# Patient Record
Sex: Female | Born: 1940
Health system: Southern US, Community
[De-identification: ages and names within clinical notes are randomized; demographics above are authoritative.]

## PROBLEM LIST (undated history)

## (undated) DIAGNOSIS — G8929 Other chronic pain: Secondary | ICD-10-CM

## (undated) DIAGNOSIS — N951 Menopausal and female climacteric states: Secondary | ICD-10-CM

## (undated) DIAGNOSIS — Z789 Other specified health status: Secondary | ICD-10-CM

## (undated) DIAGNOSIS — M25511 Pain in right shoulder: Secondary | ICD-10-CM

## (undated) DIAGNOSIS — M1990 Unspecified osteoarthritis, unspecified site: Secondary | ICD-10-CM

## (undated) DIAGNOSIS — M47812 Spondylosis without myelopathy or radiculopathy, cervical region: Secondary | ICD-10-CM

## (undated) DIAGNOSIS — N3281 Overactive bladder: Secondary | ICD-10-CM

## (undated) DIAGNOSIS — E785 Hyperlipidemia, unspecified: Secondary | ICD-10-CM

## (undated) DIAGNOSIS — I10 Essential (primary) hypertension: Secondary | ICD-10-CM

## (undated) DIAGNOSIS — K219 Gastro-esophageal reflux disease without esophagitis: Secondary | ICD-10-CM

## (undated) DIAGNOSIS — I8390 Asymptomatic varicose veins of unspecified lower extremity: Secondary | ICD-10-CM

## (undated) DIAGNOSIS — I69359 Hemiplegia and hemiparesis following cerebral infarction affecting unspecified side: Secondary | ICD-10-CM

## (undated) HISTORY — DX: Hyperlipidemia, unspecified: E78.5

## (undated) HISTORY — DX: Essential (primary) hypertension: I10

## (undated) HISTORY — DX: Hemiplegia and hemiparesis following cerebral infarction affecting unspecified side: I69.359

## (undated) HISTORY — PX: CATARACT EXTRACTION, BILATERAL: SHX1313

## (undated) HISTORY — DX: Menopausal and female climacteric states: N95.1

## (undated) HISTORY — DX: Overactive bladder: N32.81

## (undated) HISTORY — PX: BAND HEMORRHOIDECTOMY: SHX1213

## (undated) HISTORY — PX: CHOLECYSTECTOMY: SHX55

## (undated) HISTORY — PX: PAROTIDECTOMY: SHX2163

## (undated) HISTORY — PX: ABDOMINAL HYSTERECTOMY: SUR658

## (undated) HISTORY — DX: Other specified health status: Z78.9

---

## 1999-03-13 ENCOUNTER — Ambulatory Visit (HOSPITAL_COMMUNITY): Admission: RE | Admit: 1999-03-13 | Discharge: 1999-03-13 | Payer: Self-pay | Admitting: Obstetrics & Gynecology

## 2000-02-06 ENCOUNTER — Other Ambulatory Visit: Admission: RE | Admit: 2000-02-06 | Discharge: 2000-02-06 | Payer: Self-pay | Admitting: Obstetrics and Gynecology

## 2000-04-24 ENCOUNTER — Encounter: Admission: RE | Admit: 2000-04-24 | Discharge: 2000-04-24 | Payer: Self-pay | Admitting: Emergency Medicine

## 2000-04-24 ENCOUNTER — Encounter: Payer: Self-pay | Admitting: Emergency Medicine

## 2000-09-05 ENCOUNTER — Encounter: Payer: Self-pay | Admitting: Emergency Medicine

## 2000-09-05 ENCOUNTER — Emergency Department (HOSPITAL_COMMUNITY): Admission: EM | Admit: 2000-09-05 | Discharge: 2000-09-05 | Payer: Self-pay | Admitting: Emergency Medicine

## 2000-09-08 ENCOUNTER — Encounter: Admission: RE | Admit: 2000-09-08 | Discharge: 2000-09-08 | Payer: Self-pay | Admitting: Emergency Medicine

## 2000-09-08 ENCOUNTER — Encounter: Payer: Self-pay | Admitting: Emergency Medicine

## 2001-05-01 ENCOUNTER — Encounter: Admission: RE | Admit: 2001-05-01 | Discharge: 2001-05-01 | Payer: Self-pay | Admitting: Emergency Medicine

## 2001-05-01 ENCOUNTER — Encounter: Payer: Self-pay | Admitting: Emergency Medicine

## 2001-06-01 ENCOUNTER — Ambulatory Visit (HOSPITAL_BASED_OUTPATIENT_CLINIC_OR_DEPARTMENT_OTHER): Admission: RE | Admit: 2001-06-01 | Discharge: 2001-06-02 | Payer: Self-pay | Admitting: *Deleted

## 2002-03-09 ENCOUNTER — Other Ambulatory Visit: Admission: RE | Admit: 2002-03-09 | Discharge: 2002-03-09 | Payer: Self-pay | Admitting: Obstetrics and Gynecology

## 2002-04-02 ENCOUNTER — Encounter: Admission: RE | Admit: 2002-04-02 | Discharge: 2002-04-02 | Payer: Self-pay | Admitting: Emergency Medicine

## 2002-04-02 ENCOUNTER — Encounter: Payer: Self-pay | Admitting: Emergency Medicine

## 2002-05-04 ENCOUNTER — Encounter: Admission: RE | Admit: 2002-05-04 | Discharge: 2002-05-04 | Payer: Self-pay | Admitting: Emergency Medicine

## 2002-05-04 ENCOUNTER — Encounter: Payer: Self-pay | Admitting: Emergency Medicine

## 2002-11-18 LAB — HM COLONOSCOPY: HM Colonoscopy: NORMAL

## 2003-05-06 ENCOUNTER — Encounter: Payer: Self-pay | Admitting: Emergency Medicine

## 2003-05-06 ENCOUNTER — Encounter: Admission: RE | Admit: 2003-05-06 | Discharge: 2003-05-06 | Payer: Self-pay | Admitting: Emergency Medicine

## 2003-08-12 ENCOUNTER — Ambulatory Visit (HOSPITAL_COMMUNITY): Admission: RE | Admit: 2003-08-12 | Discharge: 2003-08-12 | Payer: Self-pay | Admitting: Gastroenterology

## 2003-11-28 ENCOUNTER — Encounter: Admission: RE | Admit: 2003-11-28 | Discharge: 2003-11-28 | Payer: Self-pay | Admitting: Emergency Medicine

## 2004-05-07 ENCOUNTER — Encounter: Admission: RE | Admit: 2004-05-07 | Discharge: 2004-05-07 | Payer: Self-pay | Admitting: Emergency Medicine

## 2005-05-08 ENCOUNTER — Encounter: Admission: RE | Admit: 2005-05-08 | Discharge: 2005-05-08 | Payer: Self-pay | Admitting: Emergency Medicine

## 2005-10-09 ENCOUNTER — Encounter: Admission: RE | Admit: 2005-10-09 | Discharge: 2005-10-09 | Payer: Self-pay | Admitting: Emergency Medicine

## 2006-05-09 ENCOUNTER — Encounter: Admission: RE | Admit: 2006-05-09 | Discharge: 2006-05-09 | Payer: Self-pay | Admitting: Emergency Medicine

## 2007-05-11 ENCOUNTER — Encounter: Admission: RE | Admit: 2007-05-11 | Discharge: 2007-05-11 | Payer: Self-pay | Admitting: Emergency Medicine

## 2008-05-11 ENCOUNTER — Encounter: Admission: RE | Admit: 2008-05-11 | Discharge: 2008-05-11 | Payer: Self-pay | Admitting: Emergency Medicine

## 2008-05-11 ENCOUNTER — Encounter: Payer: Self-pay | Admitting: Internal Medicine

## 2008-08-22 ENCOUNTER — Ambulatory Visit (HOSPITAL_COMMUNITY): Admission: RE | Admit: 2008-08-22 | Discharge: 2008-08-22 | Payer: Self-pay | Admitting: General Surgery

## 2008-11-28 ENCOUNTER — Ambulatory Visit: Payer: Self-pay | Admitting: Internal Medicine

## 2008-11-28 DIAGNOSIS — E7800 Pure hypercholesterolemia, unspecified: Secondary | ICD-10-CM | POA: Insufficient documentation

## 2008-11-28 DIAGNOSIS — N951 Menopausal and female climacteric states: Secondary | ICD-10-CM | POA: Insufficient documentation

## 2009-05-12 ENCOUNTER — Encounter: Admission: RE | Admit: 2009-05-12 | Discharge: 2009-05-12 | Payer: Self-pay | Admitting: Obstetrics and Gynecology

## 2009-05-15 ENCOUNTER — Encounter: Payer: Self-pay | Admitting: Internal Medicine

## 2009-05-16 ENCOUNTER — Ambulatory Visit: Payer: Self-pay | Admitting: Internal Medicine

## 2009-05-16 LAB — CONVERTED CEMR LAB
ALT: 18 units/L (ref 0–35)
AST: 31 units/L (ref 0–37)
Albumin: 4.5 g/dL (ref 3.5–5.2)
Alkaline Phosphatase: 55 units/L (ref 39–117)
BUN: 9 mg/dL (ref 6–23)
Basophils Absolute: 0 10*3/uL (ref 0.0–0.1)
Basophils Relative: 0.1 % (ref 0.0–3.0)
Bilirubin, Direct: 0.1 mg/dL (ref 0.0–0.3)
CO2: 31 meq/L (ref 19–32)
Calcium: 9.8 mg/dL (ref 8.4–10.5)
Chloride: 105 meq/L (ref 96–112)
Cholesterol: 168 mg/dL (ref 0–200)
Creatinine, Ser: 0.9 mg/dL (ref 0.4–1.2)
Eosinophils Absolute: 0.2 10*3/uL (ref 0.0–0.7)
Eosinophils Relative: 3.7 % (ref 0.0–5.0)
GFR calc non Af Amer: 79.61 mL/min (ref >60)
Glucose, Bld: 89 mg/dL (ref 70–99)
HCT: 41.2 % (ref 36.0–46.0)
HDL: 59.8 mg/dL (ref >39.00)
Hemoglobin: 14.3 g/dL (ref 12.0–15.0)
LDL Cholesterol: 98 mg/dL (ref 0–99)
Lymphocytes Relative: 41.4 % (ref 12.0–46.0)
Lymphs Abs: 1.7 10*3/uL (ref 0.7–4.0)
MCHC: 34.6 g/dL (ref 30.0–36.0)
MCV: 78.6 fL (ref 78.0–100.0)
Monocytes Absolute: 0.5 10*3/uL (ref 0.1–1.0)
Monocytes Relative: 10.8 % (ref 3.0–12.0)
Neutro Abs: 1.8 10*3/uL (ref 1.4–7.7)
Neutrophils Relative %: 44 % (ref 43.0–77.0)
Platelets: 197 10*3/uL (ref 150.0–400.0)
Potassium: 4.9 meq/L (ref 3.5–5.1)
RBC: 5.24 M/uL — ABNORMAL HIGH (ref 3.87–5.11)
RDW: 13.8 % (ref 11.5–14.6)
Sodium: 144 meq/L (ref 135–145)
TSH: 0.81 microintl units/mL (ref 0.35–5.50)
Total Bilirubin: 0.9 mg/dL (ref 0.3–1.2)
Total CHOL/HDL Ratio: 3
Total Protein: 7.3 g/dL (ref 6.0–8.3)
Triglycerides: 50 mg/dL (ref 0.0–149.0)
VLDL: 10 mg/dL (ref 0.0–40.0)
WBC: 4.2 10*3/uL — ABNORMAL LOW (ref 4.5–10.5)

## 2009-05-24 ENCOUNTER — Telehealth: Payer: Self-pay | Admitting: Internal Medicine

## 2009-08-14 ENCOUNTER — Encounter: Payer: Self-pay | Admitting: Internal Medicine

## 2009-09-15 ENCOUNTER — Ambulatory Visit: Payer: Self-pay | Admitting: Internal Medicine

## 2009-09-15 DIAGNOSIS — G4700 Insomnia, unspecified: Secondary | ICD-10-CM | POA: Insufficient documentation

## 2009-10-10 ENCOUNTER — Ambulatory Visit: Payer: Self-pay | Admitting: Internal Medicine

## 2009-10-10 DIAGNOSIS — I10 Essential (primary) hypertension: Secondary | ICD-10-CM | POA: Insufficient documentation

## 2010-01-25 ENCOUNTER — Ambulatory Visit: Payer: Self-pay | Admitting: Internal Medicine

## 2010-05-14 ENCOUNTER — Encounter: Admission: RE | Admit: 2010-05-14 | Discharge: 2010-05-14 | Payer: Self-pay | Admitting: Internal Medicine

## 2010-07-12 ENCOUNTER — Ambulatory Visit: Payer: Self-pay | Admitting: Internal Medicine

## 2010-07-12 LAB — CONVERTED CEMR LAB
ALT: 18 units/L (ref 0–35)
AST: 24 units/L (ref 0–37)
Albumin: 4.4 g/dL (ref 3.5–5.2)
Alkaline Phosphatase: 47 units/L (ref 39–117)
BUN: 16 mg/dL (ref 6–23)
Basophils Absolute: 0 10*3/uL (ref 0.0–0.1)
Basophils Relative: 0.4 % (ref 0.0–3.0)
Bilirubin, Direct: 0.1 mg/dL (ref 0.0–0.3)
CO2: 29 meq/L (ref 19–32)
Calcium: 9.9 mg/dL (ref 8.4–10.5)
Chloride: 104 meq/L (ref 96–112)
Cholesterol: 238 mg/dL — ABNORMAL HIGH (ref 0–200)
Creatinine, Ser: 0.8 mg/dL (ref 0.4–1.2)
Direct LDL: 165.5 mg/dL
Eosinophils Absolute: 0.2 10*3/uL (ref 0.0–0.7)
Eosinophils Relative: 3.3 % (ref 0.0–5.0)
GFR calc non Af Amer: 85.92 mL/min (ref >60)
Glucose, Bld: 91 mg/dL (ref 70–99)
HCT: 40.2 % (ref 36.0–46.0)
HDL: 60.1 mg/dL (ref >39.00)
Hemoglobin: 13.3 g/dL (ref 12.0–15.0)
Lymphocytes Relative: 33.1 % (ref 12.0–46.0)
Lymphs Abs: 2.2 10*3/uL (ref 0.7–4.0)
MCHC: 33 g/dL (ref 30.0–36.0)
MCV: 82.9 fL (ref 78.0–100.0)
Monocytes Absolute: 0.6 10*3/uL (ref 0.1–1.0)
Monocytes Relative: 8.7 % (ref 3.0–12.0)
Neutro Abs: 3.6 10*3/uL (ref 1.4–7.7)
Neutrophils Relative %: 54.5 % (ref 43.0–77.0)
Platelets: 199 10*3/uL (ref 150.0–400.0)
Potassium: 4 meq/L (ref 3.5–5.1)
RBC: 4.85 M/uL (ref 3.87–5.11)
RDW: 14.9 % — ABNORMAL HIGH (ref 11.5–14.6)
Sodium: 139 meq/L (ref 135–145)
TSH: 0.72 microintl units/mL (ref 0.35–5.50)
Total Bilirubin: 0.7 mg/dL (ref 0.3–1.2)
Total CHOL/HDL Ratio: 4
Total Protein: 6.8 g/dL (ref 6.0–8.3)
Triglycerides: 70 mg/dL (ref 0.0–149.0)
VLDL: 14 mg/dL (ref 0.0–40.0)
WBC: 6.6 10*3/uL (ref 4.5–10.5)

## 2010-08-09 ENCOUNTER — Ambulatory Visit (HOSPITAL_BASED_OUTPATIENT_CLINIC_OR_DEPARTMENT_OTHER): Admission: RE | Admit: 2010-08-09 | Discharge: 2010-08-09 | Payer: Self-pay | Admitting: Internal Medicine

## 2010-08-09 ENCOUNTER — Encounter: Payer: Self-pay | Admitting: Internal Medicine

## 2010-08-14 ENCOUNTER — Ambulatory Visit: Payer: Self-pay | Admitting: Pulmonary Disease

## 2010-08-17 LAB — HM PAP SMEAR: HM Pap smear: NORMAL

## 2010-09-26 ENCOUNTER — Telehealth: Payer: Self-pay | Admitting: Internal Medicine

## 2010-10-29 ENCOUNTER — Ambulatory Visit: Payer: Self-pay | Admitting: Internal Medicine

## 2010-10-29 DIAGNOSIS — M542 Cervicalgia: Secondary | ICD-10-CM | POA: Insufficient documentation

## 2010-11-26 ENCOUNTER — Ambulatory Visit
Admission: RE | Admit: 2010-11-26 | Discharge: 2010-11-26 | Payer: Self-pay | Source: Home / Self Care | Attending: Internal Medicine | Admitting: Internal Medicine

## 2010-11-26 DIAGNOSIS — J069 Acute upper respiratory infection, unspecified: Secondary | ICD-10-CM | POA: Insufficient documentation

## 2010-12-18 NOTE — Progress Notes (Signed)
Summary: Blood Pressure Readings taken at home  Blood Pressure Readings taken at home   Imported By: Maryln Gottron 01/29/2010 15:41:03  _____________________________________________________________________  External Attachment:    Type:   Image     Comment:   External Document

## 2010-12-18 NOTE — Progress Notes (Signed)
Summary: sleep study results  Phone Note Call from Patient   Caller: Patient Call For: Teresa Savers  MD Summary of Call: Pt is calling for lab and sleep study results. [phone] Initial call taken by: Lynann Beaver CMA AAMA,  September 26, 2010 10:08 AM  Follow-up for Phone Call        lab ok ; sleep study- very mild OSA only Follow-up by: Teresa Savers  MD,  September 26, 2010 11:50 AM  Additional Follow-up for Phone Call Additional follow up Details #1::        called pt - informe of sleep study results per Dr. Amador Cunas Additional Follow-up by: Duard Brady LPN,  September 26, 2010 12:28 PM

## 2010-12-18 NOTE — Assessment & Plan Note (Signed)
Summary: CPX (PT WILL COME IN FASTING) // RS/PT RSC/CJR   Vital Signs:  Martinez profile:   70 year old female Height:      65.5 inches Weight:      138 pounds BMI:     22.70 Temp:     98.2 degrees F oral BP sitting:   120 / 72  (left arm) Cuff size:   regular  Vitals Entered By: Kathrynn Speed CMA (July 12, 2010 8:50 AM) CC: cxp, fasting, src Is Martinez Diabetic? No   CC:  cxp, fasting, and src.  History of Present Illness: Teresa Martinez seen today for a comprehensive evaluation.  She is followed annually by gynecology.  She has a history of hypertension overactive bladder and mild dyslipidemia.  Her last colonoscopy was in 2005 ; she has had a recent mammogram.  Here for Medicare AWV:  1.   Risk factors based on Past M, S, F history:  cardiovascular risk factors include hypertension, and dyslipidemia. 2.   Physical Activities:  remains active without limitations 3.   Depression/mood: no history of depression or mood disorder 4.   Hearing: no deficits 5.   ADL's: completely active and independent in all aspects of daily living 6.   Fall Risk: low 7.   Home Safety: no problems  identifying 8.   Height, weight, &visual acuity:height and weight stable.  No difficulty with her visual acuity wears glasses 9.   Counseling: gynecologic follow-up encouraged 10.   Labs ordered based on risk factors: laboratory profile, including lipid panel will be reviewed 11.           Referral Coordination- GYN referral 12.           Care Plan- heart healthy diet more regular exercise regimen encouraged 13.            Cognitive Assessment- alert and oriented with normal affect.  No mood disorder, handles all her daily affairs, and no difficulty with executive function or memory.  No functional deficits   Current Medications (verified): 1)  Lisinopril-Hydrochlorothiazide 20-12.5 Mg Tabs (Lisinopril-Hydrochlorothiazide) .... One Daily 2)  Toviaz 4 Mg Xr24h-Tab (Fesoterodine Fumarate) ....  One Daily, As Needed  Allergies (verified): 1)  ! Latex Exam Gloves (Disposable Gloves)  Past History:  Past Medical History: Reviewed history from 01/25/2010 and no changes required. Hyperlipidemia statin intolerance menopausal syndrome Floyde Parkins) hypertension overactive bladder  Past Surgical History: Reviewed history from 05/16/2009 and no changes required. Cholecystectomy  1950 Hemorrhoidectomy -band ligation 2010 status post parotidectomy 30 years ago Colonoscopy 2005 Hysterectomy  1985  Family History: Reviewed history from 11/28/2008 and no changes required. father died age 44.  History congestive heart failure mother died age 65.  History diabetes, hypertension, cerebrovascular disease  Two sisters, one died from nonalcoholic cirrhosis two brothers, one with a hypercholesterolemia, and hypertension; s/p CVA  Social History: Reviewed history from 11/28/2008 and no changes required. Married Never Smoked Regular exercise-yes 4 children, 6 grandchildren one stepchild and two additional stepgrandchildren  Review of Systems  The Martinez denies anorexia, fever, weight loss, weight gain, vision loss, decreased hearing, hoarseness, chest pain, syncope, dyspnea on exertion, peripheral edema, prolonged cough, headaches, hemoptysis, abdominal pain, melena, hematochezia, severe indigestion/heartburn, hematuria, incontinence, genital sores, muscle weakness, suspicious skin lesions, transient blindness, difficulty walking, depression, unusual weight change, abnormal bleeding, enlarged lymph nodes, angioedema, and breast masses.    Physical Exam  General:  Well-developed,well-nourished,in no acute distress; alert,appropriate and cooperative throughout examination Head:  Normocephalic and atraumatic  without obvious abnormalities. No apparent alopecia or balding. Eyes:  No corneal or conjunctival inflammation noted. EOMI. Perrla. Funduscopic exam benign, without  hemorrhages, exudates or papilledema. Vision grossly normal. Ears:  External ear exam shows no significant lesions or deformities.  Otoscopic examination reveals clear canals, tympanic membranes are intact bilaterally without bulging, retraction, inflammation or discharge. Hearing is grossly normal bilaterally. Nose:  External nasal examination shows no deformity or inflammation. Nasal mucosa are pink and moist without lesions or exudates. Mouth:  Oral mucosa and oropharynx without lesions or exudates.  Teeth in good repair. Neck:  No deformities, masses, or tenderness noted. Chest Wall:  No deformities, masses, or tenderness noted. Breasts:  No mass, nodules, thickening, tenderness, bulging, retraction, inflamation, nipple discharge or skin changes noted.   Lungs:  Normal respiratory effort, chest expands symmetrically. Lungs are clear to auscultation, no crackles or wheezes. Heart:  Normal rate and regular rhythm. S1 and S2 normal without gallop, murmur, click, rub or other extra sounds. Abdomen:  Bowel sounds positive,abdomen soft and non-tender without masses, organomegaly or hernias noted. Msk:  No deformity or scoliosis noted of thoracic or lumbar spine.   Pulses:  the left dorsalis pedis pulse diminished Extremities:  No clubbing, cyanosis, edema, or deformity noted with normal full range of motion of all joints.   Neurologic:  No cranial nerve deficits noted. Station and gait are normal. Plantar reflexes are down-going bilaterally. DTRs are symmetrical throughout. Sensory, motor and coordinative functions appear intact. Skin:  Intact without suspicious lesions or rashes Cervical Nodes:  No lymphadenopathy noted Axillary Nodes:  No palpable lymphadenopathy Inguinal Nodes:  No significant adenopathy Psych:  Cognition and judgment appear intact. Alert and cooperative with normal attention span and concentration. No apparent delusions, illusions, hallucinations   Impression &  Recommendations:  Problem # 1:  PREVENTIVE HEALTH CARE (ICD-V70.0)  Orders: Medicare -1st Annual Wellness Visit [phone])  Complete Medication List: 1)  Lisinopril-hydrochlorothiazide 20-12.5 Mg Tabs (Lisinopril-hydrochlorothiazide) .... One daily 2)  Toviaz 4 Mg Xr24h-tab (Fesoterodine fumarate) .... One daily, as needed  Other Orders: Venipuncture (60454) TLB-Lipid Panel (80061-LIPID) TLB-BMP (Basic Metabolic Panel-BMET) (80048-METABOL) TLB-CBC Platelet - w/Differential (85025-CBCD) TLB-Hepatic/Liver Function Pnl (80076-HEPATIC) TLB-TSH (Thyroid Stimulating Hormone) (84443-TSH) Sleep Disorder Referral (Sleep Disorder) Specimen Handling (09811)  Martinez Instructions: 1)  Please schedule a follow-up appointment in 1 year. 2)  Limit your Sodium (Salt) to less than 2 grams a day(slightly less than 1/2 a teaspoon) to prevent fluid retention, swelling, or worsening of symptoms. 3)  It is important that you exercise regularly at least 20 minutes 5 times a week. If you develop chest pain, have severe difficulty breathing, or feel very tired , stop exercising immediately and seek medical attention. 4)  Take calcium +Vitamin D daily. 5)  Check your Blood Pressure regularly. If it is above: you should make an appointment. Prescriptions: TOVIAZ 4 MG XR24H-TAB (FESOTERODINE FUMARATE) one daily, as needed  #50 x 6   Entered and Authorized by:   Gordy Savers  MD   Signed by:   Gordy Savers  MD on 07/12/2010   Method used:   Print then Give to Martinez   RxID:   [id number] LISINOPRIL-HYDROCHLOROTHIAZIDE 20-12.5 MG TABS (LISINOPRIL-HYDROCHLOROTHIAZIDE) one daily  #90 x 6   Entered and Authorized by:   Gordy Savers  MD   Signed by:   Gordy Savers  MD on 07/12/2010   Method used:   Print then Give to Martinez   RxID:   [id number]

## 2010-12-18 NOTE — Assessment & Plan Note (Signed)
Summary: 3 month follow up re: bp meds/cjr   Vital Signs:  Patient profile:   70 year old female Weight:      143 pounds Temp:     98.8 degrees F oral BP sitting:   132 / 82  (right arm) Cuff size:   regular  Vitals Entered By: Duard Brady LPN (January 25, 2010 3:56 PM) CC: med review - bp monitoring , likes benicar/hct , and Gala Murdoch - needs rx for both    CC:  med review - bp monitoring , likes benicar/hct , and and Gala Murdoch - needs rx for both .  History of Present Illness: a 70 year old patient who is in today for follow-up of her hypertension.she is doing quite well without concerns or complaints.  She does have an overactive bladder syndrome and does quite well on p.r.n. medication.  No concerns or complaints today.  She denies any cardiopulmonary complaints  Allergies: 1)  ! Latex Exam Gloves (Disposable Gloves)  Past History:  Past Medical History: Hyperlipidemia statin intolerance menopausal syndrome Floyde Parkins) hypertension overactive bladder  Review of Systems  The patient denies anorexia, fever, weight loss, weight gain, vision loss, decreased hearing, hoarseness, chest pain, syncope, dyspnea on exertion, peripheral edema, prolonged cough, headaches, hemoptysis, abdominal pain, melena, hematochezia, severe indigestion/heartburn, hematuria, incontinence, genital sores, muscle weakness, suspicious skin lesions, transient blindness, difficulty walking, depression, unusual weight change, abnormal bleeding, enlarged lymph nodes, angioedema, and breast masses.    Physical Exam  General:  Well-developed,well-nourished,in no acute distress; alert,appropriate and cooperative throughout examination; 130/70 Head:  Normocephalic and atraumatic without obvious abnormalities. No apparent alopecia or balding. Eyes:  No corneal or conjunctival inflammation noted. EOMI. Perrla. Funduscopic exam benign, without hemorrhages, exudates or papilledema. Vision grossly  normal. Mouth:  Oral mucosa and oropharynx without lesions or exudates.  Teeth in good repair. Neck:  No deformities, masses, or tenderness noted. Lungs:  Normal respiratory effort, chest expands symmetrically. Lungs are clear to auscultation, no crackles or wheezes. Heart:  Normal rate and regular rhythm. S1 and S2 normal without gallop, murmur, click, rub or other extra sounds. Abdomen:  Bowel sounds positive,abdomen soft and non-tender without masses, organomegaly or hernias noted.   Impression & Recommendations:  Problem # 1:  HYPERTENSION NEC (ICD-997.91)  Problem # 2:  INSOMNIA (ICD-780.52)  Problem # 3:  HYPERLIPIDEMIA (ICD-272.4)  Complete Medication List: 1)  Lorazepam 0.5 Mg Tabs (Lorazepam) .... One twice daily as needed 2)  Lisinopril-hydrochlorothiazide 20-12.5 Mg Tabs (Lisinopril-hydrochlorothiazide) .... One daily 3)  Toviaz 4 Mg Xr24h-tab (Fesoterodine fumarate) .... One daily, as needed  Patient Instructions: 1)  Please schedule a follow-up appointment in 6 months for CPX 2)  Limit your Sodium (Salt). 3)  It is important that you exercise regularly at least 20 minutes 5 times a week. If you develop chest pain, have severe difficulty breathing, or feel very tired , stop exercising immediately and seek medical attention. 4)  Check your Blood Pressure regularly. If it is above: 150/90  you should make an appointment. Prescriptions: TOVIAZ 4 MG XR24H-TAB (FESOTERODINE FUMARATE) one daily, as needed  #50 x 4   Entered and Authorized by:   Gordy Savers  MD   Signed by:   Gordy Savers  MD on 01/25/2010   Method used:   Print then Give to Patient   RxID:   [id number] LISINOPRIL-HYDROCHLOROTHIAZIDE 20-12.5 MG TABS (LISINOPRIL-HYDROCHLOROTHIAZIDE) one daily  #90 x 4   Entered and Authorized by:   Gordy Savers  MD   Signed by:   Gordy Savers  MD on 01/25/2010   Method used:   Print then Give to Patient   RxID:   [id number] LORAZEPAM  0.5 MG TABS (LORAZEPAM) one twice daily as needed  #50 x 4   Entered and Authorized by:   Gordy Savers  MD   Signed by:   Gordy Savers  MD on 01/25/2010   Method used:   Print then Give to Patient   RxID:   [id number]
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
## 2010-12-20 NOTE — Assessment & Plan Note (Signed)
Summary: consult re: dry cough for over a wk and neck pain/cjr   Vital Signs:  Patient profile:   70 year old female Weight:      140 pounds Temp:     98.4 degrees F oral BP sitting:   140 / 80  (left arm) Cuff size:   regular  Vitals Entered By: Duard Brady LPN (November 26, 2010 1:13 PM) CC: cough x1wk , chest discomfort r/t cough - no fever Is Patient Diabetic? No   CC:  cough x1wk  and chest discomfort r/t cough - no fever.  History of Present Illness: 70 year old patient who presents with a one week history of dry, nonproductive cough, chest congestion, tightness, and over the past two days hoarseness.  There is been no shortness or breath, fever, chills, or purulent sputum production. Her posterior neck pain has improved  Allergies: 1)  ! Latex Exam Gloves (Disposable Gloves)  Past History:  Past Medical History: Reviewed history from 01/25/2010 and no changes required. Hyperlipidemia statin intolerance menopausal syndrome Teresa Martinez) hypertension overactive bladder  Review of Systems       The patient complains of anorexia, hoarseness, and prolonged cough.  The patient denies fever, weight loss, weight gain, vision loss, decreased hearing, chest pain, syncope, dyspnea on exertion, peripheral edema, headaches, hemoptysis, abdominal pain, melena, hematochezia, severe indigestion/heartburn, hematuria, incontinence, genital sores, muscle weakness, suspicious skin lesions, transient blindness, difficulty walking, depression, unusual weight change, abnormal bleeding, enlarged lymph nodes, angioedema, and breast masses.    Physical Exam  General:  Well-developed,well-nourished,in no acute distress; alert,appropriate and cooperative throughout examination Head:  Normocephalic and atraumatic without obvious abnormalities. No apparent alopecia or balding. Eyes:  No corneal or conjunctival inflammation noted. EOMI. Perrla. Funduscopic exam benign, without  hemorrhages, exudates or papilledema. Vision grossly normal. Ears:  right canal had some cerumen Nose:  External nasal examination shows no deformity or inflammation. Nasal mucosa are pink and moist without lesions or exudates. Mouth:  Oral mucosa and oropharynx without lesions or exudates.  Teeth in good repair. Neck:  No deformities, masses, or tenderness noted. Lungs:  Normal respiratory effort, chest expands symmetrically. Lungs are clear to auscultation, no crackles or wheezes. Heart:  Normal rate and regular rhythm. S1 and S2 normal without gallop, murmur, click, rub or other extra sounds.   Impression & Recommendations:  Problem # 1:  URI (ICD-465.9)  Problem # 2:  NECK PAIN, CHRONIC (ICD-723.1)  Her updated medication list for this problem includes:    Cyclobenzaprine Hcl 5 Mg Tabs (Cyclobenzaprine hcl) ..... One every 8 hours as needed for neck pain  Her updated medication list for this problem includes:    Cyclobenzaprine Hcl 5 Mg Tabs (Cyclobenzaprine hcl) ..... One every 8 hours as needed for neck pain  Complete Medication List: 1)  Lisinopril-hydrochlorothiazide 20-12.5 Mg Tabs (Lisinopril-hydrochlorothiazide) .... One daily 2)  Toviaz 4 Mg Xr24h-tab (Fesoterodine fumarate) .... One daily, as needed 3)  Advil  4)  Prednisone 10 Mg Tabs (Prednisone) .... One twice daily 5)  Cyclobenzaprine Hcl 5 Mg Tabs (Cyclobenzaprine hcl) .... One every 8 hours as needed for neck pain  Patient Instructions: 1)  Get plenty of rest, drink lots of clear liquids, and use Tylenol or Ibuprofen for fever and comfort. Return in 7-10 days if you're not better:sooner if you're feeling worse.   Orders Added: 1)  Est. Patient Level III [29562]

## 2011-01-11 ENCOUNTER — Telehealth: Payer: Self-pay | Admitting: Internal Medicine

## 2011-01-13 ENCOUNTER — Encounter: Payer: Self-pay | Admitting: Internal Medicine

## 2011-01-14 ENCOUNTER — Ambulatory Visit (INDEPENDENT_AMBULATORY_CARE_PROVIDER_SITE_OTHER): Payer: Medicare Other | Admitting: Internal Medicine

## 2011-01-14 ENCOUNTER — Encounter: Payer: Self-pay | Admitting: Internal Medicine

## 2011-01-14 VITALS — BP 110/70 | Temp 98.7°F | Wt 140.0 lb

## 2011-01-14 DIAGNOSIS — IMO0002 Reserved for concepts with insufficient information to code with codable children: Secondary | ICD-10-CM

## 2011-01-14 DIAGNOSIS — J40 Bronchitis, not specified as acute or chronic: Secondary | ICD-10-CM

## 2011-01-14 DIAGNOSIS — J069 Acute upper respiratory infection, unspecified: Secondary | ICD-10-CM

## 2011-01-14 MED ORDER — DOXYCYCLINE HYCLATE 100 MG PO TABS: 100.0000 mg | ORAL_TABLET | Freq: Two times a day (BID) | ORAL | Status: DC

## 2011-01-14 MED ORDER — HYDROCODONE-HOMATROPINE 5-1.5 MG/5ML PO SYRP: 5.0000 mL | ORAL_SOLUTION | Freq: Four times a day (QID) | ORAL | Status: DC | PRN

## 2011-01-14 MED ORDER — DOXYCYCLINE HYCLATE 100 MG PO TABS: 100.0000 mg | ORAL_TABLET | Freq: Two times a day (BID) | ORAL | Status: AC

## 2011-01-14 NOTE — Patient Instructions (Signed)
Get plenty of rest, Drink lots of  clear liquids, and use Tylenol or ibuprofen for fever and discomfort.    Take your antibiotic as prescribed until ALL of it is gone, but stop if you develop a rash, swelling, or any side effects of the medication.  Contact our office as soon as possible if  there are side effects of the medication.  Call or return to clinic prn if these symptoms worsen or fail to improve as anticipated.   

## 2011-01-14 NOTE — Progress Notes (Signed)
  Subjective:    Patient ID: Teresa Martinez, female    DOB: 23-Mar-1941, 70 y.o.   MRN: [id number]  HPI   70 year old patient who presents with a 10 day history of worsening head congestion chest congestion and productive cough. She has felt fatigued and unwell. For the past 3 days she has developed sore throat as well as right ear pain. She has been using a number of over the counter medications without much benefit. Sinus drainage has become quite purulent and is described as green and foul-smelling    Review of Systems  Constitutional: Positive for activity change and fatigue.  HENT: Positive for ear pain, congestion and rhinorrhea. Negative for hearing loss, sore throat, dental problem, sinus pressure and tinnitus.   Eyes: Negative for pain, discharge and visual disturbance.  Respiratory: Positive for cough. Negative for shortness of breath.   Cardiovascular: Negative for chest pain, palpitations and leg swelling.  Gastrointestinal: Negative for nausea, vomiting, abdominal pain, diarrhea, constipation, blood in stool and abdominal distention.  Genitourinary: Negative for dysuria, urgency, frequency, hematuria, flank pain, vaginal bleeding, vaginal discharge, difficulty urinating, vaginal pain and pelvic pain.  Musculoskeletal: Negative for joint swelling, arthralgias and gait problem.  Skin: Negative for rash.  Neurological: Negative for dizziness, syncope, speech difficulty, weakness, numbness and headaches.  Hematological: Negative for adenopathy.  Psychiatric/Behavioral: Negative for behavioral problems, dysphoric mood and agitation. The patient is not nervous/anxious.        Objective:   Physical Exam  Constitutional: She is oriented to person, place, and time. She appears well-developed and well-nourished.  HENT:  Head: Normocephalic.  Right Ear: External ear normal.  Left Ear: External ear normal.  Mouth/Throat: Oropharynx is clear and moist.        The right tympanic  membrane was not visualized secondary to cerumen. The left tympanic membrane was normal  Eyes: Conjunctivae and EOM are normal. Pupils are equal, round, and reactive to light.  Neck: Normal range of motion. Neck supple. No thyromegaly present.  Cardiovascular: Normal rate, regular rhythm, normal heart sounds and intact distal pulses.   Pulmonary/Chest: Effort normal and breath sounds normal.  Abdominal: Soft. Bowel sounds are normal. She exhibits no mass. There is no tenderness.  Musculoskeletal: Normal range of motion.  Lymphadenopathy:    She has no cervical adenopathy.  Neurological: She is alert and oriented to person, place, and time.  Skin: Skin is warm and dry. No rash noted.  Psychiatric: She has a normal mood and affect. Her behavior is normal.          Assessment & Plan:   acute sinusitis  Hypertension   We'll treat with 10 days of doxycycline therapy;  expectorants saline irrigation will be continued

## 2011-01-21 ENCOUNTER — Other Ambulatory Visit: Payer: Self-pay

## 2011-01-21 MED ORDER — HYDROCODONE-HOMATROPINE 5-1.5 MG/5ML PO SYRP: 5.0000 mL | ORAL_SOLUTION | Freq: Four times a day (QID) | ORAL | Status: AC | PRN

## 2011-01-21 NOTE — Telephone Encounter (Signed)
Faxed back to [facility]

## 2011-01-21 NOTE — Telephone Encounter (Signed)
recv'd fax from [facility] [address] For refill on hydromet   Seen in office 01/14/11 - was given at that time. Please advise

## 2011-02-25 ENCOUNTER — Other Ambulatory Visit: Payer: Self-pay | Admitting: Internal Medicine

## 2011-04-02 NOTE — Op Note (Signed)
NAME:  Teresa Martinez, Teresa Martinez      ACCOUNT NO.:  [id number]   MEDICAL RECORD NO.:  [id number]          PATIENT TYPE:  AMB   LOCATION:  SDS                          FACILITY:  [facility]   PHYSICIAN:  Ollen Gross. Vernell Morgans, M.D. DATE OF BIRTH:  13-Jun-1941   DATE OF PROCEDURE:  08/22/2008  DATE OF DISCHARGE:  08/22/2008                               OPERATIVE REPORT   PREOPERATIVE DIAGNOSIS:  Bleeding and internal hemorrhoids.   POSTOPERATIVE DIAGNOSIS:  Bleeding internal hemorrhoids.   PROCEDURE:  Internal hemorrhoid banding x2.   SURGEON:  Ollen Gross. Vernell Morgans, MD   ANESTHESIA:  General via LMA.   PROCEDURE:  After an informed consent was obtained, the patient was  brought to the operating placed in supine position on the operating room  table.  After adequate induction of general anesthesia, the patient was  placed in lithotomy position.  Her perirectal area was then prepped with  Betadine and draped in usual sterile manner.  The perirectal region was  then infiltrated with 0.25% Marcaine with epinephrine and 1 mL of Wydase  and the tissue was massaged gently for several minutes.  The patient had  a very patulous rectum with poor tone.  The bullet retractor was easily  inserted into the rectum.  The rectum was examined circumferentially.  There was a one larger internal hemorrhoid in the left posterior  position and a smaller internal hemorrhoid in the anterior position.  Each of these were banded without difficulty.  Care was taken to make  sure that the band was still deep to the dentate line and no other no  other abnormalities were noted.  Again, the rectum was infiltrated with  more 0.25% Marcaine bupivacaine ointment and a small piece of Gelfoam  inserted in the rectum and bupivacaine ointment was used to coat the  outside of the rectum.  Sterile dressings were then applied.  The  patient tolerated the procedure well.  At the end of case, all needle,  sponge, and instrument counts  correct.  The patient was then awakened  and taken to recovery room in stable condition.      Ollen Gross. Vernell Morgans, M.D.  Electronically Signed     PST/MEDQ  D:  08/22/2008  T:  08/23/2008  Job:  [id number]

## 2011-04-05 NOTE — H&P (Signed)
[place]  Patient:    Teresa Martinez, Teresa Martinez             MRN: [id number] Adm. Date:  [id number] Attending:  Harmon Pier                         History and Physical  PREOPERATIVE DIAGNOSIS:  Mandibular sagittal deficiency.  POSTOPERATIVE DIAGNOSIS:  Mandibular sagittal deficiency.  PROCEDURES PERFORMED:  Bilateral sagittal split osteotomy advancement with rigid fixation, KLS-Martin bone plate screws and use of autografter.  SURGEON:  Scott R. Egbert Garibaldi, D.D.S.  ASSISTANT:  Dora Sims, M.D.  ANESTHESIA:  General anesthesia via nasotracheal intubation.  FLUID REPLACEMENT:  600 cc.  ESTIMATED BLOOD LOSS:  150 cc.  URINE OUTPUT:  The had an in-and-out catheter at the end of the procedure.  MEDICATIONS GIVEN:  Ancef 1 g IV piggyback and Solu-Medrol 125 mg.  BRIEF OPERATIVE INDICATIONS:  The patient is a 70 year old black female referred by Dr. ______ and Dr. Allison Quarry for mandibular advancement.  The patient has a history of numerous missing.  She has had implants placed in teeth #19, #29 and #30 areas prior to orthotics.  These were used as anchorage for orthotics to retract her lower incisors.  The patient is now scheduled for bilateral sagittal split osteotomy advancement.  ALLERGIES:  No known drug allergies.  MEDICATIONS:  Lipitor and Premarin.  HOSPITALIZATIONS:  Hysterectomy in the past.  CLINICAL EXAM:  Severe class 2 skeletal profile.  Orthotic appliances were present.  Severe class 2 bony deficiency.  Implants at #19 #29 and #30 areas.  BRIEF DESCRIPTION OF OPERATIVE TECHNIQUE:  The patient was brought to the day surgery center and placed in the supine position.  The appropriate monitors were attached and then general anesthesia was induced via IV and inhalation techniques and right nasotracheal intubation without complication.  The patient was given 1 g of Ancef prior to the procedure and Solu-Medrol 125 mg. At this  point, the oral cavity was then suctioned and a throat pack was placed.  A total of 6 cc of 2% Xylocaine with 1:100,000 epinephrine was given bilateral to the inferior alveolar, long buccal and superior alveolar areas and a long buccal injection was given to the right and left mandible.  At this point, a bite block was placed on the patients left side and, using a #15 blade, an incision was made in the lateral unattached tissue region of the mandible inferior to the buccal flap pad reflection approximately 2 cm long going out towards the cheek area.  This was done down to bone.  A periosteal elevator was used to dissect the bone down to the periosteum in this region. A coronoid retractor stripper was sued to strip the tendon off the temporalis region.  Using a #15 periosteal blade, the tendon was released from the temporalis region.  Medial dissection was then done to dissect going toward the medial portion of the mandible to expose the sigmoid notch.  Using a curved curet, the sigmoid notch was then identified.  Using a Seldin retractor, the periosteum and lingula were then protected where the nerve entered the canal and, using a reciprocating saw, a cut 2 mm superior to the lingula was then made for the length of the mandible down through cortical bone to medullary bone.  At this point, using a #71 taper fissure bur, several potholes were placed going down the ascending ramus towards the first molar region  at the external oblique ridge.  This was split to make a long portion of the sagittal split due to her planned advancement of about 10 mm on the patients right side.  This was done down to the cortex.  At this point, the self retaining retractor was released from the coronary process and a channel retractor was then placed on the patients right side posterior to the implants.  Using a #71 tapered fissure bur, a vertical cut was then made and a probe was done under copious saline  irrigation through the buccal cortices to connect the horizontal osteotomy to the vertical osteotomy.  The inferior portion of the mandible was cut all the way through using copious amount of saline irrigation.  At this point, the osteotomy cuts were all checked with a #71 tapered fissure but to make sure that they were into appropriate medullary bone.  Using a thin, straight osteotome parallel to the buccal surface of the mandible, osteotoming was then done for the length of the osteotomy site.  The next sized osteotomy was then used in an appropriate fashion.  There was no evidence of splaying in the mandible at this point.  Using a Smith spreader in the ascending portion of the ramus and a Smith spreader in  the inferior portion of the ramus, the mandible was then noted to fracture in the appropriate position.  During this whole procedure, an autograft was used and bone had been harvested along the whole time of this procedure.  There was noted a small segment of bone at the angle of the mandible.  This was retrieved and put in normal saline for later on for use as a bone graft.  The nerve was found to be in the distal segment as expected and to be intact.  At this point, a J-freer was used to free the medial pterygoid off the distal segment and a moist throat gauze was placed on the patients right side.  At this point, the bite block was placed on the patients right side and attention was drawn to the patients left side.  An incision out toward the cheek in a similar fashion approximately 2 cm long was once again done down to bone.  Using a periosteal elevator, the periosteum was then freed going toward the coronoid process.  A coronoid stripper was then used to strip the temporalis tendon off this region.  A self retaining retractor was then placed.  Using a periosteal elevator, dissection superiorly toward the sigmoid notch was then done.  Using a curved dental curet, the sigmoid notch  was identified.  Using s Seldin retractor, the periosteum was pushed inferiorly to 2 mm above the lingula once again.  The reciprocating saw was then used once  again to saw through the medial cortex of the mandible and going through the medial portion of the cortical bone.  Once again, a #71 tapered fissure bur was used to make potholes going down the ascending ramus parallel to the buccal cortex down toward the first molar region.  These were all connected appropriately and then a channel retractor was placed and the self retaining retractor was removed.  Using the channel retractor, a vertical osteotomy was then done through to the buccal cortex down to the medullary bone.  Once again, the autograft was used.  Using a thin, straight osteotomy, this was used to mallet all the way through the osteotomy site for the length of the site.  Using the next size, osteotoming was  then done appropriately.  The mandible was noted to splay at this point.  Using a Smith spreader in the ascending portion of the ramus and a Market researcher, the mandible was then found to osteotomize and split in the appropriate position.  Once again, the nerve was in the distal segment as expected.  There was no compression on the nerve.  A J-freer was then used to free the medial pterygoid from the distal segment.  The mandible was then found to be freely mobile in the distal segment and normal saline irrigation followed.  At this point, a predetermined splint was then tried in, and this fit appropriately.  Terminal ileum was noted that, since there was significant advancement on the patients right side, that the distal segment of the bone was going to occlude on the remaining molar on the patients right side.  It was felt by and Dr. Monia Pouch at this point to remove a little bit of bone on the distal segment using a pear-shaped bur and a copious amount of saline irrigation, and also to shave down tooth #2 area due to the  fact that it was occluding against the bone of the mandible.  This was done under a copious amount of saline irrigation to feel that the mandible was then appropriately free of occlusion from that second molar.  At this point, it was noted on the patients right side that there was some splaying of the proximal distal segment due to the asymmetric nature of the osteotomy.  Under a copious amount of saline irrigation, the osteotomy site was inspected, and it was felt that some osseus reduction of the bulbous portion of the inferior portion of the mandibular nerve on the distal segment could be done.  This was done with a #8 bur and also a pear-shaped bur.  This was done gingerly such that the segment would then lie more passively.  Once again, due to the asymmetric nature of this move, we had to position the segment as fast as possible. The segment was then positioned. The proximal segment was positioned a little bit inferior as opposed to the ideal position, and it was elected to do percutaneous puncture and placement of three screws.  Due to the perpendicular nature of the screws, it was felt that this would hold the mandible more stable.  A #15 blade was then used in the submandibular region for a small puncture and a trocar was placed and holes were drilled x 3 into the bone and the KLS-Martin 2.0 screw system  x 15 x 3 were placed in the proximal and distal overlap sites.  At this point, the oral cavity was then suctioned and the patients left side was inspected. Prior to placement of the screws, the patient was placed into intermaxillary fixation with a 26-gauge wire with a predetermined splint to the occlusal splint as mentioned.  At this point, the patients left side was then addressed and a bone clamp was then used to place the proximal distal segment while the proximal segment was superiorly positioned into the glenoid fossa. At this point, direct visualization was then possible and  three #15 KLS-Martin screws were placed using a rotary instrument.  These were placed without complication.  At this point, the oral cavity was suctioned.  The INF was removed.  The patient was noted to rotate freely into the splint.  It was noted that the patients left side #15 tooth and posterior implant had a small 1 mm discrepancy which  was found to be acceptable since the implant crown was going to be redone anyway.  This was a temporary crown.  The bone graft material that was previously harvested was then used and placed into the patients right side in the osteotomy site to fill any defects between the proximal distal segment and the screws.  The oral cavity was then suctioned and a 3-0 Vicryl subcutaneous interlocking suture was done on the patients right and left side.  A 6-0 nylon was placed in the patients face in a horizontal mattress suture-type fashion.  At this point, it was elected to leave the splint out and the patient was noted to rotate freely into the splint as previously described.  The patient was then transferred to the recovery room with vital signs stable in a sedated state. DD:  06/01/01 TD:  06/01/01 Job: [id number]

## 2011-04-05 NOTE — Op Note (Signed)
NAME:  Teresa Martinez, Teresa Martinez                ACCOUNT NO.:  [id number]   MEDICAL RECORD NO.:  [id number]                   PATIENT TYPE:  AMB   LOCATION:  ENDO                                 FACILITY:  [facility]   PHYSICIAN:  Anselmo Rod, M.D.               DATE OF BIRTH:  [birth year]   DATE OF PROCEDURE:  08/12/2003  DATE OF DISCHARGE:                                 OPERATIVE REPORT   PROCEDURE:  Screening colonoscopy.   ENDOSCOPIST:  Anselmo Rod, M.D.   INSTRUMENT USED:  Olympus video colonoscope.   INDICATIONS FOR PROCEDURE:  A 70 year old [place] American female with a  history of bright red blood per rectum,  rule out colonic polyps, masses,  etc.   PREPROCEDURE PREPARATION:  Informed consent was obtained from the patient  and the patient was  fasted for 8 hours prior to  the procedure and prepped  with a bottle of magnesium citrate and a gallon of GoLYTELY the  night prior  to the procedure.   PREPROCEDURE PHYSICAL:  The patient had stable vital signs. Neck supple.  Chest clear to auscultation, S1, S2 regular. Abdomen soft with normoactive  bowel sounds.   DESCRIPTION OF PROCEDURE:  The patient was placed in the left lateral  decubitus position and sedated with 50 mg of Demerol and 5 mg of Versed  intravenously. Once sedation was adequate, the patient was maintained on low  flow oxygen and continuous cardiac monitoring.   The Olympus video colonoscope was advanced from the rectum to the cecum with  extreme difficulty. The patient's position  had to be changed from the left  lateral  to the supine, right lateral  and prone position with application  of abdominal pressure under 2 locations to reach the cecal base. The  appendiceal orifice and ileocecal valve were visualized and photographed.  No masses, polyps, erosions or ulcerations or diverticula were seen.  Moderate sized internal hemorrhoids were seen on retroflexion, which I  suspect are the cause  of the  patient's rectal bleeding. The patient  tolerated the procedure well without complications.   IMPRESSION:  1. Very tortuous colon, no masses or polyps seen.  2. Small internal hemorrhoids seen on retroflexion.   RECOMMENDATIONS:  1. Continue a high fiber diet with liberal fluid intake.  2.     Outpatient follow up in the next 2 weeks for further recommendations.  3. Repeat colorectal cancer screening in the next 10 years unless the     patient develops any abnormal symptoms in the interim.                                               Anselmo Rod, M.D.    JNM/MEDQ  D:  08/12/2003  T:  08/13/2003  Job:  [id number]   cc:   Onalee Hua  Vivia Budge, M.D.  [address]  [place]  [place] [address]  Fax: [phone]

## 2011-04-19 LAB — HM MAMMOGRAPHY: HM Mammogram: NEGATIVE

## 2011-04-25 ENCOUNTER — Other Ambulatory Visit: Payer: Self-pay | Admitting: Obstetrics and Gynecology

## 2011-04-25 DIAGNOSIS — Z1231 Encounter for screening mammogram for malignant neoplasm of breast: Secondary | ICD-10-CM

## 2011-05-16 ENCOUNTER — Ambulatory Visit
Admission: RE | Admit: 2011-05-16 | Discharge: 2011-05-16 | Disposition: A | Discharge status: Home / Self Care | Payer: Medicare Other | Source: Ambulatory Visit | Attending: Obstetrics and Gynecology | Admitting: Obstetrics and Gynecology

## 2011-05-16 DIAGNOSIS — Z1231 Encounter for screening mammogram for malignant neoplasm of breast: Secondary | ICD-10-CM

## 2011-07-12 NOTE — Telephone Encounter (Signed)
Chart opened in error

## 2011-07-19 ENCOUNTER — Ambulatory Visit (INDEPENDENT_AMBULATORY_CARE_PROVIDER_SITE_OTHER): Payer: Medicare Other | Admitting: Internal Medicine

## 2011-07-19 ENCOUNTER — Other Ambulatory Visit: Payer: Self-pay | Admitting: Internal Medicine

## 2011-07-19 ENCOUNTER — Encounter: Payer: Self-pay | Admitting: Internal Medicine

## 2011-07-19 ENCOUNTER — Ambulatory Visit (INDEPENDENT_AMBULATORY_CARE_PROVIDER_SITE_OTHER)
Admission: RE | Admit: 2011-07-19 | Discharge: 2011-07-19 | Disposition: A | Discharge status: Home / Self Care | Payer: Medicare Other | Source: Ambulatory Visit | Attending: Internal Medicine | Admitting: Internal Medicine

## 2011-07-19 DIAGNOSIS — I10 Essential (primary) hypertension: Secondary | ICD-10-CM

## 2011-07-19 DIAGNOSIS — M542 Cervicalgia: Secondary | ICD-10-CM

## 2011-07-19 DIAGNOSIS — Z0000 Encounter for general adult medical examination without abnormal findings: Secondary | ICD-10-CM

## 2011-07-19 DIAGNOSIS — IMO0002 Reserved for concepts with insufficient information to code with codable children: Secondary | ICD-10-CM

## 2011-07-19 DIAGNOSIS — E785 Hyperlipidemia, unspecified: Secondary | ICD-10-CM

## 2011-07-19 DIAGNOSIS — Z23 Encounter for immunization: Secondary | ICD-10-CM

## 2011-07-19 DIAGNOSIS — G8929 Other chronic pain: Secondary | ICD-10-CM

## 2011-07-19 LAB — CBC WITH DIFFERENTIAL/PLATELET
Basophils Absolute: 0 10*3/uL (ref 0.0–0.1)
Basophils Relative: 0.4 % (ref 0.0–3.0)
Eosinophils Absolute: 0.6 10*3/uL (ref 0.0–0.7)
Eosinophils Relative: 10.9 % — ABNORMAL HIGH (ref 0.0–5.0)
HCT: 42.7 % (ref 36.0–46.0)
Hemoglobin: 13.8 g/dL (ref 12.0–15.0)
Lymphocytes Relative: 39.2 % (ref 12.0–46.0)
Lymphs Abs: 2.3 10*3/uL (ref 0.7–4.0)
MCHC: 32.3 g/dL (ref 30.0–36.0)
MCV: 82 fl (ref 78.0–100.0)
Monocytes Absolute: 0.7 10*3/uL (ref 0.1–1.0)
Monocytes Relative: 12 % (ref 3.0–12.0)
Neutro Abs: 2.2 10*3/uL (ref 1.4–7.7)
Neutrophils Relative %: 37.5 % — ABNORMAL LOW (ref 43.0–77.0)
Platelets: 226 10*3/uL (ref 150.0–400.0)
RBC: 5.21 Mil/uL — ABNORMAL HIGH (ref 3.87–5.11)
RDW: 15.3 % — ABNORMAL HIGH (ref 11.5–14.6)
WBC: 5.9 10*3/uL (ref 4.5–10.5)

## 2011-07-19 LAB — LIPID PANEL
Cholesterol: 291 mg/dL — ABNORMAL HIGH (ref 0–200)
HDL: 61.3 mg/dL (ref >39.00)
Total CHOL/HDL Ratio: 5
Triglycerides: 76 mg/dL (ref 0.0–149.0)
VLDL: 15.2 mg/dL (ref 0.0–40.0)

## 2011-07-19 LAB — HEPATIC FUNCTION PANEL
ALT: 16 U/L (ref 0–35)
AST: 27 U/L (ref 0–37)
Albumin: 4.4 g/dL (ref 3.5–5.2)
Alkaline Phosphatase: 55 U/L (ref 39–117)
Bilirubin, Direct: 0.1 mg/dL (ref 0.0–0.3)
Total Bilirubin: 0.6 mg/dL (ref 0.3–1.2)
Total Protein: 7.3 g/dL (ref 6.0–8.3)

## 2011-07-19 LAB — TSH: TSH: 0.86 u[IU]/mL (ref 0.35–5.50)

## 2011-07-19 LAB — BASIC METABOLIC PANEL
BUN: 16 mg/dL (ref 6–23)
CO2: 29 mEq/L (ref 19–32)
Calcium: 9.6 mg/dL (ref 8.4–10.5)
Chloride: 105 mEq/L (ref 96–112)
Creatinine, Ser: 0.9 mg/dL (ref 0.4–1.2)
GFR: 81.2 mL/min (ref >60.00)
Glucose, Bld: 94 mg/dL (ref 70–99)
Potassium: 5.1 mEq/L (ref 3.5–5.1)
Sodium: 141 mEq/L (ref 135–145)

## 2011-07-19 LAB — LDL CHOLESTEROL, DIRECT: Direct LDL: 229.6 mg/dL

## 2011-07-19 MED ORDER — CYCLOBENZAPRINE HCL 5 MG PO TABS: 5.0000 mg | ORAL_TABLET | Freq: Two times a day (BID) | ORAL | Status: DC | PRN

## 2011-07-19 MED ORDER — LISINOPRIL-HYDROCHLOROTHIAZIDE 20-12.5 MG PO TABS: 1.0000 | ORAL_TABLET | Freq: Every day | ORAL | Status: DC

## 2011-07-19 MED ORDER — SIMVASTATIN 40 MG PO TABS: 40.0000 mg | ORAL_TABLET | Freq: Every evening | ORAL | Status: DC

## 2011-07-19 MED ORDER — FESOTERODINE FUMARATE ER 4 MG PO TB24: 4.0000 mg | ORAL_TABLET | Freq: Every day | ORAL | Status: DC

## 2011-07-19 NOTE — Progress Notes (Signed)
Subjective:    Patient ID: Teresa Martinez, female    DOB: 01/15/1941, 70 y.o.   MRN: [id number]  HPI History of Present Illness:   70 year-old patient seen today for a comprehensive evaluation. She is followed annually by gynecology. She has a history of hypertension overactive bladder and mild dyslipidemia. Her last colonoscopy was in 2005 ; she has had a recent mammogram. She continues to be followed by gynecology. She is status post remote hysterectomy. Her main complaint today is chronic neck pain for several years duration.  Here for Medicare AWV:   1. Risk factors based on Past M, S, F history: cardiovascular risk factors include hypertension, and dyslipidemia.  2. Physical Activities: remains active without limitations; jogs almost daily and even participates in races 3. Depression/mood: no history of depression or mood disorder  4. Hearing: no deficits  5. ADL's: completely active and independent in all aspects of daily living  6. Fall Risk: low  7. Home Safety: no problems identifying  8. Height, weight, &visual acuity:height and weight stable. No difficulty with her visual acuity wears glasses  9. Counseling: gynecologic follow-up encouraged  10. Labs ordered based on risk factors: laboratory profile, including lipid panel will be reviewed  11. Referral Coordination- GYN referral  12. Care Plan- heart healthy diet more regular exercise regimen encouraged  13. Cognitive Assessment- alert and oriented with normal affect. No mood disorder, handles all her daily affairs, and no difficulty with executive function or memory. No functional deficits   Current Medications (verified):  1) Lisinopril-Hydrochlorothiazide 20-12.5 Mg Tabs (Lisinopril-Hydrochlorothiazide) .... One Daily  2) Toviaz 4 Mg Xr24h-Tab (Fesoterodine Fumarate) .... One Daily, As Needed   Allergies (verified):  1) ! Latex Exam Gloves (Disposable Gloves)   Past History:  Past Medical History:  Reviewed  history from 01/25/2010 and no changes required.  Hyperlipidemia  statin intolerance  menopausal syndrome Floyde Parkins)  hypertension  overactive bladder  Past Surgical History:  Reviewed history from 05/16/2009 and no changes required.  Cholecystectomy 1950  Hemorrhoidectomy -band ligation 2010  status post parotidectomy 30 years ago  Colonoscopy 2005  Hysterectomy 1985   Family History:  Reviewed history from 11/28/2008 and no changes required.  father died age 58. History congestive heart failure  mother died age 21. History diabetes, hypertension, cerebrovascular disease  Two sisters, one died from nonalcoholic cirrhosis  two brothers, one with a hypercholesterolemia, and hypertension; s/p CVA    Review of Systems  Constitutional: Negative for fever, appetite change, fatigue and unexpected weight change.  HENT: Negative for hearing loss, ear pain, nosebleeds, congestion, sore throat, mouth sores, trouble swallowing, neck stiffness, dental problem, voice change, sinus pressure and tinnitus.   Eyes: Negative for photophobia, pain, redness and visual disturbance.  Respiratory: Negative for cough, chest tightness and shortness of breath.   Cardiovascular: Negative for chest pain, palpitations and leg swelling.  Gastrointestinal: Negative for nausea, vomiting, abdominal pain, diarrhea, constipation, blood in stool, abdominal distention and rectal pain.  Genitourinary: Negative for dysuria, urgency, frequency, hematuria, flank pain, vaginal bleeding, vaginal discharge, difficulty urinating, genital sores, vaginal pain, menstrual problem and pelvic pain.  Musculoskeletal: Negative for back pain and arthralgias.       Chronic posterior neck pain that interferes with sleep  Skin: Negative for rash.  Neurological: Negative for dizziness, syncope, speech difficulty, weakness, light-headedness, numbness and headaches.  Hematological: Negative for adenopathy. Does not bruise/bleed  easily.  Psychiatric/Behavioral: Negative for suicidal ideas, behavioral problems, self-injury, dysphoric mood and agitation.  The patient is not nervous/anxious.        Objective:   Physical Exam  Constitutional: She is oriented to person, place, and time. She appears well-developed and well-nourished.  HENT:  Head: Normocephalic and atraumatic.  Right Ear: External ear normal.  Left Ear: External ear normal.  Mouth/Throat: Oropharynx is clear and moist.  Eyes: Conjunctivae and EOM are normal.  Neck: Normal range of motion. Neck supple. No JVD present. No thyromegaly present.  Cardiovascular: Normal rate, regular rhythm, normal heart sounds and intact distal pulses.   No murmur heard.      Diminished left dorsalis pedis pulse  Pulmonary/Chest: Effort normal and breath sounds normal. She has no wheezes. She has no rales.  Abdominal: Soft. Bowel sounds are normal. She exhibits no distension and no mass. There is no tenderness. There is no rebound and no guarding.  Musculoskeletal: Normal range of motion. She exhibits no edema and no tenderness.  Neurological: She is alert and oriented to person, place, and time. She has normal reflexes. No cranial nerve deficit. She exhibits normal muscle tone. Coordination normal.  Skin: Skin is warm and dry. No rash noted.  Psychiatric: She has a normal mood and affect. Her behavior is normal.          Assessment & Plan:   Preventive health care Chronic posterior neck pain. Will check C-spine x-rays Laboratory studies  Reviewed Recheck 6 months Exercise low salt diet encouraged

## 2011-07-19 NOTE — Patient Instructions (Signed)
Limit your sodium (Salt) intake  Please check your blood pressure on a regular basis.  If it is consistently greater than 150/90, please make an office appointment.     It is important that you exercise regularly, at least 20 minutes 3 to 4 times per week.  If you develop chest pain or shortness of breath seek  medical attention. 

## 2011-07-19 NOTE — Progress Notes (Signed)
Quick Note:  Spoke with pt - informed of labs and med to start . ______

## 2011-07-19 NOTE — Progress Notes (Signed)
Quick Note:  Spoke with pt - informed of results - she does wish to do referral. ______

## 2011-07-23 ENCOUNTER — Telehealth: Payer: Self-pay | Admitting: Speech Pathology

## 2011-07-23 NOTE — Telephone Encounter (Signed)
Patient is requesting a call from you.  She stated she is not feeling well.

## 2011-07-23 NOTE — Telephone Encounter (Signed)
Attempt to call -VM - LMTCB

## 2011-07-24 ENCOUNTER — Telehealth: Payer: Self-pay | Admitting: Internal Medicine

## 2011-07-24 NOTE — Telephone Encounter (Signed)
Wants Kim to return her call. Pt refused to leave any additional info. Thanks.

## 2011-07-25 NOTE — Telephone Encounter (Signed)
Pt was requesting other lab values- discussed

## 2011-08-02 ENCOUNTER — Other Ambulatory Visit: Payer: Self-pay | Admitting: Neurosurgery

## 2011-08-02 DIAGNOSIS — M542 Cervicalgia: Secondary | ICD-10-CM

## 2011-08-12 ENCOUNTER — Other Ambulatory Visit: Payer: Medicare Other

## 2011-08-13 ENCOUNTER — Ambulatory Visit
Admission: RE | Admit: 2011-08-13 | Discharge: 2011-08-13 | Disposition: A | Discharge status: Home / Self Care | Payer: Medicare Other | Source: Ambulatory Visit | Attending: Neurosurgery | Admitting: Neurosurgery

## 2011-08-13 DIAGNOSIS — M542 Cervicalgia: Secondary | ICD-10-CM

## 2011-08-19 LAB — CBC
HCT: 42.5
Hemoglobin: 13.9
MCHC: 32.6
MCV: 81
Platelets: 208
RBC: 5.24 — ABNORMAL HIGH
RDW: 14.5
WBC: 5.5

## 2012-03-02 ENCOUNTER — Ambulatory Visit (INDEPENDENT_AMBULATORY_CARE_PROVIDER_SITE_OTHER): Payer: Medicare Other | Admitting: Internal Medicine

## 2012-03-02 ENCOUNTER — Encounter: Payer: Self-pay | Admitting: Internal Medicine

## 2012-03-02 VITALS — BP 100/70 | Temp 98.4°F | Wt 137.0 lb

## 2012-03-02 DIAGNOSIS — I10 Essential (primary) hypertension: Secondary | ICD-10-CM

## 2012-03-02 DIAGNOSIS — IMO0002 Reserved for concepts with insufficient information to code with codable children: Secondary | ICD-10-CM

## 2012-03-02 DIAGNOSIS — J069 Acute upper respiratory infection, unspecified: Secondary | ICD-10-CM

## 2012-03-02 MED ORDER — HYDROCODONE-HOMATROPINE 5-1.5 MG/5ML PO SYRP: 5.0000 mL | ORAL_SOLUTION | Freq: Four times a day (QID) | ORAL | Status: DC | PRN

## 2012-03-02 NOTE — Progress Notes (Signed)
  Subjective:    Patient ID: Teresa Martinez, female    DOB: 08/15/1941, 71 y.o.   MRN: [id number]  HPI  71 year old patient who is seen today for followup. She has treated hypertension and dyslipidemia. She also is a history of overactive bladder. The past 7-10 days she has had mildly productive cough she has had malaise and a general sense of unwellness there's been no fever chest pain shortness of breath or wheezing.    Review of Systems  Constitutional: Negative.   HENT: Positive for congestion, rhinorrhea and postnasal drip. Negative for hearing loss, sore throat, dental problem, sinus pressure and tinnitus.   Eyes: Negative for pain, discharge and visual disturbance.  Respiratory: Positive for cough. Negative for shortness of breath.   Cardiovascular: Negative for chest pain, palpitations and leg swelling.  Gastrointestinal: Negative for nausea, vomiting, abdominal pain, diarrhea, constipation, blood in stool and abdominal distention.  Genitourinary: Negative for dysuria, urgency, frequency, hematuria, flank pain, vaginal bleeding, vaginal discharge, difficulty urinating, vaginal pain and pelvic pain.  Musculoskeletal: Negative for joint swelling, arthralgias and gait problem.  Skin: Negative for rash.  Neurological: Negative for dizziness, syncope, speech difficulty, weakness, numbness and headaches.  Hematological: Negative for adenopathy.  Psychiatric/Behavioral: Negative for behavioral problems, dysphoric mood and agitation. The patient is not nervous/anxious.        Objective:   Physical Exam  Constitutional: She is oriented to person, place, and time. She appears well-developed and well-nourished.  HENT:  Head: Normocephalic.  Right Ear: External ear normal.  Left Ear: External ear normal.  Mouth/Throat: Oropharynx is clear and moist.  Eyes: Conjunctivae and EOM are normal. Pupils are equal, round, and reactive to light.  Neck: Normal range of motion. Neck supple.  No thyromegaly present.  Cardiovascular: Normal rate, regular rhythm, normal heart sounds and intact distal pulses.   Pulmonary/Chest: Effort normal and breath sounds normal.  Abdominal: Soft. Bowel sounds are normal. She exhibits no mass. There is no tenderness.  Musculoskeletal: Normal range of motion.  Lymphadenopathy:    She has no cervical adenopathy.  Neurological: She is alert and oriented to person, place, and time.  Skin: Skin is warm and dry. No rash noted.  Psychiatric: She has a normal mood and affect. Her behavior is normal.          Assessment & Plan:   Viral URI. Will treat symptomatically Hypertension stable Dyslipidemia. Will continue simvastatin

## 2012-03-02 NOTE — Patient Instructions (Signed)
Get plenty of rest, Drink lots of  clear liquids, and use Tylenol or ibuprofen for fever and discomfort.    Call or return to clinic prn if these symptoms worsen or fail to improve as anticipated.  

## 2012-03-09 ENCOUNTER — Ambulatory Visit (INDEPENDENT_AMBULATORY_CARE_PROVIDER_SITE_OTHER): Payer: Medicare Other | Admitting: Internal Medicine

## 2012-03-09 ENCOUNTER — Encounter: Payer: Self-pay | Admitting: Internal Medicine

## 2012-03-09 VITALS — BP 118/80 | Temp 99.0°F | Wt 136.0 lb

## 2012-03-09 DIAGNOSIS — J069 Acute upper respiratory infection, unspecified: Secondary | ICD-10-CM

## 2012-03-09 DIAGNOSIS — I10 Essential (primary) hypertension: Secondary | ICD-10-CM

## 2012-03-09 MED ORDER — HYDROCODONE-HOMATROPINE 5-1.5 MG/5ML PO SYRP: 5.0000 mL | ORAL_SOLUTION | Freq: Four times a day (QID) | ORAL | Status: DC | PRN

## 2012-03-09 MED ORDER — AZITHROMYCIN 250 MG PO TABS: ORAL_TABLET | ORAL | Status: AC

## 2012-03-09 NOTE — Patient Instructions (Signed)
Get plenty of rest, Drink lots of  clear liquids, and use Tylenol or ibuprofen for fever and discomfort.    cough medicine as directed

## 2012-03-13 ENCOUNTER — Encounter: Payer: Self-pay | Admitting: Internal Medicine

## 2012-03-13 NOTE — Progress Notes (Signed)
  Subjective:    Patient ID: Teresa Martinez, female    DOB: [birth year], 71 y.o.   MRN: [id number]  HPI  71 year old patient who is seen today complaining of cough sinus congestion general malaise. Denies any fever chest pain shortness of breath or productive cough    Review of Systems  Constitutional: Positive for fatigue. Negative for fever and chills.  HENT: Positive for congestion, rhinorrhea and postnasal drip.   Respiratory: Positive for cough.        Objective:   Physical Exam  Constitutional: She is oriented to person, place, and time. She appears well-developed and well-nourished.  HENT:  Head: Normocephalic.  Right Ear: External ear normal.  Left Ear: External ear normal.  Mouth/Throat: Oropharynx is clear and moist.  Eyes: Conjunctivae and EOM are normal. Pupils are equal, round, and reactive to light.  Neck: Normal range of motion. Neck supple. No thyromegaly present.  Cardiovascular: Normal rate, regular rhythm, normal heart sounds and intact distal pulses.   Pulmonary/Chest: Effort normal and breath sounds normal.  Abdominal: Soft. Bowel sounds are normal. She exhibits no mass. There is no tenderness.  Musculoskeletal: Normal range of motion.  Lymphadenopathy:    She has no cervical adenopathy.  Neurological: She is alert and oriented to person, place, and time.  Skin: Skin is warm and dry. No rash noted.  Psychiatric: She has a normal mood and affect. Her behavior is normal.          Assessment & Plan:    Viral URI with cough. We'll treat symptomatically  Hypertension stable

## 2012-03-16 ENCOUNTER — Other Ambulatory Visit: Payer: Self-pay | Admitting: Internal Medicine

## 2012-03-16 MED ORDER — HYDROCODONE-HOMATROPINE 5-1.5 MG/5ML PO SYRP: 5.0000 mL | ORAL_SOLUTION | Freq: Four times a day (QID) | ORAL | Status: AC | PRN

## 2012-03-16 NOTE — Telephone Encounter (Signed)
ok 

## 2012-03-16 NOTE — Telephone Encounter (Signed)
Called into [facility]

## 2012-03-16 NOTE — Telephone Encounter (Signed)
Patient called back to check on rx.

## 2012-03-16 NOTE — Telephone Encounter (Signed)
Last seen 03/09/12 URI  Last written 03/09/12 0Rf Please advise

## 2012-03-16 NOTE — Telephone Encounter (Signed)
Patient called stating that she need a refill on her hydromet cough syrup. Please assist.

## 2012-04-09 ENCOUNTER — Other Ambulatory Visit: Payer: Self-pay | Admitting: Obstetrics

## 2012-04-09 DIAGNOSIS — Z1231 Encounter for screening mammogram for malignant neoplasm of breast: Secondary | ICD-10-CM

## 2012-04-22 ENCOUNTER — Telehealth: Payer: Self-pay | Admitting: Internal Medicine

## 2012-04-22 MED ORDER — FLUCONAZOLE 150 MG PO TABS: 150.0000 mg | ORAL_TABLET | Freq: Once | ORAL | Status: AC

## 2012-04-22 NOTE — Telephone Encounter (Signed)
Ok  150 mg  #1 

## 2012-04-22 NOTE — Telephone Encounter (Signed)
Pt called and has now developed a yeast inf from taking the abx that Dr Amador Cunas. Pt is req to get a script for Diflucan to [facility] on [address]

## 2012-04-22 NOTE — Telephone Encounter (Signed)
done

## 2012-04-22 NOTE — Telephone Encounter (Signed)
Please advise 

## 2012-05-18 ENCOUNTER — Ambulatory Visit
Admission: RE | Admit: 2012-05-18 | Discharge: 2012-05-18 | Disposition: A | Discharge status: Home / Self Care | Payer: Medicare Other | Source: Ambulatory Visit | Attending: Obstetrics | Admitting: Obstetrics

## 2012-05-18 DIAGNOSIS — Z1231 Encounter for screening mammogram for malignant neoplasm of breast: Secondary | ICD-10-CM

## 2012-07-24 ENCOUNTER — Encounter: Payer: Self-pay | Admitting: Internal Medicine

## 2012-07-24 ENCOUNTER — Ambulatory Visit (INDEPENDENT_AMBULATORY_CARE_PROVIDER_SITE_OTHER): Payer: Medicare Other | Admitting: Internal Medicine

## 2012-07-24 VITALS — BP 120/90 | HR 58 | Temp 97.6°F | Resp 16 | Ht 65.5 in | Wt 138.0 lb

## 2012-07-24 DIAGNOSIS — IMO0002 Reserved for concepts with insufficient information to code with codable children: Secondary | ICD-10-CM

## 2012-07-24 DIAGNOSIS — E785 Hyperlipidemia, unspecified: Secondary | ICD-10-CM

## 2012-07-24 DIAGNOSIS — I10 Essential (primary) hypertension: Secondary | ICD-10-CM

## 2012-07-24 DIAGNOSIS — Z0000 Encounter for general adult medical examination without abnormal findings: Secondary | ICD-10-CM

## 2012-07-24 DIAGNOSIS — Z23 Encounter for immunization: Secondary | ICD-10-CM

## 2012-07-24 LAB — CBC WITH DIFFERENTIAL/PLATELET
Basophils Absolute: 0 10*3/uL (ref 0.0–0.1)
Basophils Relative: 0.5 % (ref 0.0–3.0)
Eosinophils Absolute: 0.1 10*3/uL (ref 0.0–0.7)
Eosinophils Relative: 2.5 % (ref 0.0–5.0)
HCT: 43.3 % (ref 36.0–46.0)
Hemoglobin: 13.9 g/dL (ref 12.0–15.0)
Lymphocytes Relative: 41.7 % (ref 12.0–46.0)
Lymphs Abs: 2.2 10*3/uL (ref 0.7–4.0)
MCHC: 32 g/dL (ref 30.0–36.0)
MCV: 82.1 fl (ref 78.0–100.0)
Monocytes Absolute: 0.6 10*3/uL (ref 0.1–1.0)
Monocytes Relative: 10.9 % (ref 3.0–12.0)
Neutro Abs: 2.3 10*3/uL (ref 1.4–7.7)
Neutrophils Relative %: 44.4 % (ref 43.0–77.0)
Platelets: 224 10*3/uL (ref 150.0–400.0)
RBC: 5.28 Mil/uL — ABNORMAL HIGH (ref 3.87–5.11)
RDW: 15.2 % — ABNORMAL HIGH (ref 11.5–14.6)
WBC: 5.2 10*3/uL (ref 4.5–10.5)

## 2012-07-24 LAB — TSH: TSH: 0.99 u[IU]/mL (ref 0.35–5.50)

## 2012-07-24 MED ORDER — SIMVASTATIN 40 MG PO TABS: 40.0000 mg | ORAL_TABLET | Freq: Every evening | ORAL | Status: DC

## 2012-07-24 MED ORDER — FESOTERODINE FUMARATE ER 4 MG PO TB24: 4.0000 mg | ORAL_TABLET | Freq: Every day | ORAL | Status: DC

## 2012-07-24 MED ORDER — LISINOPRIL-HYDROCHLOROTHIAZIDE 20-12.5 MG PO TABS: 1.0000 | ORAL_TABLET | Freq: Every day | ORAL | Status: DC

## 2012-07-24 NOTE — Patient Instructions (Addendum)
Limit your sodium (Salt) intake  Please check your blood pressure on a regular basis.  If it is consistently greater than 150/90, please make an office appointment.    It is important that you exercise regularly, at least 20 minutes 3 to 4 times per week.  If you develop chest pain or shortness of breath seek  medical attention.  Return in one year for follow-up Cholesterol Control Diet Cholesterol levels in your body are determined significantly by your diet. Cholesterol levels may also be related to heart disease. The following material helps to explain this relationship and discusses what you can do to help keep your heart healthy. Not all cholesterol is bad. Low-density lipoprotein (LDL) cholesterol is the "bad" cholesterol. It may cause fatty deposits to build up inside your arteries. High-density lipoprotein (HDL) cholesterol is "good." It helps to remove the "bad" LDL cholesterol from your blood. Cholesterol is a very important risk factor for heart disease. Other risk factors are high blood pressure, smoking, stress, heredity, and weight. The heart muscle gets its supply of blood through the coronary arteries. If your LDL cholesterol is high and your HDL cholesterol is low, you are at risk for having fatty deposits build up in your coronary arteries. This leaves less room through which blood can flow. Without sufficient blood and oxygen, the heart muscle cannot function properly and you may feel chest pains (angina pectoris). When a coronary artery closes up entirely, a part of the heart muscle may die, causing a heart attack (myocardial infarction). CHECKING CHOLESTEROL When your caregiver sends your blood to a lab to be analyzed for cholesterol, a complete lipid (fat) profile may be done. With this test, the total amount of cholesterol and levels of LDL and HDL are determined. Triglycerides are a type of fat that circulates in the blood and can also be used to determine heart disease risk. The  list below describes what the numbers should be: Test: Total Cholesterol.  Less than 200 mg/dl.  Test: LDL "bad cholesterol."  Less than 100 mg/dl.   Less than 70 mg/dl if you are at very high risk of a heart attack or sudden cardiac death.  Test: HDL "good cholesterol."  Greater than 50 mg/dl for women.   Greater than 40 mg/dl for men.  Test: Triglycerides.  Less than 150 mg/dl.  CONTROLLING CHOLESTEROL WITH DIET Although exercise and lifestyle factors are important, your diet is key. That is because certain foods are known to raise cholesterol and others to lower it. The goal is to balance foods for their effect on cholesterol and more importantly, to replace saturated and trans fat with other types of fat, such as monounsaturated fat, polyunsaturated fat, and omega-3 fatty acids. On average, a person should consume no more than 15 to 17 g of saturated fat daily. Saturated and trans fats are considered "bad" fats, and they will raise LDL cholesterol. Saturated fats are primarily found in animal products such as meats, butter, and cream. However, that does not mean you need to sacrifice all your favorite foods. Today, there are good tasting, low-fat, low-cholesterol substitutes for most of the things you like to eat. Choose low-fat or nonfat alternatives. Choose round or loin cuts of red meat, since these types of cuts are lowest in fat and cholesterol. Chicken (without the skin), fish, veal, and ground [place] breast are excellent choices. Eliminate fatty meats, such as hot dogs and salami. Even shellfish have little or no saturated fat. Have a 3 oz (85  g) portion when you eat lean meat, poultry, or fish. Trans fats are also called "partially hydrogenated oils." They are oils that have been scientifically manipulated so that they are solid at room temperature resulting in a longer shelf life and improved taste and texture of foods in which they are added. Trans fats are found in stick margarine,  some tub margarines, cookies, crackers, and baked goods.   When baking and cooking, oils are an excellent substitute for butter. The monounsaturated oils are especially beneficial since it is believed they lower LDL and raise HDL. The oils you should avoid entirely are saturated tropical oils, such as coconut and palm.   Remember to eat liberally from food groups that are naturally free of saturated and trans fat, including fish, fruit, vegetables, beans, grains (barley, rice, couscous, bulgur wheat), and pasta (without cream sauces).   IDENTIFYING FOODS THAT LOWER CHOLESTEROL   Soluble fiber may lower your cholesterol. This type of fiber is found in fruits such as apples, vegetables such as broccoli, potatoes, and carrots, legumes such as beans, peas, and lentils, and grains such as barley. Foods fortified with plant sterols (phytosterol) may also lower cholesterol. You should eat at least 2 g per day of these foods for a cholesterol lowering effect.   Read package labels to identify low-saturated fats, trans fats free, and low-fat foods at the supermarket. Select cheeses that have only 2 to 3 g saturated fat per ounce. Use a heart-healthy tub margarine that is free of trans fats or partially hydrogenated oil. When buying baked goods (cookies, crackers), avoid partially hydrogenated oils. Breads and muffins should be made from whole grains (whole-wheat or whole oat flour, instead of "flour" or "enriched flour"). Buy non-creamy canned soups with reduced salt and no added fats.   FOOD PREPARATION TECHNIQUES   Never deep-fry. If you must fry, either stir-fry, which uses very little fat, or use non-stick cooking sprays. When possible, broil, bake, or roast meats, and steam vegetables. Instead of dressing vegetables with butter or margarine, use lemon and herbs, applesauce and cinnamon (for squash and sweet potatoes), nonfat yogurt, salsa, and low-fat dressings for salads.   LOW-SATURATED FAT / LOW-FAT FOOD  SUBSTITUTES Meats / Saturated Fat (g)  Avoid: Steak, marbled (3 oz/85 g) / 11 g   Choose: Steak, lean (3 oz/85 g) / 4 g   Avoid: Hamburger (3 oz/85 g) / 7 g   Choose: Hamburger, lean (3 oz/85 g) / 5 g   Avoid: Ham (3 oz/85 g) / 6 g   Choose: Ham, lean cut (3 oz/85 g) / 2.4 g   Avoid: Chicken, with skin, dark meat (3 oz/85 g) / 4 g   Choose: Chicken, skin removed, dark meat (3 oz/85 g) / 2 g   Avoid: Chicken, with skin, light meat (3 oz/85 g) / 2.5 g   Choose: Chicken, skin removed, light meat (3 oz/85 g) / 1 g  Dairy / Saturated Fat (g)  Avoid: Whole milk (1 cup) / 5 g   Choose: Low-fat milk, 2% (1 cup) / 3 g   Choose: Low-fat milk, 1% (1 cup) / 1.5 g   Choose: Skim milk (1 cup) / 0.3 g   Avoid: Hard cheese (1 oz/28 g) / 6 g   Choose: Skim milk cheese (1 oz/28 g) / 2 to 3 g   Avoid: Cottage cheese, 4% fat (1 cup) / 6.5 g   Choose: Low-fat cottage cheese, 1% fat (1 cup) / 1.5 g  Avoid: Ice cream (1 cup) / 9 g   Choose: Sherbet (1 cup) / 2.5 g   Choose: Nonfat frozen yogurt (1 cup) / 0.3 g   Choose: Frozen fruit bar / trace   Avoid: Whipped cream (1 tbs) / 3.5 g   Choose: Nondairy whipped topping (1 tbs) / 1 g  Condiments / Saturated Fat (g)  Avoid: Mayonnaise (1 tbs) / 2 g   Choose: Low-fat mayonnaise (1 tbs) / 1 g   Avoid: Butter (1 tbs) / 7 g   Choose: Extra light margarine (1 tbs) / 1 g   Avoid: Coconut oil (1 tbs) / 11.8 g   Choose: Olive oil (1 tbs) / 1.8 g   Choose: Corn oil (1 tbs) / 1.7 g   Choose: Safflower oil (1 tbs) / 1.2 g   Choose: Sunflower oil (1 tbs) / 1.4 g   Choose: Soybean oil (1 tbs) / 2.4 g   Choose: Canola oil (1 tbs) / 1 g  Document Released: 11/04/2005 Document Revised: 10/24/2011 Document Reviewed: 04/25/2011 [facility] Patient Information 2012 [place], [place].

## 2012-07-24 NOTE — Progress Notes (Signed)
Patient ID: Teresa Martinez, female   DOB: 1941/11/01, 71 y.o.   MRN: [id number]  Subjective:    Patient ID: Teresa Martinez, female    DOB: 11/02/1941, 71 y.o.   MRN: [id number]  HPI History of Present Illness:   71  year-old patient seen today for a comprehensive evaluation. She is followed annually by gynecology. She has a history of hypertension overactive bladder and mild dyslipidemia. Her last colonoscopy was in 2005 ; she has had a recent mammogram. She continues to be followed by gynecology. She is status post remote hysterectomy. Her chronic neck pain for several years duration is improved.   Last visit she was placed on simvastatin due  To dyslipidemia she has tolerated this medication well Here for Medicare AWV:   1. Risk factors based on Past M, S, F history: cardiovascular risk factors include hypertension, and dyslipidemia.  2. Physical Activities: remains active without limitations; jogs almost daily and even participates in races-jogs approximately 3 miles 6 days per week and participates in 5K races 3. Depression/mood: no history of depression or mood disorder  4. Hearing: no deficits  5. ADL's: completely active and independent in all aspects of daily living  6. Fall Risk: low  7. Home Safety: no problems identifying  8. Height, weight, &visual acuity:height and weight stable. No difficulty with her visual acuity wears glasses  9. Counseling: gynecologic follow-up encouraged  10. Labs ordered based on risk factors: laboratory profile, including lipid panel will be reviewed  11. Referral Coordination- GYN referral  12. Care Plan- heart healthy diet more regular exercise regimen encouraged  13. Cognitive Assessment- alert and oriented with normal affect. No mood disorder, handles all her daily affairs, and no difficulty with executive function or memory. No functional deficits   Current Medications (verified):  1) Lisinopril-Hydrochlorothiazide 20-12.5 Mg Tabs  (Lisinopril-Hydrochlorothiazide) .... One Daily  2) Toviaz 4 Mg Xr24h-Tab (Fesoterodine Fumarate) .... One Daily, As Needed   Allergies (verified):  1) ! Latex Exam Gloves (Disposable Gloves)   Past History:  Past Medical History:  Reviewed history from 01/25/2010 and no changes required.  Hyperlipidemia   menopausal syndrome Floyde Parkins)  hypertension  overactive bladder  Past Surgical History:  Reviewed history from 05/16/2009 and no changes required.  Cholecystectomy 1950  Hemorrhoidectomy -band ligation 2010  status post parotidectomy 30 years ago  Colonoscopy 2005  Hysterectomy 1985   Family History:  Reviewed history from 11/28/2008 and no changes required.  father died age 40. History congestive heart failure  mother died age 18. History diabetes, hypertension, cerebrovascular disease  Two sisters, one died from nonalcoholic cirrhosis  two brothers, one with a hypercholesterolemia, and hypertension; s/p CVA    Review of Systems  Constitutional: Negative for fever, appetite change, fatigue and unexpected weight change.  HENT: Negative for hearing loss, ear pain, nosebleeds, congestion, sore throat, mouth sores, trouble swallowing, neck stiffness, dental problem, voice change, sinus pressure and tinnitus.   Eyes: Negative for photophobia, pain, redness and visual disturbance.  Respiratory: Negative for cough, chest tightness and shortness of breath.   Cardiovascular: Negative for chest pain, palpitations and leg swelling.  Gastrointestinal: Negative for nausea, vomiting, abdominal pain, diarrhea, constipation, blood in stool, abdominal distention and rectal pain.  Genitourinary: Negative for dysuria, urgency, frequency, hematuria, flank pain, vaginal bleeding, vaginal discharge, difficulty urinating, genital sores, vaginal pain, menstrual problem and pelvic pain.  Musculoskeletal: Negative for back pain and arthralgias.       Chronic posterior neck pain that  interferes with sleep  Skin: Negative for rash.  Neurological: Negative for dizziness, syncope, speech difficulty, weakness, light-headedness, numbness and headaches.  Hematological: Negative for adenopathy. Does not bruise/bleed easily.  Psychiatric/Behavioral: Negative for suicidal ideas, behavioral problems, self-injury, dysphoric mood and agitation. The patient is not nervous/anxious.        Objective:   Physical Exam  Constitutional: She is oriented to person, place, and time. She appears well-developed and well-nourished.  HENT:  Head: Normocephalic and atraumatic.  Right Ear: External ear normal.  Left Ear: External ear normal.  Mouth/Throat: Oropharynx is clear and moist.  Eyes: Conjunctivae and EOM are normal.  Neck: Normal range of motion. Neck supple. No JVD present. No thyromegaly present.  Cardiovascular: Normal rate, regular rhythm, normal heart sounds and intact distal pulses.   No murmur heard.      Diminished left dorsalis pedis pulse  Pulmonary/Chest: Effort normal and breath sounds normal. She has no wheezes. She has no rales.  Abdominal: Soft. Bowel sounds are normal. She exhibits no distension and no mass. There is no tenderness. There is no rebound and no guarding.  Musculoskeletal: Normal range of motion. She exhibits no edema and no tenderness.  Neurological: She is alert and oriented to person, place, and time. She has normal reflexes. No cranial nerve deficit. She exhibits normal muscle tone. Coordination normal.  Skin: Skin is warm and dry. No rash noted.  Psychiatric: She has a normal mood and affect. Her behavior is normal.          Assessment & Plan:   Preventive health care Dyslipidemia Hypertension well controlled  Laboratory studies to be updated Recheck 6 months Exercise low salt diet encouraged

## 2012-07-27 LAB — COMPREHENSIVE METABOLIC PANEL
ALT: 28 U/L (ref 0–35)
AST: 30 U/L (ref 0–37)
Albumin: 4.5 g/dL (ref 3.5–5.2)
Alkaline Phosphatase: 62 U/L (ref 39–117)
BUN: 14 mg/dL (ref 6–23)
CO2: 24 mEq/L (ref 19–32)
Calcium: 9.7 mg/dL (ref 8.4–10.5)
Chloride: 105 mEq/L (ref 96–112)
Creatinine, Ser: 0.9 mg/dL (ref 0.4–1.2)
GFR: 77.89 mL/min (ref >60.00)
Glucose, Bld: 83 mg/dL (ref 70–99)
Potassium: 4.7 mEq/L (ref 3.5–5.1)
Sodium: 142 mEq/L (ref 135–145)
Total Bilirubin: 0.4 mg/dL (ref 0.3–1.2)
Total Protein: 7.3 g/dL (ref 6.0–8.3)

## 2012-07-27 LAB — LIPID PANEL
Cholesterol: 169 mg/dL (ref 0–200)
HDL: 61.9 mg/dL (ref >39.00)
LDL Cholesterol: 92 mg/dL (ref 0–99)
Total CHOL/HDL Ratio: 3
Triglycerides: 76 mg/dL (ref 0.0–149.0)
VLDL: 15.2 mg/dL (ref 0.0–40.0)

## 2012-07-27 NOTE — Progress Notes (Signed)
Quick Note:  Spoke with pt- informed of lab resutls ______ 

## 2013-03-10 ENCOUNTER — Other Ambulatory Visit: Payer: Self-pay | Admitting: Internal Medicine

## 2013-04-23 ENCOUNTER — Other Ambulatory Visit: Payer: Self-pay

## 2013-04-23 DIAGNOSIS — Z1231 Encounter for screening mammogram for malignant neoplasm of breast: Secondary | ICD-10-CM

## 2013-05-20 ENCOUNTER — Ambulatory Visit
Admission: RE | Admit: 2013-05-20 | Discharge: 2013-05-20 | Disposition: A | Discharge status: Home / Self Care | Payer: Medicare Other | Source: Ambulatory Visit

## 2013-05-20 DIAGNOSIS — Z1231 Encounter for screening mammogram for malignant neoplasm of breast: Secondary | ICD-10-CM

## 2013-07-30 ENCOUNTER — Ambulatory Visit (INDEPENDENT_AMBULATORY_CARE_PROVIDER_SITE_OTHER): Payer: Medicare PPO | Admitting: Internal Medicine

## 2013-07-30 ENCOUNTER — Encounter: Payer: Self-pay | Admitting: Internal Medicine

## 2013-07-30 VITALS — BP 120/70 | HR 68 | Temp 97.9°F | Resp 18 | Ht 65.5 in | Wt 139.0 lb

## 2013-07-30 DIAGNOSIS — Z0000 Encounter for general adult medical examination without abnormal findings: Secondary | ICD-10-CM

## 2013-07-30 DIAGNOSIS — Z23 Encounter for immunization: Secondary | ICD-10-CM

## 2013-07-30 DIAGNOSIS — E785 Hyperlipidemia, unspecified: Secondary | ICD-10-CM

## 2013-07-30 DIAGNOSIS — IMO0002 Reserved for concepts with insufficient information to code with codable children: Secondary | ICD-10-CM

## 2013-07-30 LAB — COMPREHENSIVE METABOLIC PANEL
ALT: 14 U/L (ref 0–35)
AST: 20 U/L (ref 0–37)
Albumin: 4.3 g/dL (ref 3.5–5.2)
Alkaline Phosphatase: 57 U/L (ref 39–117)
BUN: 17 mg/dL (ref 6–23)
CO2: 31 mEq/L (ref 19–32)
Calcium: 9.4 mg/dL (ref 8.4–10.5)
Chloride: 103 mEq/L (ref 96–112)
Creatinine, Ser: 0.9 mg/dL (ref 0.4–1.2)
GFR: 84.04 mL/min (ref >60.00)
Glucose, Bld: 82 mg/dL (ref 70–99)
Potassium: 4 mEq/L (ref 3.5–5.1)
Sodium: 140 mEq/L (ref 135–145)
Total Bilirubin: 0.5 mg/dL (ref 0.3–1.2)
Total Protein: 7.1 g/dL (ref 6.0–8.3)

## 2013-07-30 LAB — CBC WITH DIFFERENTIAL/PLATELET
Basophils Absolute: 0 10*3/uL (ref 0.0–0.1)
Basophils Relative: 0.5 % (ref 0.0–3.0)
Eosinophils Absolute: 0.1 10*3/uL (ref 0.0–0.7)
Eosinophils Relative: 1.7 % (ref 0.0–5.0)
HCT: 43.7 % (ref 36.0–46.0)
Hemoglobin: 14.3 g/dL (ref 12.0–15.0)
Lymphocytes Relative: 34 % (ref 12.0–46.0)
Lymphs Abs: 1.8 10*3/uL (ref 0.7–4.0)
MCHC: 32.8 g/dL (ref 30.0–36.0)
MCV: 81.2 fl (ref 78.0–100.0)
Monocytes Absolute: 0.9 10*3/uL (ref 0.1–1.0)
Monocytes Relative: 16.4 % — ABNORMAL HIGH (ref 3.0–12.0)
Neutro Abs: 2.6 10*3/uL (ref 1.4–7.7)
Neutrophils Relative %: 47.4 % (ref 43.0–77.0)
Platelets: 228 10*3/uL (ref 150.0–400.0)
RBC: 5.38 Mil/uL — ABNORMAL HIGH (ref 3.87–5.11)
RDW: 14.8 % — ABNORMAL HIGH (ref 11.5–14.6)
WBC: 5.4 10*3/uL (ref 4.5–10.5)

## 2013-07-30 LAB — LIPID PANEL
Cholesterol: 276 mg/dL — ABNORMAL HIGH (ref 0–200)
HDL: 55.8 mg/dL (ref >39.00)
Total CHOL/HDL Ratio: 5
Triglycerides: 90 mg/dL (ref 0.0–149.0)
VLDL: 18 mg/dL (ref 0.0–40.0)

## 2013-07-30 LAB — TSH: TSH: 0.75 u[IU]/mL (ref 0.35–5.50)

## 2013-07-30 LAB — LDL CHOLESTEROL, DIRECT: Direct LDL: 205.1 mg/dL

## 2013-07-30 MED ORDER — SIMVASTATIN 40 MG PO TABS: 40.0000 mg | ORAL_TABLET | Freq: Every evening | ORAL | Status: DC

## 2013-07-30 NOTE — Patient Instructions (Signed)
Limit your sodium (Salt) intake    It is important that you exercise regularly, at least 20 minutes 3 to 4 times per week.  If you develop chest pain or shortness of breath seek  medical attention.  Please check your blood pressure on a regular basis.  If it is consistently greater than 150/90, please make an office appointment.  Return in one year for follow-up  Take a calcium supplement, plus 800-1200 units of vitamin D 

## 2013-07-30 NOTE — Progress Notes (Signed)
Patient ID: Teresa Martinez, female   DOB: Apr 17, 1941, 72 y.o.   MRN: [id number] Patient ID: Teresa Martinez, female   DOB: February 06, 1941, 72 y.o.   MRN: [id number]  Subjective:    Patient ID: Teresa Martinez, female    DOB: 17-Aug-1941, 72 y.o.   MRN: [id number]  HPI History of Present Illness:   72 year-old patient seen today for a comprehensive evaluation. She is followed annually by gynecology. She has a history of hypertension overactive bladder and mild dyslipidemia. Her last colonoscopy was in 2005 ; she has had a recent mammogram. She continues to be followed by gynecology. She is status post remote hysterectomy. Her chronic neck pain for several years duration is improved.   Last visit she was placed on simvastatin due  To dyslipidemia;  She initially tolerated this medication well but now she states that she develops a leg pain when she uses chronically. She states that she is using simvastatin only 15 days of the last 30  Here for Medicare AWV:   1. Risk factors based on Past M, S, F history: cardiovascular risk factors include hypertension, and dyslipidemia.  2. Physical Activities: remains active without limitations; jogs almost daily and even participates in races-jogs approximately 3 miles 6 days per week and participates in 5K races 3. Depression/mood: no history of depression or mood disorder  4. Hearing: no deficits  5. ADL's: completely active and independent in all aspects of daily living  6. Fall Risk: low  7. Home Safety: no problems identifying  8. Height, weight, &visual acuity:height and weight stable. No difficulty with her visual acuity wears glasses  9. Counseling: gynecologic follow-up encouraged  10. Labs ordered based on risk factors: laboratory profile, including lipid panel will be reviewed  11. Referral Coordination- GYN referral  12. Care Plan- heart healthy diet more regular exercise regimen encouraged  13. Cognitive Assessment- alert and  oriented with normal affect. No mood disorder, handles all her daily affairs, and no difficulty with executive function or memory. No functional deficits   Current Medications (verified):  1) Lisinopril-Hydrochlorothiazide 20-12.5 Mg Tabs (Lisinopril-Hydrochlorothiazide) .... One Daily  2) Toviaz 4 Mg Xr24h-Tab (Fesoterodine Fumarate) .... One Daily, As Needed   Allergies (verified):  1) ! Latex Exam Gloves (Disposable Gloves)   Past History:  Past Medical History:  Reviewed history from 01/25/2010 and no changes required.  Hyperlipidemia   menopausal syndrome Floyde Parkins)  hypertension  overactive bladder  Past Surgical History:  Reviewed history from 05/16/2009 and no changes required.  Cholecystectomy 1950  Hemorrhoidectomy -band ligation 2010  status post parotidectomy 30 years ago  Colonoscopy 2005  Hysterectomy 1985   Family History:  Reviewed history from 11/28/2008 and no changes required.  father died age 52. History congestive heart failure  mother died age 31. History diabetes, hypertension, cerebrovascular disease  Two sisters, one died from nonalcoholic cirrhosis  two brothers, one with a hypercholesterolemia, and hypertension; s/p CVA    Review of Systems  Constitutional: Negative for fever, appetite change, fatigue and unexpected weight change.  HENT: Negative for hearing loss, ear pain, nosebleeds, congestion, sore throat, mouth sores, trouble swallowing, neck stiffness, dental problem, voice change, sinus pressure and tinnitus.   Eyes: Negative for photophobia, pain, redness and visual disturbance.  Respiratory: Negative for cough, chest tightness and shortness of breath.   Cardiovascular: Negative for chest pain, palpitations and leg swelling.  Gastrointestinal: Negative for nausea, vomiting, abdominal pain, diarrhea, constipation, blood in stool, abdominal distention and rectal  pain.  Genitourinary: Negative for dysuria, urgency, frequency,  hematuria, flank pain, vaginal bleeding, vaginal discharge, difficulty urinating, genital sores, vaginal pain, menstrual problem and pelvic pain.  Musculoskeletal: Negative for back pain and arthralgias.       Chronic posterior neck pain that interferes with sleep  Skin: Negative for rash.  Neurological: Negative for dizziness, syncope, speech difficulty, weakness, light-headedness, numbness and headaches.  Hematological: Negative for adenopathy. Does not bruise/bleed easily.  Psychiatric/Behavioral: Negative for suicidal ideas, behavioral problems, self-injury, dysphoric mood and agitation. The patient is not nervous/anxious.        Objective:   Physical Exam  Constitutional: She is oriented to person, place, and time. She appears well-developed and well-nourished.  HENT:  Head: Normocephalic and atraumatic.  Right Ear: External ear normal.  Left Ear: External ear normal.  Mouth/Throat: Oropharynx is clear and moist.  Eyes: Conjunctivae and EOM are normal.  Neck: Normal range of motion. Neck supple. No JVD present. No thyromegaly present.  Cardiovascular: Normal rate, regular rhythm, normal heart sounds and intact distal pulses.   No murmur heard. Diminished left dorsalis pedis pulse  Pulmonary/Chest: Effort normal and breath sounds normal. She has no wheezes. She has no rales.  Abdominal: Soft. Bowel sounds are normal. She exhibits no distension and no mass. There is no tenderness. There is no rebound and no guarding.  Musculoskeletal: Normal range of motion. She exhibits no edema and no tenderness.  Neurological: She is alert and oriented to person, place, and time. She has normal reflexes. No cranial nerve deficit. She exhibits normal muscle tone. Coordination normal.  Skin: Skin is warm and dry. No rash noted.  Psychiatric: She has a normal mood and affect. Her behavior is normal.          Assessment & Plan:   Preventive health care Dyslipidemia Hypertension well  controlled  Laboratory studies to be updated Recheck 1 year Exercise low salt diet encouraged

## 2013-10-13 ENCOUNTER — Other Ambulatory Visit: Payer: Self-pay | Admitting: Internal Medicine

## 2013-11-18 DIAGNOSIS — I639 Cerebral infarction, unspecified: Secondary | ICD-10-CM

## 2013-11-18 HISTORY — DX: Cerebral infarction, unspecified: I63.9

## 2014-03-10 ENCOUNTER — Other Ambulatory Visit: Payer: Self-pay | Admitting: Internal Medicine

## 2014-04-12 ENCOUNTER — Encounter: Payer: Self-pay | Admitting: Internal Medicine

## 2014-04-12 ENCOUNTER — Ambulatory Visit (INDEPENDENT_AMBULATORY_CARE_PROVIDER_SITE_OTHER): Payer: Medicare PPO | Admitting: Internal Medicine

## 2014-04-12 VITALS — BP 150/74 | HR 98 | Temp 98.7°F | Ht 65.5 in | Wt 142.0 lb

## 2014-04-12 DIAGNOSIS — IMO0002 Reserved for concepts with insufficient information to code with codable children: Secondary | ICD-10-CM

## 2014-04-12 DIAGNOSIS — G4700 Insomnia, unspecified: Secondary | ICD-10-CM

## 2014-04-12 DIAGNOSIS — J069 Acute upper respiratory infection, unspecified: Secondary | ICD-10-CM

## 2014-04-12 MED ORDER — HYDROCODONE-HOMATROPINE 5-1.5 MG/5ML PO SYRP: 5.0000 mL | ORAL_SOLUTION | Freq: Four times a day (QID) | ORAL | Status: DC | PRN

## 2014-04-12 MED ORDER — FLUCONAZOLE 150 MG PO TABS: ORAL_TABLET | ORAL | Status: DC

## 2014-04-12 MED ORDER — AZITHROMYCIN 250 MG PO TABS: ORAL_TABLET | ORAL | Status: DC

## 2014-04-12 NOTE — Progress Notes (Signed)
Pre visit review using our clinic review tool, if applicable. No additional management support is needed unless otherwise documented below in the visit note. 

## 2014-04-12 NOTE — Progress Notes (Signed)
Subjective:    Patient ID: Teresa Martinez, female    DOB: 03/05/1941, 73 y.o.   MRN: [id number]  HPI 73 year old patient who has a history of hypertension and dyslipidemia.  For the past 3 weeks.  She has had increasing chest congestion and cough.  5 days ago.  Her cough worsened and has become much more productive.  She has had no definite fever.  Cough has been incessant and has interfered with sleep.  No wheezing or chest pain. For the past few days.  She is also complaining of a thick, white vaginal discharge.  Past Medical History  Diagnosis Date  . Hyperlipemia   . Statin intolerance   . Menopausal syndrome     Teresa Pen(Sherry VermillionDickstein)  . Hypertension   . Overactive bladder     History   Social History  . Marital Status: Married    Spouse Name: N/A    Number of Children: N/A  . Years of Education: N/A   Occupational History  . Not on file.   Social History Main Topics  . Smoking status: Never Smoker   . Smokeless tobacco: Never Used  . Alcohol Use: No  . Drug Use: No  . Sexual Activity: Not on file   Other Topics Concern  . Not on file   Social History Narrative   Regular exercise, 4 children, 6 grandchildren, one stepchild and two additional stepgrandchildren    Past Surgical History  Procedure Laterality Date  . Cholecystectomy      1950  . Band hemorrhoidectomy      2010  . Parotidectomy      30 years ago  . Abdominal hysterectomy      1985    Family History  Problem Relation Age of Onset  . Heart failure Father   . Diabetes Mother   . Hypertension Mother   . Stroke Mother   . Cirrhosis Sister   . Hyperlipidemia Brother   . Hypertension Brother     Allergies  Allergen Reactions  . Latex     Current Outpatient Prescriptions on File Prior to Visit  Medication Sig Dispense Refill  . ESTRACE VAGINAL 0.1 MG/GM vaginal cream Place 1 g vaginally 2 (two) times a week.       Marland Kitchen. ibuprofen (ADVIL,MOTRIN) 100 MG tablet Take 100 mg by mouth  every 6 (six) hours as needed.        Marland Kitchen. lisinopril-hydrochlorothiazide (PRINZIDE,ZESTORETIC) 20-12.5 MG per tablet TAKE 1 TABLET BY MOUTH DAILY  90 tablet  1  . TOVIAZ 4 MG TB24 tablet TAKE 1 TABLET BY MOUTH DAILY  30 tablet  5  . [DISCONTINUED] fesoterodine (TOVIAZ) 4 MG TB24 Take 1 tablet (4 mg total) by mouth daily.  30 tablet  3   No current facility-administered medications on file prior to visit.    BP 150/74  Pulse 98  Temp(Src) 98.7 F (37.1 C) (Oral)  Ht 5' 5.5" (1.664 m)  Wt 142 lb (64.411 kg)  BMI 23.26 kg/m2  SpO2 99%       Review of Systems  Constitutional: Positive for fatigue.  HENT: Negative for congestion, dental problem, hearing loss, rhinorrhea, sinus pressure, sore throat and tinnitus.   Eyes: Negative for pain, discharge and visual disturbance.  Respiratory: Positive for cough. Negative for shortness of breath.   Cardiovascular: Negative for chest pain, palpitations and leg swelling.  Gastrointestinal: Negative for nausea, vomiting, abdominal pain, diarrhea, constipation, blood in stool and abdominal distention.  Genitourinary: Positive for vaginal  discharge. Negative for dysuria, urgency, frequency, hematuria, flank pain, vaginal bleeding, difficulty urinating, vaginal pain and pelvic pain.  Musculoskeletal: Negative for arthralgias, gait problem and joint swelling.  Skin: Negative for rash.  Neurological: Negative for dizziness, syncope, speech difficulty, weakness, numbness and headaches.  Hematological: Negative for adenopathy.  Psychiatric/Behavioral: Negative for behavioral problems, dysphoric mood and agitation. The patient is not nervous/anxious.        Objective:   Physical Exam  Constitutional: She is oriented to person, place, and time. She appears well-developed and well-nourished.  HENT:  Head: Normocephalic.  Right Ear: External ear normal.  Left Ear: External ear normal.  Mouth/Throat: Oropharynx is clear and moist.  Eyes: Conjunctivae  and EOM are normal. Pupils are equal, round, and reactive to light.  Neck: Normal range of motion. Neck supple. No thyromegaly present.  Cardiovascular: Normal rate, regular rhythm, normal heart sounds and intact distal pulses.   Pulmonary/Chest: Effort normal.  Few scattered rhonchi  Abdominal: Soft. Bowel sounds are normal. She exhibits no mass. There is no tenderness.  Musculoskeletal: Normal range of motion.  Lymphadenopathy:    She has no cervical adenopathy.  Neurological: She is alert and oriented to person, place, and time.  Skin: Skin is warm and dry. No rash noted.  Psychiatric: She has a normal mood and affect. Her behavior is normal.          Assessment & Plan:   URI/bronchitis.  Due to the chronicity and now worsening, symptoms, we'll treat with azithromycin.  We'll continue expectorants Vaginal discharge.  Patient has had Candida deal vaginitis in the, past.  We'll treat with Diflucan Hypertension stable

## 2014-04-12 NOTE — Patient Instructions (Signed)
Take over-the-counter expectorants and cough medications such as  Mucinex DM.  Call if there is no improvement in 5 to 7 days or if  you develop worsening cough, fever, or new symptoms, such as shortness of breath or chest pain.  Limit your sodium (Salt) intake  Call or return to clinic prn if these symptoms worsen or fail to improve as anticipated.

## 2014-04-13 ENCOUNTER — Telehealth: Payer: Self-pay | Admitting: Internal Medicine

## 2014-04-13 NOTE — Telephone Encounter (Signed)
Relevant patient education mailed to patient.  

## 2014-04-14 ENCOUNTER — Other Ambulatory Visit: Payer: Self-pay

## 2014-04-14 DIAGNOSIS — Z1231 Encounter for screening mammogram for malignant neoplasm of breast: Secondary | ICD-10-CM

## 2014-05-15 ENCOUNTER — Other Ambulatory Visit: Payer: Self-pay | Admitting: Internal Medicine

## 2014-06-01 ENCOUNTER — Ambulatory Visit
Admission: RE | Admit: 2014-06-01 | Discharge: 2014-06-01 | Disposition: A | Discharge status: Home / Self Care | Payer: Medicare PPO | Source: Ambulatory Visit

## 2014-06-01 DIAGNOSIS — Z1231 Encounter for screening mammogram for malignant neoplasm of breast: Secondary | ICD-10-CM

## 2014-08-12 ENCOUNTER — Other Ambulatory Visit: Payer: Self-pay | Admitting: Obstetrics

## 2014-08-12 DIAGNOSIS — Z780 Asymptomatic menopausal state: Secondary | ICD-10-CM

## 2014-08-12 DIAGNOSIS — E2839 Other primary ovarian failure: Secondary | ICD-10-CM

## 2014-08-12 DIAGNOSIS — R5381 Other malaise: Secondary | ICD-10-CM

## 2014-08-19 ENCOUNTER — Other Ambulatory Visit: Payer: Self-pay | Admitting: Internal Medicine

## 2014-08-26 ENCOUNTER — Ambulatory Visit (INDEPENDENT_AMBULATORY_CARE_PROVIDER_SITE_OTHER): Payer: Medicare PPO | Admitting: Internal Medicine

## 2014-08-26 ENCOUNTER — Encounter: Payer: Self-pay | Admitting: Internal Medicine

## 2014-08-26 VITALS — BP 134/76 | HR 76 | Temp 98.2°F | Ht 65.25 in | Wt 140.0 lb

## 2014-08-26 DIAGNOSIS — I10 Essential (primary) hypertension: Secondary | ICD-10-CM

## 2014-08-26 DIAGNOSIS — Z0000 Encounter for general adult medical examination without abnormal findings: Secondary | ICD-10-CM

## 2014-08-26 DIAGNOSIS — Z23 Encounter for immunization: Secondary | ICD-10-CM

## 2014-08-26 DIAGNOSIS — E7800 Pure hypercholesterolemia, unspecified: Secondary | ICD-10-CM

## 2014-08-26 LAB — CBC WITH DIFFERENTIAL/PLATELET
Basophils Absolute: 0 10*3/uL (ref 0.0–0.1)
Basophils Relative: 0.6 % (ref 0.0–3.0)
Eosinophils Absolute: 0.2 10*3/uL (ref 0.0–0.7)
Eosinophils Relative: 3.3 % (ref 0.0–5.0)
HCT: 43.7 % (ref 36.0–46.0)
Hemoglobin: 13.8 g/dL (ref 12.0–15.0)
Lymphocytes Relative: 41.5 % (ref 12.0–46.0)
Lymphs Abs: 2.3 10*3/uL (ref 0.7–4.0)
MCHC: 31.7 g/dL (ref 30.0–36.0)
MCV: 82.7 fl (ref 78.0–100.0)
Monocytes Absolute: 0.5 10*3/uL (ref 0.1–1.0)
Monocytes Relative: 9.7 % (ref 3.0–12.0)
Neutro Abs: 2.5 10*3/uL (ref 1.4–7.7)
Neutrophils Relative %: 44.9 % (ref 43.0–77.0)
Platelets: 241 10*3/uL (ref 150.0–400.0)
RBC: 5.28 Mil/uL — ABNORMAL HIGH (ref 3.87–5.11)
RDW: 14.1 % (ref 11.5–15.5)
WBC: 5.5 10*3/uL (ref 4.0–10.5)

## 2014-08-26 LAB — COMPREHENSIVE METABOLIC PANEL
ALT: 14 U/L (ref 0–35)
AST: 27 U/L (ref 0–37)
Albumin: 3.9 g/dL (ref 3.5–5.2)
Alkaline Phosphatase: 54 U/L (ref 39–117)
BUN: 14 mg/dL (ref 6–23)
CO2: 25 mEq/L (ref 19–32)
Calcium: 9.8 mg/dL (ref 8.4–10.5)
Chloride: 102 mEq/L (ref 96–112)
Creatinine, Ser: 0.9 mg/dL (ref 0.4–1.2)
GFR: 79.46 mL/min (ref >60.00)
Glucose, Bld: 79 mg/dL (ref 70–99)
Potassium: 3.6 mEq/L (ref 3.5–5.1)
Sodium: 138 mEq/L (ref 135–145)
Total Bilirubin: 0.5 mg/dL (ref 0.2–1.2)
Total Protein: 7.4 g/dL (ref 6.0–8.3)

## 2014-08-26 LAB — LIPID PANEL
Cholesterol: 263 mg/dL — ABNORMAL HIGH (ref 0–200)
HDL: 51 mg/dL (ref >39.00)
LDL Cholesterol: 192 mg/dL — ABNORMAL HIGH (ref 0–99)
NonHDL: 212
Total CHOL/HDL Ratio: 5
Triglycerides: 100 mg/dL (ref 0.0–149.0)
VLDL: 20 mg/dL (ref 0.0–40.0)

## 2014-08-26 LAB — TSH: TSH: 0.69 u[IU]/mL (ref 0.35–4.50)

## 2014-08-26 MED ORDER — FESOTERODINE FUMARATE ER 4 MG PO TB24: 4.0000 mg | ORAL_TABLET | Freq: Every day | ORAL | Status: DC

## 2014-08-26 MED ORDER — PRAVASTATIN SODIUM 20 MG PO TABS: 20.0000 mg | ORAL_TABLET | Freq: Every day | ORAL | Status: DC

## 2014-08-26 MED ORDER — LISINOPRIL-HYDROCHLOROTHIAZIDE 20-12.5 MG PO TABS: 1.0000 | ORAL_TABLET | Freq: Every day | ORAL | Status: DC

## 2014-08-26 NOTE — Patient Instructions (Signed)
Limit your sodium (Salt) intake  Please check your blood pressure on a regular basis.  If it is consistently greater than 150/90, please make an office appointment.    It is important that you exercise regularly, at least 20 minutes 3 to 4 times per week.  If you develop chest pain or shortness of breath seek  medical attention.  Return in one year for follow-up Health Maintenance Adopting a healthy lifestyle and getting preventive care can go a long way to promote health and wellness. Talk with your health care provider about what schedule of regular examinations is right for you. This is a good chance for you to check in with your provider about disease prevention and staying healthy. In between checkups, there are plenty of things you can do on your own. Experts have done a lot of research about which lifestyle changes and preventive measures are most likely to keep you healthy. Ask your health care provider for more information. WEIGHT AND DIET  Eat a healthy diet  Be sure to include plenty of vegetables, fruits, low-fat dairy products, and lean protein.  Do not eat a lot of foods high in solid fats, added sugars, or salt.  Get regular exercise. This is one of the most important things you can do for your health.  Most adults should exercise for at least 150 minutes each week. The exercise should increase your heart rate and make you sweat (moderate-intensity exercise).  Most adults should also do strengthening exercises at least twice a week. This is in addition to the moderate-intensity exercise.  Maintain a healthy weight  Body mass index (BMI) is a measurement that can be used to identify possible weight problems. It estimates body fat based on height and weight. Your health care provider can help determine your BMI and help you achieve or maintain a healthy weight.  For females 20 years of age and older:   A BMI below 18.5 is considered underweight.  A BMI of 18.5 to 24.9 is  normal.  A BMI of 25 to 29.9 is considered overweight.  A BMI of 30 and above is considered obese.  Watch levels of cholesterol and blood lipids  You should start having your blood tested for lipids and cholesterol at 75 years of age, then have this test every 5 years.  You may need to have your cholesterol levels checked more often if:  Your lipid or cholesterol levels are high.  You are older than 75 years of age.  You are at high risk for heart disease.  CANCER SCREENING   Lung Cancer  Lung cancer screening is recommended for adults 55-80 years old who are at high risk for lung cancer because of a history of smoking.  A yearly low-dose CT scan of the lungs is recommended for people who:  Currently smoke.  Have quit within the past 15 years.  Have at least a 30-pack-year history of smoking. A pack year is smoking an average of one pack of cigarettes a day for 1 year.  Yearly screening should continue until it has been 15 years since you quit.  Yearly screening should stop if you develop a health problem that would prevent you from having lung cancer treatment.  Breast Cancer  Practice breast self-awareness. This means understanding how your breasts normally appear and feel.  It also means doing regular breast self-exams. Let your health care provider know about any changes, no matter how small.  If you are in your 20s   or 30s, you should have a clinical breast exam (CBE) by a health care provider every 1-3 years as part of a regular health exam.  If you are 40 or older, have a CBE every year. Also consider having a breast X-ray (mammogram) every year.  If you have a family history of breast cancer, talk to your health care provider about genetic screening.  If you are at high risk for breast cancer, talk to your health care provider about having an MRI and a mammogram every year.  Breast cancer gene (BRCA) assessment is recommended for women who have family members  with BRCA-related cancers. BRCA-related cancers include:  Breast.  Ovarian.  Tubal.  Peritoneal cancers.  Results of the assessment will determine the need for genetic counseling and BRCA1 and BRCA2 testing. Cervical Cancer Routine pelvic examinations to screen for cervical cancer are no longer recommended for nonpregnant women who are considered low risk for cancer of the pelvic organs (ovaries, uterus, and vagina) and who do not have symptoms. A pelvic examination may be necessary if you have symptoms including those associated with pelvic infections. Ask your health care provider if a screening pelvic exam is right for you.   The Pap test is the screening test for cervical cancer for women who are considered at risk.  If you had a hysterectomy for a problem that was not cancer or a condition that could lead to cancer, then you no longer need Pap tests.  If you are older than 65 years, and you have had normal Pap tests for the past 10 years, you no longer need to have Pap tests.  If you have had past treatment for cervical cancer or a condition that could lead to cancer, you need Pap tests and screening for cancer for at least 20 years after your treatment.  If you no longer get a Pap test, assess your risk factors if they change (such as having a new sexual partner). This can affect whether you should start being screened again.  Some women have medical problems that increase their chance of getting cervical cancer. If this is the case for you, your health care provider may recommend more frequent screening and Pap tests.  The human papillomavirus (HPV) test is another test that may be used for cervical cancer screening. The HPV test looks for the virus that can cause cell changes in the cervix. The cells collected during the Pap test can be tested for HPV.  The HPV test can be used to screen women 30 years of age and older. Getting tested for HPV can extend the interval between normal  Pap tests from three to five years.  An HPV test also should be used to screen women of any age who have unclear Pap test results.  After 75 years of age, women should have HPV testing as often as Pap tests.  Colorectal Cancer  This type of cancer can be detected and often prevented.  Routine colorectal cancer screening usually begins at 75 years of age and continues through 75 years of age.  Your health care provider may recommend screening at an earlier age if you have risk factors for colon cancer.  Your health care provider may also recommend using home test kits to check for hidden blood in the stool.  A small camera at the end of a tube can be used to examine your colon directly (sigmoidoscopy or colonoscopy). This is done to check for the earliest forms of colorectal cancer.    Routine screening usually begins at age 50.  Direct examination of the colon should be repeated every 5-10 years through 75 years of age. However, you may need to be screened more often if early forms of precancerous polyps or small growths are found. Skin Cancer  Check your skin from head to toe regularly.  Tell your health care provider about any new moles or changes in moles, especially if there is a change in a mole's shape or color.  Also tell your health care provider if you have a mole that is larger than the size of a pencil eraser.  Always use sunscreen. Apply sunscreen liberally and repeatedly throughout the day.  Protect yourself by wearing long sleeves, pants, a wide-brimmed hat, and sunglasses whenever you are outside. HEART DISEASE, DIABETES, AND HIGH BLOOD PRESSURE   Have your blood pressure checked at least every 1-2 years. High blood pressure causes heart disease and increases the risk of stroke.  If you are between 55 years and 79 years old, ask your health care provider if you should take aspirin to prevent strokes.  Have regular diabetes screenings. This involves taking a blood  sample to check your fasting blood sugar level.  If you are at a normal weight and have a low risk for diabetes, have this test once every three years after 75 years of age.  If you are overweight and have a high risk for diabetes, consider being tested at a younger age or more often. PREVENTING INFECTION  Hepatitis B  If you have a higher risk for hepatitis B, you should be screened for this virus. You are considered at high risk for hepatitis B if:  You were born in a country where hepatitis B is common. Ask your health care provider which countries are considered high risk.  Your parents were born in a high-risk country, and you have not been immunized against hepatitis B (hepatitis B vaccine).  You have HIV or AIDS.  You use needles to inject street drugs.  You live with someone who has hepatitis B.  You have had sex with someone who has hepatitis B.  You get hemodialysis treatment.  You take certain medicines for conditions, including cancer, organ transplantation, and autoimmune conditions. Hepatitis C  Blood testing is recommended for:  Everyone born from 1945 through 1965.  Anyone with known risk factors for hepatitis C. Sexually transmitted infections (STIs)  You should be screened for sexually transmitted infections (STIs) including gonorrhea and chlamydia if:  You are sexually active and are younger than 75 years of age.  You are older than 75 years of age and your health care provider tells you that you are at risk for this type of infection.  Your sexual activity has changed since you were last screened and you are at an increased risk for chlamydia or gonorrhea. Ask your health care provider if you are at risk.  If you do not have HIV, but are at risk, it may be recommended that you take a prescription medicine daily to prevent HIV infection. This is called pre-exposure prophylaxis (PrEP). You are considered at risk if:  You are sexually active and do not  regularly use condoms or know the HIV status of your partner(s).  You take drugs by injection.  You are sexually active with a partner who has HIV. Talk with your health care provider about whether you are at high risk of being infected with HIV. If you choose to begin PrEP, you should first   be tested for HIV. You should then be tested every 3 months for as long as you are taking PrEP.  PREGNANCY   If you are premenopausal and you may become pregnant, ask your health care provider about preconception counseling.  If you may become pregnant, take 400 to 800 micrograms (mcg) of folic acid every day.  If you want to prevent pregnancy, talk to your health care provider about birth control (contraception). OSTEOPOROSIS AND MENOPAUSE   Osteoporosis is a disease in which the bones lose minerals and strength with aging. This can result in serious bone fractures. Your risk for osteoporosis can be identified using a bone density scan.  If you are 65 years of age or older, or if you are at risk for osteoporosis and fractures, ask your health care provider if you should be screened.  Ask your health care provider whether you should take a calcium or vitamin D supplement to lower your risk for osteoporosis.  Menopause may have certain physical symptoms and risks.  Hormone replacement therapy may reduce some of these symptoms and risks. Talk to your health care provider about whether hormone replacement therapy is right for you.  HOME CARE INSTRUCTIONS   Schedule regular health, dental, and eye exams.  Stay current with your immunizations.   Do not use any tobacco products including cigarettes, chewing tobacco, or electronic cigarettes.  If you are pregnant, do not drink alcohol.  If you are breastfeeding, limit how much and how often you drink alcohol.  Limit alcohol intake to no more than 1 drink per day for nonpregnant women. One drink equals 12 ounces of beer, 5 ounces of wine, or 1  ounces of hard liquor.  Do not use street drugs.  Do not share needles.  Ask your health care provider for help if you need support or information about quitting drugs.  Tell your health care provider if you often feel depressed.  Tell your health care provider if you have ever been abused or do not feel safe at home. Document Released: 05/20/2011 Document Revised: 03/21/2014 Document Reviewed: 10/06/2013 [employer] Patient Information 2015 [employer]. This information is not intended to replace advice given to you by your health care provider. Make sure you discuss any questions you have with your health care provider.  

## 2014-08-26 NOTE — Progress Notes (Signed)
Pre visit review using our clinic review tool, if applicable. No additional management support is needed unless otherwise documented below in the visit note. 

## 2014-08-26 NOTE — Progress Notes (Signed)
Patient ID: Teresa Martinez, female   DOB: 03/04/1941, 73 y.o.   MRN: [id number] Patient ID: Teresa Martinez, female   DOB: 01/01/1941, 73 y.o.   MRN: [id number]  Subjective:    Patient ID: Teresa Martinez, female    DOB: 01/21/1941, 73 y.o.   MRN: [id number]  HPI  History of Present Illness:   73 year-old patient seen today for a comprehensive evaluation.  She is followed annually by gynecology. She has a history of hypertension overactive bladder and mild dyslipidemia. Her last colonoscopy was in 2014.  She was noted to have clonic polyps at that time and five-year interval recommended ; she has had a recent mammogram. She continues to be followed by gynecology. She is status post remote hysterectomy. Her chronic neck pain for several years duration is improved.  She continues to have muscle pain with statin use.  This has occurred with Baycol, atorvastatin, and presently is on simvastatin, that she takes infrequently due to discomfort. Here for Medicare AWV:   1. Risk factors based on Past M, S, F history: cardiovascular risk factors include hypertension, and dyslipidemia.  2. Physical Activities: remains active without limitations; jogs almost daily and even participates in races-jogs approximately 3 miles 6 days per week and participates in 5K races 3. Depression/mood: no history of depression or mood disorder  4. Hearing: no deficits  5. ADL's: completely active and independent in all aspects of daily living  6. Fall Risk: low  7. Home Safety: no problems identifying  8. Height, weight, &visual acuity:height and weight stable. No difficulty with her visual acuity wears glasses  9. Counseling: gynecologic follow-up encouraged  10. Labs ordered based on risk factors: laboratory profile, including lipid panel will be reviewed  11. Referral Coordination- GYN referral  12. Care Plan- heart healthy diet more regular exercise regimen encouraged  13. Cognitive Assessment-  alert and oriented with normal affect. No mood disorder, handles all her daily affairs, and no difficulty with executive function or memory. No functional deficits 14.  Preventive services.  The patient will continue to have preventive medicine examination from gynecology and primary care.  She does have annual mammograms.  She has had a recent colonoscopy within the past year 15.  Provider list- includes GI.  Gynecology radiology and primary care. Patient was provided with a written and personalized care plan   Current Medications (verified):  1) Lisinopril-Hydrochlorothiazide 20-12.5 Mg Tabs (Lisinopril-Hydrochlorothiazide) .... One Daily  2) Toviaz 4 Mg Xr24h-Tab (Fesoterodine Fumarate) .... One Daily, As Needed   Allergies (verified):  1) ! Latex Exam Gloves (Disposable Gloves)   Past History:  Past Medical History:   Hyperlipidemia   menopausal syndrome Floyde Parkins( Sherry Dickstein)  hypertension  overactive bladder  Past Surgical History:   Cholecystectomy 1950  Hemorrhoidectomy -band ligation 2010  status post parotidectomy 30 years ago  Colonoscopy 2004 2014  Hysterectomy 1985   Family History:   father died age 75. History congestive heart failure  mother died age 75. History diabetes, hypertension, cerebrovascular disease  Two sisters, one died from nonalcoholic cirrhosis  two brothers, one with a hypercholesterolemia, and hypertension; s/p CVA    Review of Systems  Constitutional: Negative for fever, appetite change, fatigue and unexpected weight change.  HENT: Negative for congestion, dental problem, ear pain, hearing loss, mouth sores, nosebleeds, sinus pressure, sore throat, tinnitus, trouble swallowing and voice change.   Eyes: Negative for photophobia, pain, redness and visual disturbance.  Respiratory: Negative for cough, chest tightness and shortness  of breath.   Cardiovascular: Negative for chest pain, palpitations and leg swelling.  Gastrointestinal: Negative for  nausea, vomiting, abdominal pain, diarrhea, constipation, blood in stool, abdominal distention and rectal pain.  Genitourinary: Negative for dysuria, urgency, frequency, hematuria, flank pain, vaginal bleeding, vaginal discharge, difficulty urinating, genital sores, vaginal pain, menstrual problem and pelvic pain.  Musculoskeletal: Negative for arthralgias, back pain and neck stiffness.       Chronic posterior neck pain that interferes with sleep  Skin: Negative for rash.  Neurological: Negative for dizziness, syncope, speech difficulty, weakness, light-headedness, numbness and headaches.  Hematological: Negative for adenopathy. Does not bruise/bleed easily.  Psychiatric/Behavioral: Negative for suicidal ideas, behavioral problems, self-injury, dysphoric mood and agitation. The patient is not nervous/anxious.        Objective:   Physical Exam  Constitutional: She is oriented to person, place, and time. She appears well-developed and well-nourished.  HENT:  Head: Normocephalic and atraumatic.  Right Ear: External ear normal.  Left Ear: External ear normal.  Mouth/Throat: Oropharynx is clear and moist.  Eyes: Conjunctivae and EOM are normal.  Neck: Normal range of motion. Neck supple. No JVD present. No thyromegaly present.  Cardiovascular: Normal rate, regular rhythm, normal heart sounds and intact distal pulses.   No murmur heard. Diminished left dorsalis pedis pulse  Pulmonary/Chest: Effort normal and breath sounds normal. She has no wheezes. She has no rales.  Abdominal: Soft. Bowel sounds are normal. She exhibits no distension and no mass. There is no tenderness. There is no rebound and no guarding.  Musculoskeletal: Normal range of motion. She exhibits no edema and no tenderness.  Neurological: She is alert and oriented to person, place, and time. She has normal reflexes. No cranial nerve deficit. She exhibits normal muscle tone. Coordination normal.  Skin: Skin is warm and dry. No  rash noted.  Psychiatric: She has a normal mood and affect. Her behavior is normal.          Assessment & Plan:   Preventive health care Dyslipidemia.  Will give a trial of pravastatin  Hypertension well controlled  Laboratory studies to be updated Recheck 1 year Exercise low salt diet encouraged

## 2014-09-07 ENCOUNTER — Telehealth: Payer: Self-pay | Admitting: Internal Medicine

## 2014-09-07 NOTE — Telephone Encounter (Addendum)
Pt would like blood work results. Also has ?about shingle vaccine

## 2014-09-09 NOTE — Telephone Encounter (Signed)
Please call/notify patient that lab/test/procedure is normal except for elevated cholesterol. Shingles vaccine recommended

## 2014-09-09 NOTE — Telephone Encounter (Signed)
Spoke to pt, told her labs normal except Cholesterol elevated at 263, LDL 192. Pt verbalized understanding. Also recommended shingles vaccine. Pt said she checked with her insurance and will be covered 100% if she goes to pharmacy and will just have to pay co-pay. Told pt that is fine can have at the pharmacy. Pt verbalized understanding.

## 2014-09-09 NOTE — Telephone Encounter (Signed)
Please advise lab results.

## 2014-09-28 ENCOUNTER — Other Ambulatory Visit: Payer: Medicare PPO

## 2014-09-29 ENCOUNTER — Ambulatory Visit
Admission: RE | Admit: 2014-09-29 | Discharge: 2014-09-29 | Disposition: A | Discharge status: Home / Self Care | Payer: Medicare PPO | Source: Ambulatory Visit | Attending: Obstetrics | Admitting: Obstetrics

## 2014-09-29 DIAGNOSIS — E2839 Other primary ovarian failure: Secondary | ICD-10-CM

## 2014-10-14 ENCOUNTER — Encounter (HOSPITAL_COMMUNITY): Payer: Self-pay | Admitting: *Deleted

## 2014-10-14 ENCOUNTER — Emergency Department (HOSPITAL_COMMUNITY): Payer: Medicare PPO

## 2014-10-14 ENCOUNTER — Inpatient Hospital Stay (HOSPITAL_COMMUNITY)
Admission: EM | Admit: 2014-10-14 | Discharge: 2014-10-19 | DRG: 062 | Disposition: A | Discharge status: Rehabilitation Facility | Payer: Medicare PPO | Attending: Neurology | Admitting: Neurology

## 2014-10-14 ENCOUNTER — Inpatient Hospital Stay (HOSPITAL_COMMUNITY): Payer: Medicare PPO

## 2014-10-14 DIAGNOSIS — M439 Deforming dorsopathy, unspecified: Secondary | ICD-10-CM

## 2014-10-14 DIAGNOSIS — Z823 Family history of stroke: Secondary | ICD-10-CM

## 2014-10-14 DIAGNOSIS — M25511 Pain in right shoulder: Secondary | ICD-10-CM | POA: Diagnosis present

## 2014-10-14 DIAGNOSIS — G8191 Hemiplegia, unspecified affecting right dominant side: Secondary | ICD-10-CM | POA: Diagnosis present

## 2014-10-14 DIAGNOSIS — E785 Hyperlipidemia, unspecified: Secondary | ICD-10-CM | POA: Diagnosis present

## 2014-10-14 DIAGNOSIS — I63312 Cerebral infarction due to thrombosis of left middle cerebral artery: Secondary | ICD-10-CM | POA: Diagnosis present

## 2014-10-14 DIAGNOSIS — Z91040 Latex allergy status: Secondary | ICD-10-CM

## 2014-10-14 DIAGNOSIS — M4802 Spinal stenosis, cervical region: Secondary | ICD-10-CM | POA: Diagnosis present

## 2014-10-14 DIAGNOSIS — Z8673 Personal history of transient ischemic attack (TIA), and cerebral infarction without residual deficits: Secondary | ICD-10-CM | POA: Diagnosis present

## 2014-10-14 DIAGNOSIS — Z888 Allergy status to other drugs, medicaments and biological substances status: Secondary | ICD-10-CM | POA: Diagnosis not present

## 2014-10-14 DIAGNOSIS — N3281 Overactive bladder: Secondary | ICD-10-CM | POA: Diagnosis present

## 2014-10-14 DIAGNOSIS — Z79899 Other long term (current) drug therapy: Secondary | ICD-10-CM

## 2014-10-14 DIAGNOSIS — R4781 Slurred speech: Secondary | ICD-10-CM | POA: Diagnosis present

## 2014-10-14 DIAGNOSIS — Z8249 Family history of ischemic heart disease and other diseases of the circulatory system: Secondary | ICD-10-CM | POA: Diagnosis not present

## 2014-10-14 DIAGNOSIS — E876 Hypokalemia: Secondary | ICD-10-CM | POA: Diagnosis present

## 2014-10-14 DIAGNOSIS — E7800 Pure hypercholesterolemia, unspecified: Secondary | ICD-10-CM | POA: Diagnosis present

## 2014-10-14 DIAGNOSIS — G459 Transient cerebral ischemic attack, unspecified: Secondary | ICD-10-CM | POA: Diagnosis present

## 2014-10-14 DIAGNOSIS — K118 Other diseases of salivary glands: Secondary | ICD-10-CM

## 2014-10-14 DIAGNOSIS — I6350 Cerebral infarction due to unspecified occlusion or stenosis of unspecified cerebral artery: Secondary | ICD-10-CM | POA: Insufficient documentation

## 2014-10-14 DIAGNOSIS — I10 Essential (primary) hypertension: Secondary | ICD-10-CM

## 2014-10-14 DIAGNOSIS — F419 Anxiety disorder, unspecified: Secondary | ICD-10-CM

## 2014-10-14 DIAGNOSIS — G454 Transient global amnesia: Secondary | ICD-10-CM

## 2014-10-14 DIAGNOSIS — G8929 Other chronic pain: Secondary | ICD-10-CM | POA: Diagnosis present

## 2014-10-14 DIAGNOSIS — I639 Cerebral infarction, unspecified: Secondary | ICD-10-CM | POA: Insufficient documentation

## 2014-10-14 HISTORY — DX: Other chronic pain: G89.29

## 2014-10-14 HISTORY — DX: Pain in right shoulder: M25.511

## 2014-10-14 HISTORY — DX: Spondylosis without myelopathy or radiculopathy, cervical region: M47.812

## 2014-10-14 LAB — CBC
HCT: 38.1 % (ref 36.0–46.0)
Hemoglobin: 12.4 g/dL (ref 12.0–15.0)
MCH: 26.1 pg (ref 26.0–34.0)
MCHC: 32.5 g/dL (ref 30.0–36.0)
MCV: 80.2 fL (ref 78.0–100.0)
Platelets: 193 10*3/uL (ref 150–400)
RBC: 4.75 MIL/uL (ref 3.87–5.11)
RDW: 14 % (ref 11.5–15.5)
WBC: 7.3 10*3/uL (ref 4.0–10.5)

## 2014-10-14 LAB — I-STAT CHEM 8, ED
BUN: 23 mg/dL (ref 6–23)
Calcium, Ion: 1.13 mmol/L (ref 1.13–1.30)
Chloride: 107 mEq/L (ref 96–112)
Creatinine, Ser: 1.1 mg/dL (ref 0.50–1.10)
Glucose, Bld: 188 mg/dL — ABNORMAL HIGH (ref 70–99)
HCT: 40 % (ref 36.0–46.0)
Hemoglobin: 13.6 g/dL (ref 12.0–15.0)
Potassium: 3.4 mEq/L — ABNORMAL LOW (ref 3.7–5.3)
Sodium: 142 mEq/L (ref 137–147)
TCO2: 22 mmol/L (ref 0–100)

## 2014-10-14 LAB — COMPREHENSIVE METABOLIC PANEL
ALT: 12 U/L (ref 0–35)
AST: 26 U/L (ref 0–37)
Albumin: 3.9 g/dL (ref 3.5–5.2)
Alkaline Phosphatase: 51 U/L (ref 39–117)
Anion gap: 18 — ABNORMAL HIGH (ref 5–15)
BUN: 23 mg/dL (ref 6–23)
CO2: 22 mEq/L (ref 19–32)
Calcium: 9 mg/dL (ref 8.4–10.5)
Chloride: 104 mEq/L (ref 96–112)
Creatinine, Ser: 1.04 mg/dL (ref 0.50–1.10)
GFR calc Af Amer: 59 mL/min — ABNORMAL LOW (ref >90)
GFR calc non Af Amer: 51 mL/min — ABNORMAL LOW (ref >90)
Glucose, Bld: 184 mg/dL — ABNORMAL HIGH (ref 70–99)
Potassium: 3.6 mEq/L — ABNORMAL LOW (ref 3.7–5.3)
Sodium: 144 mEq/L (ref 137–147)
Total Bilirubin: <0.2 mg/dL — ABNORMAL LOW (ref 0.3–1.2)
Total Protein: 6.7 g/dL (ref 6.0–8.3)

## 2014-10-14 LAB — DIFFERENTIAL
Basophils Absolute: 0 10*3/uL (ref 0.0–0.1)
Basophils Relative: 0 % (ref 0–1)
Eosinophils Absolute: 0.2 10*3/uL (ref 0.0–0.7)
Eosinophils Relative: 2 % (ref 0–5)
Lymphocytes Relative: 49 % — ABNORMAL HIGH (ref 12–46)
Lymphs Abs: 3.6 10*3/uL (ref 0.7–4.0)
Monocytes Absolute: 0.5 10*3/uL (ref 0.1–1.0)
Monocytes Relative: 7 % (ref 3–12)
Neutro Abs: 3.1 10*3/uL (ref 1.7–7.7)
Neutrophils Relative %: 42 % — ABNORMAL LOW (ref 43–77)

## 2014-10-14 LAB — I-STAT TROPONIN, ED: Troponin i, poc: 0 ng/mL (ref 0.00–0.08)

## 2014-10-14 LAB — APTT: aPTT: 23 seconds — ABNORMAL LOW (ref 24–37)

## 2014-10-14 LAB — PROTIME-INR
INR: 1.01 (ref 0.00–1.49)
Prothrombin Time: 13.4 seconds (ref 11.6–15.2)

## 2014-10-14 LAB — CBG MONITORING, ED: Glucose-Capillary: 123 mg/dL — ABNORMAL HIGH (ref 70–99)

## 2014-10-14 MED ORDER — ALTEPLASE (STROKE) FULL DOSE INFUSION
0.9000 mg/kg | Freq: Once | INTRAVENOUS | Status: AC
  Administered 2014-10-14: 57 mg via INTRAVENOUS
  Filled 2014-10-14: qty 57

## 2014-10-14 MED ORDER — SODIUM CHLORIDE 0.9 % IV SOLN: 250.0000 mL | Freq: Once | INTRAVENOUS | Status: DC

## 2014-10-14 MED ORDER — ALTEPLASE (STROKE) FULL DOSE INFUSION
90.0000 mg | Freq: Once | INTRAVENOUS | Status: DC
Start: 2014-10-14 — End: 2014-10-14

## 2014-10-14 MED ORDER — SODIUM CHLORIDE 0.9 % IV SOLN
250.0000 mL | Freq: Once | INTRAVENOUS | Status: AC
  Administered 2014-10-15: 250 mL via INTRAVENOUS

## 2014-10-14 MED ORDER — IOHEXOL 350 MG/ML SOLN
80.0000 mL | Freq: Once | INTRAVENOUS | Status: AC | PRN
  Administered 2014-10-14: 80 mL via INTRAVENOUS

## 2014-10-14 NOTE — ED Notes (Signed)
Pt arrives via EMS from home. EMS reports that at 1930 pt called out to her husband "help me" and complained of rt sided facial tingling, rt arm paralysis and garbled speech. EMS was called and upon arrival symptoms had resolved. During transportation symptoms returned and a Code Stroke was called.

## 2014-10-14 NOTE — Consult Note (Addendum)
Referring Physician: EDP    Chief Complaint: Transient slurred speech and right-sided weakness.  HPI: Teresa Martinez is an 73 y.o. female with a history of hypertension and hyperlipidemia who acute onset of slurred speech and inability to move right extremities at 7:30 PM tonight. She has no previous history of stroke nor TIA. She has not been on antiplatelet therapy. The scan of her head showed no acute intracranial abnormality. Deficits improved when EMS arrived. A recurrence of slurred speech in route to the emergency room resolved prior to arrival and did not recur. NIH stroke score was 0 at that point. At 2255 she developed recurrence of slurred speech and right facial weakness. Decision was made at that point to treat with IV TPA, which was administered. Patient's deficits resolved prior to starting TPA treatment. However, because of the recurrent nature of her symptoms TPA was chosen instead of heparin. As well, if patient had become symptomatic again and heparin started, TPA would've been contraindicated.  LSN: [id number] on 10/14/2014 tPA Given: Yes mRankin:  Past Medical History  Diagnosis Date  . Hyperlipemia   . Statin intolerance   . Menopausal syndrome     [name](Sherry WashingtonDickstein)  . Hypertension   . Overactive bladder     Family History  Problem Relation Age of Onset  . Heart failure Father   . Diabetes Mother   . Hypertension Mother   . Stroke Mother   . Cirrhosis Sister   . Hyperlipidemia Brother   . Hypertension Brother      Medications: I have reviewed the patient's current medications.  ROS: History obtained from the patient  General ROS: negative for - chills, fatigue, fever, night sweats, weight gain or weight loss Psychological ROS: negative for - behavioral disorder, hallucinations, memory difficulties, mood swings or suicidal ideation Ophthalmic ROS: negative for - blurry vision, double vision, eye pain or loss of vision ENT ROS: negative for -  epistaxis, nasal discharge, oral lesions, sore throat, tinnitus or vertigo Allergy and Immunology ROS: negative for - hives or itchy/watery eyes Hematological and Lymphatic ROS: negative for - bleeding problems, bruising or swollen lymph nodes Endocrine ROS: negative for - galactorrhea, hair pattern changes, polydipsia/polyuria or temperature intolerance Respiratory ROS: negative for - cough, hemoptysis, shortness of breath or wheezing Cardiovascular ROS: negative for - chest pain, dyspnea on exertion, edema or irregular heartbeat Gastrointestinal ROS: negative for - abdominal pain, diarrhea, hematemesis, nausea/vomiting or stool incontinence Genito-Urinary ROS: negative for - dysuria, hematuria, incontinence or urinary frequency/urgency Musculoskeletal ROS: negative for - joint swelling or muscular weakness Neurological ROS: as noted in HPI Dermatological ROS: negative for rash and skin lesion changes  Physical Examination: Blood pressure 131/68, pulse 75, temperature 97.9 F (36.6 C), temperature source Oral, resp. rate 14, height 5\' 6"  (1.676 m), weight 63.05 kg (139 lb), SpO2 99 %.   HEENT-  Normocephalic, no lesions, without obvious abnormality.  Normal external eye and conjunctiva.  Normal TM's bilaterally.  Normal auditory canals and external ears. Normal external nose, mucus membranes and septum.  Normal pharynx. Neck supple with no masses, nodes, nodules or enlargement. Cardiovascular - regular rate and rhythm, S1, S2 normal, no murmur, click, rub or gallop Lungs - chest clear, no wheezing, rales, normal symmetric air entry Abdomen - soft, non-tender; bowel sounds normal; no masses,  no organomegaly Extremities - no joint deformities, effusion, or inflammation, no edema and no skin discoloration  Neurologic Examination: Mental Status: Alert, oriented, thought content appropriate.  Speech fluent without evidence of  aphasia. Able to follow commands without difficulty. Cranial  Nerves: II-Visual fields were normal. III/IV/VI-Pupils were equal and reacted. Extraocular movements were full and conjugate.    V/VII-no facial numbness and no facial weakness. VIII-normal. X-normal speech and symmetrical palatal movement. Motor: 5/5 bilaterally with normal tone and bulk Sensory: Normal throughout. Deep Tendon Reflexes: 1+ and symmetric. Plantars: Flexor bilaterally Cerebellar: Normal finger-to-nose testing. Carotid auscultation: Normal  Ct Head (brain) Wo Contrast  10/14/2014   CLINICAL DATA:  Code stroke. Right arm paralysis and right facial tingling.  EXAM: CT HEAD WITHOUT CONTRAST  TECHNIQUE: Contiguous axial images were obtained from the base of the skull through the vertex without contrast.  COMPARISON:  None  FINDINGS: No evidence for an acute hemorrhage. Low density within the insular cortex bilaterally could represent old infarcts. There is no evidence for a large new infarct. No evidence for hydrocephalus, midline shift or mass lesion. No acute bone abnormality.  IMPRESSION: Negative for acute hemorrhage. Probable old insults in the insular cortex.   Electronically Signed   By: Richarda OverlieAdam  Henn M.D.   On: 10/14/2014 21:06    Assessment: 73 y.o. female presenting with recurrent left MCA territory ischemic deficits, with a pattern indicative of stroke in evolution and likely intravascular thrombus.  Stroke Risk Factors - family history, hyperlipidemia and hypertension  Plan: 1. Post TPA management and neuro intensive care unit 2. CT angiogram of head and neck 3. MRI of the brain without contrast 4. PT consult, OT consult, Speech consult 5. Echocardiogram 6. Carotid dopplers 7. Prophylactic therapy-Antiplatelet med: Aspirin if CT scan of the head 24 hours post TPA administration shows no intracranial hemorrhage 8. HgbA1c, fasting lipid panel 9. Telemetry monitoring  Patient presented with acute stroke in evolution with significant potential for clinical worsening  acutely. Management involved complex decision-making as well as rapid intervention with thrombolytic therapy with IV TPA, and subsequent admission to the neuro intensive care unit. Initial evaluation also involved family counseling. Total critical care time was 90 minutes.   C.R. Roseanne RenoStewart, MD [facility] [phone]  10/14/2014, 9:16 PM

## 2014-10-14 NOTE — Progress Notes (Signed)
Code Stroke called on 73 y.o female. LSN at 1930 when watching television and eating she developed slurred speech and inability to move right arm and leg. Pt pertinent history includes hyperlipidemia with statin intolerance, and HTN well controlled with medication. Pt is very active and exercises often. Per EMS at 2015 upon their arrival her symptoms completely resolved however during transport at 2032 pt began having garbled speech and right sided droop. [facility]. CT scan completed stat, per neurologist negative for acute abnormalities. NIHSS 0. CBG 123. Pt for admission tonight for full stroke work up.

## 2014-10-14 NOTE — ED Notes (Signed)
Neurologist at bedside. 

## 2014-10-14 NOTE — ED Provider Notes (Signed)
22:50- I was asked by nursing to see the patient because her doctor was busy with another patient.  The patient.*States that her symptoms have returned.  She currently has right-sided numbness of face, arm and leg, and slurred speech; according to the daughter.  The patient is able to speak and confirms these statements.  Brief neurologic exam- alert, mild dysarthria.  No sensation change, right compared to left.  Full smile is noted.  Questionable depressed nasolabial fold, right.  Right hand grip somewhat less than the left.  She is able to elevate both feet off the stretcher.  Nursing has paged code stroke, again.   Teresa MelterElliott L Alexcis Bicking, MD 10/14/14 [phone]

## 2014-10-15 ENCOUNTER — Encounter (HOSPITAL_COMMUNITY): Payer: Self-pay | Admitting: Radiology

## 2014-10-15 ENCOUNTER — Inpatient Hospital Stay (HOSPITAL_COMMUNITY): Payer: Medicare PPO

## 2014-10-15 DIAGNOSIS — I6789 Other cerebrovascular disease: Secondary | ICD-10-CM

## 2014-10-15 DIAGNOSIS — I63032 Cerebral infarction due to thrombosis of left carotid artery: Secondary | ICD-10-CM

## 2014-10-15 DIAGNOSIS — G451 Carotid artery syndrome (hemispheric): Secondary | ICD-10-CM

## 2014-10-15 LAB — LIPID PANEL
Cholesterol: 215 mg/dL — ABNORMAL HIGH (ref 0–200)
HDL: 51 mg/dL (ref >39)
LDL Cholesterol: 132 mg/dL — ABNORMAL HIGH (ref 0–99)
Total CHOL/HDL Ratio: 4.2 RATIO
Triglycerides: 158 mg/dL — ABNORMAL HIGH (ref <150)
VLDL: 32 mg/dL (ref 0–40)

## 2014-10-15 LAB — URINALYSIS, ROUTINE W REFLEX MICROSCOPIC
Bilirubin Urine: NEGATIVE
Glucose, UA: NEGATIVE mg/dL
Hgb urine dipstick: NEGATIVE
Ketones, ur: NEGATIVE mg/dL
Leukocytes, UA: NEGATIVE
Nitrite: NEGATIVE
Protein, ur: NEGATIVE mg/dL
Specific Gravity, Urine: 1.02 (ref 1.005–1.030)
Urobilinogen, UA: 0.2 mg/dL (ref 0.0–1.0)
pH: 6.5 (ref 5.0–8.0)

## 2014-10-15 LAB — MRSA PCR SCREENING: MRSA by PCR: NEGATIVE

## 2014-10-15 LAB — HEMOGLOBIN A1C
Hgb A1c MFr Bld: 6 % — ABNORMAL HIGH (ref <5.7)
Mean Plasma Glucose: 126 mg/dL — ABNORMAL HIGH (ref <117)

## 2014-10-15 MED ORDER — ASPIRIN 325 MG PO TABS
325.0000 mg | ORAL_TABLET | Freq: Every day | ORAL | Status: DC
  Administered 2014-10-15 – 2014-10-19 (×5): 325 mg via ORAL
  Filled 2014-10-15 (×5): qty 1

## 2014-10-15 MED ORDER — LABETALOL HCL 5 MG/ML IV SOLN: 10.0000 mg | INTRAVENOUS | Status: DC | PRN

## 2014-10-15 MED ORDER — PANTOPRAZOLE SODIUM 40 MG IV SOLR
40.0000 mg | Freq: Every day | INTRAVENOUS | Status: DC
  Administered 2014-10-15 (×2): 40 mg via INTRAVENOUS
  Filled 2014-10-15 (×3): qty 40

## 2014-10-15 MED ORDER — IOHEXOL 300 MG/ML  SOLN
75.0000 mL | Freq: Once | INTRAMUSCULAR | Status: AC | PRN
  Administered 2014-10-15: 75 mL via INTRAVENOUS

## 2014-10-15 MED ORDER — ACETAMINOPHEN 650 MG RE SUPP: 650.0000 mg | RECTAL | Status: DC | PRN

## 2014-10-15 MED ORDER — ACETAMINOPHEN 325 MG PO TABS: 650.0000 mg | ORAL_TABLET | ORAL | Status: DC | PRN

## 2014-10-15 MED ORDER — SODIUM CHLORIDE 0.9 % IV SOLN
INTRAVENOUS | Status: DC
  Administered 2014-10-15: 75 mL/h via INTRAVENOUS
  Administered 2014-10-16: 05:00:00 via INTRAVENOUS

## 2014-10-15 MED ORDER — STROKE: EARLY STAGES OF RECOVERY BOOK
Freq: Once | Status: AC
  Administered 2014-10-15: 01:00:00
  Filled 2014-10-15: qty 1

## 2014-10-15 MED ORDER — LORAZEPAM 2 MG/ML IJ SOLN
1.0000 mg | Freq: Once | INTRAMUSCULAR | Status: AC
  Administered 2014-10-15: 1 mg via INTRAVENOUS
  Filled 2014-10-15: qty 1

## 2014-10-15 NOTE — Progress Notes (Signed)
Code Stroke Reactivated at 2253 for pt with new symptoms right facial droop, right arm and leg weakness, aphasia, dysarthria. LSN per nursing 2246. TPA ordered per Dr.Stewart . NIHSS scored 8 at 2300, at 2307 symptoms completely resolved. TPA started at 2314 despite resolution of symptoms per Dr.Stewart. See NIHSS assessments for specifics. Pt taken for CTA  Of head and neck, then admitted to 3 M Neuro ICU.

## 2014-10-15 NOTE — Progress Notes (Signed)
  Echocardiogram 2D Echocardiogram has been performed.  Arvil ChacoFoster, Aunesty Tyson 10/15/2014, 2:05 PM

## 2014-10-15 NOTE — Progress Notes (Signed)
Dr Lucia GaskinsAhern made aware of pt with continuing weakness over last hour with NIH increased from 1 to 9, with slurred speech, worsening RUE/RLE weakness. MRI results reviewed and additional MRI of neck ordered. Will continue to monitor pt.

## 2014-10-15 NOTE — Plan of Care (Signed)
Problem: tPA Day Progression Outcomes-Only if tPA administered Goal: Inclusion criteria for tPA STANDARD: < 3hours from symptoms onset: - Diagnosis of ischemic stroke causing measureable neurological deficit - Neurological signs shold not be minor & isolated - Symptoms of stroke should not be sugestive of SAH - No head trauma or prior stroke in previous 3 months - No gastrointestinal or urinary tract hemorrhage in previous 21 days - No arterial puncture at a noncompressible site in previous 7 days - BP not elevated [systolic <292 mmHg, diastolic < 909 mmHg] - Not taking oral anticoagulant or if being taken INR < or equal 1.7 - Platelet count > or equal 100,000 mm3 - No seizure w/postictal residual neurological impairments - Pt/family understand potential risks/benefits from treatment - Caution in pts with NIHSS > 22 - Seizure at stroke onset eligible for tPA if residual impairments due to stroke and not the seizures. - Neurological signs should not be clearing spontaneously - Exercise caution in treating pt with major deficits - Symptoms onset < 3hrs before beginning treatment - No MI in previous 3 months - No major surgery in previous 14 days - No history previous intracranial hemorrhage - No evidence active bleeding/acute trauma (fracture) on examinations - If receiving heparin in previous 48 hrs, aPTT must be normal range - Abnormal Blood Glucose < 50 or > 400 mg/dL - CT does not show multilobar infarction [hypodensity >1/3 cerebral hemisphere] EXTENDED: 3-4.5 hrous from symptom onset - Age > 80 years - History of prior stroke and diabetes - Any anticoagulant use prior to admission (even if INR < 1.7) - NIHSS > 25"  Outcome: Completed/Met Date Met:  10/15/14 Goal: Pre tPA BP < 185/110 - see orders for mgmt Outcome: Completed/Met Date Met:  10/15/14

## 2014-10-15 NOTE — Progress Notes (Signed)
VASCULAR LAB PRELIMINARY  PRELIMINARY  PRELIMINARY  PRELIMINARY  Carotid duplex completed.    Preliminary report:  Bilateral:  1-39% ICA stenosis.  Vertebral artery flow is antegrade.     Rani Idler, RVS 10/15/2014, 12:41 PM

## 2014-10-15 NOTE — Plan of Care (Signed)
Problem: Consults Goal: Ischemic Stroke Patient Education See Patient Education Module for education specifics.  Outcome: Completed/Met Date Met:  10/15/14 Goal: Skin Care Protocol Initiated - if Braden Score 18 or less If consults are not indicated, leave blank or document N/A  Outcome: Completed/Met Date Met:  10/15/14 Goal: Nutrition Consult-if indicated Outcome: Not Applicable Date Met:  88/41/66 Goal: Diabetes Guidelines if Diabetic/Glucose > 140 If diabetic or lab glucose is > 140 mg/dl - Initiate Diabetes/Hyperglycemia Guidelines & Document Interventions  Outcome: Not Applicable Date Met:  05/18/15

## 2014-10-15 NOTE — Progress Notes (Signed)
STROKE TEAM PROGRESS NOTE   HISTORY Teresa SchaumannFrances H Martinez is an 73 y.o. female with a history of hypertension and hyperlipidemia who acute onset of slurred speech and inability to move right extremities at 7:30 PM on 27th. She has no previous history of stroke nor TIA. She has not been on antiplatelet therapy. The scan of her head showed no acute intracranial abnormality. Deficits improved when EMS arrived. A recurrence of slurred speech in route to the emergency room resolved prior to arrival and did not recur. NIH stroke score was 0 at that point. At 2255 she developed recurrence of slurred speech and right facial weakness. Decision was made at that point to treat with IV TPA, which was administered. Patient's deficits resolved prior to starting TPA treatment. However, because of the recurrent nature of her symptoms TPA was chosen instead of heparin. As well, if patient had become symptomatic again and heparin started, TPA would've been contraindicated.  LSN: [id number] on 10/14/2014 tPA Given: Yes   SUBJECTIVE (INTERVAL HISTORY): Patient's symptoms continue to stutter. She has had several more episodes of right-sided weakness since TPA was administered, the most recent early this morning. Episodes were not as severe per patient and lasted briefly no more than a few minutes. She reported having no current symptoms.    OBJECTIVE Temp:  [97.9 F (36.6 C)-98.4 F (36.9 C)] 97.9 F (36.6 C) (11/28 0758) Pulse Rate:  [61-80] 63 (11/28 0700) Cardiac Rhythm:  [-]  Resp:  [14-24] 18 (11/28 0700) BP: (104-133)/(47-103) 114/63 mmHg (11/28 0700) SpO2:  [95 %-99 %] 97 % (11/28 0700) Weight:  [139 lb (63.05 kg)-139 lb 5.3 oz (63.2 kg)] 139 lb 5.3 oz (63.2 kg) (11/28 0029)   Recent Labs Lab 10/14/14 2110  GLUCAP 123*    Recent Labs Lab 10/14/14 2045 10/14/14 2056  NA 144 142  K 3.6* 3.4*  CL 104 107  CO2 22  --   GLUCOSE 184* 188*  BUN 23 23  CREATININE 1.04 1.10  CALCIUM 9.0  --      Recent Labs Lab 10/14/14 2045  AST 26  ALT 12  ALKPHOS 51  BILITOT <0.2*  PROT 6.7  ALBUMIN 3.9    Recent Labs Lab 10/14/14 2045 10/14/14 2056  WBC 7.3  --   NEUTROABS 3.1  --   HGB 12.4 13.6  HCT 38.1 40.0  MCV 80.2  --   PLT 193  --    No results for input(s): CKTOTAL, CKMB, CKMBINDEX, TROPONINI in the last 168 hours.  Recent Labs  10/14/14 2045  LABPROT 13.4  INR 1.01    Recent Labs  10/15/14 0014  COLORURINE YELLOW  LABSPEC 1.020  PHURINE 6.5  GLUCOSEU NEGATIVE  HGBUR NEGATIVE  BILIRUBINUR NEGATIVE  KETONESUR NEGATIVE  PROTEINUR NEGATIVE  UROBILINOGEN 0.2  NITRITE NEGATIVE  LEUKOCYTESUR NEGATIVE       Component Value Date/Time   CHOL 215* 10/15/2014 0150   TRIG 158* 10/15/2014 0150   HDL 51 10/15/2014 0150   CHOLHDL 4.2 10/15/2014 0150   VLDL 32 10/15/2014 0150   LDLCALC 132* 10/15/2014 0150   No results found for: HGBA1C No results found for: LABOPIA, COCAINSCRNUR, LABBENZ, AMPHETMU, THCU, LABBARB  No results for input(s): ETH in the last 168 hours.  Ct Angio Head W/cm &/or Wo Cm 10/15/2014     CTA NECK: Approximately 50% stenosis of the RIGHT internal carotid artery origin by NASCET criteria.  Luminal irregularity of the bilateral cervical internal carotid arteries may be seen with fibromuscular  dysplasia.  Mild luminal regularity of the vertebral arteries is suggest atherosclerosis and, extrinsic compression due to cervical spine degenerative change.  Lobulated, irregularly enhancing RIGHT parotid gland, recommend correlation with physical examination and, consider MRI of the neck/parotid protocol.    CTA HEAD:  No large vessel occlusion.  Severe calcific atherosclerosis of the carotid siphon. Intracranial luminal irregularity most in keeping with atherosclerosis.     Ct Head (brain) Wo Contrast 10/15/2014   ADDENDUM: The described old insular infarcts probably involve the external capsules rather than the insular cortex. There is also  periventricular low density suggesting chronic small vessel disease.      10/15/2014   Negative for acute hemorrhage. Probable old insults in the insular cortex.    PHYSICAL EXAM  PHYSICAL EXAM Physical exam: Exam: Gen: NAD Eyes: anicteric sclerae, moist conjunctivae                    CV: RRR, no MRG Mental Status: Alert, Not following commands  Neuro: Detailed Neurologic Exam  Speech:    No aphasia, no dysarthria  Cranial Nerves:    The pupils are equal, round, and reactive to light. Visual fields are intact. Face symmetric. Eyes conjugate.   Motor Observation:    no involuntary movements noted. Tone normal.    Strength:   Right deltoid 4+/5 otherwise strength is intact.       Sensation:     Intact to LT    ASSESSMENT/PLAN Teresa Martinez is a 73 y.o. female with history of hypertension and hyperlipidemia presenting with slurred speech and right hemiplegia. She did receive IV t-PA 10/14/2014 at 2315 due to symptoms noted above.  Stroke:  Dominant  infarct -   Resultant - speech difficulties and right hemiplegia  MRI - pending  MRA - pending  Carotid Doppler - pending  2D Echo pending  LDL 132  HgbA1c pending  SCDs for VTE prophylaxis  Diet Heart with thin liquids  No antithrombotics prior to admission, now on no antithrombotics secondary to TPA  Ongoing aggressive stroke risk factor management  Therapy recommendations:  Pending  Disposition:  Pending  Hypertension  Home meds:  Lisinopril/hydrochlorothiazide  Mildly low at times  Hyperlipidemia  Home meds:  No lipid lowering medications prior to admission. Allergy to Pravachol has been documented.  LDL 132, goal < 70  History of statin intolerance    Other Stroke Risk Factors  Advanced age  Family hx stroke (mother)   Other Active Problems  Hypokalemia - check again in a.m.  Initiate antiplatelet therapy 24 hours after TPA his head CT negative for  bleed.  Other Pertinent History    Hospital day # 1  Personally examined patient and images, agree with history, physical, neuro exam as stated above. Agree with assessment and plan.   Naomie DeanAntonia Lashaun Krapf, MD [facility] Delton Seeavid Rinehuls PA-C [facility] Pager [phone] 10/15/2014, 8:36 AM     To contact Stroke Continuity provider, please refer to [email or web address]. After hours, contact General Neurology

## 2014-10-15 NOTE — Plan of Care (Signed)
Problem: tPA Day Progression Outcomes-Only if tPA administered Goal: Pre tPA two IV sites established Outcome: Completed/Met Date Met:  10/15/14 Goal: Pre tPA foley catheter inserted Outcome: Completed/Met Date Met:  10/15/14 Goal: Pre tPA panda/NGT inserted if need anticipated Outcome: Not Applicable Date Met:  17/00/17 Goal: Post tPA VS, neuro checks Outcome: Progressing Goal: Post tPA keep BP </equal 180/105 - see orders for mgmt Outcome: Progressing

## 2014-10-16 ENCOUNTER — Inpatient Hospital Stay (HOSPITAL_COMMUNITY): Payer: Medicare PPO

## 2014-10-16 ENCOUNTER — Encounter (HOSPITAL_COMMUNITY): Payer: Self-pay | Admitting: *Deleted

## 2014-10-16 DIAGNOSIS — I6350 Cerebral infarction due to unspecified occlusion or stenosis of unspecified cerebral artery: Secondary | ICD-10-CM | POA: Insufficient documentation

## 2014-10-16 DIAGNOSIS — I639 Cerebral infarction, unspecified: Secondary | ICD-10-CM | POA: Insufficient documentation

## 2014-10-16 LAB — BASIC METABOLIC PANEL
Anion gap: 12 (ref 5–15)
BUN: 13 mg/dL (ref 6–23)
CO2: 21 mEq/L (ref 19–32)
Calcium: 8.2 mg/dL — ABNORMAL LOW (ref 8.4–10.5)
Chloride: 107 mEq/L (ref 96–112)
Creatinine, Ser: 0.7 mg/dL (ref 0.50–1.10)
GFR calc Af Amer: >90 mL/min (ref >90)
GFR calc non Af Amer: 83 mL/min — ABNORMAL LOW (ref >90)
Glucose, Bld: 121 mg/dL — ABNORMAL HIGH (ref 70–99)
Potassium: 3.7 mEq/L (ref 3.7–5.3)
Sodium: 140 mEq/L (ref 137–147)

## 2014-10-16 MED ORDER — IOHEXOL 300 MG/ML  SOLN
100.0000 mL | Freq: Once | INTRAMUSCULAR | Status: AC | PRN
  Administered 2014-10-16: 100 mL via INTRAVENOUS

## 2014-10-16 MED ORDER — IOHEXOL 300 MG/ML  SOLN
25.0000 mL | INTRAMUSCULAR | Status: AC
  Administered 2014-10-16 (×2): 25 mL via ORAL

## 2014-10-16 MED ORDER — PANTOPRAZOLE SODIUM 40 MG PO TBEC
40.0000 mg | DELAYED_RELEASE_TABLET | Freq: Every day | ORAL | Status: DC
  Administered 2014-10-16 – 2014-10-18 (×3): 40 mg via ORAL
  Filled 2014-10-16 (×3): qty 1

## 2014-10-16 NOTE — Progress Notes (Signed)
Utilization Review Completed.   Alanis Clift, RN, BSN Nurse Case Manager  

## 2014-10-16 NOTE — Progress Notes (Signed)
Patient will not call staff to assist with going to the bathroom. Patient and family have been educated on fall prevention and safety. Patient continues to get out of bed without notifying staff or waiting for assistance.

## 2014-10-16 NOTE — Progress Notes (Signed)
STROKE TEAM PROGRESS NOTE   HISTORY Teresa Martinez is an 73 y.o. female with a history of hypertension and hyperlipidemia who acute onset of slurred speech and inability to move right extremities at 7:30 PM on 27th. She has no previous history of stroke nor TIA. She has not been on antiplatelet therapy. The scan of her head showed no acute intracranial abnormality. Deficits improved when EMS arrived. A recurrence of slurred speech in route to the emergency room resolved prior to arrival and did not recur. NIH stroke score was 0 at that point. At 2255 she developed recurrence of slurred speech and right facial weakness. Decision was made at that point to treat with IV TPA, which was administered. Patient's deficits resolved prior to starting TPA treatment. However, because of the recurrent nature of her symptoms TPA was chosen instead of heparin. As well, if patient had become symptomatic again and heparin started, TPA would've been contraindicated.  LSN: [id number] on 10/14/2014 tPA Given: Yes   SUBJECTIVE (INTERVAL HISTORY):       OBJECTIVE Temp:  [97.6 F (36.4 C)-97.9 F (36.6 C)] 97.6 F (36.4 C) (11/29 0400) Pulse Rate:  [57-70] 65 (11/29 0700) Cardiac Rhythm:  [-] Normal sinus rhythm (11/29 0400) Resp:  [13-25] 18 (11/29 0700) BP: (92-143)/(40-111) 134/111 mmHg (11/29 0700) SpO2:  [95 %-100 %] 96 % (11/29 0700)   Recent Labs Lab 10/14/14 2110  GLUCAP 123*    Recent Labs Lab 10/14/14 2045 10/14/14 2056 10/16/14 0204  NA 144 142 140  K 3.6* 3.4* 3.7  CL 104 107 107  CO2 22  --  21  GLUCOSE 184* 188* 121*  BUN 23 23 13   CREATININE 1.04 1.10 0.70  CALCIUM 9.0  --  8.2*    Recent Labs Lab 10/14/14 2045  AST 26  ALT 12  ALKPHOS 51  BILITOT <0.2*  PROT 6.7  ALBUMIN 3.9    Recent Labs Lab 10/14/14 2045 10/14/14 2056  WBC 7.3  --   NEUTROABS 3.1  --   HGB 12.4 13.6  HCT 38.1 40.0  MCV 80.2  --   PLT 193  --    No results for input(s): CKTOTAL,  CKMB, CKMBINDEX, TROPONINI in the last 168 hours.  Recent Labs  10/14/14 2045  LABPROT 13.4  INR 1.01    Recent Labs  10/15/14 0014  COLORURINE YELLOW  LABSPEC 1.020  PHURINE 6.5  GLUCOSEU NEGATIVE  HGBUR NEGATIVE  BILIRUBINUR NEGATIVE  KETONESUR NEGATIVE  PROTEINUR NEGATIVE  UROBILINOGEN 0.2  NITRITE NEGATIVE  LEUKOCYTESUR NEGATIVE       Component Value Date/Time   CHOL 215* 10/15/2014 0150   TRIG 158* 10/15/2014 0150   HDL 51 10/15/2014 0150   CHOLHDL 4.2 10/15/2014 0150   VLDL 32 10/15/2014 0150   LDLCALC 132* 10/15/2014 0150   Lab Results  Component Value Date   HGBA1C 6.0* 10/15/2014   No results found for: LABOPIA, COCAINSCRNUR, LABBENZ, AMPHETMU, THCU, LABBARB  No results for input(s): ETH in the last 168 hours.   2-D echocardiogram 10/15/2014 Left ventricle: The cavity size was normal. Wall thickness was normal. Systolic function was normal. The estimated ejection fraction was in the range of 55% to 60%. Wall motion was normal; there were no regional wall motion abnormalities. Features are consistent with a pseudonormal left ventricular filling pattern, with concomitant abnormal relaxation and increased filling pressure (grade 2 diastolic dysfunction). - Atrial septum: There was redundancy of the septum, with borderline criteria for aneurysm.   Ct Angio  Head W/cm &/or Wo Cm 10/15/2014     CTA NECK: Approximately 50% stenosis of the RIGHT internal carotid artery origin by NASCET criteria.  Luminal irregularity of the bilateral cervical internal carotid arteries may be seen with fibromuscular dysplasia.  Mild luminal regularity of the vertebral arteries is suggest atherosclerosis and, extrinsic compression due to cervical spine degenerative change.  Lobulated, irregularly enhancing RIGHT parotid gland, recommend correlation with physical examination and, consider MRI of the neck/parotid protocol.    CTA HEAD:  No large vessel occlusion.   Severe calcific atherosclerosis of the carotid siphon. Intracranial luminal irregularity most in keeping with atherosclerosis.     Ct Head (brain) Wo Contrast 10/15/2014   ADDENDUM: The described old insular infarcts probably involve the external capsules rather than the insular cortex. There is also periventricular low density suggesting chronic small vessel disease.     10/15/2014   Negative for acute hemorrhage. Probable old insults in the insular cortex.   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 Recommend CT neck with contrast for further evaluation. Neoplasm and lymphoma in the differential. Moderate to severe intracranial atherosclerotic disease. No large vessel occlusion.  CT soft tissue neck with contrast 10/15/2014 The right parotid gland is diffusely abnormal with a lobular contour over and replacement of normal glandular tissue with lobular soft tissue density. No associated calcification in the gland. Atrophy of the deep lobe of the right parotid gland. No adenopathy. Remains salivary glands are normal. Differential diagnosis includes neoplasm such as lymphoma of the parotid gland. Primary carcinoma or metastatic disease to the gland considered less likely. Chronic inflammation of the gland is a consideration. Biopsy will be necessary for diagnosis. Right thyroid nodules.  CT of the head without contrast 10/15/2014 1. No evidence of intracranial hemorrhage or new acute abnormality following TPA administration. 2. Subtle hypoattenuation in the left hemi pons consistent with known acute infarct. Abnormality better demonstrated on recent MRI. Right thyroid nodules.   PHYSICAL EXAM  PHYSICAL EXAM Physical exam: Exam: Gen: NAD Eyes: anicteric sclerae, moist conjunctivae                     CV: RRR, no MRG Mental Status: Alert, Not following commands  Neuro: No changes today on exam, still with mild right-sided weakness Detailed Neurologic Exam  Speech:    No aphasia, no dysarthria  Cranial Nerves:    The pupils are equal, round, and reactive to light. EOMI. Visual fields are intact. Face symmetric. Eyes conjugate. Tomgue midline.  Motor Observation:    no involuntary movements noted. Tone normal. Reflexes symmetric.     Strength:   Right deltoid 4+/5 otherwise strength is intact.       Sensation:     Intact to LT    Gait: Could not test    ASSESSMENT/PLAN Ms. Teresa Martinez is a 73 y.o. female with history of hypertension and hyperlipidemia presenting with slurred speech and right hemiplegia. She did receive IV t-PA 10/14/2014 at 2315 due to symptoms noted above. MRI - Acute infarct left pons.  Stroke:  Dominant  infarct -   Resultant - speech difficulties and right hemiplegia, resolving  MRI - see above  MRA - see above  Carotid Doppler - Bilateral: 1-39% ICA stenosis. Vertebral artery flow is antegrade.  2D Echo - as above - no  cardiac source of emboli identified. Atrial septum: There was    redundancy of the septum, with borderline criteria for aneurysm.  LDL 132  HgbA1c 6.0  SCDs for VTE prophylaxis  Diet Heart with thin liquids  No antithrombotics prior to admission, now on Aspirin 325 mg daily.  Ongoing aggressive stroke risk factor management  Therapy recommendations:  Pending  Disposition:  Pending  Hypertension  Home meds:  Lisinopril/hydrochlorothiazide  Mildly low at times  Hyperlipidemia  Home meds:  No lipid lowering medications prior to admission. Allergy to Pravachol has been documented.  LDL 132, goal < 70  History of statin intolerance  Fibromuscular dysplasia ?Marland Kitchen There is a family history, daughter with FMD.   Other Stroke Risk Factors  Advanced age  Family hx stroke (mother)   Other Active  Problems  Hypokalemia - resolved.  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  Should follow up       outpatient with ENT.    CTA showed possible vertebral artery extrinsic compression due to cervical spine        degenerative changes, will CT cervical spine   Transfer out of unit today.  Hospital day # 2  Delton See PA-C [facility] Pager [phone] 10/16/2014, 8:46 AM  Personally examined patient and images, agree with history, physical, neuro exam as stated above. Agree with assessment and plan.   Naomie Dean, MD [facility]     To contact Stroke Continuity provider, please refer to [email or web address]. After hours, contact General Neurology

## 2014-10-16 NOTE — Plan of Care (Signed)
Problem: tPA Day Progression Outcomes-Only if tPA administered Goal: Pre tPA NIH Stroke Scale documented Outcome: Completed/Met Date Met:  10/16/14 Goal: Post tPA no anticoagulants/antiplatelets 24 hrs Outcome: Completed/Met Date Met:  10/16/14 Goal: Post tPA keep BP </equal 180/105 - see orders for mgmt Outcome: Completed/Met Date Met:  10/16/14 Goal: Post tPA CT brain w/o contrast 24 hrs post tPA Outcome: Completed/Met Date Met:  10/16/14 Goal: Post tPA bedrest for 24 hours Outcome: Completed/Met Date Met:  10/16/14

## 2014-10-16 NOTE — Plan of Care (Signed)
Problem: tPA Day Progression Outcomes-Only if tPA administered Goal: Post tPA VS, neuro checks Outcome: Completed/Met Date Met:  10/16/14 Goal: Post tPA fall precautions Outcome: Completed/Met Date Met:  10/16/14 Goal: Post tPA monitor for S/S of bleeding Outcome: Completed/Met Date Met:  10/16/14 Goal: Post tPA image without hemorrhage Outcome: Completed/Met Date Met:  10/16/14 Goal: Post tPA no S/S of hemorrhage elsewhere Outcome: Completed/Met Date Met:  10/16/14 Goal: Post tPA neurologically at baseline or improved CRITERIA FOR NEUROLOGICALLY AT BASELINE OR IMPROVED: - NO S/S OF INC. ICP - AWAKE, ALERT, ORIENTED X3 - SPEECH CLEAR, APPROPRIATE - PERRL - EOMS, BLINK INTACT - FACE SYMMETRICAL - TONGUE/TRACH MIDLINE - GRIPS, PUSH/PULL EQUAL - NO PRONATOR DRIFT - DORSIPLANTAR FLEXION EQUAL - MOVES ALL EXTREMITIES - SENSATION INTACT - NO NUCHAL RIGIDITY OR PHOTOPHOBIA  Outcome: Completed/Met Date Met:  10/16/14 Goal: Post tPA neuro decline/bleeding complications guide BLEEDING COMPLICATION GUIDE: - STOP TPA - NOTIFY MD ASAP - IF COMPRESSIBLE, HOLD DIRECT PRESSURE - IF NONCOMPRESSIBLE OR UNCONTROLLED BLEEDS, ANTICIPATE MD MAY ORDER STAT:  --- 5 UNITS CRYOPRECIPITATE,  --- 2-3 UNITS FFP IF NEEDED,  --- PLATELET TRANSFUSION IF NEEDED,  --- PRBCS IF NEEDED  Outcome: Completed/Met Date Met:  10/16/14 Goal: Other tPA Day Goals/Outcomes Outcome: Completed/Met Date Met:  10/16/14  Problem: Acute Treatment Outcomes Goal: Neuro exam at baseline or improved Outcome: Completed/Met Date Met:  10/16/14 Goal: BP within ordered parameters Outcome: Completed/Met Date Met:  10/16/14 Goal: Airway maintained/protected Outcome: Completed/Met Date Met:  10/16/14 Goal: 02 Sats > 94% Outcome: Completed/Met Date Met:  10/16/14 Goal: Hemodynamically stable Outcome: Completed/Met Date Met:  10/16/14 Goal: tPA Patient w/o S&S of bleeding Outcome: Completed/Met Date Met:  10/16/14 Goal:  Prognosis discussed with family/patient as appropriate Outcome: Completed/Met Date Met:  10/16/14 Goal: Other Acute Treatment Outcomes Outcome: Completed/Met Date Met:  10/16/14  Problem: Progression Outcomes Goal: Communication method established Outcome: Completed/Met Date Met:  10/16/14 Goal: If vent dependent, tolerates weaning Outcome: Not Applicable Date Met:  11/88/67 Goal: Tolerating diet/TF at goal rate Outcome: Completed/Met Date Met:  10/16/14 Goal: Pain controlled Outcome: Not Applicable Date Met:  73/73/66 Goal: Bowel & Bladder Continence Outcome: Completed/Met Date Met:  10/16/14

## 2014-10-16 NOTE — Evaluation (Signed)
Physical Therapy Evaluation Patient Details Name: Linus OrnFrances H Mintz-Whitcomb MRN: [id number] DOB: 06/10/1941 Today's Date: 10/16/2014   History of Present Illness  Dennard SchaumannFrances H Mintz-Whitcomb is an 73 y.o. female with a history of hypertension and hyperlipidemia who acute onset of slurred speech and inability to move right extremities at 7:30 PM on 27th. She has no previous history of stroke nor TIA. She has not been on antiplatelet therapy. The scan of her head showed no acute intracranial abnormality. Deficits improved when EMS arrived. A recurrence of slurred speech in route to the emergency room resolved prior to arrival and did not recur. NIH stroke score was 0 at that point. At 2255 she developed recurrence of slurred speech and right facial weakness. Decision was made at that point to treat with IV TPA, which was administered. Patient's deficits resolved prior to starting TPA treatment. However, because of the recurrent nature of her symptoms TPA was chosen instead of heparin.  Work up continues, MRI shows infarct acute L Pons.  Clinical Impression  Pt admitted with/for s/s of stroke, found by MRI to be a left pons infarct.  Pt currently limited functionally due to the problems listed. ( See problems list.)   Pt will benefit from PT to maximize function and safety in order to get ready for next venue listed below.     Follow Up Recommendations CIR    Equipment Recommendations  Other (comment) (TBA next venue)    Recommendations for Other Services Rehab consult     Precautions / Restrictions Precautions Precautions: Fall Restrictions Weight Bearing Restrictions: No      Mobility  Bed Mobility               General bed mobility comments: not tested  Transfers Overall transfer level: Needs assistance Equipment used: 1 person hand held assist Transfers: Sit to/from Stand Sit to Stand: Min assist         General transfer comment: stability  assist  Ambulation/Gait Ambulation/Gait assistance: Min assist (occasional light mod assist with fatigue) Ambulation Distance (Feet): 110 Feet Assistive device: 1 person hand held assist Gait Pattern/deviations: Step-through pattern;Scissoring;Shuffle;Ataxic Gait velocity: slow   General Gait Details: mildly unsteady overall.  mild to moderate lean to Right worsening with fatigue.    Stairs            Wheelchair Mobility    Modified Rankin (Stroke Patients Only) Modified Rankin (Stroke Patients Only) Pre-Morbid Rankin Score: No symptoms Modified Rankin: Moderately severe disability     Balance Overall balance assessment: Needs assistance Sitting-balance support: No upper extremity supported Sitting balance-Leahy Scale: Fair     Standing balance support: Single extremity supported Standing balance-Leahy Scale: Poor Standing balance comment: list to Right                             Pertinent Vitals/Pain Pain Assessment: No/denies pain    Home Living Family/patient expects to be discharged to:: Private residence Living Arrangements: Other (Comment) (who is often not at home due to works out of city.) Available Help at Discharge: Other (Comment) (family PRN) Type of Home: House Home Access: Stairs to enter Entrance Stairs-Rails: Doctor, general practiceight;Left Entrance Stairs-Number of Steps: several Home Layout: One level Home Equipment: None      Prior Function Level of Independence: Independent         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     Hand Dominance        Extremity/Trunk Assessment  Upper Extremity Assessment: Defer to OT evaluation (mild weakness in grip, bicep/triceps and shd flexion)           Lower Extremity Assessment: Generalized weakness;Overall [facility] tasks assessed (hip flexor and quads mildly weak at >4/5)      Cervical / Trunk Assessment: Normal  Communication   Communication: No difficulties  Cognition Arousal/Alertness:  Awake/alert Behavior During Therapy: Flat affect;WFL for tasks assessed/performed Overall Cognitive Status: Within Functional Limits for tasks assessed                      General Comments      Exercises        Assessment/Plan    PT Assessment Patient needs continued PT services  PT Diagnosis Difficulty walking;Other (comment) (hemiparesis dominant side)   PT Problem List Decreased strength;Decreased activity tolerance;Decreased balance;Decreased mobility;Decreased coordination;Decreased knowledge of use of DME  PT Treatment Interventions DME instruction;Gait training;Functional mobility training;Therapeutic activities;Balance training;Patient/family education   PT Goals (Current goals can be found in the Care Plan section) Acute Rehab PT Goals Patient Stated Goal: back to running PT Goal Formulation: With patient Time For Goal Achievement: 10/16/14 Potential to Achieve Goals: Good    Frequency Min 4X/week   Barriers to discharge        Co-evaluation               End of Session   Activity Tolerance: Patient tolerated treatment well Patient left: in chair;with call bell/phone within reach;with family/visitor present Nurse Communication: Mobility status         Time: [id number] PT Time Calculation (min) (ACUTE ONLY): 25 min   Charges:   PT Evaluation $Initial PT Evaluation Tier I: 1 Procedure PT Treatments $Gait Training: 8-22 mins   PT G Codes:          Brandon Wiechman, Eliseo GumKenneth V 10/16/2014, 11:08 AM 10/16/2014  [place] BingKen Forrestine Lecrone, PT [phone]  (pager)

## 2014-10-16 NOTE — Plan of Care (Signed)
Problem: tPA Day Progression Outcomes-Only if tPA administered Goal: Post tPA call MD if neuro decline - plan for STAT CT Outcome: Not Applicable Date Met:  79/90/94

## 2014-10-17 DIAGNOSIS — G450 Vertebro-basilar artery syndrome: Secondary | ICD-10-CM

## 2014-10-17 DIAGNOSIS — I6302 Cerebral infarction due to thrombosis of basilar artery: Secondary | ICD-10-CM

## 2014-10-17 DIAGNOSIS — S46001A Unspecified injury of muscle(s) and tendon(s) of the rotator cuff of right shoulder, initial encounter: Secondary | ICD-10-CM

## 2014-10-17 DIAGNOSIS — I6350 Cerebral infarction due to unspecified occlusion or stenosis of unspecified cerebral artery: Secondary | ICD-10-CM

## 2014-10-17 MED ORDER — TRAMADOL HCL 50 MG PO TABS
100.0000 mg | ORAL_TABLET | Freq: Three times a day (TID) | ORAL | Status: DC | PRN
  Administered 2014-10-17 (×2): 100 mg via ORAL
  Filled 2014-10-17 (×2): qty 2

## 2014-10-17 MED ORDER — BISACODYL 5 MG PO TBEC
5.0000 mg | DELAYED_RELEASE_TABLET | Freq: Every day | ORAL | Status: DC | PRN
  Filled 2014-10-17 (×2): qty 1

## 2014-10-17 MED ORDER — ENOXAPARIN SODIUM 40 MG/0.4ML ~~LOC~~ SOLN
40.0000 mg | Freq: Every day | SUBCUTANEOUS | Status: DC
  Administered 2014-10-17 – 2014-10-19 (×3): 40 mg via SUBCUTANEOUS
  Filled 2014-10-17 (×3): qty 0.4

## 2014-10-17 NOTE — Progress Notes (Signed)
Occupational Therapy Evaluation Patient Details Name: Teresa Martinez MRN: [id number] DOB: 04/06/1941 Today's Date: 10/17/2014    History of Present Illness 73 y.o. female admitted with onset of slurred speech and inability to move R extremities at 7:30PM on 10/14/14. NIH= 0 Symptoms resolved in route with EMS. At 2255 she developed recurrence of slurred speech and right facial weakness. Decision was made at that point to treat with IV TPA, which was administered MRI (+) L Pons PMH: HTN, Hyperlipidemia   Clinical Impression   Patient with R side weakness, balance deficits, and acute pain R shoulder. Pt unable to use RW due to increased pain in R shoulder. Pt required 1 person hand-held assist for transfers and ambulation due to R lateral lean and unsteady gait. Pt will benefit from CIR for continued therapy services at d/c to maximize independence and safety with ADLs and mobility. Will follow acutely to address OT goals.    Follow Up Recommendations  CIR    Equipment Recommendations  Other (comment) (TBD in next venue)    Recommendations for Other Services Rehab consult     Precautions / Restrictions Precautions Precautions: Fall Restrictions Weight Bearing Restrictions: No      Mobility Bed Mobility Overal bed mobility: Needs Assistance Bed Mobility: Supine to Sit     Supine to sit: Min guard     General bed mobility comments: HOB flat; use of bedrails  Transfers Overall transfer level: Needs assistance Equipment used: 1 person hand held assist Transfers: Sit to/from Stand Sit to Stand: Mod assist         General transfer comment: Min-mod (A) for balance    Balance Overall balance assessment: Needs assistance Sitting-balance support: No upper extremity supported;Feet supported Sitting balance-Leahy Scale: Fair     Standing balance support: Single extremity supported;During functional activity Standing balance-Leahy Scale: Poor Standing balance  comment: R lateral lean with R knee buckling                            ADL Overall ADL's : Needs assistance/impaired     Grooming: Wash/dry hands;Minimal assistance;Standing Grooming Details (indicate cue type and reason): Min (A) to maintain balance due to R lateral lean. Verbal cues provided to stand tall and straighten R knee                 Toilet Transfer: Minimal assistance;Ambulation;Regular Toilet;Grab bars Toilet Transfer Details (indicate cue type and reason): Min (A) to stabilize balance Toileting- Clothing Manipulation and Hygiene: Minimal assistance;Sitting/lateral lean Toileting - Clothing Manipulation Details (indicate cue type and reason): Min (A) to stabilize balance     Functional mobility during ADLs: Moderate assistance General ADL Comments: 1 person hand-held assist for ambulation. Pt impulsive and demonstrating significant R lateral lean during standing and dynamic balance tasks. Verbal cues throughout session for safety and to compensate for R lean.     Vision                     Perception     Praxis      Pertinent Vitals/Pain Pain Assessment: 0-10 Pain Score: 4  Pain Location: R shoulder Pain Descriptors / Indicators: Sore Pain Intervention(s): Limited activity within patient's tolerance;Monitored during session;Repositioned     Hand Dominance     Extremity/Trunk Assessment Upper Extremity Assessment Upper Extremity Assessment: RUE deficits/detail RUE Deficits / Details: Mild weakness in grip, decr AROM RUE Coordination: decreased fine motor;decreased gross motor  Lower Extremity Assessment Lower Extremity Assessment: Defer to PT evaluation   Cervical / Trunk Assessment Cervical / Trunk Assessment: Normal   Communication Communication Communication: No difficulties   Cognition Arousal/Alertness: Awake/alert Behavior During Therapy: Flat affect;Impulsive Overall Cognitive Status: Within Functional Limits for  tasks assessed                     General Comments       Exercises       Shoulder Instructions      Home Living Family/patient expects to be discharged to:: Private residence                   Bathroom Shower/Tub: Walk-in shower;Door   Foot LockerBathroom Toilet: Standard     Home Equipment: Shower seat - built in;Hand held shower head          Prior Functioning/Environment               OT Diagnosis: Generalized weakness;Hemiplegia dominant side   OT Problem List: Decreased strength;Decreased range of motion;Decreased activity tolerance;Impaired balance (sitting and/or standing);Decreased coordination;Decreased safety awareness;Decreased knowledge of use of DME or AE;Pain   OT Treatment/Interventions: Self-care/ADL training;Therapeutic exercise;DME and/or AE instruction;Neuromuscular education;Therapeutic activities;Patient/family education;Balance training    OT Goals(Current goals can be found in the care plan section) Acute Rehab OT Goals Patient Stated Goal: to not be dependent on anyone OT Goal Formulation: With patient Time For Goal Achievement: 10/31/14 Potential to Achieve Goals: Good ADL Goals Pt Will Perform Grooming: with min guard assist;standing Pt Will Perform Upper Body Bathing: with supervision;sitting Pt Will Perform Lower Body Bathing: with min guard assist;sit to/from stand Pt Will Transfer to Toilet: with min guard assist;ambulating;regular height toilet Pt Will Perform Toileting - Clothing Manipulation and hygiene: with min guard assist;sit to/from stand  OT Frequency: Min 3X/week   Barriers to D/C:            Co-evaluation              End of Session Equipment Utilized During Treatment: Gait belt Nurse Communication: Mobility status;Precautions  Activity Tolerance: Patient tolerated treatment well Patient left: in chair;with call bell/phone within reach;with chair alarm set   Time: 1130-1148 OT Time Calculation (min):  18 min Charges:    G-Codes:    Nils PyleBermel, Elaiza Shoberg 10/17/2014, 1:44 PM

## 2014-10-17 NOTE — Plan of Care (Signed)
Problem: Progression Outcomes Goal: Progressive activity as tolerated Outcome: Progressing     

## 2014-10-17 NOTE — Plan of Care (Signed)
Problem: Progression Outcomes Goal: Initial discharge plan initiated Outcome: Completed/Met Date Met:  10/17/14

## 2014-10-17 NOTE — Consult Note (Signed)
Physical Medicine and Rehabilitation Consult   Reason for Consult: Right sided weakness and slurred speech Referring Physician: Dr. Pearlean Brownie.    HPI: Teresa Martinez is a 73 y.o. RH-female with history of HTN, hyperlipidemia who was admitted on 10/14/14 with acute onset of slurred speech and right sided weakness. Patient with sputtering symptoms and was treated with tPA due to recurrent nature of symptoms. CTA head/neck with 50% stenosis of R-ICA with luminal irregularity of bilateral ICA question fibromuscular dysplasia, severe calcific atherosclerosis of the carotid siphon and mild luminal irregularity of VA with extrinsic compression due to cervical spine degenerative changes. MRI/MRA brain done revealing acute left pontine infarct with lobular mass right parotid gland.  CT cervical spine with right parotid mass, right thyroid nodules and cervical spondylosis. CT abdomen/pelvis without significant abnormality. 2D echo with EF 55-60& with no wall abnormality but grade 2 diastolic dysfunction and redundancy of atrial septum with borderline criteria for aneurysm. Patient was started on ASA for secondary stroke prevention. Patient with resultant mild right hemiparesis as well as right lean when fatigue with ambulation. CIR recommended by MD and rehab team.    Review of Systems  HENT: Negative for hearing loss.   Eyes: Negative for blurred vision and double vision.  Respiratory: Negative for cough and shortness of breath.   Cardiovascular: Negative for chest pain and palpitations.  Gastrointestinal: Negative for heartburn, nausea and abdominal pain.  Genitourinary: Positive for urgency and frequency.  Musculoskeletal: Positive for joint pain (right shoulder with acute on chronic pain).  Neurological: Positive for speech change and focal weakness. Negative for headaches.      Past Medical History  Diagnosis Date  . Hyperlipemia   . Statin intolerance   . Menopausal syndrome     [name])  . Hypertension   . Overactive bladder     Past Surgical History  Procedure Laterality Date  . Cholecystectomy      1950  . Band hemorrhoidectomy      2010  . Parotidectomy      30 years ago  . Abdominal hysterectomy      1985    Family History  Problem Relation Age of Onset  . Heart failure Father   . Diabetes Mother   . Hypertension Mother   . Stroke Mother   . Cirrhosis Sister   . Hyperlipidemia Brother   . Hypertension Brother     Social History:  Married. 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  She reports that she has never smoked. She has never used smokeless tobacco. She has a glass of wine occasionally. She reports that she does not use illicit drugs.    Allergies  Allergen Reactions  . Pravastatin Other (See Comments)    Leg pains  . Latex Other (See Comments)    Burns her skin   Medications Prior to Admission  Medication Sig Dispense Refill  . b complex vitamins tablet Take 1 tablet by mouth daily.    . Calcium Carb-Cholecalciferol (CALCIUM+D3) 600-800 MG-UNIT TABS Take 1 tablet by mouth daily.    . Cholecalciferol (VITAMIN D PO) Take 1 tablet by mouth daily.    Marland Kitchen estradiol (ESTRACE) 0.1 MG/GM vaginal cream Place 1 Applicatorful vaginally once a week.    . fesoterodine (TOVIAZ) 4 MG TB24 tablet Take 1 tablet (4 mg total) by mouth daily. (Patient taking differently: Take 4 mg by mouth daily as needed (urination  frequency). ) 60 tablet 5  . lisinopril-hydrochlorothiazide (PRINZIDE,ZESTORETIC) 20-12.5 MG per tablet Take 1 tablet by mouth daily. 90 tablet 6  . loratadine (CLARITIN) 10 MG tablet Take 10 mg by mouth daily.    . Multiple Vitamin (MULTIVITAMIN WITH MINERALS) TABS tablet Take 1 tablet by mouth daily.    . naproxen sodium (ALEVE) 220 MG tablet Take 440 mg by mouth daily as needed (pain/headaches).    . Omega-3 Fatty Acids (FISH OIL) 1000 MG CAPS Take 1,000  mg by mouth daily.    . pravastatin (PRAVACHOL) 20 MG tablet Take 1 tablet (20 mg total) by mouth daily. (Patient not taking: Reported on 10/14/2014) 90 tablet 3    Home: Home Living Family/patient expects to be discharged to:: Private residence Living Arrangements: Other (Comment) (who is often not at home due to works out of city.) Available Help at Discharge: Other (Comment) (family PRN) Type of Home: House Home Access: Stairs to enter [employer] of Steps: several Entrance Stairs-Rails: Right, Left Home Layout: One level Home Equipment: None  Functional History: Prior Function Level of Independence: Independent 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 Functional Status:  Mobility: Bed Mobility General bed mobility comments: not tested Transfers Overall transfer level: Needs assistance Equipment used: 1 person hand held assist Transfers: Sit to/from Stand Sit to Stand: Min assist General transfer comment: stability assist Ambulation/Gait Ambulation/Gait assistance: Min assist (occasional light mod assist with fatigue) Ambulation Distance (Feet): 110 Feet Assistive device: 1 person hand held assist Gait Pattern/deviations: Step-through pattern, Scissoring, Shuffle, Ataxic Gait velocity: slow General Gait Details: mildly unsteady overall.  mild to moderate lean to Right worsening with fatigue.      ADL:    Cognition: Cognition Overall Cognitive Status: Within Functional Limits for tasks assessed Orientation Level: Oriented X4 Cognition Arousal/Alertness: Awake/alert Behavior During Therapy: Flat affect, WFL for tasks assessed/performed Overall Cognitive Status: Within Functional Limits for tasks assessed  Blood pressure 119/50, pulse 73, temperature 98 F (36.7 C), temperature source Oral, resp. rate 20, height 5\' 6"  (1.676 m), weight 63.2 kg (139 lb 5.3 oz), SpO2 100 %. Physical Exam  Nursing note and vitals reviewed. Constitutional: She is oriented to person, place,  and time. She appears well-developed and well-nourished.  HENT:  Head: Normocephalic and atraumatic.  Eyes: Conjunctivae are normal. Pupils are equal, round, and reactive to light.  Neck: Normal range of motion. Neck supple.  Cardiovascular: Normal rate and regular rhythm.   Respiratory: Effort normal and breath sounds normal. No respiratory distress. She has no wheezes.  GI: Soft. Bowel sounds are normal. She exhibits no distension. There is no tenderness.  Neurological: She is alert and oriented to person, place, and time.  Right facial weakness with mild hesitancy. Speech clear and able to follow 2 step commands without difficulty. RLE with decrease in fine motor control and RUE weakness proximally with significant shoulder pain with movements. RLE 4/5 prox to distal. RUE 1/5 (pain) to 3/5 wrist/finger, biceps  Skin: Skin is warm and dry. No rash noted. No erythema.  Psychiatric:  Flat, some delay    No results found for this or any previous visit (from the past 24 hour(s)). Ct Head Wo Contrast  10/15/2014   CLINICAL DATA:  73 year old female status post TPA administration of 4 acute cerebral vascular accident. Right-sided weakness and dysarthria  EXAM: CT HEAD WITHOUT CONTRAST  TECHNIQUE: Contiguous axial images were obtained from the base of the skull through the vertex without intravenous contrast.  COMPARISON:  Recent CT scan of  the head 10/14/2014; brain MRI 10/15/2014  FINDINGS: No evidence of intracranial hemorrhage, mass lesion, mass effect or hydrocephalus. Ill-defined hypoattenuation in the left hemi pons consistent with known acute infarct. This is better demonstrated on the recent MRI. Incompletely imaged right parotid lesion also better demonstrated on recent prior imaging. No focal soft tissue or calvarial abnormality. Normal aeration of the mastoid air cells and visualized paranasal sinuses.  IMPRESSION: 1. No evidence of intracranial hemorrhage or new acute abnormality following  t-PA administration. 2. Subtle hypoattenuation in the left hemi pons consistent with known acute infarct. Abnormality better demonstrated on recent MRI.   Electronically Signed   By: Malachy MoanHeath  McCullough M.D.   On: 10/15/2014 22:05   Ct Soft Tissue Neck W Contrast  10/15/2014   CLINICAL DATA:  Right parotid mass on MRI and CTA neck  EXAM: CT NECK WITH CONTRAST  TECHNIQUE: Multidetector CT imaging of the neck was performed using the standard protocol following the bolus administration of intravenous contrast.  CONTRAST:  75mL OMNIPAQUE IOHEXOL 300 MG/ML  SOLN  COMPARISON:  CTA neck 10/14/2014.  MRI head 10/15/2014  FINDINGS: Lobular solid mass infiltrates most of the right parotid gland. The gland has homogeneous density and mild diffuse enhancement. No cystic change or calcification. This involves the superficial lobe but not the deep lobe. The deep lobe appears atrophic on the right.  The left parotid gland is normal.  Submandibular glands are normal.  No adenopathy in the neck.  The tongue and pharynx are normal. Epiglottis and larynx are normal.  Right thyroid nodule measures 12 x 14 mm. Posterior right thyroid nodule measures 9 mm. Small nodules in the left thyroid lobe.  Mild disc degeneration and spondylosis at C5-6. No acute bony change.  IMPRESSION: The right parotid gland is diffusely abnormal with a lobular contour over and replacement of normal glandular tissue with lobular soft tissue density. No associated calcification in the gland. Atrophy of the deep lobe of the right parotid gland. No adenopathy. Remains salivary glands are normal.  Differential diagnosis includes neoplasm such as lymphoma of the parotid gland. Primary carcinoma or metastatic disease to the gland considered less likely. Chronic inflammation of the gland is a consideration. Biopsy will be necessary for diagnosis.  Right thyroid nodules.   Electronically Signed   By: Marlan Palauharles  Clark M.D.   On: 10/15/2014 16:54   Ct Chest W  Contrast  10/16/2014   CLINICAL DATA:  Compression deformity of vertebra.  Brain cancer.  EXAM: CT CHEST, ABDOMEN, AND PELVIS WITH CONTRAST  TECHNIQUE: Multidetector CT imaging of the chest, abdomen and pelvis was performed following the standard protocol during bolus administration of intravenous contrast.  CONTRAST:  100mL OMNIPAQUE IOHEXOL 300 MG/ML  SOLN  COMPARISON:  MRI scan of head and CT scan of neck of October 15, 2014. CT scan of abdomen pelvis of November 28, 2003.  FINDINGS: CT CHEST FINDINGS  No pneumothorax or pleural effusion is noted. No acute pulmonary disease is noted. No pulmonary nodule or mass is noted. No mediastinal mass or adenopathy is noted. There is no evidence of thoracic aortic dissection or aneurysm. No significant osseous abnormality is noted in the chest.  CT ABDOMEN AND PELVIS FINDINGS  Status post cholecystectomy. No focal abnormality is noted in the liver, spleen or pancreas. Adrenal glands and kidneys appear normal. No hydronephrosis or renal obstruction is noted. The appendix appears normal. There is no evidence of bowel obstruction. Atherosclerotic calcifications of abdominal aorta and iliac arteries are noted without  aneurysm formation. No abnormal fluid collection is noted. Urinary bladder appears normal. Status post hysterectomy. No significant adenopathy is noted. Severe degenerative disc disease is noted at L5-S1.  IMPRESSION: No significant abnormality seen in the chest.  Status post cholecystectomy and hysterectomy. No acute abnormality seen in the abdomen or pelvis.   Electronically Signed   By: Roque Lias M.D.   On: 10/16/2014 16:41   Ct Cervical Spine Wo Contrast  10/16/2014   CLINICAL DATA:  Right parotid lesion. Compression deformity of vertebra.  EXAM: CT CERVICAL SPINE WITHOUT CONTRAST  TECHNIQUE: Multidetector CT imaging of the cervical spine was performed without intravenous contrast. Multiplanar CT image reconstructions were also generated.  COMPARISON:   CT neck 10/15/2014  FINDINGS: Detailed images the cervical spine were reconstructed from the CT neck images obtained on 10/15/2014. No additional radiation or scanning was necessary.  Normal cervical alignment. Negative for fracture. No spinal mass identified. No sclerotic or lytic lesion.  C1-2: Degenerative change of the dens and C1 junction with joint space narrowing, spurring and sclerosis of the dens. No erosive change.  C2-3:  Negative  C3-4:  Moderate facet degeneration left greater than right  C4-5:  Mild disc degeneration.  Bilateral mild facet degeneration  C5-6: Disc degeneration and spondylosis with diffuse uncinate spurring. This is causing mild spinal stenosis and moderate foraminal encroachment bilaterally.  C6-7: Mild disc degeneration and spondylosis. Mild facet degeneration bilaterally.  C7-T1:  Negative  Right thyroid nodules. These measure 12 x 16 mm anteriorly, and 8 mm posteriorly on the right.  Lobular mass involving the right parotid gland is described in the CT neck report  IMPRESSION: Right parotid lesion  Right thyroid nodules  Cervical spondylosis most severe at C5-6 with mild spinal stenosis and moderate foraminal encroachment bilaterally. No cervical spine fracture or mass lesion.   Electronically Signed   By: Marlan Palau M.D.   On: 10/16/2014 10:25   Mr Brain Wo Contrast  10/15/2014   CLINICAL DATA:  Stroke. TPA. Slurred speech and right-sided weakness.  EXAM: MRI HEAD WITHOUT CONTRAST  MRA HEAD WITHOUT CONTRAST  TECHNIQUE: Multiplanar, multiecho pulse sequences of the brain and surrounding structures were obtained without intravenous contrast. Angiographic images of the head were obtained using MRA technique without contrast.  COMPARISON:  CT head 10/14/2014  FINDINGS: MRI HEAD FINDINGS  Image quality degraded by mild motion.  Mild atrophy.  Ventricle size normal for Mild atrophy.  Acute infarct in the left pons with restricted diffusion. This involves much of the left pons.  Mild chronic microvascular ischemic changes in the white matter. Brainstem and cerebellum intact.  Negative for hemorrhage  No intracranial mass lesion.  Lobular contour and asymmetric density of the right parotid gland as noted on prior CTA neck. This has an unusual appearance. Neoplasm not excluded  MRA HEAD FINDINGS  Image quality degraded by motion.  Severe stenosis distal right vertebral artery. Mild disease distal left vertebral artery. Basilar artery diffusely diseased with but patent. Severe irregularity and multiple areas of stenosis in the posterior cerebral artery bilaterally.  Atherosclerotic irregularity in the cavernous carotid bilaterally without significant stenosis. Atherosclerotic irregularity throughout the anterior and middle cerebral arteries bilaterally compatible with moderately severe atherosclerotic disease. No large vessel occlusion.  Negative for cerebral aneurysm.  IMPRESSION: Acute infarct left pons  Chronic microvascular ischemic change  Lobular mass in the right parotid gland with an unusual appearance and similar that seen on prior CT. Recommend CT neck with contrast for further evaluation. Neoplasm  and lymphoma in the differential.  Moderate to severe intracranial atherosclerotic disease. No large vessel occlusion.   Electronically Signed   By: Marlan Palau M.D.   On: 10/15/2014 11:55   Ct Abdomen Pelvis W Contrast  10/16/2014   CLINICAL DATA:  Compression deformity of vertebra.  Brain cancer.  EXAM: CT CHEST, ABDOMEN, AND PELVIS WITH CONTRAST  TECHNIQUE: Multidetector CT imaging of the chest, abdomen and pelvis was performed following the standard protocol during bolus administration of intravenous contrast.  CONTRAST:  OMNIPAQUE IOHEXOL 300 MG/ML  SOLN  COMPARISON:  MRI scan of head and CT scan of neck of October 15, 2014. CT scan of abdomen pelvis of November 28, 2003.  FINDINGS: CT CHEST FINDINGS  No pneumothorax or pleural effusion is noted. No acute pulmonary  disease is noted. No pulmonary nodule or mass is noted. No mediastinal mass or adenopathy is noted. There is no evidence of thoracic aortic dissection or aneurysm. No significant osseous abnormality is noted in the chest.  CT ABDOMEN AND PELVIS FINDINGS  Status post cholecystectomy. No focal abnormality is noted in the liver, spleen or pancreas. Adrenal glands and kidneys appear normal. No hydronephrosis or renal obstruction is noted. The appendix appears normal. There is no evidence of bowel obstruction. Atherosclerotic calcifications of abdominal aorta and iliac arteries are noted without aneurysm formation. No abnormal fluid collection is noted. Urinary bladder appears normal. Status post hysterectomy. No significant adenopathy is noted. Severe degenerative disc disease is noted at L5-S1.  IMPRESSION: No significant abnormality seen in the chest.  Status post cholecystectomy and hysterectomy. No acute abnormality seen in the abdomen or pelvis.   Electronically Signed   By: Roque Lias M.D.   On: 10/16/2014 16:41   Mr Maxine Glenn Head/brain Wo Cm  10/15/2014   CLINICAL DATA:  Stroke. TPA. Slurred speech and right-sided weakness.  EXAM: MRI HEAD WITHOUT CONTRAST  MRA HEAD WITHOUT CONTRAST  TECHNIQUE: Multiplanar, multiecho pulse sequences of the brain and surrounding structures were obtained without intravenous contrast. Angiographic images of the head were obtained using MRA technique without contrast.  COMPARISON:  CT head 10/14/2014  FINDINGS: MRI HEAD FINDINGS  Image quality degraded by mild motion.  Mild atrophy.  Ventricle size normal for Mild atrophy.  Acute infarct in the left pons with restricted diffusion. This involves much of the left pons. Mild chronic microvascular ischemic changes in the white matter. Brainstem and cerebellum intact.  Negative for hemorrhage  No intracranial mass lesion.  Lobular contour and asymmetric density of the right parotid gland as noted on prior CTA neck. This has an unusual  appearance. Neoplasm not excluded  MRA HEAD FINDINGS  Image quality degraded by motion.  Severe stenosis distal right vertebral artery. Mild disease distal left vertebral artery. Basilar artery diffusely diseased with but patent. Severe irregularity and multiple areas of stenosis in the posterior cerebral artery bilaterally.  Atherosclerotic irregularity in the cavernous carotid bilaterally without significant stenosis. Atherosclerotic irregularity throughout the anterior and middle cerebral arteries bilaterally compatible with moderately severe atherosclerotic disease. No large vessel occlusion.  Negative for cerebral aneurysm.  IMPRESSION: Acute infarct left pons  Chronic microvascular ischemic change  Lobular mass in the right parotid gland with an unusual appearance and similar that seen on prior CT. Recommend CT neck with contrast for further evaluation. Neoplasm and lymphoma in the differential.  Moderate to severe intracranial atherosclerotic disease. No large vessel occlusion.   Electronically Signed   By: Marlan Palau M.D.   On: 10/15/2014  11:55    Assessment/Plan: Diagnosis: left pontine infarct 1. Does the need for close, 24 hr/day medical supervision in concert with the patient's rehab needs make it unreasonable for this patient to be served in a less intensive setting? Yes 2. Co-Morbidities requiring supervision/potential complications: htn, ?right RTC injury 3. Due to bladder management, bowel management, safety, skin/wound care, disease management, medication administration, pain management and patient education, does the patient require 24 hr/day rehab nursing? Yes 4. Does the patient require coordinated care of a physician, rehab nurse, PT (1-2 hrs/day, 5 days/week), OT (1-2 hrs/day, 5 days/week) and SLP (1-2 hrs/day, 5 days/week) to address physical and functional deficits in the context of the above medical diagnosis(es)? Yes Addressing deficits in the following areas: balance,  endurance, locomotion, strength, transferring, bowel/bladder control, bathing, dressing, feeding, grooming, toileting, cognition, speech and psychosocial support 5. Can the patient actively participate in an intensive therapy program of at least 3 hrs of therapy per day at least 5 days per week? Yes 6. The potential for patient to make measurable gains while on inpatient rehab is excellent 7. Anticipated functional outcomes upon discharge from inpatient rehab are modified independent and supervision  with PT, modified independent and supervision with OT, modified independent and supervision with SLP. 8. Estimated rehab length of stay to reach the above functional goals is: 7-10 days 9. Does the patient have adequate social supports and living environment to accommodate these discharge functional goals? Yes 10. Anticipated D/C setting: Home 11. Anticipated post D/C treatments: HH therapy and Outpatient therapy 12. Overall Rehab/Functional Prognosis: good  RECOMMENDATIONS: This patient's condition is appropriate for continued rehabilitative care in the following setting: CIR Patient has agreed to participate in recommended program. Yes Note that insurance prior authorization may be required for reimbursement for recommended care.  Comment: Rehab Admissions Coordinator to follow up.  Thanks,  Ranelle OysterZachary T. Ani Deoliveira, MD, [place] DomFAAPMR     10/17/2014

## 2014-10-17 NOTE — Plan of Care (Signed)
Problem: Progression Outcomes Goal: Educational plan initiated Outcome: Completed/Met Date Met:  10/17/14

## 2014-10-17 NOTE — Progress Notes (Signed)
STROKE TEAM PROGRESS NOTE   HISTORY Teresa SchaumannFrances H Martinez is an 73 y.o. female with a history of hypertension and hyperlipidemia who acute onset of slurred speech and inability to move right extremities at 7:30 PM on 10/14/2014. She has no previous history of stroke nor TIA. She has not been on antiplatelet therapy. The scan of her head showed no acute intracranial abnormality. Deficits improved when EMS arrived. A recurrence of slurred speech in route to the emergency room resolved prior to arrival and did not recur. NIH stroke score was 0 at that point. At 2255 she developed recurrence of slurred speech and right facial weakness. Decision was made at that point to treat with IV TPA, which was administered. Patient's deficits resolved prior to starting TPA treatment. However, because of the recurrent nature of her symptoms TPA was chosen instead of heparin. As well, if patient had become symptomatic again and heparin started, TPA would've been contraindicated.   SUBJECTIVE (INTERVAL HISTORY):  Patient complains of right shoulder pain which was resident prior to stroke event.   OBJECTIVE Temp:  [97.9 F (36.6 C)-98.2 F (36.8 C)] 98 F (36.7 C) (11/30 0900) Pulse Rate:  [56-73] 73 (11/30 0900) Cardiac Rhythm:  [-]  Resp:  [17-20] 20 (11/30 0900) BP: (104-142)/(50-74) 119/50 mmHg (11/30 0900) SpO2:  [96 %-100 %] 100 % (11/30 0900)   Recent Labs Lab 10/14/14 2110  GLUCAP 123*    Recent Labs Lab 10/14/14 2045 10/14/14 2056 10/16/14 0204  NA 144 142 140  K 3.6* 3.4* 3.7  CL 104 107 107  CO2 22  --  21  GLUCOSE 184* 188* 121*  BUN 23 23 13   CREATININE 1.04 1.10 0.70  CALCIUM 9.0  --  8.2*    Recent Labs Lab 10/14/14 2045  AST 26  ALT 12  ALKPHOS 51  BILITOT <0.2*  PROT 6.7  ALBUMIN 3.9    Recent Labs Lab 10/14/14 2045 10/14/14 2056  WBC 7.3  --   NEUTROABS 3.1  --   HGB 12.4 13.6  HCT 38.1 40.0  MCV 80.2  --   PLT 193  --    No results for input(s):  CKTOTAL, CKMB, CKMBINDEX, TROPONINI in the last 168 hours.  Recent Labs  10/14/14 2045  LABPROT 13.4  INR 1.01    Recent Labs  10/15/14 0014  COLORURINE YELLOW  LABSPEC 1.020  PHURINE 6.5  GLUCOSEU NEGATIVE  HGBUR NEGATIVE  BILIRUBINUR NEGATIVE  KETONESUR NEGATIVE  PROTEINUR NEGATIVE  UROBILINOGEN 0.2  NITRITE NEGATIVE  LEUKOCYTESUR NEGATIVE       Component Value Date/Time   CHOL 215* 10/15/2014 0150   TRIG 158* 10/15/2014 0150   HDL 51 10/15/2014 0150   CHOLHDL 4.2 10/15/2014 0150   VLDL 32 10/15/2014 0150   LDLCALC 132* 10/15/2014 0150   Lab Results  Component Value Date   HGBA1C 6.0* 10/15/2014   No results found for: LABOPIA, COCAINSCRNUR, LABBENZ, AMPHETMU, THCU, LABBARB  No results for input(s): ETH in the last 168 hours.    CT of the head without contrast 10/15/2014 1. No evidence of intracranial hemorrhage or new acute abnormality following TPA administration. 2. Subtle hypoattenuation in the left hemi pons consistent with known acute infarct. Abnormality better demonstrated on recent MRI. Right thyroid nodules.  10/15/2014   ADDENDUM: The described old insular infarcts probably involve the external capsules rather than the insular cortex. There is also periventricular low density suggesting chronic small vessel disease.    10/15/2014   Negative for acute  hemorrhage. Probable old insults in the insular cortex.   CTA NECK 10/15/2014    Approximately 50% stenosis of the RIGHT internal carotid artery origin by NASCET criteria.  Luminal irregularity of the bilateral cervical internal carotid arteries may be seen with fibromuscular dysplasia.  Mild luminal regularity of the vertebral arteries is suggest atherosclerosis and, extrinsic compression due to cervical spine degenerative change.  Lobulated, irregularly enhancing RIGHT parotid gland, recommend correlation with physical examination and, consider MRI of the neck/parotid protocol.    CTA  HEAD 10/15/2014    No large vessel occlusion.  Severe calcific atherosclerosis of the carotid siphon. Intracranial luminal irregularity most in keeping with atherosclerosis.     MRI / MRA Brain without contrast media 10/15/2014 Acute infarct left pons Chronic microvascular ischemic change. Lobular mass in the right parotid gland with an unusual appearance and similar that seen on prior CT. Recommend CT neck with contrast for further evaluation. Neoplasm and lymphoma in the differential. Moderate to severe intracranial atherosclerotic disease. No large vessel occlusion.  2-D echocardiogram 10/15/2014 - Left ventricle: The cavity size was normal. Wall thickness wasnormal. Systolic function was normal. The estimated ejectionfraction was in the range of 55% to 60%. Wall motion was normal;there were no regional wall motion abnormalities. Features areconsistent with a pseudonormal left ventricular filling pattern,with concomitant abnormal relaxation and increased fillingpressure (grade 2 diastolic dysfunction). - Atrial septum: There was redundancy of the septum, withborderline criteria for aneurysm.  CT soft tissue neck with contrast 10/15/2014 The right parotid gland is diffusely abnormal with a lobular contour over and replacement of normal glandular tissue with lobular soft tissue density. No associated calcification in the gland. Atrophy of the deep lobe of the right parotid gland. No adenopathy. Remains salivary glands are normal. Differential diagnosis includes neoplasm such as lymphoma of the parotid gland. Primary carcinoma or metastatic disease to the gland considered less likely. Chronic inflammation of the gland is a consideration. Biopsy will be necessary for diagnosis. Right thyroid nodules.  CT cervical spine 10/16/2014 Right parotid lesion Right thyroid nodules Cervical spondylosis most severe at C5-6 with mild spinal stenosis and moderate foraminal encroachment bilaterally. No  cervical spine  CT abdomen and pelvis 10/16/2014 Status post cholecystectomy and hysterectomy. No acute abnormality seen in the abdomen or pelvis.  CT chest 10/16/2014 No significant abnormality seen in the chest.   PHYSICAL EXAM Gen: NAD Eyes: anicteric sclerae, moist conjunctivae                    CV: RRR, no MRG Mental Status: Alert, Not following commands Neuro:   awake alert oriented 2.  . Follows commands. Speech:    No aphasia, no dysarthria Cranial Nerves:    The pupils are equal, round, and reactive to light. EOMI. Visual fields are intact. Face symmetric. Eyes conjugate. Tomgue midline. Motor Observation:    no involuntary movements noted. Tone normal. Reflexes symmetric.  Strength:   Right deltoid 4+/5 otherwise strength is intact.    Sensation:     Intact to LT   Gait: Could not test   ASSESSMENT/PLAN Teresa Martinez is a 73 y.o. female with history of hypertension and hyperlipidemia presenting with slurred speech and right hemiplegia. She did receive IV t-PA 10/14/2014 at 2315.  Stroke:  Left pontine infarct status post IV TPA  Resultant - speech difficulties and right hemiplegia, resolving  MRI - left pontine infarct  CTA head and neck no large vessel stenosis. Right carotid and enlargement  Carotid  Doppler - Bilateral: 1-39% ICA stenosis. Vertebral artery flow is antegrade.  2D Echo - as above - no cardiac source of emboli identified. Atrial septum: There was redundancy of the septum, withborderline criteria for aneurysm.  HgbA1c 6.0  SCDs for VTE prophylaxis. Discontinued SCDs and started on Lovenox.  Diet Heart with thin liquids  No antithrombotics prior to admission, placed on Aspirin 325 mg daily.  Ongoing aggressive stroke risk factor management  Normal saline lock IV  Therapy recommendations:  Inpatient rehabilitation  Disposition:  Inpatient rehabilitation. Insurance approval underway  Hypertension  Home meds:   Lisinopril/hydrochlorothiazide  stable  Hyperlipidemia  Home meds:  No lipid lowering medications prior to admission. Allergy to Pravachol has been documented.  LDL 132, goal < 70  No statin due to history of statin intolerance  Other Stroke Risk Factors  Advanced age  Family hx stroke (mother)  Other Active Problems  Hypokalemia - resolved.  Fibromuscular dysplasia ?Marland Kitchen. There is a family history, daughter with FMD.   Right parotid gland lesion with thyroid nodules - CT of chest, abdomen, and pelvis - for  malignancy surveillance as mets is unlikely but still part of the differential.  Should follow up outpatient with ENT post rehabilitation stay  Right shoulder pain. Possible rotator cuff injury present prior to stroke. Will treat with Ultram 50 mg 2 tablets every 6 hours when necessary.  Other Pertinent History   CTA showed possible vertebral artery extrinsic compression due to cervical spine degenerative changes, will CT cervical spine  Hospital day # 3  BIBY,SHARON  10/17/2014 7:01 PM   I have personally examined this patient, reviewed notes, independently viewed imaging studies, participated in medical decision making and plan of care. I have made any additions or clarifications directly to the above note. Agree with note above. I had a long discussion with the patient's husband over the phone and the patient regarding her stroke, discussed the results of  Imaging studies and answered questions. Recommend inpatient rehabilitation and outpatient follow-up for her parotid mass  Delia HeadyPramod Sethi, MD Medical Director [facility] Pager: [phone] 10/17/2014 7:54 PM    To contact Stroke Continuity provider, please refer to [email or web address]. After hours, contact General Neurology

## 2014-10-17 NOTE — Evaluation (Signed)
Speech Language Pathology Evaluation Patient Details Name: Linus OrnFrances H Mintz-Whitcomb MRN: [id number] DOB: 08/18/1941 Today's Date: 10/17/2014 Time: [id number] SLP Time Calculation (min) (ACUTE ONLY): 14 min  Problem List:  Patient Active Problem List   Diagnosis Date Noted  . Left pontine stroke   . TIA (transient ischemic attack) 10/14/2014  . Hypokalemia 10/14/2014  . Overactive bladder 10/14/2014  . CVA (cerebral infarction) 10/14/2014  . URI 11/26/2010  . NECK PAIN, CHRONIC 10/29/2010  . Essential hypertension 10/10/2009  . INSOMNIA 09/15/2009  . Pure hypercholesterolemia 11/28/2008  . MENOPAUSAL SYNDROME 11/28/2008   Past Medical History:  Past Medical History  Diagnosis Date  . Hyperlipemia   . Statin intolerance   . Menopausal syndrome     [name]  . Hypertension   . Overactive bladder    Past Surgical History:  Past Surgical History  Procedure Laterality Date  . Cholecystectomy      1950  . Band hemorrhoidectomy      2010  . Parotidectomy      30 years ago  . Abdominal hysterectomy      1985   HPI:  73 y.o. female admitted with onset of slurred speech and inability to move R extremities at 7:30PM on 10/14/14. NIH= 0 Symptoms resolved in route with EMS. At 2255 she developed recurrence of slurred speech and right facial weakness. Decision was made at that point to treat with IV TPA, which was administered MRI (+) L Pons PMH: HTN, Hyperlipidemia   Assessment / Plan / Recommendation Clinical Impression  Pt has a mild dysarthria marked by low vocal intensity, hoarse vocal quality, and imprecise aritculation. Of note, pt reports that the changes in her voice began prior to acute CVA.  Pt was able to increase her overall intelligibility with Min cues from SLP for increased breath support, vocal intensity, and over articulation. Written handout provided. Pt will benefit from continued SLP services targeting dysarthria.    SLP Assessment  Patient needs  continued Speech Lanaguage Pathology Services    Follow Up Recommendations  Inpatient Rehab    Frequency and Duration min 2x/week  2 weeks   Pertinent Vitals/Pain Pain Assessment: No/denies pain   SLP Goals  Patient/Family Stated Goal: none stated Potential to Achieve Goals (ACUTE ONLY): Good  SLP Evaluation Prior Functioning  Cognitive/Linguistic Baseline: Within functional limits   Cognition  Overall Cognitive Status: Within Functional Limits for tasks assessed (at baseline per pt/husband) Orientation Level: Oriented X4    Comprehension  Auditory Comprehension Overall Auditory Comprehension: Appears within functional limits for tasks assessed Visual Recognition/Discrimination Discrimination: Within Function Limits Reading Comprehension Reading Status: Within funtional limits    Expression Expression Primary Mode of Expression: Verbal Verbal Expression Overall Verbal Expression: Appears within functional limits for tasks assessed Written Expression Written Expression: Not tested   Oral / Motor Oral Motor/Sensory Function Overall Oral Motor/Sensory Function: Impaired Labial ROM: Reduced right Labial Symmetry: Abnormal symmetry right Labial Strength: Reduced Lingual ROM: Within Functional Limits Lingual Symmetry: Within Functional Limits Lingual Strength: Within Functional Limits Facial ROM: Reduced right Facial Symmetry: Right droop Facial Strength: Reduced Velum: Within Functional Limits Mandible: Within Functional Limits Motor Speech Overall Motor Speech: Impaired Respiration: Impaired Level of Impairment: Conversation Phonation: Hoarse;Low vocal intensity Resonance: Within functional limits Articulation: Impaired Level of Impairment: Conversation Intelligibility: Intelligibility reduced Conversation: 75-100% accurate Effective Techniques: Increased vocal intensity;Over-articulate   GO      Maxcine HamLaura Paiewonsky, M.A. CCC-SLP [phone]  Maxcine Hamaiewonsky,  Twylah Bennetts 10/17/2014, 3:35 PM

## 2014-10-17 NOTE — Progress Notes (Signed)
Rehab Admissions Coordinator Note:  Patient was screened by Yana Schorr L for appropriateness for an Inpatient Acute Rehab Consult.  At this time, we are recommending Inpatient Rehab consult.  Teresa Martinez L 10/17/2014, 9:40 AM  I can be reached at [phone].

## 2014-10-17 NOTE — Progress Notes (Signed)
Physical Therapy Treatment Patient Details Name: Teresa Martinez MRN: [id number] DOB: 04/15/1941 Today's Date: 10/17/2014    History of Present Illness 73 y.o. female admitted with onset of slurred speech and inability to move R extremities at 7:30PM on 10/14/14. NIH= 0 Symptoms resolved in route with EMS. At 2255 she developed recurrence of slurred speech and right facial weakness. Decision was made at that point to treat with IV TPA, which was administered MRI (+) L Pons PMH: HTN, Hyperlipidemia    PT Comments    Pt highly motivated, however also with decr attention to tasks and requires re-direction. Also with inattention to RUE (loses grip on RW and unaware). Excellent rehab candidate.   Follow Up Recommendations  CIR     Equipment Recommendations  Other (comment) (TBA next venue)    Recommendations for Other Services Rehab consult     Precautions / Restrictions Precautions Precautions: Fall    Mobility  Bed Mobility               General bed mobility comments: not tested  Transfers Overall transfer level: Needs assistance Equipment used: 1 person hand held assist;Rolling walker (2 wheeled) Transfers: Sit to/from Stand Sit to Stand: Min assist;Mod assist         General transfer comment: stability assist;vc for safe use of RW  Ambulation/Gait Ambulation/Gait assistance: Mod assist Ambulation Distance (Feet): 120 Feet Assistive device: 1 person hand held assist;Rolling walker (2 wheeled) Gait Pattern/deviations: Step-through pattern;Decreased stride length;Decreased dorsiflexion - right;Drifts right/left Gait velocity: slow   General Gait Details: mild to moderate lean to Right worsening with fatigue. and with HHA; pt able to grip RW with Rt hand (although would lose her grip with decr attention and required cues); much more steady with RW, however impulsively turns with LOB   Stairs            Wheelchair Mobility    Modified Rankin  (Stroke Patients Only) Modified Rankin (Stroke Patients Only) Pre-Morbid Rankin Score: No symptoms Modified Rankin: Moderately severe disability     Balance     Sitting balance-Leahy Scale: Fair       Standing balance-Leahy Scale: Poor                      Cognition Arousal/Alertness: Awake/alert Behavior During Therapy: Flat affect;WFL for tasks assessed/performed Overall Cognitive Status: Within Functional Limits for tasks assessed                      Exercises Other Exercises Other Exercises: Standing heel raises x 10, toes raises x10 with bil UE support and min assist for balance Other Exercises: minisquats x 10 bil UE support Other Exercises: standing marching x10 each leg (alternating) bil UE support with min assist Other Exercises: sit to stand with min assist for balance and controlled descent x 7    General Comments General comments (skin integrity, edema, etc.): Husband present      Pertinent Vitals/Pain Pain Assessment: 0-10 Pain Score: 4  Pain Location: Rt shoulder Pain Descriptors / Indicators: Aching Pain Intervention(s): Limited activity within patient's tolerance;Repositioned    Home Living                      Prior Function            PT Goals (current goals can now be found in the care plan section) Acute Rehab PT Goals Patient Stated Goal: back to running PT Goal Formulation:  With patient Time For Goal Achievement: 10/16/14 Potential to Achieve Goals: Good    Frequency  Min 4X/week    PT Plan Current plan remains appropriate    Co-evaluation             End of Session Equipment Utilized During Treatment: Gait belt Activity Tolerance: Patient limited by fatigue Patient left: in chair;with call bell/phone within reach;with family/visitor present;with chair alarm set     Time: [id number] PT Time Calculation (min) (ACUTE ONLY): 26 min  Charges:  $Gait Training: 8-22 mins $Therapeutic Activity: 8-22  mins                    G Codes:      Christiaan Strebeck 10/17/2014, 4:03 PM Pager [phone]

## 2014-10-17 NOTE — Progress Notes (Signed)
Rehab admissions - I met with pt and her husband in follow up to rehab consult to explain the possibility of inpatient rehab. Further questions were answered and informational brochures were given. They are interested in pursuing inpatient rehab. Pt's husband had several good questions about insurance and pt's [employer] policy (not [employer]).   I explained that we will now open the case with [facility] Medicare to seek insurance authorization. We will consider possible inpatient rehab admit pending her medical clearance and completed medical work up, insurance authorization and our bed availability.  I will update the pt/family and medical team tomorrow.  Please call me with any questions. Thanks.  Nanetta Batty, PT Rehabilitation Admissions Coordinator [phone]

## 2014-10-18 DIAGNOSIS — I6309 Cerebral infarction due to thrombosis of other precerebral artery: Secondary | ICD-10-CM

## 2014-10-18 DIAGNOSIS — G459 Transient cerebral ischemic attack, unspecified: Secondary | ICD-10-CM

## 2014-10-18 MED ORDER — SODIUM CHLORIDE 0.9 % IV SOLN
INTRAVENOUS | Status: DC
  Administered 2014-10-18 – 2014-10-19 (×2): via INTRAVENOUS

## 2014-10-18 NOTE — Progress Notes (Addendum)
STROKE TEAM PROGRESS NOTE   HISTORY Teresa Martinez is an 73 y.o. female with a history of hypertension and hyperlipidemia who acute onset of slurred speech and inability to move right extremities at 7:30 PM on 10/14/2014. She has no previous history of stroke nor TIA. She has not been on antiplatelet therapy. The scan of her head showed no acute intracranial abnormality. Deficits improved when EMS arrived. A recurrence of slurred speech in route to the emergency room resolved prior to arrival and did not recur. NIH stroke score was 0 at that point. At 2255 she developed recurrence of slurred speech and right facial weakness. Decision was made at that point to treat with IV TPA, which was administered. Patient's deficits resolved prior to starting TPA treatment. However, because of the recurrent nature of her symptoms TPA was chosen instead of heparin. As well, if patient had become symptomatic again and heparin started, TPA would've been contraindicated.   SUBJECTIVE (INTERVAL HISTORY):  Patient complains of worsening right upper extremity strength today. Right lower extremity strength remains unchanged. She has been up out of bed with assistance to the bathroom OBJECTIVE Temp:  [97.6 F (36.4 C)-98.1 F (36.7 C)] 97.6 F (36.4 C) (12/01 1013) Pulse Rate:  [58-65] 65 (12/01 1013) Cardiac Rhythm:  [-]  Resp:  [16-18] 18 (12/01 1013) BP: (123-148)/(67-77) 148/67 mmHg (12/01 1013) SpO2:  [97 %-99 %] 98 % (12/01 1013)   Recent Labs Lab 10/14/14 2110  GLUCAP 123*    Recent Labs Lab 10/14/14 2045 10/14/14 2056 10/16/14 0204  NA 144 142 140  K 3.6* 3.4* 3.7  CL 104 107 107  CO2 22  --  21  GLUCOSE 184* 188* 121*  BUN 23 23 13   CREATININE 1.04 1.10 0.70  CALCIUM 9.0  --  8.2*    Recent Labs Lab 10/14/14 2045  AST 26  ALT 12  ALKPHOS 51  BILITOT <0.2*  PROT 6.7  ALBUMIN 3.9    Recent Labs Lab 10/14/14 2045 10/14/14 2056  WBC 7.3  --   NEUTROABS 3.1  --   HGB  12.4 13.6  HCT 38.1 40.0  MCV 80.2  --   PLT 193  --    No results for input(s): CKTOTAL, CKMB, CKMBINDEX, TROPONINI in the last 168 hours. No results for input(s): LABPROT, INR in the last 72 hours. No results for input(s): COLORURINE, LABSPEC, PHURINE, GLUCOSEU, HGBUR, BILIRUBINUR, KETONESUR, PROTEINUR, UROBILINOGEN, NITRITE, LEUKOCYTESUR in the last 72 hours.  Invalid input(s): APPERANCEUR     Component Value Date/Time   CHOL 215* 10/15/2014 0150   TRIG 158* 10/15/2014 0150   HDL 51 10/15/2014 0150   CHOLHDL 4.2 10/15/2014 0150   VLDL 32 10/15/2014 0150   LDLCALC 132* 10/15/2014 0150   Lab Results  Component Value Date   HGBA1C 6.0* 10/15/2014   No results found for: LABOPIA, COCAINSCRNUR, LABBENZ, AMPHETMU, THCU, LABBARB  No results for input(s): ETH in the last 168 hours.    CT of the head without contrast 10/15/2014 1. No evidence of intracranial hemorrhage or new acute abnormality following TPA administration. 2. Subtle hypoattenuation in the left hemi pons consistent with known acute infarct. Abnormality better demonstrated on recent MRI. Right thyroid nodules.  10/15/2014   ADDENDUM: The described old insular infarcts probably involve the external capsules rather than the insular cortex. There is also periventricular low density suggesting chronic small vessel disease.    10/15/2014   Negative for acute hemorrhage. Probable old insults in the insular cortex.  CTA NECK 10/15/2014    Approximately 50% stenosis of the RIGHT internal carotid artery origin by NASCET criteria.  Luminal irregularity of the bilateral cervical internal carotid arteries may be seen with fibromuscular dysplasia.  Mild luminal regularity of the vertebral arteries is suggest atherosclerosis and, extrinsic compression due to cervical spine degenerative change.  Lobulated, irregularly enhancing RIGHT parotid gland, recommend correlation with physical examination and, consider MRI of the  neck/parotid protocol.    CTA HEAD 10/15/2014    No large vessel occlusion.  Severe calcific atherosclerosis of the carotid siphon. Intracranial luminal irregularity most in keeping with atherosclerosis.     MRI / MRA Brain without contrast media 10/15/2014 Acute infarct left pons Chronic microvascular ischemic change. Lobular mass in the right parotid gland with an unusual appearance and similar that seen on prior CT. Recommend CT neck with contrast for further evaluation. Neoplasm and lymphoma in the differential. Moderate to severe intracranial atherosclerotic disease. No large vessel occlusion.  2-D echocardiogram 10/15/2014 - Left ventricle: The cavity size was normal. Wall thickness wasnormal. Systolic function was normal. The estimated ejectionfraction was in the range of 55% to 60%. Wall motion was normal;there were no regional wall motion abnormalities. Features areconsistent with a pseudonormal left ventricular filling pattern,with concomitant abnormal relaxation and increased fillingpressure (grade 2 diastolic dysfunction). - Atrial septum: There was redundancy of the septum, withborderline criteria for aneurysm.  CT soft tissue neck with contrast 10/15/2014 The right parotid gland is diffusely abnormal with a lobular contour over and replacement of normal glandular tissue with lobular soft tissue density. No associated calcification in the gland. Atrophy of the deep lobe of the right parotid gland. No adenopathy. Remains salivary glands are normal. Differential diagnosis includes neoplasm such as lymphoma of the parotid gland. Primary carcinoma or metastatic disease to the gland considered less likely. Chronic inflammation of the gland is a consideration. Biopsy will be necessary for diagnosis. Right thyroid nodules.  CT cervical spine 10/16/2014 Right parotid lesion Right thyroid nodules Cervical spondylosis most severe at C5-6 with mild spinal stenosis and moderate  foraminal encroachment bilaterally. No cervical spine  CT abdomen and pelvis 10/16/2014 Status post cholecystectomy and hysterectomy. No acute abnormality seen in the abdomen or pelvis.  CT chest 10/16/2014 No significant abnormality seen in the chest.   PHYSICAL EXAM Gen: NAD Eyes: anicteric sclerae, moist conjunctivae                    CV: RRR, no MRG Mental Status: Alert, Not following commands Neuro:   awake alert oriented 2.  . Follows commands. Speech:    No aphasia, no dysarthria Cranial Nerves:    The pupils are equal, round, and reactive to light. EOMI. Visual fields are intact. Face symmetric. Eyes conjugate. Tomgue midline. Motor Observation:    no involuntary movements noted. Tone normal. Reflexes symmetric.  Strength:   Right shoulder 3/5, Rt hip flexion 4/5 otherwise strength is intact.    Sensation:     Intact to LT   Gait: Could not test   ASSESSMENT/PLAN Ms. [name] is a 73 y.o. female with history of hypertension and hyperlipidemia presenting with slurred speech and right hemiplegia. She did receive IV t-PA 10/14/2014 at 2315.  Stroke:  Left pontine infarct status post IV TPA  Resultant - speech difficulties and right hemiplegia, resolving  MRI - left pontine infarct  CTA head and neck no large vessel stenosis. Right carotid and enlargement  Carotid Doppler - Bilateral: 1-39% ICA stenosis.  Vertebral artery flow is antegrade.  2D Echo - as above - no cardiac source of emboli identified. Atrial septum: There was redundancy of the septum, withborderline criteria for aneurysm.  HgbA1c 6.0  SCDs for VTE prophylaxis. Discontinued SCDs and started on Lovenox.  Diet Heart with thin liquids  No antithrombotics prior to admission, placed on Aspirin 325 mg daily.  Ongoing aggressive stroke risk factor management  Normal saline lock IV  Therapy recommendations:  Inpatient rehabilitation  Disposition:  Inpatient rehabilitation.  Insurance approval underway  Hypertension  Home meds:  Lisinopril/hydrochlorothiazide  stable  Hyperlipidemia  Home meds:  No lipid lowering medications prior to admission. Allergy to Pravachol has been documented.  LDL 132, goal < 70  No statin due to history of statin intolerance  Other Stroke Risk Factors  Advanced age  Family hx stroke (mother)  Other Active Problems  Hypokalemia - resolved.  Fibromuscular dysplasia ?Marland Kitchen. There is a family history, daughter with FMD.   Right parotid gland lesion with thyroid nodules - CT of chest, abdomen, and pelvis - for  malignancy surveillance as mets is unlikely but still part of the differential.  Should follow up outpatient with ENT post rehabilitation stay  Right shoulder pain. Possible rotator cuff injury present prior to stroke. Will treat with Ultram 50 mg 2 tablets every 6 hours when necessary.  Other Pertinent History   CTA showed possible vertebral artery extrinsic compression due to cervical spine degenerative changes,  But  CT cervical spine does not confirm this  Hospital day # 4  SETHI,PRAMOD  10/18/2014 1:56 PM   I have personally examined this patient, reviewed notes, independently viewed imaging studies, participated in medical decision making and plan of care. I have made any additions or clarifications directly to the above note. Agree with note above.  Await inpatient rehabilitation  Transfer. Will start iv fluids and minimise out of bed today due to increased weakness. D/w patient , husband and answered questions. Delia HeadyPramod Sethi, MD Medical Director [facility] Pager: [phone] 10/18/2014 1:56 PM    To contact Stroke Continuity provider, please refer to [email or web address]. After hours, contact General Neurology

## 2014-10-18 NOTE — Progress Notes (Signed)
Physical Therapy Treatment Patient Details Name: Linus OrnFrances H Mintz-Whitcomb MRN: [id number] DOB: 02/04/1941 Today's Date: 10/18/2014    History of Present Illness 73 y.o. female admitted with onset of slurred speech and inability to move R extremities at 7:30PM on 10/14/14. NIH= 0 Symptoms resolved in route with EMS. At 2255 she developed recurrence of slurred speech and right facial weakness. Decision was made at that point to treat with IV TPA, which was administered MRI (+) L Pons PMH: HTN, Hyperlipidemia    PT Comments    Pt in recliner on arrival. Reported her Rt arm was weaker today. Noted pt unable to grip with Rt hand, elbow flexion 3-, and Rt dorsiflexion 2+. (she was able to grip RW and ambulate 120 ft 10/17/14 pm). Pt reports her weakness started first thing this morning and she had not told anyone. Spoke with Clydie BraunKaren, RN re: above changes. RN contacting MD and pt assisted back to bed with HOB 0. Anticipate will need re-evaluation by PT (including goal update) on next visit.    Follow Up Recommendations  CIR     Equipment Recommendations  Other (comment) (TBA next venue)    Recommendations for Other Services       Precautions / Restrictions Precautions Precautions: Fall    Mobility  Bed Mobility Overal bed mobility: Needs Assistance Bed Mobility: Sit to Supine       Sit to supine: Min guard   General bed mobility comments: Pt left her RLE hanging off the EOB and required cues to be aware and then lifted her leg up  Transfers Overall transfer level: Needs assistance Equipment used: None Transfers: Sit to/from Stand Sit to Stand: Min assist         General transfer comment: impulsively coming to stand with footrest of recliner still elevated  Ambulation/Gait Ambulation/Gait assistance: Mod assist Ambulation Distance (Feet): 2 Feet Assistive device: None Gait Pattern/deviations: Step-to pattern;Decreased stance time - right;Decreased step length -  right;Decreased dorsiflexion - right;Decreased weight shift to right;Shuffle     General Gait Details: incr Rt lean with decr ability to maintain knee extension when stepping; pt with decr awareness of current limitations (which are worse today)   Stairs            Wheelchair Mobility    Modified Rankin (Stroke Patients Only) Modified Rankin (Stroke Patients Only) Pre-Morbid Rankin Score: No symptoms Modified Rankin: Moderately severe disability     Balance                                    Cognition Arousal/Alertness: Awake/alert Behavior During Therapy: Flat affect;Impulsive Overall Cognitive Status: Impaired/Different from baseline Area of Impairment: Attention;Safety/judgement;Awareness   Current Attention Level: Sustained     Safety/Judgement: Decreased awareness of safety Awareness: Intellectual   General Comments: pt appeared     Exercises      General Comments General comments (skin integrity, edema, etc.): RN called and made aware of pt's decr Rt sided strength. She reported she would call the MD. PT assisted her back to bed with HOB 0.      Pertinent Vitals/Pain Pain Assessment: 0-10 Pain Score: 2  Pain Location: Rt shoulder Pain Intervention(s): Monitored during session;Repositioned    Home Living                      Prior Function  PT Goals (current goals can now be found in the care plan section) Progress towards PT goals: Not progressing toward goals - comment (?extension of CVA with incr weakness)    Frequency  Min 4X/week    PT Plan Current plan remains appropriate    Co-evaluation             End of Session Equipment Utilized During Treatment: Gait belt Activity Tolerance: Treatment limited secondary to medical complications (Comment) Patient left: in bed;with call bell/phone within reach;with bed alarm set     Time: [id number] PT Time Calculation (min) (ACUTE ONLY): 12  min  Charges:  $Therapeutic Activity: 8-22 mins                    G Codes:      Michaelina Blandino 10/18/2014, 11:03 AM Pager [phone]

## 2014-10-18 NOTE — Plan of Care (Signed)
Problem: Discharge/Transitional Outcomes Goal: If vent dependent, trach in place Outcome: Not Applicable Date Met:  54/49/20

## 2014-10-18 NOTE — Progress Notes (Signed)
Rehab admissions - I am following pt's case and have not received a response from [facility] re: insurance authorization. I met with pt and her husband this afternoon to share that we are still waiting to hear back from insurance.  We will consider possible inpatient rehab admit pending pt's medical clearance, insurance authorization and our bed availability.   I will check on pt's status tomorrow and keep the pt/family and medical team aware of any updates.   Please call me with any questions. Thanks.  Nanetta Batty, PT Rehabilitation Admissions Coordinator [phone]

## 2014-10-18 NOTE — Progress Notes (Signed)
Rehab admissions - I met with pt to share that we are now waiting for a response from [facility] for possible authorization for inpatient rehab. Pt stated she is not doing well today and is weaker. I noted PT's comments from today's session: "Reported her Rt arm was weaker today. Noted pt unable to grip with Rt hand, elbow flexion 3-, and Rt dorsiflexion 2+. (she was able to grip RW and ambulate 120 ft 10/17/14 pm)."  Per notes, RN and Dr. Leonie Man are aware.   We will consider possible inpatient rehab admit pending pt's medical stability, insurance authorization and our bed availability.  I will continue to follow pt's case. Please call me with any questions. Thanks.  Nanetta Batty, PT Rehabilitation Admissions Coordinator [phone]

## 2014-10-18 NOTE — Progress Notes (Signed)
Patient significantly weaker in R arm and leg. Reported verbally to Dr. Pearlean BrownieSethi during his rounds.

## 2014-10-18 NOTE — Progress Notes (Signed)
Medicare Important Message given? YES  (If response is "NO", the following Medicare IM given date fields will be blank)  Date Medicare IM given:  10/18/2014 Medicare IM given by: Daleen BoSHAVIS, Jere Bostrom Kerrick Miler RN CCM Case Mgmt phone [phone]

## 2014-10-19 ENCOUNTER — Encounter (HOSPITAL_COMMUNITY): Payer: Self-pay | Admitting: Physical Medicine and Rehabilitation

## 2014-10-19 ENCOUNTER — Encounter (HOSPITAL_COMMUNITY): Payer: Self-pay | Admitting: *Deleted

## 2014-10-19 ENCOUNTER — Inpatient Hospital Stay (HOSPITAL_COMMUNITY): Payer: Medicare PPO

## 2014-10-19 ENCOUNTER — Inpatient Hospital Stay (HOSPITAL_COMMUNITY)
Admission: RE | Admit: 2014-10-19 | Discharge: 2014-11-01 | DRG: 056 | Disposition: A | Discharge status: Home Health | Payer: Medicare PPO | Source: Intra-hospital | Attending: Physical Medicine & Rehabilitation | Admitting: Physical Medicine & Rehabilitation

## 2014-10-19 DIAGNOSIS — G8191 Hemiplegia, unspecified affecting right dominant side: Principal | ICD-10-CM | POA: Diagnosis present

## 2014-10-19 DIAGNOSIS — G8929 Other chronic pain: Secondary | ICD-10-CM | POA: Diagnosis present

## 2014-10-19 DIAGNOSIS — R471 Dysarthria and anarthria: Secondary | ICD-10-CM | POA: Diagnosis present

## 2014-10-19 DIAGNOSIS — I639 Cerebral infarction, unspecified: Secondary | ICD-10-CM | POA: Diagnosis present

## 2014-10-19 DIAGNOSIS — F4322 Adjustment disorder with anxiety: Secondary | ICD-10-CM | POA: Diagnosis present

## 2014-10-19 DIAGNOSIS — I10 Essential (primary) hypertension: Secondary | ICD-10-CM | POA: Diagnosis present

## 2014-10-19 DIAGNOSIS — M779 Enthesopathy, unspecified: Secondary | ICD-10-CM | POA: Diagnosis present

## 2014-10-19 DIAGNOSIS — M25511 Pain in right shoulder: Secondary | ICD-10-CM | POA: Diagnosis present

## 2014-10-19 DIAGNOSIS — E041 Nontoxic single thyroid nodule: Secondary | ICD-10-CM | POA: Diagnosis present

## 2014-10-19 DIAGNOSIS — M4802 Spinal stenosis, cervical region: Secondary | ICD-10-CM | POA: Diagnosis present

## 2014-10-19 DIAGNOSIS — N319 Neuromuscular dysfunction of bladder, unspecified: Secondary | ICD-10-CM | POA: Diagnosis present

## 2014-10-19 DIAGNOSIS — I6350 Cerebral infarction due to unspecified occlusion or stenosis of unspecified cerebral artery: Secondary | ICD-10-CM

## 2014-10-19 DIAGNOSIS — N318 Other neuromuscular dysfunction of bladder: Secondary | ICD-10-CM

## 2014-10-19 DIAGNOSIS — E785 Hyperlipidemia, unspecified: Secondary | ICD-10-CM | POA: Diagnosis present

## 2014-10-19 DIAGNOSIS — F411 Generalized anxiety disorder: Secondary | ICD-10-CM | POA: Diagnosis present

## 2014-10-19 DIAGNOSIS — M7581 Other shoulder lesions, right shoulder: Secondary | ICD-10-CM | POA: Diagnosis present

## 2014-10-19 DIAGNOSIS — R52 Pain, unspecified: Secondary | ICD-10-CM

## 2014-10-19 DIAGNOSIS — I63312 Cerebral infarction due to thrombosis of left middle cerebral artery: Secondary | ICD-10-CM | POA: Insufficient documentation

## 2014-10-19 MED ORDER — ENOXAPARIN SODIUM 40 MG/0.4ML ~~LOC~~ SOLN
40.0000 mg | SUBCUTANEOUS | Status: DC
  Administered 2014-10-20 – 2014-11-01 (×13): 40 mg via SUBCUTANEOUS
  Filled 2014-10-19 (×14): qty 0.4

## 2014-10-19 MED ORDER — ACETAMINOPHEN 325 MG PO TABS: 325.0000 mg | ORAL_TABLET | ORAL | Status: DC | PRN

## 2014-10-19 MED ORDER — FLEET ENEMA 7-19 GM/118ML RE ENEM: 1.0000 | ENEMA | Freq: Once | RECTAL | Status: AC | PRN

## 2014-10-19 MED ORDER — GUAIFENESIN-DM 100-10 MG/5ML PO SYRP: 5.0000 mL | ORAL_SOLUTION | Freq: Four times a day (QID) | ORAL | Status: DC | PRN

## 2014-10-19 MED ORDER — PROCHLORPERAZINE MALEATE 5 MG PO TABS
5.0000 mg | ORAL_TABLET | Freq: Four times a day (QID) | ORAL | Status: DC | PRN
Start: 2014-10-19 — End: 2014-11-01
  Filled 2014-10-19: qty 2

## 2014-10-19 MED ORDER — ASPIRIN EC 325 MG PO TBEC
325.0000 mg | DELAYED_RELEASE_TABLET | Freq: Every day | ORAL | Status: DC
  Administered 2014-10-20 – 2014-11-01 (×13): 325 mg via ORAL
  Filled 2014-10-19 (×14): qty 1

## 2014-10-19 MED ORDER — PROCHLORPERAZINE 25 MG RE SUPP: 12.5000 mg | Freq: Four times a day (QID) | RECTAL | Status: DC | PRN

## 2014-10-19 MED ORDER — PANTOPRAZOLE SODIUM 40 MG PO TBEC
40.0000 mg | DELAYED_RELEASE_TABLET | Freq: Every day | ORAL | Status: DC
  Administered 2014-10-19 – 2014-10-21 (×3): 40 mg via ORAL
  Filled 2014-10-19 (×3): qty 1

## 2014-10-19 MED ORDER — OXYCODONE HCL 5 MG PO TABS
5.0000 mg | ORAL_TABLET | ORAL | Status: DC | PRN
  Administered 2014-10-19 – 2014-10-24 (×10): 5 mg via ORAL
  Filled 2014-10-19 (×12): qty 1

## 2014-10-19 MED ORDER — SENNOSIDES-DOCUSATE SODIUM 8.6-50 MG PO TABS: 1.0000 | ORAL_TABLET | Freq: Every evening | ORAL | Status: DC | PRN

## 2014-10-19 MED ORDER — ALUM & MAG HYDROXIDE-SIMETH 200-200-20 MG/5ML PO SUSP: 30.0000 mL | ORAL | Status: DC | PRN

## 2014-10-19 MED ORDER — TRAZODONE HCL 50 MG PO TABS: 25.0000 mg | ORAL_TABLET | Freq: Every evening | ORAL | Status: DC | PRN

## 2014-10-19 MED ORDER — TRAMADOL HCL 50 MG PO TABS: 100.0000 mg | ORAL_TABLET | Freq: Three times a day (TID) | ORAL | Status: DC | PRN

## 2014-10-19 MED ORDER — PROCHLORPERAZINE EDISYLATE 5 MG/ML IJ SOLN
5.0000 mg | Freq: Four times a day (QID) | INTRAMUSCULAR | Status: DC | PRN
Start: 2014-10-19 — End: 2014-11-01

## 2014-10-19 MED ORDER — BISACODYL 10 MG RE SUPP: 10.0000 mg | Freq: Every day | RECTAL | Status: DC | PRN

## 2014-10-19 NOTE — Progress Notes (Signed)
STROKE TEAM PROGRESS NOTE   HISTORY Teresa SchaumannFrances H Martinez is an 73 y.o. female with a history of hypertension and hyperlipidemia who acute onset of slurred speech and inability to move right extremities at 7:30 PM on 10/14/2014. She has no previous history of stroke nor TIA. She has not been on antiplatelet therapy. The scan of her head showed no acute intracranial abnormality. Deficits improved when EMS arrived. A recurrence of slurred speech in route to the emergency room resolved prior to arrival and did not recur. NIH stroke score was 0 at that point. At 2255 she developed recurrence of slurred speech and right facial weakness. Decision was made at that point to treat with IV TPA, which was administered. Patient's deficits resolved prior to starting TPA treatment. However, because of the recurrent nature of her symptoms TPA was chosen instead of heparin. As well, if patient had become symptomatic again and heparin started, TPA would've been contraindicated.   SUBJECTIVE (INTERVAL HISTORY):  Patient has stable right upper extremity strength today. Right lower extremity strength remains unchanged. She has been up out of bed with assistance to the bathroom. Husband is at bedside and has questions about rehab OBJECTIVE Temp:  [98 F (36.7 C)-98.9 F (37.2 C)] 98.4 F (36.9 C) (12/02 1017) Pulse Rate:  [60-64] 61 (12/02 1017) Cardiac Rhythm:  [-]  Resp:  [18-20] 18 (12/02 1017) BP: (137-147)/(62-75) 137/74 mmHg (12/02 1017) SpO2:  [97 %-100 %] 98 % (12/02 1017)   Recent Labs Lab 10/14/14 2110  GLUCAP 123*    Recent Labs Lab 10/14/14 2045 10/14/14 2056 10/16/14 0204  NA 144 142 140  K 3.6* 3.4* 3.7  CL 104 107 107  CO2 22  --  21  GLUCOSE 184* 188* 121*  BUN 23 23 13   CREATININE 1.04 1.10 0.70  CALCIUM 9.0  --  8.2*    Recent Labs Lab 10/14/14 2045  AST 26  ALT 12  ALKPHOS 51  BILITOT <0.2*  PROT 6.7  ALBUMIN 3.9    Recent Labs Lab 10/14/14 2045 10/14/14 2056   WBC 7.3  --   NEUTROABS 3.1  --   HGB 12.4 13.6  HCT 38.1 40.0  MCV 80.2  --   PLT 193  --    No results for input(s): CKTOTAL, CKMB, CKMBINDEX, TROPONINI in the last 168 hours. No results for input(s): LABPROT, INR in the last 72 hours. No results for input(s): COLORURINE, LABSPEC, PHURINE, GLUCOSEU, HGBUR, BILIRUBINUR, KETONESUR, PROTEINUR, UROBILINOGEN, NITRITE, LEUKOCYTESUR in the last 72 hours.  Invalid input(s): APPERANCEUR     Component Value Date/Time   CHOL 215* 10/15/2014 0150   TRIG 158* 10/15/2014 0150   HDL 51 10/15/2014 0150   CHOLHDL 4.2 10/15/2014 0150   VLDL 32 10/15/2014 0150   LDLCALC 132* 10/15/2014 0150   Lab Results  Component Value Date   HGBA1C 6.0* 10/15/2014   No results found for: LABOPIA, COCAINSCRNUR, LABBENZ, AMPHETMU, THCU, LABBARB  No results for input(s): ETH in the last 168 hours.    CT of the head without contrast 10/15/2014 1. No evidence of intracranial hemorrhage or new acute abnormality following TPA administration. 2. Subtle hypoattenuation in the left hemi pons consistent with known acute infarct. Abnormality better demonstrated on recent MRI. Right thyroid nodules.  10/15/2014   ADDENDUM: The described old insular infarcts probably involve the external capsules rather than the insular cortex. There is also periventricular low density suggesting chronic small vessel disease.    10/15/2014   Negative for acute  hemorrhage. Probable old insults in the insular cortex.   CTA NECK 10/15/2014    Approximately 50% stenosis of the RIGHT internal carotid artery origin by NASCET criteria.  Luminal irregularity of the bilateral cervical internal carotid arteries may be seen with fibromuscular dysplasia.  Mild luminal regularity of the vertebral arteries is suggest atherosclerosis and, extrinsic compression due to cervical spine degenerative change.  Lobulated, irregularly enhancing RIGHT parotid gland, recommend correlation with physical  examination and, consider MRI of the neck/parotid protocol.    CTA HEAD 10/15/2014    No large vessel occlusion.  Severe calcific atherosclerosis of the carotid siphon. Intracranial luminal irregularity most in keeping with atherosclerosis.     MRI / MRA Brain without contrast media 10/15/2014 Acute infarct left pons Chronic microvascular ischemic change. Lobular mass in the right parotid gland with an unusual appearance and similar that seen on prior CT. Recommend CT neck with contrast for further evaluation. Neoplasm and lymphoma in the differential. Moderate to severe intracranial atherosclerotic disease. No large vessel occlusion.  2-D echocardiogram 10/15/2014 - Left ventricle: The cavity size was normal. Wall thickness wasnormal. Systolic function was normal. The estimated ejectionfraction was in the range of 55% to 60%. Wall motion was normal;there were no regional wall motion abnormalities. Features areconsistent with a pseudonormal left ventricular filling pattern,with concomitant abnormal relaxation and increased fillingpressure (grade 2 diastolic dysfunction). - Atrial septum: There was redundancy of the septum, withborderline criteria for aneurysm.  CT soft tissue neck with contrast 10/15/2014 The right parotid gland is diffusely abnormal with a lobular contour over and replacement of normal glandular tissue with lobular soft tissue density. No associated calcification in the gland. Atrophy of the deep lobe of the right parotid gland. No adenopathy. Remains salivary glands are normal. Differential diagnosis includes neoplasm such as lymphoma of the parotid gland. Primary carcinoma or metastatic disease to the gland considered less likely. Chronic inflammation of the gland is a consideration. Biopsy will be necessary for diagnosis. Right thyroid nodules.  CT cervical spine 10/16/2014 Right parotid lesion Right thyroid nodules Cervical spondylosis most severe at C5-6 with  mild spinal stenosis and moderate foraminal encroachment bilaterally. No cervical spine  CT abdomen and pelvis 10/16/2014 Status post cholecystectomy and hysterectomy. No acute abnormality seen in the abdomen or pelvis.  CT chest 10/16/2014 No significant abnormality seen in the chest.   PHYSICAL EXAM Gen: NAD Eyes: anicteric sclerae, moist conjunctivae                    CV: RRR, no MRG Mental Status: Alert, Not following commands Neuro:   awake alert oriented 2.  . Follows commands. Speech:    No aphasia, no dysarthria Cranial Nerves:    The pupils are equal, round, and reactive to light. EOMI. Visual fields are intact. Face symmetric. Eyes conjugate. Tomgue midline. Motor Observation:    no involuntary movements noted. Tone normal. Reflexes symmetric.  Strength:   Right shoulder 3/5, Rt hip flexion 4/5 otherwise strength is intact.    Sensation:     Intact to LT   Gait: Could not test   ASSESSMENT/PLAN Ms. Teresa SchaumannFrances H Martinez is a 73 y.o. female with history of hypertension and hyperlipidemia presenting with slurred speech and right hemiplegia. She did receive IV t-PA 10/14/2014 at 2315.  Stroke:  Left pontine infarct status post IV TPA  Resultant - speech difficulties and right hemiplegia, resolving  MRI - left pontine infarct  CTA head and neck no large vessel stenosis. Right carotid  and enlargement  Carotid Doppler - Bilateral: 1-39% ICA stenosis. Vertebral artery flow is antegrade.  2D Echo - as above - no cardiac source of emboli identified. Atrial septum: There was redundancy of the septum, withborderline criteria for aneurysm.  HgbA1c 6.0  SCDs for VTE prophylaxis. Discontinued SCDs and started on Lovenox.  Diet Heart with thin liquids  No antithrombotics prior to admission, placed on Aspirin 325 mg daily.  Ongoing aggressive stroke risk factor management  Normal saline lock IV  Therapy recommendations:  Inpatient  rehabilitation  Disposition:  Inpatient rehabilitation. Insurance approval underway  Hypertension  Home meds:  Lisinopril/hydrochlorothiazide  stable  Hyperlipidemia  Home meds:  No lipid lowering medications prior to admission. Allergy to Pravachol has been documented.  LDL 132, goal < 70  No statin due to history of statin intolerance  Other Stroke Risk Factors  Advanced age  Family hx stroke (mother)  Other Active Problems  Hypokalemia - resolved.  Fibromuscular dysplasia ?Marland Kitchen There is a family history, daughter with FMD.   Right parotid gland lesion with thyroid nodules - CT of chest, abdomen, and pelvis - for  malignancy surveillance as mets is unlikely but still part of the differential.  Should follow up outpatient with ENT post rehabilitation stay  Right shoulder pain. Possible rotator cuff injury present prior to stroke. Will treat with Ultram 50 mg 2 tablets every 6 hours when necessary.  Other Pertinent History   CTA showed possible vertebral artery extrinsic compression due to cervical spine degenerative changes,  But  CT cervical spine does not confirm this  Hospital day # 5  Saim Almanza  10/19/2014 2:51 PM   I have personally examined this patient, reviewed notes, independently viewed imaging studies, participated in medical decision making and plan of care. I have made any additions or clarifications directly to the above note. Agree with note above.  Await inpatient rehabilitation  Transfer.   D/w patient , husband and answered questions. Delia Heady, MD Medical Director [facility] Pager: [phone] 10/19/2014 2:51 PM    To contact Stroke Continuity provider, please refer to [email or web address]. After hours, contact General Neurology

## 2014-10-19 NOTE — Discharge Summary (Addendum)
Physician Discharge Summary  Patient ID: Teresa Martinez MRN: [id number] DOB/AGE: 75/02/1941 75 y.o.  Admit date: 10/14/2014 Discharge date: 10/19/2014  Admission Diagnoses: Slurred speech and right sided weakness  Discharge Diagnoses: Left pontine infarct likely due to intracranial atherosclerosis treated with IV tPA Principal Problem:   TIA (transient ischemic attack) Active Problems:   Pure hypercholesterolemia   Essential hypertension   Hypokalemia   Overactive bladder   CVA (cerebral infarction)   Left pontine stroke   Stenosis of cervical spine region   Chronic right shoulder pain   Cerebral infarction due to thrombosis of left middle cerebral artery History of resected right parotid mass- Ct scan shows lobulated irregularly enhancing right parotid gland questionable recurrent lesion for which  She will need outpatient follow-up and biopsy of the right parotid lesion  Discharged Condition: fair  Hospital Course: Teresa Martinez is an 73 y.o. female with a history of hypertension and hyperlipidemia who acute onset of slurred speech and inability to move right extremities at 7:30 PM on 10/14/2014. She has no previous history of stroke nor TIA. She has not been on antiplatelet therapy. The scan of her head showed no acute intracranial abnormality. Deficits improved when EMS arrived. A recurrence of slurred speech in route to the emergency room resolved prior to arrival and did not recur. NIH stroke score was 0 at that point. At 2255 she developed recurrence of slurred speech and right facial weakness. Decision was made at that point to treat with IV TPA, which was administered. Patient's deficits resolved prior to starting TPA treatment. However, because of the recurrent nature of her symptoms TPA was chosen instead of heparin. As well, if patient had become symptomatic again and heparin started, TPA would've been contraindicated. She was admitted to the intensive care  unit and blood pressure was tightly controlled. Her speech improved but she had some persistent mild right facial weakness as well as right hemiparesis mainly involving the right upper extremity. Her neurological symptoms do fluctuate slightly after admission. CT angiogram showed 50% stenosis of the right ICA and mild luminal irregularity of bilateral cervical internal carotid arteries raising question of fibromuscular dysplasia. There appeared to be extrinsic compression due to degenerative cervical spine disease. There is also a lobulated irregularly enhancing right parotid gland which raises some concern of malignancy however CT soft tissue neck with contrast showed diffusely abnormal parotid gland with global or contour over and replacement of normal glandular tissue with soft tissue density without any associated calcification. Differential diagnoses included chronic inflammation versus lymphoma and primary carcinoma was felt to be less likely. A biopsy was recommended. CT scan of the chest, abdomen and pelvis did not reveal any malignancy. Patient did have a prior history of parotid surgery and she was advised to follow-up with a surgeon for further outpatient follow-up for the parotid lesion. CT angiogram of the brain showed no large vessel occlusion. MRI scan of the brain showed acute infarct involving the left pons and chronic microvascular ischemic changes. Transthoracic echo showed normal ejection fraction. She also complained of worsening of her chronic right shoulder pain during the hospitalization for which he was treated with analgesics and advise further evaluation as per the rehabilitation M.D. She was seen in consultation by physical therapy, occupational therapy, speech therapy and rehabilitation medicine and found to be a good patient for inpatient rehabilitation where she was transferred after insurance approval on 10/19/14. On the day of discharge her neurological exam showed that she was awake  alert  oriented. She had mild right facial weakness. She had 3/5 right upper extremity distant and 4/5 right lower extremity strength. She was able to ambulate with some assistance.  Consults: rehabilitation medicine  Significant Diagnostic Studies: see above Treatments: see above Discharge Exam: Blood pressure 137/74, pulse 61, temperature 98.4 F (36.9 C), temperature source Oral, resp. rate 18, height 5\' 6"  (1.676 m), weight 139 lb 5.3 oz (63.2 kg), SpO2 98 %. Neuro:  awake alert oriented 2. . Follows commands. Speech:  No aphasia, no dysarthria Cranial Nerves:  The pupils are equal, round, and reactive to light. EOMI. Visual fields are intact. Face asymmetric with mild right lower face weakness.. Eyes conjugate. Tomgue midline. Motor Observation:  no involuntary movements noted. Tone normal. Reflexes symmetric.  Strength:  Right shoulder 3/5, Rt hip flexion 4/5 otherwise strength is intact.  Sensation:  Intact to LT  Gait: Could not testDisposition: CLR  Discharge Instructions    Ambulatory referral to Neurology    Complete by:  As directed   Stroke patient. Dr. Pearlean BrownieSethi prefers follow up in 1 month            Medication List    TAKE these medications        ALEVE 220 MG tablet  Generic drug:  naproxen sodium  Take 440 mg by mouth daily as needed (pain/headaches).     b complex vitamins tablet  Take 1 tablet by mouth daily.     CALCIUM+D3 600-800 MG-UNIT Tabs  Generic drug:  Calcium Carb-Cholecalciferol  Take 1 tablet by mouth daily.     estradiol 0.1 MG/GM vaginal cream  Commonly known as:  ESTRACE  Place 1 Applicatorful vaginally once a week.     fesoterodine 4 MG Tb24 tablet  Commonly known as:  TOVIAZ  Take 1 tablet (4 mg total) by mouth daily.     Fish Oil 1000 MG Caps  Take 1,000 mg by mouth daily.     lisinopril-hydrochlorothiazide 20-12.5 MG per tablet  Commonly known as:  PRINZIDE,ZESTORETIC  Take 1 tablet by mouth daily.      loratadine 10 MG tablet  Commonly known as:  CLARITIN  Take 10 mg by mouth daily.     multivitamin with minerals Tabs tablet  Take 1 tablet by mouth daily.     pravastatin 20 MG tablet  Commonly known as:  PRAVACHOL  Take 1 tablet (20 mg total) by mouth daily.     VITAMIN D PO  Take 1 tablet by mouth daily. Aspirin 325 mg daily           Follow-up Information    Follow up with SETHI,PRAMOD, MD In 1 month.   Specialties:  Neurology, Radiology   Why:  Stroke Clinic, Office will call you with appointment date & time   Contact information:   [address] [place] [address] [phone]       Follow up with Rogelia BogaKWIATKOWSKI,PETER FRANK, MD. Schedule an appointment as soon as possible for a visit in 2 weeks.   Specialty:  Internal Medicine   Why:  for hospital follow up   Contact information:   [address] [facility] [address] [place] [address] [phone]     Outpatient biopsy of right parotid lesion to be arranged by primary MD Chronic right shoulder pain to be evaluated by rehabilitation M.D. Time spent on discharge summary : 32 minutes Signed: SETHI,PRAMOD 10/19/2014, 3:10 PM

## 2014-10-19 NOTE — Progress Notes (Signed)
CARE MANAGEMENT NOTE 10/19/2014  Patient:  Teresa Martinez,Teresa Martinez   Account Number:  [id number]  Date Initiated:  10/18/2014  Documentation initiated by:  [facility]Kam[facility] Buren  Subjective/Objective Assessment:   left pontine infarct     Action/Plan:   lives at home with husband   Anticipated DC Date:  10/19/2014   Anticipated DC Plan:  IP REHAB FACILITY      DC Planning Services  CM consult      Choice offered to / List presented to:             Status of service:  In process, will continue to follow Medicare Important Message given?  YES (If response is "NO", the following Medicare IM given date fields will be blank) Date Medicare IM given:  10/18/2014 Medicare IM given by:  [place] [facility]Tamiyah [place] Date Additional Medicare IM given:   Additional Medicare IM given by:    Discharge Disposition:  IP REHAB FACILITY  Per UR Regulation:  Reviewed for med. necessity/level of care/duration of stay  If discussed at Long Length of Stay Meetings, dates discussed:    Comments:  10/18/2014 1100 Waiting CIR approval. Isidoro DonningAlesia Deriona Altemose RN CCM Case Mgmt phone [phone]

## 2014-10-19 NOTE — H&P (Signed)
Expand All Collapse All      Physical Medicine and Rehabilitation Admission H&P   Chief Complaint  Patient presents with  . Right sided weakness and slurred speech   HPI: Teresa Martinez is a 73 y.o. RH-female with history of HTN, hyperlipidemia who was admitted on 10/14/14 with acute onset of slurred speech and right sided weakness. Patient with sputtering symptoms and was treated with tPA due to recurrent nature of symptoms. CTA head/neck with 50% stenosis of R-ICA with luminal irregularity of bilateral ICA question fibromuscular dysplasia, severe calcific atherosclerosis of the carotid siphon and mild luminal irregularity of VA with extrinsic compression due to cervical spine degenerative changes. MRI/MRA brain done revealing acute left pontine infarct with lobular mass right parotid gland. CT cervical spine with cervical spondylosis C5/C6. CT Neck with right parotid mass and two right thyroid nodules without adenopathy. CT abdomen/pelvis without significant abnormality. 2D echo with EF 55-60& with no wall abnormality but grade 2 diastolic dysfunction and redundancy of atrial septum with borderline criteria for aneurysm. Patient was started on ASA for secondary stroke prevention. She had worsening of symptoms on 12/1 and was treated with bedrest as well as IVF. She continues to have significant pain RUE as well as difficulty with RLE control. Patient with resultant right hemiparesis, balance deficits, dysarthria. CIR was recommended by MD and rehab team and patient admitted today.    Review of Systems  HENT: Negative for hearing loss.  Respiratory: Negative for cough and sputum production.  Cardiovascular: Negative for chest pain and palpitations.  Gastrointestinal: Negative for heartburn, nausea and constipation.  Musculoskeletal: Positive for myalgias and joint pain.  Neurological: Positive for sensory change, speech change and focal weakness. Negative for dizziness,  tingling and headaches.  Psychiatric/Behavioral: The patient is nervous/anxious and has insomnia.      Past Medical History  Diagnosis Date  . Hyperlipemia   . Statin intolerance   . Menopausal syndrome     [name](Sherry La VerniaDickstein)  . Hypertension   . Overactive bladder   . Cervical spine degeneration     C5/C6  . Chronic right shoulder pain     Past Surgical History  Procedure Laterality Date  . Cholecystectomy      1950  . Band hemorrhoidectomy      2010  . Parotidectomy      30 years ago  . Abdominal hysterectomy      1985    Family History  Problem Relation Age of Onset  . Heart failure Father   . Diabetes Mother   . Hypertension Mother   . Stroke Mother   . Cirrhosis Sister   . Hyperlipidemia Brother   . Hypertension Brother     Social History: Married. 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 She reports that she has never smoked. She has never used smokeless tobacco. She has a glass of wine occasionally. She reports that she does not use illicit drugs.    Allergies  Allergen Reactions  . Pravastatin Other (See Comments)    Leg pains  . Latex Other (See Comments)    Burns her skin    Medications Prior to Admission  Medication Sig Dispense Refill  . b complex vitamins tablet Take 1 tablet by mouth daily.    . Calcium Carb-Cholecalciferol (CALCIUM+D3) 600-800 MG-UNIT TABS Take 1 tablet by mouth daily.    . Cholecalciferol (VITAMIN D PO) Take 1 tablet by mouth daily.    .Marland Kitchen  estradiol (ESTRACE) 0.1 MG/GM vaginal cream Place 1 Applicatorful vaginally once a week.    . fesoterodine (TOVIAZ) 4 MG TB24 tablet Take 1 tablet (4 mg total) by mouth daily. (Patient taking differently: Take 4 mg by mouth daily as needed (urination frequency). ) 60 tablet  5  . lisinopril-hydrochlorothiazide (PRINZIDE,ZESTORETIC) 20-12.5 MG per tablet Take 1 tablet by mouth daily. 90 tablet 6  . loratadine (CLARITIN) 10 MG tablet Take 10 mg by mouth daily.    . Multiple Vitamin (MULTIVITAMIN WITH MINERALS) TABS tablet Take 1 tablet by mouth daily.    . naproxen sodium (ALEVE) 220 MG tablet Take 440 mg by mouth daily as needed (pain/headaches).    . Omega-3 Fatty Acids (FISH OIL) 1000 MG CAPS Take 1,000 mg by mouth daily.    . pravastatin (PRAVACHOL) 20 MG tablet Take 1 tablet (20 mg total) by mouth daily. (Patient not taking: Reported on 10/14/2014) 90 tablet 3    Home: Home Living Family/patient expects to be discharged to:: Private residence Living Arrangements: Other (Comment) (who is often not at home due to works out of city.) Available Help at Discharge: Other (Comment) (family PRN) Type of Home: House Home Access: Stairs to enter [employer] of Steps: several Entrance Stairs-Rails: Right, Left Home Layout: One level Home Equipment: Shower seat - built in, Hand held shower head  Functional History: Prior Function Level of Independence: Independent 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  Functional Status:  Mobility: Bed Mobility Overal bed mobility: Needs Assistance Bed Mobility: Sit to Supine Supine to sit: Min guard Sit to supine: Min guard General bed mobility comments: moving quickly and almost struck her head against far bed rail as she lay back Transfers Overall transfer level: Needs assistance Equipment used: None Transfers: Sit to/from Stand Sit to Stand: Min assist, +2 safety/equipment General transfer comment: weight shifts to her far Lt Ambulation/Gait Ambulation/Gait assistance: Mod assist, Max assist, +2 safety/equipment Ambulation Distance (Feet): 100 Feet Assistive device: 1 person hand held assist Gait Pattern/deviations: Step-through pattern, Decreased step length - left, Decreased stance  time - right, Decreased dorsiflexion - right, Decreased weight shift to left, Narrow base of support Gait velocity: slow General Gait Details: required more assist compared to 11/30 with incr loss of balance to her Rt; slow stepping/placing RLE and shifting forward over Rt foot with hip and knee extension; nearly 100% vc for sequencing    ADL: ADL Overall ADL's : Needs assistance/impaired Grooming: Wash/dry hands, Minimal assistance, Standing Grooming Details (indicate cue type and reason): Min (A) to maintain balance due to R lateral lean. Verbal cues provided to stand tall and straighten R knee Toilet Transfer: Minimal assistance, Ambulation, Regular Toilet, Grab bars Toilet Transfer Details (indicate cue type and reason): Min (A) to stabilize balance Toileting- Clothing Manipulation and Hygiene: Minimal assistance, Sitting/lateral lean Toileting - Clothing Manipulation Details (indicate cue type and reason): Min (A) to stabilize balance Functional mobility during ADLs: Moderate assistance General ADL Comments: 1 person hand-held assist for ambulation. Pt impulsive and demonstrating significant R lateral lean during standing and dynamic balance tasks. Verbal cues throughout session for safety and to compensate for R lean.  Cognition: Cognition Overall Cognitive Status: Impaired/Different from baseline Orientation Level: Oriented X4 Cognition Arousal/Alertness: Awake/alert Behavior During Therapy: Flat affect, Impulsive (less impulsive, but continues) Overall Cognitive Status: Impaired/Different from baseline Area of Impairment: Attention, Safety/judgement, Awareness Current Attention Level: Sustained Safety/Judgement: Decreased awareness of safety Awareness: Intellectual General Comments: pt appeared    Blood pressure 137/74, pulse 61, temperature  98.4 F (36.9 C), temperature source Oral, resp. rate 18, height 5\' 6"  (1.676 m), weight 63.2 kg (139 lb 5.3 oz), SpO2 98 %. Physical  Exam Nursing note and vitals reviewed. Constitutional: She is oriented to person, place, and time. She appears well-developed and well-nourished.  HENT: dentition fair, oral mucosa pink and moist Head: Normocephalic and atraumatic.  Eyes: Conjunctivae are normal. Pupils are equal, round, and reactive to light.  Neck: Normal range of motion. Neck supple.  Cardiovascular: Normal rate and regular rhythm. No murmurs  Respiratory: Effort normal and breath sounds normal. No respiratory distress. She has no wheezes.  GI: Soft. Bowel sounds are normal. She exhibits no distension. There is no tenderness.  Neurological: She is alert and oriented to person, place, and time.  Right facial weakness with mild hesitancy. Speech clear and able to follow 2 step commands without difficulty. RLE with decrease in fine motor control and RUE weakness proximally with significant shoulder pain with movements. RLE 4/5 prox to distal. RUE 1/5 (pain) at deltoid,  to 3/5, bicep, tricep, wrist/finger, Skin: Skin is warm and dry. No rash noted. No erythema.  Psychiatric:  Flat, some delay, sometimes difficult to engage    Lab Results Last 48 Hours    No results found for this or any previous visit (from the past 48 hour(s)).    Imaging Results (Last 48 hours)    No results found.       Medical Problem List and Plan: 1. Functional deficits secondary to Left pontine infarct.  2. DVT Prophylaxis/Anticoagulation: Pharmaceutical: Lovenox 3. Pain Management: Oxycodone prn for right shoulder pain. Ice as well as elevation for symptom management.  4. Mood: Reports of anxiety with adjustment reaction. Team to provide ego support. LCSW to follow for evaluation and support. Will consult chaplain services for follow up visit.  5. Neuropsych: This patient is capable of making decisions on her own behalf. 6. Skin/Wound Care: Routine pressure relief measures.  7. Fluids/Electrolytes/Nutrition: Monitor I/O. Offer  nutritonal supplements as needed if intake poor. Husband encouraged to bring food from home.  8. HTN: Monitor every 8 hours. Off prinizide--avoid hypotension to allow adequate perfusion.  9. Right shoulder pain: likely due to RTC pathology: Will check X rays on admit -would like for PT to address as well 10. Right Thyroid nodules/Parotid nodule: Follow up with ENT on outpatient basis 11. Hyperlipidemia: unable to tolerate statins.    Post Admission Physician Evaluation: 1. Functional deficits secondary to left pontine infarct 2. Patient is admitted to receive collaborative, interdisciplinary care between the physiatrist, rehab nursing staff, and therapy team. 3. Patient's level of medical complexity and substantial therapy needs in context of that medical necessity cannot be provided at a lesser intensity of care such as a SNF. 4. Patient has experienced substantial functional loss from his/her baseline which was documented above under the "Functional History" and "Functional Status" headings. Judging by the patient's diagnosis, physical exam, and functional history, the patient has potential for functional progress which will result in measurable gains while on inpatient rehab. These gains will be of substantial and practical use upon discharge in facilitating mobility and self-care at the household level. 5. Physiatrist will provide 24 hour management of medical needs as well as oversight of the therapy plan/treatment and provide guidance as appropriate regarding the interaction of the two. 6. 24 hour rehab nursing will assist with bladder management, bowel management, safety, skin/wound care, disease management, medication administration, pain management and patient education and help integrate therapy  concepts, techniques,education, etc. 7. PT will assess and treat for/with: Lower extremity strength, range of motion, stamina, balance, functional mobility, safety, adaptive techniques and  equipment, NMR, stroke ed, rotator cuff/scapular stabilizer strengthening and pain mgt. Goals are: supervision to mod I. 8. OT will assess and treat for/with: ADL's, functional mobility, safety, upper extremity strength, adaptive techniques and equipment, NMR, cognitive perceptual awareness, community reintegration, family ed. Goals are: supervision to mod I. Therapy may proceed with showering this patient. 9. SLP will assess and treat for/with: cognition, communication. Goals are: mod I to supervision. 10. Case Management and Social Worker will assess and treat for psychological issues and discharge planning. 11. Team conference will be held weekly to assess progress toward goals and to determine barriers to discharge. 12. Patient will receive at least 3 hours of therapy per day at least 5 days per week. 13. ELOS: 8-12 days  14. Prognosis: excellent     Ranelle Oyster, MD, [facility] Physical Medicine & Rehabilitation 10/19/2014

## 2014-10-19 NOTE — Progress Notes (Signed)
Chaplain initiated visit with pt after referral from nurse. Pt expressed that her diagnosis is difficult for her and needed some "hope" from the chaplain. Pt expressed that she has her church coming to visit her. Prayer is very important to pt. Chaplain offered prayer, brought pt prayer shawl, bible, and devotional. Pt appreciated chaplain visit. Chaplain intends to visit tomorrow.   10/19/14 1000  Clinical Encounter Type  Visited With Health care provider;Patient;Family  Visit Type Initial;Spiritual support  Referral From Nurse  Recommendations Follow Up  Spiritual Encounters  Spiritual Needs Sacred text;Prayer;Literature;Emotional  Stress Factors  Patient Stress Factors Major life changes  Macen Joslin, Mayer MaskerCourtney F, Chaplain 10/19/2014 10:21 AM

## 2014-10-19 NOTE — Progress Notes (Signed)
Rehab admissions - I am following pt's case and did receive insurance authorization from [facility] for inpatient rehab. I spoke with neuro NP and received medical clearance. Bed is available and we will admit pt to inpatient rehab later today.  I updated pt who was pleased with the plan. Pt had somewhat flat affect. Husband was not in room. I called and left voicemail update with husband and will complete admission paperwork with pt/husband later.  I called and updated pt's RN, Cathlean CowerAlesia, case manager and Dysheka with social work. Please call me with any questions. Thanks.  Juliann MuleJanine Rennie Rouch, PT Rehabilitation Admissions Coordinator [phone]

## 2014-10-19 NOTE — Progress Notes (Signed)
Physical Therapy Treatment Patient Details Name: Linus OrnFrances H Mintz-Whitcomb MRN: [id number] DOB: 11/25/1940 Today's Date: 10/19/2014    History of Present Illness 73 y.o. female admitted with onset of slurred speech and inability to move R extremities at 7:30PM on 10/14/14. NIH= 0 Symptoms resolved in route with EMS. At 2255 she developed recurrence of slurred speech and right facial weakness. Decision was made at that point to treat with IV TPA, which was administered MRI (+) L Pons PMH: HTN, Hyperlipidemia    PT Comments    Pt very flat affect and very angry re: her situation. Encouraged pt to allow a chaplain to come visit with her and she agreed. RN offered to call in referral. Leg strength improved today, however gait has deteriorated compared to 11/30 and can no longer grip RW. Goals updated based on decline in status.   Follow Up Recommendations  CIR (If [facility] inpt rehab full, recommend outside CIR)     Equipment Recommendations  Other (comment) (TBA next venue)    Recommendations for Other Services       Precautions / Restrictions Precautions Precautions: Fall    Mobility  Bed Mobility Overal bed mobility: Needs Assistance Bed Mobility: Sit to Supine       Sit to supine: Min guard   General bed mobility comments: moving quickly and almost struck her head against far bed rail as she lay back  Transfers Overall transfer level: Needs assistance Equipment used: None Transfers: Sit to/from Stand Sit to Stand: Min assist;+2 safety/equipment         General transfer comment: weight shifts to her far Lt  Ambulation/Gait Ambulation/Gait assistance: Mod assist;Max assist;+2 safety/equipment Ambulation Distance (Feet): 100 Feet Assistive device: 1 person hand held assist Gait Pattern/deviations: Step-through pattern;Decreased step length - left;Decreased stance time - right;Decreased dorsiflexion - right;Decreased weight shift to left;Narrow base of support Gait velocity:  slow   General Gait Details: required more assist compared to 11/30 with incr loss of balance to her Rt; slow stepping/placing RLE and shifting forward over Rt foot with hip and knee extension; nearly 100% vc for sequencing   Stairs            Wheelchair Mobility    Modified Rankin (Stroke Patients Only) Modified Rankin (Stroke Patients Only) Pre-Morbid Rankin Score: No symptoms Modified Rankin: Moderately severe disability     Balance Overall balance assessment: Needs assistance       Postural control: Right lateral lean Standing balance support: Single extremity supported Standing balance-Leahy Scale: Zero Standing balance comment: delayed righting response when losing balance to her Rt at times required max assist to recover                    Cognition Arousal/Alertness: Awake/alert Behavior During Therapy: Flat affect;Impulsive (less impulsive, but continues) Overall Cognitive Status: Impaired/Different from baseline Area of Impairment: Attention;Safety/judgement;Awareness   Current Attention Level: Sustained     Safety/Judgement: Decreased awareness of safety Awareness: Intellectual        Exercises      General Comments        Pertinent Vitals/Pain Pain Assessment: No/denies pain    Home Living                      Prior Function            PT Goals (current goals can now be found in the care plan section) Acute Rehab PT Goals Patient Stated Goal: back to running PT Goal  Formulation: With patient Time For Goal Achievement: 10/26/14 Potential to Achieve Goals: Good Progress towards PT goals: Goals downgraded-see care plan    Frequency  Min 4X/week    PT Plan Current plan remains appropriate    Co-evaluation             End of Session Equipment Utilized During Treatment: Gait belt Activity Tolerance: Treatment limited secondary to medical complications (Comment) Patient left: in bed;with call bell/phone within  reach;with bed alarm set     Time: [id number] PT Time Calculation (min) (ACUTE ONLY): 34 min  Charges:  $Gait Training: 23-37 mins                    G Codes:      Kamir Selover 10/19/2014, 9:59 AM Pager [phone]

## 2014-10-19 NOTE — Progress Notes (Signed)
Report given to Mineral RidgeKimberly from rehab. Patient will be transfer to 4w22. All belongings sent with patient. Husband at bedside.  Sim BoastHavy, RN

## 2014-10-19 NOTE — Progress Notes (Signed)
Patient admitted from 4N.  Patient alert and oriented x4; denies pain; vitals obtained.  Patient oriented to room and unit.  Will continue to monitor.

## 2014-10-19 NOTE — PMR Pre-admission (Signed)
PMR Admission Coordinator Pre-Admission Assessment  Patient: Teresa Martinez is an 73 y.o., female MRN: [id number] DOB: Sep 07, 1941 Height: _0  (167.6 cm) Weight: 63.2 kg (139 lb 5.3 oz)              Insurance Information HMO:     PPO: yes     PCP:      IPA:      80/20:      OTHER:  PRIMARY: [employer] Choice      Policy#: [id number]      Subscriber: self CM Name: Bonnita Hollow, RN      Phone#: [phone], ext. [phone]     Fax#: [phone] Approval given for seven days from [place] (phone: [phone], ext. 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 Date: 11-18-2012     Deduct: none      Out of Pocket Max: $4000 (met $80.00)      Life Max: unlimited CIR: 100%, pre-auth needed     SNF: 100% days 1-20; $50/day for days 21-50 (visit limit based on medical necessity, pre-auth needed) Outpatient: 100%     Co-Pay: $20, visit limit based on med. necessity Home Health: 100%      Co-Pay: none, visit limit based on med. necessity DME: 80%     Co-Pay: 20% Providers: in network  Emergency [place]    Name Relation Home Work [place] Spouse [phone]     Current Medical History  Patient Admitting Diagnosis: left pontine infarct  History of Present Illness: Teresa Martinez is a 73 y.o. RH-female with history of HTN, hyperlipidemia who was admitted on 10/14/14 with acute onset of slurred speech and right sided weakness. Patient with sputtering symptoms and was treated with tPA due to recurrent nature of symptoms. CTA head/neck with 50% stenosis of R-ICA with luminal irregularity of bilateral ICA question fibromuscular dysplasia, severe calcific atherosclerosis of the carotid siphon and mild luminal irregularity of VA with extrinsic compression due to  cervical spine degenerative changes. MRI/MRA brain done revealing acute left pontine infarct with lobular mass right parotid gland.  CT cervical spine with right parotid mass, right thyroid nodules and cervical spondylosis. CT abdomen/pelvis without significant abnormality. 2D echo with EF 55-60& with no wall abnormality but grade 2 diastolic dysfunction and redundancy of atrial septum with borderline criteria for aneurysm. Patient was started on ASA for secondary stroke prevention. Patient with resultant mild right hemiparesis as well as right lean when fatigue with ambulation. CIR recommended by MD and rehab team.  NIH Total: 3  Past Medical History  Past Medical History  Diagnosis Date  . Hyperlipemia   . Statin intolerance   . Menopausal syndrome     Teresa Martinez [place])  . Hypertension   . Overactive bladder   . Cervical spine degeneration     C5/C6  . Chronic right shoulder pain     Family History  family history includes Cirrhosis in her sister; Diabetes in her mother; Heart failure in her father; Hyperlipidemia in her brother; Hypertension in her brother and mother; Stroke in her mother.  Prior Rehab/Hospitalizations: none   Current Medications  Current facility-administered medications: 0.9 %  sodium chloride infusion, , Intravenous, Continuous, Donzetta Starch, NP, Last Rate: 75 mL/hr at 10/19/14 0037;  acetaminophen (TYLENOL) tablet 650  mg, 650 mg, Oral, Q4H PRN **OR** acetaminophen (TYLENOL) suppository 650 mg, 650 mg, Rectal, Q4H PRN, Wallie Char;  aspirin tablet 325 mg, 325 mg, Oral, Daily, Wallie Char, 325 mg at 10/19/14 0848 bisacodyl (DULCOLAX) EC tablet 5 mg, 5 mg, Oral, Daily PRN, Antony Contras, MD;  enoxaparin (LOVENOX) injection 40 mg, 40 mg, Subcutaneous, Daily, Donzetta Starch, NP, 40 mg at 10/19/14 0848;  labetalol (NORMODYNE,TRANDATE) injection 10 mg, 10 mg, Intravenous, Q10 min PRN, Wallie Char;  pantoprazole (PROTONIX) EC tablet 40 mg, 40 mg, Oral, QHS, Antony Contras, MD, 40 mg at 10/18/14 2117 traMADol (ULTRAM) tablet 100 mg, 100 mg, Oral, Q8H PRN, Donzetta Starch, NP, 100 mg at 10/17/14 2243  Patients Current Diet: Diet Heart  Precautions / Restrictions Precautions Precautions: Fall Restrictions Weight Bearing Restrictions: No   Prior Activity Level Community (5-7x/wk): Pt was very active prior to CVA and 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 She enjoys reading mysteries and watching TV.   Home Assistive Devices / Equipment Home Assistive Devices/Equipment: Eyeglasses Home Equipment: Shower seat - built in, Hand held shower head  Prior Functional Level Prior Function Level of Independence: Independent 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  Current Functional Level Cognition  Overall Cognitive Status: Impaired/Different from baseline Current Attention Level: Sustained Orientation Level: Oriented X4 Safety/Judgement: Decreased awareness of safety General Comments: pt appeared     Extremity Assessment (includes Sensation/Coordination)          ADLs  Overall ADL's : Needs assistance/impaired Grooming: Wash/dry hands, Minimal assistance, Standing Grooming Details (indicate cue type and reason): Min (A) to maintain balance due to R lateral lean. Verbal cues provided to stand tall and straighten R knee Toilet Transfer: Minimal assistance, Ambulation, Regular Toilet, Grab bars Toilet Transfer Details (indicate cue type and reason): Min (A) to stabilize balance Toileting- Clothing Manipulation and Hygiene: Minimal assistance, Sitting/lateral lean Toileting - Clothing Manipulation Details (indicate cue type and reason): Min (A) to stabilize balance Functional mobility during ADLs: Moderate assistance General ADL Comments: 1 person hand-held assist for ambulation. Pt impulsive and demonstrating significant R lateral lean during standing and dynamic balance tasks. Verbal cues  throughout session for safety and to compensate for R lean.    Mobility  Overal bed mobility: Needs Assistance Bed Mobility: Sit to Supine Supine to sit: Min guard Sit to supine: Min guard General bed mobility comments: moving quickly and almost struck her head against far bed rail as she lay back    Transfers  Overall transfer level: Needs assistance Equipment used: None Transfers: Sit to/from Stand Sit to Stand: Min assist, +2 safety/equipment General transfer comment: weight shifts to her far Lt    Ambulation / Gait / Stairs / Wheelchair Mobility  Ambulation/Gait Ambulation/Gait assistance: Mod assist, Max assist, +2 safety/equipment Ambulation Distance (Feet): 100 Feet Assistive device: 1 person hand held assist Gait Pattern/deviations: Step-through pattern, Decreased step length - left, Decreased stance time - right, Decreased dorsiflexion - right, Decreased weight shift to left, Narrow base of support Gait velocity: slow General Gait Details: required more assist compared to 11/30 with incr loss of balance to her Rt; slow stepping/placing RLE and shifting forward over Rt foot with hip and knee extension; nearly 100% vc for sequencing    Posture / Balance      Special needs/care consideration BiPAP/CPAP no CPM no  Continuous Drip IV no Dialysis no         Life  Vest no  Oxygen no  Special Bed no  Trach Size no  Wound Vac (area) no       Skin - no current issues                               Bowel mgmt: last BM 10-16-14 Bladder mgmt: using bedpan or BSC with assistance Diabetic mgmt no  PT note documented that pt was having some anger responses to her current medical condition and PT/RN were going to get a chaplain referral. Pt was agreeable to see a chaplain.    Previous Home Environment Living Arrangements: Other (Comment) (Pt's husband works four days/week and is often not at home due to works out of city. Husband states he can take some time off work to be with pt  if needed. He is considering other social supports to help if needed but could not share specifics when I asked him about this.) Available Help at Discharge: Other (Comment) (family PRN) Type of Home: House Home Layout: One level Home Access: Stairs to enter Entrance Stairs-Rails: Right, Left Entrance Stairs-Number of Steps: several Bathroom Shower/Tub: Gaffer, Charity fundraiser: [place]: No  Discharge Living Setting Plans for Discharge Living Setting: Patient's home Type of Home at Discharge: House Discharge Home Layout: One level Discharge Home Access: Stairs to enter Entrance Stairs-Rails: Left Entrance Stairs-Number of Steps: 3 (3 steps at back entrance, 5 steps at front door) Does the patient have any problems obtaining your medications?: No  Social/Family/Support Systems Patient Roles: Spouse, Other (Comment) (works as a Oceanographer) Sport and exercise psychologist Information: husband Jeneen Rinks is primary contact Anticipated Caregiver: husband (though husband works). He is figuring out other possible support options and can take some time off work initially if needed. Anticipated Caregiver's Contact Information: see above Ability/Limitations of Caregiver: Husband does work 4 days/week but has some flexibility with his schedule. He can also take some time off work initially to be with pt if needed. Caregiver Availability: Intermittent (see previous comments about husband's work schedule) Discharge Plan Discussed with Primary Caregiver: Yes Is Caregiver In Agreement with Plan?: Yes Does Caregiver/Family have Issues with Lodging/Transportation while Pt is in Rehab?: No  Goals/Additional Needs Patient/Family Goal for Rehab: Mod Ind and Supervision with PT/OT/SLP Expected length of stay: 7-10 days  Cultural Considerations: none Dietary Needs: heart healthy Equipment Needs: to be determined Pt/Family Agrees to Admission and willing to participate: Yes (met with pt and  her husband on 11-30 and 12-1) Program Orientation Provided & Reviewed with Pt/Caregiver Including Roles  & Responsibilities: Yes   Decrease burden of Care through IP rehab admission: NA   Possible need for SNF placement upon discharge: not anticipated  Patient Condition: This patient's condition remains as documented in the consult dated 10-17-14, in which the Rehabilitation Physician determined and documented that the patient's condition is appropriate for intensive rehabilitative care in an inpatient rehabilitation facility. Will admit to inpatient rehab today.  Preadmission Screen Completed By:  Nanetta Batty, PT, 10/19/2014 12:27 PM ______________________________________________________________________   Discussed status with Dr. Naaman Plummer on 10-19-14 at 1229 and received telephone approval for admission today.  Admission Coordinator:  Nanetta Batty, PT, time 1229/Date 10-19-14

## 2014-10-20 ENCOUNTER — Inpatient Hospital Stay (HOSPITAL_COMMUNITY): Payer: Medicare PPO | Admitting: Rehabilitation

## 2014-10-20 ENCOUNTER — Inpatient Hospital Stay (HOSPITAL_COMMUNITY): Payer: Medicare PPO | Admitting: Speech Pathology

## 2014-10-20 ENCOUNTER — Inpatient Hospital Stay (HOSPITAL_COMMUNITY): Payer: Medicare PPO | Admitting: Occupational Therapy

## 2014-10-20 DIAGNOSIS — G8110 Spastic hemiplegia affecting unspecified side: Secondary | ICD-10-CM

## 2014-10-20 LAB — CBC WITH DIFFERENTIAL/PLATELET
Basophils Absolute: 0 10*3/uL (ref 0.0–0.1)
Basophils Relative: 0 % (ref 0–1)
Eosinophils Absolute: 0.3 10*3/uL (ref 0.0–0.7)
Eosinophils Relative: 5 % (ref 0–5)
HCT: 39.2 % (ref 36.0–46.0)
Hemoglobin: 12.6 g/dL (ref 12.0–15.0)
Lymphocytes Relative: 36 % (ref 12–46)
Lymphs Abs: 2.3 10*3/uL (ref 0.7–4.0)
MCH: 25.8 pg — ABNORMAL LOW (ref 26.0–34.0)
MCHC: 32.1 g/dL (ref 30.0–36.0)
MCV: 80.3 fL (ref 78.0–100.0)
Monocytes Absolute: 0.5 10*3/uL (ref 0.1–1.0)
Monocytes Relative: 8 % (ref 3–12)
Neutro Abs: 3.2 10*3/uL (ref 1.7–7.7)
Neutrophils Relative %: 51 % (ref 43–77)
Platelets: 224 10*3/uL (ref 150–400)
RBC: 4.88 MIL/uL (ref 3.87–5.11)
RDW: 14.4 % (ref 11.5–15.5)
WBC: 6.3 10*3/uL (ref 4.0–10.5)

## 2014-10-20 LAB — COMPREHENSIVE METABOLIC PANEL
ALT: 12 U/L (ref 0–35)
AST: 16 U/L (ref 0–37)
Albumin: 3.3 g/dL — ABNORMAL LOW (ref 3.5–5.2)
Alkaline Phosphatase: 48 U/L (ref 39–117)
Anion gap: 14 (ref 5–15)
BUN: 17 mg/dL (ref 6–23)
CO2: 23 mEq/L (ref 19–32)
Calcium: 9.2 mg/dL (ref 8.4–10.5)
Chloride: 107 mEq/L (ref 96–112)
Creatinine, Ser: 0.82 mg/dL (ref 0.50–1.10)
GFR calc Af Amer: 79 mL/min — ABNORMAL LOW (ref >90)
GFR calc non Af Amer: 68 mL/min — ABNORMAL LOW (ref >90)
Glucose, Bld: 89 mg/dL (ref 70–99)
Potassium: 4 mEq/L (ref 3.7–5.3)
Sodium: 144 mEq/L (ref 137–147)
Total Bilirubin: 0.2 mg/dL — ABNORMAL LOW (ref 0.3–1.2)
Total Protein: 6.3 g/dL (ref 6.0–8.3)

## 2014-10-20 MED ORDER — DICLOFENAC SODIUM 1 % TD GEL
2.0000 g | Freq: Four times a day (QID) | TRANSDERMAL | Status: DC
  Administered 2014-10-20 – 2014-11-01 (×40): 2 g via TOPICAL
  Filled 2014-10-20: qty 100

## 2014-10-20 NOTE — Plan of Care (Signed)
Problem: RH BLADDER ELIMINATION Goal: RH STG MANAGE BLADDER WITH ASSISTANCE STG Manage Bladder With Min Assistance  Outcome: Progressing  Problem: RH PAIN MANAGEMENT Goal: RH STG PAIN MANAGED AT OR BELOW PT'S PAIN GOAL < 3 on a 0-10 pain scale.  Outcome: Progressing  Problem: RH SKIN INTEGRITY Goal: RH STG SKIN FREE OF INFECTION/BREAKDOWN Remain free from infection/breakdown while on rehab with min assist.  Outcome: Progressing  Problem: RH SAFETY Goal: RH STG DECREASED RISK OF FALL WITH ASSISTANCE STG Decreased Risk of Fall With Min Assistance.  Outcome: Progressing     

## 2014-10-20 NOTE — Care Management Note (Signed)
[facility]patient Rehabilitation Center Individual Statement of Services  Patient Name:  Teresa SchaumannFrances H Mintz-Whitcomb  Date:  10/20/2014  Welcome to the [facility].  Our goal is to provide you with an individualized program based on your diagnosis and situation, designed to meet your specific needs.  With this comprehensive rehabilitation program, you will be expected to participate in at least 3 hours of rehabilitation therapies Monday-Friday, with modified therapy programming on the weekends.  Your rehabilitation program will include the following services:  Physical Therapy (PT), Occupational Therapy (OT), Speech Therapy (ST), 24 hour per day rehabilitation nursing, Neuropsychology, Case Management (Social Worker), Rehabilitation Medicine, Nutrition Services and Pharmacy Services  Weekly team conferences will be held on Wednesday to discuss your progress.  Your Social Worker will talk with you frequently to get your input and to update you on team discussions.  Team conferences with you and your family in attendance may also be held.  Expected length of stay: 12-14 days  Overall anticipated outcome: supervision with cues  Depending on your progress and recovery, your program may change. Your Social Worker will coordinate services and will keep you informed of any changes. Your Social Worker's name and contact numbers are listed  below.  The following services may also be recommended but are not provided by the Inpatient Rehabilitation Center:   Driving Evaluations  Home Health Rehabiltiation Services  Outpatient Rehabilitation Services  Vocational Rehabilitation   Arrangements will be made to provide these services after discharge if needed.  Arrangements include referral to agencies that provide these services.  Your insurance has been verified to be:  [employer] [employer] Your primary doctor is:  Eleonore Chiquitoeter Kwiatkowski  Pertinent information will be shared with your doctor and your  insurance company.  Social Worker:  Dossie DerBecky Loranda Mastel, SW [phone] or [phone]) [phone]  Information discussed with and copy given to patient by: Lucy Chrisupree, Chrisean Kloth G, 10/20/2014, 1:55 PM

## 2014-10-20 NOTE — Plan of Care (Signed)
Problem: RH SAFETY Goal: RH OTHER STG SAFETY GOALS W/ASSIST Other STG Safety Goals With Assistance.  Outcome: Not Applicable Date Met:  95/74/73

## 2014-10-20 NOTE — Progress Notes (Signed)
Ranelle OysterZachary T Swartz, MD Physician Signed Physical Medicine and Rehabilitation Consult Note 10/17/2014 11:34 AM  Related encounter: ED to Hosp-Admission (Discharged) from 10/14/2014 [facility]    Expand All Collapse All        Physical Medicine and Rehabilitation Consult   Reason for Consult: Right sided weakness and slurred speech Referring Physician: Dr. Pearlean BrownieSethi.    HPI: Teresa Martinez is a 73 y.o. RH-female with history of HTN, hyperlipidemia who was admitted on 10/14/14 with acute onset of slurred speech and right sided weakness. Patient with sputtering symptoms and was treated with tPA due to recurrent nature of symptoms. CTA head/neck with 50% stenosis of R-ICA with luminal irregularity of bilateral ICA question fibromuscular dysplasia, severe calcific atherosclerosis of the carotid siphon and mild luminal irregularity of VA with extrinsic compression due to cervical spine degenerative changes. MRI/MRA brain done revealing acute left pontine infarct with lobular mass right parotid gland. CT cervical spine with right parotid mass, right thyroid nodules and cervical spondylosis. CT abdomen/pelvis without significant abnormality. 2D echo with EF 55-60& with no wall abnormality but grade 2 diastolic dysfunction and redundancy of atrial septum with borderline criteria for aneurysm. Patient was started on ASA for secondary stroke prevention. Patient with resultant mild right hemiparesis as well as right lean when fatigue with ambulation. CIR recommended by MD and rehab team.    Review of Systems  HENT: Negative for hearing loss.  Eyes: Negative for blurred vision and double vision.  Respiratory: Negative for cough and shortness of breath.  Cardiovascular: Negative for chest pain and palpitations.  Gastrointestinal: Negative for heartburn, nausea and abdominal pain.  Genitourinary: Positive for urgency and frequency.  Musculoskeletal: Positive  for joint pain (right shoulder with acute on chronic pain).  Neurological: Positive for speech change and focal weakness. Negative for headaches.      Past Medical History  Diagnosis Date  . Hyperlipemia   . Statin intolerance   . Menopausal syndrome     [name]  . Hypertension   . Overactive bladder     Past Surgical History  Procedure Laterality Date  . Cholecystectomy      1950  . Band hemorrhoidectomy      2010  . Parotidectomy      30 years ago  . Abdominal hysterectomy      1985    Family History  Problem Relation Age of Onset  . Heart failure Father   . Diabetes Mother   . Hypertension Mother   . Stroke Mother   . Cirrhosis Sister   . Hyperlipidemia Brother   . Hypertension Brother     Social History: Married. 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 She reports that she has never smoked. She has never used smokeless tobacco. She has a glass of wine occasionally. She reports that she does not use illicit drugs.    Allergies  Allergen Reactions  . Pravastatin Other (See Comments)    Leg pains  . Latex Other (See Comments)    Burns her skin   Medications Prior to Admission  Medication Sig Dispense Refill  . b complex vitamins tablet Take 1 tablet by mouth daily.    . Calcium Carb-Cholecalciferol (CALCIUM+D3) 600-800 MG-UNIT TABS Take 1 tablet by mouth daily.    . Cholecalciferol (VITAMIN D PO) Take 1 tablet by mouth daily.    Marland Kitchen. estradiol (ESTRACE) 0.1 MG/GM vaginal cream Place  1 Applicatorful vaginally once a week.    . fesoterodine (TOVIAZ) 4 MG TB24 tablet Take 1 tablet (4 mg total) by mouth daily. (Patient taking differently: Take 4 mg by mouth daily as needed (urination frequency). ) 60 tablet 5  . lisinopril-hydrochlorothiazide  (PRINZIDE,ZESTORETIC) 20-12.5 MG per tablet Take 1 tablet by mouth daily. 90 tablet 6  . loratadine (CLARITIN) 10 MG tablet Take 10 mg by mouth daily.    . Multiple Vitamin (MULTIVITAMIN WITH MINERALS) TABS tablet Take 1 tablet by mouth daily.    . naproxen sodium (ALEVE) 220 MG tablet Take 440 mg by mouth daily as needed (pain/headaches).    . Omega-3 Fatty Acids (FISH OIL) 1000 MG CAPS Take 1,000 mg by mouth daily.    . pravastatin (PRAVACHOL) 20 MG tablet Take 1 tablet (20 mg total) by mouth daily. (Patient not taking: Reported on 10/14/2014) 90 tablet 3    Home: Home Living Family/patient expects to be discharged to:: Private residence Living Arrangements: Other (Comment) (who is often not at home due to works out of city.) Available Help at Discharge: Other (Comment) (family PRN) Type of Home: House Home Access: Stairs to enter [employer] of Steps: several Entrance Stairs-Rails: Right, Left Home Layout: One level Home Equipment: None  Functional History: Prior Function Level of Independence: Independent 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 Functional Status:  Mobility: Bed Mobility General bed mobility comments: not tested Transfers Overall transfer level: Needs assistance Equipment used: 1 person hand held assist Transfers: Sit to/from Stand Sit to Stand: Min assist General transfer comment: stability assist Ambulation/Gait Ambulation/Gait assistance: Min assist (occasional light mod assist with fatigue) Ambulation Distance (Feet): 110 Feet Assistive device: 1 person hand held assist Gait Pattern/deviations: Step-through pattern, Scissoring, Shuffle, Ataxic Gait velocity: slow General Gait Details: mildly unsteady overall. mild to moderate lean to Right worsening with fatigue.     ADL:    Cognition: Cognition Overall Cognitive Status: Within Functional Limits for tasks assessed Orientation Level: Oriented  X4 Cognition Arousal/Alertness: Awake/alert Behavior During Therapy: Flat affect, WFL for tasks assessed/performed Overall Cognitive Status: Within Functional Limits for tasks assessed  Blood pressure 119/50, pulse 73, temperature 98 F (36.7 C), temperature source Oral, resp. rate 20, height 5\' 6"  (1.676 m), weight 63.2 kg (139 lb 5.3 oz), SpO2 100 %. Physical Exam  Nursing note and vitals reviewed. Constitutional: She is oriented to person, place, and time. She appears well-developed and well-nourished.  HENT:  Head: Normocephalic and atraumatic.  Eyes: Conjunctivae are normal. Pupils are equal, round, and reactive to light.  Neck: Normal range of motion. Neck supple.  Cardiovascular: Normal rate and regular rhythm.  Respiratory: Effort normal and breath sounds normal. No respiratory distress. She has no wheezes.  GI: Soft. Bowel sounds are normal. She exhibits no distension. There is no tenderness.  Neurological: She is alert and oriented to person, place, and time.  Right facial weakness with mild hesitancy. Speech clear and able to follow 2 step commands without difficulty. RLE with decrease in fine motor control and RUE weakness proximally with significant shoulder pain with movements. RLE 4/5 prox to distal. RUE 1/5 (pain) to 3/5 wrist/finger, biceps  Skin: Skin is warm and dry. No rash noted. No erythema.  Psychiatric:  Flat, some delay     Lab Results Last 24 Hours    No results found for this or any previous visit (from the past 24 hour(s)).    Imaging Results (Last 48 hours)    Ct Head Wo Contrast  10/15/2014 CLINICAL DATA: 73 year old female status post TPA administration of 4 acute cerebral vascular accident. Right-sided weakness and dysarthria EXAM: CT HEAD WITHOUT CONTRAST TECHNIQUE: Contiguous axial images were obtained from the base of the skull through the vertex without intravenous contrast. COMPARISON: Recent CT scan of the head 10/14/2014; brain MRI  10/15/2014 FINDINGS: No evidence of intracranial hemorrhage, mass lesion, mass effect or hydrocephalus. Ill-defined hypoattenuation in the left hemi pons consistent with known acute infarct. This is better demonstrated on the recent MRI. Incompletely imaged right parotid lesion also better demonstrated on recent prior imaging. No focal soft tissue or calvarial abnormality. Normal aeration of the mastoid air cells and visualized paranasal sinuses. IMPRESSION: 1. No evidence of intracranial hemorrhage or new acute abnormality following t-PA administration. 2. Subtle hypoattenuation in the left hemi pons consistent with known acute infarct. Abnormality better demonstrated on recent MRI. Electronically Signed By: Malachy Moan M.D. On: 10/15/2014 22:05   Ct Soft Tissue Neck W Contrast  10/15/2014 CLINICAL DATA: Right parotid mass on MRI and CTA neck EXAM: CT NECK WITH CONTRAST TECHNIQUE: Multidetector CT imaging of the neck was performed using the standard protocol following the bolus administration of intravenous contrast. CONTRAST: 75mL OMNIPAQUE IOHEXOL 300 MG/ML SOLN COMPARISON: CTA neck 10/14/2014. MRI head 10/15/2014 FINDINGS: Lobular solid mass infiltrates most of the right parotid gland. The gland has homogeneous density and mild diffuse enhancement. No cystic change or calcification. This involves the superficial lobe but not the deep lobe. The deep lobe appears atrophic on the right. The left parotid gland is normal. Submandibular glands are normal. No adenopathy in the neck. The tongue and pharynx are normal. Epiglottis and larynx are normal. Right thyroid nodule measures 12 x 14 mm. Posterior right thyroid nodule measures 9 mm. Small nodules in the left thyroid lobe. Mild disc degeneration and spondylosis at C5-6. No acute bony change. IMPRESSION: The right parotid gland is diffusely abnormal with a lobular contour over and replacement of normal glandular tissue with  lobular soft tissue density. No associated calcification in the gland. Atrophy of the deep lobe of the right parotid gland. No adenopathy. Remains salivary glands are normal. Differential diagnosis includes neoplasm such as lymphoma of the parotid gland. Primary carcinoma or metastatic disease to the gland considered less likely. Chronic inflammation of the gland is a consideration. Biopsy will be necessary for diagnosis. Right thyroid nodules. Electronically Signed By: Marlan Palau M.D. On: 10/15/2014 16:54   Ct Chest W Contrast  10/16/2014 CLINICAL DATA: Compression deformity of vertebra. Brain cancer. EXAM: CT CHEST, ABDOMEN, AND PELVIS WITH CONTRAST TECHNIQUE: Multidetector CT imaging of the chest, abdomen and pelvis was performed following the standard protocol during bolus administration of intravenous contrast. CONTRAST: OMNIPAQUE IOHEXOL 300 MG/ML SOLN COMPARISON: MRI scan of head and CT scan of neck of October 15, 2014. CT scan of abdomen pelvis of November 28, 2003. FINDINGS: CT CHEST FINDINGS No pneumothorax or pleural effusion is noted. No acute pulmonary disease is noted. No pulmonary nodule or mass is noted. No mediastinal mass or adenopathy is noted. There is no evidence of thoracic aortic dissection or aneurysm. No significant osseous abnormality is noted in the chest. CT ABDOMEN AND PELVIS FINDINGS Status post cholecystectomy. No focal abnormality is noted in the liver, spleen or pancreas. Adrenal glands and kidneys appear normal. No hydronephrosis or renal obstruction is noted. The appendix appears normal. There is no evidence of bowel obstruction. Atherosclerotic calcifications of abdominal aorta and iliac arteries are noted without aneurysm formation. No abnormal fluid  collection is noted. Urinary bladder appears normal. Status post hysterectomy. No significant adenopathy is noted. Severe degenerative disc disease is noted at L5-S1. IMPRESSION: No significant  abnormality seen in the chest. Status post cholecystectomy and hysterectomy. No acute abnormality seen in the abdomen or pelvis. Electronically Signed By: Roque Lias M.D. On: 10/16/2014 16:41   Ct Cervical Spine Wo Contrast  10/16/2014 CLINICAL DATA: Right parotid lesion. Compression deformity of vertebra. EXAM: CT CERVICAL SPINE WITHOUT CONTRAST TECHNIQUE: Multidetector CT imaging of the cervical spine was performed without intravenous contrast. Multiplanar CT image reconstructions were also generated. COMPARISON: CT neck 10/15/2014 FINDINGS: Detailed images the cervical spine were reconstructed from the CT neck images obtained on 10/15/2014. No additional radiation or scanning was necessary. Normal cervical alignment. Negative for fracture. No spinal mass identified. No sclerotic or lytic lesion. C1-2: Degenerative change of the dens and C1 junction with joint space narrowing, spurring and sclerosis of the dens. No erosive change. C2-3: Negative C3-4: Moderate facet degeneration left greater than right C4-5: Mild disc degeneration. Bilateral mild facet degeneration C5-6: Disc degeneration and spondylosis with diffuse uncinate spurring. This is causing mild spinal stenosis and moderate foraminal encroachment bilaterally. C6-7: Mild disc degeneration and spondylosis. Mild facet degeneration bilaterally. C7-T1: Negative Right thyroid nodules. These measure 12 x 16 mm anteriorly, and 8 mm posteriorly on the right. Lobular mass involving the right parotid gland is described in the CT neck report IMPRESSION: Right parotid lesion Right thyroid nodules Cervical spondylosis most severe at C5-6 with mild spinal stenosis and moderate foraminal encroachment bilaterally. No cervical spine fracture or mass lesion. Electronically Signed By: Marlan Palau M.D. On: 10/16/2014 10:25   Mr Brain Wo Contrast  10/15/2014 CLINICAL DATA: Stroke. TPA. Slurred speech and right-sided  weakness. EXAM: MRI HEAD WITHOUT CONTRAST MRA HEAD WITHOUT CONTRAST TECHNIQUE: Multiplanar, multiecho pulse sequences of the brain and surrounding structures were obtained without intravenous contrast. Angiographic images of the head were obtained using MRA technique without contrast. COMPARISON: CT head 10/14/2014 FINDINGS: MRI HEAD FINDINGS Image quality degraded by mild motion. Mild atrophy. Ventricle size normal for Mild atrophy. Acute infarct in the left pons with restricted diffusion. This involves much of the left pons. Mild chronic microvascular ischemic changes in the white matter. Brainstem and cerebellum intact. Negative for hemorrhage No intracranial mass lesion. Lobular contour and asymmetric density of the right parotid gland as noted on prior CTA neck. This has an unusual appearance. Neoplasm not excluded MRA HEAD FINDINGS Image quality degraded by motion. Severe stenosis distal right vertebral artery. Mild disease distal left vertebral artery. Basilar artery diffusely diseased with but patent. Severe irregularity and multiple areas of stenosis in the posterior cerebral artery bilaterally. Atherosclerotic irregularity in the cavernous carotid bilaterally without significant stenosis. Atherosclerotic irregularity throughout the anterior and middle cerebral arteries bilaterally compatible with moderately severe atherosclerotic disease. No large vessel occlusion. Negative for cerebral aneurysm. IMPRESSION: Acute infarct left pons Chronic microvascular ischemic change Lobular mass in the right parotid gland with an unusual appearance and similar that seen on prior CT. Recommend CT neck with contrast for further evaluation. Neoplasm and lymphoma in the differential. Moderate to severe intracranial atherosclerotic disease. No large vessel occlusion. Electronically Signed By: Marlan Palau M.D. On: 10/15/2014 11:55   Ct Abdomen Pelvis W Contrast  10/16/2014 CLINICAL DATA:  Compression deformity of vertebra. Brain cancer. EXAM: CT CHEST, ABDOMEN, AND PELVIS WITH CONTRAST TECHNIQUE: Multidetector CT imaging of the chest, abdomen and pelvis was performed following the standard protocol during bolus administration of intravenous  contrast. CONTRAST: OMNIPAQUE IOHEXOL 300 MG/ML SOLN COMPARISON: MRI scan of head and CT scan of neck of October 15, 2014. CT scan of abdomen pelvis of November 28, 2003. FINDINGS: CT CHEST FINDINGS No pneumothorax or pleural effusion is noted. No acute pulmonary disease is noted. No pulmonary nodule or mass is noted. No mediastinal mass or adenopathy is noted. There is no evidence of thoracic aortic dissection or aneurysm. No significant osseous abnormality is noted in the chest. CT ABDOMEN AND PELVIS FINDINGS Status post cholecystectomy. No focal abnormality is noted in the liver, spleen or pancreas. Adrenal glands and kidneys appear normal. No hydronephrosis or renal obstruction is noted. The appendix appears normal. There is no evidence of bowel obstruction. Atherosclerotic calcifications of abdominal aorta and iliac arteries are noted without aneurysm formation. No abnormal fluid collection is noted. Urinary bladder appears normal. Status post hysterectomy. No significant adenopathy is noted. Severe degenerative disc disease is noted at L5-S1. IMPRESSION: No significant abnormality seen in the chest. Status post cholecystectomy and hysterectomy. No acute abnormality seen in the abdomen or pelvis. Electronically Signed By: Roque Lias M.D. On: 10/16/2014 16:41   Mr Maxine Glenn Head/brain Wo Cm  10/15/2014 CLINICAL DATA: Stroke. TPA. Slurred speech and right-sided weakness. EXAM: MRI HEAD WITHOUT CONTRAST MRA HEAD WITHOUT CONTRAST TECHNIQUE: Multiplanar, multiecho pulse sequences of the brain and surrounding structures were obtained without intravenous contrast. Angiographic images of the head were obtained using MRA technique  without contrast. COMPARISON: CT head 10/14/2014 FINDINGS: MRI HEAD FINDINGS Image quality degraded by mild motion. Mild atrophy. Ventricle size normal for Mild atrophy. Acute infarct in the left pons with restricted diffusion. This involves much of the left pons. Mild chronic microvascular ischemic changes in the white matter. Brainstem and cerebellum intact. Negative for hemorrhage No intracranial mass lesion. Lobular contour and asymmetric density of the right parotid gland as noted on prior CTA neck. This has an unusual appearance. Neoplasm not excluded MRA HEAD FINDINGS Image quality degraded by motion. Severe stenosis distal right vertebral artery. Mild disease distal left vertebral artery. Basilar artery diffusely diseased with but patent. Severe irregularity and multiple areas of stenosis in the posterior cerebral artery bilaterally. Atherosclerotic irregularity in the cavernous carotid bilaterally without significant stenosis. Atherosclerotic irregularity throughout the anterior and middle cerebral arteries bilaterally compatible with moderately severe atherosclerotic disease. No large vessel occlusion. Negative for cerebral aneurysm. IMPRESSION: Acute infarct left pons Chronic microvascular ischemic change Lobular mass in the right parotid gland with an unusual appearance and similar that seen on prior CT. Recommend CT neck with contrast for further evaluation. Neoplasm and lymphoma in the differential. Moderate to severe intracranial atherosclerotic disease. No large vessel occlusion. Electronically Signed By: Marlan Palau M.D. On: 10/15/2014 11:55     Assessment/Plan: Diagnosis: left pontine infarct 1. Does the need for close, 24 hr/day medical supervision in concert with the patient's rehab needs make it unreasonable for this patient to be served in a less intensive setting? Yes 2. Co-Morbidities requiring supervision/potential complications: htn, ?right RTC  injury 3. Due to bladder management, bowel management, safety, skin/wound care, disease management, medication administration, pain management and patient education, does the patient require 24 hr/day rehab nursing? Yes 4. Does the patient require coordinated care of a physician, rehab nurse, PT (1-2 hrs/day, 5 days/week), OT (1-2 hrs/day, 5 days/week) and SLP (1-2 hrs/day, 5 days/week) to address physical and functional deficits in the context of the above medical diagnosis(es)? Yes Addressing deficits in the following areas: balance, endurance, locomotion, strength, transferring, bowel/bladder  control, bathing, dressing, feeding, grooming, toileting, cognition, speech and psychosocial support 5. Can the patient actively participate in an intensive therapy program of at least 3 hrs of therapy per day at least 5 days per week? Yes 6. The potential for patient to make measurable gains while on inpatient rehab is excellent 7. Anticipated functional outcomes upon discharge from inpatient rehab are modified independent and supervision with PT, modified independent and supervision with OT, modified independent and supervision with SLP. 8. Estimated rehab length of stay to reach the above functional goals is: 7-10 days 9. Does the patient have adequate social supports and living environment to accommodate these discharge functional goals? Yes 10. Anticipated D/C setting: Home 11. Anticipated post D/C treatments: HH therapy and Outpatient therapy 12. Overall Rehab/Functional Prognosis: good  RECOMMENDATIONS: This patient's condition is appropriate for continued rehabilitative care in the following setting: CIR Patient has agreed to participate in recommended program. Yes Note that insurance prior authorization may be required for reimbursement for recommended care.  Comment: Rehab Admissions Coordinator to follow up.  Thanks,  Ranelle OysterZachary T. Swartz, MD, [email or web address]     10/17/2014       Revision  History     Date/Time User Provider Type Action   10/17/2014 3:46 PM Ranelle OysterZachary T Swartz, MD Physician Sign   10/17/2014 12:55 PM Jacquelynn CreePamela S Love, PA-C Physician Assistant Share   View Details Report       Routing History     Date/Time From To Method   10/17/2014 3:46 PM Ranelle OysterZachary T Swartz, MD Ranelle OysterZachary T Swartz, MD In Basket   10/17/2014 3:46 PM Ranelle OysterZachary T Swartz, MD Gordy SaversPeter F Kwiatkowski, MD In Basket

## 2014-10-20 NOTE — Plan of Care (Signed)
Problem: RH BLADDER ELIMINATION Goal: RH STG MANAGE BLADDER WITH ASSISTANCE STG Manage Bladder With Assistance  Outcome: Progressing  Problem: RH PAIN MANAGEMENT Goal: RH STG PAIN MANAGED AT OR BELOW PT'S PAIN GOAL Outcome: Progressing

## 2014-10-20 NOTE — Progress Notes (Signed)
Chaplain responded to spiritual care consult. Pt tired at this time and chaplain suggested visit at a later time. Chaplain will attempt visit later today or tomorrow.    10/20/14 1100  Clinical Encounter Type  Visited With Patient  Visit Type Follow-up;Spiritual support  Referral From Physician  Recommendations Follow Up  Spiritual Encounters  Spiritual Needs Emotional;Prayer  Stress Factors  Patient Stress Factors Major life changes  Bliss Behnke, Mayer MaskerCourtney F, Chaplain 10/20/2014 11:19 AM

## 2014-10-20 NOTE — Progress Notes (Signed)
Social Work Assessment and Plan Social Work Assessment and Plan  Patient Details  Name: Teresa Martinez MRN: [id number] Date of Birth: 04/17/1941  Today's Date: 10/20/2014  Problem List:  Patient Active Problem List   Diagnosis Date Noted  . Right rotator cuff tendonitis 10/19/2014  . Stenosis of cervical spine region   . Chronic right shoulder pain   . Cerebral infarction due to thrombosis of left middle cerebral artery   . Left pontine stroke   . TIA (transient ischemic attack) 10/14/2014  . Hypokalemia 10/14/2014  . Overactive bladder 10/14/2014  . CVA (cerebral infarction) 10/14/2014  . URI 11/26/2010  . NECK PAIN, CHRONIC 10/29/2010  . Essential hypertension 10/10/2009  . INSOMNIA 09/15/2009  . Pure hypercholesterolemia 11/28/2008  . MENOPAUSAL SYNDROME 11/28/2008   Past Medical History:  Past Medical History  Diagnosis Date  . Hyperlipemia   . Statin intolerance   . Menopausal syndrome     [name]([place])  . Hypertension   . Overactive bladder   . Cervical spine degeneration     C5/C6  . Chronic right shoulder pain    Past Surgical History:  Past Surgical History  Procedure Laterality Date  . Cholecystectomy      1950  . Band hemorrhoidectomy      2010  . Parotidectomy      30 years ago  . Abdominal hysterectomy      1985   Social History:  reports that she has never smoked. She has never used smokeless tobacco. She reports that she does not drink alcohol or use illicit drugs.  Family / Support Systems Marital Status: Married Patient Roles: Spouse, Parent, Other (Comment) (Employee) Spouse/Significant OtherFayrene Fearing: James [phone]  [phone] Children: Marijean NiemannCheryl Cheston-daughter  [phone]  [phone]  [phone] Other Supports: Three more children Anticipated Caregiver: Fayrene FearingJames Ability/Limitations of Caregiver: Fayrene FearingJames works out of town most of the week, but plans to take time off to assist pt at home Caregiver Availability: Other  (Comment) (Husband plans to take time off from work) Family Dynamics: Close knit family who are there for one another and involved.  Both are very independent and have continued to work full time after retirment age. Their children are involved but is out of town and two are out of state.   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  Will make sure husband is involved if any decision need to be made while here.   Abuse/Neglect Physical Abuse: Denies Verbal Abuse: Denies Sexual Abuse: Denies Exploitation of patient/patient's resources: Denies Self-Neglect: Denies  Emotional Status Pt's affect, behavior adn adjustment status: Pt is motivated and wants to regain as much function as she can while here.  She is trying to listen and do what the therapists are asking her.  She has always been independent and able to take care of her needs.  She would benefit from Neuro-psych consult due to the major life chagne from this stroke. Recent Psychosocial Issues: Was doing well prior to admission Pyschiatric History: No history deferred depression due to feeling exhausted from therapies.  Will check back-and refer to Neuro-psych to provide support and coping along with Child psychotherapistsocial worker. Substance Abuse History: No  issues  Patient / Family Perceptions, Expectations & Goals Pt/Family understanding of illness & functional limitations: Pt and husband can explain her stroke and deficts, both pleased with the progress they have seen thus far.  Both talk with MD and feel their questions and concerns  are being addressed. Both hopeful she will do well here. Premorbid pt/family roles/activities: Wife, Mother, Grandmother, susbs teache, retired Archivistdetective, church member, Catering manageretc Anticipated changes in roles/activities/participation: resume Pt/family expectations/goals: Pt states: " I want to take care of myself and get my life back."  Husband states: "  I will do what is needed, but am hopeful she will do well here."  [employer] IncCommunity Resources Community Agencies: None Premorbid Home Care/DME Agencies: None Transportation available at discharge: Husband and daughter Resource referrals recommended: Neuropsychology, Support group (specify)  Discharge Planning Living Arrangements: Spouse/significant other Support Systems: Spouse/significant other, Children, Other relatives, Friends/neighbors, Psychologist, clinicalChurch/faith community Type of Residence: Private residence Insurance Resources: Media plannerrivate Insurance (specify) (Humana Medicare) Surveyor, quantityinancial Resources: Employment, Engineer, siteamily Support, Other (Comment) (pension from the police dept) Financial Screen Referred: No Living Expenses: Lives with family Money Management: Spouse, Patient Does the patient have any problems obtaining your medications?: No Home Management: Self Patient/Family Preliminary Plans: Return home with husband taking time off to provide assistance.  Will discuss how long he is able to do this for.  Local daughter is involved and willing to help but works.  Will await teams evaluations and discuss safe discharge plan. Social Work Anticipated Follow Up Needs: HH/OP, Support Group  Clinical Impression Very quiet and tired patient from her am therapies.  She is a Chief Executive Officerhard worker and willing to do what she needs to do to recover from this stroke.  Her family is supportive and committed to caring for her At discharge.  Will work on safe discharge plan.  Pt would benefit from Neuro-psych eval while here for support.  Lucy Chrisupree, Dillyn Menna G 10/20/2014, 2:29 PM

## 2014-10-20 NOTE — Progress Notes (Signed)
Patient stated to Scarlette SliceHannah Mack, RN that she prefers side rails x4 up when she is in bed for safety. She was educated that in hospital use this is considered a restraint. Patient verbalized that she understood however, she still preferred to have all side rails up when in the bed. Notation was made to the safety plan in the patient's room.

## 2014-10-20 NOTE — Progress Notes (Signed)
[facility] Admission Coordinator Signed Physical Medicine and Rehabilitation PMR Pre-admission 10/19/2014 10:05 AM  Related encounter: ED to Hosp-Admission (Discharged) from 10/14/2014 in [place]   PMR Admission Coordinator Pre-Admission Assessment  Patient: Teresa Martinez is an 73 y.o., female MRN: [id number] DOB: 27-Jan-1941 Height: '5\' 6"'  (167.6 cm) Weight: 63.2 kg (139 lb 5.3 oz)  Insurance Information HMO: PPO: yes PCP: IPA: 80/20: OTHER:  PRIMARY: Hum[employer] Medicare Choice Policy#: [id number] Subscriber: self CM Name: Bonnita Hollow, RN Phone#: [phone], ext. [phone] Fax#: [phone] Approval given for seven days from [facility] (phone: [phone], ext. 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 Date: 11-18-2012 Deduct: none Out of Pocket Max: $4000 (met $80.00) Life Max: unlimited CIR: 100%, pre-auth needed SNF: 100% days 1-20; $50/day for days 21-50 (visit limit based on medical necessity, pre-auth needed) Outpatient: 100% Co-Pay: $20, visit limit based on med. necessity Home Health: 100% Co-Pay: none, visit limit based on med. necessity DME: 80% Co-Pay: 20% Providers: in network  Emergency [place]    Name Relation Home Work [place] Spouse [phone]     Current Medical History  Patient Admitting Diagnosis: left pontine infarct  History of Present Illness: Teresa Martinez is a 73 y.o. RH-female with history of HTN,  hyperlipidemia who was admitted on 10/14/14 with acute onset of slurred speech and right sided weakness. Patient with sputtering symptoms and was treated with tPA due to recurrent nature of symptoms. CTA head/neck with 50% stenosis of R-ICA with luminal irregularity of bilateral ICA question fibromuscular dysplasia, severe calcific atherosclerosis of the carotid siphon and mild luminal irregularity of VA with extrinsic compression due to cervical spine degenerative changes. MRI/MRA brain done revealing acute left pontine infarct with lobular mass right parotid gland. CT cervical spine with right parotid mass, right thyroid nodules and cervical spondylosis. CT abdomen/pelvis without significant abnormality. 2D echo with EF 55-60& with no wall abnormality but grade 2 diastolic dysfunction and redundancy of atrial septum with borderline criteria for aneurysm. Patient was started on ASA for secondary stroke prevention. Patient with resultant mild right hemiparesis as well as right lean when fatigue with ambulation. CIR recommended by MD and rehab team.  NIH Total: 3  Past Medical History  Past Medical History  Diagnosis Date  . Hyperlipemia   . Statin intolerance   . Menopausal syndrome     [name] [place])  . Hypertension   . Overactive bladder   . Cervical spine degeneration     C5/C6  . Chronic right shoulder pain     Family History  family history includes Cirrhosis in her sister; Diabetes in her mother; Heart failure in her father; Hyperlipidemia in her brother; Hypertension in her brother and mother; Stroke in her mother.  Prior Rehab/Hospitalizations: none  Current Medications  Current facility-administered medications: 0.9 % sodium chloride infusion, , Intravenous, Continuous, Donzetta Starch, NP, Last Rate: 75 mL/hr at 10/19/14 0037; acetaminophen (TYLENOL) tablet 650 mg, 650 mg, Oral, Q4H PRN **OR** acetaminophen (TYLENOL) suppository 650 mg,  650 mg, Rectal, Q4H PRN, Wallie Char; aspirin tablet 325 mg, 325 mg, Oral, Daily, Wallie Char, 325 mg at 10/19/14 0848 bisacodyl (DULCOLAX) EC tablet 5 mg, 5 mg, Oral, Daily PRN, Antony Contras, MD; enoxaparin (LOVENOX) injection 40 mg, 40 mg, Subcutaneous,  Daily, Donzetta Starch, NP, 40 mg at 10/19/14 0848; labetalol (NORMODYNE,TRANDATE) injection 10 mg, 10 mg, Intravenous, Q10 min PRN, Wallie Char; pantoprazole (PROTONIX) EC tablet 40 mg, 40 mg, Oral, QHS, Antony Contras, MD, 40 mg at 10/18/14 2117 traMADol (ULTRAM) tablet 100 mg, 100 mg, Oral, Q8H PRN, Donzetta Starch, NP, 100 mg at 10/17/14 2243  Patients Current Diet: Diet Heart  Precautions / Restrictions Precautions Precautions: Fall Restrictions Weight Bearing Restrictions: No   Prior Activity Level Community (5-7x/wk): Pt was very active prior to CVA and 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 She enjoys reading mysteries and watching TV.   Home Assistive Devices / Equipment Home Assistive Devices/Equipment: Eyeglasses Home Equipment: Shower seat - built in, Hand held shower head  Prior Functional Level Prior Function Level of Independence: Independent 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  Current Functional Level Cognition  Overall Cognitive Status: Impaired/Different from baseline Current Attention Level: Sustained Orientation Level: Oriented X4 Safety/Judgement: Decreased awareness of safety General Comments: pt appeared    Extremity Assessment (includes Sensation/Coordination)          ADLs  Overall ADL's : Needs assistance/impaired Grooming: Wash/dry hands, Minimal assistance, Standing Grooming Details (indicate cue type and reason): Min (A) to maintain balance due to R lateral lean. Verbal cues provided to stand tall and straighten R knee Toilet Transfer: Minimal assistance, Ambulation, Regular Toilet, Grab bars Toilet Transfer Details  (indicate cue type and reason): Min (A) to stabilize balance Toileting- Clothing Manipulation and Hygiene: Minimal assistance, Sitting/lateral lean Toileting - Clothing Manipulation Details (indicate cue type and reason): Min (A) to stabilize balance Functional mobility during ADLs: Moderate assistance General ADL Comments: 1 person hand-held assist for ambulation. Pt impulsive and demonstrating significant R lateral lean during standing and dynamic balance tasks. Verbal cues throughout session for safety and to compensate for R lean.    Mobility  Overal bed mobility: Needs Assistance Bed Mobility: Sit to Supine Supine to sit: Min guard Sit to supine: Min guard General bed mobility comments: moving quickly and almost struck her head against far bed rail as she lay back    Transfers  Overall transfer level: Needs assistance Equipment used: None Transfers: Sit to/from Stand Sit to Stand: Min assist, +2 safety/equipment General transfer comment: weight shifts to her far Lt    Ambulation / Gait / Stairs / Wheelchair Mobility  Ambulation/Gait Ambulation/Gait assistance: Mod assist, Max assist, +2 safety/equipment Ambulation Distance (Feet): 100 Feet Assistive device: 1 person hand held assist Gait Pattern/deviations: Step-through pattern, Decreased step length - left, Decreased stance time - right, Decreased dorsiflexion - right, Decreased weight shift to left, Narrow base of support Gait velocity: slow General Gait Details: required more assist compared to 11/30 with incr loss of balance to her Rt; slow stepping/placing RLE and shifting forward over Rt foot with hip and knee extension; nearly 100% vc for sequencing    Posture / Balance      Special needs/care consideration BiPAP/CPAP no CPM no  Continuous Drip IV no Dialysis no  Life Vest no  Oxygen no  Special Bed no  Trach Size no  Wound Vac (area) no  Skin - no current  issues  Bowel mgmt: last BM 10-16-14 Bladder mgmt: using bedpan or BSC with assistance Diabetic mgmt no  PT note documented that pt was having some anger responses to her current medical condition and PT/RN were going to get a chaplain referral. Pt was  agreeable to see a chaplain.    Previous Home Environment Living Arrangements: Other (Comment) (Pt's husband works four days/week and is often not at home due to works out of city. Husband states he can take some time off work to be with pt if needed. He is considering other social supports to help if needed but could not share specifics when I asked him about this.) Available Help at Discharge: Other (Comment) (family PRN) Type of Home: House Home Layout: One level Home Access: Stairs to enter Entrance Stairs-Rails: Right, Left Entrance Stairs-Number of Steps: several Bathroom Shower/Tub: Gaffer, Charity fundraiser: [place]: No  Discharge Living Setting Plans for Discharge Living Setting: Patient's home Type of Home at Discharge: House Discharge Home Layout: One level Discharge Home Access: Stairs to enter Entrance Stairs-Rails: Left Entrance Stairs-Number of Steps: 3 (3 steps at back entrance, 5 steps at front door) Does the patient have any problems obtaining your medications?: No  Social/Family/Support Systems Patient Roles: Spouse, Other (Comment) (works as a Oceanographer) Sport and exercise psychologist Information: husband Jeneen Rinks is primary contact Anticipated Caregiver: husband (though husband works). He is figuring out other possible support options and can take some time off work initially if needed. Anticipated Caregiver's Contact Information: see above Ability/Limitations of Caregiver: Husband does work 4 days/week but has some flexibility with his schedule. He can also take some time off work initially to be with pt if needed. Caregiver Availability: Intermittent (see previous  comments about husband's work schedule) Discharge Plan Discussed with Primary Caregiver: Yes Is Caregiver In Agreement with Plan?: Yes Does Caregiver/Family have Issues with Lodging/Transportation while Pt is in Rehab?: No  Goals/Additional Needs Patient/Family Goal for Rehab: Mod Ind and Supervision with PT/OT/SLP Expected length of stay: 7-10 days  Cultural Considerations: none Dietary Needs: heart healthy Equipment Needs: to be determined Pt/Family Agrees to Admission and willing to participate: Yes (met with pt and her husband on 11-30 and 12-1) Program Orientation Provided & Reviewed with Pt/Caregiver Including Roles & Responsibilities: Yes   Decrease burden of Care through IP rehab admission: NA   Possible need for SNF placement upon discharge: not anticipated  Patient Condition: This patient's condition remains as documented in the consult dated 10-17-14, in which the Rehabilitation Physician determined and documented that the patient's condition is appropriate for intensive rehabilitative care in an inpatient rehabilitation facility. Will admit to inpatient rehab today.  Preadmission Screen Completed By: Nanetta Batty, PT, 10/19/2014 12:27 PM ______________________________________________________________________  Discussed status with Dr. Naaman Plummer on 10-19-14 at 1229 and received telephone approval for admission today.  Admission Coordinator: Nanetta Batty, PT, time 1229/Date 10-19-14          Cosigned by: Meredith Staggers, MD at 10/19/2014 1:40 PM  Revision History     Date/Time User Provider Type Action   10/19/2014 1:40 PM Meredith Staggers, MD Physician Cosign   10/19/2014 12:30 PM [facility] Admission Coordinator Sign

## 2014-10-20 NOTE — Progress Notes (Signed)
Chaplain followed up with pt. Chaplain facilitated storytelling, prayed, and read Bible at pt request. Pt seems more hopeful today and is looking forward to out of town daughter coming to visit. Pt would like follow up. Chaplain will continue to follow.   10/20/14 1600  Clinical Encounter Type  Visited With Patient  Visit Type Follow-up;Spiritual support  Spiritual Encounters  Spiritual Needs Emotional;Prayer;Sacred text  Stress Factors  Patient Stress Factors Major life changes;Family relationships  Genie Wenke, Loa SocksCourtney F, Chaplain 10/20/2014 4:41 PM

## 2014-10-20 NOTE — Evaluation (Signed)
Occupational Therapy Assessment and Plan  Patient Details  Name: Teresa Martinez MRN: [id number] Date of Birth: 01-31-1941  OT Diagnosis: abnormal posture, acute pain and hemiplegia affecting dominant side Rehab Potential:  good for stated goals ELOS: 12-14 days   Today's Date: 10/20/2014 OT Individual Time: 0950-1050 OT Individual Time Calculation (min): 60 min     Problem List:  Patient Active Problem List   Diagnosis Date Noted  . Right rotator cuff tendonitis 10/19/2014  . Stenosis of cervical spine region   . Chronic right shoulder pain   . Cerebral infarction due to thrombosis of left middle cerebral artery   . Left pontine stroke   . TIA (transient ischemic attack) 10/14/2014  . Hypokalemia 10/14/2014  . Overactive bladder 10/14/2014  . CVA (cerebral infarction) 10/14/2014  . URI 11/26/2010  . NECK PAIN, CHRONIC 10/29/2010  . Essential hypertension 10/10/2009  . INSOMNIA 09/15/2009  . Pure hypercholesterolemia 11/28/2008  . MENOPAUSAL SYNDROME 11/28/2008    Past Medical History:  Past Medical History  Diagnosis Date  . Hyperlipemia   . Statin intolerance   . Menopausal syndrome     [name] [place])  . Hypertension   . Overactive bladder   . Cervical spine degeneration     C5/C6  . Chronic right shoulder pain    Past Surgical History:  Past Surgical History  Procedure Laterality Date  . Cholecystectomy      1950  . Band hemorrhoidectomy      2010  . Parotidectomy      30 years ago  . Abdominal hysterectomy      1985    Assessment & Plan Clinical Impression: Patient is a 73 y.o. year old female with history of HTN, hyperlipidemia who was admitted on 10/14/14 with acute onset of slurred speech and right sided weakness. Patient with sputtering symptoms and was treated with tPA due to recurrent nature of symptoms. CTA head/neck with 50% stenosis of R-ICA with luminal irregularity of bilateral ICA question fibromuscular dysplasia, severe calcific  atherosclerosis of the carotid siphon and mild luminal irregularity of VA with extrinsic compression due to cervical spine degenerative changes. MRI/MRA brain done revealing acute left pontine infarct with lobular mass right parotid gland. CT cervical spine with cervical spondylosis C5/C6. CT Neck with right parotid mass and two right thyroid nodules without adenopathy. CT abdomen/pelvis without significant abnormality. 2D echo with EF 55-60& with no wall abnormality but grade 2 diastolic dysfunction and redundancy of atrial septum with borderline criteria for aneurysm. Patient was started on ASA for secondary stroke prevention. She had worsening of symptoms on 12/1 and was treated with bedrest as well as IVF. She continues to have significant pain RUE as well as difficulty with RLE control. Patient with resultant right hemiparesis, balance deficits, dysarthria .  Patient transferred to CIR on 10/19/2014 .    Patient currently requires Mod - Max A with basic self-care skills secondary to muscle weakness, decreased coordination and decreased sitting balance, decreased standing balance, decreased postural control, hemiplegia and decreased balance strategies.  Prior to hospitalization, patient could complete ADLs and IADLs  with independent .  Patient will benefit from skilled intervention to increase independence with basic self-care skills prior to discharge home with care partner.  Anticipate patient will require 24 hour supervision and minimal physical assistance( bathing) and HHOT vs outpatient OT services pending pt progress and transportation.    Skilled Therapeutic Intervention Upon entering the room, pt seated in wheelchair awaiting therapist with no c/o pain. OT  educated pt on OT POC, goals, and purpose with pt being agreeable to plan. Pt transferred wheelchair <> TTB with Mod A. Pt displaying R lateral lean in seated position. Very impulsive during transfer and requiring max verbal cues for safety  awareness to lock breaks, wait for instruction, and be aware of where R UE is located. Dressing performed seated in wheelchair at sink side with Mod A - Max A for dynamic standing balance for clothing management. Pt requesting to return to bed secondary to "not getting enough sleep". Pt transferred to bed from wheelchair with Mod A and again with max verbal cues for safety and impulsivity. Pt supine in bed with R UE positioned for support and bed alarm activated upon exiting the room.   OT Evaluation Precautions/Restrictions  Precautions Precautions: Fall Precaution Comments: R hemiparesis, impulsive, R lateral lean Restrictions Weight Bearing Restrictions: No General Chart Reviewed: Yes Pain Pain Assessment Pain Assessment: No/denies pain Pain Score: 0-No pain Pain Type: Chronic pain Pain Location: Shoulder Pain Orientation: Right Pain Descriptors / Indicators: Aching Pain Intervention(s): Medication (See eMAR) Home Living/Prior Functioning Home Living Available Help at Discharge: Family, Available PRN/intermittently (family not available during the day secondary to working) Type of Home: House Home Access: Stairs to enter Technical brewer of Steps: 3-4 Entrance Stairs-Rails: Left Home Layout: One level Additional Comments: shower has built in seat, husband is putting in grab bars, small lip to enter walk in shower  Lives With: Spouse IADL History Homemaking Responsibilities: Yes Meal Prep Responsibility: Primary Laundry Responsibility: Primary Cleaning Responsibility: Primary Current License: Yes Mode of Transportation: Car Occupation: Retired, Part time employment Type of 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, 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 Leisure and Hobbies: jogging and reading mystery novels Prior Function Level of Independence: Independent with basic ADLs, Independent with homemaking with ambulation, Independent with gait  Able to Take  Stairs?: Yes Driving: Yes Vocation: Part time employment 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 Leisure: Hobbies-yes (Comment) Comments: jogging and reading mystery novels Vision/Perception  Vision- History Baseline Vision/History: Wears glasses Wears Glasses: At all times (wears them outside of house) Patient Visual Report: No change from baseline Vision- Assessment Vision Assessment?: No apparent visual deficits  Cognition Overall Cognitive Status: Impaired/Different from baseline Orientation Level: Oriented X4 Attention: Selective;Sustained Sustained Attention: Appears intact Selective Attention: Impaired Selective Attention Impairment: Functional basic Memory: Impaired Memory Impairment: Decreased recall of new information Awareness: Impaired Awareness Impairment: Emergent impairment Problem Solving: Impaired Problem Solving Impairment: Functional basic Behaviors: Impulsive Comments: Pt able to state deficits, however is very impulsive with functional transfers Sensation Sensation Light Touch: Appears Intact Stereognosis: Not tested Hot/Cold: Appears Intact Proprioception: Appears Intact Coordination Gross Motor Movements are Fluid and Coordinated: No Fine Motor Movements are Fluid and Coordinated: No Coordination and Movement Description: decreased strength and AROM in R UE with limited movemenet in shoulder and trace in elbow.  Motor  Motor Motor: Hemiplegia;Abnormal postural alignment and control Motor - Skilled Clinical Observations: Pt with R hemiplegia, decreased postural and trunk control, R lateral lean in standing Mobility  Bed Mobility Bed Mobility: Supine to Sit;Sit to Supine Supine to Sit: 5: Supervision;HOB flat Supine to Sit Details: Tactile cues for sequencing;Verbal cues for precautions/safety;Verbal cues for sequencing Sit to Supine: 5: Supervision;HOB flat Sit to Supine - Details: Verbal cues for sequencing;Verbal cues for  precautions/safety Sit to Supine - Details (indicate cue type and reason): Performed on therapy mat with HOB flat and without rails to better simulate  home environment.  cues for attending to [place].  Transfers Sit to Stand: 4: Min assist Sit to Stand Details: Visual cues/gestures for sequencing;Verbal cues for sequencing;Verbal cues for technique;Verbal cues for precautions/safety;Manual facilitation for weight shifting;Manual facilitation for weight bearing Stand to Sit: 4: Min assist Stand to Sit Details (indicate cue type and reason): Verbal cues for sequencing;Verbal cues for technique;Verbal cues for precautions/safety;Manual facilitation for weight shifting;Manual facilitation for weight bearing  Trunk/Postural Assessment  Cervical Assessment Cervical Assessment: Within Functional Limits Thoracic Assessment Thoracic Assessment: Within Functional Limits Lumbar Assessment Lumbar Assessment: Within Functional Limits Postural Control Postural Control: Deficits on evaluation Protective Responses: Pt with very delayed protective response in sitting with continous LOB to the R Postural Limitations: R lateral lean when standing for functional task as well as while seated on TTB  Balance Balance Balance Assessed: Yes Dynamic Standing Balance Dynamic Standing - Balance Support: During functional activity Dynamic Standing - Level of Assistance: 3: Mod assist Dynamic Standing - Balance Activities: Other (comment) Dynamic Standing - Comments: standing at sink for clothing management  Extremity/Trunk Assessment RUE Assessment RUE Assessment: Exceptions to [facility] RUE AROM (degrees) RUE Overall AROM Comments: pt able to shrug shoulder for scapular elevation and trace movement for elbow flexion/ext RUE PROM (degrees) RUE Overall PROM Comments: decreased PROM secondary to pain for shoulder LUE Assessment LUE Assessment: Within Functional Limits  FIM:  FIM - Grooming Grooming Steps: Wash, rinse,  dry face;Wash, rinse, dry hands;Oral care, brush teeth, clean dentures Grooming: 5: Set-up assist to open containers FIM - Bathing Bathing Steps Patient Completed: Chest;Right Arm;Abdomen;Front perineal area;Right upper leg;Left upper leg Bathing: 3: Mod-Patient completes 5-7 16f10 parts or 50-74% FIM - Upper Body Dressing/Undressing Upper body dressing/undressing steps patient completed: Thread/unthread right bra strap;Thread/unthread left bra strap;Thread/unthread left sleeve of pullover shirt/dress;Put head through opening of pull over shirt/dress Upper body dressing/undressing: 3: Mod-Patient completed 50-74% of tasks FIM - Lower Body Dressing/Undressing Lower body dressing/undressing steps patient completed: Thread/unthread left underwear leg;Thread/unthread left pants leg;Pull underwear up/down Lower body dressing/undressing: 2: Max-Patient completed 25-49% of tasks FIM - BControl and instrumentation engineerDevices: Arm rests Bed/Chair Transfer: 5: Supine > Sit: Supervision (verbal cues/safety issues);5: Sit > Supine: Supervision (verbal cues/safety issues);3: Bed > Chair or W/C: Mod A (lift or lower assist);3: Chair or W/C > Bed: Mod A (lift or lower assist) FIM - Tub/Shower Transfers Tub/Shower Assistive Devices: Tub transfer bench;Grab bars;Walk in shower Tub/shower Transfers: 3-Into Tub/Shower: Mod A (lift or lower/lift 2 legs);3-Out of Tub/Shower: Mod A (lift or lower/lift 2 legs)   Refer to Care Plan for Long Term Goals  Recommendations for other services: Neuropsych and Other: Recreational therapy  Discharge Criteria: Patient will be discharged from OT if patient refuses treatment 3 consecutive times without medical reason, if treatment goals not met, if there is a change in medical status, if patient makes no progress towards goals or if patient is discharged from hospital.  The above assessment, treatment plan, treatment alternatives and goals were discussed and  mutually agreed upon: by patient  PPhineas Semen12/01/2014, 9:54 AM

## 2014-10-20 NOTE — Plan of Care (Signed)
Problem: RH SKIN INTEGRITY Goal: RH STG SKIN FREE OF INFECTION/BREAKDOWN Outcome: Progressing  Problem: RH SAFETY Goal: RH STG DECREASED RISK OF FALL WITH ASSISTANCE STG Decreased Risk of Fall With Assistance. Outcome: Progressing

## 2014-10-20 NOTE — Progress Notes (Signed)
Patient information reviewed and entered into eRehab system by Jasman Pfeifle, RN, CRRN, PPS Coordinator.  Information including medical coding and functional independence measure will be reviewed and updated through discharge.     Per nursing patient was given "Data Collection Information Summary for Patients in Inpatient Rehabilitation Facilities with attached "Privacy Act Statement-Health Care Records" upon admission.  

## 2014-10-20 NOTE — Plan of Care (Signed)
Problem: RH SAFETY Goal: RH STG ADHERE TO SAFETY PRECAUTIONS W/ASSISTANCE/DEVICE STG Adhere to Safety Precautions With Assistance/Device.  Outcome: Progressing     

## 2014-10-20 NOTE — Progress Notes (Signed)
73 y.o. RH-female with history of HTN, hyperlipidemia who was admitted on 10/14/14 with acute onset of slurred speech and right sided weakness. Patient with sputtering symptoms and was treated with tPA due to recurrent nature of symptoms. CTA head/neck with 50% stenosis of R-ICA with luminal irregularity of bilateral ICA question fibromuscular dysplasia, severe calcific atherosclerosis of the carotid siphon and mild luminal irregularity of VA with extrinsic compression due to cervical spine degenerative changes. MRI/MRA brain done revealing acute left pontine infarct with lobular mass right parotid gland  Subjective/Complaints: RIght shoulder pain, injured it during a fall in July  Objective: Vital Signs: Blood pressure 125/64, pulse 66, temperature 97.9 F (36.6 C), temperature source Oral, resp. rate 17, height '5\' 6"'  (1.676 m), weight 64.4 kg (141 lb 15.6 oz), SpO2 99 %. Dg Shoulder Right  10/19/2014   CLINICAL DATA:  Right shoulder pain following a stroke 5 days ago.  EXAM: RIGHT SHOULDER - 2+ VIEW  COMPARISON:  11/202/2006  FINDINGS: The joint spaces are maintained. No acute bony findings or significant degenerative changes. No abnormal soft tissue calcifications.  IMPRESSION: No acute bony findings.   Electronically Signed   By: Kalman Jewels M.D.   On: 10/19/2014 19:05   Results for orders placed or performed during the hospital encounter of 10/19/14 (from the past 72 hour(s))  CBC WITH DIFFERENTIAL     Status: Abnormal   Collection Time: 10/20/14  4:31 AM  Result Value Ref Range   WBC 6.3 4.0 - 10.5 K/uL   RBC 4.88 3.87 - 5.11 MIL/uL   Hemoglobin 12.6 12.0 - 15.0 g/dL   HCT 39.2 36.0 - 46.0 %   MCV 80.3 78.0 - 100.0 fL   MCH 25.8 (L) 26.0 - 34.0 pg   MCHC 32.1 30.0 - 36.0 g/dL   RDW 14.4 11.5 - 15.5 %   Platelets 224 150 - 400 K/uL   Neutrophils Relative % 51 43 - 77 %   Neutro Abs 3.2 1.7 - 7.7 K/uL   Lymphocytes Relative 36 12 - 46 %   Lymphs Abs 2.3 0.7 - 4.0 K/uL   Monocytes  Relative 8 3 - 12 %   Monocytes Absolute 0.5 0.1 - 1.0 K/uL   Eosinophils Relative 5 0 - 5 %   Eosinophils Absolute 0.3 0.0 - 0.7 K/uL   Basophils Relative 0 0 - 1 %   Basophils Absolute 0.0 0.0 - 0.1 K/uL  Comprehensive metabolic panel     Status: Abnormal   Collection Time: 10/20/14  4:31 AM  Result Value Ref Range   Sodium 144 137 - 147 mEq/L   Potassium 4.0 3.7 - 5.3 mEq/L   Chloride 107 96 - 112 mEq/L   CO2 23 19 - 32 mEq/L   Glucose, Bld 89 70 - 99 mg/dL   BUN 17 6 - 23 mg/dL   Creatinine, Ser 0.82 0.50 - 1.10 mg/dL   Calcium 9.2 8.4 - 10.5 mg/dL   Total Protein 6.3 6.0 - 8.3 g/dL   Albumin 3.3 (L) 3.5 - 5.2 g/dL   AST 16 0 - 37 U/L   ALT 12 0 - 35 U/L   Alkaline Phosphatase 48 39 - 117 U/L   Total Bilirubin 0.2 (L) 0.3 - 1.2 mg/dL   GFR calc non Af Amer 68 (L) >90 mL/min   GFR calc Af Amer 79 (L) >90 mL/min    Comment: (NOTE) The eGFR has been calculated using the CKD EPI equation. This calculation has not been validated  in all clinical situations. eGFR's persistently <90 mL/min signify possible Chronic Kidney Disease.    Anion gap 14 5 - 15     HEENT: fair dentition Cardio: RRR and no murmur Resp: CTA B/L and unlabored GI: BS positive and NT, ND Extremity:  Pulses positive and No Edema Skin:   Intact Neuro: Alert/Oriented, Cranial Nerve Abnormalities Right central 7, Normal Sensory, Abnormal Motor 0/5 Right hand/wrist, 2- delt, bi,tri and Dysarthric Musc/Skel:  Other tenderness to palpation RIght shoulder, pain with ROM, +impingegement R shoulder, no pain in R elbow ,wrist or hand Gen NAD   Assessment/Plan: 1. Functional deficits secondary to Left pontine infarct causing R hemiparesis which require 3+ hours per day of interdisciplinary therapy in a comprehensive inpatient rehab setting. Physiatrist is providing close team supervision and 24 hour management of active medical problems listed below. Physiatrist and rehab team continue to assess barriers to  discharge/monitor patient progress toward functional and medical goals. FIM:                   Comprehension Comprehension Mode: Auditory Comprehension: 5-Follows basic conversation/direction: With no assist  Expression Expression Mode: Verbal Expression: 5-Expresses basic 90% of the time/requires cueing < 10% of the time.  Social Interaction Social Interaction: 5-Interacts appropriately 90% of the time - Needs monitoring or encouragement for participation or interaction.  Problem Solving Problem Solving: 5-Solves basic 90% of the time/requires cueing < 10% of the time  Memory Memory: 5-Recognizes or recalls 90% of the time/requires cueing < 10% of the time  Medical Problem List and Plan: 1. Functional deficits secondary to Left pontine infarct.   2.  DVT Prophylaxis/Anticoagulation: Pharmaceutical: Lovenox 3. Pain Management: Oxycodone prn for right shoulder pain. Ice as well as elevation for symptom management.   4. Mood: Reports of anxiety with adjustment reaction. Team to provide ego support. LCSW to follow for evaluation and support. Will consult chaplain services for follow up visit.   5. Neuropsych: This patient is capable of making decisions on her own behalf. 6. Skin/Wound Care: Routine pressure relief measures.   7. Fluids/Electrolytes/Nutrition: Monitor I/O. Offer nutritonal supplements as needed if intake poor. Husband encouraged to bring food from home.   8. HTN: Monitor every 8 hours. Off prinizide--avoid hypotension to allow adequate perfusion.   9. Right shoulder pain: likely due to RTC pathology: Negative X rays , voltaren gel, KPad -would like for PT to address as well 10. Right Thyroid nodules/Parotid nodule: Follow up with ENT on outpatient basis 11. Hyperlipidemia: unable to tolerate statins.    LOS (Days) 1 A FACE TO FACE EVALUATION WAS PERFORMED  KIRSTEINS,ANDREW E 10/20/2014, 8:38 AM

## 2014-10-20 NOTE — Evaluation (Signed)
Physical Therapy Assessment and Plan  Patient Details  Name: Teresa Martinez MRN: [id number] Date of Birth: August 13, 1941  PT Diagnosis: Abnormal posture, Abnormality of gait, Cognitive deficits, Coordination disorder, Difficulty walking, Hemiparesis dominant and Pain in R shoulder Rehab Potential: Good ELOS: 12-14 days    Today's Date: 10/20/2014 PT Individual Time: 0830-0930 PT Individual Time Calculation (min): 60 min    Problem List:  Patient Active Problem List   Diagnosis Date Noted  . Right rotator cuff tendonitis 10/19/2014  . Stenosis of cervical spine region   . Chronic right shoulder pain   . Cerebral infarction due to thrombosis of left middle cerebral artery   . Left pontine stroke   . TIA (transient ischemic attack) 10/14/2014  . Hypokalemia 10/14/2014  . Overactive bladder 10/14/2014  . CVA (cerebral infarction) 10/14/2014  . URI 11/26/2010  . NECK PAIN, CHRONIC 10/29/2010  . Essential hypertension 10/10/2009  . INSOMNIA 09/15/2009  . Pure hypercholesterolemia 11/28/2008  . MENOPAUSAL SYNDROME 11/28/2008    Past Medical History:  Past Medical History  Diagnosis Date  . Hyperlipemia   . Statin intolerance   . Menopausal syndrome     [name] [place])  . Hypertension   . Overactive bladder   . Cervical spine degeneration     C5/C6  . Chronic right shoulder pain    Past Surgical History:  Past Surgical History  Procedure Laterality Date  . Cholecystectomy      1950  . Band hemorrhoidectomy      2010  . Parotidectomy      30 years ago  . Abdominal hysterectomy      1985    Assessment & Plan Clinical Impression: Patient is a 73 y.o. year old female with history of HTN, hyperlipidemia who was admitted on 10/14/14 with acute onset of slurred speech and right sided weakness. Patient with sputtering symptoms and was treated with tPA due to recurrent nature of symptoms. CTA head/neck with 50% stenosis of R-ICA with luminal irregularity of  bilateral ICA question fibromuscular dysplasia, severe calcific atherosclerosis of the carotid siphon and mild luminal irregularity of VA with extrinsic compression due to cervical spine degenerative changes. MRI/MRA brain done revealing acute left pontine infarct with lobular mass right parotid gland. CT cervical spine with cervical spondylosis C5/C6. CT Neck with right parotid mass and two right thyroid nodules without adenopathy. CT abdomen/pelvis without significant abnormality. 2D echo with EF 55-60& with no wall abnormality but grade 2 diastolic dysfunction and redundancy of atrial septum with borderline criteria for aneurysm. Patient was started on ASA for secondary stroke prevention. She had worsening of symptoms on 12/1 and was treated with bedrest as well as IVF. She continues to have significant pain RUE as well as difficulty with RLE control. Patient with resultant right hemiparesis, balance deficits, dysarthria. CIR was recommended by MD and rehab team and patient admitted today.  Patient transferred to CIR on 10/19/2014 .   Patient currently requires mod with mobility secondary to muscle weakness, impaired timing and sequencing and decreased coordination and decreased attention, decreased awareness, decreased problem solving, decreased safety awareness and decreased memory.  Prior to hospitalization, patient was independent  with mobility and lived with Spouse in a House home.  Home access is 3-4Stairs to enter.  Patient will benefit from skilled PT intervention to maximize safe functional mobility, minimize fall risk and decrease caregiver burden for planned discharge home with 24 hour supervision.  Anticipate patient will benefit from follow up [place] at discharge.  PT -  End of Session Activity Tolerance: Endurance does not limit participation in activity Endurance Deficit: No PT Assessment Rehab Potential (ACUTE/IP ONLY): Good Barriers to Discharge: Decreased caregiver support PT Patient  demonstrates impairments in the following area(s): Balance;Motor;Pain;Safety PT Transfers Functional Problem(s): Bed Mobility;Bed to Chair;Car;Furniture;Floor PT Locomotion Functional Problem(s): Ambulation;Wheelchair Mobility;Stairs PT Plan PT Intensity: Minimum of 1-2 x/day ,45 to 90 minutes PT Frequency: 5 out of 7 days PT Duration Estimated Length of Stay: 12-14 days  PT Treatment/Interventions: Ambulation/gait training;Balance/vestibular training;Cognitive remediation/compensation;Discharge planning;Disease management/prevention;DME/adaptive equipment instruction;Functional electrical stimulation;Functional mobility training;Neuromuscular re-education;Pain management;Patient/family education;Psychosocial support;Splinting/orthotics;Stair training;Therapeutic Activities;Therapeutic Exercise;UE/LE Strength taining/ROM;UE/LE Coordination activities;Wheelchair propulsion/positioning PT Transfers Anticipated Outcome(s): S PT Locomotion Anticipated Outcome(s): S PT Recommendation Recommendations for Other Services: Neuropsych consult Follow Up Recommendations: Home health PT;Outpatient PT Patient destination: Home Equipment Recommended: To be determined  Skilled Therapeutic Intervention PT treatment and assessment completed, see full details below.  Initiated gait training without AD to further challenge balance and WB through RLE.  Pt tends to lose balance to the R and forward, as she is very impulsive and her body tends to go ahead of her legs during gait.  Pt very impulsive with standing and transfers as well.   Discussed ELOS, expected outcomes, recommendations for 24/7 S at DC and equipment needs.  Pt verbalized understanding.  Pt left in w/c in room with half lap tray and with quick release belt donned.    PT Evaluation Precautions/Restrictions Precautions Precautions: Fall Precaution Comments: R hemiparesis, impulsive, R lateral lean Restrictions Weight Bearing Restrictions:  No General   Vital Signs  Pain Pain Assessment Pain Assessment: No/denies pain Pain Score: 0-No pain Home Living/Prior Functioning Home Living Available Help at Discharge: Family;Available PRN/intermittently (family not available during the day secondary to working) Type of Home: House Home Access: Stairs to enter Technical brewer of Steps: 3-4 Entrance Stairs-Rails: Left Home Layout: One level Additional Comments: shower has built in seat, husband is putting in grab bars, small lip to enter walk in shower  Lives With: Spouse Prior Function Level of Independence: Independent with basic ADLs;Independent with homemaking with ambulation;Independent with gait  Able to Take Stairs?: Yes Driving: Yes Vocation: Part time employment 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 Leisure: Hobbies-yes (Comment) Comments: jogging and reading mystery novels Vision/Perception   See OT note Cognition Overall Cognitive Status: Impaired/Different from baseline Orientation Level: Oriented X4 Attention: Selective;Sustained Sustained Attention: Appears intact Selective Attention: Impaired Selective Attention Impairment: Functional basic Memory: Impaired Memory Impairment: Decreased recall of new information Awareness: Impaired Awareness Impairment: Emergent impairment Problem Solving: Impaired Problem Solving Impairment: Functional basic Behaviors: Impulsive Comments: Pt able to state deficits, however is very impulsive with functional transfers Sensation Sensation Light Touch: Appears Intact Stereognosis: Not tested Hot/Cold: Appears Intact Proprioception: Appears Intact Coordination Gross Motor Movements are Fluid and Coordinated: No Fine Motor Movements are Fluid and Coordinated: No Coordination and Movement Description: decreased strength and AROM in R UE with limited movemenet in shoulder and trace in elbow.  Motor  Motor Motor: Hemiplegia;Abnormal postural  alignment and control Motor - Skilled Clinical Observations: Pt with R hemiplegia, decreased postural and trunk control, R lateral lean in standing  Mobility Bed Mobility Bed Mobility: Supine to Sit;Sit to Supine Supine to Sit: 5: Supervision;HOB flat Supine to Sit Details: Tactile cues for sequencing;Verbal cues for precautions/safety;Verbal cues for sequencing Sit to Supine: 5: Supervision;HOB flat Sit to Supine - Details: Verbal cues for sequencing;Verbal cues for precautions/safety Sit to Supine - Details (indicate cue type and reason): Performed  on therapy mat with HOB flat and without rails to better simulate home environment.  cues for attending to [place].  Transfers Transfers: Yes Sit to Stand: 4: Min assist Sit to Stand Details: Visual cues/gestures for sequencing;Verbal cues for sequencing;Verbal cues for technique;Verbal cues for precautions/safety;Manual facilitation for weight shifting;Manual facilitation for weight bearing Stand to Sit: 4: Min assist Stand to Sit Details (indicate cue type and reason): Verbal cues for sequencing;Verbal cues for technique;Verbal cues for precautions/safety;Manual facilitation for weight shifting;Manual facilitation for weight bearing Stand Pivot Transfers: 3: Mod assist;With armrests Stand Pivot Transfer Details: Verbal cues for sequencing;Verbal cues for technique;Verbal cues for precautions/safety;Manual facilitation for weight shifting;Manual facilitation for weight bearing Stand Pivot Transfer Details (indicate cue type and reason): Pt able to stand with min A, however due to impulsivity and decreased strength in RLE, requires mod A during transfer to assist with weight shift and weight bearing through RLE Locomotion  Ambulation Ambulation: Yes Ambulation/Gait Assistance: 3: Mod assist Ambulation Distance (Feet): 30 Feet (then another 10' x 3 for TUG) Assistive device: None Ambulation/Gait Assistance Details: Verbal cues for sequencing;Verbal  cues for technique;Verbal cues for precautions/safety;Manual facilitation for weight shifting;Manual facilitation for placement;Verbal cues for gait pattern;Manual facilitation for weight bearing Ambulation/Gait Assistance Details: Requires assist for increased weight shift to the L to better clear RLE.  Also facilitation for increased weight shift forward and over RLE with cues for increased quad and glute activation.  Gait Gait: Yes Gait Pattern: Impaired Gait Pattern: Step-to pattern;Step-through pattern;Decreased step length - left;Decreased stance time - right;Decreased stride length;Decreased dorsiflexion - right;Decreased weight shift to left;Right steppage;Trunk rotated posteriorly on right;Poor foot clearance - right;Narrow base of support;Trunk flexed Stairs / Additional Locomotion Stairs: Yes Stairs Assistance: 3: Mod assist Stairs Assistance Details: Visual cues/gestures for sequencing;Verbal cues for sequencing;Verbal cues for technique;Verbal cues for precautions/safety;Manual facilitation for weight shifting;Verbal cues for gait pattern;Manual facilitation for weight bearing Stair Management Technique: One rail Left;Step to pattern;Forwards Number of Stairs: 5 Height of Stairs: 4 Architect: Yes Wheelchair Assistance: 5: Investment banker, operational Details: Verbal cues for sequencing;Verbal cues for technique;Verbal cues for precautions/safety Wheelchair Propulsion: Left upper extremity;Left lower extremity Wheelchair Parts Management: Needs assistance;Supervision/cueing Distance: 150  Trunk/Postural Assessment  Cervical Assessment Cervical Assessment: Within Functional Limits Thoracic Assessment Thoracic Assessment: Within Functional Limits Lumbar Assessment Lumbar Assessment: Within Functional Limits Postural Control Postural Control: Deficits on evaluation Protective Responses: Pt with very delayed protective response in sitting with  continous LOB to the R Postural Limitations: R lateral lean when standing for functional task as well as while seated on TTB  Balance Balance Balance Assessed: Yes Standardized Balance Assessment Standardized Balance Assessment: Timed Up and Go Test Timed Up and Go Test TUG: Normal TUG Normal TUG (seconds): 1.22 (avg of 3 reps, requires mod A) Dynamic Standing Balance Dynamic Standing - Balance Support: During functional activity Dynamic Standing - Level of Assistance: 3: Mod assist Dynamic Standing - Balance Activities: Other (comment) Dynamic Standing - Comments: standing at sink for clothing management  Extremity Assessment  RUE Assessment RUE Assessment: Exceptions to [facility] RUE AROM (degrees) RUE Overall AROM Comments: pt able to shrug shoulder for scapular elevation and trace movement for elbow flexion/ext RUE PROM (degrees) RUE Overall PROM Comments: decreased PROM secondary to pain for shoulder LUE Assessment LUE Assessment: Within Functional Limits RLE Assessment RLE Assessment: Exceptions to [facility] RLE Strength RLE Overall Strength: Deficits Right Hip Flexion: 3-/5 Right Hip Extension: 3-/5 Right Knee Flexion: 2/5 Right Knee Extension: 2+/5  Right Ankle Dorsiflexion: 3-/5 Right Ankle Plantar Flexion: 3-/5 LLE Assessment LLE Assessment: Within Functional Limits  FIM:  FIM - Control and instrumentation engineer Devices: Arm rests Bed/Chair Transfer: 5: Supine > Sit: Supervision (verbal cues/safety issues);5: Sit > Supine: Supervision (verbal cues/safety issues);3: Bed > Chair or W/C: Mod A (lift or lower assist);3: Chair or W/C > Bed: Mod A (lift or lower assist) FIM - Locomotion: Wheelchair Distance: 150 Locomotion: Wheelchair: 5: Travels 150 ft or more: maneuvers on rugs and over door sills with supervision, cueing or coaxing FIM - Locomotion: Ambulation Locomotion: Ambulation Assistive Devices: Other (comment) (No AD, assisted at trunk and under R  axilla) Ambulation/Gait Assistance: 3: Mod assist Locomotion: Ambulation: 1: Travels less than 50 ft with moderate assistance (Pt: 50 - 74%) FIM - Locomotion: Stairs Locomotion: Scientist, physiological: Hand rail - 1 Locomotion: Stairs: 2: Up and Down 4 - 11 stairs with moderate assistance (Pt: 50 - 74%)   Refer to Care Plan for Long Term Goals  Recommendations for other services: Neuropsych  Discharge Criteria: Patient will be discharged from PT if patient refuses treatment 3 consecutive times without medical reason, if treatment goals not met, if there is a change in medical status, if patient makes no progress towards goals or if patient is discharged from hospital.  The above assessment, treatment plan, treatment alternatives and goals were discussed and mutually agreed upon: by patient  Denice Bors 10/20/2014, 12:16 PM

## 2014-10-20 NOTE — Evaluation (Signed)
Speech Language Pathology Assessment and Plan  Patient Details  Name: Teresa Martinez MRN: [id number] Date of Birth: December 24, 1940  SLP Diagnosis: Dysarthria  Rehab Potential: Good ELOS: 12-14 days    Today's Date: 10/20/2014 SLP Individual Time: 4098-1191 SLP Individual Time Calculation (min): 60 min   Problem List:  Patient Active Problem List   Diagnosis Date Noted  . Right rotator cuff tendonitis 10/19/2014  . Stenosis of cervical spine region   . Chronic right shoulder pain   . Cerebral infarction due to thrombosis of left middle cerebral artery   . Left pontine stroke   . TIA (transient ischemic attack) 10/14/2014  . Hypokalemia 10/14/2014  . Overactive bladder 10/14/2014  . CVA (cerebral infarction) 10/14/2014  . URI 11/26/2010  . NECK PAIN, CHRONIC 10/29/2010  . Essential hypertension 10/10/2009  . INSOMNIA 09/15/2009  . Pure hypercholesterolemia 11/28/2008  . MENOPAUSAL SYNDROME 11/28/2008   Past Medical History:  Past Medical History  Diagnosis Date  . Hyperlipemia   . Statin intolerance   . Menopausal syndrome     [name] [place])  . Hypertension   . Overactive bladder   . Cervical spine degeneration     C5/C6  . Chronic right shoulder pain    Past Surgical History:  Past Surgical History  Procedure Laterality Date  . Cholecystectomy      1950  . Band hemorrhoidectomy      2010  . Parotidectomy      30 years ago  . Abdominal hysterectomy      1985    Assessment / Plan / Recommendation Clinical Impression Teresa Martinez is a 73 year old right handed female with history of HTN, hyperlipidemia who was admitted on 10/14/14 with acute onset of slurred speech and right sided weakness. Patient was treated with tPA due to recurrent nature of symptoms. MRI/MRA brain done revealing acute left pontine infarct.  Patient was started on ASA for secondary stroke prevention. She had worsening of symptoms on 12/1 and was treated with bedrest as  well as IVF. She continues to have significant pain RUE as well as difficulty with RLE control. Patient with resultant right hemiparesis, balance deficits, dysarthria.  CIR was recommended by MD and rehab team and patient admitted 10/20/14.   Orders received; cognitive-linguistic evaluation complete.  Patient demonstrates mild dysarthria marked by low vocal intensity, hoarse vocal quality, and imprecise consonant productions, resulting in decreased speech intelligibility. Patient also reported difficulty retrieving new information as well as words intermittently and was observed to be occasionally impulsive; while this may be baseline patient would also benefit from education to maximize safety prior to discharge.  as a result, skilled SLP services are warranted to address deficits and maximize overall functional independence prior to discharge home.    Skilled Therapeutic Interventions          Cognitive-linguistic evaluation completed with results and recommendations reviewed with patient.SLP initiated education and cuing; patient was able to increase her overall intelligibility with Min cues from SLP for increased breath support, vocal intensity, and over articulation at the sentence level of verbal expression.         SLP Assessment  Patient will need skilled [place] Pathology Services during CIR admission    Recommendations  Oral Care Recommendations: Oral care BID Patient destination: Home Follow up Recommendations: Other (comment) (TBD) Equipment Recommended: None recommended by SLP    SLP Frequency 5 out of 7 days   SLP Treatment/Interventions Cueing hierarchy;Environmental controls;Functional tasks;Internal/external aids;Patient/family education;Speech/Language facilitation  Pain Pain Assessment Pain Assessment: No/denies pain Prior Functioning Cognitive/Linguistic Baseline: Within functional limits Type of Home: House  Lives With: Spouse Available Help at Discharge:  Family;Available PRN/intermittently;Friend(s) Vocation: Part time employment  Short Term Goals: Week 1: SLP Short Term Goal 1 (Week 1): Patient will produce speech at the sentence level with Min verbal cues to utilize intelligibility strategies SLP Short Term Goal 2 (Week 1): Patient will verbalize safety precautions with Supervision level question cues SLP Short Term Goal 3 (Week 1): Patient will utilize retrieval strategies with new information and during moments of reported anomia with Min verbal cues  See FIM for current functional status Refer to Care Plan for Long Term Goals  Recommendations for other services: None  Discharge Criteria: Patient will be discharged from SLP if patient refuses treatment 3 consecutive times without medical reason, if treatment goals not met, if there is a change in medical status, if patient makes no progress towards goals or if patient is discharged from hospital.  The above assessment, treatment plan, treatment alternatives and goals were discussed and mutually agreed upon: by patient  Gunnar Fusi, M.A., CCC-SLP [phone]  [place] 10/20/2014, 4:34 PM

## 2014-10-21 ENCOUNTER — Inpatient Hospital Stay (HOSPITAL_COMMUNITY): Payer: Medicare PPO | Admitting: Speech Pathology

## 2014-10-21 ENCOUNTER — Inpatient Hospital Stay (HOSPITAL_COMMUNITY): Payer: Medicare PPO | Admitting: Physical Therapy

## 2014-10-21 ENCOUNTER — Inpatient Hospital Stay (HOSPITAL_COMMUNITY): Payer: Medicare PPO | Admitting: Occupational Therapy

## 2014-10-21 NOTE — Progress Notes (Signed)
73 y.o. RH-female with history of HTN, hyperlipidemia who was admitted on 10/14/14 with acute onset of slurred speech and right sided weakness. Patient with sputtering symptoms and was treated with tPA due to recurrent nature of symptoms. CTA head/neck with 50% stenosis of R-ICA with luminal irregularity of bilateral ICA question fibromuscular dysplasia, severe calcific atherosclerosis of the carotid siphon and mild luminal irregularity of VA with extrinsic compression due to cervical spine degenerative changes. MRI/MRA brain done revealing acute left pontine infarct with lobular mass right parotid gland  Subjective/Complaints: RIght shoulder pain again last night. Had difficult sleeping as a result. Nothing really helping much  Objective: Vital Signs: Blood pressure 137/52, pulse 54, temperature 98 F (36.7 C), temperature source Oral, resp. rate 19, height _0  (1.676 m), weight 64.4 kg (141 lb 15.6 oz), SpO2 97 %. Dg Shoulder Right  10/19/2014   CLINICAL DATA:  Right shoulder pain following a stroke 5 days ago.  EXAM: RIGHT SHOULDER - 2+ VIEW  COMPARISON:  11/202/2006  FINDINGS: The joint spaces are maintained. No acute bony findings or significant degenerative changes. No abnormal soft tissue calcifications.  IMPRESSION: No acute bony findings.   Electronically Signed   By: Kalman Jewels M.D.   On: 10/19/2014 19:05   Results for orders placed or performed during the hospital encounter of 10/19/14 (from the past 72 hour(s))  CBC WITH DIFFERENTIAL     Status: Abnormal   Collection Time: 10/20/14  4:31 AM  Result Value Ref Range   WBC 6.3 4.0 - 10.5 K/uL   RBC 4.88 3.87 - 5.11 MIL/uL   Hemoglobin 12.6 12.0 - 15.0 g/dL   HCT 39.2 36.0 - 46.0 %   MCV 80.3 78.0 - 100.0 fL   MCH 25.8 (L) 26.0 - 34.0 pg   MCHC 32.1 30.0 - 36.0 g/dL   RDW 14.4 11.5 - 15.5 %   Platelets 224 150 - 400 K/uL   Neutrophils Relative % 51 43 - 77 %   Neutro Abs 3.2 1.7 - 7.7 K/uL   Lymphocytes Relative 36 12 - 46 %    Lymphs Abs 2.3 0.7 - 4.0 K/uL   Monocytes Relative 8 3 - 12 %   Monocytes Absolute 0.5 0.1 - 1.0 K/uL   Eosinophils Relative 5 0 - 5 %   Eosinophils Absolute 0.3 0.0 - 0.7 K/uL   Basophils Relative 0 0 - 1 %   Basophils Absolute 0.0 0.0 - 0.1 K/uL  Comprehensive metabolic panel     Status: Abnormal   Collection Time: 10/20/14  4:31 AM  Result Value Ref Range   Sodium 144 137 - 147 mEq/L   Potassium 4.0 3.7 - 5.3 mEq/L   Chloride 107 96 - 112 mEq/L   CO2 23 19 - 32 mEq/L   Glucose, Bld 89 70 - 99 mg/dL   BUN 17 6 - 23 mg/dL   Creatinine, Ser 0.82 0.50 - 1.10 mg/dL   Calcium 9.2 8.4 - 10.5 mg/dL   Total Protein 6.3 6.0 - 8.3 g/dL   Albumin 3.3 (L) 3.5 - 5.2 g/dL   AST 16 0 - 37 U/L   ALT 12 0 - 35 U/L   Alkaline Phosphatase 48 39 - 117 U/L   Total Bilirubin 0.2 (L) 0.3 - 1.2 mg/dL   GFR calc non Af Amer 68 (L) >90 mL/min   GFR calc Af Amer 79 (L) >90 mL/min    Comment: (NOTE) The eGFR has been calculated using the CKD EPI equation.  This calculation has not been validated in all clinical situations. eGFR's persistently <90 mL/min signify possible Chronic Kidney Disease.    Anion gap 14 5 - 15     HEENT: fair dentition Cardio: RRR and no murmur Resp: CTA B/L and unlabored GI: BS positive and NT, ND Extremity:  Pulses positive and No Edema Skin:   Intact Neuro: Alert/Oriented, Cranial Nerve Abnormalities Right central 7, Normal Sensory, Abnormal Motor 0/5 Right hand/wrist, 2- delt, bi,tri and Dysarthric Musc/Skel: tenderness  to palpation RIght shoulder, pain with PROM, +impingegement R shoulder, no pain in R elbow ,wrist or hand Gen NAD   Assessment/Plan: 1. Functional deficits secondary to Left pontine infarct causing R hemiparesis which require 3+ hours per day of interdisciplinary therapy in a comprehensive inpatient rehab setting. Physiatrist is providing close team supervision and 24 hour management of active medical problems listed below. Physiatrist and rehab  team continue to assess barriers to discharge/monitor patient progress toward functional and medical goals. FIM: FIM - Bathing Bathing Steps Patient Completed: Chest, Right Arm, Abdomen, Front perineal area, Right upper leg, Left upper leg Bathing: 3: Mod-Patient completes 5-7 70f10 parts or 50-74%  FIM - Upper Body Dressing/Undressing Upper body dressing/undressing steps patient completed: Thread/unthread right bra strap, Thread/unthread left bra strap, Thread/unthread left sleeve of pullover shirt/dress, Put head through opening of pull over shirt/dress Upper body dressing/undressing: 3: Mod-Patient completed 50-74% of tasks FIM - Lower Body Dressing/Undressing Lower body dressing/undressing steps patient completed: Thread/unthread left underwear leg, Thread/unthread left pants leg, Pull underwear up/down Lower body dressing/undressing: 2: Max-Patient completed 25-49% of tasks  FIM - Toileting Toileting steps completed by patient: Adjust clothing after toileting, Performs perineal hygiene, Adjust clothing prior to toileting Toileting Assistive Devices: Grab bar or rail for support Toileting: 4: Steadying assist     FIM - BControl and instrumentation engineerDevices: Arm rests Bed/Chair Transfer: 5: Supine > Sit: Supervision (verbal cues/safety issues), 5: Sit > Supine: Supervision (verbal cues/safety issues), 3: Bed > Chair or W/C: Mod A (lift or lower assist), 3: Chair or W/C > Bed: Mod A (lift or lower assist)  FIM - Locomotion: Wheelchair Distance: 150 Locomotion: Wheelchair: 5: Travels 150 ft or more: maneuvers on rugs and over door sills with supervision, cueing or coaxing FIM - Locomotion: Ambulation Locomotion: Ambulation Assistive Devices: Other (comment) (No AD, assisted at trunk and under R axilla) Ambulation/Gait Assistance: 3: Mod assist Locomotion: Ambulation: 1: Travels less than 50 ft with moderate assistance (Pt: 50 - 74%)  Comprehension Comprehension  Mode: Auditory Comprehension: 6-Follows complex conversation/direction: With extra time/assistive device  Expression Expression Mode: Verbal Expression: 4-Expresses basic 75 - 89% of the time/requires cueing 10 - 24% of the time. Needs helper to occlude trach/needs to repeat words.  Social Interaction Social Interaction: 5-Interacts appropriately 90% of the time - Needs monitoring or encouragement for participation or interaction.  Problem Solving Problem Solving: 5-Solves complex 90% of the time/cues < 10% of the time  Memory Memory: 5-Requires cues to use assistive device  Medical Problem List and Plan: 1. Functional deficits secondary to Left pontine infarct.   2.  DVT Prophylaxis/Anticoagulation: Pharmaceutical: Lovenox 3. Pain Management: Oxycodone prn for right shoulder pain. Ice as well as elevation for symptom management.   4. Mood: Reports of anxiety with adjustment reaction. Team to provide ego support. LCSW to follow for evaluation and support. Will consult chaplain services for follow up visit.   5. Neuropsych: This patient is capable of making decisions on her own  behalf. 6. Skin/Wound Care: Routine pressure relief measures.   7. Fluids/Electrolytes/Nutrition: Monitor I/O. Offer nutritonal supplements as needed if intake poor. Husband encouraged to bring food from home.   8. HTN: Monitor every 8 hours. Off prinizide--avoid hypotension to allow adequate perfusion.   9. Right shoulder pain: likely due to RTC pathology: Negative X rays , voltaren gel, KPad -exercises with PT -consider injection for pain relief 10. Right Thyroid nodules/Parotid nodule: Follow up with ENT on outpatient basis 11. Hyperlipidemia: unable to tolerate statins.    LOS (Days) 2 A FACE TO FACE EVALUATION WAS PERFORMED  SWARTZ,ZACHARY T 10/21/2014, 9:07 AM

## 2014-10-21 NOTE — Progress Notes (Signed)
Speech Language Pathology Daily Session Note  Patient Details  Name: Teresa Martinez MRN: [id number] Date of Birth: 04/04/1941  Today's Date: 10/21/2014 SLP Co-Treatment Time: 1105 [phone](1105-1205 co-tx with PT)-1135 SLP Co-Treatment Time Calculation (min): 30 min  Short Term Goals: Week 1: SLP Short Term Goal 1 (Week 1): Patient will produce speech at the sentence level during structured tasks with Min verbal cues to utilize intelligibility strategies SLP Short Term Goal 2 (Week 1): Patient will verbalize safety precautions with Supervision level question cues SLP Short Term Goal 3 (Week 1): Patient will utilize retrieval strategies with new information and during moments of reported anomia with Min verbal cues  Skilled Therapeutic Interventions: Skilled co-treatment session focused on addressing high-level, divided attention cognitive tasks as well as speech intelligibility goals.  Patient requierd Mod faded to The Sherwin-WilliamsSueprvision verbal cues to self-monitor and correct impulsivity as well as utilize adequate safety awareness with transfers and wheelchair mobility tasks.  Patient demonstrated difficulty dividing attention between dynamic standing tasks facilitated by PT and 3D puzzle contruction as well as during wheelchair mobility and conversational exchange and required Min cues complete tasks accurately.  SLP also reinforced education regarding use of speech intelligiblity compensatory strategies duirng verbal expression and patient utilized it in structured sentence level tasks with Min verbal cues.  Continue with current plan of care.    FIM:  Comprehension Comprehension Mode: Auditory Comprehension: 6-Follows complex conversation/direction: With extra time/assistive device Expression Expression Mode: Verbal Expression: 4-Expresses basic 75 - 89% of the time/requires cueing 10 - 24% of the time. Needs helper to occlude trach/needs to repeat words. Social Interaction Social Interaction:  5-Interacts appropriately 90% of the time - Needs monitoring or encouragement for participation or interaction. Problem Solving Problem Solving: 5-Solves basic 90% of the time/requires cueing < 10% of the time Memory Memory: 5-Recognizes or recalls 90% of the time/requires cueing < 10% of the time FIM - Eating Eating Activity: 5: Set-up assist for open containers;5: Set-up assist for cut food  Pain Pain Assessment Pain Assessment: No/denies pain  Therapy/Group: Individual Therapy  Charlane FerrettiMelissa Cobi Aldape, M.A., CCC-SLP [phone]  Donnisha Besecker 10/21/2014, 12:24 PM

## 2014-10-21 NOTE — Progress Notes (Addendum)
Physical Therapy Session Note  Patient Details  Name: Teresa Martinez MRN: [id number] Date of Birth: 05/28/1941  Today's Date: 10/21/2014 PT Co-Treatment Time: 1135 (60 minutes total co-treat with SLP)-1205 PT Co-Treatment Time Calculation (min): 30 min  Short Term Goals: Week 1:  PT Short Term Goal 1 (Week 1): Pt will perform dynamic standing balance with min A  PT Short Term Goal 2 (Week 1): Pt will perform stand pivot transfers with min A level  PT Short Term Goal 3 (Week 1): Pt will self propel w/c using L hemi technique x 150' at mod I level  PT Short Term Goal 4 (Week 1): Pt will ambulate x 50' w/ LRAD at min A level   Skilled Therapeutic Interventions/Progress Updates:    Neuromuscular Reeducation: PT instructs pt in R UE facilitation technique utilizing neurological overflow principle, having pt clasp hands in front of body, elbows bent by side, and instructing pt to adduct B humeri for 10 seconds: 2 x 5 reps. Pt has significant difficulty maintaining focus on adducting humeri for full 10 seconds, but as focus and attention to task improves, PT notes minimal movement in fingers towards flexion.  PT instructs pt in R triceps facilitation technique, instructing pt to lean on R forearm and push back up to midline with R UE - pt has a very strong core and is able to use L abdominal muscles to compensate, but PT is able to palpate triceps muscle activation during this activity.  PT assists pt in dynamic standing balance req min-mod A during pipe-building activity in stand on tray table, req R knee guarding/blocking due to R side crouched positioning occurring when attention is focused on pipe task, as opposed to standing balance - PT utilizes tactile cues on pt's quad to facilitate knee extension; with fatigue, pt leans more heavily to the R and forward, on tray table.   Therapeutic Activity: PT instructs pt in toilet transfer x 2 reps req CGA with grab bar for safety and assist for  donning/doffing pants/underwear. Pt is able to manage own anterior hygiene after urinating x 2.  PT instructs pt in w/c to/from mat transfer req min A for balance via stand-step transfer.  PT instructs pt in w/c to/from bed transfer x 2 reps req CGA-min A for balance - no bedrail used and squat-pivot transfer technique completed. PT instructs pt in sit to supine transfer req min A for R LE onto bed.   PT notes pt's impulsiveness does put her at a safety risk, but pt is correctable. Pt's R arm req support in sit, supine, and stand in order to reduce her c/o R shoulder pain, although pt denies shoulder pain during PT session, except for 2/10 pain during triceps facilitation activity. PT explains to pt that she can practice the grasp exercise when in bed to facilitate finger flexion on her own. Pt is a fast learner in compensation techniques and care will need to be taken that she does not develop learned nonuse, as inattention to the R side of her own body is noted during this session. Continue per PT POC.   Therapy Documentation Precautions:  Precautions Precautions: Fall Precaution Comments: R hemiparesis, impulsive, R lateral lean Restrictions Weight Bearing Restrictions: No  Pain: Pain Assessment Pain Assessment: No/denies pain  See FIM for current functional status  Therapy/Group: Co-Treatment with Melissa, SLP  [facility] HospitalYSLOP,Addison Whidbee M 10/21/2014, 12:32 PM

## 2014-10-21 NOTE — Progress Notes (Signed)
Physical Therapy Session Note  Patient Details  Name: Teresa Martinez MRN: [id number] Date of Birth: 01/31/1941  Today's Date: 10/21/2014 PT Individual Time: 1510-1610 PT Individual Time Calculation (min): 60 min   Short Term Goals: Week 1:  PT Short Term Goal 1 (Week 1): Pt will perform dynamic standing balance with min A  PT Short Term Goal 2 (Week 1): Pt will perform stand pivot transfers with min A level  PT Short Term Goal 3 (Week 1): Pt will self propel w/c using L hemi technique x 150' at mod I level  PT Short Term Goal 4 (Week 1): Pt will ambulate x 50' w/ LRAD at min A level   Skilled Therapeutic Interventions/Progress Updates:   Pt received semi reclined in bed, husband present for session. Pt transferred supine > sit with HOB slightly elevated and use of rail with supervision and performed modified stand pivot transfer bed > w/c with min A.  Pt propelled w/c using BLE and LUE with verbal cues for sequencing BLE x 150 ft with supervision. Gait training using hemi walker without AFO x 30 ft and trialled AFO x 30 ft with min-mod A and improved gait mechanics/safety with use of AFO and decreased R knee hyperextension in stance. Pt requires verbal cues for safe hemi walker placement, sequencing, upright posture, and cues to "squeeze" RLE to improve muscle activation/knee control with excellent response to cues. Pt negotiated up/down 5 stairs using L rail and mod A, cues for sequencing. For R NMR with focus on hip flexion and quad control, pt performed step ups on 5" step using L rail going up with RLE and down with LLE, therapist providing approximation and tactile cues for muscle activation through RLE, 2 x 5. With sit <> stand transfers from w/c, pt requires total cues for foot placement for increased RLE WB and cues for safe hand placement. Pt transitioned into tall kneeling on mat table with mod A x 1 with focus on hip and trunk extension with glute activation with S and BUE support  on small bench in front. Pt performed squats in tall kneeling x 20. Pt requesting to use bathroom, transferred back to w/c via stand pivot transfer with min A and transported back to room. Pt performed stand pivot transfer using grab bar and min A to/from toilet, pt able to perform hygiene and min A for standing balance while pt managed clothing. Pt washed hands from w/c level with verbal cues to incorporate RUE. Pt left sitting in w/c with quick release belt on and all needs within reach, husband present.   Therapy Documentation Precautions:  Precautions Precautions: Fall Precaution Comments: R hemiparesis, impulsive, R lateral lean Restrictions Weight Bearing Restrictions: No Pain: Pain Assessment Pain Assessment: No/denies pain  See FIM for current functional status  Therapy/Group: Individual Therapy  Kerney ElbeVarner, Kewon Statler A 10/21/2014, 4:10 PM

## 2014-10-21 NOTE — Progress Notes (Signed)
Occupational Therapy Session Note  Patient Details  Name: Linus OrnFrances H Mintz-Whitcomb MRN: [id number] Date of Birth: 11/29/1940  Today's Date: 10/21/2014 OT Individual Time: 0927-1027 OT Individual Time Calculation (min): 60 min    Short Term Goals: Week 1:  OT Short Term Goal 1 (Week 1): Pt will perform LB dressing with Mod A in order to decrease A with self care. OT Short Term Goal 2 (Week 1): Pt will perform bathing with Min A and use of bath mitt on R UE in order to increase independence and incorporate R UE into functional task. OT Short Term Goal 3 (Week 1): Pt will perform shower transfer with Min A stand pivot transfer onto TTB. OT Short Term Goal 4 (Week 1): Pt will perform UB dressing with Min A in order to increase I in self care.   Skilled Therapeutic Interventions/Progress Updates:  Upon entering the room, supine in bed with with no c/o pain this session. OT session with focus on self care, safety awareness, dynamic standing balance, sitting balance, and functional transfers. Pt very impulsive this session such as attempting to stand without assist and not locking wheelchair. Pt requiring min verbal cues to make sure she is attending to R UE during functional tasks in order to not injure self. Pt transferred onto TTB with Mod A secondary to urgency and back to wheelchair with Min A. Pt requiring assistance with clothing management thus requiring Mod A for toileting. Pt dressed self from wheelchair at sink side with assist to hook bra and then donning bra like sports bra in an attempt to make more independent with task. Pt able to perform one handed technique to donn and doff bilateral socks as well as donn L shoe with elastic laces placed. OT discussed session with pt to determine insight into safety awareness in which pt report that she knows she made mistakes but unable to report what she did wrong. Pt requesting to return to bed with bed alarm on and call bell within reach.   Therapy  Documentation Precautions:  Precautions Precautions: Fall Precaution Comments: R hemiparesis, impulsive, R lateral lean Restrictions Weight Bearing Restrictions: No Pain: Pain Assessment Pain Assessment: No/denies pain  See FIM for current functional status  Therapy/Group: Individual Therapy  Lowella Gripittman, Gael Londo L 10/21/2014, 12:02 PM

## 2014-10-21 NOTE — IPOC Note (Addendum)
Overall Plan of Care [facility](IPOC) Patient Details Name: Teresa Martinez MRN: [id number] DOB: 10/24/1941  Admitting Diagnosis: l pontine cva with rt rot cuff issues  Hospital Problems: Principal Problem:   Left pontine stroke Active Problems:   Essential hypertension   Chronic right shoulder pain   Right rotator cuff tendonitis     Functional Problem List: Nursing Bladder, Endurance, Motor, Pain  PT Balance, Motor, Pain, Safety  OT Balance, Motor, Pain, Safety  SLP Linguistic  TR Activity tolerance, functional mobility, balance, cognition, safety, pain, anxiety/stress        Basic ADL's: OT Grooming, Bathing, Dressing, Toileting     Advanced  ADL's: OT Simple Meal Preparation, Laundry     Transfers: PT Bed Mobility, Bed to Chair, Car, State Street CorporationFurniture, Floor  OT Toilet, Research scientist (life sciences)Tub/Shower     Locomotion: PT Ambulation, Psychologist, prison and probation servicesWheelchair Mobility, Stairs     Additional Impairments: OT Fuctional Use of Upper Extremity  SLP Communication expression    TR      Anticipated Outcomes Item Anticipated Outcome  Self Feeding  (n/a)  Swallowing      Basic self-care  supervision - Min A  Toileting  supervision   Bathroom Transfers supervision  Bowel/Bladder  Continent of bowel and bladder  Transfers  S  Locomotion  S  Communication  Supervision   Cognition     Pain  </=3  Safety/Judgment  No falls with injury   Therapy Plan: PT Intensity: Minimum of 1-2 x/day ,45 to 90 minutes PT Frequency: 5 out of 7 days PT Duration Estimated Length of Stay: 12-14 days  OT Intensity: Minimum of 1-2 x/day, 45 to 90 minutes OT Frequency: 5 out of 7 days OT Duration/Estimated Length of Stay: 12-14 days SLP Intensity: Minumum of 1-2 x/day, 30 to 90 minutes SLP Frequency: 5 out of 7 days SLP Duration/Estimated Length of Stay: 12-14 days  TR Duration/ELOS:  2 weeks TR Frequency:  Min 1 time per week >20 minutes        Team Interventions: Nursing Interventions Patient/Family Education,  Bladder Management, Pain Management, Medication Management, Psychosocial Support  PT interventions Ambulation/gait training, Balance/vestibular training, Cognitive remediation/compensation, Discharge planning, Disease management/prevention, DME/adaptive equipment instruction, Functional electrical stimulation, Functional mobility training, Neuromuscular re-education, Pain management, Patient/family education, Psychosocial support, Splinting/orthotics, Stair training, Therapeutic Activities, Therapeutic Exercise, UE/LE Strength taining/ROM, UE/LE Coordination activities, Wheelchair propulsion/positioning  OT Interventions Balance/vestibular training, Discharge planning, Functional electrical stimulation, Pain management, Self Care/advanced ADL retraining, Therapeutic Activities, UE/LE Coordination activities, Functional mobility training, Patient/family education, Therapeutic Exercise, Wheelchair propulsion/positioning, UE/LE Strength taining/ROM, Psychosocial support, Neuromuscular re-education, DME/adaptive equipment instruction, Community reintegration  SLP Interventions Cueing hierarchy, Environmental controls, Functional tasks, Internal/external aids, Patient/family education, Speech/Language facilitation  TR Interventions Recreation/leisure participation, Balance/Vestibular training, functional mobility, therapeutic activities, UE/LE strength/coordination, cognitive retraining/compensation, w/c mobility, community reintegration, pt/family education, adaptive equipment instruction/use, discharge planning, psychosocial support  SW/CM Interventions Discharge Planning, Facilities managersychosocial Support, Patient/Family Education    Team Discharge Planning: Destination: PT-Home ,OT- Home , SLP-Home Projected Follow-up: PT-Home health PT, Outpatient PT, OT-  Other (comment) (HHOT vs outpatient pending progress and pt transportation), SLP-Other (comment) (TBD) Projected Equipment Needs: PT-To be determined, OT- To be  determined, SLP-None recommended by SLP Equipment Details: PT- , OT-  Patient/family involved in discharge planning: PT- Patient,  OT-Patient, SLP-Patient  MD ELOS: 12-14 days Medical Rehab Prognosis:  Excellent Assessment: The patient has been admitted for CIR therapies with the diagnosis of left pontine infarct. . The team will be addressing functional mobility, strength, stamina, balance, safety, adaptive  techniques and equipment, self-care, bowel and bladder mgt, patient and caregiver education, NMR, pain mgt, communication, ego support, leisure awareness, RTC management/exercises. . Goals have been set at supervision for mobility, self-care, communication and cognition.    Ranelle OysterZachary T. Swartz, MD, FAAPMR      See Team Conference Notes for weekly updates to the plan of care

## 2014-10-22 ENCOUNTER — Inpatient Hospital Stay (HOSPITAL_COMMUNITY): Payer: Medicare PPO | Admitting: Physical Therapy

## 2014-10-22 ENCOUNTER — Inpatient Hospital Stay (HOSPITAL_COMMUNITY): Payer: Medicare PPO | Admitting: Occupational Therapy

## 2014-10-22 ENCOUNTER — Inpatient Hospital Stay (HOSPITAL_COMMUNITY): Payer: Medicare PPO | Admitting: Speech Pathology

## 2014-10-22 NOTE — Plan of Care (Signed)
Problem: RH BLADDER ELIMINATION Goal: RH STG MANAGE BLADDER WITH ASSISTANCE STG Manage Bladder With Min Assistance  Outcome: Progressing  Problem: RH PAIN MANAGEMENT Goal: RH STG PAIN MANAGED AT OR BELOW PT'S PAIN GOAL < 3 on a 0-10 pain scale.  Outcome: Progressing  Problem: RH SKIN INTEGRITY Goal: RH STG SKIN FREE OF INFECTION/BREAKDOWN Remain free from infection/breakdown while on rehab with min assist.  Outcome: Progressing  Problem: RH SAFETY Goal: RH STG DECREASED RISK OF FALL WITH ASSISTANCE STG Decreased Risk of Fall With Min Assistance.  Outcome: Progressing     

## 2014-10-22 NOTE — Progress Notes (Signed)
Physical Therapy Session Note  Patient Details  Name: Linus OrnFrances H Mintz-Whitcomb MRN: [id number] Date of Birth: 02/22/1941  Today's Date: 10/22/2014 PT Individual Time: 1100-1200 PT Individual Time Calculation (min): 60 min   Short Term Goals: Week 1:  PT Short Term Goal 1 (Week 1): Pt will perform dynamic standing balance with min A  PT Short Term Goal 2 (Week 1): Pt will perform stand pivot transfers with min A level  PT Short Term Goal 3 (Week 1): Pt will self propel w/c using L hemi technique x 150' at mod I level  PT Short Term Goal 4 (Week 1): Pt will ambulate x 50' w/ LRAD at min A level   Skilled Therapeutic Interventions/Progress Updates:  Pt was seen bedside in the am. Pt transferred supine to edge of bed with side rail and S. Pt transferred edge of bed to w/c with min A and verbal cues for safety. Pt propelled w/c about 150 feet with B LEs and L UE with S. Pt performed multiple sit to stand with hemiwalker and min A with verbal cues. Pt ambulated 30 and 80 feet with hemiwalker and min to mod A with verbal cues. Pt ambulated 80 feet with WBQC and min to mod A with verbal cues. Pt returned to room. Pt performed w/c to toilet, toilet to w/c transfers with grab bar and min A with verbal cues. Pt requires verbal cues throughout treatment due to decreased safety awareness and impulsiveness.   Therapy Documentation Precautions:  Precautions Precautions: Fall Precaution Comments: R hemiparesis, impulsive, R lateral lean Restrictions Weight Bearing Restrictions: No General:   Vital Signs:   Pain: No c/o pain.    Locomotion : Ambulation Ambulation/Gait Assistance: 4: Min assist;3: Mod assist   See FIM for current functional status  Therapy/Group: Individual Therapy  Rayford HalstedMitchell, Enzley Kitchens G 10/22/2014, 12:49 PM

## 2014-10-22 NOTE — Progress Notes (Signed)
Occupational Therapy Session Note  Patient Details  Name: Teresa Martinez MRN: [id number] Date of Birth: 11/11/1941  Today's Date: 10/22/2014 OT Individual Time: [id number] and [id number] OT Individual Time Calculation (min): 60 min and 30 min   Short Term Goals: Week 1:  OT Short Term Goal 1 (Week 1): Pt will perform LB dressing with Mod A in order to decrease A with self care. OT Short Term Goal 2 (Week 1): Pt will perform bathing with Min A and use of bath mitt on R UE in order to increase independence and incorporate R UE into functional task. OT Short Term Goal 3 (Week 1): Pt will perform shower transfer with Min A stand pivot transfer onto TTB. OT Short Term Goal 4 (Week 1): Pt will perform UB dressing with Min A in order to increase I in self care.   Skilled Therapeutic Interventions/Progress Updates:  Session 1: Upon entering the room, pt supine in bed with no c/o pain. OT session with focus on safety awareness, self care - shower, hemiplegic dressing techniques, self ROM exercises for R UE, STS, dynamic standing balance, and functional transfers. Pt did much better with safety awareness at times but not consistent with applying wheelchair brakes before attempting to stand. Pt performed STS and functional transfers this session with Min A balance. Pt able to connect bra clasp one handed and donn bra with supervision. OT educated self ROM HEP for R UE in all places with pt performing x 5 reps with verbal cues for proper technique. Pt seated in wheelchair with breakfast set up, call bell, and all other needed items within reach upon exiting the room.   Session 2: Upon entering room, pt seated in wheelchair with no c/o pain this session. Pt propelled wheelchair ~ 200 feet with L foot and arm with increased time and 1 rest break needed. OT educated pt on weight bearing technique during functional task. Pt standing with Min A at counter level with assistance to place R UE onto counter top  for weight bearing ~ 5 minutes while pt opened cabinet doors and obtained dishes and various items from cabinets. Pt verbalizing understanding of tasks. Therapist discussed other functional tasks in standing in which pt could engage in weight bearing as well. Pt side stepped 5 steps to the L and then 4 to the R while holding onto counter. Pt requiring verbal cues to take R UE with her as she sat into wheelchair at end of session. Pt returned to room where family entering. Call bell and all other needed items within reach.   Therapy Documentation Precautions:  Precautions Precautions: Fall Precaution Comments: R hemiparesis, impulsive, R lateral lean Restrictions Weight Bearing Restrictions: No Pain: Pain Assessment Pain Assessment: No/denies pain Pain Score: 0-No pain Faces Pain Scale: No hurt Pain Type: Chronic pain Pain Location: Shoulder Pain Orientation: Right Pain Onset: With Activity Pain Intervention(s): Medication (See eMAR) PAINAD (Pain Assessment in Advanced Dementia) Breathing: normal  See FIM for current functional status  Therapy/Group: Individual Therapy  Lowella Gripittman, Jovaughn Wojtaszek L 10/22/2014, 12:43 PM

## 2014-10-22 NOTE — Progress Notes (Signed)
Speech Language Pathology Daily Session Note  Patient Details  Name: Teresa Martinez MRN: [id number] Date of Birth: 08/04/1941  Today's Date: 10/22/2014 SLP Individual Time: 1015-1050 SLP Individual Time Calculation (min): 35 min  Short Term Goals: Week 1: SLP Short Term Goal 1 (Week 1): Patient will produce speech at the sentence level during structured tasks with Min verbal cues to utilize intelligibility strategies SLP Short Term Goal 2 (Week 1): Patient will verbalize safety precautions with Supervision level question cues SLP Short Term Goal 3 (Week 1): Patient will utilize retrieval strategies with new information and during moments of reported anomia with Min verbal cues  Skilled Therapeutic Interventions: Skilled treatment session focused on addressing dysarthria goals.  SLP facilitated session with structured, paragraph level reading task as well as Supervision verbal and visual cues to self-monitor and correct rate and use of over articulation.  Continue with current plan of care.    FIM:  Comprehension Comprehension Mode: Auditory Comprehension: 7-Follows complex conversation/direction: With no assist Expression Expression Mode: Verbal Expression: 5-Expresses complex 90% of the time/cues < 10% of the time Social Interaction Social Interaction: 5-Interacts appropriately 90% of the time - Needs monitoring or encouragement for participation or interaction. Problem Solving Problem Solving: 5-Solves basic 90% of the time/requires cueing < 10% of the time Memory Memory: 6-Assistive device: No helper  Pain Pain Assessment Pain Assessment: No/denies pain Pain Score: 0-No pain  Therapy/Group: Individual Therapy  Charlane FerrettiMelissa Lynnet Hefley, M.A., CCC-SLP [phone]  [name] 10/22/2014, 12:35 PM

## 2014-10-22 NOTE — Progress Notes (Signed)
Teresa SchaumannFrances H Martinez is a 73 y.o. female 07/25/1941 [id number]  Subjective: No new complaints. No new problems. asking about PCP (PK) -  Objective: Vital signs in last 24 hours: Temp:  [97.8 F (36.6 C)-98.2 F (36.8 C)] 97.8 F (36.6 C) (12/05 0527) Pulse Rate:  [60-66] 60 (12/05 0527) Resp:  [2-17] 17 (12/05 0527) BP: (125-132)/(71-72) 125/71 mmHg (12/05 0527) SpO2:  [97 %-100 %] 100 % (12/05 0527) Weight change:  Last BM Date: 10/20/14  Intake/Output from previous day: 12/04 0701 - 12/05 0700 In: 600 [P.O.:600] Out: -   Physical Exam General: No apparent distress  [facility]    Lungs: Normal effort. Lungs clear to auscultation, no crackles or wheezes. Cardiovascular: Regular rate and rhythm, no edema Neurological: No new neurological deficits Wounds: N/A     Lab Results: BMET    Component Value Date/Time   NA 144 10/20/2014 0431   K 4.0 10/20/2014 0431   CL 107 10/20/2014 0431   CO2 23 10/20/2014 0431   GLUCOSE 89 10/20/2014 0431   BUN 17 10/20/2014 0431   CREATININE 0.82 10/20/2014 0431   CALCIUM 9.2 10/20/2014 0431   GFRNONAA 68* 10/20/2014 0431   GFRAA 79* 10/20/2014 0431   CBC    Component Value Date/Time   WBC 6.3 10/20/2014 0431   RBC 4.88 10/20/2014 0431   HGB 12.6 10/20/2014 0431   HCT 39.2 10/20/2014 0431   PLT 224 10/20/2014 0431   MCV 80.3 10/20/2014 0431   MCH 25.8* 10/20/2014 0431   MCHC 32.1 10/20/2014 0431   RDW 14.4 10/20/2014 0431   LYMPHSABS 2.3 10/20/2014 0431   MONOABS 0.5 10/20/2014 0431   EOSABS 0.3 10/20/2014 0431   BASOSABS 0.0 10/20/2014 0431   CBG's (last 3):  No results for input(s): GLUCAP in the last 72 hours. LFT's Lab Results  Component Value Date   ALT 12 10/20/2014   AST 16 10/20/2014   ALKPHOS 48 10/20/2014   BILITOT 0.2* 10/20/2014    Studies/Results: No results found.  Medications:  I have reviewed the patient's current medications. Scheduled Medications: . aspirin EC  325 mg Oral Daily  .  diclofenac sodium  2 g Topical QID  . enoxaparin (LOVENOX) injection  40 mg Subcutaneous Q24H  . pantoprazole  40 mg Oral QHS   PRN Medications: acetaminophen, alum & mag hydroxide-simeth, bisacodyl, guaiFENesin-dextromethorphan, oxyCODONE, prochlorperazine **OR** prochlorperazine **OR** prochlorperazine, senna-docusate, traMADol, traZODone  Assessment/Plan: Principal Problem:   Left pontine stroke Active Problems:   Essential hypertension   Chronic right shoulder pain   Right rotator cuff tendonitis  1. Functional deficits secondary to Left pontine infarct.  2. DVT Prophylaxis/Anticoagulation: Pharmaceutical: Lovenox 3. Pain Management: Oxycodone prn for right shoulder pain. Ice as well as elevation for symptom management.  4. Mood: Reports of anxiety with adjustment reaction. Team to provide ego support. LCSW to follow for evaluation and support. Will consult chaplain services for follow up visit.  5. Neuropsych: This patient is capable of making decisions on her own behalf. 6. Skin/Wound Care: Routine pressure relief measures.  7. Fluids/Electrolytes/Nutrition: Monitor I/O. Offer nutritonal supplements as needed if intake poor. Husband encouraged to bring food from home.  8. HTN: Monitor every 8 hours. Off prinizide--avoid hypotension to allow adequate perfusion.  9. Right shoulder pain: chronic symptoms PTA likely due to RTC pathology: Negative X rays , voltaren gel, KPad -exercises with PT -consider injection for pain relief 10. Right Thyroid nodules/Parotid nodule: Follow up with ENT on outpatient basis 11. Hyperlipidemia: unable  to tolerate statins.    Length of stay, days: 3  Valerie A. Felicity CoyerLeschber, MD 10/22/2014, 10:44 AM

## 2014-10-23 ENCOUNTER — Inpatient Hospital Stay (HOSPITAL_COMMUNITY): Payer: Medicare PPO | Admitting: *Deleted

## 2014-10-23 NOTE — Progress Notes (Signed)
Occupational Therapy Session Note  Patient Details  Name: Teresa Martinez MRN: [id number] Date of Birth: 05/06/1941  Today's Date: 10/23/2014 OT Individual Time: 1710-1750 OT Individual Time Calculation (min): 40 min    Short Term Goals: Week 1:  OT Short Term Goal 1 (Week 1): Pt will perform LB dressing with Mod A in order to decrease A with self care. OT Short Term Goal 2 (Week 1): Pt will perform bathing with Min A and use of bath mitt on R UE in order to increase independence and incorporate R UE into functional task. OT Short Term Goal 3 (Week 1): Pt will perform shower transfer with Min A stand pivot transfer onto TTB. OT Short Term Goal 4 (Week 1): Pt will perform UB dressing with Min A in order to increase I in self care.  Week 2:     Skilled Therapeutic Interventions/Progress Updates:   Addressed  dynamic standing balance, activity tolerance, weight bearing, R UE neuro re-edu, functional transfers, and safety awareness. Pt finished eating upon OT arrival.  Transferred from EOB to wc.  Taken to gym and transferred to mat with minimal assist.  Instructed pt on  RUE exercises.  Performed AAROM, PNF patterns of movement, weight bearing on various surfaces (stool, ball, mat).  Performed weight bearing on mat in standing with both hands on mat.  Pt was able to maintain elbow extension for 10 seconds before fatigue.  Did contract, hold and released in each activity.  Pt transferred back to wc and taken to room.  Left pt in wc with safety belt on and call bell,phone within reach.     Therapy Documentation Precautions:  Precautions Precautions: Fall Precaution Comments: R hemiparesis, impulsive, R lateral lean Restrictions Weight Bearing Restrictions: No       Pain: Pain Assessment Pain Assessment: 0-10 Pain Score: 0-No pain Faces Pain Scale: No hurt Pain Type: Acute pain Pain Location: Shoulder Pain Onset: Gradual Pain Intervention(s): Medication (See eMAR) PAINAD  (Pain Assessment in Advanced Dementia) Breathing: normal       :    See FIM for current functional status  Therapy/Group: Individual Therapy  Humberto Sealsdwards, Lucette Kratz J 10/23/2014, 6:06 PM

## 2014-10-23 NOTE — Progress Notes (Signed)
Teresa SchaumannFrances H Martinez is a 73 y.o. female 03/26/1941 [id number]  Subjective: No new complaints. No new problems. Denies new pain (R shoulder chronic).  Objective: Vital signs in last 24 hours: Temp:  [97.8 F (36.6 C)-98.5 F (36.9 C)] 98.1 F (36.7 C) (12/06 0627) Pulse Rate:  [56-61] 57 (12/06 0627) Resp:  [16-17] 17 (12/06 0627) BP: (124-128)/(51-69) 124/59 mmHg (12/06 0627) SpO2:  [96 %-98 %] 98 % (12/06 0627) Weight change:  Last BM Date: 10/22/14  Intake/Output from previous day: 12/05 0701 - 12/06 0700 In: 1320 [P.O.:1320] Out: -   Physical Exam General: No apparent distress      Lungs: Normal effort. Lungs clear to auscultation, no crackles or wheezes. Cardiovascular: Regular rate and rhythm, no edema Neurological: No new neurological deficits,R HP Wounds: N/A     Lab Results: BMET    Component Value Date/Time   NA 144 10/20/2014 0431   K 4.0 10/20/2014 0431   CL 107 10/20/2014 0431   CO2 23 10/20/2014 0431   GLUCOSE 89 10/20/2014 0431   BUN 17 10/20/2014 0431   CREATININE 0.82 10/20/2014 0431   CALCIUM 9.2 10/20/2014 0431   GFRNONAA 68* 10/20/2014 0431   GFRAA 79* 10/20/2014 0431   CBC    Component Value Date/Time   WBC 6.3 10/20/2014 0431   RBC 4.88 10/20/2014 0431   HGB 12.6 10/20/2014 0431   HCT 39.2 10/20/2014 0431   PLT 224 10/20/2014 0431   MCV 80.3 10/20/2014 0431   MCH 25.8* 10/20/2014 0431   MCHC 32.1 10/20/2014 0431   RDW 14.4 10/20/2014 0431   LYMPHSABS 2.3 10/20/2014 0431   MONOABS 0.5 10/20/2014 0431   EOSABS 0.3 10/20/2014 0431   BASOSABS 0.0 10/20/2014 0431   CBG's (last 3):  No results for input(s): GLUCAP in the last 72 hours. LFT's Lab Results  Component Value Date   ALT 12 10/20/2014   AST 16 10/20/2014   ALKPHOS 48 10/20/2014   BILITOT 0.2* 10/20/2014    Studies/Results: No results found.  Medications:  I have reviewed the patient's current medications. Scheduled Medications: . aspirin EC  325 mg Oral Daily   . diclofenac sodium  2 g Topical QID  . enoxaparin (LOVENOX) injection  40 mg Subcutaneous Q24H   PRN Medications: acetaminophen, alum & mag hydroxide-simeth, bisacodyl, guaiFENesin-dextromethorphan, oxyCODONE, prochlorperazine **OR** prochlorperazine **OR** prochlorperazine, senna-docusate, traMADol, traZODone  Assessment/Plan: Principal Problem:   Left pontine stroke Active Problems:   Essential hypertension   Chronic right shoulder pain   Right rotator cuff tendonitis  1. Functional deficits secondary to Left pontine infarct.  2. DVT Prophylaxis/Anticoagulation: Pharmaceutical: Lovenox 3. Pain Management: Oxycodone prn for right shoulder pain. Ice as well as elevation for symptom management.  4. Mood: Reports of anxiety with adjustment reaction. Team to provide ego support. LCSW to follow for evaluation and support. Will consult chaplain services for follow up visit.  5. Neuropsych: This patient is capable of making decisions on her own behalf. 6. Skin/Wound Care: Routine pressure relief measures.  7. Fluids/Electrolytes/Nutrition: Monitor I/O. Offer nutritonal supplements as needed if intake poor. Husband encouraged to bring food from home.  8. HTN: Monitor every 8 hours. Off prinizide--avoid hypotension to allow adequate perfusion.  9. Right shoulder pain: chronic symptoms PTA likely due to RTC pathology: Negative X rays , voltaren gel, KPad -exercises with PT -consider injection for pain relief 10. Right Thyroid nodules/Parotid nodule: Follow up with ENT on outpatient basis 11. Hyperlipidemia: unable to tolerate statins.    Length of  stay, days: 4  Liesl Simons A. Felicity CoyerLeschber, MD 10/23/2014, 10:06 AM

## 2014-10-24 ENCOUNTER — Inpatient Hospital Stay (HOSPITAL_COMMUNITY): Payer: Medicare PPO | Admitting: Occupational Therapy

## 2014-10-24 ENCOUNTER — Inpatient Hospital Stay (HOSPITAL_COMMUNITY): Payer: Medicare PPO | Admitting: Rehabilitation

## 2014-10-24 ENCOUNTER — Inpatient Hospital Stay (HOSPITAL_COMMUNITY): Payer: Medicare PPO | Admitting: Speech Pathology

## 2014-10-24 DIAGNOSIS — M25511 Pain in right shoulder: Secondary | ICD-10-CM

## 2014-10-24 DIAGNOSIS — G8929 Other chronic pain: Secondary | ICD-10-CM

## 2014-10-24 MED ORDER — BETAMETHASONE SOD PHOS & ACET 6 (3-3) MG/ML IJ SUSP
12.0000 mg | Freq: Once | INTRAMUSCULAR | Status: AC
  Administered 2014-10-24: 12 mg via INTRAMUSCULAR
  Filled 2014-10-24: qty 2

## 2014-10-24 MED ORDER — LIDOCAINE HCL (PF) 2 % IJ SOLN
10.0000 mL | Freq: Once | INTRAMUSCULAR | Status: AC
  Administered 2014-10-24: 10 mL
  Filled 2014-10-24 (×2): qty 10

## 2014-10-24 NOTE — Progress Notes (Signed)
Occupational Therapy Session Note  Patient Details  Name: Teresa Martinez MRN: [id number] Date of Birth: 03/13/1941  Today's Date: 10/24/2014 OT Individual Time: 1100-1200 OT Individual Time Calculation (min): 60 min    Short Term Goals: Week 1:  OT Short Term Goal 1 (Week 1): Pt will perform LB dressing with Mod A in order to decrease A with self care. OT Short Term Goal 2 (Week 1): Pt will perform bathing with Min A and use of bath mitt on R UE in order to increase independence and incorporate R UE into functional task. OT Short Term Goal 3 (Week 1): Pt will perform shower transfer with Min A stand pivot transfer onto TTB. OT Short Term Goal 4 (Week 1): Pt will perform UB dressing with Min A in order to increase I in self care.   Skilled Therapeutic Interventions/Progress Updates:    Engaged in ADL retraining with focus on increased participation and safety with self-care tasks of bathing and dressing.  Issued pt a bath mit to increase use of RUE into bathing, as pt is developing ROM but continues to lack grasp.  Stand pivot transfer bed > w/c > toilet > shower chair with min assist, pt very impulsive with mobility attempting to transfer prior to positioning and locking w/c brakes.  Pt attempted to ambulate without shoes, AFO, and quad cane toilet to walk-in shower requiring mod assist and redirection to return to w/c and perform transfers from w/c level at this time.  Utilized bath mit during bathing with increased independence and lateral leans to wash buttocks.  Educated on donning Rt shoe with AFO after pt requesting therapist do it for her.  In therapy gym engaged in RUE NMR with UE Ranger with focus on increased control and coordination of UE movement, providing visual target and boundaries.  Pt reports needing to void again, performed stand pivot transfer w/c > toilet with min assist, requiring assist with clothing up and down.  Pt transferred back to bed at end of session, with RN  present.  Therapy Documentation Precautions:  Precautions Precautions: Fall Precaution Comments: R hemiparesis, impulsive, R lateral lean Restrictions Weight Bearing Restrictions: No Pain:  Pt with no c/o pain  See FIM for current functional status  Therapy/Group: Individual Therapy  Rosalio LoudHOXIE, Ashby Leflore 10/24/2014, 12:06 PM

## 2014-10-24 NOTE — Progress Notes (Signed)
Physical Therapy Session Note  Patient Details  Name: Teresa Martinez MRN: [id number] Date of Birth: 02/17/1941  Today's Date: 10/24/2014 PT Individual Time: 0830-0933 PT Individual Time Calculation (min): 63 min   Short Term Goals: Week 1:  PT Short Term Goal 1 (Week 1): Pt will perform dynamic standing balance with min A  PT Short Term Goal 2 (Week 1): Pt will perform stand pivot transfers with min A level  PT Short Term Goal 3 (Week 1): Pt will self propel w/c using L hemi technique x 150' at mod I level  PT Short Term Goal 4 (Week 1): Pt will ambulate x 50' w/ LRAD at min A level   Skilled Therapeutic Interventions/Progress Updates:   Pt received lying in bed this morning, agreeable to therapy.  Husband present in room initially, however did not attend session due to having an appointment.  Performed bed mobility with HOB flat and without rails at S level, despite pt stating "I can't" prior to attempt.  Assisted with donning shoes prior to transfer with R reaction AFO for DF assist.  Performed stand pivot transfer at min A level with mod cues for stepping sequence and safety, as she continues to be very impulsive.  Pt self propelled to/from therapy gym x 150' x 2 reps at S level.  Once at therapy gym, worked on gait training with hemi walker.  Pt able to ambulate at min/mod A with continued cues for upright posture, stepping sequence, increased step length on LLE, and facilitation for increased weight bearing and weight shift through RLE during stance.  Pt tends to demonstrate unbalanced muscle activation during gait, esp during stepping RLE.  Tends to compensate with overactive trunk movements.  Performed gait x 70' x 2 reps.  Progressed to standing NMR task with stepping LLE to 4" step w/ UE support>w/o UE support x 10 reps each for increased weight bearing and weight shift through RLE.  Also progressed to tapping RLE to target on step with and without UE support x 10 reps each for  increased coordination of movement.  Also noted zero balance strategies during stepping and gait activities with intermittent LOB to the R with pt needing total A cues to correct as she would state "push me back over."  Educated on ankle and hip strategies to improve balance during standing.  Pt then returned to room briefly in order to receive cortisone shot in R shoulder from MD, but returned to therapy following this.  Ended session with trial of gait with LBQC x 50'.  Pt requires min to mod A, again for LOB to the R.  Continues to have decreased fluidity of movement in RLE during advancement of limb.  Pt assisted back to room and left in w/c with quick release belt donned and all needs in reach.    Therapy Documentation Precautions:  Precautions Precautions: Fall Precaution Comments: R hemiparesis, impulsive, R lateral lean Restrictions Weight Bearing Restrictions: No  Pain: Pain Assessment Pain Assessment: No/denies pain Pain Score: 0-No pain (While at rest):   Locomotion : Ambulation Ambulation/Gait Assistance: 4: Min assist;3: Mod assist Wheelchair Mobility Distance: 150   See FIM for current functional status  Therapy/Group: Individual Therapy  Vista Deckarcell, Qusay Villada Ann 10/24/2014, 10:42 AM

## 2014-10-24 NOTE — Progress Notes (Signed)
Physical Therapy Session Note  Patient Details  Name: Linus OrnFrances H Mintz-Whitcomb MRN: [id number] Date of Birth: 03/30/1941  Today's Date: 10/24/2014 PT Individual Time: 1300-1330 PT Individual Time Calculation (min): 30 min   Short Term Goals: Week 1:  PT Short Term Goal 1 (Week 1): Pt will perform dynamic standing balance with min A  PT Short Term Goal 2 (Week 1): Pt will perform stand pivot transfers with min A level  PT Short Term Goal 3 (Week 1): Pt will self propel w/c using L hemi technique x 150' at mod I level  PT Short Term Goal 4 (Week 1): Pt will ambulate x 50' w/ LRAD at min A level   Skilled Therapeutic Interventions/Progress Updates:   Pt received lying in bed, agreeable to therapy session.  Pt requesting to use restroom prior to going to therapy gym.  Pt able to get to EOB at S level.  Provided assist for donning shoes and R AFO due to urgency.  Pt continues to require mod to max cues each time she sits or stands to push up or reach back to seating surface prior to performing transfer.  [facility] mod A level.  Continues to demonstrate zero balance reactions and loses balance to her R with pt stating "push me back" or "don't let me fall."  She requires cues for self correction of balance.  Again noted to be so in the restroom as she almost fell into wall to her R with max cues for safety and concern for falling.  Pt able to ambulate back to sink to wash/dry hands and brush teeth while standing.  Again, requires min A with constant cues for shifting weight back to her L, as well as increasing RLE extension.  Discussed remainder of session focusing on floor recovery due to her continued decreased safety awareness and impulsivity.  Assisted pt to therapy gym and set up mat to perform floor recovery.  Questioned pt regarding when to attempt floor transfer vs when to call for 911.  Pt unable to process PT's question, despite being cued in many different ways.   Provided total A cues for when to call 911.  Pt verbalized understanding and was questioned again at end of session with pt recalling 2/3 reasons when to not get up.  PT demonstrated how to perform transfer safely and pt able to return demonstration with min/mod A and max verbal cues for safety of RUE during transfers.  Pt states that transfer was "hard" but that she would get better.  Educated that purpose was to increase her awareness of possibility of having a fall.  Then questioned her on how she would increase safety at home with mobility.  Pt requires total A cues for recalling that she would need her cane and someone with her when moving.  Assisted pt back to room and back to bed.  Left in bed with bed alarm set and all needs in reach.   Therapy Documentation Precautions:  Precautions Precautions: Fall Precaution Comments: R hemiparesis, impulsive, R lateral lean Restrictions Weight Bearing Restrictions: No  Pain: Pain Assessment Pain Assessment: No/denies pain   Locomotion : Ambulation Ambulation/Gait Assistance: 4: Min assist;3: Mod assist   See FIM for current functional status  Therapy/Group: Individual Therapy  Vista Deckarcell, Guillermina Shaft Ann 10/24/2014, 3:51 PM

## 2014-10-24 NOTE — Progress Notes (Signed)
73 y.o. RH-female with history of HTN, hyperlipidemia who was admitted on 10/14/14 with acute onset of slurred speech and right sided weakness. Patient with sputtering symptoms and was treated with tPA due to recurrent nature of symptoms. CTA head/neck with 50% stenosis of R-ICA with luminal irregularity of bilateral ICA question fibromuscular dysplasia, severe calcific atherosclerosis of the carotid siphon and mild luminal irregularity of VA with extrinsic compression due to cervical spine degenerative changes. MRI/MRA brain done revealing acute left pontine infarct with lobular mass right parotid gland  Subjective/Complaints: Right shoulder pain, worse with overhead activity or at noc Review of Systems - Negative except Right side weak  Objective: Vital Signs: Blood pressure 123/62, pulse 58, temperature 98.3 F (36.8 C), temperature source Oral, resp. rate 16, height 5\' 6"  (1.676 m), weight 64.4 kg (141 lb 15.6 oz), SpO2 95 %. No results found. No results found for this or any previous visit (from the past 72 hour(s)).   HEENT: fair dentition Cardio: RRR and no murmur Resp: CTA B/L and unlabored GI: BS positive and NT, ND Extremity:  Pulses positive and No Edema Skin:   Intact Neuro: Alert/Oriented, Cranial Nerve Abnormalities Right central 7, Normal Sensory, Abnormal Motor 0/5 Right hand/wrist, 2- delt, bi,tri and Dysarthric Musc/Skel: tenderness  to palpation RIght shoulder, pain with PROM, +impingegement R shoulder, no pain in R elbow ,wrist or hand Gen NAD   Assessment/Plan: 1. Functional deficits secondary to Left pontine infarct causing R hemiparesis which require 3+ hours per day of interdisciplinary therapy in a comprehensive inpatient rehab setting. Physiatrist is providing close team supervision and 24 hour management of active medical problems listed below. Physiatrist and rehab team continue to assess barriers to discharge/monitor patient progress toward functional and  medical goals. FIM: FIM - Bathing Bathing Steps Patient Completed: Chest, Right Arm, Abdomen, Front perineal area, Right upper leg, Left upper leg, Left lower leg (including foot), Right lower leg (including foot) Bathing: 4: Min-Patient completes 8-9 [id number] 10 parts or 75+ percent  FIM - Upper Body Dressing/Undressing Upper body dressing/undressing steps patient completed: Thread/unthread right bra strap, Thread/unthread left bra strap, Hook/unhook bra, Thread/unthread right sleeve of pullover shirt/dresss, Thread/unthread left sleeve of pullover shirt/dress, Put head through opening of pull over shirt/dress, Pull shirt over trunk Upper body dressing/undressing: 5: Set-up assist to: Obtain clothing/put away FIM - Lower Body Dressing/Undressing Lower body dressing/undressing steps patient completed: Thread/unthread right underwear leg, Thread/unthread left underwear leg, Thread/unthread right pants leg, Thread/unthread left pants leg, Don/Doff right sock, Don/Doff left sock, Don/Doff right shoe, Don/Doff left shoe Lower body dressing/undressing: 3: Mod-Patient completed 50-74% of tasks  FIM - Toileting Toileting steps completed by patient: Performs perineal hygiene Toileting Assistive Devices: Grab bar or rail for support Toileting: 2: Max-Patient completed 1 of 3 steps  FIM - Diplomatic Services operational officerToilet Transfers Toilet Transfers Assistive Devices: Grab bars Toilet Transfers: 4-From toilet/BSC: Min A (steadying Pt. > 75%), 4-To toilet/BSC: Min A (steadying Pt. > 75%)  FIM - Bed/Chair Transfer Bed/Chair Transfer Assistive Devices: Arm rests, Bed rails Bed/Chair Transfer: 5: Supine > Sit: Supervision (verbal cues/safety issues), 4: Bed > Chair or W/C: Min A (steadying Pt. > 75%), 4: Chair or W/C > Bed: Min A (steadying Pt. > 75%)  FIM - Locomotion: Wheelchair Distance: 150 Locomotion: Wheelchair: 5: Travels 150 ft or more: maneuvers on rugs and over door sills with supervision, cueing or coaxing FIM - Locomotion:  Ambulation Locomotion: Ambulation Assistive Devices: Environmental consultantWalker - Hemi, Occupational hygienistCane - Quad Ambulation/Gait Assistance: 4: Min assist, 3:  Mod assist Locomotion: Ambulation: 2: Travels 50 - 149 ft with moderate assistance (Pt: 50 - 74%)  Comprehension Comprehension Mode: Auditory Comprehension: 7-Follows complex conversation/direction: With no assist  Expression Expression Mode: Verbal Expression: 5-Expresses complex 90% of the time/cues < 10% of the time  Social Interaction Social Interaction: 5-Interacts appropriately 90% of the time - Needs monitoring or encouragement for participation or interaction.  Problem Solving Problem Solving: 5-Solves basic 90% of the time/requires cueing < 10% of the time  Memory Memory: 6-Assistive device: No helper  Medical Problem List and Plan: 1. Functional deficits secondary to Left pontine infarct.   2.  DVT Prophylaxis/Anticoagulation: Pharmaceutical: Lovenox 3. Pain Management: Oxycodone prn for right shoulder pain. Ice as well as elevation for symptom management.   4. Mood: Reports of anxiety with adjustment reaction. Team to provide ego support. LCSW to follow for evaluation and support. Will consult chaplain services for follow up visit.   5. Neuropsych: This patient is capable of making decisions on her own behalf. 6. Skin/Wound Care: Routine pressure relief measures.   7. Fluids/Electrolytes/Nutrition: Monitor I/O. Offer nutritonal supplements as needed if intake poor. Husband encouraged to bring food from home.   8. HTN: Monitor every 8 hours. Off prinizide--avoid hypotension to allow adequate perfusion.   9. Right shoulder pain: likely due to RTC pathology: Negative X rays , voltaren gel, KPad -exercises with PT -Right subacromial injection for pain relief 10. Right Thyroid nodules/Parotid nodule: Follow up with ENT on outpatient basis 11. Hyperlipidemia: unable to tolerate statins.    LOS (Days) 5 A FACE TO FACE EVALUATION WAS  PERFORMED  KIRSTEINS,ANDREW E 10/24/2014, 7:55 AM

## 2014-10-24 NOTE — Plan of Care (Signed)
Problem: RH BLADDER ELIMINATION Goal: RH STG MANAGE BLADDER WITH ASSISTANCE STG Manage Bladder With Min Assistance  Outcome: Progressing  Problem: RH PAIN MANAGEMENT Goal: RH STG PAIN MANAGED AT OR BELOW PT'S PAIN GOAL < 3 on a 0-10 pain scale.  Outcome: Progressing  Problem: RH SKIN INTEGRITY Goal: RH STG SKIN FREE OF INFECTION/BREAKDOWN Remain free from infection/breakdown while on rehab with min assist.  Outcome: Progressing  Problem: RH SAFETY Goal: RH STG DECREASED RISK OF FALL WITH ASSISTANCE STG Decreased Risk of Fall With BJ'sMin Assistance.  Outcome: Progressing

## 2014-10-24 NOTE — Progress Notes (Signed)
Patient requests all four side rails be up on the bed.  Explained to patient that all four side rails is considered a restraint but patient continues to request all rails on the bed be up.  Will continue to monitor.

## 2014-10-24 NOTE — Progress Notes (Signed)
Speech Language Pathology Daily Session Note  Patient Details  Name: Linus OrnFrances H Mintz-Whitcomb MRN: [id number] Date of Birth: 12/30/1940  Today's Date: 10/24/2014 SLP Individual Time: 1430-1505 SLP Individual Time Calculation (min): 35 min  Short Term Goals: Week 1: SLP Short Term Goal 1 (Week 1): Patient will produce speech at the sentence level during structured tasks with Min verbal cues to utilize intelligibility strategies SLP Short Term Goal 2 (Week 1): Patient will verbalize safety precautions with Supervision level question cues SLP Short Term Goal 3 (Week 1): Patient will utilize retrieval strategies with new information and during moments of reported anomia with Min verbal cues  Skilled Therapeutic Interventions: Skilled treatment session focused on cognitive and speech goals. Upon arrival, patient was awake while sitting upright in her wheelchair and was agreeable to participate in treatment session. SLP facilitated session by providing supervision verbal cues for use of intelligibility strategies during a structured sentence level verbal expression task. Patient also required Min A multimodal cues to self-monitor and correct impulsivity during wheelchair propulsion and transfers. Patient left supine in bed with bed alarm on and all needs within reach. Continue with current plan of care.    FIM:  Comprehension Comprehension Mode: Auditory Comprehension: 6-Follows complex conversation/direction: With extra time/assistive device Expression Expression Mode: Verbal Expression: 5-Expresses complex 90% of the time/cues < 10% of the time Social Interaction Social Interaction: 5-Interacts appropriately 90% of the time - Needs monitoring or encouragement for participation or interaction. Problem Solving Problem Solving: 5-Solves basic 90% of the time/requires cueing < 10% of the time Memory Memory: 5-Recognizes or recalls 90% of the time/requires cueing < 10% of the time  Pain Pain  Assessment Pain Assessment: No/denies pain  Therapy/Group: Individual Therapy  Ivonne Freeburg 10/24/2014, 3:26 PM

## 2014-10-24 NOTE — Progress Notes (Signed)
Increase complain of pain to right shoulder. PRN Oxy ir given 3 times in past 12 hours. May need to add a med for break through pain. Alfredo MartinezMurray, Dyllen Menning A

## 2014-10-25 ENCOUNTER — Inpatient Hospital Stay (HOSPITAL_COMMUNITY): Payer: Medicare PPO | Admitting: Speech Pathology

## 2014-10-25 ENCOUNTER — Inpatient Hospital Stay (HOSPITAL_COMMUNITY): Payer: Medicare PPO

## 2014-10-25 ENCOUNTER — Inpatient Hospital Stay (HOSPITAL_COMMUNITY): Payer: Medicare PPO | Admitting: Physical Therapy

## 2014-10-25 ENCOUNTER — Inpatient Hospital Stay (HOSPITAL_COMMUNITY): Payer: Medicare PPO | Admitting: *Deleted

## 2014-10-25 NOTE — Plan of Care (Signed)
Problem: RH BLADDER ELIMINATION Goal: RH STG MANAGE BLADDER WITH ASSISTANCE STG Manage Bladder With Assistance. Mod I  Outcome: Progressing     

## 2014-10-25 NOTE — Progress Notes (Signed)
73 y.o. RH-female with history of HTN, hyperlipidemia who was admitted on 10/14/14 with acute onset of slurred speech and right sided weakness. Patient with sputtering symptoms and was treated with tPA due to recurrent nature of symptoms. CTA head/neck with 50% stenosis of R-ICA with luminal irregularity of bilateral ICA question fibromuscular dysplasia, severe calcific atherosclerosis of the carotid siphon and mild luminal irregularity of VA with extrinsic compression due to cervical spine degenerative changes. MRI/MRA brain done revealing acute left pontine infarct with lobular mass right parotid gland  Subjective/Complaints: Right shoulder pain,improved after injection slept well Review of Systems - Negative except Right side weak  Objective: Vital Signs: Blood pressure 137/67, pulse 66, temperature 98.3 F (36.8 C), temperature source Oral, resp. rate 18, height 5\' 6"  (1.676 m), weight 64.4 kg (141 lb 15.6 oz), SpO2 94 %. No results found. No results found for this or any previous visit (from the past 72 hour(s)).   HEENT: fair dentition Cardio: RRR and no murmur Resp: CTA B/L and unlabored GI: BS positive and NT, ND Extremity:  Pulses positive and No Edema Skin:   Intact Neuro: Alert/Oriented, Cranial Nerve Abnormalities Right central 7, Normal Sensory, Abnormal Motor 2-/5 Right hand/wrist, 3- delt, bi,tri and Dysarthric, 4/5 RLE, except 3- in foot/ankle Musc/Skel: No tenderness  to palpation RIght shoulder, pain with PROM, +impingement R shoulder, no pain in R elbow ,wrist or hand  Gen NAD   Assessment/Plan: 1. Functional deficits secondary to Left pontine infarct causing R hemiparesis which require 3+ hours per day of interdisciplinary therapy in a comprehensive inpatient rehab setting. Physiatrist is providing close team supervision and 24 hour management of active medical problems listed below. Physiatrist and rehab team continue to assess barriers to discharge/monitor patient  progress toward functional and medical goals. FIM: FIM - Bathing Bathing Steps Patient Completed: Chest, Right Arm, Abdomen, Front perineal area, Right upper leg, Left upper leg, Left lower leg (including foot), Right lower leg (including foot), Left Arm Bathing: 4: Min-Patient completes 8-9 [id number] 10 parts or 75+ percent  FIM - Upper Body Dressing/Undressing Upper body dressing/undressing steps patient completed: Thread/unthread right bra strap, Thread/unthread left bra strap, Thread/unthread right sleeve of pullover shirt/dresss, Thread/unthread left sleeve of pullover shirt/dress, Put head through opening of pull over shirt/dress, Pull shirt over trunk Upper body dressing/undressing: 5: Set-up assist to: Obtain clothing/put away FIM - Lower Body Dressing/Undressing Lower body dressing/undressing steps patient completed: Thread/unthread right underwear leg, Thread/unthread left underwear leg, Thread/unthread right pants leg, Thread/unthread left pants leg, Don/Doff right sock, Don/Doff left sock, Don/Doff left shoe, Pull underwear up/down Lower body dressing/undressing: 3: Mod-Patient completed 50-74% of tasks  FIM - Toileting Toileting steps completed by patient: Performs perineal hygiene Toileting Assistive Devices: Grab bar or rail for support Toileting: 2: Max-Patient completed 1 of 3 steps  FIM - Diplomatic Services operational officerToilet Transfers Toilet Transfers Assistive Devices: Grab bars Toilet Transfers: 4-From toilet/BSC: Min A (steadying Pt. > 75%), 4-To toilet/BSC: Min A (steadying Pt. > 75%)  FIM - Bed/Chair Transfer Bed/Chair Transfer Assistive Devices: Arm rests, Bed rails Bed/Chair Transfer: 5: Supine > Sit: Supervision (verbal cues/safety issues), 4: Bed > Chair or W/C: Min A (steadying Pt. > 75%)  FIM - Locomotion: Wheelchair Distance: 150 Locomotion: Wheelchair: 5: Travels 150 ft or more: maneuvers on rugs and over door sills with supervision, cueing or coaxing FIM - Locomotion: Ambulation Locomotion:  Ambulation Assistive Devices: Environmental consultantWalker - Hemi, Occupational hygienistCane - Quad Ambulation/Gait Assistance: 4: Min assist, 3: Mod assist Locomotion: Ambulation: 1: Travels less  than 50 ft with moderate assistance (Pt: 50 - 74%)  Comprehension Comprehension Mode: Auditory Comprehension: 6-Follows complex conversation/direction: With extra time/assistive device  Expression Expression Mode: Verbal Expression: 5-Expresses complex 90% of the time/cues < 10% of the time  Social Interaction Social Interaction: 5-Interacts appropriately 90% of the time - Needs monitoring or encouragement for participation or interaction.  Problem Solving Problem Solving: 5-Solves basic 90% of the time/requires cueing < 10% of the time  Memory Memory: 4-Recognizes or recalls 75 - 89% of the time/requires cueing 10 - 24% of the time  Medical Problem List and Plan: 1. Functional deficits secondary to Left pontine infarct.   2.  DVT Prophylaxis/Anticoagulation: Pharmaceutical: Lovenox 3. Pain Management: Oxycodone prn for right shoulder pain. Ice as well as elevation for symptom management.   4. Mood: Reports of anxiety with adjustment reaction. Team to provide ego support. LCSW to follow for evaluation and support. Will consult chaplain services for follow up visit.   5. Neuropsych: This patient is capable of making decisions on her own behalf. 6. Skin/Wound Care: Routine pressure relief measures.   7. Fluids/Electrolytes/Nutrition: Monitor I/O. Offer nutritonal supplements as needed if intake poor. Husband encouraged to bring food from home.   8. HTN: Monitor every 8 hours. Off prinizide--avoid hypotension to allow adequate perfusion.   9. Right shoulder pain: likely due to RTC pathology: Negative X rays , voltaren gel, KPad S/p RIght subacromial injection on 12/7 12mg  celestone with 3cc 2% lidocaine Pt with reduced pain since then -exercises with PT  10. Right Thyroid nodules/Parotid nodule: Follow up with ENT on outpatient  basis 11. Hyperlipidemia: unable to tolerate statins.    LOS (Days) 6 A FACE TO FACE EVALUATION WAS PERFORMED  KIRSTEINS,ANDREW E 10/25/2014, 8:33 AM

## 2014-10-25 NOTE — Progress Notes (Signed)
Speech Language Pathology Daily Session Note  Patient Details  Name: Teresa Martinez MRN: [id number] Date of Birth: 19-Jan-1941  Today's Date: 10/25/2014 SLP Individual Time: 1305-1407 SLP Individual Time Calculation (min): 62 min  Short Term Goals: Week 1: SLP Short Term Goal 1 (Week 1): Patient will produce speech at the sentence level during structured tasks with Min verbal cues to utilize intelligibility strategies SLP Short Term Goal 2 (Week 1): Patient will verbalize safety precautions with Supervision level question cues SLP Short Term Goal 3 (Week 1): Patient will utilize retrieval strategies with new information and during moments of reported anomia with Min verbal cues SLP Short Term Goal 3 - Progress (Week 1): Met  Skilled Therapeutic Interventions: Skilled treatment session focused on cognitive and speech intelligibility goals. SLP facilitated session by providing Supervision verbal cues for use of intelligibility strategies during a structured sentence-conversational level verbal expression task.  SLP also facilitated session with education regarding use of visualization, description and gestures for moments of reported word finding difficulty, which patient was able to then demonstrate Mod I. Patient was able to independently label safety precautions but required Supervision multimodal cues to self-monitor and correct impulsivity during basic functional tranfers. Continue with current plan of care.    FIM:  Comprehension Comprehension Mode: Auditory Comprehension: 6-Follows complex conversation/direction: With extra time/assistive device Expression Expression Mode: Verbal Expression: 5-Expresses complex 90% of the time/cues < 10% of the time Social Interaction Social Interaction: 6-Interacts appropriately with others with medication or extra time (anti-anxiety, antidepressant). Problem Solving Problem Solving: 5-Solves basic 90% of the time/requires cueing < 10% of the  time Memory Memory: 6-Assistive device: No helper FIM - Eating Eating Activity: 5: Set-up assist for open containers  Pain Pain Assessment Pain Assessment: No/denies pain  Therapy/Group: Individual Therapy  Teresa Martinez., [place] [phone]  Teresa Martinez 10/25/2014, 4:25 PM

## 2014-10-25 NOTE — Plan of Care (Signed)
Problem: RH SAFETY Goal: RH STG DECREASED RISK OF FALL WITH ASSISTANCE STG Decreased Risk of Fall With Min Assistance.  Outcome: Progressing     

## 2014-10-25 NOTE — Progress Notes (Signed)
Occupational Therapy Session Note  Patient Details  Name: Teresa OrnFrances H Mintz-Whitcomb MRN: [id number] Date of Birth: 06/08/1941  Today's Date: 10/25/2014 OT Individual Time: 1100-1200 OT Individual Time Calculation (min): 60 min    Short Term Goals: Week 1:  OT Short Term Goal 1 (Week 1): Pt will perform LB dressing with Mod A in order to decrease A with self care. OT Short Term Goal 2 (Week 1): Pt will perform bathing with Min A and use of bath mitt on R UE in order to increase independence and incorporate R UE into functional task. OT Short Term Goal 3 (Week 1): Pt will perform shower transfer with Min A stand pivot transfer onto TTB. OT Short Term Goal 4 (Week 1): Pt will perform UB dressing with Min A in order to increase I in self care.   Skilled Therapeutic Interventions/Progress Updates:    Pt engaged in BADL retraining including bathing at shower level, dressing with sit<>stand from w/c, shower transfers, toilet transfers, and toileting.  Pt required max verbal cues for safety awareness.  Pt stood X 4 without notifying therapist.  Educated patient on importance of not not standing without assistance.  Pt required min verbal cues to complete tasks without assistance.  Pt required min A for stand pivot transfers and steady A for standing balance.  Focus on activity tolerance, sit<>stand, standing balance, transfers, RUE use, attention to R, and safety awareness.  Therapy Documentation Precautions:  Precautions Precautions: Fall Precaution Comments: R hemiparesis, impulsive, R lateral lean Restrictions Weight Bearing Restrictions: No   Pain: Pain Assessment Pain Assessment: No/denies pain See FIM for current functional status  Therapy/Group: Individual Therapy  Rich BraveLanier, Dru Laurel Chappell 10/25/2014, 12:07 PM

## 2014-10-25 NOTE — Plan of Care (Signed)
Problem: RH PAIN MANAGEMENT Goal: RH STG PAIN MANAGED AT OR BELOW PT'S PAIN GOAL < 3 on a 0-10 pain scale.  Outcome: Progressing Did not require prn pain med after shoulder injection.wbb

## 2014-10-25 NOTE — Progress Notes (Signed)
Physical Therapy Session Note  Patient Details  Name: Teresa Martinez MRN: [id number] Date of Birth: 03/27/1941  Today's Date: 10/25/2014 PT Individual Time: 0830-0930 PT Individual Time Calculation (min): 60 min   Short Term Goals: Week 1:  PT Short Term Goal 1 (Week 1): Pt will perform dynamic standing balance with min A  PT Short Term Goal 2 (Week 1): Pt will perform stand pivot transfers with min A level  PT Short Term Goal 3 (Week 1): Pt will self propel w/c using L hemi technique x 150' at mod I level  PT Short Term Goal 4 (Week 1): Pt will ambulate x 50' w/ LRAD at min A level   Skilled Therapeutic Interventions/Progress Updates:   Pt received semi reclined in bed, agreeable to therapy. Pt transferred supine > sit with HOB flat and no rail and donned L shoe with supervision, assist to don R shoe/AFO. Pt ambulated to bathroom and to sink using LBQC with min A and verbal cues for safety and pacing. Pt performed toilet transfer and hygiene with supervision and clothing management with min A for standing balance. Pt propelled w/c using BLE and LUE x 150 ft with supervision. Gait training using LBQC x 100 ft with close supervision-min A and up to mod-max A with LOB to L and R. Focus on sequencing cane with step-through pattern with decreasing R step length and upright posture. Pt requires cues to keep cane on ground before stepping and decreasing pace to improve balance. Patient performed sit <> stand without UE support 2 x 15 from mat table with focus on anterior weight shift with standing and eccentric control with sitting and keeping feet equal as pt tends to compensate with LLE behind RLE. Pt negotiated up/down 5 stairs using L rail x 3 with focus on safety and control of movement, min A progressed to close supervision. Pt requires cues for safe sequencing and 1 episode of total A for balance attempting to step down with RLE while LLE was >12 inches away from step. Pt self-electing to  perform gait without AD and close supervision-mod A. Pt reverting to increased R step length and forward flexed posture, cues for technique above. Pt performed tapping to numbers on floor with R HHA using LLE for R LE NMR and balance reactions. Pt heavily reliant on UE support to perform task but did better with balance with UE support removed and min guard. Pt returned to room and left sitting in w/c with quick release belt on and all needs within reach.   Therapy Documentation Precautions:  Precautions Precautions: Fall Precaution Comments: R hemiparesis, impulsive, R lateral lean Restrictions Weight Bearing Restrictions: No  Pain: Pain Assessment Pain Assessment: No/denies pain  See FIM for current functional status  Therapy/Group: Individual Therapy  Kerney ElbeVarner, Mileydi Milsap A 10/25/2014, 9:38 AM

## 2014-10-25 NOTE — Plan of Care (Signed)
Problem: RH SKIN INTEGRITY Goal: RH STG SKIN FREE OF INFECTION/BREAKDOWN Remain free from infection/breakdown while on rehab with min assist.  Outcome: Progressing

## 2014-10-25 NOTE — Plan of Care (Signed)
Problem: RH SAFETY Goal: RH STG DECREASED RISK OF FALL WITH ASSISTANCE STG Decreased Risk of Fall With Assistance. Mod I  Outcome: Progressing     

## 2014-10-25 NOTE — Plan of Care (Signed)
Problem: RH PAIN MANAGEMENT Goal: RH STG PAIN MANAGED AT OR BELOW PT'S PAIN GOAL < 3 on a 0-10 pain scale.  Outcome: Progressing Pain managed with Voltaren Gel

## 2014-10-25 NOTE — Progress Notes (Signed)
Recreational Therapy Assessment and Plan  Patient Details  Name: Teresa Martinez MRN: [id number] Date of Birth: Jan 16, 1941 Today's Date: 10/25/2014  Rehab Potential: Good ELOS: 2 weeks   Assessment Clinical Impression: Problem List:  Patient Active Problem List    Diagnosis  Date Noted   .  Right rotator cuff tendonitis  10/19/2014   .  Stenosis of cervical spine region    .  Chronic right shoulder pain    .  Cerebral infarction due to thrombosis of left middle cerebral artery    .  Left pontine stroke    .  TIA (transient ischemic attack)  10/14/2014   .  Hypokalemia  10/14/2014   .  Overactive bladder  10/14/2014   .  CVA (cerebral infarction)  10/14/2014   .  URI  11/26/2010   .  NECK PAIN, CHRONIC  10/29/2010   .  Essential hypertension  10/10/2009   .  INSOMNIA  09/15/2009   .  Pure hypercholesterolemia  11/28/2008   .  MENOPAUSAL SYNDROME  11/28/2008    Past Medical History:  Past Medical History   Diagnosis  Date   .  Hyperlipemia    .  Statin intolerance    .  Menopausal syndrome      [name] [place])   .  Hypertension    .  Overactive bladder    .  Cervical spine degeneration      C5/C6   .  Chronic right shoulder pain     Past Surgical History:  Past Surgical History   Procedure  Laterality  Date   .  Cholecystectomy       1950   .  Band hemorrhoidectomy       2010   .  Parotidectomy       30 years ago   .  Abdominal hysterectomy       1985    Assessment & Plan  Clinical Impression: Patient is a 73 y.o. year old female with history of HTN, hyperlipidemia who was admitted on 10/14/14 with acute onset of slurred speech and right sided weakness. Patient with sputtering symptoms and was treated with tPA due to recurrent nature of symptoms. CTA head/neck with 50% stenosis of R-ICA with luminal irregularity of bilateral ICA question fibromuscular dysplasia, severe calcific atherosclerosis of the carotid siphon and mild luminal irregularity of VA  with extrinsic compression due to cervical spine degenerative changes. MRI/MRA brain done revealing acute left pontine infarct with lobular mass right parotid gland. CT cervical spine with cervical spondylosis C5/C6. CT Neck with right parotid mass and two right thyroid nodules without adenopathy. CT abdomen/pelvis without significant abnormality. 2D echo with EF 55-60& with no wall abnormality but grade 2 diastolic dysfunction and redundancy of atrial septum with borderline criteria for aneurysm. Patient was started on ASA for secondary stroke prevention. She had worsening of symptoms on 12/1 and was treated with bedrest as well as IVF. She continues to have significant pain RUE as well as difficulty with RLE control. Patient with resultant right hemiparesis, balance deficits, dysarthria. CIR was recommended by MD and rehab team and patient admitted today. Patient transferred to CIR on 10/19/2014.   Pt presents with decreased activity tolerance, decreased functional mobility, decreased balance, decreased coordination, decreased attention, decreased problem solving, decreased memory, decreased awarenss Limiting pt's independence with leisure/community pursuits.   Psychosocial / Spiritual Social interaction - Mood/Behavior: Impulsive Recreational Therapy Orientation Orientation -Reviewed with patient: Available activity resources Strengths/Weaknesses Patient Strengths/Abilities: Willingness to  participate;Active premorbidly Patient weaknesses: Physical limitations TR Patient demonstrates impairments in the following area(s): Endurance;Motor;Perception;Safety;Skin Integrity  Plan Rec Therapy Plan Is patient appropriate for Therapeutic Recreation?: Yes Rehab Potential: Good Treatment times per week: Min 1 time per week >20 minutes Estimated Length of Stay: 2 weeks TR Treatment/Interventions: Adaptive equipment instruction;1:1 session;Balance/vestibular training;Functional mobility training;Community  reintegration;Cognitive remediation/compensation;Patient/family education;Provide activity resources in room;Recreation/leisure participation;Therapeutic activities;Therapeutic exercise;UE/LE Coordination activities Recommendations for other services: Neuropsych  Recommendations for other services: Neuropsych  Discharge Criteria: Patient will be discharged from TR if patient refuses treatment 3 consecutive times without medical reason.  If treatment goals not met, if there is a change in medical status, if patient makes no progress towards goals or if patient is discharged from hospital.  The above assessment, treatment plan, treatment alternatives and goals were discussed and mutually agreed upon: by patient  [place] 10/25/2014, 4:53 PM

## 2014-10-25 NOTE — Plan of Care (Signed)
Problem: RH BLADDER ELIMINATION Goal: RH STG MANAGE BLADDER WITH ASSISTANCE STG Manage Bladder With Min Assistance  Outcome: Progressing     

## 2014-10-26 ENCOUNTER — Ambulatory Visit (HOSPITAL_COMMUNITY): Payer: Medicare PPO | Admitting: Rehabilitation

## 2014-10-26 ENCOUNTER — Inpatient Hospital Stay (HOSPITAL_COMMUNITY): Payer: Medicare PPO | Admitting: Speech Pathology

## 2014-10-26 ENCOUNTER — Inpatient Hospital Stay (HOSPITAL_COMMUNITY): Payer: Medicare PPO | Admitting: Occupational Therapy

## 2014-10-26 DIAGNOSIS — G8190 Hemiplegia, unspecified affecting unspecified side: Secondary | ICD-10-CM

## 2014-10-26 LAB — CREATININE, SERUM
Creatinine, Ser: 0.77 mg/dL (ref 0.50–1.10)
GFR calc Af Amer: >90 mL/min (ref >90)
GFR calc non Af Amer: 80 mL/min — ABNORMAL LOW (ref >90)

## 2014-10-26 NOTE — Progress Notes (Signed)
Chaplain followed up with pt. Pt requested for chaplain to read Bible to her. Chaplain paged away. Pt would like another visit tomorrow. Chaplain will continue to follow.   10/26/14 1000  Clinical Encounter Type  Visited With Patient  Visit Type Follow-up;Spiritual support  Spiritual Encounters  Spiritual Needs Sacred text  Jiles HaroldStamey, Teryn Gust F, Chaplain 10/26/2014 10:12 AM

## 2014-10-26 NOTE — Progress Notes (Signed)
Orthopedic Tech Progress Note Patient Details:  Teresa Martinez 01/04/1941 [id number]  Patient ID: Teresa Martinez, female   DOB: 07/11/1941, 73 y.o.   MRN: [id number] Called in advanced brace order; spoke with Christin BachJulia  Nyjah Schwake 10/26/2014, 11:42 AM

## 2014-10-26 NOTE — Progress Notes (Signed)
Occupational Therapy Session Note  Patient Details  Name: Teresa Martinez MRN: [id number] Date of Birth: 12/18/1940  Today's Date: 10/26/2014 OT Individual Time: [id number] OT Individual Time Calculation (min): 60 min    Short Term Goals: Week 1:  OT Short Term Goal 1 (Week 1): Pt will perform LB dressing with Mod A in order to decrease A with self care. OT Short Term Goal 2 (Week 1): Pt will perform bathing with Min A and use of bath mitt on R UE in order to increase independence and incorporate R UE into functional task. OT Short Term Goal 3 (Week 1): Pt will perform shower transfer with Min A stand pivot transfer onto TTB. OT Short Term Goal 4 (Week 1): Pt will perform UB dressing with Min A in order to increase I in self care.   Skilled Therapeutic Interventions/Progress Updates:  Upon entering the room, pt supine in bed with no c/o pain this session. Skilled OT session with focus on self care, functional transfers/mobility, STS, safety awareness, and pt education. Pt ambulated with use of quad cane to obtain clothing for dressing with Min A. Pt with LOB x 2 and unable to self correct requiring Mod A from therapist to right self. Pt performed toileting and toilet transfer with steady assist. Pt transferred to TTB with steady assist, use of quad cane, and grab bar. Pt independently donned bath mitt onto R hand. Pt incorporated  R UE to wash chest, abdomen, L arm , and parts of upper legs. Pt with active movement against gravity in all planes with R UE at diminished level for self care task with increased encouragement. Pt performed dressing seated in wheelchair at sink side with min verbal cues to lock wheelchair breaks prior to standing. Pt seated in wheelchair with arm tray and breakfast tray set up. Call bell within reach upon exiting the room.   Therapy Documentation Precautions:  Precautions Precautions: Fall Precaution Comments: R hemiparesis, impulsive, R lateral  lean Restrictions Weight Bearing Restrictions: No Pain: Pain Assessment Pain Assessment: No/denies pain Pain Score: 0-No pain  See FIM for current functional status  Therapy/Group: Individual Therapy  Lowella Gripittman, Jaretzi Droz L 10/26/2014, 11:12 AM

## 2014-10-26 NOTE — Progress Notes (Signed)
Recreational Therapy Session Note  Patient Details  Name: Teresa Martinez MRN: [id number] Date of Birth: 05/19/1941 Today's Date: 10/26/2014  Pain: no c/o Skilled Therapeutic Interventions/Progress Updates: Session focused on activity tolerance, dynamic balance, WBing through RUE & RLE while in quadriped.  In quadriped, pt reached for items to increase WBing through R side with min-mod assist.  Pt ambulated without assistive device back to room from therapy gym with min-mod assist.  Therapy/Group: Co-Treatment   Banjamin Stovall 10/26/2014, 4:37 PM

## 2014-10-26 NOTE — Progress Notes (Signed)
Physical Therapy Session Note  Patient Details  Name: Teresa Martinez MRN: [id number] Date of Birth: 07/08/1941  Today's Date: 10/26/2014 PT Individual Time: [id number] PT Individual Time Calculation (min): 60 min   Short Term Goals: Week 1:  PT Short Term Goal 1 (Week 1): Pt will perform dynamic standing balance with min A  PT Short Term Goal 2 (Week 1): Pt will perform stand pivot transfers with min A level  PT Short Term Goal 3 (Week 1): Pt will self propel w/c using L hemi technique x 150' at mod I level  PT Short Term Goal 4 (Week 1): Pt will ambulate x 50' w/ LRAD at min A level   Skilled Therapeutic Interventions/Progress Updates:   Pt received this morning in w/c in room, daughter present to address some banking issues, but did not attend session.  Pt then agreeable to therapy.  Ambulated to/from therapy gym without AD today at min/mod A level with R AFO in order to further challenge balance, weight shifting to the R, WB through RLE and having to alternate attention between gait and distractions in hallway.  Note that she still requires cues for safety and to decrease speed with cues for increased step length on LLE, and decreased step length on RLE for more normal gait pattern.  Continues to demonstrate poor balance strategies during gait, therefore intermittently requiring mod A.  Once in therapy gym, session focused on NMR for RLE/UE with weight shifting and WB through extremities.  Performed seated and standing kinetron, x 1 rep seated for 2 mins at moderate to max resistance progressing to two reps of standing x 50 steps with mirror placed for increased visual cues for upright posture.  Light facilitation at hips for glute activation and chest for upright posture.  Tolerated very well.  Then performed gait x 75' x 2 reps while kicking yoga block to challenge balance and increase weight shift and WB through RLE.  Requires min to mod A with max verbal cues for safety with slower  speed, esp when she needed to readjust block to middle of hallway.  Then worked on equal step lengths by ambulating forward on floor ladder.  Focused on each foot going into different block for more equal step lengths.  Pt requires cues to increase hip/knee flex on RLE, as well as larger step length on LLE to avoid dragging ladder.  RT then present to end session with quadruped activities while performing reaching task with LUE to targets for increased WB through RLE and UE.  Provided assist at hand with dysom for placement and assist at axilla for shoulder support and elbow for extension.   Tolerated well with cues for slower speed to maximize WB.  Pt ambulated back to room as stated above.  Left in w/c with nurse tech in room to assist to restroom.   Therapy Documentation Precautions:  Precautions Precautions: Fall Precaution Comments: R hemiparesis, impulsive, R lateral lean Restrictions Weight Bearing Restrictions: No  Pain: Pain Assessment Pain Assessment: No/denies pain Pain Score: 0-No pain   Locomotion : Ambulation Ambulation/Gait Assistance: 4: Min assist;3: Mod assist   See FIM for current functional status  Therapy/Group: Individual Therapy and cotx with RT  Vista Deckarcell, Tanara Turvey Ann 10/26/2014, 10:54 AM

## 2014-10-26 NOTE — Progress Notes (Signed)
Subjective/Complaints: Amb with PT using cane, spoke with pt daughter who works at [employer] - Negative except Right side weak  Objective: Vital Signs: Blood pressure 109/55, pulse 52, temperature 98.1 F (36.7 C), temperature source Oral, resp. rate 16, height '5\' 6"'  (1.676 m), weight 64.4 kg (141 lb 15.6 oz), SpO2 100 %. No results found. Results for orders placed or performed during the hospital encounter of 10/19/14 (from the past 72 hour(s))  Creatinine, serum     Status: Abnormal   Collection Time: 10/26/14  4:53 AM  Result Value Ref Range   Creatinine, Ser 0.77 0.50 - 1.10 mg/dL   GFR calc non Af Amer 80 (L) >90 mL/min   GFR calc Af Amer >90 >90 mL/min    Comment: (NOTE) The eGFR has been calculated using the CKD EPI equation. This calculation has not been validated in all clinical situations. eGFR's persistently <90 mL/min signify possible Chronic Kidney Disease.       Mood/affect bright Neuro: Alert/Oriented,romberg + cannot stand with feet together    hand/wrist, 3- delt, bi,tri and Dysarthric, 4/5 RLE, except 3- in foot/ankle Musc/Skel:  RIght shoulder, pain with AROM,abd Right shoulder, no pain in R elbow ,wrist or hand  Gen NAD   Assessment/Plan: 1. Functional deficits secondary to Left pontine infarct causing R hemiparesis which require 3+ hours per day of interdisciplinary therapy in a comprehensive inpatient rehab setting. Physiatrist is providing close team supervision and 24 hour management of active medical problems listed below. Physiatrist and rehab team continue to assess barriers to discharge/monitor patient progress toward functional and medical goals. Team conference today please see physician documentation under team conference tab, met with team face-to-face to discuss problems,progress, and goals. Formulized individual treatment plan based on medical history, underlying problem and comorbidities. FIM: FIM - Bathing Bathing  Steps Patient Completed: Chest, Right Arm, Abdomen, Front perineal area, Right upper leg, Left upper leg, Left lower leg (including foot), Right lower leg (including foot), Left Arm, Buttocks Bathing: 4: Steadying assist  FIM - Upper Body Dressing/Undressing Upper body dressing/undressing steps patient completed: Thread/unthread right bra strap, Thread/unthread left bra strap, Thread/unthread right sleeve of pullover shirt/dresss, Thread/unthread left sleeve of pullover shirt/dress, Put head through opening of pull over shirt/dress, Pull shirt over trunk Upper body dressing/undressing: 5: Set-up assist to: Obtain clothing/put away FIM - Lower Body Dressing/Undressing Lower body dressing/undressing steps patient completed: Thread/unthread right underwear leg, Thread/unthread left underwear leg, Thread/unthread right pants leg, Thread/unthread left pants leg, Don/Doff right sock, Don/Doff left sock, Don/Doff left shoe, Pull underwear up/down Lower body dressing/undressing: 3: Mod-Patient completed 50-74% of tasks  FIM - Toileting Toileting steps completed by patient: Performs perineal hygiene, Adjust clothing prior to toileting, Adjust clothing after toileting Toileting Assistive Devices: Grab bar or rail for support Toileting: 4: Steadying assist  FIM - Radio producer Devices: Grab bars Toilet Transfers: 4-From toilet/BSC: Min A (steadying Pt. > 75%), 4-To toilet/BSC: Min A (steadying Pt. > 75%)  FIM - Bed/Chair Transfer Bed/Chair Transfer Assistive Devices: Arm rests, Bed rails Bed/Chair Transfer: 5: Supine > Sit: Supervision (verbal cues/safety issues), 4: Bed > Chair or W/C: Min A (steadying Pt. > 75%), 4: Chair or W/C > Bed: Min A (steadying Pt. > 75%)  FIM - Locomotion: Wheelchair Distance: 150 Locomotion: Wheelchair: 5: Travels 150 ft or more: maneuvers on rugs and over door sills with supervision, cueing or coaxing FIM - Locomotion: Ambulation Locomotion:  Ambulation Assistive Devices: [employer],  Other (comment) (none) Ambulation/Gait Assistance: 4: Min assist, 3: Mod assist, 5: Supervision, 4: Min guard Locomotion: Ambulation: 2: Travels 50 - 149 ft with moderate assistance (Pt: 50 - 74%)  Comprehension Comprehension Mode: Auditory Comprehension: 6-Follows complex conversation/direction: With extra time/assistive device  Expression Expression Mode: Verbal Expression: 5-Expresses complex 90% of the time/cues < 10% of the time  Social Interaction Social Interaction: 6-Interacts appropriately with others with medication or extra time (anti-anxiety, antidepressant).  Problem Solving Problem Solving: 5-Solves basic 90% of the time/requires cueing < 10% of the time  Memory Memory: 6-Assistive device: No helper  Medical Problem List and Plan: 1. Functional deficits secondary to Left pontine infarct.   2.  DVT Prophylaxis/Anticoagulation: Pharmaceutical: Lovenox 3. Pain Management: Oxycodone prn for right shoulder pain. Ice as well as elevation for symptom management.   4. Mood: Reports of anxiety with adjustment reaction. Team to provide ego support. LCSW to follow for evaluation and support. Will consult chaplain services for follow up visit.   5. Neuropsych: This patient is capable of making decisions on her own behalf. 6. Skin/Wound Care: Routine pressure relief measures.   7. Fluids/Electrolytes/Nutrition: Monitor I/O. Offer nutritonal supplements as needed if intake poor. Husband encouraged to bring food from home.   8. HTN: Monitor every 8 hours. Off prinizide--avoid hypotension to allow adequate perfusion.   9. Right shoulder pain: likely due to RTC pathology: Negative X rays , voltaren gel, KPad S/p RIght subacromial injection on 12/7 58m celestone with 3cc 2% lidocaine Pt with reduced pain since then -exercises with PT  10. Right Thyroid nodules/Parotid nodule: Follow up with ENT on outpatient basis 11. Hyperlipidemia: unable  to tolerate statins.    LOS (Days) 7 A FACE TO FACE EVALUATION WAS PERFORMED  Travius Crochet E 10/26/2014, 9:13 AM

## 2014-10-26 NOTE — Progress Notes (Signed)
Speech Language Pathology Daily Session Note  Patient Details  Name: Teresa Martinez MRN: [id number] Date of Birth: 1941-09-22  Today's Date: 10/26/2014 SLP Individual Time: 1300-1400 SLP Individual Time Calculation (min): 60 min  Short Term Goals: Week 1: SLP Short Term Goal 1 (Week 1): Patient will produce speech at the sentence level during structured tasks with Min verbal cues to utilize intelligibility strategies SLP Short Term Goal 2 (Week 1): Patient will verbalize safety precautions with Supervision level question cues SLP Short Term Goal 3 (Week 1): Patient will utilize retrieval strategies with new information and during moments of reported anomia with Min verbal cues SLP Short Term Goal 3 - Progress (Week 1): Met SLP Short Term Goal 4 (Week 1): Patient will utilize retrieval strategies with new information and during moments of reported anomia with Mod I   Skilled Therapeutic Interventions: Skilled treatment session focused on addressing cognitive and speech goals. Patient required Supervision level verbal cues to utilize safety precautions with transfer from bed to chair.  SLP facilitated session with discussion and audio recording to emphasize moderate use of speech intelligibility compensatory strategies due to patient now sounding robotic due to such a slow pace and level of over articulation.  Following patient was able to monitor and moderate speech production at the conversational level with overall Supervision multimodal cues. Patient left in wheelchair, trial without quick release belt, call bell within reach and spouse present.  Continue with current plan of care.    FIM:  Comprehension Comprehension Mode: Auditory Comprehension: 6-Follows complex conversation/direction: With extra time/assistive device Expression Expression Mode: Verbal Expression: 5-Expresses complex 90% of the time/cues < 10% of the time Social Interaction Social Interaction: 5-Interacts  appropriately 90% of the time - Needs monitoring or encouragement for participation or interaction. Problem Solving Problem Solving: 5-Solves basic 90% of the time/requires cueing < 10% of the time Memory Memory: 6-More than reasonable amt of time  Pain Pain Assessment Pain Assessment: No/denies pain  Therapy/Group: Individual Therapy  Carmelia Roller., CCC-SLP [phone]  [place] 10/26/2014, 3:22 PM

## 2014-10-26 NOTE — ED Provider Notes (Signed)
CSN: [id number]     Arrival date & time 10/14/14  2039 History   First MD Initiated Contact with Patient 10/14/14 2223     Chief Complaint  Patient presents with  . Code Stroke     (Consider location/radiation/quality/duration/timing/severity/associated sxs/prior Treatment) HPI Comments: 73 y.o. female with a history of hypertension and hyperlipidemia presents with acute onset of slurred speech and inability to move right extremities at 7:30 PM tonight. She has no previous history of stroke nor TIA. Pt reports that her sx have improved over the course of few minutes, but en route, she started having weakness again and code stroke was called. Currently, pt has 4+/5 strength bilaterally. She is not slurring. Neuro team at bedside.  The history is provided by the patient.    Past Medical History  Diagnosis Date  . Hyperlipemia   . Statin intolerance   . Menopausal syndrome     [name])  . Hypertension   . Overactive bladder   . Cervical spine degeneration     C5/C6  . Chronic right shoulder pain    Past Surgical History  Procedure Laterality Date  . Cholecystectomy      1950  . Band hemorrhoidectomy      2010  . Parotidectomy      30 years ago  . Abdominal hysterectomy      1985   Family History  Problem Relation Age of Onset  . Heart failure Father   . Diabetes Mother   . Hypertension Mother   . Stroke Mother   . Cirrhosis Sister   . Hyperlipidemia Brother   . Hypertension Brother    History  Substance Use Topics  . Smoking status: Never Smoker   . Smokeless tobacco: Never Used  . Alcohol Use: No   OB History    No data available     Review of Systems  Constitutional: Positive for activity change.  Respiratory: Negative for shortness of breath.   Cardiovascular: Negative for chest pain.  Gastrointestinal: Negative for nausea, vomiting and abdominal pain.  Genitourinary: Negative for dysuria.  Musculoskeletal: Negative for neck pain.  Skin:  Negative for rash.  Neurological: Positive for facial asymmetry, speech difficulty, weakness and numbness. Negative for dizziness and headaches.  All other systems reviewed and are negative.     Allergies  Latex and Pravastatin  Home Medications   Prior to Admission medications   Medication Sig Start Date End Date Taking? Authorizing Provider  b complex vitamins tablet Take 1 tablet by mouth daily.   Yes Historical Provider, MD  Calcium Carb-Cholecalciferol (CALCIUM+D3) 600-800 MG-UNIT TABS Take 1 tablet by mouth daily.   Yes Historical Provider, MD  Cholecalciferol (VITAMIN D PO) Take 1 tablet by mouth daily.   Yes Historical Provider, MD  estradiol (ESTRACE) 0.1 MG/GM vaginal cream Place 1 Applicatorful vaginally once a week.   Yes Historical Provider, MD  fesoterodine (TOVIAZ) 4 MG TB24 tablet Take 1 tablet (4 mg total) by mouth daily. Patient taking differently: Take 4 mg by mouth daily as needed (urination frequency).  08/26/14  Yes Gordy Savers, MD  lisinopril-hydrochlorothiazide (PRINZIDE,ZESTORETIC) 20-12.5 MG per tablet Take 1 tablet by mouth daily. 08/26/14  Yes Gordy Savers, MD  loratadine (CLARITIN) 10 MG tablet Take 10 mg by mouth daily.   Yes Historical Provider, MD  Multiple Vitamin (MULTIVITAMIN WITH MINERALS) TABS tablet Take 1 tablet by mouth daily.   Yes Historical Provider, MD  naproxen sodium (ALEVE) 220 MG tablet Take 440 mg  by mouth daily as needed (pain/headaches).   Yes Historical Provider, MD  Omega-3 Fatty Acids (FISH OIL) 1000 MG CAPS Take 1,000 mg by mouth daily.   Yes Historical Provider, MD  pravastatin (PRAVACHOL) 20 MG tablet Take 1 tablet (20 mg total) by mouth daily. 08/26/14   Gordy SaversPeter F Kwiatkowski, MD   BP 137/74 mmHg  Pulse 61  Temp(Src) 98.4 F (36.9 C) (Oral)  Resp 18  Ht 5\' 6"  (1.676 m)  Wt 139 lb 5.3 oz (63.2 kg)  BMI 22.50 kg/m2  SpO2 98% Physical Exam  Constitutional: She is oriented to person, place, and time. She appears  well-developed and well-nourished.  HENT:  Head: Normocephalic and atraumatic.  Eyes: EOM are normal. Pupils are equal, round, and reactive to light.  Neck: Neck supple.  Cardiovascular: Normal rate, regular rhythm and normal heart sounds.   No murmur heard. Pulmonary/Chest: Effort normal. No respiratory distress.  Abdominal: Soft. She exhibits no distension. There is no tenderness. There is no rebound and no guarding.  Neurological: She is alert and oriented to person, place, and time. No cranial nerve deficit.  Skin: Skin is warm and dry.  Nursing note reviewed.   ED Course  Procedures (including critical care time) Labs Review Labs Reviewed  APTT - Abnormal; Notable for the following:    aPTT 23 (*)    All other components within normal limits  DIFFERENTIAL - Abnormal; Notable for the following:    Neutrophils Relative % 42 (*)    Lymphocytes Relative 49 (*)    All other components within normal limits  COMPREHENSIVE METABOLIC PANEL - Abnormal; Notable for the following:    Potassium 3.6 (*)    Glucose, Bld 184 (*)    Total Bilirubin <0.2 (*)    GFR calc non Af Amer 51 (*)    GFR calc Af Amer 59 (*)    Anion gap 18 (*)    All other components within normal limits  HEMOGLOBIN A1C - Abnormal; Notable for the following:    Hgb A1c MFr Bld 6.0 (*)    Mean Plasma Glucose 126 (*)    All other components within normal limits  LIPID PANEL - Abnormal; Notable for the following:    Cholesterol 215 (*)    Triglycerides 158 (*)    LDL Cholesterol 132 (*)    All other components within normal limits  BASIC METABOLIC PANEL - Abnormal; Notable for the following:    Glucose, Bld 121 (*)    Calcium 8.2 (*)    GFR calc non Af Amer 83 (*)    All other components within normal limits  CBG MONITORING, ED - Abnormal; Notable for the following:    Glucose-Capillary 123 (*)    All other components within normal limits  I-STAT CHEM 8, ED - Abnormal; Notable for the following:     Potassium 3.4 (*)    Glucose, Bld 188 (*)    All other components within normal limits  MRSA PCR SCREENING  PROTIME-INR  CBC  URINALYSIS, ROUTINE W REFLEX MICROSCOPIC  I-STAT TROPOININ, ED    Imaging Review No results found.   EKG Interpretation   Date/Time:  Friday October 14 2014 21:05:33 EST Ventricular Rate:  76 PR Interval:  179 QRS Duration: 83 QT Interval:  364 QTC Calculation: 409 R Axis:   67 Text Interpretation:  Sinus rhythm RAE, consider biatrial enlargement  Abnormal R-wave progression, early transition Borderline repolarization  abnormality Minimal ST elevation, anterior leads ED PHYSICIAN  INTERPRETATION  AVAILABLE IN [facility] Confirmed by TEST, Record  ([id number]) on 10/16/2014 8:48:35 AM      MDM   Final diagnoses:  Transient global amnesia  Acute Stroke  DDx includes: Stroke - ischemic vs. hemorrhagic TIA Neuropathy  Pt comes in with cc of R sided weakness. Asymptomatic during my brief evaluation - however, whilst i was with another patient - her sx returned. Dr. Effie ShyWentz quickly assessed the pt, and he did notify Dr. Roseanne RenoStewart, Neurology. Pt on my reassessment, did have increased weakness compared to before.  Neuro team reassessed, and wants TPA started for acute stroke management, and they will now admit the patient primarily.  CRITICAL CARE Performed by: Derwood KaplanNanavati, Ginnifer Creelman   Total critical care time: 45 min - acute stroke requiring TPA  Critical care time was exclusive of separately billable procedures and treating other patients.  Critical care was necessary to treat or prevent imminent or life-threatening deterioration.  Critical care was time spent personally by me on the following activities: development of treatment plan with patient and/or surrogate as well as nursing, discussions with consultants, evaluation of patient's response to treatment, examination of patient, obtaining history from patient or surrogate, ordering and performing  treatments and interventions, ordering and review of laboratory studies, ordering and review of radiographic studies, pulse oximetry and re-evaluation of patient's condition.    Derwood KaplanAnkit Shauntel Prest, MD 10/26/14 [phone]

## 2014-10-26 NOTE — Progress Notes (Signed)
Social Work Patient ID: Teresa Martinez, female   DOB: 06/22/1941, 73 y.o.   MRN: [id number] Met with pt and husband to discuss team conference goals-supervision level and discharge 12/15.  Discussed needs supervision due to impulsive and moves quickly which increases her risk of falling. She wants someone to move in and husband disagrees with this.  Have given them the private duty agency list to hire assistance for pt while husband works and is gone out of town on business. Pt states; " You can go now, I have some business to discuss with my husband."  Husband aware caregiver needs to come in and attend therapies with pt prior to discharge. 

## 2014-10-26 NOTE — Patient Care Conference (Signed)
Inpatient RehabilitationTeam Conference and Plan of Care Update Date: 10/26/2014   Time: 11;35 AM    Patient Name: Teresa SchaumannFrances H Mintz-Whitcomb      Medical Record Number: [id number]  Date of Birth: 09/15/1941 Sex: Female         Room/Bed: 4W22C/4W22C-01 Payor Info: Payor: [employer] MEDICARE / Plan: [employer] MEDICARE CHOICE PPO / Product Type: *No Product type* /    Admitting Diagnosis: l pontine cva with rt rot cuff issues  Admit Date/Time:  10/19/2014  4:41 PM Admission Comments: No comment available   Primary Diagnosis:  Left pontine stroke Principal Problem: Left pontine stroke  Patient Active Problem List   Diagnosis Date Noted  . Right rotator cuff tendonitis 10/19/2014  . Stenosis of cervical spine region   . Chronic right shoulder pain   . Cerebral infarction due to thrombosis of left middle cerebral artery   . Left pontine stroke   . TIA (transient ischemic attack) 10/14/2014  . Hypokalemia 10/14/2014  . Overactive bladder 10/14/2014  . CVA (cerebral infarction) 10/14/2014  . URI 11/26/2010  . NECK PAIN, CHRONIC 10/29/2010  . Essential hypertension 10/10/2009  . INSOMNIA 09/15/2009  . Pure hypercholesterolemia 11/28/2008  . MENOPAUSAL SYNDROME 11/28/2008    Expected Discharge Date: Expected Discharge Date: 11/01/14  Team Members Present: Physician leading conference: Dr. Claudette LawsAndrew Kirsteins Nurse Present: Ronny BaconWhitney Reardon, RN PT Present: Harriet ButteEmily Parcell, PT;Heston Widener Varner, PT OT Present: Callie FieldingKatie Pittman, Heath LarkT;James McGuire, OT SLP Present: Fae PippinMelissa Bowie, SLP PPS Coordinator present : Tora DuckMarie Noel, RN, CRRN     Current Status/Progress Goal Weekly Team Focus  Medical   ambulated with R foot drop, Right shoulder pain improved with injection but still impinging  Mod I/Sup home d/c  D/C planning, orthotic management   Bowel/Bladder   Continent of bowel, LBM 12/8; urgency with bladder  Continent of bowel and bladder  Offer toilet PRN; offer pads to help manage urgency    Swallow/Nutrition/ Hydration   WFL         ADL's   S for bed mobility, close S- min A for transfers to TTB and elevated toilet seat, Min A for self care tasks overall  Supervision  Pt education, functional transfers/mobility, self care, STS, increased use of R UE for functional tasks   Mobility   Pt is currently S for bed mobility, close S to min A for transfers and gait with LBQC, min A without AD, min A stairs  S overall  R NMR, pt/family education, safety awareness, gait, stairs   Communication   Supervision   Mod I   increase more natural speech production in more natural settings   Safety/Cognition/ Behavioral Observations  Min-Supervision   Supervision, downgraded  increase safety awareness   Pain   Pain in right shoulder with movement with voltaren gel in use- pain improved after injection on Monday from Dr. Wynn BankerKirsteins  </3  Monitor pain qshift and PRN   Skin   No skin issues noted  No new skin breakdown  Monitor skin qshift      *See Care Plan and progress notes for long and short-term goals.  Barriers to Discharge: see above    Possible Resolutions to Barriers:  cont rehab    Discharge Planning/Teaching Needs:  Home with husband who plans to take time off work to provide care to pt.       Team Discussion:  Pt very impulsive and has safety issues-goals changing to supervision level. R-shoulder pain-injected Monday. AFO today. Come up with a safe  discharge plan.  Revisions to Treatment Plan:  Downgrade goals-supervision level   Continued Need for Acute Rehabilitation Level of Care: The patient requires daily medical management by a physician with specialized training in physical medicine and rehabilitation for the following conditions: Daily direction of a multidisciplinary physical rehabilitation program to ensure safe treatment while eliciting the highest outcome that is of practical value to the patient.: Yes Daily medical management of patient stability for  increased activity during participation in an intensive rehabilitation regime.: Yes Daily analysis of laboratory values and/or radiology reports with any subsequent need for medication adjustment of medical intervention for : Neurological problems  Moustapha Tooker, Lemar LivingsRebecca G 10/27/2014, 8:40 AM

## 2014-10-26 NOTE — Plan of Care (Signed)
Problem: RH BLADDER ELIMINATION Goal: RH STG MANAGE BLADDER WITH ASSISTANCE STG Manage Bladder With Assistance. Mod I  Outcome: Progressing  Problem: RH PAIN MANAGEMENT Goal: RH STG PAIN MANAGED AT OR BELOW PT'S PAIN GOAL < 3 on a 0-10 pain scale.  Outcome: Progressing  Problem: RH SKIN INTEGRITY Goal: RH STG SKIN FREE OF INFECTION/BREAKDOWN Remain free from infection/breakdown while on rehab with assist. Mod I  Outcome: Progressing  Problem: RH SAFETY Goal: RH STG DECREASED RISK OF FALL WITH ASSISTANCE STG Decreased Risk of Fall With Assistance. Mod I  Outcome: Progressing     

## 2014-10-26 NOTE — Plan of Care (Signed)
Problem: RH BLADDER ELIMINATION Goal: RH STG MANAGE BLADDER WITH ASSISTANCE STG Manage Bladder With Assistance. Mod I  Outcome: Progressing  Problem: RH PAIN MANAGEMENT Goal: RH STG PAIN MANAGED AT OR BELOW PT'S PAIN GOAL < 3 on a 0-10 pain scale.  Outcome: Progressing

## 2014-10-27 ENCOUNTER — Inpatient Hospital Stay (HOSPITAL_COMMUNITY): Payer: Medicare PPO | Admitting: Speech Pathology

## 2014-10-27 ENCOUNTER — Inpatient Hospital Stay (HOSPITAL_COMMUNITY): Payer: Medicare PPO | Admitting: Occupational Therapy

## 2014-10-27 ENCOUNTER — Inpatient Hospital Stay (HOSPITAL_COMMUNITY): Payer: Medicare PPO

## 2014-10-27 NOTE — Progress Notes (Signed)
Occupational Therapy Session Note  Patient Details  Name: Teresa Martinez MRN: [id number] Date of Birth: 05/17/1941  Today's Date: 10/27/2014 OT Individual Time: 1102-1202 OT Individual Time Calculation (min): 60 min    Short Term Goals: Week 1:  OT Short Term Goal 1 (Week 1): Pt will perform LB dressing with Mod A in order to decrease A with self care. OT Short Term Goal 2 (Week 1): Pt will perform bathing with Min A and use of bath mitt on R UE in order to increase independence and incorporate R UE into functional task. OT Short Term Goal 3 (Week 1): Pt will perform shower transfer with Min A stand pivot transfer onto TTB. OT Short Term Goal 4 (Week 1): Pt will perform UB dressing with Min A in order to increase I in self care.   Skilled Therapeutic Interventions/Progress Updates:  Upon entering the room, pt seated in wheelchair with no c/o pain. Pt very upset secondary to change in therapy schedule and requiring cues/edu from therapist in order to calm down in order to participate safely and attend to tasks. OT session with focus on self care tasks, STS, dynamic standing balance, safety awareness, and coordination exercises for R hand. Pt ambulated with quad cane into bathroom with Min A for balance. Pt transferred onto TTB with Min A for bathing. Donned bathing mitt onto R hand independently and utilized R UE as much as possible for bathing tasks. Pt having difficulty washing L UE this session as pt reporting, "my arm is tired." Dressing performed while seated in wheelchair with close supervision for STS and close S - steady assist for dynamic standing balance during functional tasks. Pt engaged in finger isolation exercises and adduction/abduction of digits x 10 reps. Pt seated in wheelchair with lunch tray set up and QRB donned prior to exiting room.   Therapy Documentation Precautions:  Precautions Precautions: Fall Precaution Comments: R hemiparesis, impulsive, R lateral  lean Restrictions Weight Bearing Restrictions: No  See FIM for current functional status  Therapy/Group: Individual Therapy  Lowella Gripittman, Rhylee Pucillo L 10/27/2014, 4:15 PM

## 2014-10-27 NOTE — Progress Notes (Signed)
Subjective/Complaints: No shoulder pain interfering with sleep since injection Review of Systems - Negative except Right side weak  Objective: Vital Signs: Blood pressure 133/58, pulse 60, temperature 98.7 F (37.1 C), temperature source Oral, resp. rate 17, height 5' 6" (1.676 m), weight 63 kg (138 lb 14.2 oz), SpO2 93 %. No results found. Results for orders placed or performed during the hospital encounter of 10/19/14 (from the past 72 hour(s))  Creatinine, serum     Status: Abnormal   Collection Time: 10/26/14  4:53 AM  Result Value Ref Range   Creatinine, Ser 0.77 0.50 - 1.10 mg/dL   GFR calc non Af Amer 80 (L) >90 mL/min   GFR calc Af Amer >90 >90 mL/min    Comment: (NOTE) The eGFR has been calculated using the CKD EPI equation. This calculation has not been validated in all clinical situations. eGFR's persistently <90 mL/min signify possible Chronic Kidney Disease.       Mood/affect bright Neuro: Alert/Oriented,romberg + cannot stand with feet together    hand/wrist, 3- delt, bi,tri and Dysarthric, 4/5 RLE, except 3- in foot/ankle Musc/Skel:  RIght shoulder, pain with AROM,abd Right shoulder, no pain in R elbow ,wrist or hand Lungs clear Heart RRR no murmur abd soft non tender Ext No C/C/E  Gen NAD   Assessment/Plan: 1. Functional deficits secondary to Left pontine infarct causing R hemiparesis which require 3+ hours per day of interdisciplinary therapy in a comprehensive inpatient rehab setting. Physiatrist is providing close team supervision and 24 hour management of active medical problems listed below. Physiatrist and rehab team continue to assess barriers to discharge/monitor patient progress toward functional and medical goals.  FIM: FIM - Bathing Bathing Steps Patient Completed: Chest, Right Arm, Abdomen, Front perineal area, Buttocks, Right upper leg, Left upper leg, Right lower leg (including foot), Left lower leg (including foot), Left Arm Bathing: 4:  Steadying assist  FIM - Upper Body Dressing/Undressing Upper body dressing/undressing steps patient completed: Thread/unthread right sleeve of pullover shirt/dresss, Thread/unthread left sleeve of pullover shirt/dress, Put head through opening of pull over shirt/dress, Pull shirt over trunk Upper body dressing/undressing: 4: Steadying assist FIM - Lower Body Dressing/Undressing Lower body dressing/undressing steps patient completed: Thread/unthread right underwear leg, Thread/unthread left underwear leg, Thread/unthread right pants leg, Thread/unthread left pants leg, Don/Doff right sock, Don/Doff left sock, Don/Doff left shoe, Pull underwear up/down Lower body dressing/undressing: 4: Min-Patient completed 75 plus % of tasks  FIM - Toileting Toileting steps completed by patient: Performs perineal hygiene, Adjust clothing prior to toileting, Adjust clothing after toileting Toileting Assistive Devices: Grab bar or rail for support Toileting: 4: Steadying assist  FIM - Radio producer Devices: Grab bars Toilet Transfers: 4-From toilet/BSC: Min A (steadying Pt. > 75%), 4-To toilet/BSC: Min A (steadying Pt. > 75%)  FIM - Bed/Chair Transfer Bed/Chair Transfer Assistive Devices: Arm rests, Bed rails Bed/Chair Transfer: 5: Supine > Sit: Supervision (verbal cues/safety issues), 4: Bed > Chair or W/C: Min A (steadying Pt. > 75%)  FIM - Locomotion: Wheelchair Distance: 150 Locomotion: Wheelchair: 0: Activity did not occur FIM - Locomotion: Ambulation Locomotion: Ambulation Assistive Devices: Other (comment) (no AD) Ambulation/Gait Assistance: 4: Min assist, 3: Mod assist Locomotion: Ambulation: 3: Travels 150 ft or more with moderate assistance (Pt: 50 - 74%)  Comprehension Comprehension Mode: Auditory Comprehension: 6-Follows complex conversation/direction: With extra time/assistive device  Expression Expression Mode: Verbal Expression: 5-Expresses complex 90% of  the time/cues < 10% of the time  Social Interaction Social Interaction:  5-Interacts appropriately 90% of the time - Needs monitoring or encouragement for participation or interaction.  Problem Solving Problem Solving: 5-Solves basic 90% of the time/requires cueing < 10% of the time  Memory Memory: 6-More than reasonable amt of time  Medical Problem List and Plan: 1. Functional deficits secondary to Left pontine infarct.   2.  DVT Prophylaxis/Anticoagulation: Pharmaceutical: Lovenox 3. Pain Management: Oxycodone prn for right shoulder pain. Ice as well as elevation for symptom management.   4. Mood: Reports of anxiety with adjustment reaction. Team to provide ego support. LCSW to follow for evaluation and support. Will consult chaplain services for follow up visit.   5. Neuropsych: This patient is capable of making decisions on her own behalf. 6. Skin/Wound Care: Routine pressure relief measures.   7. Fluids/Electrolytes/Nutrition: Monitor I/O. Offer nutritonal supplements as needed if intake poor. Husband encouraged to bring food from home.   8. HTN: Monitor every 8 hours. Off prinizide--avoid hypotension to allow adequate perfusion.   9. Right shoulder pain: likely due to RTC pathology: Negative X rays , voltaren gel, KPad S/p RIght subacromial injection on 12/7 12mg celestone with 3cc 2% lidocaine Pt with reduced pain since then -exercises with PT  10. Right Thyroid nodules/Parotid nodule: Follow up with ENT on outpatient basis 11. Hyperlipidemia: unable to tolerate statins.    LOS (Days) 8 A FACE TO FACE EVALUATION WAS PERFORMED  KIRSTEINS,ANDREW E 10/27/2014, 7:37 AM    

## 2014-10-27 NOTE — Plan of Care (Signed)
Problem: RH SKIN INTEGRITY Goal: RH STG SKIN FREE OF INFECTION/BREAKDOWN Remain free from infection/breakdown while on rehab with assist. Mod I  Outcome: Progressing No skin breakdown

## 2014-10-27 NOTE — Plan of Care (Signed)
Problem: RH SAFETY Goal: RH STG DECREASED RISK OF FALL WITH ASSISTANCE STG Decreased Risk of Fall With Assistance. Supervision  Outcome: Progressing     

## 2014-10-27 NOTE — Progress Notes (Signed)
Occupational Therapy Weekly Progress Note  Patient Details  Name: Teresa Martinez MRN: [id number] Date of Birth: [birth year]  Beginning of progress report period: October 20, 2014 End of progress report period: October 28, 2014  Today's Date: 10/28/2014 OT Individual Time:  0802- 0758   60 minutes   Patient has met 4 of 4 short term goals. Pt has made significant progress this week towards long term and short term goals. Pt is able to perform STS with close supervision as well as performing LB dressing and hygiene this session. Bath mitt utilized on R hand, pt able to donn I, to incorporate dominant hand at a diminished level for self care. Pt continues to be very motivated. Safety awareness and impulsivity continues to be an issue especially during functional ambulation with quad cane as pt requires assistance to correct.  Patient continues to demonstrate the following deficits: safety awareness, dynamic standing balance, R UE strength and AROM, and standing tolerance and therefore will continue to benefit from skilled OT intervention to enhance overall performance with BADL and iADL.  Patient progressing toward long term goals..  Continue plan of care.  OT Short Term Goals Week 1:  OT Short Term Goal 1 (Week 1): Pt will perform LB dressing with Mod A in order to decrease A with self care. OT Short Term Goal 1 - Progress (Week 1): Met OT Short Term Goal 2 (Week 1): Pt will perform bathing with Min A and use of bath mitt on R UE in order to increase independence and incorporate R UE into functional task. OT Short Term Goal 2 - Progress (Week 1): Met OT Short Term Goal 3 (Week 1): Pt will perform shower transfer with Min A stand pivot transfer onto TTB. OT Short Term Goal 3 - Progress (Week 1): Met OT Short Term Goal 4 (Week 1): Pt will perform UB dressing with Min A in order to increase I in self care.  OT Short Term Goal 4 - Progress (Week 1): Met Week 2:  OT Short Term Goal 1  (Week 2): progress towards LTGs as pt discharge scheduled for 12/15  Skilled Therapeutic Interventions/Progress Updates:  Upon entering the room, NT present setting pt up for supine to sit. Pt with no c/o pain this session. OT session with focus on pt education, STS, dynamic standing balance, self care, functional transfers, and self care. Pt required steady assist for toilet and tub transfers this session with use of quad cane. Ambulation with quad cane can be close supervision - steady assist. Pt often requiring verbal cues to slow down pace in order to increase safety. Pt able to bathe all 10/10 parts of body with use of bath mitt on R hand. Pt performed dressing while seated on bed and grooming while standing at sink side. Pt returned to bed with call bell and all needed items within reach before exiting.   Therapy Documentation Precautions:  Precautions Precautions: Fall Precaution Comments: R hemiparesis, impulsive, R lateral lean Restrictions Weight Bearing Restrictions: No Pain: Pain Assessment Pain Assessment: No/denies pain  See FIM for current functional status  Therapy/Group: Individual Therapy  Phineas Semen 10/27/2014, 8:19 PM

## 2014-10-27 NOTE — Progress Notes (Signed)
Speech Language Pathology Weekly Progress Note  Patient Details  Name: Teresa Martinez MRN: [id number] Date of Birth: 1941/03/20  Beginning of progress report period: October 20, 2014 End of progress report period: October 27, 2014   Short Term Goals: Week 1: SLP Short Term Goal 1 (Week 1): Patient will produce speech at the sentence level during structured tasks with Min verbal cues to utilize intelligibility strategies SLP Short Term Goal 1 - Progress (Week 1): Met SLP Short Term Goal 2 (Week 1): Patient will verbalize safety precautions with Supervision level question cues SLP Short Term Goal 2 - Progress (Week 1): Met SLP Short Term Goal 3 (Week 1): Patient will utilize retrieval strategies with new information and during moments of reported anomia with Min verbal cues SLP Short Term Goal 3 - Progress (Week 1): Met SLP Short Term Goal 4 (Week 1): Patient will utilize retrieval strategies with new information and during moments of reported anomia with Mod I  SLP Short Term Goal 4 - Progress (Week 1): Met    New Short Term Goals: Week 2: SLP Short Term Goal 1 (Week 2): Patient will utilize safety precautions with Mod I  SLP Short Term Goal 2 (Week 2): Patient will identify speech errors at the conversational with Mod I  SLP Short Term Goal 3 (Week 2): Patient will produce speech at the sentence level with appropriate intonation with Supervision level vebral cues.    Weekly Progress Updates: Patient has made functional gains and has met 4 out of 4 short term goals this reporting period due to improved safety awareness and speech intelligibility.  Currently, patient continues to require overall Supervision assist with these areas.  Patient and family education is also ongoing. Patient would benefit from continued skilled SLP intervention to maximize overall safety and speech intelligibility in order to maximize her functional independence prior to discharge.    Intensity:  Minumum of 1-2 x/day, 30 to 90 minutes Frequency: 5 out of 7 days Duration/Length of Stay: 12/15 Treatment/Interventions: Cueing hierarchy;Environmental controls;Functional tasks;Internal/external aids;Patient/family education;Speech/Language facilitation;Cognitive remediation/compensation  Carmelia Roller., CCC-SLP [phone]    Wiley Ford 10/27/2014, 5:02 PM

## 2014-10-27 NOTE — Progress Notes (Signed)
Speech Language Pathology Daily Session Note  Patient Details  Name: Teresa Martinez MRN: [id number] Date of Birth: 09/13/1941  Today's Date: 10/27/2014 SLP Individual Time: 1505-1605 SLP Individual Time Calculation (min): 60 min  Short Term Goals: Week 1: SLP Short Term Goal 1 (Week 1): Patient will produce speech at the sentence level during structured tasks with Min verbal cues to utilize intelligibility strategies SLP Short Term Goal 2 (Week 1): Patient will verbalize safety precautions with Supervision level question cues SLP Short Term Goal 3 (Week 1): Patient will utilize retrieval strategies with new information and during moments of reported anomia with Min verbal cues SLP Short Term Goal 3 - Progress (Week 1): Met SLP Short Term Goal 4 (Week 1): Patient will utilize retrieval strategies with new information and during moments of reported anomia with Mod I  SLP Short Term Goal 4 - Progress (Week 1): Met  Skilled Therapeutic Interventions: Skilled treatment session focused on addressing cognitive and speech intelligibility goals. SLP facilitated session by providing Supervision level verbal cues to self-monitor speech intelligibility. Once aware, patient then Mod I for corrections of imprecise consonant productions which typically occurred with consonant clusters.  SLP also facilitated session with scavenger hunt; patient was able to verbalize safety precautions Mos I but required Supervision level question cues to utilize safety precautions and request help as needed.  Continue with current plan of care.   FIM:  Comprehension Comprehension Mode: Auditory Comprehension: 6-Follows complex conversation/direction: With extra time/assistive device Expression Expression Mode: Verbal Expression: 5-Expresses complex 90% of the time/cues < 10% of the time Social Interaction Social Interaction: 5-Interacts appropriately 90% of the time - Needs monitoring or encouragement for  participation or interaction. Problem Solving Problem Solving: 5-Solves basic 90% of the time/requires cueing < 10% of the time Memory Memory: 6-More than reasonable amt of time FIM - Eating Eating Activity: 5: Set-up assist for open containers  Pain Pain Assessment Pain Assessment: No/denies pain  Therapy/Group: Individual Therapy  Melissa Bowie, M.A., CCC-SLP [phone]  BOWIE,MELISSA 10/27/2014, 4:53 PM   

## 2014-10-27 NOTE — Plan of Care (Signed)
Problem: RH BLADDER ELIMINATION Goal: RH STG MANAGE BLADDER WITH ASSISTANCE STG Manage Bladder With Assistance. Mod I  Outcome: Progressing  Problem: RH PAIN MANAGEMENT Goal: RH STG PAIN MANAGED AT OR BELOW PT'S PAIN GOAL < 3 on a 0-10 pain scale.  Outcome: Progressing  Problem: RH SKIN INTEGRITY Goal: RH STG SKIN FREE OF INFECTION/BREAKDOWN Remain free from infection/breakdown while on rehab with assist. Mod I  Outcome: Progressing  Problem: RH SAFETY Goal: RH STG DECREASED RISK OF FALL WITH ASSISTANCE STG Decreased Risk of Fall With Assistance. Mod I  Outcome: Progressing

## 2014-10-27 NOTE — Progress Notes (Signed)
Occupational Therapy Session Note  Patient Details  Name: Teresa OrnFrances H Mintz-Whitcomb MRN: [id number] Date of Birth: 05/01/1941  Today's Date: 10/27/2014 OT Individual Time: 1330-1430 OT Individual Time Calculation (min): 60 min    Short Term Goals: Week 1:  OT Short Term Goal 1 (Week 1): Pt will perform LB dressing with Mod A in order to decrease A with self care. OT Short Term Goal 2 (Week 1): Pt will perform bathing with Min A and use of bath mitt on R UE in order to increase independence and incorporate R UE into functional task. OT Short Term Goal 3 (Week 1): Pt will perform shower transfer with Min A stand pivot transfer onto TTB. OT Short Term Goal 4 (Week 1): Pt will perform UB dressing with Min A in order to increase I in self care.   Skilled Therapeutic Interventions/Progress Updates:    1:1 Pt in w/c when arrived. Focus on functional ambulation with quad cane in and out of bathroom with min A with VC for safety and slow her pace. Steady A for toileting in standing. Pt wanted to rely on grab bar for assistance for sit to stand but with extra time able to perform with steady A to close supervision.  Stood at sink to wash hands with min A for use of right UE with tactile cues for normal patterns of movement (to avoid hiking shoulder). Pt self propelled w/c with bilateral feet (with AFO on) to green line in front of gym and then ambulated with QBC to ADL apartment with min A.  Pt with multiple LOB and about to correct self (wth min A to maintain balance) with extra time 75% of time and was able to recognize her LOB was due to her fast pace. Pt required mod A to recover the other 25% (especially with turns). Pt participated in making cookie dough and cutting out shapes in the dough with focus on functional use of right hand in sitting and standing position.  Pt able maintain standing balance at counter with close superversion during tasks  Focus on normal patterns of movement, grasp and release,  awareness of placement of hand and use as a gross assist to hold objects. Returned to room and into bed with bed alarm set.    Therapy Documentation Precautions:  Precautions Precautions: Fall Precaution Comments: R hemiparesis, impulsive, R lateral lean Restrictions Weight Bearing Restrictions: No Pain:  no c/o pain in session   See FIM for current functional status  Therapy/Group: Individual Therapy  Roney MansSmith, Teagyn Fishel [facility] [place] 10/27/2014, 3:24 PM

## 2014-10-27 NOTE — Plan of Care (Signed)
Problem: RH PAIN MANAGEMENT Goal: RH STG PAIN MANAGED AT OR BELOW PT'S PAIN GOAL < 3 on a 0-10 pain scale  Outcome: Progressing No c/o pain     

## 2014-10-27 NOTE — Plan of Care (Signed)
Problem: RH BLADDER ELIMINATION Goal: RH STG MANAGE BLADDER WITH ASSISTANCE STG Manage Bladder With Assistance. Mod I  Outcome: Progressing No incontinent episode. Ambulates with quad cane to empty bladder.

## 2014-10-28 ENCOUNTER — Inpatient Hospital Stay (HOSPITAL_COMMUNITY): Payer: Medicare PPO | Admitting: Occupational Therapy

## 2014-10-28 ENCOUNTER — Inpatient Hospital Stay (HOSPITAL_COMMUNITY): Payer: Medicare PPO | Admitting: Rehabilitation

## 2014-10-28 ENCOUNTER — Inpatient Hospital Stay (HOSPITAL_COMMUNITY): Payer: Medicare PPO | Admitting: Speech Pathology

## 2014-10-28 NOTE — Plan of Care (Signed)
Problem: RH SAFETY Goal: RH STG DECREASED RISK OF FALL WITH ASSISTANCE STG Decreased Risk of Fall With Assistance. Supervision  Outcome: Progressing

## 2014-10-28 NOTE — Plan of Care (Signed)
Problem: RH SAFETY Goal: RH STG DECREASED RISK OF FALL WITH ASSISTANCE STG Decreased Risk of Fall With Assistance. Supervision  Outcome: Progressing Gait unsteady

## 2014-10-28 NOTE — Progress Notes (Signed)
Physical Therapy Weekly Progress Note  Patient Details  Name: Teresa Martinez MRN: [id number] Date of Birth: 11/24/1940  Beginning of progress report period: October 20, 2014 End of progress report period: October 28, 2014  Today's Date: 10/28/2014 PT Individual Time: 1300-1400 PT Individual Time Calculation (min): 60 min   Patient has met 4 of 4 short term goals.  Pt making very good progress towards all mobility goals.  Continue to feel she is most limited by her decreased safety awareness and impulsivity with all mobility and continue to recommend 24/7 S (close S) at time of D/C.    Patient continues to demonstrate the following deficits: decreased balance, decreased awareness, decreased safety, impulsivity, decreased strength in RUE/LE and therefore will continue to benefit from skilled PT intervention to enhance overall performance with activity tolerance, balance, postural control, ability to compensate for deficits, functional use of  right upper extremity and right lower extremity, attention, awareness and knowledge of precautions.  Patient progressing toward long term goals..  Continue plan of care.  PT Short Term Goals Week 1:  PT Short Term Goal 1 (Week 1): Pt will perform dynamic standing balance with min A  PT Short Term Goal 1 - Progress (Week 1): Met PT Short Term Goal 2 (Week 1): Pt will perform stand pivot transfers with min A level  PT Short Term Goal 2 - Progress (Week 1): Met PT Short Term Goal 3 (Week 1): Pt will self propel w/c using L hemi technique x 150' at mod I level  PT Short Term Goal 3 - Progress (Week 1): Met PT Short Term Goal 4 (Week 1): Pt will ambulate x 50' w/ LRAD at min A level  PT Short Term Goal 4 - Progress (Week 1): Met Week 2:  PT Short Term Goal 1 (Week 2): =LTG's due ELOS  Skilled Therapeutic Interventions/Progress Updates:   Pt received sitting in w/c in room, Chris from [facility] in room to assess need for R AFO.  Kept reaction AFO  donned during gait to/from restroom and to/from therapy gym x 150' x 2 reps without cane in order to further challenge balance and weight shift/WB through RLE.  Chris in agreement for reaction AFO and is to bring in later today or tomorrow.  Continue to provide cues for upright posture, safe gait speed (decreased for this pt), and increased step length on LLE.  Once in therapy gym, session focused on NMR for RLE/UE with standing toe taps to cones for slow controlled contraction in RLE with WB and also with hip/knee flex to place RLE to cone.  Performed x 15 reps each LE.  Then performed quadruped activities while reaching with LUE to grasp clothes pins and place to metal pole for slower controlled movement on RUE.  Therapist assisting with forward weight shift over RUE as well as maintaining support at R axilla and R elbow in extension.  Tolerated well, therefore transitioned to seated kinetron x 2 mins at mod/max resistance and standing x 2 reps of 1 min with focus on upright posture with increased R glute activation.  Then performed tall kneeling>half kneeling with LLE for increased WB through RLE with B UE support on therapist facing pt x 10 reps with break in between.  Ended session on seated nustep x 5 mins at level 5 to 4 resistance with BLEs only for increased NMR through RLE.  Pt ambulated back to room and left in w/c with all needs in reach.    Therapy Documentation  Precautions:  Precautions Precautions: Fall Precaution Comments: R hemiparesis, impulsive, R lateral lean Restrictions Weight Bearing Restrictions: No   Vital Signs: Therapy Vitals Temp: 98.7 F (37.1 C) Temp Source: Oral Pulse Rate: 65 Resp: 17 BP: 127/65 mmHg Patient Position (if appropriate): Lying Oxygen Therapy SpO2: 97 % O2 Device: Not Delivered Pain: No reports of pain during session.   See FIM for current functional status  Therapy/Group: Individual Therapy  Denice Bors 10/28/2014, 8:11 AM

## 2014-10-28 NOTE — Plan of Care (Signed)
Problem: RH SKIN INTEGRITY Goal: RH STG SKIN FREE OF INFECTION/BREAKDOWN Remain free from infection/breakdown while on rehab with assist. Mod I  Outcome: Progressing

## 2014-10-28 NOTE — Plan of Care (Signed)
Problem: RH PAIN MANAGEMENT Goal: RH STG PAIN MANAGED AT OR BELOW PT'S PAIN GOAL < 3 on a 0-10 pain scale  Outcome: Progressing No c/o pain     

## 2014-10-28 NOTE — Progress Notes (Signed)
Subjective/Complaints: Slept well Made cookies in OT yesterday Review of Systems - Negative except Right side weak  Objective: Vital Signs: Blood pressure 127/65, pulse 65, temperature 98.7 F (37.1 C), temperature source Oral, resp. rate 17, height _0  (1.676 m), weight 63 kg (138 lb 14.2 oz), SpO2 97 %. No results found. Results for orders placed or performed during the hospital encounter of 10/19/14 (from the past 72 hour(s))  Creatinine, serum     Status: Abnormal   Collection Time: 10/26/14  4:53 AM  Result Value Ref Range   Creatinine, Ser 0.77 0.50 - 1.10 mg/dL   GFR calc non Af Amer 80 (L) >90 mL/min   GFR calc Af Amer >90 >90 mL/min    Comment: (NOTE) The eGFR has been calculated using the CKD EPI equation. This calculation has not been validated in all clinical situations. eGFR's persistently <90 mL/min signify possible Chronic Kidney Disease.       Mood/affect bright Neuro: Alert/Oriented,romberg + cannot stand with feet together    hand/wrist, 4- delt, bi,tri and Dysarthric, 4/5 RLE, except 3- in foot/ankle Musc/Skel: no pain with AROM right shoulder Lungs clear Heart RRR no murmur abd soft non tender Ext No C/C/E  Gen NAD   Assessment/Plan: 1. Functional deficits secondary to Left pontine infarct causing R hemiparesis which require 3+ hours per day of interdisciplinary therapy in a comprehensive inpatient rehab setting. Physiatrist is providing close team supervision and 24 hour management of active medical problems listed below. Physiatrist and rehab team continue to assess barriers to discharge/monitor patient progress toward functional and medical goals.  FIM: FIM - Bathing Bathing Steps Patient Completed: Chest, Right Arm, Left Arm, Abdomen, Front perineal area, Buttocks, Right upper leg, Left upper leg, Right lower leg (including foot), Left lower leg (including foot) Bathing: 5: Supervision: Safety issues/verbal cues  FIM - Upper Body  Dressing/Undressing Upper body dressing/undressing steps patient completed: Thread/unthread right sleeve of pullover shirt/dresss, Thread/unthread left sleeve of pullover shirt/dress, Put head through opening of pull over shirt/dress, Pull shirt over trunk Upper body dressing/undressing: 5: Supervision: Safety issues/verbal cues FIM - Lower Body Dressing/Undressing Lower body dressing/undressing steps patient completed: Thread/unthread right underwear leg, Thread/unthread left underwear leg, Pull underwear up/down, Thread/unthread right pants leg, Thread/unthread left pants leg, Pull pants up/down, Don/Doff right sock, Don/Doff left sock, Don/Doff right shoe, Don/Doff left shoe Lower body dressing/undressing: 5: Supervision: Safety issues/verbal cues  FIM - Toileting Toileting steps completed by patient: Adjust clothing prior to toileting, Performs perineal hygiene, Adjust clothing after toileting Toileting Assistive Devices: Grab bar or rail for support Toileting: 5: Supervision: Safety issues/verbal cues  FIM - Radio producer Devices: Oncologist Transfers: 4-To toilet/BSC: Min A (steadying Pt. > 75%), 4-From toilet/BSC: Min A (steadying Pt. > 75%)  FIM - Bed/Chair Transfer Bed/Chair Transfer Assistive Devices: Arm rests Bed/Chair Transfer: 5: Supine > Sit: Supervision (verbal cues/safety issues), 5: Sit > Supine: Supervision (verbal cues/safety issues)  FIM - Locomotion: Wheelchair Distance: 150 Locomotion: Wheelchair: 0: Activity did not occur FIM - Locomotion: Ambulation Locomotion: Ambulation Assistive Devices: Other (comment) (no AD) Ambulation/Gait Assistance: 4: Min assist, 3: Mod assist Locomotion: Ambulation: 3: Travels 150 ft or more with moderate assistance (Pt: 50 - 74%)  Comprehension Comprehension Mode: Auditory Comprehension: 6-Follows complex conversation/direction: With extra time/assistive device  Expression Expression Mode:  Verbal Expression: 5-Expresses complex 90% of the time/cues < 10% of the time  Social Interaction Social Interaction: 5-Interacts appropriately 90% of the time - Needs monitoring  or encouragement for participation or interaction.  Problem Solving Problem Solving: 5-Solves complex 90% of the time/cues < 10% of the time  Memory Memory: 6-More than reasonable amt of time  Medical Problem List and Plan: 1. Functional deficits secondary to Left pontine infarct.   2.  DVT Prophylaxis/Anticoagulation: Pharmaceutical: Lovenox 3. Pain Management: Oxycodone prn for right shoulder pain. Ice as well as elevation for symptom management.   4. Mood: Reports of anxiety with adjustment reaction. Team to provide ego support. LCSW to follow for evaluation and support. Will consult chaplain services for follow up visit.   5. Neuropsych: This patient is capable of making decisions on her own behalf. 6. Skin/Wound Care: Routine pressure relief measures.   7. Fluids/Electrolytes/Nutrition: Monitor I/O. Offer nutritonal supplements as needed if intake poor. Husband encouraged to bring food from home.   8. HTN: Monitor every 8 hours. Off prinizide--avoid hypotension to allow adequate perfusion.   9. Right shoulder pain: likely due to RTC pathology: Negative X rays , voltaren gel, KPad S/p RIght subacromial injection on 12/7 102m celestone with 3cc 2% lidocaine Pt with reduced pain since then -exercises with PT  10. Right Thyroid nodules/Parotid nodule: Follow up with ENT on outpatient basis 11. Hyperlipidemia: unable to tolerate statins. diet   LOS (Days) 9 A FACE TO FACE EVALUATION WAS PERFORMED  KIRSTEINS,ANDREW E 10/28/2014, 8:01 AM

## 2014-10-28 NOTE — Plan of Care (Signed)
Problem: RH PAIN MANAGEMENT Goal: RH STG PAIN MANAGED AT OR BELOW PT'S PAIN GOAL < 3 on a 0-10 pain scale.  Outcome: Progressing

## 2014-10-28 NOTE — Progress Notes (Signed)
Social Work Patient ID: Teresa Martinez, female   DOB: 1941/07/17, 73 y.o.   MRN: [id number] Met with pt and spoke with husband to discuss pt's equipment needs of a [facility], husband agreeable to have worker order for pt.  Trying to arrange for family education. Contacted husband to inform of treatment tomorrow-8;45 & 10;00 tomorrow, he plans to be here to attend and learn her care for discharge.  He will have person who is staying with Her come in Monday.  Continue to work on discharge plans.

## 2014-10-28 NOTE — Progress Notes (Signed)
Speech Language Pathology Daily Session Note  Patient Details  Name: Linus OrnFrances H Mintz-Whitcomb MRN: [id number] Date of Birth: 07/16/1941  Today's Date: 10/28/2014 SLP Individual Time: [id number] SLP Individual Time Calculation (min): 60 min  Short Term Goals: Week 2: SLP Short Term Goal 1 (Week 2): Patient will utilize safety precautions with Mod I  SLP Short Term Goal 2 (Week 2): Patient will identify speech errors at the conversational with Mod I  SLP Short Term Goal 3 (Week 2): Patient will produce speech at the sentence level with appropriate intonation with Supervision level vebral cues.    Skilled Therapeutic Interventions:   Pt was seen for skilled ST targeting intelligibility goals.  Upon arrival, pt was reclined in bed with request to use the restroom.  Pt utilized her recommended safety precautions with supervision cues (i.e. lock brakes on wheelchair, use grab bar in bathroom, etc.)  SLP facilitated the session with structured practice for use of dysarthria strategies during a lengthy reading passage.  Pt was >80% intelligible in context during the abovementioned task with overall mod I use of dysarthria strategies.  Furthermore, pt identified speech errors with a highlighter to improve awareness and identify patterns of errors with mod I.  Pt is making good progress towards meeting goals.  Continue per current plan of care.    FIM:  Comprehension Comprehension Mode: Auditory Comprehension: 6-Follows complex conversation/direction: With extra time/assistive device Expression Expression Mode: Verbal Expression: 5-Expresses complex 90% of the time/cues < 10% of the time Social Interaction Social Interaction: 5-Interacts appropriately 90% of the time - Needs monitoring or encouragement for participation or interaction. Problem Solving Problem Solving: 5-Solves complex 90% of the time/cues < 10% of the time Memory Memory: 6-More than reasonable amt of time  Pain Pain  Assessment Pain Assessment: No/denies pain  Therapy/Group: Individual Therapy   Jackalyn LombardNicole Lashon Hillier, M.A. CCC-SLP  Lakeia Bradshaw, Melanee SpryNicole L 10/28/2014, 4:23 PM

## 2014-10-28 NOTE — Plan of Care (Signed)
Problem: RH BLADDER ELIMINATION Goal: RH STG MANAGE BLADDER WITH ASSISTANCE STG Manage Bladder With Assistance. Mod I  Outcome: Progressing     

## 2014-10-29 ENCOUNTER — Inpatient Hospital Stay (HOSPITAL_COMMUNITY): Payer: Medicare PPO | Admitting: Physical Therapy

## 2014-10-29 ENCOUNTER — Inpatient Hospital Stay (HOSPITAL_COMMUNITY): Payer: Medicare PPO | Admitting: Occupational Therapy

## 2014-10-29 ENCOUNTER — Encounter (HOSPITAL_COMMUNITY): Payer: Medicare PPO | Admitting: *Deleted

## 2014-10-29 NOTE — Progress Notes (Signed)
Subjective/Complaints: Shoulder pain with movement but not at rest Prior hx of shoulder pain Review of Systems - Negative except Right side weak  Objective: Vital Signs: Blood pressure 117/70, pulse 56, temperature 98 F (36.7 C), temperature source Oral, resp. rate 16, height 5\' 6"  (1.676 m), weight 63 kg (138 lb 14.2 oz), SpO2 99 %. No results found. No results found for this or any previous visit (from the past 72 hour(s)).    Mood/affect bright Neuro: Alert/Oriented,romberg + cannot stand with feet together    hand/wrist, 4- delt, bi,tri and Dysarthric, 4/5 RLE, except 3- in foot/ankle Musc/Skel: no pain with AROM right shoulder Lungs clear Heart RRR no murmur abd soft non tender Ext No C/C/E  Gen NAD   Assessment/Plan: 1. Functional deficits secondary to Left pontine infarct causing R hemiparesis which require 3+ hours per day of interdisciplinary therapy in a comprehensive inpatient rehab setting. Physiatrist is providing close team supervision and 24 hour management of active medical problems listed below. Physiatrist and rehab team continue to assess barriers to discharge/monitor patient progress toward functional and medical goals.  FIM: FIM - Bathing Bathing Steps Patient Completed: Chest, Right Arm, Left Arm, Abdomen, Front perineal area, Buttocks, Right upper leg, Left upper leg, Right lower leg (including foot), Left lower leg (including foot) Bathing: 5: Supervision: Safety issues/verbal cues  FIM - Upper Body Dressing/Undressing Upper body dressing/undressing steps patient completed: Thread/unthread right sleeve of pullover shirt/dresss, Thread/unthread left sleeve of pullover shirt/dress, Put head through opening of pull over shirt/dress, Pull shirt over trunk Upper body dressing/undressing: 5: Supervision: Safety issues/verbal cues FIM - Lower Body Dressing/Undressing Lower body dressing/undressing steps patient completed: Thread/unthread right underwear  leg, Thread/unthread left underwear leg, Pull underwear up/down, Thread/unthread right pants leg, Thread/unthread left pants leg, Pull pants up/down, Don/Doff right sock, Don/Doff left sock, Don/Doff right shoe, Don/Doff left shoe Lower body dressing/undressing: 5: Supervision: Safety issues/verbal cues  FIM - Toileting Toileting steps completed by patient: Adjust clothing prior to toileting, Performs perineal hygiene, Adjust clothing after toileting Toileting Assistive Devices: Grab bar or rail for support Toileting: 5: Supervision: Safety issues/verbal cues  FIM - Diplomatic Services operational officerToilet Transfers Toilet Transfers Assistive Devices: Occupational hygienistCane Toilet Transfers: 4-To toilet/BSC: Min A (steadying Pt. > 75%), 4-From toilet/BSC: Min A (steadying Pt. > 75%)  FIM - Bed/Chair Transfer Bed/Chair Transfer Assistive Devices: Arm rests Bed/Chair Transfer: 5: Supine > Sit: Supervision (verbal cues/safety issues), 5: Sit > Supine: Supervision (verbal cues/safety issues)  FIM - Locomotion: Wheelchair Distance: 150 Locomotion: Wheelchair: 0: Activity did not occur FIM - Locomotion: Ambulation Locomotion: Ambulation Assistive Devices: Other (comment) (no AD) Ambulation/Gait Assistance: 4: Min assist Locomotion: Ambulation: 4: Travels 150 ft or more with minimal assistance (Pt.>75%)  Comprehension Comprehension Mode: Auditory Comprehension: 6-Follows complex conversation/direction: With extra time/assistive device  Expression Expression Mode: Verbal Expression: 6-Expresses complex ideas: With extra time/assistive device  Social Interaction Social Interaction: 5-Interacts appropriately 90% of the time - Needs monitoring or encouragement for participation or interaction.  Problem Solving Problem Solving: 5-Solves basic 90% of the time/requires cueing < 10% of the time  Memory Memory: 6-More than reasonable amt of time  Medical Problem List and Plan: 1. Functional deficits secondary to Left pontine infarct.   2.   DVT Prophylaxis/Anticoagulation: Pharmaceutical: Lovenox 3. Pain Management: Oxycodone prn for right shoulder pain. Ice as well as elevation for symptom management.   4. Mood: Reports of anxiety with adjustment reaction. Team to provide ego support. LCSW to follow for evaluation and support. Will consult  chaplain services for follow up visit.   5. Neuropsych: This patient is capable of making decisions on her own behalf. 6. Skin/Wound Care: Routine pressure relief measures.   7. Fluids/Electrolytes/Nutrition: Monitor I/O. Offer nutritonal supplements as needed if intake poor. Husband encouraged to bring food from home.   8. HTN: Monitor every 8 hours. Off prinizide--avoid hypotension to allow adequate perfusion.   9. Right shoulder pain: likely due to RTC pathology: Negative X rays , voltaren gel, KPad S/p RIght subacromial injection on 12/7 12mg  celestone with 3cc 2% lidocaine Pt with reduced rest pain  -exercises with PT/OT  10. Right Thyroid nodules/Parotid nodule: Follow up with ENT on outpatient basis 11. Hyperlipidemia: unable to tolerate statins. diet   LOS (Days) 10 A FACE TO FACE EVALUATION WAS PERFORMED  KIRSTEINS,ANDREW E 10/29/2014, 8:40 AM

## 2014-10-29 NOTE — Progress Notes (Signed)
Physical Therapy Session Note  Patient Details  Name: Teresa Martinez MRN: [id number] Date of Birth: 1941-09-08  Today's Date: 10/29/2014 PT Individual Time: 0930-1030 PT Individual Time Calculation (min): 60 min   Short Term Goals: Week 1:  PT Short Term Goal 1 (Week 1): Pt will perform dynamic standing balance with min A  PT Short Term Goal 1 - Progress (Week 1): Met PT Short Term Goal 2 (Week 1): Pt will perform stand pivot transfers with min A level  PT Short Term Goal 2 - Progress (Week 1): Met PT Short Term Goal 3 (Week 1): Pt will self propel w/c using L hemi technique x 150' at mod I level  PT Short Term Goal 3 - Progress (Week 1): Met PT Short Term Goal 4 (Week 1): Pt will ambulate x 50' w/ LRAD at min A level  PT Short Term Goal 4 - Progress (Week 1): Met Week 2:  PT Short Term Goal 1 (Week 2): =LTG's due ELOS  Skilled Therapeutic Interventions/Progress Updates:    Pt impulsive and with decreased safety awareness throughout session requiring frequent cueing and education. Family trained for gait, transfers, and stairs requiring further training, mostly limited by pt inattention and frustration. Pt probably would do best with [facility] at this time. Pt would continue to benefit from skilled PT services to increase functional mobility.   Therapy Documentation Precautions:  Precautions Precautions: Fall Precaution Comments: R hemiparesis, impulsive, R lateral lean Restrictions Weight Bearing Restrictions: No Pain: Pain Assessment Pain Assessment: No/denies pain Pain Score: 0-No pain Faces Pain Scale: No hurt PAINAD (Pain Assessment in Advanced Dementia) Breathing: normal Mobility:  Pt min A with cues for hand and LE placement, weight shift, safety, implusivity Locomotion : Ambulation Ambulation/Gait Assistance: 4: Min assist 150'x1, 75'x3 with cues for posture and step length, trialed with [place] for one trial. Other Treatments:  Pt performs transfers x15 in session.  Pt performs repeated gait trials as above. Family training initiated as above with daughter and husband with cues for technique in transfers, stairs, and ambulation. Pt and family educated on rehab plan, safety in mobility, and pt's deficits.  See FIM for current functional status  Therapy/Group: Individual Therapy  Monia Pouch 10/29/2014, 12:19 PM

## 2014-10-29 NOTE — Progress Notes (Signed)
Occupational Therapy Session Note  Patient Details  Name: Teresa Martinez MRN: [id number] Date of Birth: 02/20/1941  Today's Date: 10/29/2014 OT Individual Time: [id number] OT Individual Time Calculation (min): 60 min    Short Term Goals: Week 2:  OT Short Term Goal 1 (Week 2): progress towards LTGs as pt discharge scheduled for 12/15  Skilled Therapeutic Interventions/Progress Updates:  Upon entering room, pt supine in bed with daughter and spouse present in room. Pt with no c/o pain this session. Skilled OT session with focus on dynamic standing balance, self care, functional transfers, and pt/family education. Pt required close supervision for most tasks this session but required steady assist with ambulation with quad cane for shower and toilet transfer. Her daughter reports that she will be assisting pt with bathing at home and demonstrated ability to provide proper close supervision as well as steady assist when needed at the appropriate times. Pt engaged in self care tasks this session via shower in walk in shower and seated on TTB. Grooming and dressing performed seated on EOB. OT discussed with pt and caregivers safety risks within the home such as removal of throw rugs, placement of 1 nonslip bath mat outside of shower, and recommendation for grab bar and safety treads in shower. Patient spouse requesting HHOT once pt discharged from rehab. Pt seated in bed with bed alarm on and call bell within reach upon exiting the room.   Therapy Documentation Precautions:  Precautions Precautions: Fall Precaution Comments: R hemiparesis, impulsive, R lateral lean Restrictions Weight Bearing Restrictions: No   See FIM for current functional status  Therapy/Group: Individual Therapy  Lowella Gripittman, Kenzlie Disch L 10/29/2014, 1:55 PM

## 2014-10-30 ENCOUNTER — Inpatient Hospital Stay (HOSPITAL_COMMUNITY): Payer: Medicare PPO

## 2014-10-30 ENCOUNTER — Inpatient Hospital Stay (HOSPITAL_COMMUNITY): Payer: Medicare PPO | Admitting: Physical Therapy

## 2014-10-30 DIAGNOSIS — N319 Neuromuscular dysfunction of bladder, unspecified: Secondary | ICD-10-CM

## 2014-10-30 MED ORDER — OXYBUTYNIN CHLORIDE 5 MG PO TABS
2.5000 mg | ORAL_TABLET | Freq: Every day | ORAL | Status: DC
  Administered 2014-10-30 – 2014-10-31 (×2): 2.5 mg via ORAL
  Filled 2014-10-30 (×4): qty 0.5

## 2014-10-30 NOTE — Progress Notes (Signed)
Subjective/Complaints: No new issues overnite, slept very well, still with some urinary freq Review of Systems - Negative except Right side weak  Objective: Vital Signs: Blood pressure 124/73, pulse 61, temperature 98.6 F (37 C), temperature source Oral, resp. rate 18, height 5\' 6"  (1.676 m), weight 63 kg (138 lb 14.2 oz), SpO2 99 %. No results found. No results found for this or any previous visit (from the past 72 hour(s)).    Mood/affect bright Neuro: Alert/Oriented,romberg + cannot stand with feet together    hand/wrist, 4- delt, bi,tri and Dysarthric, 4/5 RLE, except 3- in foot/ankle Musc/Skel: no pain with AROM right shoulder Lungs clear Heart RRR no murmur abd soft non tender Ext No C/C/E  Gen NAD   Assessment/Plan: 1. Functional deficits secondary to Left pontine infarct causing R hemiparesis which require 3+ hours per day of interdisciplinary therapy in a comprehensive inpatient rehab setting. Physiatrist is providing close team supervision and 24 hour management of active medical problems listed below. Physiatrist and rehab team continue to assess barriers to discharge/monitor patient progress toward functional and medical goals.  FIM: FIM - Bathing Bathing Steps Patient Completed: Chest, Right Arm, Left Arm, Abdomen, Front perineal area, Buttocks, Right upper leg, Left upper leg, Right lower leg (including foot), Left lower leg (including foot) Bathing: 5: Supervision: Safety issues/verbal cues  FIM - Upper Body Dressing/Undressing Upper body dressing/undressing steps patient completed: Thread/unthread right sleeve of pullover shirt/dresss, Thread/unthread left sleeve of pullover shirt/dress, Put head through opening of pull over shirt/dress, Pull shirt over trunk Upper body dressing/undressing: 5: Supervision: Safety issues/verbal cues FIM - Lower Body Dressing/Undressing Lower body dressing/undressing steps patient completed: Thread/unthread right underwear leg,  Thread/unthread left underwear leg, Pull underwear up/down, Thread/unthread right pants leg, Thread/unthread left pants leg, Pull pants up/down, Don/Doff right sock, Don/Doff left sock, Don/Doff right shoe, Don/Doff left shoe Lower body dressing/undressing: 4: Steadying Assist  FIM - Toileting Toileting steps completed by patient: Adjust clothing prior to toileting, Performs perineal hygiene, Adjust clothing after toileting Toileting Assistive Devices: Grab bar or rail for support Toileting: 5: Supervision: Safety issues/verbal cues  FIM - Diplomatic Services operational officerToilet Transfers Toilet Transfers Assistive Devices: Occupational hygienistCane Toilet Transfers: 4-To toilet/BSC: Min A (steadying Pt. > 75%), 4-From toilet/BSC: Min A (steadying Pt. > 75%)  FIM - Bed/Chair Transfer Bed/Chair Transfer Assistive Devices: Arm rests Bed/Chair Transfer: 5: Supine > Sit: Supervision (verbal cues/safety issues), 5: Bed > Chair or W/C: Supervision (verbal cues/safety issues)  FIM - Locomotion: Wheelchair Distance: 150 Locomotion: Wheelchair: 5: Travels 150 ft or more: maneuvers on rugs and over door sills with supervision, cueing or coaxing FIM - Locomotion: Ambulation Locomotion: Ambulation Assistive Devices: Other (comment) (no AD) Ambulation/Gait Assistance: 4: Min assist Locomotion: Ambulation: 4: Travels 150 ft or more with minimal assistance (Pt.>75%)  Comprehension Comprehension Mode: Auditory Comprehension: 5-Follows basic conversation/direction: With no assist  Expression Expression Mode: Verbal Expression: 6-Expresses complex ideas: With extra time/assistive device  Social Interaction Social Interaction: 5-Interacts appropriately 90% of the time - Needs monitoring or encouragement for participation or interaction.  Problem Solving Problem Solving: 5-Solves basic 90% of the time/requires cueing < 10% of the time  Memory Memory: 6-More than reasonable amt of time  Medical Problem List and Plan: 1. Functional deficits  secondary to Left pontine infarct.   2.  DVT Prophylaxis/Anticoagulation: Pharmaceutical: Lovenox 3. Pain Management: Oxycodone prn for right shoulder pain. Ice as well as elevation for symptom management.   4. Mood: Reports of anxiety with adjustment reaction. Team to provide  ego support. LCSW to follow for evaluation and support. Will consult chaplain services for follow up visit.   5. Neuropsych: This patient is capable of making decisions on her own behalf. 6. Skin/Wound Care: Routine pressure relief measures.   7. Fluids/Electrolytes/Nutrition: Monitor I/O. Offer nutritonal supplements as needed if intake poor. Husband encouraged to bring food from home.   8. HTN: Monitor every 8 hours. Off prinizide--avoid hypotension to allow adequate perfusion.   9. Right shoulder pain: likely due to RTC pathology: Negative X rays , voltaren gel, KPad S/p RIght subacromial injection on 12/7 12mg  celestone with 3cc 2% lidocaine Pt with reduced rest pain  -exercises with PT/OT  10. Right Thyroid nodules/Parotid nodule: Follow up with ENT on outpatient basis 11. Hyperlipidemia: unable to tolerate statins. diet  12.  Spastic bladder-trial oxybutnin LOS (Days) 11 A FACE TO FACE EVALUATION WAS PERFORMED  KIRSTEINS,ANDREW E 10/30/2014, 9:13 AM

## 2014-10-30 NOTE — Progress Notes (Signed)
Occupational Therapy Session Note  Patient Details  Name: Teresa SchaumannFrances H Martinez MRN: [id number] Date of Birth: 12/09/1940  Today's Date: 10/30/2014 OT Individual Time: 0900-1000 OT Individual Time Calculation (min): 60 min    Short Term Goals: Week 2:  OT Short Term Goal 1 (Week 2): progress towards LTGs as pt discharge scheduled for 12/15  Skilled Therapeutic Interventions/Progress Updates: ADL-retraining with focus on improved safety awareness, dynamic standing balance, functional transfers, and functional mobility using quad cane.   Pt received supine in bed but receptive for planned assist with B & D.   Pt able rise to sit at edge of bed and ambulate to bathroom although with rapid pace and LOB 2 times.   Pt minimized LOB with statements re-affirming her self-confidence with her mobility despite present fall risk.    Pt performed bathing seated on bench, standing at grab bar to wash buttocks and peri-area and using wash mit to incorporate right hand in bathing task.    Pt able to reach all body parts although continues to exhibit right lateral lean while dressing seated.   After grooming at sink pt was challenged to ambulate to ADL apt to perform bed mobility w/o use of rails on standard bed and to demo ability to bend and reach items in lower cabinets of kitchen.   Pt demo'd LOB while ambulating 6 additional times and was educated on increased risk for falls when distracted by stimulating environment.   Pt was escorted back to her room in her w/c d/t fatigue and left with husband attending her needs while awaiting physical therapist.    Pt generally moves quickly and at times recklessly without appreciation of LOB as significant.    Husband appears aware of pt's deficits and is awaiting home safety assessment for re-ed on use of grab bars and any DME.     Therapy Documentation Precautions:  Precautions Precautions: Fall Precaution Comments: R hemiparesis, impulsive, R lateral  lean Restrictions Weight Bearing Restrictions: No  Pain: Pain Assessment Pain Score: 0-No pain Faces Pain Scale: No hurt PAINAD (Pain Assessment in Advanced Dementia) Breathing: normal  See FIM for current functional status  Therapy/Group: Individual Therapy  Stefan Karen 10/30/2014, 12:04 PM

## 2014-10-30 NOTE — Progress Notes (Signed)
Physical Therapy Session Note  Patient Details  Name: Teresa Martinez MRN: [id number] Date of Birth: 10-Nov-1941  Today's Date: 10/30/2014 PT Individual Time: 1000-1100 PT Individual Time Calculation (min): 60 min   Short Term Goals: Week 1:  PT Short Term Goal 1 (Week 1): Pt will perform dynamic standing balance with min A  PT Short Term Goal 1 - Progress (Week 1): Met PT Short Term Goal 2 (Week 1): Pt will perform stand pivot transfers with min A level  PT Short Term Goal 2 - Progress (Week 1): Met PT Short Term Goal 3 (Week 1): Pt will self propel w/c using L hemi technique x 150' at mod I level  PT Short Term Goal 3 - Progress (Week 1): Met PT Short Term Goal 4 (Week 1): Pt will ambulate x 50' w/ LRAD at min A level  PT Short Term Goal 4 - Progress (Week 1): Met Week 2:  PT Short Term Goal 1 (Week 2): =LTG's due ELOS Skilled Therapeutic Interventions/Progress Updates:    Pt demonstrates improvement from previous session requiring decreased cues for hand placement and increased occurences of proper hand placement. Pt still with instances where she does not carry over cues. Husband trained again for family training with husband now safe for transfers, gait, and stairs. Pt with weight shifting to L due to learned non use, addressed with with walker trial and pregait with UE support for forced R sided use. Pt would continue to benefit from skilled PT services to increase functional mobility.   Therapy Documentation Precautions:  Precautions Precautions: Fall Precaution Comments: R hemiparesis, impulsive Restrictions Weight Bearing Restrictions: No Pain: Pain Assessment Pain Assessment: No/denies pain Mobility:  Pt performs transfers with cues for hand/LE placement and weight shift Locomotion : Ambulation Ambulation/Gait Assistance: 4: Min assist with cues for sequencing, weight shift with SBQC and RW 150'x4. Stairs x10 in session with cues for sequencing, safety, and  technique. Wheelchair Mobility Distance: 150  Other Treatments:   Pt educated on rehab plan, safety in mobility. Husband trained and safe for transfers, gait, and stairs with patient. Pt performs dynamic gait with increased gait velocity and dual cog tasks. Pt performs transfers 2x5 in session. Pt performs RLE forced use with LLE stepping without UE support 3x10. Anterior weight shifts in standing, lateral weight shifts in standing, perturbation in standing, heel raises 2x10.  See FIM for current functional status  Therapy/Group: Individual Therapy  Monia Pouch 10/30/2014, 6:11 PM

## 2014-10-31 ENCOUNTER — Inpatient Hospital Stay (HOSPITAL_COMMUNITY): Payer: Medicare PPO | Admitting: Rehabilitation

## 2014-10-31 ENCOUNTER — Inpatient Hospital Stay (HOSPITAL_COMMUNITY): Payer: Medicare PPO | Admitting: Speech Pathology

## 2014-10-31 ENCOUNTER — Inpatient Hospital Stay (HOSPITAL_COMMUNITY): Payer: Medicare PPO | Admitting: Occupational Therapy

## 2014-10-31 DIAGNOSIS — N319 Neuromuscular dysfunction of bladder, unspecified: Secondary | ICD-10-CM

## 2014-10-31 DIAGNOSIS — N318 Other neuromuscular dysfunction of bladder: Secondary | ICD-10-CM

## 2014-10-31 NOTE — Plan of Care (Signed)
Problem: RH Simple Meal Prep Goal: LTG Patient will perform simple meal prep w/assist (OT) LTG: Patient will perform simple meal prep with assistance, with/without cues (OT).  Outcome: Adequate for Discharge Patient primarily min assist secondary to decreased safety awareness and decreased ability to slow down during IADL tasks.

## 2014-10-31 NOTE — Progress Notes (Signed)
Physical Therapy Discharge Summary  Patient Details  Name: Teresa Martinez MRN: [id number] Date of Birth: 01/25/1941  Today's Date: 10/31/2014 PT Individual Time: 1300-1400 PT Individual Time Calculation (min): 60 min    Patient has met 10 of 11 long term goals due to improved activity tolerance, improved balance, improved postural control, increased strength, increased range of motion, ability to compensate for deficits, functional use of  right upper extremity and right lower extremity, improved attention, improved awareness and improved coordination.  Patient to discharge at an ambulatory level Supervision.   Patient's care partner is independent to provide the necessary physical and cognitive assistance at discharge.  Reasons goals not met: Pt continues to require min cues for emergent awareness during mobility.  Note that PT recommending hands on assist for gait without AFO, esp at night from husband.    Recommendation:  Patient will benefit from ongoing skilled PT services in outpatient setting to continue to advance safe functional mobility, address ongoing impairments in decreased balance, decreased strength in RUE/LE, decreased safety, decreased awareness, and minimize fall risk.  Equipment: [facility]  Reasons for discharge: treatment goals met and discharge from hospital  Patient/family agrees with progress made and goals achieved: Yes   PT Treatment/Intervention: Pt received sitting in w/c in room, agreeable to therapy gym.  Performed w/c mobility x 150' using L hemi technique at mod I level.  Performed car transfer at S level with cues for safety and w/c placement.  Ambulated throughout session >150' in controlled and carpeted environment at S level with [facility].  Continued cues for attention to task, as she is easily distracted by external environment.  Also cues for upright posture and increased step length on LLE.  Then performed furniture transfer in ADL apt at S level, bed  mobility in ADL apt at mod I level.  Ambulated to therapy gym to perform TUG, see details below.  Note that time was 1 min and 22 seconds with mod A on day of eval and now is 32.73 secs with S and LBQC.  Performed seated nustep x 5 mins at level 5 resistance with LEs only for NMR through RLE.  Performed flight of stairs x 2 reps, first with min A with mod cues for safety and sequencing and second time at S level with continued cues for safety.  Pt ambulated back to room and was left in bed with all needs in reach.  Strength, sensation, etc tested, see details below.    PT Discharge Precautions/Restrictions Precautions Precautions: Fall Precaution Comments: R hemiparesis, impulsive Restrictions Weight Bearing Restrictions: No Vital Signs Therapy Vitals Temp: 98 F (36.7 C) Temp Source: Oral Pulse Rate: 61 Resp: 17 BP: (!) 121/57 mmHg Patient Position (if appropriate): Lying Oxygen Therapy SpO2: 93 % O2 Device: Not Delivered Pain: no report of pain during session, received voltaren gel prior to session.  Pain Assessment Pain Assessment: No/denies pain Vision/Perception  Perception Comments: Some of patient's right visual inattention may be secondary to her need to complete tasks quickly instead of spending time to attend to and use her RUE.See OT note Cognition Overall Cognitive Status: Within Functional Limits for tasks assessed Arousal/Alertness: Awake/alert Orientation Level: Oriented X4 Attention: Selective;Alternating Sustained Attention: Appears intact Selective Attention: Appears intact Alternating Attention: Impaired Alternating Attention Impairment: Functional basic Memory: Impaired Memory Impairment: Decreased recall of new information Awareness: Impaired Awareness Impairment: Anticipatory impairment Behaviors: Impulsive Safety/Judgment: Impaired Comments: Pt continues to demonstrate impulsivity with all aspects of mobility, therefore continuing to recommend 24/7 S  at time of D/C.  Sensation Sensation Light Touch: Appears Intact Stereognosis: Not tested Hot/Cold: Not tested Proprioception: Appears Intact Coordination Gross Motor Movements are Fluid and Coordinated: No Fine Motor Movements are Fluid and Coordinated: No Coordination and Movement Description: Pt continues to be limited with fine motor movements in RUE and decreased coordination in RLE, esp during stair negotiation and gait.  Motor  Motor Motor: Hemiplegia Motor - Discharge Observations: Pt with decreased strength in RLE, decreased functional use of RUE, decreased balance.   Mobility Bed Mobility Bed Mobility: Supine to Sit;Sit to Supine Supine to Sit: 6: Modified independent (Device/Increase time) Sit to Supine: 6: Modified independent (Device/Increase time) Transfers Transfers: Yes Sit to Stand: 5: Supervision Sit to Stand Details: Verbal cues for precautions/safety Stand to Sit: 5: Supervision Stand to Sit Details (indicate cue type and reason): Verbal cues for precautions/safety Stand Pivot Transfers: 5: Supervision Stand Pivot Transfer Details: Verbal cues for sequencing;Verbal cues for technique;Verbal cues for precautions/safety Locomotion  Ambulation Ambulation: Yes Ambulation/Gait Assistance: 5: Supervision Ambulation Distance (Feet): 150 Feet Assistive device: Large base quad cane Ambulation/Gait Assistance Details: Verbal cues for sequencing;Verbal cues for technique;Verbal cues for precautions/safety;Verbal cues for gait pattern;Verbal cues for safe use of DME/AE Gait Gait: Yes Gait Pattern: Impaired Gait Pattern: Step-to pattern;Step-through pattern;Decreased step length - left;Decreased stance time - right;Decreased stride length;Decreased dorsiflexion - right;Decreased weight shift to left;Trunk rotated posteriorly on right;Poor foot clearance - right;Narrow base of support;Trunk flexed Stairs / Additional Locomotion Stairs: Yes Stairs Assistance: 5:  Supervision Stairs Assistance Details: Verbal cues for sequencing;Verbal cues for technique;Verbal cues for precautions/safety;Verbal cues for gait pattern;Verbal cues for safe use of DME/AE Stair Management Technique: One rail Left;Step to pattern;Forwards Number of Stairs: 5 Height of Stairs: 6 Wheelchair Mobility Wheelchair Mobility: Yes Wheelchair Assistance: 6: Modified independent (Device/Increase time) Wheelchair Propulsion: Left upper extremity;Left lower extremity Wheelchair Parts Management: Supervision/cueing Distance: 150  Trunk/Postural Assessment  Cervical Assessment Cervical Assessment: Within Functional Limits Thoracic Assessment Thoracic Assessment: Within Functional Limits Lumbar Assessment Lumbar Assessment: Within Functional Limits Postural Control Postural Control: Deficits on evaluation Protective Responses: Continues to be delayed, but noting improved hip and stepping strategy Postural Limitations: Intermittent LOB to the R with min cues for increased R LE activation to prevent R lateral lean.   Balance Balance Balance Assessed: Yes Standardized Balance Assessment Standardized Balance Assessment: Timed Up and Go Test Timed Up and Go Test TUG: Normal TUG Normal TUG (seconds): 32.73 Dynamic Standing Balance Dynamic Standing - Balance Support: During functional activity Dynamic Standing - Level of Assistance: 5: Stand by assistance Dynamic Standing - Balance Activities: Lateral lean/weight shifting;Forward lean/weight shifting Extremity Assessment    LUE Assessment LUE Assessment: Within Functional Limits RLE Assessment RLE Assessment: Exceptions to [facility] RLE Strength RLE Overall Strength: Deficits Right Hip Flexion: 3+/5 Right Hip Extension: 3+/5 Right Knee Flexion: 3+/5 Right Knee Extension: 3+/5 Right Ankle Dorsiflexion: 3/5 Right Ankle Plantar Flexion: 3/5 LLE Assessment LLE Assessment: Within Functional Limits  See FIM for current functional  status  Denice Bors 10/31/2014, 3:50 PM

## 2014-10-31 NOTE — Progress Notes (Signed)
Physical Therapy Session Note  Patient Details  Name: Teresa Martinez MRN: [id number] Date of Birth: 15-Jan-1941  Today's Date: 10/31/2014 PT Individual Time: 1140-1203 PT Individual Time Calculation (min): 23 min   Short Term Goals: Week 1:  PT Short Term Goal 1 (Week 1): Pt will perform dynamic standing balance with min A  PT Short Term Goal 1 - Progress (Week 1): Met PT Short Term Goal 2 (Week 1): Pt will perform stand pivot transfers with min A level  PT Short Term Goal 2 - Progress (Week 1): Met PT Short Term Goal 3 (Week 1): Pt will self propel w/c using L hemi technique x 150' at mod I level  PT Short Term Goal 3 - Progress (Week 1): Met PT Short Term Goal 4 (Week 1): Pt will ambulate x 50' w/ LRAD at min A level  PT Short Term Goal 4 - Progress (Week 1): Met Week 2:  PT Short Term Goal 1 (Week 2): =LTG's due ELOS  Skilled Therapeutic Interventions/Progress Updates:   Pt received at the end of B&D session.  Continued functional transfer training for home.  Pt states at night she will ambulate to bathroom with quad cane and husband's supervision but without donning shoes and AFO vs. Using a w/c.  Set up transfer to simulate home environment.  Pt performed transfers w/c > bed stand pivot with quad cane supervision. Pt doffed shoes and AFO mod I and performed bed mobility on flat bed, no rails mod I.  Pt transferred bed > toilet ambulating with quad cane and gripper socks only (no shoes or AFO) and only required supervision with verbal cues for safety.  Pt also performed all toileting tasks and hand hygiene at sink with supervision and verbal cues/encouragement to utilize RUE during functional tasks.  Returned to w/c and donned shoes and AFO.  Pt performed dynamic standing balance and weight shift training with lateral stepping to L and R and retro stepping x 20' with min A but no UE support on RW with focus on full R lateral weight shift, activation of glute med for stabilization in  stance and increased stance time on RLE.  Without UE support pt does demonstrate increased compensations and impairments in postural and trunk control.  Will continue to address.  Therapy Documentation Precautions:  Precautions Precautions: Fall Precaution Comments: R hemiparesis, impulsive Restrictions Weight Bearing Restrictions: No Pain: Pain Assessment Pain Assessment: No/denies pain  See FIM for current functional status  Therapy/Group: Individual Therapy  Raylene Everts [facility] 10/31/2014, 12:06 PM

## 2014-10-31 NOTE — Progress Notes (Signed)
Subjective/Complaints: Slept well , no urinary freq last noc No nocturnal shoulder pain Review of Systems - Negative except Right side weak  Objective: Vital Signs: Blood pressure 121/57, pulse 61, temperature 98 F (36.7 C), temperature source Oral, resp. rate 17, height 5\' 6"  (1.676 m), weight 63 kg (138 lb 14.2 oz), SpO2 93 %. No results found. No results found for this or any previous visit (from the past 72 hour(s)).    Mood/affect bright Neuro: Alert/Oriented,romberg + cannot stand with feet together    hand/wrist, 4- delt, bi,tri and Dysarthric, 4/5 RLE, except 3- in foot/ankle Musc/Skel: no pain with AROM right shoulder Lungs clear Heart RRR no murmur abd soft non tender Ext No C/C/E  Gen NAD   Assessment/Plan: 1. Functional deficits secondary to Left pontine infarct causing R hemiparesis which require 3+ hours per day of interdisciplinary therapy in a comprehensive inpatient rehab setting. Physiatrist is providing close team supervision and 24 hour management of active medical problems listed below. Physiatrist and rehab team continue to assess barriers to discharge/monitor patient progress toward functional and medical goals. Plan D/C in am FIM: FIM - Bathing Bathing Steps Patient Completed: Chest, Right Arm, Left Arm, Abdomen, Front perineal area, Buttocks, Right upper leg, Left upper leg, Right lower leg (including foot), Left lower leg (including foot) Bathing: 5: Supervision: Safety issues/verbal cues  FIM - Upper Body Dressing/Undressing Upper body dressing/undressing steps patient completed: Thread/unthread right sleeve of pullover shirt/dresss, Thread/unthread left sleeve of pullover shirt/dress, Put head through opening of pull over shirt/dress, Pull shirt over trunk Upper body dressing/undressing: 5: Set-up assist to: Obtain clothing/put away FIM - Lower Body Dressing/Undressing Lower body dressing/undressing steps patient completed: Thread/unthread right  underwear leg, Thread/unthread left underwear leg, Pull underwear up/down, Thread/unthread right pants leg, Thread/unthread left pants leg, Pull pants up/down, Don/Doff right sock, Don/Doff left sock, Don/Doff right shoe, Don/Doff left shoe, Fasten/unfasten right shoe, Fasten/unfasten left shoe Lower body dressing/undressing: 5: Set-up assist to: Don/Doff AFO/prosthesis/orthosis  FIM - Toileting Toileting steps completed by patient: Adjust clothing prior to toileting, Performs perineal hygiene, Adjust clothing after toileting Toileting Assistive Devices: Grab bar or rail for support Toileting: 5: Supervision: Safety issues/verbal cues  FIM - Diplomatic Services operational officerToilet Transfers Toilet Transfers Assistive Devices: Occupational hygienistCane Toilet Transfers: 4-To toilet/BSC: Min A (steadying Pt. > 75%), 4-From toilet/BSC: Min A (steadying Pt. > 75%)  FIM - Bed/Chair Transfer Bed/Chair Transfer Assistive Devices: Arm rests Bed/Chair Transfer: 4: Bed > Chair or W/C: Min A (steadying Pt. > 75%), 4: Chair or W/C > Bed: Min A (steadying Pt. > 75%)  FIM - Locomotion: Wheelchair Distance: 150 Locomotion: Wheelchair: 5: Travels 150 ft or more: maneuvers on rugs and over door sills with supervision, cueing or coaxing FIM - Locomotion: Ambulation Locomotion: Ambulation Assistive Devices: Other (comment) (no AD) Ambulation/Gait Assistance: 5: Supervision Locomotion: Ambulation: 4: Travels 150 ft or more with minimal assistance (Pt.>75%)  Comprehension Comprehension Mode: Auditory Comprehension: 5-Follows basic conversation/direction: With no assist  Expression Expression Mode: Verbal Expression: 6-Expresses complex ideas: With extra time/assistive device  Social Interaction Social Interaction: 5-Interacts appropriately 90% of the time - Needs monitoring or encouragement for participation or interaction.  Problem Solving Problem Solving: 5-Solves basic 90% of the time/requires cueing < 10% of the time  Memory Memory: 6-More than  reasonable amt of time  Medical Problem List and Plan: 1. Functional deficits secondary to Left pontine infarct.   2.  DVT Prophylaxis/Anticoagulation: Pharmaceutical: Lovenox 3. Pain Management: Oxycodone prn for right shoulder pain. Ice as  well as elevation for symptom management.   4. Mood: Reports of anxiety with adjustment reaction. Team to provide ego support. LCSW to follow for evaluation and support. Will consult chaplain services for follow up visit.   5. Neuropsych: This patient is capable of making decisions on her own behalf. 6. Skin/Wound Care: Routine pressure relief measures.   7. Fluids/Electrolytes/Nutrition: Monitor I/O. Offer nutritonal supplements as needed if intake poor. Husband encouraged to bring food from home.   8. HTN: Monitor every 8 hours. Off prinizide--avoid hypotension to allow adequate perfusion.   9. Right shoulder pain: likely due to RTC pathology: Negative X rays , voltaren gel, KPad S/p RIght subacromial injection on 12/7 12mg  celestone with 3cc 2% lidocaine Pt with reduced rest pain  -exercises with PT/OT  10. Right Thyroid nodules/Parotid nodule: Follow up with ENT on outpatient basis 11. Hyperlipidemia: unable to tolerate statins. diet  12.  Spastic bladder-trial oxybutnin- helpful thus far.  WIll provide Rx at D/C in am LOS (Days) 12 A FACE TO FACE EVALUATION WAS PERFORMED  KIRSTEINS,ANDREW E 10/31/2014, 8:31 AM

## 2014-10-31 NOTE — Progress Notes (Signed)
Speech Language Pathology Discharge Summary and Final Treatment Note  Patient Details  Name: Teresa Martinez MRN: [id number] Date of Birth: 12-03-1940  Today's Date: 10/31/2014 SLP Individual Time: 1400-1500 SLP Individual Time Calculation (min): 60 min   Skilled Therapeutic Interventions: Skilled treatment session focused on addressing speech intelligibility goals and wrap up education. SLP facilitated session with extra time to allow patient to self-monitor speech intelligibility, which patient was Mod I for throughout session.  Patient verbally reports that she can usually hear her intelligibility errors and states that her husband has been pointing them out to her.  Once she is aware she is always able to correct the error and feels that she can continue to address this without skilled SLP services at this time.  She utilized her external aids to recall useful strategies for discharge.  Recommend no further skilled SLP services at this time; patient in agreement.    Patient has met 3 of 3 long term goals.  Patient to discharge at overall Modified Independent;Supervision level.  Reasons goals not met: n/a   Clinical Impression/Discharge Summary: Patient has made functional gains and has met 3 out of 3 long term goals during her [facility] stay due to improved safety awareness, ability to utilize word finding strategies as needed and overall speech intelligibility.  Currently, patient continues to require overall Supervision assist with safety awareness during mobility, which results in her requiring 24/7 assist.  Patient is also requiring Supervision cues-Mod I for awareness of speech intelligibility errors; however, once aware she is Mod I for correcting errors and can utilize word finding strategies with increased time as well. Patient education has been completed and as a result, no further skilled SLP services are warranted at this time.      Care Partner:   Caregiver Able to Provide Assistance: Yes (per report )  Type of Caregiver Assistance: Physical  Recommendation:  24 hour supervision/assistance (no skilled SLP services )  Rationale for SLP Follow Up:  (n/a)   Equipment: none   Reasons for discharge: Treatment goals met;Discharged from hospital   Patient/Family Agrees with Progress Made and Goals Achieved: Yes   See FIM for current functional status  Carmelia Roller., CCC-SLP [place] 10/31/2014, 4:41 PM

## 2014-10-31 NOTE — Plan of Care (Signed)
Problem: RH Awareness Goal: LTG: Patient will demonstrate intellectual/emergent (PT) LTG: Patient will demonstrate intellectual/emergent/anticipatory awareness with assist during a mobility activity (PT)  Outcome: Not Met (add Reason) Pt requires min cuing for emergent awareness during mobility.

## 2014-10-31 NOTE — Progress Notes (Signed)
Social Work Patient ID: [name], female   DOB: 08/04/1941, 73 y.o.   MRN: [id number] Spoke with Cheryl-pt's daughter to discuss concerns and that pt needs 24 hr close supervision at discharge.  She is aware and has been here to attend therapies with Mom. She will talk with tonight and discuss about hiring caregiver, she does not want her daughter to spend her whole Christmas break providing care to grandmother.  Aware of her discharge tomorrow and Will also talk with step-father tonight.  Discharge set for tomorrow.

## 2014-10-31 NOTE — Progress Notes (Signed)
Occupational Therapy Discharge Summary And Treatment Sessions  Patient Details  Name: Teresa Martinez MRN: [id number] Date of Birth: 1941-05-11  Today's Date: 10/31/2014 OT Individual Time: 1105-1140 and 305-320 OT Individual Time Calculation (min): 35 min and 15 min  Skilled Intervention: 1)  Patient resting in w/c upon arrival.  Engaged in self care retraining to include shower, dress and groom.  Focused session on safety awareness, dynamic balance, functional mobility with all BADL tasks, increased use of RUE as stabilizer and gross assist, attention to right side of body, slow and controlled movements.  Patient requires mod cues to slow down, make safe decisions related to functional mobility, to use her RUE as a stabilizer or gross assist.  Patient in such a hurry, she wants to complete tasks as quickly as possible not necessarily as therapeutic as possible since this might take time and patience.    2)  Patient resting in bed upon arrival.  Engaged in RUE P/A/AAROM exercises seated EOB.  Patient sometimes states that she cannot perform yet with cues and time, she is able!  Encouraged her to slow down and be patient with herself.  Patient has met 10 of 11 long term goals due to improved activity tolerance, improved balance, ability to compensate for deficits and functional use of  RIGHT upper and RIGHT lower extremity.  Patient to discharge at overall Supervision level with BADL and Min-Supervision with IADL.  Patient's care partner is independent to provide the necessary physical and cognitive assistance at discharge.    Reasons goals not met: Patient did not meet Supervision for Homemaking tasks secondary to she requires min-steady assist to supervision.  Patient is unsafe and with LOB primarily due to decreased safety awareness and impulsivity.  Patient reports that her nature is to always complete everything she does very quickly---she is having a hard time slowing  down.  Recommendation:  Patient will benefit from ongoing skilled OT services in home health and outpatient to continue to advance functional skills in the area of BADL, iADL and Reduce care partner burden.  Equipment: No equipment provided  Reasons for discharge: discharge from hospital  Patient/family agrees with progress made and goals achieved: Yes  OT Discharge Precautions/Restrictions  Precautions Precautions: Fall Precaution Comments: R hemiparesis, impulsive Restrictions Weight Bearing Restrictions: No Pain Pain Assessment Pain Assessment: No/denies pain ADL Overall Supervision Vision/Perception  Vision- History Baseline Vision/History: Wears glasses Wears Glasses: At all times Patient Visual Report: No change from baseline Perception Comments: Some of patient's right visual inattention may be secondary to her need to complete tasks quickly instead of spending time to attend to and use her RUE.  Cognition Overall Cognitive Status: Impaired/Different from baseline Arousal/Alertness: Awake/alert Orientation Level: Oriented X4 Attention: Selective;Alternating Sustained Attention: Appears intact Selective Attention: Appears intact Alternating Attention: Impaired Alternating Attention Impairment: Functional basic Memory: Impaired Memory Impairment: Decreased recall of new information Awareness: Impaired Awareness Impairment: Anticipatory impairment Problem Solving: Impaired Problem Solving Impairment: Functional complex Behaviors: Impulsive Safety/Judgment: Impaired Comments: Pt continues to demonstrate impulsivity with all aspects of mobility, therefore continuing to recommend 24/7 S at time of D/C.  Sensation Sensation Light Touch: Appears Intact Stereognosis: Not tested Hot/Cold: Not tested Proprioception: Appears Intact;Not tested Coordination Gross Motor Movements are Fluid and Coordinated: No Fine Motor Movements are Fluid and Coordinated:  No Coordination and Movement Description: Pt continues to be limited with gross and fine motor coordination in RUE Motor  Motor Motor: Hemiplegia Motor - Discharge Observations: Pt with decreased strength in RLE, decreased  strenght and functional use of RUE, decreased balance.  Mobility  Bed Mobility Bed Mobility: Supine to Sit;Sit to Supine Supine to Sit: 6: Modified independent (Device/Increase time) Sit to Supine: 6: Modified independent (Device/Increase time) Transfers Sit to Stand: 5: Supervision Sit to Stand Details: Verbal cues for precautions/safety Stand to Sit: 5: Supervision Stand to Sit Details (indicate cue type and reason): Verbal cues for precautions/safety  Trunk/Postural Assessment  Cervical Assessment Cervical Assessment: Within Functional Limits Thoracic Assessment Thoracic Assessment: Within Functional Limits Lumbar Assessment Lumbar Assessment: Within Functional Limits Postural Control Postural Control: Deficits on evaluation Protective Responses: Continues to be delayed, but noting improved hip and stepping strategy Postural Limitations: Intermittent LOB to the R with min cues for increased R LE activation to prevent R lateral lean.   Balance Balance Assessed: Yes Standardized Balance Assessment Standardized Balance Assessment: Timed Up and Go Test performed by PT TUG: Normal TUG Normal TUG (seconds): 32.73 Dynamic Standing Balance Dynamic Standing - Balance Support: During functional activity Dynamic Standing - Level of Assistance: 5: Stand by assistance Dynamic Standing - Balance Activities: Lateral lean/weight shifting;Forward lean/weight shifting Extremity/Trunk Assessment RUE Assessment RUE Assessment: Exceptions to [facility] RUE AROM (degrees) RUE Overall AROM Comments: Patient able to activate all arm and hand movements however, shoulder flex & Abduct ~10 degrees before abnormal movement pattern begins (shoulder hike and trunk lateral lean).  Elbow and  forearm WFL. wrist limited with end ranges, lacks ability to fully form fist  RUE PROM (degrees) RUE Overall PROM Comments: shoulder PROM ~100 degrees before report of pain.  Patient reports left shoulder pain prior to admission, "probably rotator cuff". LUE Assessment LUE Assessment: Within Functional Limits  See FIM for current functional status  Aaliyan Brinkmeier 10/31/2014, 3:00 PM

## 2014-10-31 NOTE — Plan of Care (Signed)
Problem: RH SAFETY Goal: RH STG DECREASED RISK OF FALL WITH ASSISTANCE STG Decreased Risk of Fall With Assistance. Supervision  Outcome: Progressing Patient is very impulsive with transfers  Problem: RH SAFETY Goal: RH STG ADHERE TO SAFETY PRECAUTIONS W/ASSISTANCE/DEVICE STG Adhere to Safety Precautions With Assistance/Device.  Outcome: Progressing Bed alarm in use, patient is very impulsive with transfers

## 2014-10-31 NOTE — Progress Notes (Signed)
Social Work Patient ID: Teresa Martinez, female   DOB: 05-26-1941, 73 y.o.   MRN: [id number] E-mail from [facility] regarding weekend education with family felt [facility] would be more beneficial to begin with. Met with pt who is agreeable to this switch. No preference of agencies have made referral to [facility] and cancelled OP.  Preparing for discharge tomorrow.

## 2014-11-01 ENCOUNTER — Inpatient Hospital Stay (HOSPITAL_COMMUNITY): Payer: Medicare PPO | Admitting: Physical Therapy

## 2014-11-01 ENCOUNTER — Inpatient Hospital Stay (HOSPITAL_COMMUNITY): Payer: Medicare PPO | Admitting: Speech Pathology

## 2014-11-01 ENCOUNTER — Encounter (HOSPITAL_COMMUNITY): Payer: Medicare PPO | Admitting: Occupational Therapy

## 2014-11-01 MED ORDER — FESOTERODINE FUMARATE ER 4 MG PO TB24: 4.0000 mg | ORAL_TABLET | Freq: Every day | ORAL | Status: DC

## 2014-11-01 MED ORDER — ACETAMINOPHEN 325 MG PO TABS: 325.0000 mg | ORAL_TABLET | ORAL | Status: DC | PRN

## 2014-11-01 MED ORDER — ASPIRIN 325 MG PO TBEC: 325.0000 mg | DELAYED_RELEASE_TABLET | Freq: Every day | ORAL | Status: DC

## 2014-11-01 MED ORDER — DICLOFENAC SODIUM 1 % TD GEL: 2.0000 g | Freq: Four times a day (QID) | TRANSDERMAL | Status: DC

## 2014-11-01 NOTE — Discharge Summary (Signed)
Physician Discharge Summary  Patient ID: Teresa Martinez MRN: [id number] DOB/AGE: 75/02/1941 75 y.o.  Admit date: 10/19/2014 Discharge date: 11/01/2014  Discharge Diagnoses:  Principal Problem:   Left pontine stroke Active Problems:   Essential hypertension   Chronic right shoulder pain   Right rotator cuff tendonitis   Spastic neurogenic bladder   Discharged Condition: Stable.    Labs:  Basic Metabolic Panel: BMP Latest Ref Rng 10/26/2014 10/20/2014 10/16/2014  Glucose 70 - 99 mg/dL - 89 409(W121(H)  BUN 6 - 23 mg/dL - 17 13  Creatinine 1.190.50 - 1.10 mg/dL 1.470.77 8.290.82 5.620.70  Sodium 137 - 147 mEq/L - 144 140  Potassium 3.7 - 5.3 mEq/L - 4.0 3.7  Chloride 96 - 112 mEq/L - 107 107  CO2 19 - 32 mEq/L - 23 21  Calcium 8.4 - 10.5 mg/dL - 9.2 1.3(Y8.2(L)     CBC: CBC Latest Ref Rng 10/20/2014 10/14/2014 10/14/2014  WBC 4.0 - 10.5 K/uL 6.3 - 7.3  Hemoglobin 12.0 - 15.0 g/dL 86.512.6 78.413.6 69.612.4  Hematocrit 36.0 - 46.0 % 39.2 40.0 38.1  Platelets 150 - 400 K/uL 224 - 193     CBG: No results for input(s): GLUCAP in the last 168 hours.  Brief HPI:   Teresa Martinez is a 73 y.o. RH-female with history of HTN, hyperlipidemia who was admitted on 10/14/14 with acute onset of slurred speech and right sided weakness. Patient with sputtering symptoms and was treated with tPA due to recurrent nature of symptoms. CTA head/neck with 50% stenosis of R-ICA with luminal irregularity of bilateral ICA question fibromuscular dysplasia, severe calcific atherosclerosis of the carotid siphon and mild luminal irregularity of VA with extrinsic compression due to cervical spine degenerative changes. MRI/MRA brain done revealing acute left pontine infarct with lobular mass right parotid gland. CTNeck revealed right parotid mass and two right thyroid nodules without adenopathy. Patient was started on ASA for secondary stroke prevention. She had worsening of symptoms on 12/1 and was treated with bedrest as  well as IVF. She continues to have significant pain RUE as well as difficulty with RLE control. Patient with resultant right hemiparesis, balance deficits, dysarthria. CIR was recommended by MD and rehab team and patient admitted today.    Hospital Course: Teresa Martinez was admitted to rehab 10/19/2014 for inpatient therapies to consist of PT, ST and OT at least three hours five days a week. Past admission physiatrist, therapy team and rehab RN have worked together to provide customized collaborative inpatient rehab. Blood pressures have been monitored on tid basis and have been well controlled.  Right shoulder pain has greatly improved with education on positioning as well as addition of Voltaren gel. Po intake has improved and no signs of dysphagia noted. She is continent of bowel and bladder. She has had good gains during her rehab stay and is at supervision level due to safety concerns. Left AFO was ordered to help with gait quality and right foot weakness.  [facility] to provide HHPT, HHOT and HHRN for continued therapy past discharge.   Rehab course: During patient's stay in rehab weekly team conferences were held to monitor patient's progress, set goals and discuss barriers to discharge. Patient has had improvement in activity tolerance, balance, postural control, as well as ability to compensate for deficits. She is has had improvement in functional use RUE  and RLE as well as improved awareness. She is able to utilize speech strategies independently to help with speech clarity. She is  able to complete bathing and dressing tasks with supervision and set up assist to don AFO. She requires min to steady assist with home management tasks. She requires supervision with mobility. She is able to ambulate 150 feet with R AFO and quad cane. Family education was done with husband and daughter who will provide supervision needed past discharge.       Disposition: 01-Home or Self  Care  Diet:  Cardiac diet.   Special Instructions: 1. Wear PRAFO for ankle stabilization when walking    Medication List    STOP taking these medications        ALEVE 220 MG tablet  Generic drug:  naproxen sodium     estradiol 0.1 MG/GM vaginal cream  Commonly known as:  ESTRACE     lisinopril-hydrochlorothiazide 20-12.5 MG per tablet  Commonly known as:  PRINZIDE,ZESTORETIC     pravastatin 20 MG tablet  Commonly known as:  PRAVACHOL      TAKE these medications        acetaminophen 325 MG tablet  Commonly known as:  TYLENOL  Take 1-2 tablets (325-650 mg total) by mouth every 4 (four) hours as needed for mild pain.     aspirin 325 MG EC tablet  Take 1 tablet (325 mg total) by mouth daily.     b complex vitamins tablet  Take 1 tablet by mouth daily.     CALCIUM+D3 600-800 MG-UNIT Tabs  Generic drug:  Calcium Carb-Cholecalciferol  Take 1 tablet by mouth daily.     diclofenac sodium 1 % Gel  Commonly known as:  VOLTAREN  Apply 2 g topically 4 (four) times daily. To right shoulder     fesoterodine 4 MG Tb24 tablet  Commonly known as:  TOVIAZ  Take 1 tablet (4 mg total) by mouth daily.     Fish Oil 1000 MG Caps  Take 1,000 mg by mouth daily.     loratadine 10 MG tablet  Commonly known as:  CLARITIN  Take 10 mg by mouth daily.     multivitamin with minerals Tabs tablet  Take 1 tablet by mouth daily.     VITAMIN D PO  Take 1 tablet by mouth daily.           Follow-up Information    Follow up with Erick ColaceKIRSTEINS,ANDREW E, MD On 11/29/2014.   Specialty:  Physical Medicine and Rehabilitation   Why:  Be there at 9:30  for 10 am   appointment   Contact information:   [address] [place] [address] [phone]       Follow up with Delia HeadySETHI,PRAMOD, MD. Call today.   Specialties:  Neurology, Radiology   Why:  for follow up appointment in 2-4 weeks   Contact information:   [address] [place] [address] [phone]        Follow up with Rogelia BogaKWIATKOWSKI,PETER FRANK, MD On 11/14/2014.   Specialty:  Internal Medicine   Why:  APPT @ 4;00 PM for post op check and referral to ENT for thyroid/parathyroid nodules.    Contact information:   [address] [place] [address] [phone]       Signed: Jacquelynn CreeLove, Pamela S 11/01/2014, 4:29 PM

## 2014-11-01 NOTE — Progress Notes (Signed)
Social Work Discharge Note Discharge Note  The overall goal for the admission was met for:   Discharge location: [place], DAUGHTER AND GRANDDAUGHTER TO ASSIST WITH 24 HR CARE  Length of Stay: Yes-13 DAYS  Discharge activity level: Yes-SUPERVISION/MIN LEVEL  Home/community participation: Yes  Services provided included: MD, RD, PT, OT, SLP, RN, CM, TR, Pharmacy and [place]: Private Insurance: [place]  Follow-up services arranged: Home Health: [facility]-PT,OT,RN, DME: [employer] [facility] and Patient/Family has no preference for HH/DME agencies  Comments (or additional information):HUSBAND AND DAUGHTER HERE TO LEARN PT'S CARE=AWARE OF HER NEED FOR 24 HR CLOSE SUPERVISION LEVEL DUE TO IMPULSIVITY  Patient/Family verbalized understanding of follow-up arrangements: Yes  Individual responsible for coordination of the follow-up plan: PATIENT & JAMES-HUSBAND  Confirmed correct DME delivered: Elease Hashimoto 11/01/2014    Elease Hashimoto

## 2014-11-01 NOTE — Progress Notes (Signed)
Subjective/Complaints: Excited about D/C , no new issues Review of Systems - Negative except Right side weak  Objective: Vital Signs: Blood pressure 119/61, pulse 57, temperature 98.1 F (36.7 C), temperature source Oral, resp. rate 18, height 5\' 6"  (1.676 m), weight 63 kg (138 lb 14.2 oz), SpO2 98 %. No results found. No results found for this or any previous visit (from the past 72 hour(s)).    Mood/affect bright Neuro: Alert/Oriented,    hand/wrist, 4- delt, bi,tri and Dysarthric, 4/5 RLE, except 3- in foot/ankle Musc/Skel: no pain with AROM right shoulder, neg impingement sign Lungs clear Heart RRR no murmur abd soft non tender Ext No C/C/E  Gen NAD   Assessment/Plan: 1. Functional deficits secondary to Left pontine infarct causing R hemiparesis  Stable for D/C today F/u PCP in 1-2 weeks F/u PM&R 3 weeks See D/C summary See D/C instructions FIM: FIM - Bathing Bathing Steps Patient Completed: Chest, Right Arm, Left Arm, Abdomen, Front perineal area, Buttocks, Right upper leg, Left upper leg, Right lower leg (including foot), Left lower leg (including foot) Bathing: 5: Supervision: Safety issues/verbal cues  FIM - Upper Body Dressing/Undressing Upper body dressing/undressing steps patient completed: Thread/unthread right sleeve of pullover shirt/dresss, Thread/unthread left sleeve of pullover shirt/dress, Put head through opening of pull over shirt/dress, Pull shirt over trunk Upper body dressing/undressing: 5: Supervision: Safety issues/verbal cues FIM - Lower Body Dressing/Undressing Lower body dressing/undressing steps patient completed: Thread/unthread right underwear leg, Thread/unthread left underwear leg, Pull underwear up/down, Thread/unthread right pants leg, Thread/unthread left pants leg, Pull pants up/down, Don/Doff right sock, Don/Doff left sock, Don/Doff right shoe, Don/Doff left shoe, Fasten/unfasten right shoe, Fasten/unfasten left shoe Lower body  dressing/undressing: 5: Set-up assist to: Don/Doff AFO/prosthesis/orthosis  FIM - Toileting Toileting steps completed by patient: Adjust clothing prior to toileting, Performs perineal hygiene, Adjust clothing after toileting Toileting Assistive Devices: Grab bar or rail for support Toileting: 5: Supervision: Safety issues/verbal cues  FIM - Diplomatic Services operational officerToilet Transfers Toilet Transfers Assistive Devices: Occupational hygienistCane Toilet Transfers: 5-To toilet/BSC: Supervision (verbal cues/safety issues), 5-From toilet/BSC: Supervision (verbal cues/safety issues)  FIM - BankerBed/Chair Transfer Bed/Chair Transfer Assistive Devices: Arm rests, Teacher, musicCane Bed/Chair Transfer: 6: Supine > Sit: No assist, 6: Sit > Supine: No assist, 5: Bed > Chair or W/C: Supervision (verbal cues/safety issues), 5: Chair or W/C > Bed: Supervision (verbal cues/safety issues)  FIM - Locomotion: Wheelchair Distance: 150 Locomotion: Wheelchair: 6: Travels 150 ft or more, turns around, maneuvers to table, bed or toilet, negotiates 3% grade: maneuvers on rugs and over door sills independently FIM - Locomotion: Ambulation Locomotion: Ambulation Assistive Devices: Occupational hygienistCane - Quad Ambulation/Gait Assistance: 5: Supervision Locomotion: Ambulation: 5: Travels 150 ft or more with supervision/safety issues  Comprehension Comprehension Mode: Auditory Comprehension: 7-Follows complex conversation/direction: With no assist  Expression Expression Mode: Verbal Expression: 6-Expresses complex ideas: With extra time/assistive device  Social Interaction Social Interaction: 6-Interacts appropriately with others with medication or extra time (anti-anxiety, antidepressant).  Problem Solving Problem Solving: 5-Solves complex 90% of the time/cues < 10% of the time  Memory Memory: 6-Assistive device: No helper  Medical Problem List and Plan: 1. Functional deficits secondary to Left pontine infarct.   2.  DVT Prophylaxis/Anticoagulation: Pharmaceutical: Lovenox 3. Pain  Management: Oxycodone prn for right shoulder pain. Ice as well as elevation for symptom management.   4. Mood: Reports of anxiety with adjustment reaction. Team to provide ego support. LCSW to follow for evaluation and support. Will consult chaplain services for follow up visit.   5.  Neuropsych: This patient is capable of making decisions on her own behalf. 6. Skin/Wound Care: Routine pressure relief measures.   7. Fluids/Electrolytes/Nutrition: Monitor I/O. Offer nutritonal supplements as needed if intake poor. Husband encouraged to bring food from home.   8. HTN: Monitor every 8 hours. Off prinizide--avoid hypotension to allow adequate perfusion.   9. Right shoulder pain: likely due to RTC pathology: Negative X rays , voltaren gel, KPad S/p RIght subacromial injection on 12/7 12mg  celestone with 3cc 2% lidocaine Pt with reduced rest pain  -exercises with PT/OT  10. Right Thyroid nodules/Parotid nodule: Follow up with ENT on outpatient basis 11. Hyperlipidemia: unable to tolerate statins. diet  12.  Spastic bladder-trial oxybutnin- helpful thus far.  WIll provide Rx at D/C in am LOS (Days) 13 A FACE TO FACE EVALUATION WAS PERFORMED  KIRSTEINS,ANDREW E 11/01/2014, 8:25 AM

## 2014-11-01 NOTE — Discharge Instructions (Signed)
Inpatient Rehab Discharge Instructions  Dennard SchaumannFrances H Mintz-Whitcomb Discharge date and time:  11/01/14  Activities/Precautions/ Functional Status: Activity: activity as tolerated Diet: cardiac diet Wound Care: none needed Functional status:  ___ No restrictions     ___ Walk up steps independently _X__ 24/7 supervision/assistance   ___ Walk up steps with assistance ___ Intermittent supervision/assistance  ___ Bathe/dress independently _X__ Walk with hemiwalker    _X__ Bathe/dress with assistance ___ Walk Independently    ___ Shower independently ___ Walk with assistance    ___ Shower with assistance _X__ No alcohol     ___ Return to work/school ________  Special Instructions: 1. Wear PRAFO for ankle stabilization when walking.    COMMUNITY REFERRALS UPON DISCHARGE:    Home Health:   PT, OT,  RN  Agency:[facility] [phone] Date of last service:11/01/2014    Medical Equipment/Items Ordered:LBQC  Agency/Supplier:[employer]  [phone] Other:PRIVATED DUTY LIST FAMILY TO FOLLOW UP ON   GENERAL COMMUNITY RESOURCES FOR PATIENT/FAMILY: Support Groups:[facility] SUPPORT GROUP  STROKE/TIA DISCHARGE INSTRUCTIONS SMOKING Cigarette smoking nearly doubles your risk of having a stroke & is the single most alterable risk factor  If you smoke or have smoked in the last 12 months, you are advised to quit smoking for your health.  Most of the excess cardiovascular risk related to smoking disappears within a year of stopping.  Ask you doctor about anti-smoking medications  [place] Quit Line: 1-800-QUIT NOW  Free Smoking Cessation Classes [phone]  CHOLESTEROL Know your levels; limit fat & cholesterol in your diet  Lipid Panel     Component Value Date/Time   CHOL 215* 10/15/2014 0150   TRIG 158* 10/15/2014 0150   HDL 51 10/15/2014 0150   CHOLHDL 4.2 10/15/2014 0150   VLDL 32 10/15/2014 0150   LDLCALC 132* 10/15/2014 0150      Many patients benefit from treatment even  if their cholesterol is at goal.  Goal: Total Cholesterol (CHOL) less than 160  Goal:  Triglycerides (TRIG) less than 150  Goal:  HDL greater than 40  Goal:  LDL (LDLCALC) less than 100   BLOOD PRESSURE American Stroke Association blood pressure target is less that 120/80 mm/Hg  Your discharge blood pressure is:  BP: (!) 137/52 mmHg  Monitor your blood pressure  Limit your salt and alcohol intake  Many individuals will require more than one medication for high blood pressure  DIABETES (A1c is a blood sugar average for last 3 months) Goal HGBA1c is under 7% (HBGA1c is blood sugar average for last 3 months)  Diabetes: No known diagnosis of diabetes    Lab Results  Component Value Date   HGBA1C 6.0* 10/15/2014     Your HGBA1c can be lowered with medications, healthy diet, and exercise.  Check your blood sugar as directed by your physician  Call your physician if you experience unexplained or low blood sugars.  PHYSICAL ACTIVITY/REHABILITATION Goal is 30 minutes at least 4 days per week  Activity: No driving, Therapies: See above Return to work: to be decided on follow up.   Activity decreases your risk of heart attack and stroke and makes your heart stronger.  It helps control your weight and blood pressure; helps you relax and can improve your mood.  Participate in a regular exercise program.  Talk with your doctor about the best form of exercise for you (dancing, walking, swimming, cycling).  DIET/WEIGHT Goal is to maintain a healthy weight  Your discharge diet is: Diet Heart  thin liquids Your height is:  Height: 5\' 6"  (167.6 cm) Your current weight is: Weight: 64.4 kg (141 lb 15.6 oz) Your Body Mass Index (BMI) is:  BMI (Calculated): 23  Following the type of diet specifically designed for you will help prevent another stroke.  Your are at goal weight  Your goal Body Mass Index (BMI) is 19-24.  Healthy food habits can help reduce 3 risk factors for stroke:  High  cholesterol, hypertension, and excess weight.  RESOURCES Stroke/Support Group:  Call [phone]   STROKE EDUCATION PROVIDED/REVIEWED AND GIVEN TO PATIENT Stroke warning signs and symptoms How to activate emergency medical system (call 911). Medications prescribed at discharge. Need for follow-up after discharge. Personal risk factors for stroke. Pneumonia vaccine given:  Flu vaccine given:  My questions have been answered, the writing is legible, and I understand these instructions.  I will adhere to these goals & educational materials that have been provided to me after my discharge from the hospital.       My questions have been answered and I understand these instructions. I will adhere to these goals and the provided educational materials after my discharge from the hospital.  Patient/Caregiver Signature _______________________________ Date __________  Clinician Signature _______________________________________ Date __________  Please bring this form and your medication list with you to all your follow-up doctor's appointments.

## 2014-11-03 ENCOUNTER — Encounter: Payer: Medicare PPO | Admitting: Speech Pathology

## 2014-11-03 ENCOUNTER — Ambulatory Visit: Payer: Medicare PPO

## 2014-11-03 ENCOUNTER — Ambulatory Visit: Payer: Medicare PPO | Admitting: Occupational Therapy

## 2014-11-04 NOTE — Progress Notes (Signed)
Patient discharge teaching given, including activity, diet, follow-up appoints, and medications, rehab PA. No IV in place. Vitals are stable. Skin is intact except as charted in most recent assessments. Pt to be escorted out by RN, to be driven home by family.  Peri MarisAndrew Fatmata Legere, MBA, BS, RN

## 2014-11-14 ENCOUNTER — Encounter: Payer: Self-pay | Admitting: Internal Medicine

## 2014-11-14 ENCOUNTER — Ambulatory Visit (INDEPENDENT_AMBULATORY_CARE_PROVIDER_SITE_OTHER): Payer: Medicare PPO | Admitting: Internal Medicine

## 2014-11-14 VITALS — BP 140/90 | HR 72 | Temp 98.2°F | Resp 20 | Ht 66.0 in | Wt 139.0 lb

## 2014-11-14 DIAGNOSIS — I10 Essential (primary) hypertension: Secondary | ICD-10-CM

## 2014-11-14 DIAGNOSIS — I638 Other cerebral infarction: Secondary | ICD-10-CM

## 2014-11-14 DIAGNOSIS — I6389 Other cerebral infarction: Secondary | ICD-10-CM

## 2014-11-14 DIAGNOSIS — M7581 Other shoulder lesions, right shoulder: Secondary | ICD-10-CM

## 2014-11-14 NOTE — Progress Notes (Signed)
Subjective:    Patient ID: Teresa Martinez, female    DOB: 10/19/1941, 73 y.o.   MRN: [id number]  HPI  Teresa Martinez is a 73 y.o. RH-female with history of HTN, hyperlipidemia who was admitted on 10/14/14 with acute onset of slurred speech and right sided weakness. Patient with sputtering symptoms and was treated with tPA due to recurrent nature of symptoms. CTA head/neck with 50% stenosis of R-ICA with luminal irregularity of bilateral ICA question fibromuscular dysplasia, severe calcific atherosclerosis of the carotid siphon and mild luminal irregularity of VA with extrinsic compression due to cervical spine degenerative changes. MRI/MRA brain done revealing acute left pontine infarct with lobular mass right parotid gland. CTNeck revealed right parotid mass and two right thyroid nodules without adenopathy. Patient was started on ASA for secondary stroke prevention.   73 year old patient who was recently admitted to the hospital for a left pontine stroke.  She was transferred for CIR and continues to make nice progress with home physical therapy.  Hospital records reviewed  Imaging studies suggested a right parotid gland mass.  Patient states that she has had a right parotidectomy in the past.  She has a history of hypertension but was discharged off antihypertensives.  She also has a history of a statin intolerance.  Past Medical History  Diagnosis Date  . Hyperlipemia   . Statin intolerance   . Menopausal syndrome     [name](Sherry WorthamDickstein)  . Hypertension   . Overactive bladder   . Cervical spine degeneration     C5/C6  . Chronic right shoulder pain     History   Social History  . Marital Status: Married    Spouse Name: N/A    Number of Children: N/A  . Years of Education: N/A   Occupational History  . Not on file.   Social History Main Topics  . Smoking status: Never Smoker   . Smokeless tobacco: Never Used  . Alcohol Use: No  . Drug Use: No  .  Sexual Activity: Not on file   Other Topics Concern  . Not on file   Social History Narrative   Regular exercise, 4 children, 6 grandchildren, one stepchild and two additional stepgrandchildren    Past Surgical History  Procedure Laterality Date  . Cholecystectomy      1950  . Band hemorrhoidectomy      2010  . Parotidectomy      30 years ago  . Abdominal hysterectomy      1985    Family History  Problem Relation Age of Onset  . Heart failure Father   . Diabetes Mother   . Hypertension Mother   . Stroke Mother   . Cirrhosis Sister   . Hyperlipidemia Brother   . Hypertension Brother     Allergies  Allergen Reactions  . Latex Other (See Comments)    Burns her skin  . Pravastatin Other (See Comments)    Leg pains    Current Outpatient Prescriptions on File Prior to Visit  Medication Sig Dispense Refill  . acetaminophen (TYLENOL) 325 MG tablet Take 1-2 tablets (325-650 mg total) by mouth every 4 (four) hours as needed for mild pain.    Marland Kitchen. aspirin EC 325 MG EC tablet Take 1 tablet (325 mg total) by mouth daily. 100 tablet 1  . fesoterodine (TOVIAZ) 4 MG TB24 tablet Take 1 tablet (4 mg total) by mouth daily. 30 tablet 1  . b complex vitamins tablet Take 1 tablet by mouth  daily.    . Calcium Carb-Cholecalciferol (CALCIUM+D3) 600-800 MG-UNIT TABS Take 1 tablet by mouth daily.    . Cholecalciferol (VITAMIN D PO) Take 1 tablet by mouth daily.    . diclofenac sodium (VOLTAREN) 1 % GEL Apply 2 g topically 4 (four) times daily. To right shoulder (Patient not taking: Reported on 11/14/2014) 4 Tube 1  . loratadine (CLARITIN) 10 MG tablet Take 10 mg by mouth daily.    . Multiple Vitamin (MULTIVITAMIN WITH MINERALS) TABS tablet Take 1 tablet by mouth daily.    . Omega-3 Fatty Acids (FISH OIL) 1000 MG CAPS Take 1,000 mg by mouth daily.    . [DISCONTINUED] fesoterodine (TOVIAZ) 4 MG TB24 Take 1 tablet (4 mg total) by mouth daily. 30 tablet 3   No current facility-administered  medications on file prior to visit.    BP 140/90 mmHg  Pulse 72  Temp(Src) 98.2 F (36.8 C) (Oral)  Resp 20  Ht 5\' 6"  (1.676 m)  Wt 139 lb (63.05 kg)  BMI 22.45 kg/m2  SpO2 97%     Review of Systems  HENT: Negative for congestion, dental problem, hearing loss, rhinorrhea, sinus pressure, sore throat and tinnitus.   Eyes: Negative for pain, discharge and visual disturbance.  Respiratory: Negative for cough and shortness of breath.   Cardiovascular: Negative for chest pain, palpitations and leg swelling.  Gastrointestinal: Negative for nausea, vomiting, abdominal pain, diarrhea, constipation, blood in stool and abdominal distention.  Genitourinary: Negative for dysuria, urgency, frequency, hematuria, flank pain, vaginal bleeding, vaginal discharge, difficulty urinating, vaginal pain and pelvic pain.  Musculoskeletal: Negative for joint swelling, arthralgias and gait problem.  Skin: Negative for rash.  Neurological: Positive for weakness. Negative for dizziness, syncope, speech difficulty, numbness and headaches.  Hematological: Negative for adenopathy.  Psychiatric/Behavioral: Negative for behavioral problems, dysphoric mood and agitation. The patient is not nervous/anxious.        Objective:   Physical Exam  Constitutional: She is oriented to person, place, and time. She appears well-developed and well-nourished.  Blood pressure 140/82  HENT:  Head: Normocephalic.  Right Ear: External ear normal.  Left Ear: External ear normal.  Mouth/Throat: Oropharynx is clear and moist.  Surgical scar in the right preauricular area.  No obvious parotid mass palpable  Eyes: Conjunctivae and EOM are normal. Pupils are equal, round, and reactive to light.  Neck: Normal range of motion. Neck supple. No thyromegaly present.  Cardiovascular: Normal rate, regular rhythm, normal heart sounds and intact distal pulses.   Pulmonary/Chest: Effort normal and breath sounds normal.  Abdominal: Soft.  Bowel sounds are normal. She exhibits no mass. There is no tenderness.  Musculoskeletal: Normal range of motion.  Lymphadenopathy:    She has no cervical adenopathy.  Neurological: She is alert and oriented to person, place, and time.  Right sided weakness arm affected greater than the leg  Skin: Skin is warm and dry. No rash noted.  Psychiatric: She has a normal mood and affect. Her behavior is normal.          Assessment & Plan:   S/p L Pontine CVA HTN- stable off meds HLD ? R parotid mass  Recheck 4 weeks; consider CT necj

## 2014-11-14 NOTE — Patient Instructions (Signed)
Limit your sodium (Salt) intake  Please check your blood pressure on a regular basis.  If it is consistently greater than 150/90, please make an office appointment.  Return in one month for follow-up  

## 2014-11-14 NOTE — Progress Notes (Signed)
Pre visit review using our clinic review tool, if applicable. No additional management support is needed unless otherwise documented below in the visit note. 

## 2014-11-21 DIAGNOSIS — I10 Essential (primary) hypertension: Secondary | ICD-10-CM

## 2014-11-21 DIAGNOSIS — M7531 Calcific tendinitis of right shoulder: Secondary | ICD-10-CM | POA: Diagnosis not present

## 2014-11-21 DIAGNOSIS — N319 Neuromuscular dysfunction of bladder, unspecified: Secondary | ICD-10-CM | POA: Diagnosis not present

## 2014-11-21 DIAGNOSIS — M25511 Pain in right shoulder: Secondary | ICD-10-CM | POA: Diagnosis not present

## 2014-11-21 DIAGNOSIS — E785 Hyperlipidemia, unspecified: Secondary | ICD-10-CM

## 2014-11-21 DIAGNOSIS — I69351 Hemiplegia and hemiparesis following cerebral infarction affecting right dominant side: Secondary | ICD-10-CM | POA: Diagnosis not present

## 2014-11-29 ENCOUNTER — Inpatient Hospital Stay: Payer: Medicare PPO | Admitting: Physical Medicine & Rehabilitation

## 2014-11-29 ENCOUNTER — Telehealth: Payer: Self-pay

## 2014-11-29 NOTE — Telephone Encounter (Signed)
[name] - [facility] [phone] - Fax  [name] called to see if he could get an order for Out Patient Therapy on Teresa Martinez for OT and PT

## 2014-11-30 ENCOUNTER — Telehealth: Payer: Self-pay | Admitting: Internal Medicine

## 2014-11-30 ENCOUNTER — Ambulatory Visit: Payer: Medicare PPO | Admitting: Neurology

## 2014-11-30 NOTE — Telephone Encounter (Signed)
Left detailed message for Natalia LeatherwoodKatherine on her voicemail, verbal order given to continue Occupational Therapy for twice a week for one week, okay per Dr. Kirtland BouchardK.

## 2014-11-30 NOTE — Telephone Encounter (Signed)
Standley Dakinsalled Jeff and gave verbal order for OT and PT. He stated that patient would like to go to the facility on [address].  Can we set that up for the patient? Will message Barbara CowerJason for referral.

## 2014-11-30 NOTE — Telephone Encounter (Signed)
Dr. Kirtland BouchardK, okay to continue Occupational Therapy?

## 2014-11-30 NOTE — Telephone Encounter (Signed)
ok 

## 2014-11-30 NOTE — Telephone Encounter (Signed)
Teresa Martinez Occupational therapist from [facility] is requesting more visits twice a wk for 1 wk. Please call with verbal

## 2014-12-05 ENCOUNTER — Ambulatory Visit (HOSPITAL_BASED_OUTPATIENT_CLINIC_OR_DEPARTMENT_OTHER): Payer: Medicare PPO | Admitting: Physical Medicine & Rehabilitation

## 2014-12-05 ENCOUNTER — Encounter: Payer: Self-pay | Admitting: Physical Medicine & Rehabilitation

## 2014-12-05 ENCOUNTER — Encounter: Payer: Medicare PPO | Attending: Physical Medicine & Rehabilitation

## 2014-12-05 VITALS — BP 134/85 | HR 85 | Resp 14

## 2014-12-05 DIAGNOSIS — R262 Difficulty in walking, not elsewhere classified: Secondary | ICD-10-CM | POA: Diagnosis not present

## 2014-12-05 DIAGNOSIS — G8111 Spastic hemiplegia affecting right dominant side: Secondary | ICD-10-CM

## 2014-12-05 DIAGNOSIS — R279 Unspecified lack of coordination: Secondary | ICD-10-CM | POA: Insufficient documentation

## 2014-12-05 NOTE — Patient Instructions (Signed)
May ask Dr Kirtland BouchardK about Crestor

## 2014-12-05 NOTE — Progress Notes (Signed)
Subjective:    Patient ID: Teresa Martinez, female    DOB: 01/30/1941, 74 y.o.   MRN: [id number]  HPI 74 y.o. RH-female with history of HTN, hyperlipidemia who was admitted on 10/14/14 with acute onset of slurred speech and right sided weakness. Patient with sputtering symptoms and was treated with tPA due to recurrent nature of symptoms. CTA head/neck with 50% stenosis of R-ICA with luminal irregularity of bilateral ICA question fibromuscular dysplasia, severe calcific atherosclerosis of the carotid siphon and mild luminal irregularity of VA with extrinsic compression due to cervical spine degenerative changes. MRI/MRA brain done revealing acute left pontine infarct with lobular mass right parotid gland Admit date: 10/19/2014 Discharge date: 11/01/2014  Currently receiving home health therapy. PT and OT. Using a large-based quad cane for ambulation. Sometimes does not use this at home No falls at home. Dressing and bathing independently. Has had follow-up with primary care physician  Home health therapies have just finished last week Still has some right shoulder pain. Doing exercises for this. Pain Inventory Average Pain 0 Pain Right Now 0 My pain is no pain  In the last 24 hours, has pain interfered with the following? General activity 0 Relation with others 0 Enjoyment of life 0 What TIME of day is your pain at its worst? no pain Sleep (in general) NA  Pain is worse with: no pain Pain improves with: no pain Relief from Meds: no pain  Mobility walk with assistance use a cane  Function retired  Neuro/Psych bladder control problems  Prior Studies Any changes since last visit?  no  Physicians involved in your care Any changes since last visit?  no   Family History  Problem Relation Age of Onset  . Heart failure Father   . Diabetes Mother   . Hypertension Mother   . Stroke Mother   . Cirrhosis Sister   . Hyperlipidemia Brother   . Hypertension Brother     History   Social History  . Marital Status: Married    Spouse Name: N/A    Number of Children: N/A  . Years of Education: N/A   Social History Main Topics  . Smoking status: Never Smoker   . Smokeless tobacco: Never Used  . Alcohol Use: No  . Drug Use: No  . Sexual Activity: None   Other Topics Concern  . None   Social History Narrative   Regular exercise, 4 children, 6 grandchildren, one stepchild and two additional stepgrandchildren   Past Surgical History  Procedure Laterality Date  . Cholecystectomy      1950  . Band hemorrhoidectomy      2010  . Parotidectomy      30 years ago  . Abdominal hysterectomy      1985   Past Medical History  Diagnosis Date  . Hyperlipemia   . Statin intolerance   . Menopausal syndrome     [name]  . Hypertension   . Overactive bladder   . Cervical spine degeneration     C5/C6  . Chronic right shoulder pain    BP 134/85 mmHg  Pulse 85  Resp 14  SpO2 96%  Opioid Risk Score:   Fall Risk Score: Moderate Fall Risk (6-13 points) (educated and givne handout)  Review of Systems  Genitourinary:       Bladder control problems  All other systems reviewed and are negative.      Objective:   Physical Exam  Constitutional: She is oriented to person, place,  and time. She appears well-developed and well-nourished.  HENT:  Head: Normocephalic and atraumatic.  Eyes: Conjunctivae are normal. Pupils are equal, round, and reactive to light.  Neck: Normal range of motion.  Musculoskeletal:  Right shoulder pain with abduction and forward flexion.  Neurological: She is alert and oriented to person, place, and time. A sensory deficit is present.  Reflex Scores:      Tricep reflexes are 3+ on the right side and 2+ on the left side.      Bicep reflexes are 3+ on the right side.      Brachioradialis reflexes are 3+ on the right side and 2+ on the left side.      Patellar reflexes are 3+ on the right side and 2+ on the left  side.      Achilles reflexes are 3+ on the right side and 2+ on the left side. Motor strength is 3 minus at the right deltoid, 4 minus at the biceps triceps 3 minus at the grip  3 minus at the ankle dorsiflexor 4  at the hip flexor and knee extensor  5 strength on the left side  Sensation reduced at the tips of the fingers on the right side  Psychiatric: She has a normal mood and affect.  Nursing note and vitals reviewed.   Ambulates without toe drag or knee instability, wide basis support Wrist right dorsal ganglion nontender to palpation. Fingers mildly swollen but no tenderness to palpation no discoloration.     Assessment & Plan:  1. Left pontine infarct with right hemiparesis. Making good improvements with home health therapy but now discharged. Recommend outpatient PT and OT to 3 times per week We'll make a referral Return to clinic one month Still advised no driving

## 2014-12-07 ENCOUNTER — Ambulatory Visit (INDEPENDENT_AMBULATORY_CARE_PROVIDER_SITE_OTHER): Payer: Medicare PPO | Admitting: Neurology

## 2014-12-07 ENCOUNTER — Encounter: Payer: Self-pay | Admitting: Neurology

## 2014-12-07 VITALS — BP 148/81 | HR 86

## 2014-12-07 DIAGNOSIS — I6350 Cerebral infarction due to unspecified occlusion or stenosis of unspecified cerebral artery: Secondary | ICD-10-CM

## 2014-12-07 DIAGNOSIS — I639 Cerebral infarction, unspecified: Secondary | ICD-10-CM

## 2014-12-07 DIAGNOSIS — I69359 Hemiplegia and hemiparesis following cerebral infarction affecting unspecified side: Secondary | ICD-10-CM | POA: Insufficient documentation

## 2014-12-07 HISTORY — DX: Hemiplegia and hemiparesis following cerebral infarction affecting unspecified side: I69.359

## 2014-12-07 NOTE — Patient Instructions (Signed)
Stroke Prevention Some medical conditions and behaviors are associated with an increased chance of having a stroke. You may prevent a stroke by making healthy choices and managing medical conditions. HOW CAN I REDUCE MY RISK OF HAVING A STROKE?   Stay physically active. Get at least 30 minutes of activity on most or all days.  Do not smoke. It may also be helpful to avoid exposure to secondhand smoke.  Limit alcohol use. Moderate alcohol use is considered to be:  No more than 2 drinks per day for men.  No more than 1 drink per day for nonpregnant women.  Eat healthy foods. This involves:  Eating 5 or more servings of fruits and vegetables a day.  Making dietary changes that address high blood pressure (hypertension), high cholesterol, diabetes, or obesity.  Manage your cholesterol levels.  Making food choices that are high in fiber and low in saturated fat, trans fat, and cholesterol may control cholesterol levels.  Take any prescribed medicines to control cholesterol as directed by your health care provider.  Manage your diabetes.  Controlling your carbohydrate and sugar intake is recommended to manage diabetes.  Take any prescribed medicines to control diabetes as directed by your health care provider.  Control your hypertension.  Making food choices that are low in salt (sodium), saturated fat, trans fat, and cholesterol is recommended to manage hypertension.  Take any prescribed medicines to control hypertension as directed by your health care provider.  Maintain a healthy weight.  Reducing calorie intake and making food choices that are low in sodium, saturated fat, trans fat, and cholesterol are recommended to manage weight.  Stop drug abuse.  Avoid taking birth control pills.  Talk to your health care provider about the risks of taking birth control pills if you are over 35 years old, smoke, get migraines, or have ever had a blood clot.  Get evaluated for sleep  disorders (sleep apnea).  Talk to your health care provider about getting a sleep evaluation if you snore a lot or have excessive sleepiness.  Take medicines only as directed by your health care provider.  For some people, aspirin or blood thinners (anticoagulants) are helpful in reducing the risk of forming abnormal blood clots that can lead to stroke. If you have the irregular heart rhythm of atrial fibrillation, you should be on a blood thinner unless there is a good reason you cannot take them.  Understand all your medicine instructions.  Make sure that other conditions (such as anemia or atherosclerosis) are addressed. SEEK IMMEDIATE MEDICAL CARE IF:   You have sudden weakness or numbness of the face, arm, or leg, especially on one side of the body.  Your face or eyelid droops to one side.  You have sudden confusion.  You have trouble speaking (aphasia) or understanding.  You have sudden trouble seeing in one or both eyes.  You have sudden trouble walking.  You have dizziness.  You have a loss of balance or coordination.  You have a sudden, severe headache with no known cause.  You have new chest pain or an irregular heartbeat. Any of these symptoms may represent a serious problem that is an emergency. Do not wait to see if the symptoms will go away. Get medical help at once. Call your local emergency services (911 in [place] Do not drive yourself to the hospital. Document Released: 12/12/2004 Document Revised: 03/21/2014 Document Reviewed: 05/07/2013 [employer] Patient Information 2015 [employer]. This information is not intended to replace advice given   to you by your health care provider. Make sure you discuss any questions you have with your health care provider.  

## 2014-12-07 NOTE — Progress Notes (Signed)
Reason for visit: Stroke  Teresa Martinez is a 74 y.o. female  History of present illness:  Teresa Martinez is a 74 year old right-handed black female with a history of hypertension, and dyslipidemia who was admitted to the hospital on 10/14/2014. The patient sustained a right hemiparesis associated with a left pontine stroke. The patient did receive TPA, and she was eventually was admitted to the inpatient rehabilitation unit. The patient is now back home since 11/01/2014, and she is getting home health physical and occupational therapy. The patient has continued make gains with her right-sided weakness. She will be going to outpatient rehabilitation in the near future. The patient is on aspirin currently. She has not been able to tolerate any statin medications in the past. Her LDL level was around 125. The patient walks with a quad cane, she has not had any falls. She denies any significant numbness on the right side of the body with exception of some mild numbness of the right hand. The patient denies any issues with swallowing or choking. She denies any double vision or loss of vision. She is sent to this office for further evaluation.  Past Medical History  Diagnosis Date  . Hyperlipemia   . Statin intolerance   . Menopausal syndrome     [name](Sherry [facility] [place])  . Hypertension   . Overactive bladder   . Cervical spine degeneration     C5/C6  . Chronic right shoulder pain   . Hemiparesis affecting dominant side as late effect of cerebrovascular accident 12/07/2014    Past Surgical History  Procedure Laterality Date  . Cholecystectomy      1950  . Band hemorrhoidectomy      2010  . Parotidectomy      30 years ago  . Abdominal hysterectomy      1985    Family History  Problem Relation Age of Onset  . Heart failure Father   . Diabetes Mother   . Hypertension Mother   . Stroke Mother   . Cirrhosis Sister   . Hyperlipidemia Brother   . Hypertension Brother   .  Stroke Brother   . Diabetes Brother     Social history:  reports that she has never smoked. She has never used smokeless tobacco. She reports that she does not drink alcohol or use illicit drugs.  Medications:  Current Outpatient Prescriptions on File Prior to Visit  Medication Sig Dispense Refill  . aspirin EC 325 MG EC tablet Take 1 tablet (325 mg total) by mouth daily. 100 tablet 1  . fesoterodine (TOVIAZ) 4 MG TB24 tablet Take 1 tablet (4 mg total) by mouth daily. 30 tablet 1  . [DISCONTINUED] fesoterodine (TOVIAZ) 4 MG TB24 Take 1 tablet (4 mg total) by mouth daily. 30 tablet 3   No current facility-administered medications on file prior to visit.      Allergies  Allergen Reactions  . Latex Other (See Comments)    Burns her skin  . Pravastatin Other (See Comments)    Leg pains    ROS:  Out of a complete 14 system review of symptoms, the patient complains only of the following symptoms, and all other reviewed systems are negative.  Ringing in the ears Gait disturbance Right-sided weakness  Blood pressure 148/81, pulse 86.  Physical Exam  General: The patient is alert and cooperative at the time of the examination.  Eyes: Pupils are equal, round, and reactive to light. Discs are flat bilaterally.  Neck: The neck is  supple, no carotid bruits are noted.  Respiratory: The respiratory examination is clear.  Cardiovascular: The cardiovascular examination reveals a regular rate and rhythm, no obvious murmurs or rubs are noted.  Skin: Extremities are without significant edema.  Neurologic Exam  Mental status: The patient is alert and oriented x 3 at the time of the examination. The patient has apparent normal recent and remote memory, with an apparently normal attention span and concentration ability.  Cranial nerves: Facial symmetry is not present. There is a mild compression the left nasolabial fold. There is good sensation of the face to pinprick and soft touch  bilaterally. The strength of the facial muscles and the muscles to head turning and shoulder shrug are normal bilaterally. Speech is slightly dysarthric. Extraocular movements are full. Visual fields are full. The tongue is midline, and the patient has symmetric elevation of the soft palate. No obvious hearing deficits are noted.  Motor: The motor testing reveals 5 over 5 strength of the left extremities. There is 4/5 grip strength on the right hand, 4+/5 strength proximally in the right arm 4+/5 strength is noted in the right leg. Good symmetric motor tone is noted throughout.  Sensory: Sensory testing is intact to pinprick, soft touch, vibration sensation, and position sense on all 4 extremities, with exception of slight decreased pinprick sensation on the right hand relative to the left. No evidence of extinction is noted.  Coordination: Cerebellar testing reveals good finger-nose-finger and heel-to-shin on the left, mild dysmetria with finger-nose-finger and heel-to-shin on the right.  Gait and station: Gait is associated with a circumduction type gait with the right leg. The patient uses a quad cane for ambulation. The patient actually is able to perform tandem gait. Romberg is negative.  Reflexes: Deep tendon reflexes are notable for slight increase in the right biceps and right knee jerk reflex. Toes are downgoing on the left, upgoing on the right.   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 Recommend CT neck with contrast for further evaluation. Neoplasm and lymphoma in the differential.  Moderate to severe intracranial atherosclerotic disease. No large vessel occlusion.  * The MRI and MRA images were reviewed online.   2D echo 10/15/14:  Study Conclusions  - Left ventricle: The cavity size was normal.  Wall thickness was normal. Systolic function was normal. The estimated ejection fraction was in the range of 55% to 60%. Wall motion was normal; there were no regional wall motion abnormalities. Features are consistent with a pseudonormal left ventricular filling pattern, with concomitant abnormal relaxation and increased filling pressure (grade 2 diastolic dysfunction). - Atrial septum: There was redundancy of the septum, with borderline criteria for aneurysm.   Carotid doppler 10/15/14:  Summary:  - Mild technical difficulty due to movement of the head due to sedation which had been given for the MRI and High bifurcations especially on the left. - Right - 1% to 39% ICA stenosis. There is a slight elevation of velocity in the distal ICA probably secondary to tortuosity. Vertebral artery flow is antegrade. - Left - 1% to 39% ICA stenosis. Vertebral artery flow is antegrade.    Assessment/Plan:  1. Left pontine stroke  2. Residual right hemiparesis, gait disorder  3. Dyslipidemia  The patient has not been able to tolerate statin medications for her cholesterol, she may try red yeast  rice to see if this is helpful. The a she will remain on aspirin, and continue her rehabilitation. She will follow-up in 5 months.   Marlan Palau MD 12/07/2014 7:14 PM  [facility] [address] [place], [place] [address]  Phone [phone] Fax [phone]

## 2014-12-15 ENCOUNTER — Ambulatory Visit: Payer: Medicare PPO | Attending: Physical Medicine & Rehabilitation

## 2014-12-15 ENCOUNTER — Encounter: Payer: Self-pay | Admitting: Internal Medicine

## 2014-12-15 ENCOUNTER — Ambulatory Visit (INDEPENDENT_AMBULATORY_CARE_PROVIDER_SITE_OTHER): Payer: Medicare PPO | Admitting: Internal Medicine

## 2014-12-15 VITALS — BP 136/90 | HR 82 | Temp 98.2°F | Resp 20 | Ht 66.0 in | Wt 140.0 lb

## 2014-12-15 DIAGNOSIS — R262 Difficulty in walking, not elsewhere classified: Secondary | ICD-10-CM | POA: Diagnosis not present

## 2014-12-15 DIAGNOSIS — I10 Essential (primary) hypertension: Secondary | ICD-10-CM

## 2014-12-15 DIAGNOSIS — R279 Unspecified lack of coordination: Secondary | ICD-10-CM | POA: Insufficient documentation

## 2014-12-15 DIAGNOSIS — I639 Cerebral infarction, unspecified: Secondary | ICD-10-CM

## 2014-12-15 DIAGNOSIS — I6350 Cerebral infarction due to unspecified occlusion or stenosis of unspecified cerebral artery: Secondary | ICD-10-CM

## 2014-12-15 MED ORDER — ROSUVASTATIN CALCIUM 10 MG PO TABS: ORAL_TABLET | ORAL | Status: DC

## 2014-12-15 MED ORDER — LISINOPRIL 20 MG PO TABS: 20.0000 mg | ORAL_TABLET | Freq: Every day | ORAL | Status: DC

## 2014-12-15 NOTE — Patient Instructions (Addendum)
Fat and Cholesterol Control Diet Fat and cholesterol levels in your blood and organs are influenced by your diet. High levels of fat and cholesterol may lead to diseases of the heart, small and large blood vessels, gallbladder, liver, and pancreas. CONTROLLING FAT AND CHOLESTEROL WITH DIET Although exercise and lifestyle factors are important, your diet is key. That is because certain foods are known to raise cholesterol and others to lower it. The goal is to balance foods for their effect on cholesterol and more importantly, to replace saturated and trans fat with other types of fat, such as monounsaturated fat, polyunsaturated fat, and omega-3 fatty acids. On average, a person should consume no more than 15 to 17 g of saturated fat daily. Saturated and trans fats are considered "bad" fats, and they will raise LDL cholesterol. Saturated fats are primarily found in animal products such as meats, butter, and cream. However, that does not mean you need to give up all your favorite foods. Today, there are good tasting, low-fat, low-cholesterol substitutes for most of the things you like to eat. Choose low-fat or nonfat alternatives. Choose round or loin cuts of red meat. These types of cuts are lowest in fat and cholesterol. Chicken (without the skin), fish, veal, and ground turkey breast are great choices. Eliminate fatty meats, such as hot dogs and salami. Even shellfish have little or no saturated fat. Have a 3 oz (85 g) portion when you eat lean meat, poultry, or fish. Trans fats are also called "partially hydrogenated oils." They are oils that have been scientifically manipulated so that they are solid at room temperature resulting in a longer shelf life and improved taste and texture of foods in which they are added. Trans fats are found in stick margarine, some tub margarines, cookies, crackers, and baked goods.  When baking and cooking, oils are a great substitute for butter. The monounsaturated oils are  especially beneficial since it is believed they lower LDL and raise HDL. The oils you should avoid entirely are saturated tropical oils, such as coconut and palm.  Remember to eat a lot from food groups that are naturally free of saturated and trans fat, including fish, fruit, vegetables, beans, grains (barley, rice, couscous, bulgur wheat), and pasta (without cream sauces).  IDENTIFYING FOODS THAT LOWER FAT AND CHOLESTEROL  Soluble fiber may lower your cholesterol. This type of fiber is found in fruits such as apples, vegetables such as broccoli, potatoes, and carrots, legumes such as beans, peas, and lentils, and grains such as barley. Foods fortified with plant sterols (phytosterol) may also lower cholesterol. You should eat at least 2 g per day of these foods for a cholesterol lowering effect.  Read package labels to identify low-saturated fats, trans fat free, and low-fat foods at the supermarket. Select cheeses that have only 2 to 3 g saturated fat per ounce. Use a heart-healthy tub margarine that is free of trans fats or partially hydrogenated oil. When buying baked goods (cookies, crackers), avoid partially hydrogenated oils. Breads and muffins should be made from whole grains (whole-wheat or whole oat flour, instead of "flour" or "enriched flour"). Buy non-creamy canned soups with reduced salt and no added fats.  FOOD PREPARATION TECHNIQUES  Never deep-fry. If you must fry, either stir-fry, which uses very little fat, or use non-stick cooking sprays. When possible, broil, bake, or roast meats, and steam vegetables. Instead of putting butter or margarine on vegetables, use lemon and herbs, applesauce, and cinnamon (for squash and sweet potatoes). Use nonfat   yogurt, salsa, and low-fat dressings for salads.  LOW-SATURATED FAT / LOW-FAT FOOD SUBSTITUTES Meats / Saturated Fat (g)  Avoid: Steak, marbled (3 oz/85 g) / 11 g  Choose: Steak, lean (3 oz/85 g) / 4 g  Avoid: Hamburger (3 oz/85 g) / 7  g  Choose: Hamburger, lean (3 oz/85 g) / 5 g  Avoid: Ham (3 oz/85 g) / 6 g  Choose: Ham, lean cut (3 oz/85 g) / 2.4 g  Avoid: Chicken, with skin, dark meat (3 oz/85 g) / 4 g  Choose: Chicken, skin removed, dark meat (3 oz/85 g) / 2 g  Avoid: Chicken, with skin, light meat (3 oz/85 g) / 2.5 g  Choose: Chicken, skin removed, light meat (3 oz/85 g) / 1 g Dairy / Saturated Fat (g)  Avoid: Whole milk (1 cup) / 5 g  Choose: Low-fat milk, 2% (1 cup) / 3 g  Choose: Low-fat milk, 1% (1 cup) / 1.5 g  Choose: Skim milk (1 cup) / 0.3 g  Avoid: Hard cheese (1 oz/28 g) / 6 g  Choose: Skim milk cheese (1 oz/28 g) / 2 to 3 g  Avoid: Cottage cheese, 4% fat (1 cup) / 6.5 g  Choose: Low-fat cottage cheese, 1% fat (1 cup) / 1.5 g  Avoid: Ice cream (1 cup) / 9 g  Choose: Sherbet (1 cup) / 2.5 g  Choose: Nonfat frozen yogurt (1 cup) / 0.3 g  Choose: Frozen fruit bar / trace  Avoid: Whipped cream (1 tbs) / 3.5 g  Choose: Nondairy whipped topping (1 tbs) / 1 g Condiments / Saturated Fat (g)  Avoid: Mayonnaise (1 tbs) / 2 g  Choose: Low-fat mayonnaise (1 tbs) / 1 g  Avoid: Butter (1 tbs) / 7 g  Choose: Extra light margarine (1 tbs) / 1 g  Avoid: Coconut oil (1 tbs) / 11.8 g  Choose: Olive oil (1 tbs) / 1.8 g  Choose: Corn oil (1 tbs) / 1.7 g  Choose: Safflower oil (1 tbs) / 1.2 g  Choose: Sunflower oil (1 tbs) / 1.4 g  Choose: Soybean oil (1 tbs) / 2.4 g  Choose: Canola oil (1 tbs) / 1 g Document Released: 11/04/2005 Document Revised: 03/01/2013 Document Reviewed: 02/02/2014 [employer] Patient Information 2015 [employer]. This information is not intended to replace advice given to you by your health care provider. Make sure you discuss any questions you have with your health care provider.   Limit your sodium (Salt) intake  Please check your blood pressure on a regular basis.  If it is consistently greater than 150/90, please make an office appointment.  Return in  3 months for follow-up

## 2014-12-15 NOTE — Progress Notes (Signed)
Pre visit review using our clinic review tool, if applicable. No additional management support is needed unless otherwise documented below in the visit note. 

## 2014-12-15 NOTE — Progress Notes (Signed)
Subjective:    Patient ID: Teresa Martinez, female    DOB: 01/18/1941, 74 y.o.   MRN: [id number]  HPI  BP Readings from Last 3 Encounters:  12/15/14 136/90  12/07/14 148/81  12/05/14 [id number]    74 year old patient who has a history of essential hypertension, dyslipidemia, and statin intolerance who is seen in follow-up following a fairly recent left pontine stroke.  Her residual right hemiparesis continues to improve with active physical therapy.  Past Medical History  Diagnosis Date  . Hyperlipemia   . Statin intolerance   . Menopausal syndrome     Teresa Pen([facility] [place])  . Hypertension   . Overactive bladder   . Cervical spine degeneration     C5/C6  . Chronic right shoulder pain   . Hemiparesis affecting dominant side as late effect of cerebrovascular accident 12/07/2014    History   Social History  . Marital Status: Married    Spouse Name: N/A    Number of Children: 4  . Years of Education: N/A   Occupational History  . retired    Social History Main Topics  . Smoking status: Never Smoker   . Smokeless tobacco: Never Used  . Alcohol Use: No  . Drug Use: No  . Sexual Activity: Not on file   Other Topics Concern  . Not on file   Social History Narrative   Regular exercise, 4 children, 6 grandchildren, one stepchild and two additional stepgrandchildren   Patient is right handed.   Patient doesn't drink caffeine.    Past Surgical History  Procedure Laterality Date  . Cholecystectomy      1950  . Band hemorrhoidectomy      2010  . Parotidectomy      30 years ago  . Abdominal hysterectomy      1985    Family History  Problem Relation Age of Onset  . Heart failure Father   . Diabetes Mother   . Hypertension Mother   . Stroke Mother   . Cirrhosis Sister   . Hyperlipidemia Brother   . Hypertension Brother   . Stroke Brother   . Diabetes Brother     Allergies  Allergen Reactions  . Latex Other (See Comments)    Burns her skin  .  Pravastatin Other (See Comments)    Leg pains    Current Outpatient Prescriptions on File Prior to Visit  Medication Sig Dispense Refill  . aspirin EC 325 MG EC tablet Take 1 tablet (325 mg total) by mouth daily. 100 tablet 1  . fesoterodine (TOVIAZ) 4 MG TB24 tablet Take 1 tablet (4 mg total) by mouth daily. 30 tablet 1  . [DISCONTINUED] fesoterodine (TOVIAZ) 4 MG TB24 Take 1 tablet (4 mg total) by mouth daily. 30 tablet 3   No current facility-administered medications on file prior to visit.    BP 136/90 mmHg  Pulse 82  Temp(Src) 98.2 F (36.8 C) (Oral)  Resp 20  Ht 5\' 6"  (1.676 m)  Wt 140 lb (63.504 kg)  BMI 22.61 kg/m2  SpO2 96%     Review of Systems  HENT: Negative for congestion, dental problem, hearing loss, rhinorrhea, sinus pressure, sore throat and tinnitus.   Eyes: Negative for pain, discharge and visual disturbance.  Respiratory: Negative for cough and shortness of breath.   Cardiovascular: Negative for chest pain, palpitations and leg swelling.  Gastrointestinal: Negative for nausea, vomiting, abdominal pain, diarrhea, constipation, blood in stool and abdominal distention.  Genitourinary: Negative for dysuria, urgency,  frequency, hematuria, flank pain, vaginal bleeding, vaginal discharge, difficulty urinating, vaginal pain and pelvic pain.  Musculoskeletal: Positive for gait problem. Negative for joint swelling and arthralgias.  Skin: Negative for rash.  Neurological: Positive for weakness. Negative for dizziness, syncope, speech difficulty, numbness and headaches.  Hematological: Negative for adenopathy.  Psychiatric/Behavioral: Negative for behavioral problems, dysphoric mood and agitation. The patient is not nervous/anxious.        Objective:   Physical Exam  Constitutional: She is oriented to person, place, and time. She appears well-developed and well-nourished.  Blood pressure 160/90  HENT:  Head: Normocephalic.  Right Ear: External ear normal.  Left  Ear: External ear normal.  Mouth/Throat: Oropharynx is clear and moist.  Eyes: Conjunctivae and EOM are normal. Pupils are equal, round, and reactive to light.  Neck: Normal range of motion. Neck supple. No thyromegaly present.  Cardiovascular: Normal rate, regular rhythm, normal heart sounds and intact distal pulses.   Pulmonary/Chest: Effort normal and breath sounds normal.  Abdominal: Soft. Bowel sounds are normal. She exhibits no mass. There is no tenderness.  Musculoskeletal: Normal range of motion.  Lymphadenopathy:    She has no cervical adenopathy.  Neurological: She is alert and oriented to person, place, and time.  Right sided weakness  Skin: Skin is warm and dry. No rash noted.  Psychiatric: She has a normal mood and affect. Her behavior is normal.          Assessment & Plan:   Status post left pontine stroke with residual right-sided weakness Dyslipidemia/statin intolerance.  Patient wishes to try red yeast rice and a better diet.  Patient also agreeable to a trial of low-dose Crestor therapy  Hypertension.  Patient has been on combination therapy in the past.  Blood pressure seems to be trending up, will resume lisinopril only  Recheck 3 months

## 2014-12-16 DIAGNOSIS — R262 Difficulty in walking, not elsewhere classified: Secondary | ICD-10-CM | POA: Diagnosis not present

## 2014-12-16 NOTE — Therapy (Signed)
[facility] [address] [place], [place], [address] Phone: [phone]   Fax:  [phone]  Physical Therapy Evaluation  Patient Details  Name: Teresa Martinez MRN: [id number] Date of Birth: [birth year] Referring Provider:  Erick Colace, MD  Encounter Date: 12/15/2014      PT End of Session - 12/15/14 1203    Visit Number 1   Number of Visits 17   Date for PT Re-Evaluation 02/13/15   Authorization Type [employer] Medicare PPO--authorization required; G codes required   PT Start Time 1103   PT Stop Time 1145   PT Time Calculation (min) 42 min      Past Medical History  Diagnosis Date  . Hyperlipemia   . Statin intolerance   . Menopausal syndrome     [name])  . Hypertension   . Overactive bladder   . Cervical spine degeneration     C5/C6  . Chronic right shoulder pain   . Hemiparesis affecting dominant side as late effect of cerebrovascular accident 12/07/2014    Past Surgical History  Procedure Laterality Date  . Cholecystectomy      1950  . Band hemorrhoidectomy      2010  . Parotidectomy      30 years ago  . Abdominal hysterectomy      1985    There were no vitals taken for this visit.  Visit Diagnosis:  Difficulty walking  Lack of coordination      Subjective Assessment - 12/15/14 1111    Symptoms Pt is an avid runner. She had a left pontine CVA on 10/14/14 and was hospitalized, then had inpatient rehab, then home heath PT and OT. Pt reports she has difficulty with memory, strength of the right side, coordination and balance.   Pertinent History R rotator cuff tendonitis,    Patient Stated Goals to get back into running   Currently in Pain? Yes   Pain Score 7    Pain Location Shoulder   Pain Orientation Right   Aggravating Factors  primarily with shoulder flexion or abduction          [facility] PT Assessment - 12/16/14 0001    Assessment   Medical Diagnosis L  pontine CVA with R hemiparesis   Onset Date 10/14/14   Precautions   Precautions Fall   Balance Screen   Has the patient fallen in the past 6 months Yes   How many times? 1   Has the patient had a decrease in activity level because of a fear of falling?  No   Is the patient reluctant to leave their home because of a fear of falling?  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   Leisure running,    Cognition   Overall Cognitive Status Impaired/Different from baseline   Observation/Other Assessments   Focus on Therapeutic Outcomes (FOTO)  Functional Status 60   Other Surveys  Select  Stroke Impact Scale  Mobility 66.7%   Strength   Overall Strength Comments All lower extremity muscles on BLE test strong except R ankle inverters/everters and R hip flexors   Right Hip Flexion 4-/5   Right Hip ABduction 4-/5   Right Ankle Inversion 4-/5   Right Ankle Eversion 4-/5   Bed Mobility   Bed Mobility --  modified indpeendent   Transfers   Transfers Sit to Stand;Stand to Sit  Independent   Ambulation/Gait   Ambulation/Gait Yes   Ambulation/Gait Assistance 4: Min assist   Ambulation/Gait Assistance Details MIN A to steady occasionally, otherwise pt required supervision   Ambulation Distance (Feet) 200 Feet   Assistive device None  pt currently carries large based quad cane, doesn't use it   Gait Pattern Decreased step length - right;Decreased  dorsiflexion - right;Decreased hip/knee flexion - right  decreased arm swing, increased R adduction, ankle instabilit   Ambulation Surface Level;Indoor   Gait velocity 2.55 ft/sec  indicates limited community ambulator status   Stairs Yes   Stairs Assistance 5: Supervision   Stairs Assistance Details (indicate cue type and reason) with step-to pattern pt appears safe with rails   Number of Stairs 4   Functional Gait  Assessment   Gait assessed  Yes   Gait Level Surface Walks 20 ft in less than 7 sec but greater than 5.5 sec, uses assistive device, slower speed, mild gait deviations, or deviates 6-10 in outside of the 12 in walkway width.   Change in Gait Speed Able to change speed, demonstrates mild gait deviations, deviates 6-10 in outside of the 12 in walkway width, or no gait deviations, unable to achieve a major change in velocity, or uses a change in velocity, or uses an assistive device.   Gait with Horizontal Head Turns Performs head turns smoothly with slight change in gait velocity (eg, minor disruption to smooth gait path), deviates 6-10 in outside 12 in walkway width, or uses an assistive device.   Gait with Vertical Head Turns Performs task with moderate change in gait velocity, slows down, deviates 10-15 in outside 12 in walkway width but recovers, can continue to walk.   Gait and Pivot Turn Pivot turns safely within 3 sec and stops quickly with no loss of balance.   Step Over Obstacle Is able to step over one shoe box (4.5 in total height) without changing gait speed. No evidence of imbalance.   Gait with Narrow Base of Support Ambulates less than 4 steps heel to toe or cannot perform without assistance.   Gait with Eyes Closed Cannot walk 20 ft without assistance, severe gait deviations or imbalance, deviates greater than 15 in outside 12 in walkway width or will not attempt task.   Ambulating Backwards Walks 20 ft, no assistive devices, good speed, no evidence for imbalance, normal  gait   Steps Two feet to a stair, must use rail.   Total Score 16                            PT Short Term Goals - 12/15/14 1209    PT SHORT TERM GOAL #1   Title Pt will demonstrate correct performance of HEP to address muscle weakness, impaired balance and coordination. Target: 01/13/15   PT SHORT TERM GOAL #2   Title Pt will increase score on Functional Gait Assessment to 21/30 for improving balance. Target: 01/13/15   PT SHORT TERM GOAL #3  Title Pt will increase gait speed without assistive device to 2.8 ft/sec for community ambulator status. Target: 01/13/15   PT SHORT TERM GOAL #4   Title Pt will ambulate on even, indoor surfaces independently x500' without loss of balance. Target: 01/13/15           PT Long Term Goals - 12/16/14 0758    PT LONG TERM GOAL #1   Title Pt will verbalize understanding of CVA warning signs and risk factors. Target: 02/13/15   PT LONG TERM GOAL #2   Title Pt will increase score on Functional Gait Assessment to 25/30 for decreased fall risk. Target: 02/13/15   PT LONG TERM GOAL #3   Title Pt will negotiate stairs modified independent with single hand rail and step over step pattern for improved efficiency and safety with home access. Target: 02/13/15   PT LONG TERM GOAL #4   Title Pt will power walk on treadmill independently x10 minutes to demonstrate increased endurance and  progress towards preparing for return to sport. Target: 02/13/15   PT LONG TERM GOAL #5   Title Pt will ambulate on outdoor, uneven grass and pinstraw and negotiate curbs and ramps independently to demonstrate independent community ambulator status. Target: 02/13/15   Additional Long Term Goals   Additional Long Term Goals Yes   PT LONG TERM GOAL #6   Title Pt will increase stroke impact scale to 88% for improved function and participation in life post CVA. Target: 02/13/15               Plan - 12/15/14 1205    Clinical Impression Statement Pt is an  active 74 y/o female, avid runner that had a L pontine CVA on November 2015. She presents with gait and balance impairments, difficulty with negotiating stairs, compensations required for bed transfers, and weakness of the right nkale and hip. She wishes to return to running. Will benefit from skilled PT services to address these impairments.   Rehab Potential Good   PT Frequency 2x / week   PT Duration 8 weeks   PT Treatment/Interventions ADLs/Self Care Home Management;Therapeutic activities;Patient/family education;DME Instruction;Therapeutic exercise;Gait training;Balance training;Manual techniques;Stair training;Neuromuscular re-education   PT Next Visit Plan Ensure [employer] Authorization, Provide HEP to include tandem walking, heel walking, balance on compliant surfaces, isometric inversion and eversion of BLE in standing, hip strengthening          G-Codes - 12/16/14 0804    Functional Assessment Tool Used FGA 16/30   Functional Limitation Mobility: Walking and moving around   Mobility: Walking and Moving Around Current Status [phone](G8978) At least 40 percent but less than 60 percent impaired, limited or restricted   Mobility: Walking and Moving Around Goal Status [phone](G8979) At least 20 percent but less than 40 percent impaired, limited or restricted       Problem List Patient Active Problem List   Diagnosis Date Noted  . Hemiparesis affecting dominant side as late effect of cerebrovascular accident 12/07/2014  . Spastic neurogenic bladder 10/31/2014  . Right rotator cuff tendonitis 10/19/2014  . Stenosis of cervical spine region   . Chronic right shoulder pain   . Cerebral infarction due to thrombosis of left middle cerebral artery   . Left pontine stroke   . TIA (transient ischemic attack) 10/14/2014  . Hypokalemia 10/14/2014  . Overactive bladder 10/14/2014  . CVA (cerebral infarction) 10/14/2014  . URI 11/26/2010  . NECK PAIN, CHRONIC 10/29/2010  . Essential hypertension  10/10/2009  . INSOMNIA 09/15/2009  .  Pure hypercholesterolemia 11/28/2008  . MENOPAUSAL SYNDROME 11/28/2008    Lamar Laundry D 12/16/2014, 8:12 AM  [facility] [address] [place], [place], [address] Phone: [phone]   Fax:  [phone]

## 2014-12-23 ENCOUNTER — Ambulatory Visit: Payer: Medicare PPO | Admitting: Physical Therapy

## 2014-12-27 ENCOUNTER — Ambulatory Visit: Payer: Medicare PPO

## 2014-12-29 ENCOUNTER — Telehealth: Payer: Self-pay | Admitting: *Deleted

## 2014-12-29 ENCOUNTER — Ambulatory Visit: Payer: Medicare PPO | Attending: Physical Medicine & Rehabilitation

## 2014-12-29 DIAGNOSIS — I639 Cerebral infarction, unspecified: Secondary | ICD-10-CM

## 2014-12-29 DIAGNOSIS — I69359 Hemiplegia and hemiparesis following cerebral infarction affecting unspecified side: Secondary | ICD-10-CM

## 2014-12-29 DIAGNOSIS — R279 Unspecified lack of coordination: Secondary | ICD-10-CM | POA: Insufficient documentation

## 2014-12-29 DIAGNOSIS — R52 Pain, unspecified: Secondary | ICD-10-CM

## 2014-12-29 DIAGNOSIS — I6350 Cerebral infarction due to unspecified occlusion or stenosis of unspecified cerebral artery: Secondary | ICD-10-CM

## 2014-12-29 DIAGNOSIS — R262 Difficulty in walking, not elsewhere classified: Secondary | ICD-10-CM | POA: Insufficient documentation

## 2014-12-29 NOTE — Patient Instructions (Signed)
  FUNCTIONAL MOBILITY: Heel Walking   Walk forward on heels holding onto counter. When you get to the end of the counter, walk backward on heels. Keep back straight and chest out. Do 3 laps at counter per day..Copyright  VHI. All rights reserved.    Achilles / Gastroc, Standing   Stand, right foot behind, heel on floor, leg straight, forward leg bent. Hold wall and stick chest out.. Hold 30 seconds. Repeat 3 times per session. Do _2 __ sessions per day. Perform on each leg   Isometric Ankle strength: Stand facing the counter top and hold on as needed. Press the arch of your feet inward and hold 2 seconds, then press the outer edge of your feet outward and hold 2 seconds. Perform 30 repetitions daily.   Copyright  VHI. All rights reserved.    Bridging (Single Leg)   Lie on back with feet shoulder width apart and left leg straight and lifted off the bed. Press through the right leg and lift hips toward the ceiling while keeping left leg straight. Hold 1 seconds. Repeat 10 times. Do 3 sets per day.  http://gt2.exer.us/359   Copyright  VHI. All rights reserved.    Mini Squat: Single Leg   Hold onto counter. Stand on right foot, and do a mini squat. Keep knees in line with second toe. Knees do not go past toes. Stick bottom out and chest forward. Perform 3 sets of 10 daily. http://plyo.exer.us/72   Copyright  VHI. All rights reserved.    Abduction Lift   Lie on your side with a 1/4th forward roll and turn the upper leg inward. Lift the upper leg up towards the ceiling, keeping the knee straight and leg turned in, so that the heel is leading. Perform 3 sets of 10 daily. Do this on each leg.  Copyright  VHI. All rights reserved.    Feet Together (Compliant Surface) Head Motion - Eyes Closed   Stand in corner with chair in front of you on compliant surface: two stacked pillows with feet together. Close eyes and move head slowly, up and down 10x, horizontal 10x, diagonal  each direction 10x. Hold onto chair as needed for balance, eventually you will be able to let go of the chair. Copyright  VHI. All rights reserved.    Tandem Walking   Hold counter as needed. Walk heel to toe forwards. When you get to the counter, walk backwards in the same manner. Do 3 laps daily.   Copyright  VHI. All rights reserved.

## 2014-12-29 NOTE — Therapy (Signed)
[facility] [address] [place], [place], [address] Phone: [phone]   Fax:  [phone]  Physical Therapy Treatment  Patient Details  Name: Teresa SchaumannFrances H Mintz-Whitcomb MRN: [id number] Date of Birth: 05/16/1941 Referring Provider:  Gordy SaversKwiatkowski, Peter F, MD  Encounter Date: 12/29/2014      PT End of Session - 12/29/14 1142    Visit Number 2   Number of Visits 7   Date for PT Re-Evaluation 02/13/15   Authorization Type [employer] Medicare PPO--; G codes required   Authorization Time Period 2/5-3/21   Authorization - Visit Number 1   Authorization - Number of Visits 6   PT Start Time 1102   PT Stop Time 1145   PT Time Calculation (min) 43 min      Past Medical History  Diagnosis Date  . Hyperlipemia   . Statin intolerance   . Menopausal syndrome     [name](Sherry [place])  . Hypertension   . Overactive bladder   . Cervical spine degeneration     C5/C6  . Chronic right shoulder pain   . Hemiparesis affecting dominant side as late effect of cerebrovascular accident 12/07/2014    Past Surgical History  Procedure Laterality Date  . Cholecystectomy      1950  . Band hemorrhoidectomy      2010  . Parotidectomy      30 years ago  . Abdominal hysterectomy      1985    There were no vitals taken for this visit.  Visit Diagnosis:  Difficulty walking  Lack of coordination  Pain aggravated by changing postions     The patient was taught, performed, and was provided with a home exercise program to address balance and strengthening. See pt instructions for details.  Pt also performed bilateral isometric hip flexion for core strength 10x to form fatigue.                       PT Education - 12/29/14 1141    Education provided Yes   Education Details HEP see pt instructions   Person(s) Educated Patient   Methods Explanation;Demonstration;Handout;Tactile cues   Comprehension Verbalized  understanding;Returned demonstration          PT Short Term Goals - 12/15/14 1209    PT SHORT TERM GOAL #1   Title Pt will demonstrate correct performance of HEP to address muscle weakness, impaired balance and coordination. Target: 01/13/15   PT SHORT TERM GOAL #2   Title Pt will increase score on Functional Gait Assessment to 21/30 for improving balance. Target: 01/13/15   PT SHORT TERM GOAL #3   Title Pt will increase gait speed without assistive device to 2.8 ft/sec for community ambulator status. Target: 01/13/15   PT SHORT TERM GOAL #4   Title Pt will ambulate on even, indoor surfaces independently x500' without loss of balance. Target: 01/13/15           PT Long Term Goals - 12/16/14 0758    PT LONG TERM GOAL #1   Title Pt will verbalize understanding of CVA warning signs and risk factors. Target: 02/13/15   PT LONG TERM GOAL #2   Title Pt will increase score on Functional Gait Assessment to 25/30 for decreased fall risk. Target: 02/13/15   PT LONG TERM GOAL #3   Title Pt will negotiate stairs modified independent with single hand rail and step over step pattern for improved efficiency and safety with home access. Target:  02/13/15   PT LONG TERM GOAL #4   Title Pt will power walk on treadmill independently x10 minutes to demonstrate increased endurance and  progress towards preparing for return to sport. Target: 02/13/15   PT LONG TERM GOAL #5   Title Pt will ambulate on outdoor, uneven grass and pinstraw and negotiate curbs and ramps independently to demonstrate independent community ambulator status. Target: 02/13/15   Additional Long Term Goals   Additional Long Term Goals Yes   PT LONG TERM GOAL #6   Title Pt will increase stroke impact scale to 88% for improved function and participation in life post CVA. Target: 02/13/15               Plan - 12/29/14 1148    Clinical Impression Statement Pt is motivated for physical therapy. Was provided with HEP today. Expected to  make progress toward goals.   PT Next Visit Plan Review HEP to ensure correct performance. Ensure OT eval has been scheduled. Dynamic gait activities on compliant surfaces.        Problem List Patient Active Problem List   Diagnosis Date Noted  . Hemiparesis affecting dominant side as late effect of cerebrovascular accident 12/07/2014  . Spastic neurogenic bladder 10/31/2014  . Right rotator cuff tendonitis 10/19/2014  . Stenosis of cervical spine region   . Chronic right shoulder pain   . Cerebral infarction due to thrombosis of left middle cerebral artery   . Left pontine stroke   . TIA (transient ischemic attack) 10/14/2014  . Hypokalemia 10/14/2014  . Overactive bladder 10/14/2014  . CVA (cerebral infarction) 10/14/2014  . URI 11/26/2010  . NECK PAIN, CHRONIC 10/29/2010  . Essential hypertension 10/10/2009  . INSOMNIA 09/15/2009  . Pure hypercholesterolemia 11/28/2008  . MENOPAUSAL SYNDROME 11/28/2008   Lamar Laundry, PT,DPT,NCS 12/29/2014 11:55 AM Phone [phone] FAX [phone]         [facility] [address] [place], [place], [address] Phone: [phone]   Fax:  [phone]

## 2014-12-29 NOTE — Telephone Encounter (Signed)
Order placed

## 2014-12-29 NOTE — Telephone Encounter (Signed)
-----   Message from Erick ColaceAndrew E Kirsteins, MD sent at 12/29/2014 12:12 PM EST ----- Regarding: FW: OT referral needed Please place OT order for neuro rehabilitation  ----- Message -----    From: Mena PaulsJennifer D Alderson, PT    Sent: 12/29/2014  11:56 AM      To: Erick ColaceAndrew E Kirsteins, MD Subject: OT referral needed                             Dr. Wynn BankerKirsteins,  This patient would benefit from OT. Could you submit an order if you agree?   Thanks, Lamar LaundryJennifer Alderson, PT,DPT,NCS 12/29/2014 11:57 AM Phone [phone] FAX [phone]

## 2015-01-02 ENCOUNTER — Ambulatory Visit: Payer: Medicare PPO | Admitting: Physical Medicine & Rehabilitation

## 2015-01-02 ENCOUNTER — Ambulatory Visit: Payer: Medicare Other

## 2015-01-02 ENCOUNTER — Ambulatory Visit: Payer: Medicare PPO

## 2015-01-04 ENCOUNTER — Ambulatory Visit: Payer: Medicare PPO

## 2015-01-04 DIAGNOSIS — R52 Pain, unspecified: Secondary | ICD-10-CM

## 2015-01-04 DIAGNOSIS — R262 Difficulty in walking, not elsewhere classified: Secondary | ICD-10-CM | POA: Diagnosis not present

## 2015-01-04 DIAGNOSIS — R279 Unspecified lack of coordination: Secondary | ICD-10-CM

## 2015-01-04 NOTE — Therapy (Signed)
[facility] [address] [place], [place], [address] Phone: [phone]   Fax:  [phone]  Physical Therapy Treatment  Patient Details  Name: Teresa SchaumannFrances H Martinez MRN: [id number] Date of Birth: 08/30/1941 Referring Provider:  Gordy SaversKwiatkowski, Peter F, MD  Encounter Date: 01/04/2015      PT End of Session - 01/04/15 1209    Visit Number 3   Number of Visits 7   Date for PT Re-Evaluation 02/13/15   Authorization Type [employer] PPO--; G codes required   Authorization Time Period 2/5-3/21   Authorization - Visit Number 2   Authorization - Number of Visits 6   PT Start Time 1105   PT Stop Time 1150   PT Time Calculation (min) 45 min      Past Medical History  Diagnosis Date  . Hyperlipemia   . Statin intolerance   . Menopausal syndrome     [name](Sherry Leonard[place]stein)  . Hypertension   . Overactive bladder   . Cervical spine degeneration     C5/C6  . Chronic right shoulder pain   . Hemiparesis affecting dominant side as late effect of cerebrovascular accident 12/07/2014    Past Surgical History  Procedure Laterality Date  . Cholecystectomy      1950  . Band hemorrhoidectomy      2010  . Parotidectomy      30 years ago  . Abdominal hysterectomy      1985    There were no vitals taken for this visit.  Visit Diagnosis:  Lack of coordination  Difficulty walking  Pain aggravated by changing postions      Subjective Assessment - 01/04/15 1105    Symptoms Pt reports she missed her follow up with her doctor on Monday due to snow. Will see him next week.   Currently in Pain? No/denies     Neuro re-ed 5 laps on copliant mat high knee marching with MIN A with 2 second pauses forward 5 laps on compliant mat walking forward with emphasis on foot clearance 5 laps on compliant mat braiding with pt losing balance toward right; demonstrated improved performance after trials of RLE single limb stance with  intermittent UE support and RLE single limb stance with lateral weight shift with intermittent UE support and CGA 5 laps on compliant mat heel walking forward and backwards with poor/compensatory form; MIN A to correct Rockerboard for anterior/posterior limits of stability with intermittent UE support initially and MIN A, then with supervision without UE support  5 laps lateral walking on compliant balance beam without UE support 5 laps tandem walking initially with UE support, then without UE support with MIN A Star drill with 4 points in RLE single limb stance with LUE support, then without UE support with MIN A to steady.                           PT Short Term Goals - 12/15/14 1209    PT SHORT TERM GOAL #1   Title Pt will demonstrate correct performance of HEP to address muscle weakness, impaired balance and coordination. Target: 01/13/15   PT SHORT TERM GOAL #2   Title Pt will increase score on Functional Gait Assessment to 21/30 for improving balance. Target: 01/13/15   PT SHORT TERM GOAL #3   Title Pt will increase gait speed without assistive device to 2.8 ft/sec for community ambulator status. Target: 01/13/15   PT SHORT TERM GOAL #4  Title Pt will ambulate on even, indoor surfaces independently x500' without loss of balance. Target: 01/13/15           PT Long Term Goals - 12/16/14 0758    PT LONG TERM GOAL #1   Title Pt will verbalize understanding of CVA warning signs and risk factors. Target: 02/13/15   PT LONG TERM GOAL #2   Title Pt will increase score on Functional Gait Assessment to 25/30 for decreased fall risk. Target: 02/13/15   PT LONG TERM GOAL #3   Title Pt will negotiate stairs modified independent with single hand rail and step over step pattern for improved efficiency and safety with home access. Target: 02/13/15   PT LONG TERM GOAL #4   Title Pt will power walk on treadmill independently x10 minutes to demonstrate increased endurance and   progress towards preparing for return to sport. Target: 02/13/15   PT LONG TERM GOAL #5   Title Pt will ambulate on outdoor, uneven grass and pinstraw and negotiate curbs and ramps independently to demonstrate independent community ambulator status. Target: 02/13/15   Additional Long Term Goals   Additional Long Term Goals Yes   PT LONG TERM GOAL #6   Title Pt will increase stroke impact scale to 88% for improved function and participation in life post CVA. Target: 02/13/15               Plan - 01/04/15 1211    Clinical Impression Statement Pt demonstrates good response of her balance system to training. Improved timing of balance reactions with training today.   PT Next Visit Plan Continue with dynamic balance activities. Stair and curb training.        Problem List Patient Active Problem List   Diagnosis Date Noted  . Hemiparesis affecting dominant side as late effect of cerebrovascular accident 12/07/2014  . Spastic neurogenic bladder 10/31/2014  . Right rotator cuff tendonitis 10/19/2014  . Stenosis of cervical spine region   . Chronic right shoulder pain   . Cerebral infarction due to thrombosis of left middle cerebral artery   . Left pontine stroke   . TIA (transient ischemic attack) 10/14/2014  . Hypokalemia 10/14/2014  . Overactive bladder 10/14/2014  . CVA (cerebral infarction) 10/14/2014  . URI 11/26/2010  . NECK PAIN, CHRONIC 10/29/2010  . Essential hypertension 10/10/2009  . INSOMNIA 09/15/2009  . Pure hypercholesterolemia 11/28/2008  . MENOPAUSAL SYNDROME 11/28/2008    Lamar Laundry D 01/04/2015, 12:14 PM  [facility] [address] [place], [place], [address] Phone: [phone]   Fax:  [phone]

## 2015-01-06 ENCOUNTER — Encounter: Payer: Self-pay | Admitting: Occupational Therapy

## 2015-01-06 ENCOUNTER — Ambulatory Visit: Payer: Medicare PPO | Admitting: Occupational Therapy

## 2015-01-06 DIAGNOSIS — M6281 Muscle weakness (generalized): Secondary | ICD-10-CM

## 2015-01-06 DIAGNOSIS — G8191 Hemiplegia, unspecified affecting right dominant side: Secondary | ICD-10-CM

## 2015-01-06 DIAGNOSIS — IMO0002 Reserved for concepts with insufficient information to code with codable children: Secondary | ICD-10-CM

## 2015-01-06 DIAGNOSIS — M25511 Pain in right shoulder: Secondary | ICD-10-CM

## 2015-01-06 DIAGNOSIS — R262 Difficulty in walking, not elsewhere classified: Secondary | ICD-10-CM | POA: Diagnosis not present

## 2015-01-06 NOTE — Therapy (Signed)
[facility] [address] [place], [place], [address] Phone: [phone]   Fax:  [phone]  Occupational Therapy Evaluation  Patient Details  Name: Teresa Martinez MRN: [id number] Date of Birth: [birth year] Referring Provider:  Gordy Savers, MD  Encounter Date: 01/06/2015      OT End of Session - 01/06/15 1206    Visit Number 1   Number of Visits 16   Date for OT Re-Evaluation 03/03/15   OT Start Time 1016   OT Stop Time 1100   OT Time Calculation (min) 44 min   Activity Tolerance Patient tolerated treatment well      Past Medical History  Diagnosis Date  . Hyperlipemia   . Statin intolerance   . Menopausal syndrome     [name])  . Hypertension   . Overactive bladder   . Cervical spine degeneration     C5/C6  . Chronic right shoulder pain   . Hemiparesis affecting dominant side as late effect of cerebrovascular accident 12/07/2014    Past Surgical History  Procedure Laterality Date  . Cholecystectomy      1950  . Band hemorrhoidectomy      2010  . Parotidectomy      30 years ago  . Abdominal hysterectomy      1985    There were no vitals taken for this visit.  Visit Diagnosis:  Hemiplegia affecting right dominant side - Plan: Ot plan of care cert/re-cert  Generalized muscle weakness - Plan: Ot plan of care cert/re-cert  Lack of coordination due to stroke - Plan: Ot plan of care cert/re-cert  Pain in joint, shoulder region, right - Plan: Ot plan of care cert/re-cert      Subjective Assessment - 01/06/15 1021    Symptoms I want to strengthen my right arm and hand   Pertinent History see epic snapshot   Pain Score 5    Pain Location Shoulder  pt had minor pain prior to stroke however much more significant now   Pain Orientation Right   Aggravating Factors  Raising my arm, sleeping on it;  being still and heating pad help.  Sharp pain, intermitent, more than a month    Multiple Pain Sites No          [facility] OT Assessment - 01/06/15 0001    Assessment   Diagnosis L pontine CVA   Onset Date 10/15/15   Prior Therapy inpt rehab and [facility] PT and OT.     Precautions   Precautions None   Restrictions   Weight Bearing Restrictions No   Balance Screen   Has the patient fallen in the past 6 months No   Home  Environment   Family/patient expects to be discharged to: Private residence   Living Arrangements Spouse/significant other  daughter at night and occassionally grandaughters   Type of Home House   Home Access Stairs  5 with railing on right as you ascend   Home Layout One level   Bathroom Copywriter, advertising  built in seat but pt not using it   Additional Comments grab bars in shower    Prior Function   Level of Independence Independent with basic ADLs;Independent with homemaking with ambulation   Vocation Part time employment   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   ADL   Eating/Feeding Minimal assistance  cutting;  using L hand to eat with   Grooming Modified independent  using L  hand   Upper Body Bathing Independent   Lower Body Bathing Independent   Upper Body Dressing Independent  mod I and mainly using L hand   Lower Body Dressing Modified independent   Toilet Tranfer Independent   Toileting - Clothing Manipulation Independent   Toileting -  Hygiene Independent   Tub/Shower Transfer Modified independent  uses grab bars   IADL   Shopping Needs to be accompanied on any shopping trip  supervison for ambulation   Light Housekeeping Performs light daily tasks but cannot maintain acceptable level of cleanliness   Meal Prep Able to complete simple cold meal and snack prep  with set up prior to meal prep   Community Mobility Relies on family or friends for transportation   Medication Management Is responsible for taking medication in correct dosages at correct time   Physicist, medicalinancial Management  Manages financial matters independently (budgets, writes checks, pays rent, bills goes to bank), collects and keeps track of income  on computer - takes additional time due to R hand   Mobility   Mobility Status Independent   Written Expression   Dominant Hand Right   Handwriting 50% legible  legible for name, short phrases. only   Vision - History   Baseline Vision Wears glasses for distance only  reading occassionally   Vision Assessment   Comment WL's - pt does not report any changes   Activity Tolerance   Activity Tolerance Tolerates 10-20 min activity with muiltiple rests   Cognition   Overall Cognitive Status Within Functional Limits for tasks assessed  will further assess via functional tasks   Sensation   Light Touch Appears Intact   Hot/Cold Appears Intact   Proprioception Appears Intact   Coordination   Gross Motor Movements are Fluid and Coordinated No   Fine Motor Movements are Fluid and Coordinated No   9 Hole Peg Test Right   Box and Blocks R = 40   Other 9 hole peg R= 37.97   AROM   Overall AROM  Deficits   Overall AROM Comments Shoulder flexion to 90 degree/abduction to 90 degrees due to pain (Possibe rotator cuff tendinitis per chart exacerbated by stroke).  - 15 degrees supination; slightly limited finger extension with wrist neutral.   Strength   Overall Strength Comments 3+/5 for shoulder flexion and abduction; elbow to hand WFL's   Hand Function   Right Hand Gross Grasp Impaired   Right Hand Grip (lbs) 20 pounds   Left Hand Gross Grasp Functional   Left Hand Grip (lbs) 72 pounds                         OT Short Term Goals - 01/06/15 1217    OT SHORT TERM GOAL #1   Title Pt will be mod I with HEP - 02/03/2015   Status New   OT SHORT TERM GOAL #2   Title Pt will  demonstrate improved grip strength R hand by at least 5 pounds to assist in functional tasks (baseline 20 pounds)   Status New   OT SHORT TERM GOAL #3   Title Pt will  demonstrate improved coordination as evidenced by reducing time on 9 hole peg by 4 seconds (baseline 37.97)   Status New   OT SHORT TERM GOAL #4   Title Pt will be mod I with eating with R hand with AE prn   Status New  OT Long Term Goals - Jan 26, 2015 1224    OT LONG TERM GOAL #1   Title Pt will be mod I with upgraded HEP - 03/03/2015   Status New   OT LONG TERM GOAL #2   Title Pt will demonstrate increased grip strength by 8 pounds to assist with functional tasks (baseline - 20 pounds)   Status New   OT LONG TERM GOAL #3   Title Pt will demonstrate improved coordination as evidenced by decreasing 9 hole peg time by 7 seconds (baseline = 37.97)   Status New   OT LONG TERM GOAL #4   Title Pt will be able to write a 3 sentence paragraph level (AE prn) legibly   Status New   OT LONG TERM GOAL #5   Title Pt will be independent with grocery shopping   Status New   Long Term Additional Goals   Additional Long Term Goals Yes   OT LONG TERM GOAL #6   Title Pt will independent with chopping and preparing food for full meal prep using R hand.   Status New   OT LONG TERM GOAL #7   Title Pt will demonstrate ability to use RUE as dominant for basic ADL tasks 100% of the time   Status New   OT LONG TERM GOAL #8   Title Pt will use RUE as dominant at least 50% of the time for IADL tasks   Status New               Plan - Jan 26, 2015 1211    Clinical Impression Statement Pt is a 74 year old female s/p L pontine CVA on 10/15/2015. Pt had inpatient rehab and [facility] therapies and now presents today at the outpatient center with the following impairments that impact her ability to complete ADL's and I ADL' s as well as return to work:  R hemplegia impacting dominant side, decreased strength, decreased coordination, pain in R shoulder, decreased activity tolerance.   Pt will benefit from skilled therapeutic intervention in order to improve on the following deficits (Retired) Decreased  activity tolerance;Decreased coordination;Decreased range of motion;Decreased strength;Impaired UE functional use;Other (comment)  pain in R shoulder   Rehab Potential Good   OT Frequency 2x / week   OT Duration 8 weeks   OT Treatment/Interventions Self-care/ADL training;Moist Heat;DME and/or AE instruction;Energy conservation;Neuromuscular education;Therapeutic exercise;Passive range of motion;Therapeutic activities;Patient/family education   Plan intiate HEP   Consulted and Agree with Plan of Care Patient;Family member/caregiver   Family Member Consulted daughter          G-Codes - Jan 26, 2015 1230    Functional Limitation Carrying, moving and handling objects   Carrying, Moving and Handling Objects Current Status [phone]) At least 80 percent but less than 100 percent impaired, limited or restricted   Carrying, Moving and Handling Objects Goal Status (U0454) At least 40 percent but less than 60 percent impaired, limited or restricted      Problem List Patient Active Problem List   Diagnosis Date Noted  . Hemiparesis affecting dominant side as late effect of cerebrovascular accident 12/07/2014  . Spastic neurogenic bladder 10/31/2014  . Right rotator cuff tendonitis 10/19/2014  . Stenosis of cervical spine region   . Chronic right shoulder pain   . Cerebral infarction due to thrombosis of left middle cerebral artery   . Left pontine stroke   . TIA (transient ischemic attack) 10/14/2014  . Hypokalemia 10/14/2014  . Overactive bladder 10/14/2014  . CVA (cerebral infarction) 10/14/2014  .  URI 11/26/2010  . NECK PAIN, CHRONIC 10/29/2010  . Essential hypertension 10/10/2009  . INSOMNIA 09/15/2009  . Pure hypercholesterolemia 11/28/2008  . MENOPAUSAL SYNDROME 11/28/2008    Norton Pastel, OTR/L 01/06/2015, 12:32 PM  [facility] [address] [place], [place], [address] Phone: [phone]   Fax:   [phone]

## 2015-01-09 ENCOUNTER — Encounter: Payer: Self-pay | Admitting: Physical Medicine & Rehabilitation

## 2015-01-09 ENCOUNTER — Ambulatory Visit: Payer: Medicare PPO | Admitting: Physical Therapy

## 2015-01-09 ENCOUNTER — Encounter: Payer: Medicare PPO | Attending: Physical Medicine & Rehabilitation

## 2015-01-09 ENCOUNTER — Ambulatory Visit (HOSPITAL_BASED_OUTPATIENT_CLINIC_OR_DEPARTMENT_OTHER): Payer: Medicare PPO | Admitting: Physical Medicine & Rehabilitation

## 2015-01-09 ENCOUNTER — Encounter: Payer: Self-pay | Admitting: Physical Therapy

## 2015-01-09 VITALS — BP 142/82 | HR 80 | Resp 14

## 2015-01-09 DIAGNOSIS — I69359 Hemiplegia and hemiparesis following cerebral infarction affecting unspecified side: Secondary | ICD-10-CM

## 2015-01-09 DIAGNOSIS — R279 Unspecified lack of coordination: Secondary | ICD-10-CM | POA: Insufficient documentation

## 2015-01-09 DIAGNOSIS — I6350 Cerebral infarction due to unspecified occlusion or stenosis of unspecified cerebral artery: Secondary | ICD-10-CM

## 2015-01-09 DIAGNOSIS — R262 Difficulty in walking, not elsewhere classified: Secondary | ICD-10-CM | POA: Diagnosis not present

## 2015-01-09 DIAGNOSIS — G8111 Spastic hemiplegia affecting right dominant side: Secondary | ICD-10-CM | POA: Insufficient documentation

## 2015-01-09 DIAGNOSIS — I69922 Dysarthria following unspecified cerebrovascular disease: Secondary | ICD-10-CM

## 2015-01-09 DIAGNOSIS — I639 Cerebral infarction, unspecified: Secondary | ICD-10-CM

## 2015-01-09 DIAGNOSIS — IMO0002 Reserved for concepts with insufficient information to code with codable children: Secondary | ICD-10-CM | POA: Insufficient documentation

## 2015-01-09 NOTE — Therapy (Signed)
[facility] [address] [place], [place], [address] Phone: [phone]   Fax:  [phone]  Physical Therapy Treatment  Patient Details  Name: Teresa Martinez MRN: [id number] Date of Birth: 04-Mar-1941 Referring Provider:  Marletta Lor, MD  Encounter Date: 01/09/2015      PT End of Session - 01/09/15 1022    Visit Number 4   Number of Visits 7   Date for PT Re-Evaluation 02/13/15   Authorization Type [employer] PPO--; G codes required   Authorization Time Period 2/5-3/21   Authorization - Visit Number 3   Authorization - Number of Visits 6   PT Start Time 4034   PT Stop Time 1056   PT Time Calculation (min) 38 min   Equipment Utilized During Treatment Gait belt   Activity Tolerance Patient tolerated treatment well   Behavior During Therapy [facility] for tasks assessed/performed      Past Medical History  Diagnosis Date  . Hyperlipemia   . Statin intolerance   . Menopausal syndrome     [name])  . Hypertension   . Overactive bladder   . Cervical spine degeneration     C5/C6  . Chronic right shoulder pain   . Hemiparesis affecting dominant side as late effect of cerebrovascular accident 12/07/2014    Past Surgical History  Procedure Laterality Date  . Cholecystectomy      1950  . Band hemorrhoidectomy      2010  . Parotidectomy      30 years ago  . Abdominal hysterectomy      1985    There were no vitals taken for this visit.  Visit Diagnosis:  Difficulty walking  Lack of coordination      Subjective Assessment - 01/09/15 1021    Symptoms Sees MD today at 2:30. No new complaints.    Currently in Pain? Yes   Pain Score 5    Pain Location Shoulder   Pain Orientation Right   Pain Descriptors / Indicators Aching;Sharp   Pain Type Chronic pain   Pain Onset More than a month ago   Pain Frequency Intermittent   Aggravating Factors  raising her arm above shoulder  level   Pain Relieving Factors rest, not raising her arm          [facility] PT Assessment - 01/09/15 1029    Functional Gait  Assessment   Gait assessed  Yes   Gait Level Surface Walks 20 ft in less than 5.5 sec, no assistive devices, good speed, no evidence for imbalance, normal gait pattern, deviates no more than 6 in outside of the 12 in walkway width.   Change in Gait Speed Able to smoothly change walking speed without loss of balance or gait deviation. Deviate no more than 6 in outside of the 12 in walkway width.   Gait with Horizontal Head Turns Performs head turns smoothly with slight change in gait velocity (eg, minor disruption to smooth gait path), deviates 6-10 in outside 12 in walkway width, or uses an assistive device.   Gait with Vertical Head Turns Performs task with slight change in gait velocity (eg, minor disruption to smooth gait path), deviates 6 - 10 in outside 12 in walkway width or uses assistive device   Gait and Pivot Turn Pivot turns safely within 3 sec and stops quickly with no loss of balance.   Step Over Obstacle Is able to step over one shoe box (4.5 in total height) without  changing gait speed. No evidence of imbalance.   Gait with Narrow Base of Support Ambulates 7-9 steps.   Gait with Eyes Closed Cannot walk 20 ft without assistance, severe gait deviations or imbalance, deviates greater than 15 in outside 12 in walkway width or will not attempt task.   Ambulating Backwards Walks 20 ft, slow speed, abnormal gait pattern, evidence for imbalance, deviates 10-15 in outside 12 in walkway width.   Steps Alternating feet, must use rail.   Total Score 20           [facility] Adult PT Treatment/Exercise - 01/09/15 1029    Ambulation/Gait   Ambulation/Gait Yes   Ambulation/Gait Assistance 5: Supervision   Ambulation/Gait Assistance Details cues on posture, for increased step/stride length and reciprocal arm swing   Ambulation Distance (Feet) 550 Feet   Assistive device  None   Gait Pattern Decreased step length - right;Decreased dorsiflexion - right;Decreased hip/knee flexion - right   Ambulation Surface Level;Indoor   Gait velocity 12.06 sec = 2.72 ft/sec without AD   Stairs Yes   Stairs Assistance 5: Supervision   Stair Management Technique Two rails;Alternating pattern;Forwards   Number of Stairs 4   Dynamic Standing Balance   Dynamic Standing - Balance Support No upper extremity supported;During functional activity   Dynamic Standing - Level of Assistance 4: Min assist;5: Stand by assistance   Dynamic Standing - Balance Activities Head turns;Head nods;Eyes closed;Foam balance beam;Rocker board   Dynamic Standing - Comments on rocker board: hold steady with eyes closes, hold steady with eyes closes and head nods/shakes. No UE support with up to min assist needed. standing with feet across blue foam beam: alternating forward heel taps and backward toe taps x 10 each bil legs with minimal UE support on bars and up to min assist for balance.           PT Short Term Goals - 01/09/15 1612    PT SHORT TERM GOAL #1   Title Pt will demonstrate correct performance of HEP to address muscle weakness, impaired balance and coordination. Target: 01/13/15   Status Achieved   PT SHORT TERM GOAL #2   Title Pt will increase score on Functional Gait Assessment to 21/30 for improving balance. Target: 01/13/15   Baseline on 2/22- 20/30   Status Not Met   PT SHORT TERM GOAL #3   Title Pt will increase gait speed without assistive device to 2.8 ft/sec for community ambulator status. Target: 01/13/15   Baseline on 2/22- 2.72 ft/sec without AD   Status Not Met   PT SHORT TERM GOAL #4   Title Pt will ambulate on even, indoor surfaces independently x500' without loss of balance. Target: 01/13/15   Status Achieved           PT Long Term Goals - 12/16/14 0758    PT LONG TERM GOAL #1   Title Pt will verbalize understanding of CVA warning signs and risk factors. Target:  02/13/15   PT LONG TERM GOAL #2   Title Pt will increase score on Functional Gait Assessment to 25/30 for decreased fall risk. Target: 02/13/15   PT LONG TERM GOAL #3   Title Pt will negotiate stairs modified independent with single hand rail and step over step pattern for improved efficiency and safety with home access. Target: 02/13/15   PT LONG TERM GOAL #4   Title Pt will power walk on treadmill independently x10 minutes to demonstrate increased endurance and  progress towards preparing for return  to sport. Target: 02/13/15   PT LONG TERM GOAL #5   Title Pt will ambulate on outdoor, uneven grass and pinstraw and negotiate curbs and ramps independently to demonstrate independent community ambulator status. Target: 02/13/15   Additional Long Term Goals   Additional Long Term Goals Yes   PT LONG TERM GOAL #6   Title Pt will increase stroke impact scale to 88% for improved function and participation in life post CVA. Target: 02/13/15           Plan - 01/09/15 1611    Clinical Impression Statement Pt met 2/4 STG's today and is progressing toward her other goals.    Rehab Potential Good   PT Frequency 2x / week   PT Duration 8 weeks   PT Treatment/Interventions ADLs/Self Care Home Management;Therapeutic activities;Patient/family education;DME Instruction;Therapeutic exercise;Gait training;Balance training;Manual techniques;Stair training;Neuromuscular re-education   PT Next Visit Plan Continue with gait and balance activities.       Problem List Patient Active Problem List   Diagnosis Date Noted  . Hemiparesis affecting dominant side as late effect of cerebrovascular accident 12/07/2014  . Spastic neurogenic bladder 10/31/2014  . Right rotator cuff tendonitis 10/19/2014  . Stenosis of cervical spine region   . Chronic right shoulder pain   . Cerebral infarction due to thrombosis of left middle cerebral artery   . Left pontine stroke   . TIA (transient ischemic attack) 10/14/2014  .  Hypokalemia 10/14/2014  . Overactive bladder 10/14/2014  . CVA (cerebral infarction) 10/14/2014  . URI 11/26/2010  . NECK PAIN, CHRONIC 10/29/2010  . Essential hypertension 10/10/2009  . INSOMNIA 09/15/2009  . Pure hypercholesterolemia 11/28/2008  . MENOPAUSAL SYNDROME 11/28/2008    Willow Ora 01/09/2015, 4:13 PM  Willow Ora, PTA, [facility] [address], [place], [place] [address] [phone] 01/09/2015, 4:14 PM

## 2015-01-09 NOTE — Patient Instructions (Signed)
Continue reading out loud

## 2015-01-09 NOTE — Progress Notes (Signed)
Subjective:    Patient ID: Teresa Martinez, female    DOB: 1941/03/18, 74 y.o.   MRN: [id number]  HPI Bathing and dressingIndependently Ambulating without assisted device that has a slow gait pattern and needs to hold onto walls when she turns  Pain Inventory Average Pain 5 Pain Right Now 0 My pain is sharp  In the last 24 hours, has pain interfered with the following? General activity 4 Relation with others 0 Enjoyment of life 0 What TIME of day is your pain at its worst? with movement Sleep (in general) Fair  Pain is worse with: some activites and pain spikes when max ROM is reached Pain improves with: therapy/exercise Relief from Meds: no meds  Mobility walk without assistance ability to climb steps?  yes do you drive?  no Do you have any goals in this area?  yes  Function retired I need assistance with the following:  meal prep, household duties and shopping  Neuro/Psych bladder control problems weakness tingling trouble walking  Prior Studies Any changes since last visit?  no  Physicians involved in your care Any changes since last visit?  no   Family History  Problem Relation Age of Onset  . Heart failure Father   . Diabetes Mother   . Hypertension Mother   . Stroke Mother   . Cirrhosis Sister   . Hyperlipidemia Brother   . Hypertension Brother   . Stroke Brother   . Diabetes Brother    History   Social History  . Marital Status: Married    Spouse Name: N/A  . Number of Children: 4  . Years of Education: N/A   Occupational History  . retired    Social History Main Topics  . Smoking status: Never Smoker   . Smokeless tobacco: Never Used  . Alcohol Use: No  . Drug Use: No  . Sexual Activity: Not on file   Other Topics Concern  . None   Social History Narrative   Regular exercise, 4 children, 6 grandchildren, one stepchild and two additional stepgrandchildren   Patient is right handed.   Patient doesn't drink caffeine.    Past Surgical History  Procedure Laterality Date  . Cholecystectomy      1950  . Band hemorrhoidectomy      2010  . Parotidectomy      30 years ago  . Abdominal hysterectomy      1985   Past Medical History  Diagnosis Date  . Hyperlipemia   . Statin intolerance   . Menopausal syndrome     [name] [place])  . Hypertension   . Overactive bladder   . Cervical spine degeneration     C5/C6  . Chronic right shoulder pain   . Hemiparesis affecting dominant side as late effect of cerebrovascular accident 12/07/2014   BP 142/82 mmHg  Pulse 80  Resp 14  SpO2 95%  Opioid Risk Score:   Fall Risk Score: Low Fall Risk (0-5 points)  Review of Systems  Genitourinary:       Bladder control problems  Musculoskeletal: Positive for gait problem.  Neurological: Positive for weakness.       Tingling        Objective:   Physical Exam  Constitutional: She is oriented to person, place, and time. She appears well-developed and well-nourished.  HENT:  Head: Normocephalic and atraumatic.  Eyes: Pupils are equal, round, and reactive to light.  Neurological: She is alert and oriented to person, place, and time. No  sensory deficit.  Motor strength is 3+ right deltoid biceps and triceps and grip 4 at the right hip flexor knee extensor and ankle dorsiflexor 5/5 in the left upper and the left lower extremity Speech is with mild to moderate dysarthria but also has some anomia  Psychiatric: She has a normal mood and affect.  Nursing note and vitals reviewed.  Ambulates with stifflegged gait wide base of support       Assessment & Plan:  1 Left pontine infarct with right hemiparesis Recommend outpatient PT and OT    2.mild to moderate dysarthria but also has some anomia Recommend outpatient speech therapy  3. Hemiplegic shoulder pain right side, outpatient OT, injection in 1 month if not much better

## 2015-01-11 ENCOUNTER — Ambulatory Visit: Payer: Medicare PPO | Admitting: Physical Therapy

## 2015-01-11 ENCOUNTER — Encounter: Payer: Self-pay | Admitting: Physical Therapy

## 2015-01-11 DIAGNOSIS — R262 Difficulty in walking, not elsewhere classified: Secondary | ICD-10-CM

## 2015-01-11 DIAGNOSIS — R279 Unspecified lack of coordination: Secondary | ICD-10-CM

## 2015-01-11 NOTE — Therapy (Signed)
[facility] [address] [place], [place], [address] Phone: [phone]   Fax:  [phone]  Physical Therapy Treatment  Patient Details  Name: Teresa Martinez MRN: [id number] Date of Birth: [birth year] Referring Provider:  Marletta Lor, MD  Encounter Date: 01/11/2015      PT End of Session - 01/11/15 1307    Visit Number 5   Number of Visits 7   Date for PT Re-Evaluation 02/13/15   Authorization Type [employer] PPO--; G codes required   Authorization Time Period 2/5-3/21   Authorization - Visit Number 4   Authorization - Number of Visits 6   PT Start Time 1017   PT Stop Time 1103   PT Time Calculation (min) 46 min   Equipment Utilized During Treatment Gait belt   Activity Tolerance Patient tolerated treatment well   Behavior During Therapy [facility] for tasks assessed/performed      Past Medical History  Diagnosis Date  . Hyperlipemia   . Statin intolerance   . Menopausal syndrome     [name] [place])  . Hypertension   . Overactive bladder   . Cervical spine degeneration     C5/C6  . Chronic right shoulder pain   . Hemiparesis affecting dominant side as late effect of cerebrovascular accident 12/07/2014    Past Surgical History  Procedure Laterality Date  . Cholecystectomy      1950  . Band hemorrhoidectomy      2010  . Parotidectomy      30 years ago  . Abdominal hysterectomy      1985    There were no vitals taken for this visit.  Visit Diagnosis:  Difficulty walking  Lack of coordination      Subjective Assessment - 01/11/15 1021    Symptoms Tired after last session. No pain or falls to report. (does have shoulder pain when raising her arm, OT addressing)   Currently in Pain? No/denies   Pain Score 0-No pain     Treatment: Gait - treadmill 1.4 to 1.5 mph x 5 minutes with bil UE support and cues on posture and to clear feet with swing phase.  - 430 feet on level  indoor floors with supervision and cues on step/stride length and heel strike to decrease shuffle steps. Cues to increase stride length as well.  Neuro Re-ed: - inverted BOSU in parallel bars: rocks anterior/posterior and lateral x 10 each way; mini squats x 10 reps with minimal to no UE support.  - red mats with 6 cones in a row at edge: alternating forward toe taps to each with side stepping x 2 laps each way; alternating double toe taps to each cone with side stepping x 2 laps each way; alternating flip over/up with side stepping x 2 laps each way.          PT Short Term Goals - 01/09/15 1612    PT SHORT TERM GOAL #1   Title Pt will demonstrate correct performance of HEP to address muscle weakness, impaired balance and coordination. Target: 01/13/15   Status Achieved   PT SHORT TERM GOAL #2   Title Pt will increase score on Functional Gait Assessment to 21/30 for improving balance. Target: 01/13/15   Baseline on 2/22- 20/30   Status Not Met   PT SHORT TERM GOAL #3   Title Pt will increase gait speed without assistive device to 2.8 ft/sec for community ambulator status. Target: 01/13/15   Baseline on 2/22- 2.72  ft/sec without AD   Status Not Met   PT SHORT TERM GOAL #4   Title Pt will ambulate on even, indoor surfaces independently x500' without loss of balance. Target: 01/13/15   Status Achieved           PT Long Term Goals - 12/16/14 0758    PT LONG TERM GOAL #1   Title Pt will verbalize understanding of CVA warning signs and risk factors. Target: 02/13/15   PT LONG TERM GOAL #2   Title Pt will increase score on Functional Gait Assessment to 25/30 for decreased fall risk. Target: 02/13/15   PT LONG TERM GOAL #3   Title Pt will negotiate stairs modified independent with single hand rail and step over step pattern for improved efficiency and safety with home access. Target: 02/13/15   PT LONG TERM GOAL #4   Title Pt will power walk on treadmill independently x10 minutes to  demonstrate increased endurance and  progress towards preparing for return to sport. Target: 02/13/15   PT LONG TERM GOAL #5   Title Pt will ambulate on outdoor, uneven grass and pinstraw and negotiate curbs and ramps independently to demonstrate independent community ambulator status. Target: 02/13/15   Additional Long Term Goals   Additional Long Term Goals Yes   PT LONG TERM GOAL #6   Title Pt will increase stroke impact scale to 88% for improved function and participation in life post CVA. Target: 02/13/15           Plan - 01/11/15 1308    Clinical Impression Statement Pt making steady progress toward her goals.   Rehab Potential Good   PT Frequency 2x / week   PT Duration 8 weeks   PT Treatment/Interventions ADLs/Self Care Home Management;Therapeutic activities;Patient/family education;DME Instruction;Therapeutic exercise;Gait training;Balance training;Manual techniques;Stair training;Neuromuscular re-education   PT Next Visit Plan Continue with gait and balance activities.      Problem List Patient Active Problem List   Diagnosis Date Noted  . Dysarthria due to cerebrovascular accident 01/09/2015  . Hemiparesis affecting dominant side as late effect of cerebrovascular accident 12/07/2014  . Spastic neurogenic bladder 10/31/2014  . Right rotator cuff tendonitis 10/19/2014  . Stenosis of cervical spine region   . Chronic right shoulder pain   . Cerebral infarction due to thrombosis of left middle cerebral artery   . Left pontine stroke   . TIA (transient ischemic attack) 10/14/2014  . Hypokalemia 10/14/2014  . Overactive bladder 10/14/2014  . CVA (cerebral infarction) 10/14/2014  . URI 11/26/2010  . NECK PAIN, CHRONIC 10/29/2010  . Essential hypertension 10/10/2009  . INSOMNIA 09/15/2009  . Pure hypercholesterolemia 11/28/2008  . MENOPAUSAL SYNDROME 11/28/2008    Willow Ora 01/11/2015, 4:35 PM  Willow Ora, PTA, [facility] [address]  [place], [place] [address] [phone] 01/11/2015, 4:35 PM

## 2015-01-16 ENCOUNTER — Ambulatory Visit: Payer: Medicare PPO | Admitting: Physical Therapy

## 2015-01-16 ENCOUNTER — Encounter: Payer: Self-pay | Admitting: Physical Therapy

## 2015-01-16 DIAGNOSIS — R279 Unspecified lack of coordination: Secondary | ICD-10-CM

## 2015-01-16 DIAGNOSIS — R262 Difficulty in walking, not elsewhere classified: Secondary | ICD-10-CM

## 2015-01-16 NOTE — Therapy (Signed)
[facility] [address] [place], [place], [address] Phone: [phone]   Fax:  [phone]  Physical Therapy Treatment  Patient Details  Name: Teresa Martinez MRN: [id number] Date of Birth: Sep 21, 1941 Referring Provider:  Marletta Lor, MD  Encounter Date: 01/16/2015      PT End of Session - 01/16/15 1022    Visit Number 6   Number of Visits 7   Date for PT Re-Evaluation 02/13/15   Authorization Type [employer] PPO--; G codes required   Authorization Time Period 2/5-3/21   Authorization - Visit Number 5   Authorization - Number of Visits 6   PT Start Time 1016   PT Stop Time 1057   PT Time Calculation (min) 41 min   Equipment Utilized During Treatment Gait belt   Activity Tolerance Patient tolerated treatment well   Behavior During Therapy [facility] for tasks assessed/performed      Past Medical History  Diagnosis Date  . Hyperlipemia   . Statin intolerance   . Menopausal syndrome     [name] [place])  . Hypertension   . Overactive bladder   . Cervical spine degeneration     C5/C6  . Chronic right shoulder pain   . Hemiparesis affecting dominant side as late effect of cerebrovascular accident 12/07/2014    Past Surgical History  Procedure Laterality Date  . Cholecystectomy      1950  . Band hemorrhoidectomy      2010  . Parotidectomy      30 years ago  . Abdominal hysterectomy      1985    There were no vitals taken for this visit.  Visit Diagnosis:  Difficulty walking  Lack of coordination      Subjective Assessment - 01/16/15 1021    Symptoms Walking on treadmill at home up to 8 minutes at level 2.0 now without issues. No falls. Only pain is her shoulder with raising her arm, OT addressing.      Treatment Gait - > 1200 feet on outdoor surfaces (uneven pavement, grass, pine needles, and mulch) with supervision.  Up/down curb x 3 reps with no balance loss, increased  time needed.   Neuro Re-ed On red mats - forward/backward: high knee marches, toe walk, heel walk, and tandem walk all 3 laps each way. Up to min assist needed for balance at times.  - with tall cones x 6 in row at edge: alternating toe taps to each with side stepping, alternating double toe taps to each with side stepping and alternating flip over/up to each with side stepping x 1 lap each way with up to min assist for balance.  - standing with feet across blue foam beam: alternating forward heel taps and alternating backward toe taps x 10 each bil sides with occasional need of UE assist on parallel bars.        PT Short Term Goals - 01/09/15 1612    PT SHORT TERM GOAL #1   Title Pt will demonstrate correct performance of HEP to address muscle weakness, impaired balance and coordination. Target: 01/13/15   Status Achieved   PT SHORT TERM GOAL #2   Title Pt will increase score on Functional Gait Assessment to 21/30 for improving balance. Target: 01/13/15   Baseline on 2/22- 20/30   Status Not Met   PT SHORT TERM GOAL #3   Title Pt will increase gait speed without assistive device to 2.8 ft/sec for community ambulator status. Target: 01/13/15  Baseline on 2/22- 2.72 ft/sec without AD   Status Not Met   PT SHORT TERM GOAL #4   Title Pt will ambulate on even, indoor surfaces independently x500' without loss of balance. Target: 01/13/15   Status Achieved           PT Long Term Goals - 12/16/14 0758    PT LONG TERM GOAL #1   Title Pt will verbalize understanding of CVA warning signs and risk factors. Target: 02/13/15   PT LONG TERM GOAL #2   Title Pt will increase score on Functional Gait Assessment to 25/30 for decreased fall risk. Target: 02/13/15   PT LONG TERM GOAL #3   Title Pt will negotiate stairs modified independent with single hand rail and step over step pattern for improved efficiency and safety with home access. Target: 02/13/15   PT LONG TERM GOAL #4   Title Pt will  power walk on treadmill independently x10 minutes to demonstrate increased endurance and  progress towards preparing for return to sport. Target: 02/13/15   PT LONG TERM GOAL #5   Title Pt will ambulate on outdoor, uneven grass and pinstraw and negotiate curbs and ramps independently to demonstrate independent community ambulator status. Target: 02/13/15   Additional Long Term Goals   Additional Long Term Goals Yes   PT LONG TERM GOAL #6   Title Pt will increase stroke impact scale to 88% for improved function and participation in life post CVA. Target: 02/13/15           Plan - 01/16/15 1022    Clinical Impression Statement Pt continues to make steady progress toward her goals. Still challenged by balance on complaint surfaces and in single leg stance.   Rehab Potential Good   PT Frequency 2x / week   PT Duration 8 weeks   PT Treatment/Interventions ADLs/Self Care Home Management;Therapeutic activities;Patient/family education;DME Instruction;Therapeutic exercise;Gait training;Balance training;Manual techniques;Stair training;Neuromuscular re-education   PT Next Visit Plan Continue with gait and balance activities. Check LTG's in case no additional visits are authorized.      Problem List Patient Active Problem List   Diagnosis Date Noted  . Dysarthria due to cerebrovascular accident 01/09/2015  . Hemiparesis affecting dominant side as late effect of cerebrovascular accident 12/07/2014  . Spastic neurogenic bladder 10/31/2014  . Right rotator cuff tendonitis 10/19/2014  . Stenosis of cervical spine region   . Chronic right shoulder pain   . Cerebral infarction due to thrombosis of left middle cerebral artery   . Left pontine stroke   . TIA (transient ischemic attack) 10/14/2014  . Hypokalemia 10/14/2014  . Overactive bladder 10/14/2014  . CVA (cerebral infarction) 10/14/2014  . URI 11/26/2010  . NECK PAIN, CHRONIC 10/29/2010  . Essential hypertension 10/10/2009  . INSOMNIA  09/15/2009  . Pure hypercholesterolemia 11/28/2008  . MENOPAUSAL SYNDROME 11/28/2008    Willow Ora 01/16/2015, 4:17 PM  Willow Ora, PTA, [facility] [address] [place], [place] [address] [phone] 01/16/2015, 4:17 PM

## 2015-01-18 ENCOUNTER — Encounter: Payer: Self-pay | Admitting: Physical Therapy

## 2015-01-18 ENCOUNTER — Ambulatory Visit: Payer: Medicare PPO | Admitting: Physical Therapy

## 2015-01-18 ENCOUNTER — Ambulatory Visit: Payer: Medicare PPO | Attending: Physical Medicine & Rehabilitation | Admitting: Physical Therapy

## 2015-01-18 DIAGNOSIS — R279 Unspecified lack of coordination: Secondary | ICD-10-CM | POA: Diagnosis not present

## 2015-01-18 DIAGNOSIS — R262 Difficulty in walking, not elsewhere classified: Secondary | ICD-10-CM | POA: Diagnosis not present

## 2015-01-19 NOTE — Therapy (Signed)
[facility] [address] [place], [place], [address] Phone: [phone]   Fax:  [phone]  Physical Therapy Treatment  Patient Details  Name: Teresa Martinez MRN: [id number] Date of Birth: 02-Jul-1941 Referring Provider:  Marletta Lor, MD  Encounter Date: 01/18/2015      PT End of Session - 01/18/15 1142    Visit Number 7   Number of Visits 7   Date for PT Re-Evaluation 02/13/15   Authorization Type [employer] PPO--; G codes required   Authorization Time Period 2/5-3/21. 4 additional appt's approved as of 01/17/15 for a total 10 visits   Authorization - Visit Number 6   Authorization - Number of Visits 10   PT Start Time 1102   PT Stop Time 1143   PT Time Calculation (min) 41 min   Equipment Utilized During Treatment Gait belt   Activity Tolerance Patient tolerated treatment well   Behavior During Therapy [facility] for tasks assessed/performed      Past Medical History  Diagnosis Date  . Hyperlipemia   . Statin intolerance   . Menopausal syndrome     [name] [place])  . Hypertension   . Overactive bladder   . Cervical spine degeneration     C5/C6  . Chronic right shoulder pain   . Hemiparesis affecting dominant side as late effect of cerebrovascular accident 12/07/2014    Past Surgical History  Procedure Laterality Date  . Cholecystectomy      1950  . Band hemorrhoidectomy      2010  . Parotidectomy      30 years ago  . Abdominal hysterectomy      1985    There were no vitals taken for this visit.  Visit Diagnosis:  Difficulty walking  Lack of coordination  Treatment: Gait Treadmill with 1-2 UE assist: 2.0 mph x 8 minutes with cues on posture and heel strike to decrease toe scuffing with swing phase. Stairs: up/down with 1 rail, reciprocal pattern x 6 reps for strengthening and activity tolerance and instruction on 1 set.  Neuro Re-ed: -On blue mat with tall cones:  alternating forward toe taps, cross toe taps, forward double toe taps, cross double toe taps and flip over/up x 10 each bil legs with up to min assist for balance. Cues on posture and weight shifting into stance.  - inverted BOSU: forward/backward rocking, lateral rocking and mini squats x 10 each with intermittent UE assist needed on parallel bars and min assist for balance. - standing with feet across blue foam beam: alternating forward heel taps and backward toe taps x 10 each bil sides with up to min assist with no UE assist.        PT Short Term Goals - 01/09/15 1612    PT SHORT TERM GOAL #1   Title Pt will demonstrate correct performance of HEP to address muscle weakness, impaired balance and coordination. Target: 01/13/15   Status Achieved   PT SHORT TERM GOAL #2   Title Pt will increase score on Functional Gait Assessment to 21/30 for improving balance. Target: 01/13/15   Baseline on 2/22- 20/30   Status Not Met   PT SHORT TERM GOAL #3   Title Pt will increase gait speed without assistive device to 2.8 ft/sec for community ambulator status. Target: 01/13/15   Baseline on 2/22- 2.72 ft/sec without AD   Status Not Met   PT SHORT TERM GOAL #4   Title Pt will ambulate on even, indoor  surfaces independently x500' without loss of balance. Target: 01/13/15   Status Achieved           PT Long Term Goals - 12/16/14 0758    PT LONG TERM GOAL #1   Title Pt will verbalize understanding of CVA warning signs and risk factors. Target: 02/13/15   PT LONG TERM GOAL #2   Title Pt will increase score on Functional Gait Assessment to 25/30 for decreased fall risk. Target: 02/13/15   PT LONG TERM GOAL #3   Title Pt will negotiate stairs modified independent with single hand rail and step over step pattern for improved efficiency and safety with home access. Target: 02/13/15   PT LONG TERM GOAL #4   Title Pt will power walk on treadmill independently x10 minutes to demonstrate increased endurance and   progress towards preparing for return to sport. Target: 02/13/15   PT LONG TERM GOAL #5   Title Pt will ambulate on outdoor, uneven grass and pinstraw and negotiate curbs and ramps independently to demonstrate independent community ambulator status. Target: 02/13/15   Additional Long Term Goals   Additional Long Term Goals Yes   PT LONG TERM GOAL #6   Title Pt will increase stroke impact scale to 88% for improved function and participation in life post CVA. Target: 02/13/15               Plan - 01/18/15 1143    Clinical Impression Statement Pt making steady progress toward goals. Still challenged with higher level balance activities.   Rehab Potential Good   PT Frequency 2x / week   PT Duration 8 weeks   PT Treatment/Interventions ADLs/Self Care Home Management;Therapeutic activities;Patient/family education;DME Instruction;Therapeutic exercise;Gait training;Balance training;Manual techniques;Stair training;Neuromuscular re-education   PT Next Visit Plan Continue with gait and balance activities.         Problem List Patient Active Problem List   Diagnosis Date Noted  . Dysarthria due to cerebrovascular accident 01/09/2015  . Hemiparesis affecting dominant side as late effect of cerebrovascular accident 12/07/2014  . Spastic neurogenic bladder 10/31/2014  . Right rotator cuff tendonitis 10/19/2014  . Stenosis of cervical spine region   . Chronic right shoulder pain   . Cerebral infarction due to thrombosis of left middle cerebral artery   . Left pontine stroke   . TIA (transient ischemic attack) 10/14/2014  . Hypokalemia 10/14/2014  . Overactive bladder 10/14/2014  . CVA (cerebral infarction) 10/14/2014  . URI 11/26/2010  . NECK PAIN, CHRONIC 10/29/2010  . Essential hypertension 10/10/2009  . INSOMNIA 09/15/2009  . Pure hypercholesterolemia 11/28/2008  . MENOPAUSAL SYNDROME 11/28/2008    Willow Ora 01/19/2015, 12:51 PM  Willow Ora, PTA, [facility] [address] [place], [place] [address] [phone] 01/19/2015, 12:52 PM

## 2015-01-23 ENCOUNTER — Ambulatory Visit: Payer: Medicare PPO | Admitting: Occupational Therapy

## 2015-01-23 ENCOUNTER — Encounter: Payer: Self-pay | Admitting: Occupational Therapy

## 2015-01-23 DIAGNOSIS — R262 Difficulty in walking, not elsewhere classified: Secondary | ICD-10-CM | POA: Diagnosis not present

## 2015-01-23 DIAGNOSIS — G8191 Hemiplegia, unspecified affecting right dominant side: Secondary | ICD-10-CM

## 2015-01-23 NOTE — Patient Instructions (Signed)
Theraputty program for home to improve your hand strength. Do 1-2 times per day. STOP if you get pain and let your therapist know.  1. Make a ball 2. Make a pancake 3. Make a cone Repeat sequence 5 times  4. Make a fat hot dog. Squeeze it as hard as you can.  Do 5 times 5. Pull taffy.  Do 10 times 6. Ring around the fingers.  Do 10 times 7. 2 point pinch 8. 3 point pinch 9. Lateral pinch

## 2015-01-23 NOTE — Therapy (Signed)
[facility] [address] [place], [place], [address] Phone: [phone]   Fax:  [phone]  Occupational Therapy Treatment  Patient Details  Name: Teresa SchaumannFrances H Mintz-Whitcomb MRN: [id number] Date of Birth: 04/08/1941 Referring Provider:  Gordy SaversKwiatkowski, Peter F, MD  Encounter Date: 01/23/2015      OT End of Session - 01/23/15 1402    Visit Number 2   Number of Visits 16   Date for OT Re-Evaluation 03/03/15   OT Start Time 1315   OT Stop Time 1358   OT Time Calculation (min) 43 min   Activity Tolerance Patient tolerated treatment well      Past Medical History  Diagnosis Date  . Hyperlipemia   . Statin intolerance   . Menopausal syndrome     [name]([place])  . Hypertension   . Overactive bladder   . Cervical spine degeneration     C5/C6  . Chronic right shoulder pain   . Hemiparesis affecting dominant side as late effect of cerebrovascular accident 12/07/2014    Past Surgical History  Procedure Laterality Date  . Cholecystectomy      1950  . Band hemorrhoidectomy      2010  . Parotidectomy      30 years ago  . Abdominal hysterectomy      1985    There were no vitals taken for this visit.  Visit Diagnosis:  Hemiplegia affecting right dominant side      Subjective Assessment - 01/23/15 1320    Pertinent History see epic snapshot   Pain Score 5    Pain Location Arm  greater than 90 degress of flexion   Pain Orientation Right   Pain Type Chronic pain   Pain Onset More than a month ago   Pain Frequency Intermittent   Aggravating Factors  raising above  shoulder level, sleeping on it wrong   Pain Relieving Factors rest, not raising her arm.   Multiple Pain Sites No                 OT Treatments/Exercises (OP) - 01/23/15 0001    ADLs   Eating Pt issued red foam to assist with eating after practice to determine if helpful. Pt encouraged to use R hand to eat at least 75% of her meal.    Exercises   Exercises Hand   Hand Exercises   Theraputty Flatten;Roll;Grip;Pinch;Locate Pegs  20 pegs in red   Other Hand Exercises Pt given home exercise program using red theraputty - see pt instruction for specifics. Pt able to complete all exercises 2-3 times after demonstration and instruction.                 OT Education - 01/23/15 1401    Education provided Yes   Education Details theraputty HEP with red   Person(s) Educated Patient   Methods Explanation;Tactile cues;Demonstration;Verbal cues;Handout   Comprehension Verbalized understanding;Returned demonstration          OT Short Term Goals - 01/23/15 1404    OT SHORT TERM GOAL #1   Title Pt will be mod I with HEP - 02/03/2015   Status New   OT SHORT TERM GOAL #2   Title Pt will  demonstrate improved grip strength R hand by at least 5 pounds to assist in functional tasks (baseline 20 pounds)   Status New   OT SHORT TERM GOAL #3   Title Pt will demonstrate improved coordination as evidenced by reducing time on 9 hole  peg by 4 seconds (baseline 37.97)   Status New   OT SHORT TERM GOAL #4   Title Pt will be mod I with eating with R hand with AE prn   Status New           OT Long Term Goals - 01/23/15 1404    OT LONG TERM GOAL #1   Title Pt will be mod I with upgraded HEP - 03/03/2015   Status New   OT LONG TERM GOAL #2   Title Pt will demonstrate increased grip strength by 8 pounds to assist with functional tasks (baseline - 20 pounds)   Status New   OT LONG TERM GOAL #3   Title Pt will demonstrate improved coordination as evidenced by decreasing 9 hole peg time by 7 seconds (baseline = 37.97)   Status New   OT LONG TERM GOAL #4   Title Pt will be able to write a 3 sentence paragraph level (AE prn) legibly   Status New   OT LONG TERM GOAL #5   Title Pt will be independent with grocery shopping   Status New   OT LONG TERM GOAL #6   Title Pt will independent with chopping and preparing food for full  meal prep using R hand.   Status New   OT LONG TERM GOAL #7   Title Pt will demonstrate ability to use RUE as dominant for basic ADL tasks 100% of the time   Status New   OT LONG TERM GOAL #8   Title Pt will use RUE as dominant at least 50% of the time for IADL tasks   Status New               Plan - 01/23/15 1403    Clinical Impression Statement Reviewed all goals with patient. Instructed pt in HEP for therputty - pt able to return demonstrate. Issued red foam for eating and brushing teeth.   Pt will benefit from skilled therapeutic intervention in order to improve on the following deficits (Retired) Decreased activity tolerance;Decreased coordination;Decreased range of motion;Decreased strength;Impaired UE functional use;Other (comment)   Rehab Potential Good   OT Frequency 2x / week   OT Duration 8 weeks   OT Treatment/Interventions Self-care/ADL training;Moist Heat;DME and/or AE instruction;Energy conservation;Neuromuscular education;Therapeutic exercise;Passive range of motion;Therapeutic activities;Patient/family education   Plan check HEP, give [facility] HEP   Consulted and Agree with Plan of Care Patient        Problem List Patient Active Problem List   Diagnosis Date Noted  . Dysarthria due to cerebrovascular accident 01/09/2015  . Hemiparesis affecting dominant side as late effect of cerebrovascular accident 12/07/2014  . Spastic neurogenic bladder 10/31/2014  . Right rotator cuff tendonitis 10/19/2014  . Stenosis of cervical spine region   . Chronic right shoulder pain   . Cerebral infarction due to thrombosis of left middle cerebral artery   . Left pontine stroke   . TIA (transient ischemic attack) 10/14/2014  . Hypokalemia 10/14/2014  . Overactive bladder 10/14/2014  . CVA (cerebral infarction) 10/14/2014  . URI 11/26/2010  . NECK PAIN, CHRONIC 10/29/2010  . Essential hypertension 10/10/2009  . INSOMNIA 09/15/2009  . Pure hypercholesterolemia 11/28/2008  .  MENOPAUSAL SYNDROME 11/28/2008    Norton Pastel, OTR/L 01/23/2015, 5:12 PM  [facility] [address] [place], [place], [address] Phone: [phone]   Fax:  [phone]

## 2015-01-25 ENCOUNTER — Ambulatory Visit: Payer: Medicare PPO | Admitting: Occupational Therapy

## 2015-01-25 DIAGNOSIS — M6281 Muscle weakness (generalized): Secondary | ICD-10-CM

## 2015-01-25 DIAGNOSIS — R262 Difficulty in walking, not elsewhere classified: Secondary | ICD-10-CM | POA: Diagnosis not present

## 2015-01-25 DIAGNOSIS — R279 Unspecified lack of coordination: Secondary | ICD-10-CM

## 2015-01-25 NOTE — Therapy (Signed)
[facility] [address] [place], [place], [address] Phone: [phone]   Fax:  [phone]  Occupational Therapy Treatment  Patient Details  Name: Teresa Martinez MRN: [id number] Date of Birth: 11/21/1940 Referring Provider:  Gordy SaversKwiatkowski, Peter F, MD  Encounter Date: 01/25/2015      OT End of Session - 01/25/15 1322    Visit Number 3   Number of Visits 16   Date for OT Re-Evaluation 03/03/15   Authorization Type [employer] PPO   Authorization Time Period 6 visits authorized   Authorization - Visit Number 3   Authorization - Number of Visits 6   OT Start Time 1317   OT Stop Time 1400   OT Time Calculation (min) 43 min   Activity Tolerance Patient tolerated treatment well   Behavior During Therapy [facility] for tasks assessed/performed      Past Medical History  Diagnosis Date  . Hyperlipemia   . Statin intolerance   . Menopausal syndrome     [name]([place])  . Hypertension   . Overactive bladder   . Cervical spine degeneration     C5/C6  . Chronic right shoulder pain   . Hemiparesis affecting dominant side as late effect of cerebrovascular accident 12/07/2014    Past Surgical History  Procedure Laterality Date  . Cholecystectomy      1950  . Band hemorrhoidectomy      2010  . Parotidectomy      30 years ago  . Abdominal hysterectomy      1985    There were no vitals taken for this visit.  Visit Diagnosis:  Lack of coordination  Generalized muscle weakness      Subjective Assessment - 01/25/15 1320    Symptoms sometimes my arm wakes me up hurting   Pertinent History see epic snapshot   Currently in Pain? Yes   Pain Score 3    Pain Location Arm   Pain Orientation Right   Pain Descriptors / Indicators Aching   Pain Type Chronic pain   Pain Onset More than a month ago   Aggravating Factors  raising arm above shoulder level, sleeping on it wrong   Pain Relieving Factors rest not raising  arm.   Multiple Pain Sites No        Treatment: Theraputic activities:Fine motor coordination HEP issued, pt returned demonstration. Self care:Tracing and writing exercises for increased legibility and accuracy, min v.c., min difficulty. Aall handwriting was legible yet pt reports it does not look like her handwriting beforehand.                   OT Education - 01/25/15 1348    Education provided Yes   Education Details coordination HEP   Person(s) Educated Patient   Methods Explanation;Demonstration;Verbal cues;Handout   Comprehension Verbalized understanding;Returned demonstration          OT Short Term Goals - 01/23/15 1404    OT SHORT TERM GOAL #1   Title Pt will be mod I with HEP - 02/03/2015   Status New   OT SHORT TERM GOAL #2   Title Pt will  demonstrate improved grip strength R hand by at least 5 pounds to assist in functional tasks (baseline 20 pounds)   Status New   OT SHORT TERM GOAL #3   Title Pt will demonstrate improved coordination as evidenced by reducing time on 9 hole peg by 4 seconds (baseline 37.97)   Status New   OT SHORT  TERM GOAL #4   Title Pt will be mod I with eating with R hand with AE prn   Status New           OT Long Term Goals - 01/23/15 1404    OT LONG TERM GOAL #1   Title Pt will be mod I with upgraded HEP - 03/03/2015   Status New   OT LONG TERM GOAL #2   Title Pt will demonstrate increased grip strength by 8 pounds to assist with functional tasks (baseline - 20 pounds)   Status New   OT LONG TERM GOAL #3   Title Pt will demonstrate improved coordination as evidenced by decreasing 9 hole peg time by 7 seconds (baseline = 37.97)   Status New   OT LONG TERM GOAL #4   Title Pt will be able to write a 3 sentence paragraph level (AE prn) legibly   Status New   OT LONG TERM GOAL #5   Title Pt will be independent with grocery shopping   Status New   OT LONG TERM GOAL #6   Title Pt will independent with chopping and  preparing food for full meal prep using R hand.   Status New   OT LONG TERM GOAL #7   Title Pt will demonstrate ability to use RUE as dominant for basic ADL tasks 100% of the time   Status New   OT LONG TERM GOAL #8   Title Pt will use RUE as dominant at least 50% of the time for IADL tasks   Status New               Problem List Patient Active Problem List   Diagnosis Date Noted  . Dysarthria due to cerebrovascular accident 01/09/2015  . Hemiparesis affecting dominant side as late effect of cerebrovascular accident 12/07/2014  . Spastic neurogenic bladder 10/31/2014  . Right rotator cuff tendonitis 10/19/2014  . Stenosis of cervical spine region   . Chronic right shoulder pain   . Cerebral infarction due to thrombosis of left middle cerebral artery   . Left pontine stroke   . TIA (transient ischemic attack) 10/14/2014  . Hypokalemia 10/14/2014  . Overactive bladder 10/14/2014  . CVA (cerebral infarction) 10/14/2014  . URI 11/26/2010  . NECK PAIN, CHRONIC 10/29/2010  . Essential hypertension 10/10/2009  . INSOMNIA 09/15/2009  . Pure hypercholesterolemia 11/28/2008  . MENOPAUSAL SYNDROME 11/28/2008    Garritt Molyneux 01/25/2015, 4:36 PM Keene Breath, OTR/L [phone] Phone: [phone] 4:36 PM 01/25/2015 [facility] [address] [place], [place], [address] Phone: [phone]   Fax:  [phone]

## 2015-01-25 NOTE — Patient Instructions (Signed)
  Coordination Activities  Perform the following activities for 20 minutes 1-2 times per day with right hand(s).   Rotate ball in fingertips (clockwise and counter-clockwise).  Toss ball between hands.  Toss ball in air and catch with the same hand.  Flip cards 1 at a time as fast as you can.  Deal cards with your thumb (Hold deck in hand and push card off top with thumb).  Pick up coins, buttons, marbles, dried beans/pasta of different sizes and place in container.  Pick up coins and place in container or coin bank.  Pick up coins and stack.  Pick up coins one at a time until you get 5-10 in your hand, then move coins from palm to fingertips to stack one at a time.  Practice writing and/or typing.  Screw together nuts and bolts, then unfasten. 

## 2015-01-30 ENCOUNTER — Ambulatory Visit: Payer: Medicare PPO | Admitting: Physical Therapy

## 2015-01-30 ENCOUNTER — Ambulatory Visit: Payer: Medicare PPO | Admitting: Occupational Therapy

## 2015-01-30 ENCOUNTER — Encounter: Payer: Self-pay | Admitting: Occupational Therapy

## 2015-01-30 ENCOUNTER — Encounter: Payer: Self-pay | Admitting: Physical Therapy

## 2015-01-30 DIAGNOSIS — G8191 Hemiplegia, unspecified affecting right dominant side: Secondary | ICD-10-CM

## 2015-01-30 DIAGNOSIS — R262 Difficulty in walking, not elsewhere classified: Secondary | ICD-10-CM

## 2015-01-30 DIAGNOSIS — M6281 Muscle weakness (generalized): Secondary | ICD-10-CM

## 2015-01-30 DIAGNOSIS — R279 Unspecified lack of coordination: Secondary | ICD-10-CM

## 2015-01-30 DIAGNOSIS — M25511 Pain in right shoulder: Secondary | ICD-10-CM

## 2015-01-30 NOTE — Therapy (Signed)
[facility] [address] [place], [place], [address] Phone: [phone]   Fax:  [phone]  Occupational Therapy Treatment  Patient Details  Name: Teresa Martinez MRN: [id number] Date of Birth: 1941/08/27 Referring Provider:  Gordy Savers, MD  Encounter Date: 01/30/2015      OT End of Session - 01/30/15 1733    Visit Number 4   Number of Visits 16   Date for OT Re-Evaluation 03/03/15   Authorization Type [employer] PPO   Authorization Time Period 6 visits authorized   OT Start Time 1446   OT Stop Time 1530   OT Time Calculation (min) 44 min   Activity Tolerance Patient tolerated treatment well      Past Medical History  Diagnosis Date  . Hyperlipemia   . Statin intolerance   . Menopausal syndrome     [name] [place])  . Hypertension   . Overactive bladder   . Cervical spine degeneration     C5/C6  . Chronic right shoulder pain   . Hemiparesis affecting dominant side as late effect of cerebrovascular accident 12/07/2014    Past Surgical History  Procedure Laterality Date  . Cholecystectomy      1950  . Band hemorrhoidectomy      2010  . Parotidectomy      30 years ago  . Abdominal hysterectomy      1985    There were no vitals filed for this visit.  Visit Diagnosis:  Hemiplegia affecting right dominant side  Pain in joint, shoulder region, right      Subjective Assessment - 01/30/15 1449    Symptoms I am getting a shot in my shoulder on Monday for the pain   Pertinent History see epic snapshot   Currently in Pain? Yes   Pain Score 3    Pain Location Arm   Pain Orientation Right   Pain Descriptors / Indicators Aching   Pain Type Chronic pain   Pain Onset More than a month ago   Pain Frequency Intermittent   Aggravating Factors  raising arm above shoulder level, sleeping on it wrong   Pain Relieving Factors rest, avoid raising arm.   Multiple Pain Sites No                     OT Treatments/Exercises (OP) - 01/30/15 0001    Neurological Re-education Exercises   Other Exercises 1 Addressed HEP for neuro re ed to begin to address progressing pt with functional reach with pain free shoulder movement. Pt's HEP in supine (see pt instructions for program) and pt able to tolerate and return demonstrate all activities. Also addressed faciliated reach in sitting to approximately 100 degrees of shoulder flexion without pain. Addressed scapular stability and stable mobility with UE ranger against wall with shoulder flexion to approximately 130 degrees with min facilitation.                OT Education - 01/30/15 1733    Education provided Yes   Education Details UE HEP   Person(s) Educated Patient   Methods Explanation;Demonstration;Tactile cues;Verbal cues;Handout   Comprehension Verbalized understanding;Returned demonstration          OT Short Term Goals - 01/30/15 1736    OT SHORT TERM GOAL #1   Title Pt will be mod I with HEP - 02/03/2015   Status Achieved   OT SHORT TERM GOAL #2   Title Pt will  demonstrate improved grip strength R  hand by at least 5 pounds to assist in functional tasks (baseline 20 pounds)   Status Achieved  30 pounds   OT SHORT TERM GOAL #3   Title Pt will demonstrate improved coordination as evidenced by reducing time on 9 hole peg by 4 seconds (baseline 37.97)   Status Achieved  31.75   OT SHORT TERM GOAL #4   Title Pt will be mod I with eating with R hand with AE prn   Status Achieved           OT Long Term Goals - 01/30/15 1737    OT LONG TERM GOAL #1   Title Pt will be mod I with upgraded HEP - 03/03/2015   Status On-going   OT LONG TERM GOAL #2   Title Pt will demonstrate increased grip strength by 8 pounds to assist with functional tasks (baseline - 20 pounds)   Status On-going   OT LONG TERM GOAL #3   Title Pt will demonstrate improved coordination as evidenced by decreasing 9 hole peg  time by 7 seconds (baseline = 37.97)   Status On-going   OT LONG TERM GOAL #4   Title Pt will be able to write a 3 sentence paragraph level (AE prn) legibly   Status On-going   OT LONG TERM GOAL #5   Title Pt will be independent with grocery shopping   Status On-going   OT LONG TERM GOAL #6   Title Pt will independent with chopping and preparing food for full meal prep using R hand.   Status On-going   OT LONG TERM GOAL #7   Title Pt will demonstrate ability to use RUE as dominant for basic ADL tasks 100% of the time   Status On-going   OT LONG TERM GOAL #8   Title Pt will use RUE as dominant at least 50% of the time for IADL tasks   Status On-going               Plan - 01/30/15 1734    Clinical Impression Statement Pt making good progress. Pt has injection next Monday for shoulder pain and only has 6 visits total approved for OT therefore will place pt on hold until 3/24 to maximze benefit of therapy after injection.   Pt will benefit from skilled therapeutic intervention in order to improve on the following deficits (Retired) Decreased activity tolerance;Decreased coordination;Decreased range of motion;Decreased strength;Impaired UE functional use;Other (comment)   Rehab Potential Good   OT Frequency 2x / week   OT Duration 8 weeks   OT Treatment/Interventions Self-care/ADL training;Moist Heat;DME and/or AE instruction;Energy conservation;Neuromuscular education;Therapeutic exercise;Passive range of motion;Therapeutic activities;Patient/family education   Plan check HEP, seek approval for additonal visits, neuro re ed for functional reach   Consulted and Agree with Plan of Care Patient        Problem List Patient Active Problem List   Diagnosis Date Noted  . Dysarthria due to cerebrovascular accident 01/09/2015  . Hemiparesis affecting dominant side as late effect of cerebrovascular accident 12/07/2014  . Spastic neurogenic bladder 10/31/2014  . Right rotator cuff  tendonitis 10/19/2014  . Stenosis of cervical spine region   . Chronic right shoulder pain   . Cerebral infarction due to thrombosis of left middle cerebral artery   . Left pontine stroke   . TIA (transient ischemic attack) 10/14/2014  . Hypokalemia 10/14/2014  . Overactive bladder 10/14/2014  . CVA (cerebral infarction) 10/14/2014  . URI 11/26/2010  . NECK PAIN, CHRONIC 10/29/2010  .  Essential hypertension 10/10/2009  . INSOMNIA 09/15/2009  . Pure hypercholesterolemia 11/28/2008  . MENOPAUSAL SYNDROME 11/28/2008    Norton Pastelulaski, Karen Halliday, OTR/L 01/30/2015, 5:38 PM   [facility] [address] [place], [place], [address] Phone: [phone]   Fax:  [phone]

## 2015-01-30 NOTE — Therapy (Signed)
[facility] [address] [place], [place], [address] Phone: [phone]   Fax:  [phone]  Physical Therapy Treatment  Patient Details  Name: Teresa Martinez MRN: [id number] Date of Birth: 1941/09/27 Referring Provider:  Marletta Lor, MD  Encounter Date: 01/30/2015      PT End of Session - 01/30/15 1537    Visit Number 8   Number of Visits 11   Date for PT Re-Evaluation 02/13/15   Authorization Type [employer] P[employer]--; G codes required   Authorization Time Period 2/5-3/21. 4 additional appt's approved as of 01/17/15 for a total 10 visits   Authorization - Visit Number 6   Authorization - Number of Visits 10   PT Start Time 6387   PT end time 1613   Total treatment time 41   Equipment Utilized During Treatment Gait belt   Activity Tolerance Patient tolerated treatment well   Behavior During Therapy [facility] for tasks assessed/performed      Past Medical History  Diagnosis Date  . Hyperlipemia   . Statin intolerance   . Menopausal syndrome     [name] [place])  . Hypertension   . Overactive bladder   . Cervical spine degeneration     C5/C6  . Chronic right shoulder pain   . Hemiparesis affecting dominant side as late effect of cerebrovascular accident 12/07/2014    Past Surgical History  Procedure Laterality Date  . Cholecystectomy      1950  . Band hemorrhoidectomy      2010  . Parotidectomy      30 years ago  . Abdominal hysterectomy      1985    There were no vitals filed for this visit.  Visit Diagnosis:  Generalized muscle weakness  Lack of coordination  Difficulty walking      Subjective Assessment - 01/30/15 1535    Symptoms Up to 16 minutes with treadmill walking at home at level 2.0 (tried 2.5 one day and got too hot, so slowed back down). No falls. Still with right shoulder pain, OT addressing.   Currently in Pain? Yes   Pain Score 3    Pain Location Shoulder   Pain Orientation Right   Pain Descriptors / Indicators Aching;Sore   Pain Type Chronic pain   Pain Onset More than a month ago   Pain Frequency Intermittent   Aggravating Factors  raising arm up above shoulder level   Pain Relieving Factors rest , not raising arm     Treatment: Gait Without AD over indoor level and outdoor paved, grass and pine needles. Occasional cues needed for foot clearance with swing phase on inclines/declines. No loss of balance noted. Pt did veer at times with environment scanning/distractions.  Neuro Re-ed: In parallel bars on inverted BOSU: No UE assist needed with up to min assist for balance - rocks forward/backward and left/right x 10 each way; mini squats x 10 reps.  In parallel bars with blue foam beam: - with feet across beam: alternating forward heel taps and backward toe taps x 10 each bil sides. Light UE assist needed for balance. - tandem gait forward/backward and side stepping left/right x 3 laps each with occasional need of UE assist for balance.  By mat tanding on airex with tall cones next to it: - alternating forward toe taps, cross toe taps, forward double toe taps, cross double toe taps and flipping over/up x 10 each with bil feet. Min assist for balance with on UE  assist.  Blue mat with 3 low hurdles on it and one on floor right after mat ends: - 6 laps without AD with up to mod assist for balance. Cues on posture, weight shifting and for increased hip/knee flexion to assist with clearing the hurdles.         PT Short Term Goals - 01/09/15 1612    PT SHORT TERM GOAL #1   Title Pt will demonstrate correct performance of HEP to address muscle weakness, impaired balance and coordination. Target: 01/13/15   Status Achieved   PT SHORT TERM GOAL #2   Title Pt will increase score on Functional Gait Assessment to 21/30 for improving balance. Target: 01/13/15   Baseline on 2/22- 20/30   Status Not Met   PT SHORT TERM GOAL #3   Title Pt will  increase gait speed without assistive device to 2.8 ft/sec for community ambulator status. Target: 01/13/15   Baseline on 2/22- 2.72 ft/sec without AD   Status Not Met   PT SHORT TERM GOAL #4   Title Pt will ambulate on even, indoor surfaces independently x500' without loss of balance. Target: 01/13/15   Status Achieved           PT Long Term Goals - 12/16/14 0758    PT LONG TERM GOAL #1   Title Pt will verbalize understanding of CVA warning signs and risk factors. Target: 02/13/15   PT LONG TERM GOAL #2   Title Pt will increase score on Functional Gait Assessment to 25/30 for decreased fall risk. Target: 02/13/15   PT LONG TERM GOAL #3   Title Pt will negotiate stairs modified independent with single hand rail and step over step pattern for improved efficiency and safety with home access. Target: 02/13/15   PT LONG TERM GOAL #4   Title Pt will power walk on treadmill independently x10 minutes to demonstrate increased endurance and  progress towards preparing for return to sport. Target: 02/13/15   PT LONG TERM GOAL #5   Title Pt will ambulate on outdoor, uneven grass and pinstraw and negotiate curbs and ramps independently to demonstrate independent community ambulator status. Target: 02/13/15   Additional Long Term Goals   Additional Long Term Goals Yes   PT LONG TERM GOAL #6   Title Pt will increase stroke impact scale to 88% for improved function and participation in life post CVA. Target: 02/13/15           Plan - 01/30/15 1537    Clinical Impression Pt progessing well towards goals. Challenged most with single limb stance activities and hurdles.   Rehab Potential Good   PT Frequency 2x / week   PT Duration 8 weeks   PT Treatment/Interventions ADLs/Self Care Home Management;Therapeutic activities;Patient/family education;DME Instruction;Therapeutic exercise;Gait training;Balance training;Manual techniques;Stair training;Neuromuscular re-education   PT Next Visit Plan Continue with  gait and balance activities.         Problem List Patient Active Problem List   Diagnosis Date Noted  . Dysarthria due to cerebrovascular accident 01/09/2015  . Hemiparesis affecting dominant side as late effect of cerebrovascular accident 12/07/2014  . Spastic neurogenic bladder 10/31/2014  . Right rotator cuff tendonitis 10/19/2014  . Stenosis of cervical spine region   . Chronic right shoulder pain   . Cerebral infarction due to thrombosis of left middle cerebral artery   . Left pontine stroke   . TIA (transient ischemic attack) 10/14/2014  . Hypokalemia 10/14/2014  . Overactive bladder 10/14/2014  . CVA (cerebral infarction)  10/14/2014  . URI 11/26/2010  . NECK PAIN, CHRONIC 10/29/2010  . Essential hypertension 10/10/2009  . INSOMNIA 09/15/2009  . Pure hypercholesterolemia 11/28/2008  . MENOPAUSAL SYNDROME 11/28/2008    Willow Ora 01/31/2015, 10:31 PM  Willow Ora, PTA, [facility] [address], [place],  [address] [phone] 01/31/2015, 10:31 PM

## 2015-01-30 NOTE — Patient Instructions (Addendum)
Arm Exercises for Home:  Do 1-2 times per day. STOP if you get shoulder pain and let your therapist know!  1.Lay on your back.  Hold ball between your hands on your chest.  Reach ball toward ceiling and HOLD for a slow count of 5. Return ball to chest. Do 15, rest do 15 more.  2. Lay on your back. Hold ball between your hands. Reach for ceiling until elbows are straight.  Keeping elbows straight and always reaching, slowly raise ball over head until you reach the point of discomfort, then reach toward your knees (keeping elbows straight)  GO SLOWLY!  Do 15, rest the do 15 more.   We will add more to this once you have your injection.

## 2015-02-02 ENCOUNTER — Encounter: Payer: Medicare Other | Admitting: Occupational Therapy

## 2015-02-02 ENCOUNTER — Ambulatory Visit: Payer: Medicare PPO

## 2015-02-02 DIAGNOSIS — R4701 Aphasia: Secondary | ICD-10-CM

## 2015-02-02 DIAGNOSIS — R471 Dysarthria and anarthria: Secondary | ICD-10-CM

## 2015-02-02 DIAGNOSIS — R262 Difficulty in walking, not elsewhere classified: Secondary | ICD-10-CM | POA: Diagnosis not present

## 2015-02-02 NOTE — Patient Instructions (Signed)
  Pt was provided list of multisyllabic and triasyllabic words for her to repeat with overarticulation, 10 minutes twice a day.

## 2015-02-02 NOTE — Therapy (Signed)
[facility] [address] [place], [place], [address] Phone: [phone]   Fax:  [phone]  Speech Language Pathology Evaluation  Patient Details  Name: Teresa Martinez MRN: [id number] Date of Birth: 11-23-1940 Referring Provider:  Gordy Savers, MD  Encounter Date: 02/02/2015      End of Session - 02/02/15 1658    Visit Number 1   Number of Visits 16   Date for SLP Re-Evaluation 04/03/15   Authorization Type [employer]   SLP Start Time 1450   SLP Stop Time  1530   SLP Time Calculation (min) 40 min   Activity Tolerance Patient tolerated treatment well      Past Medical History  Diagnosis Date  . Hyperlipemia   . Statin intolerance   . Menopausal syndrome     [name])  . Hypertension   . Overactive bladder   . Cervical spine degeneration     C5/C6  . Chronic right shoulder pain   . Hemiparesis affecting dominant side as late effect of cerebrovascular accident 12/07/2014    Past Surgical History  Procedure Laterality Date  . Cholecystectomy      1950  . Band hemorrhoidectomy      2010  . Parotidectomy      30 years ago  . Abdominal hysterectomy      1985    There were no vitals filed for this visit.  Visit Diagnosis: Expressive aphasia  Dysarthria      Subjective Assessment - 02/02/15 1457    Symptoms "I just don't feel like talking - I'm having to push myself to talk"            SLP Evaluation [facility] - 02/02/15 1454    Pain Assessment   Currently in Pain? Yes   Pain Score 3    Pain Location Shoulder   Pain Orientation Right   Pain Type Chronic pain   Pain Onset More than a month ago   Pain Frequency Intermittent   Pain Relieving Factors rest   General Information   HPI Pt with CVA 10-14-14. Pt reports dysarthria (worse in night) and anomia that decr pt's QOL.   Prior Functional Status   Cognitive/Linguistic Baseline Within functional limits    Cognition   Overall Cognitive Status Within Functional Limits for tasks assessed   Auditory Comprehension   Overall Auditory Comprehension Appears within functional limits for tasks assessed   Expression   Primary Mode of Expression Verbal   Verbal Expression   Overall Verbal Expression Impaired   Level of Generative/Spontaneous Verbalization Conversation   Repetition Impaired   Level of Impairment Sentence level   Effective Techniques Phonemic cues;Semantic cues;Open ended questions   Other Verbal Expression Comments Pt with occasional anomic episodes during initial conversation, assisted by SLP phonemic, semantic cues as well as direct questioning. Pt visibly upset re: these episodes.     Oral Motor/Sensory Function   Labial ROM Within Functional Limits   Labial Strength Within Functional Limits   Labial Coordination Reduced   Lingual ROM Within Functional Limits   Lingual Symmetry Within Functional Limits   Lingual Strength Reduced Right   Lingual Coordination Reduced   Facial Symmetry Within Functional Limits   Overall Oral Motor/Sensory Function Slightly incoordinated lingual and labial alternative motion    Motor Speech   Overall Motor Speech Impaired   Articulation Impaired   Level of Impairment Conversation   Intelligibility Intelligibility reduced   Conversation 75-100% accurate  95-100%  currently. Pt states worse when fatigued/evening   Motor Planning Witnin functional limits   Effective Techniques Over-articulate;Slow rate   Standardized Assessments   Standardized Assessments  Boston Naming Test-2nd edition   Boston Naming Test-2nd edition  50/60 (>49 is WNL). SLP would expect based upon pt's level of education for this to be higher than low-normal              SLP Education - 03/01/15 1658    Education provided Yes   Education Details HEP for dysarthria   Person(s) Educated Patient   Methods Explanation;Handout   Comprehension Verbalized understanding           SLP Short Term Goals - March 01, 2015 1700    SLP SHORT TERM GOAL #1   Title pt to demo independence with dysarthria HEP (overarticulation, reduced speech rate) (March 01, 2015)   Time 4   Period Weeks   Status New   SLP SHORT TERM GOAL #2   Title pt will engage in 15 minutes mod complex conversation demonstrating independent use of compensations for dysarthria (03/01/2015)   Time 4   Period Weeks   Status New   SLP SHORT TERM GOAL #3   Title pt will utilize compensations for anomia in 15 minutes mod complex conversation independently (March 01, 2015)   Time 4   Period Weeks   Status New   SLP SHORT TERM GOAL #4   Title pt will complete mod complex naming tasks with 80% success (03/01/2015)   Time 4   Period Weeks   Status New          SLP Long Term Goals - Mar 01, 2015 1703    SLP LONG TERM GOAL #1   Title pt to demo compensations for dysarthria in 15 minutes mod complex/complex conversation (01-Mar-2015)   Time 8   Period Weeks   Status New   SLP LONG TERM GOAL #2   Title pt to engage in mod complex/complex conversation for 15 minutes utilizing anomia compensations independently (03/01/2015)   Time 8   Period Weeks   Status New          Plan - March 01, 2015 1659    Clinical Impression Statement Pt presents with mild anomia and mild dysarthria and would benefit from skilled ST to improve QOL decreased by symptoms from these deficits.   Speech Therapy Frequency 2x / week   Duration --  8 weeks   Treatment/Interventions Cueing hierarchy;Multimodal communcation approach;Functional tasks;Patient/family education;Compensatory strategies;SLP instruction and feedback;Internal/external aids;Environmental controls   Potential to Achieve Goals Good          G-Codes - Mar 01, 2015 1706    Functional Assessment Tool Used noms-6   Functional Limitations Spoken language expressive   Spoken Language Expression Current Status [phone]) At least 1 percent but less than 20 percent impaired, limited or restricted    Spoken Language Expression Goal Status (U0454) At least 1 percent but less than 20 percent impaired, limited or restricted      Problem List Patient Active Problem List   Diagnosis Date Noted  . Dysarthria due to cerebrovascular accident 01/09/2015  . Hemiparesis affecting dominant side as late effect of cerebrovascular accident 12/07/2014  . Spastic neurogenic bladder 10/31/2014  . Right rotator cuff tendonitis 10/19/2014  . Stenosis of cervical spine region   . Chronic right shoulder pain   . Cerebral infarction due to thrombosis of left middle cerebral artery   . Left pontine stroke   . TIA (transient ischemic attack) 10/14/2014  . Hypokalemia 10/14/2014  .  Overactive bladder 10/14/2014  . CVA (cerebral infarction) 10/14/2014  . URI 11/26/2010  . NECK PAIN, CHRONIC 10/29/2010  . Essential hypertension 10/10/2009  . INSOMNIA 09/15/2009  . Pure hypercholesterolemia 11/28/2008  . MENOPAUSAL SYNDROME 11/28/2008    Jana HalfSCHINKE,Ansh Fauble,SLP 02/02/2015, 5:08 PM  [facility] [address] [place], [place], [address] Phone: [phone]   Fax:  [phone]

## 2015-02-03 ENCOUNTER — Other Ambulatory Visit: Payer: Self-pay | Admitting: Family Medicine

## 2015-02-04 MED ORDER — LISINOPRIL 20 MG PO TABS: 20.0000 mg | ORAL_TABLET | Freq: Every day | ORAL | Status: DC

## 2015-02-04 MED ORDER — ROSUVASTATIN CALCIUM 10 MG PO TABS: ORAL_TABLET | ORAL | Status: DC

## 2015-02-04 MED ORDER — FESOTERODINE FUMARATE ER 4 MG PO TB24: 4.0000 mg | ORAL_TABLET | Freq: Every day | ORAL | Status: DC

## 2015-02-06 ENCOUNTER — Ambulatory Visit (HOSPITAL_BASED_OUTPATIENT_CLINIC_OR_DEPARTMENT_OTHER): Payer: Medicare PPO | Admitting: Physical Medicine & Rehabilitation

## 2015-02-06 ENCOUNTER — Encounter: Payer: Medicare Other | Admitting: Occupational Therapy

## 2015-02-06 ENCOUNTER — Ambulatory Visit: Payer: Medicare PPO

## 2015-02-06 ENCOUNTER — Encounter: Payer: Self-pay | Admitting: Physical Medicine & Rehabilitation

## 2015-02-06 ENCOUNTER — Encounter: Payer: Medicare PPO | Attending: Physical Medicine & Rehabilitation

## 2015-02-06 VITALS — BP 142/87 | HR 74 | Resp 14

## 2015-02-06 DIAGNOSIS — R262 Difficulty in walking, not elsewhere classified: Secondary | ICD-10-CM | POA: Diagnosis not present

## 2015-02-06 DIAGNOSIS — M7581 Other shoulder lesions, right shoulder: Secondary | ICD-10-CM

## 2015-02-06 DIAGNOSIS — R279 Unspecified lack of coordination: Secondary | ICD-10-CM | POA: Insufficient documentation

## 2015-02-06 DIAGNOSIS — M7501 Adhesive capsulitis of right shoulder: Secondary | ICD-10-CM | POA: Insufficient documentation

## 2015-02-06 DIAGNOSIS — G8111 Spastic hemiplegia affecting right dominant side: Secondary | ICD-10-CM | POA: Insufficient documentation

## 2015-02-06 NOTE — Progress Notes (Signed)
Subjective:    Patient ID: Teresa Martinez, female    DOB: Jul 17, 1941, 74 y.o.   MRN: [id number]  HPI   Pain Inventory Average Pain 3 Pain Right Now 3 My pain is intermittent and sharp  In the last 24 hours, has pain interfered with the following? General activity 0 Relation with others 0 Enjoyment of life 0 What TIME of day is your pain at its worst? morning Sleep (in general) Fair  Pain is worse with: some activites Pain improves with: injections Relief from Meds: n/a  Mobility walk without assistance how many minutes can you walk? 20+ ability to climb steps?  yes transfers alone  Function retired I need assistance with the following:  meal prep, household duties and shopping Do you have any goals in this area?  yes  Neuro/Psych bladder control problems numbness  Prior Studies Any changes since last visit?  no  Physicians involved in your care Any changes since last visit?  no   Family History  Problem Relation Age of Onset  . Heart failure Father   . Diabetes Mother   . Hypertension Mother   . Stroke Mother   . Cirrhosis Sister   . Hyperlipidemia Brother   . Hypertension Brother   . Stroke Brother   . Diabetes Brother    History   Social History  . Marital Status: Married    Spouse Name: N/A  . Number of Children: 4  . Years of Education: N/A   Occupational History  . retired    Social History Main Topics  . Smoking status: Never Smoker   . Smokeless tobacco: Never Used  . Alcohol Use: No  . Drug Use: No  . Sexual Activity: Not on file   Other Topics Concern  . None   Social History Narrative   Regular exercise, 4 children, 6 grandchildren, one stepchild and two additional stepgrandchildren   Patient is right handed.   Patient doesn't drink caffeine.   Past Surgical History  Procedure Laterality Date  . Cholecystectomy      1950  . Band hemorrhoidectomy      2010  . Parotidectomy      30 years ago  . Abdominal  hysterectomy      1985   Past Medical History  Diagnosis Date  . Hyperlipemia   . Statin intolerance   . Menopausal syndrome     [name])  . Hypertension   . Overactive bladder   . Cervical spine degeneration     C5/C6  . Chronic right shoulder pain   . Hemiparesis affecting dominant side as late effect of cerebrovascular accident 12/07/2014   BP 142/87 mmHg  Pulse 74  Resp 14  SpO2 97%  Opioid Risk Score:   Fall Risk Score: Low Fall Risk (0-5 points)`1  Depression screen PHQ 2/9  Depression screen [facility] 2/9 02/06/2015 08/26/2014  Decreased Interest 0 0  Down, Depressed, Hopeless 0 0  PHQ - 2 Score 0 0  Altered sleeping 2 -  Tired, decreased energy 1 -  Change in appetite 0 -  Feeling bad or failure about yourself  0 -  Trouble concentrating 0 -  Moving slowly or fidgety/restless 1 -  Suicidal thoughts 0 -  PHQ-9 Score 4 -     Review of Systems  Genitourinary:       Bladder control problems   Neurological: Positive for numbness.       Objective:   Physical Exam  Assessment & Plan:  Shoulder injection Right With ultrasound guidance  Indication: Right Shoulder pain not relieved by medication management and other conservative care.  Informed consent was obtained after describing risks and benefits of the procedure with the patient, this includes bleeding, bruising, infection and medication side effects. The patient wishes to proceed and has given written consent. Patient was placed in a seated position. The Right shoulder was marked and prepped with betadine in the subacromial area. A 25-gauge 1-1/2 inch needle was inserted into the subacromial area. After negative draw back for blood, a solution containing 1 mL of 6 mg per ML betamethasone and 4 mL of 1% lidocaine was injected. A band aid was applied. The patient tolerated the procedure well. Post procedure instructions were given.

## 2015-02-06 NOTE — Therapy (Signed)
[facility] [address] [place], [place], [address] Phone: [phone]   Fax:  [phone]  Physical Therapy Treatment  Patient Details  Name: Teresa Martinez MRN: [id number] Date of Birth: [birth year] Referring Provider:  Marletta Lor, MD  Encounter Date: 02/06/2015      PT End of Session - 02/06/15 1655    Visit Number 9   Number of Visits 11   Date for PT Re-Evaluation 02/13/15   Authorization Type [employer] PPO--; G codes required   Authorization Time Period 2/5-3/21. 4 additional appt's approved as of 01/17/15 for a total 10 visits   Authorization - Visit Number 7   Authorization - Number of Visits 10   PT Start Time 8341   PT Stop Time 1530   PT Time Calculation (min) 38 min      Past Medical History  Diagnosis Date  . Hyperlipemia   . Statin intolerance   . Menopausal syndrome     [name] [place])  . Hypertension   . Overactive bladder   . Cervical spine degeneration     C5/C6  . Chronic right shoulder pain   . Hemiparesis affecting dominant side as late effect of cerebrovascular accident 12/07/2014    Past Surgical History  Procedure Laterality Date  . Cholecystectomy      1950  . Band hemorrhoidectomy      2010  . Parotidectomy      30 years ago  . Abdominal hysterectomy      1985    There were no vitals filed for this visit.  Visit Diagnosis:  Lack of coordination  Difficulty walking      Subjective Assessment - 02/06/15 1455    Symptoms Up to 22 minutes at 2.5 mph at home. She completed this this morning.  Pt had cortisone injection on the R shoulder today.    Currently in Pain? No/denies     Neuro re-ed: -Seated right hip hiking with pt reaching overhead using the left hand and therapist providing facilitation of quadratus lumborum. -therapist passively mobilized the pelvis into anterior and posterior rotation then active assisted, then active with pt in  sidelying -performed sidelying hip hiking with therapist tactile facilitation  Gait training -x400' with supervision and pt demonstrating lack of pelvic mobility initially (prior to neuro-re-ed) as well as occasional R foot drop and decreased left step length and decreased toe off.  -Training in parallel bars without support with pt practicing repeated pre-swing (anterior/posterior rocking with staggered feet) with tactile cues for posture -stepping over obstacles to promote improved pre-swing and step length with verbal cues for heel strike and tactile cues for posture. -Gait training again on ground without UE support x200' with improved pelvic mobility, and preswing continues to have unequal step length.                           PT Short Term Goals - 01/09/15 1612    PT SHORT TERM GOAL #1   Title Pt will demonstrate correct performance of HEP to address muscle weakness, impaired balance and coordination. Target: 01/13/15   Status Achieved   PT SHORT TERM GOAL #2   Title Pt will increase score on Functional Gait Assessment to 21/30 for improving balance. Target: 01/13/15   Baseline on 2/22- 20/30   Status Not Met   PT SHORT TERM GOAL #3   Title Pt will increase gait speed without assistive device to 2.8  ft/sec for community ambulator status. Target: 01/13/15   Baseline on 2/22- 2.72 ft/sec without AD   Status Not Met   PT SHORT TERM GOAL #4   Title Pt will ambulate on even, indoor surfaces independently x500' without loss of balance. Target: 01/13/15   Status Achieved           PT Long Term Goals - 12/16/14 0758    PT LONG TERM GOAL #1   Title Pt will verbalize understanding of CVA warning signs and risk factors. Target: 02/13/15   PT LONG TERM GOAL #2   Title Pt will increase score on Functional Gait Assessment to 25/30 for decreased fall risk. Target: 02/13/15   PT LONG TERM GOAL #3   Title Pt will negotiate stairs modified independent with single hand rail  and step over step pattern for improved efficiency and safety with home access. Target: 02/13/15   PT LONG TERM GOAL #4   Title Pt will power walk on treadmill independently x10 minutes to demonstrate increased endurance and  progress towards preparing for return to sport. Target: 02/13/15   PT LONG TERM GOAL #5   Title Pt will ambulate on outdoor, uneven grass and pinstraw and negotiate curbs and ramps independently to demonstrate independent community ambulator status. Target: 02/13/15   Additional Long Term Goals   Additional Long Term Goals Yes   PT LONG TERM GOAL #6   Title Pt will increase stroke impact scale to 88% for improved function and participation in life post CVA. Target: 02/13/15               Plan - 02/06/15 1656    Clinical Impression Statement Demonstrated decreased pelvic mobility at start of session, improved by end of session. Continues to need training for heel strike and pre-swing as well as increasing LLE step length.        Problem List Patient Active Problem List   Diagnosis Date Noted  . Adhesive capsulitis of right shoulder 02/06/2015  . Dysarthria due to cerebrovascular accident 01/09/2015  . Hemiparesis affecting dominant side as late effect of cerebrovascular accident 12/07/2014  . Spastic neurogenic bladder 10/31/2014  . Right rotator cuff tendonitis 10/19/2014  . Stenosis of cervical spine region   . Chronic right shoulder pain   . Cerebral infarction due to thrombosis of left middle cerebral artery   . Left pontine stroke   . TIA (transient ischemic attack) 10/14/2014  . Hypokalemia 10/14/2014  . Overactive bladder 10/14/2014  . CVA (cerebral infarction) 10/14/2014  . URI 11/26/2010  . NECK PAIN, CHRONIC 10/29/2010  . Essential hypertension 10/10/2009  . INSOMNIA 09/15/2009  . Pure hypercholesterolemia 11/28/2008  . MENOPAUSAL SYNDROME 11/28/2008   Delrae Sawyers, PT,DPT,NCS 02/06/2015 4:59 PM Phone [phone] FAX  [phone]         [facility] [address] [place], [place], [address] Phone: [phone]   Fax:  [phone]

## 2015-02-06 NOTE — Patient Instructions (Signed)
Joint Injection  Care After  Refer to this sheet in the next few days. These instructions provide you with information on caring for yourself after you have had a joint injection. Your caregiver also may give you more specific instructions. Your treatment has been planned according to current medical practices, but problems sometimes occur. Call your caregiver if you have any problems or questions after your procedure.  After any type of joint injection, it is not uncommon to experience:  · Soreness, swelling, or bruising around the injection site.  · Mild numbness, tingling, or weakness around the injection site caused by the numbing medicine used before or with the injection.  It also is possible to experience the following effects associated with the specific agent after injection:  · Iodine-based contrast agents:  ¨ Allergic reaction (itching, hives, widespread redness, and swelling beyond the injection site).  · Corticosteroids (These effects are rare.):  ¨ Allergic reaction.  ¨ Increased blood sugar levels (If you have diabetes and you notice that your blood sugar levels have increased, notify your caregiver).  ¨ Increased blood pressure levels.  ¨ Mood swings.  · Hyaluronic acid in the use of viscosupplementation.  ¨ Temporary heat or redness.  ¨ Temporary rash and itching.  ¨ Increased fluid accumulation in the injected joint.  These effects all should resolve within a day after your procedure.   HOME CARE INSTRUCTIONS  · Limit yourself to light activity the day of your procedure. Avoid lifting heavy objects, bending, stooping, or twisting.  · Take prescription or over-the-counter pain medication as directed by your caregiver.  · You may apply ice to your injection site to reduce pain and swelling the day of your procedure. Ice may be applied 03-04 times:  ¨ Put ice in a plastic bag.  ¨ Place a towel between your skin and the bag.  ¨ Leave the ice on for no longer than 15-20 minutes each time.  SEEK  IMMEDIATE MEDICAL CARE IF:   · Pain and swelling get worse rather than better or extend beyond the injection site.  · Numbness does not go away.  · Blood or fluid continues to leak from the injection site.  · You have chest pain.  · You have swelling of your face or tongue.  · You have trouble breathing or you become dizzy.  · You develop a fever, chills, or severe tenderness at the injection site that last longer than 1 day.  MAKE SURE YOU:  · Understand these instructions.  · Watch your condition.  · Get help right away if you are not doing well or if you get worse.  Document Released: 07/18/2011 Document Revised: 01/27/2012 Document Reviewed: 07/18/2011  ExitCare® Patient Information ©2015 [employer]. This information is not intended to replace advice given to you by your health care provider. Make sure you discuss any questions you have with your health care provider.

## 2015-02-09 ENCOUNTER — Ambulatory Visit: Payer: Medicare PPO | Admitting: Occupational Therapy

## 2015-02-09 ENCOUNTER — Encounter: Payer: Self-pay | Admitting: Occupational Therapy

## 2015-02-09 DIAGNOSIS — M25511 Pain in right shoulder: Secondary | ICD-10-CM

## 2015-02-09 DIAGNOSIS — M6281 Muscle weakness (generalized): Secondary | ICD-10-CM

## 2015-02-09 DIAGNOSIS — G8191 Hemiplegia, unspecified affecting right dominant side: Secondary | ICD-10-CM

## 2015-02-09 DIAGNOSIS — R262 Difficulty in walking, not elsewhere classified: Secondary | ICD-10-CM | POA: Diagnosis not present

## 2015-02-09 NOTE — Therapy (Signed)
[facility] [address] [place], [place], [address] Phone: [phone]   Fax:  [phone]  Occupational Therapy Treatment  Patient Details  Name: Teresa SchaumannFrances H Mintz-Whitcomb MRN: [id number] Date of Birth: 12/08/1940 Referring Provider:  Gordy SaversKwiatkowski, Peter F, MD  Encounter Date: 02/09/2015      OT End of Session - 02/09/15 1704    Visit Number 5   Number of Visits 16   Date for OT Re-Evaluation 03/03/15   Authorization Type [employer] PPO   Authorization Time Period 6 visits authorized   OT Start Time 1315   OT Stop Time 1358   OT Time Calculation (min) 43 min   Activity Tolerance Patient tolerated treatment well      Past Medical History  Diagnosis Date  . Hyperlipemia   . Statin intolerance   . Menopausal syndrome     [name][name])  . Hypertension   . Overactive bladder   . Cervical spine degeneration     C5/C6  . Chronic right shoulder pain   . Hemiparesis affecting dominant side as late effect of cerebrovascular accident 12/07/2014    Past Surgical History  Procedure Laterality Date  . Cholecystectomy      1950  . Band hemorrhoidectomy      2010  . Parotidectomy      30 years ago  . Abdominal hysterectomy      1985    There were no vitals filed for this visit.  Visit Diagnosis:  Hemiplegia affecting right dominant side  Generalized muscle weakness  Pain in joint, shoulder region, right      Subjective Assessment - 02/09/15 1322    Symptoms I had an injection in my R shoulder this past Monday and I can raise it higher now without pain.   Pertinent History see epic snapshot   Currently in Pain? Yes   Pain Score 4    Pain Location Arm   Pain Orientation Right   Pain Descriptors / Indicators Aching   Pain Type Acute pain  pt caught her arm in the bed sheets today   Pain Onset Today   Pain Frequency Intermittent   Aggravating Factors  hyperextending shoulder   Pain Relieving Factors  avoiding movement                    OT Treatments/Exercises (OP) - 02/09/15 0001    Neurological Re-education Exercises   Other Exercises 1 Neuro re ed to upgrade HEP for pt to complete UE program in sitting vs supine. Pt with significantly improved RUE ROM after injection to R shoulder.  Focused on closed chain, closed chain with resistance to open chain overhead reaching.                    OT Short Term Goals - 02/09/15 1706    OT SHORT TERM GOAL #1   Title Pt will be mod I with HEP - 02/03/2015   Status Achieved   OT SHORT TERM GOAL #2   Title Pt will  demonstrate improved grip strength R hand by at least 5 pounds to assist in functional tasks (baseline 20 pounds)   Status Achieved  30 pounds   OT SHORT TERM GOAL #3   Title Pt will demonstrate improved coordination as evidenced by reducing time on 9 hole peg by 4 seconds (baseline 37.97)   Status Achieved  31.75   OT SHORT TERM GOAL #4   Title Pt will be mod I  with eating with R hand with AE prn   Status Achieved           OT Long Term Goals - 02/09/15 1706    OT LONG TERM GOAL #1   Title Pt will be mod I with upgraded HEP - 03/03/2015   Status On-going   OT LONG TERM GOAL #2   Title Pt will demonstrate increased grip strength by 8 pounds to assist with functional tasks (baseline - 20 pounds)   Status On-going   OT LONG TERM GOAL #3   Title Pt will demonstrate improved coordination as evidenced by decreasing 9 hole peg time by 7 seconds (baseline = 37.97)   Status On-going   OT LONG TERM GOAL #4   Title Pt will be able to write a 3 sentence paragraph level (AE prn) legibly   Status On-going   OT LONG TERM GOAL #5   Title Pt will be independent with grocery shopping   Status On-going   OT LONG TERM GOAL #6   Title Pt will independent with chopping and preparing food for full meal prep using R hand.   Status On-going   OT LONG TERM GOAL #7   Title Pt will demonstrate ability to use RUE as  dominant for basic ADL tasks 100% of the time   Status On-going   OT LONG TERM GOAL #8   Title Pt will use RUE as dominant at least 50% of the time for IADL tasks   Status On-going               Plan - 02/09/15 1704    Clinical Impression Statement Pt with increased pain free ROM in LUE with injection this past Monday - pt now able to address overhead reach without shoulder pain and even begin to take some resistance.    Pt will benefit from skilled therapeutic intervention in order to improve on the following deficits (Retired) Decreased activity tolerance;Decreased coordination;Decreased range of motion;Decreased strength;Impaired UE functional use;Other (comment)   Rehab Potential Good   OT Frequency 2x / week   OT Duration 8 weeks   OT Treatment/Interventions Self-care/ADL training;Moist Heat;DME and/or AE instruction;Energy conservation;Neuromuscular education;Therapeutic exercise;Passive range of motion;Therapeutic activities;Patient/family education   Plan Sixth visit!! Awaiting insurance approval for additional visits.  check goals.  Neuro re ed for overhead reach and strengthening of proximal shoulder    Consulted and Agree with Plan of Care Patient        Problem List Patient Active Problem List   Diagnosis Date Noted  . Adhesive capsulitis of right shoulder 02/06/2015  . Dysarthria due to cerebrovascular accident 01/09/2015  . Hemiparesis affecting dominant side as late effect of cerebrovascular accident 12/07/2014  . Spastic neurogenic bladder 10/31/2014  . Right rotator cuff tendonitis 10/19/2014  . Stenosis of cervical spine region   . Chronic right shoulder pain   . Cerebral infarction due to thrombosis of left middle cerebral artery   . Left pontine stroke   . TIA (transient ischemic attack) 10/14/2014  . Hypokalemia 10/14/2014  . Overactive bladder 10/14/2014  . CVA (cerebral infarction) 10/14/2014  . URI 11/26/2010  . NECK PAIN, CHRONIC 10/29/2010  .  Essential hypertension 10/10/2009  . INSOMNIA 09/15/2009  . Pure hypercholesterolemia 11/28/2008  . MENOPAUSAL SYNDROME 11/28/2008    Norton Pastel, OTR/L 02/09/2015, 5:07 PM  [facility] [address] [place], [place], [address] Phone: [phone]   Fax:  [phone]

## 2015-02-09 NOTE — Patient Instructions (Signed)
Home Exercise Program for Arms:  Do 1-2 times per day  1. Sit on firm surface. Hold ball between your hands.  Keeping elbows straight, slowly raise arms above your head then lower. Do 15, rest and do 15 more.  2. Sit on firm surface. Hold ball between your hands at chest level.  Reach ball straight out until arms are straight. HOLD BALL OUT FOR SLOW COUNT OF 5. Return ball to chest. Do 15, rest then do 15 more.  3.  Sit on firm surface.  With arms straight by your sides and palms facing out, raise arms like a jumping jack as far as you can. Try and keep your elbows straight.  Do 15, rest and do 15 more.

## 2015-02-13 ENCOUNTER — Ambulatory Visit: Payer: Medicare PPO | Admitting: Occupational Therapy

## 2015-02-13 ENCOUNTER — Encounter: Payer: Self-pay | Admitting: Occupational Therapy

## 2015-02-13 DIAGNOSIS — R262 Difficulty in walking, not elsewhere classified: Secondary | ICD-10-CM | POA: Diagnosis not present

## 2015-02-13 DIAGNOSIS — G8191 Hemiplegia, unspecified affecting right dominant side: Secondary | ICD-10-CM

## 2015-02-13 DIAGNOSIS — M25511 Pain in right shoulder: Secondary | ICD-10-CM

## 2015-02-13 NOTE — Patient Instructions (Signed)
Stretching program for your right arm. Do 1-2 times per day or whenever your arm feels tight!  You should feel a stretch but not pain.  Go as far as your pain will allow you tt go.  1.  Lay on your back with hands behind your head.  Your head and shoulders should stay relaxed.  Do not pull on your head with your hands.  SLOWLY lower elbows down toward your pillow and hold for a slow count of 5.  Return to starting position. Do 12, rest and do 12 more.   2.  Stand beside wall at the end of a wall and place your right hand flat on wall at slightly above hip level.  Keep elbow straight and turn left foot away from wall. Hold for 5.  Return to starting position.  Do 12, rest and do 12 more.  3.  Push ups off the wall.  Think about slowly lowering body toward wall with your arms and then push body away from wall with your arms until elbows are straight.  Do 12, rest and do 12 more.  4.  Walk the wall. Face the wall with hands on flat on the wall and elbows bent.  Walk your hands up the wall as far as you can. Hold for count of five then walk them back down.  Do 12, rest then do 12 more.

## 2015-02-13 NOTE — Therapy (Signed)
[facility] [address] [place], [place], [address] Phone: [phone]   Fax:  [phone]  Occupational Therapy Treatment  Patient Details  Name: Teresa Martinez MRN: [id number] Date of Birth: 12/28/1940 Referring Provider:  Gordy SaversKwiatkowski, Peter F, MD  Encounter Date: 02/13/2015      OT End of Session - 02/13/15 1707    Visit Number 6   Number of Visits 16   Date for OT Re-Evaluation 03/03/15   Authorization Type [employer] PPO   Authorization Time Period 6 visits authorized   OT Start Time 1315   OT Stop Time 1359   OT Time Calculation (min) 44 min   Activity Tolerance Patient tolerated treatment well      Past Medical History  Diagnosis Date  . Hyperlipemia   . Statin intolerance   . Menopausal syndrome     [name]([place])  . Hypertension   . Overactive bladder   . Cervical spine degeneration     C5/C6  . Chronic right shoulder pain   . Hemiparesis affecting dominant side as late effect of cerebrovascular accident 12/07/2014    Past Surgical History  Procedure Laterality Date  . Cholecystectomy      1950  . Band hemorrhoidectomy      2010  . Parotidectomy      30 years ago  . Abdominal hysterectomy      1985    There were no vitals filed for this visit.  Visit Diagnosis:  Hemiplegia affecting right dominant side  Pain in joint, shoulder region, right      Subjective Assessment - 02/13/15 1321    Symptoms (p) Shoulder feels better just a little pain from tightness in the joint.   Pertinent History (p) see epic snapshot   Currently in Pain? (p) Yes   Pain Score (p) 2    Pain Location (p) Shoulder   Pain Orientation (p) Right   Pain Descriptors / Indicators (p) Tightness   Pain Type (p) Chronic pain   Pain Onset (p) More than a month ago   Pain Frequency (p) Intermittent   Aggravating Factors  (p) hyperextending shoulder and abduction over head   Pain Relieving Factors (p) avoid  movement.                    OT Treatments/Exercises (OP) - 02/13/15 0001    Neurological Re-education Exercises   Other Exercises 1 Neuro re ed to address activities to reduce shoulder tightness, improve alignment, address ability to have more normal movement pattern (specifically elbow extension with overhead reach with wrist extension and open hand). Pt able to return demonstrate all activities and given as a HEP - see pt instruction for details. Pt with improved AROM and stated shoulder felt fell less restricted with movement.   Modalities   Modalities Moist Heat  simultaneous with manual therapy   Moist Heat Therapy   Number Minutes Moist Heat 8 Minutes   Moist Heat Location Shoulder   Manual Therapy   Manual Therapy Joint mobilization;Myofascial release   Joint Mobilization Joint mob to R shoulder to reduce stiffness and tightness and facilitate AROM with good results   Myofascial Release Myofascial release to reduce tightness in shoulder girdle to allow for more fluid and increased ROM in R shoulder                  OT Short Term Goals - 02/13/15 1709    OT SHORT TERM GOAL #1  Title Pt will be mod I with HEP - 02/03/2015   Status Achieved   OT SHORT TERM GOAL #2   Title Pt will  demonstrate improved grip strength R hand by at least 5 pounds to assist in functional tasks (baseline 20 pounds)   Status Achieved  30 pounds   OT SHORT TERM GOAL #3   Title Pt will demonstrate improved coordination as evidenced by reducing time on 9 hole peg by 4 seconds (baseline 37.97)   Status Achieved  31.75   OT SHORT TERM GOAL #4   Title Pt will be mod I with eating with R hand with AE prn   Status Achieved           OT Long Term Goals - 02/13/15 1709    OT LONG TERM GOAL #1   Title Pt will be mod I with upgraded HEP - 03/03/2015   Status On-going   OT LONG TERM GOAL #2   Title Pt will demonstrate increased grip strength by 8 pounds to assist with functional  tasks (baseline - 20 pounds)   Status On-going   OT LONG TERM GOAL #3   Title Pt will demonstrate improved coordination as evidenced by decreasing 9 hole peg time by 7 seconds (baseline = 37.97)   Status On-going   OT LONG TERM GOAL #4   Title Pt will be able to write a 3 sentence paragraph level (AE prn) legibly   Status On-going   OT LONG TERM GOAL #5   Title Pt will be independent with grocery shopping   Status On-going   OT LONG TERM GOAL #6   Title Pt will independent with chopping and preparing food for full meal prep using R hand.   Status On-going   OT LONG TERM GOAL #7   Title Pt will demonstrate ability to use RUE as dominant for basic ADL tasks 100% of the time   Status Achieved   OT LONG TERM GOAL #8   Title Pt will use RUE as dominant at least 50% of the time for IADL tasks   Status On-going               Plan - 02/13/15 1708    Clinical Impression Statement Pt making excellent progress and very motivated to achieve maximum use of RUE. Awaiting approval from insurance for continued therapy beyond 6 visits.   Pt will benefit from skilled therapeutic intervention in order to improve on the following deficits (Retired) Decreased activity tolerance;Decreased coordination;Decreased range of motion;Decreased strength;Impaired UE functional use;Other (comment)   Rehab Potential Good   OT Frequency 2x / week   OT Duration 8 weeks   OT Treatment/Interventions Self-care/ADL training;Moist Heat;DME and/or AE instruction;Energy conservation;Neuromuscular education;Therapeutic exercise;Passive range of motion;Therapeutic activities;Patient/family education   Plan If insurance approves additional visits, address overhead reach with strengthening   Consulted and Agree with Plan of Care Patient        Problem List Patient Active Problem List   Diagnosis Date Noted  . Adhesive capsulitis of right shoulder 02/06/2015  . Dysarthria due to cerebrovascular accident 01/09/2015   . Hemiparesis affecting dominant side as late effect of cerebrovascular accident 12/07/2014  . Spastic neurogenic bladder 10/31/2014  . Right rotator cuff tendonitis 10/19/2014  . Stenosis of cervical spine region   . Chronic right shoulder pain   . Cerebral infarction due to thrombosis of left middle cerebral artery   . Left pontine stroke   . TIA (transient ischemic attack) 10/14/2014  .  Hypokalemia 10/14/2014  . Overactive bladder 10/14/2014  . CVA (cerebral infarction) 10/14/2014  . URI 11/26/2010  . NECK PAIN, CHRONIC 10/29/2010  . Essential hypertension 10/10/2009  . INSOMNIA 09/15/2009  . Pure hypercholesterolemia 11/28/2008  . MENOPAUSAL SYNDROME 11/28/2008    Norton Pastel, OTR/L 02/13/2015, 5:11 PM  [facility] [address] [place], [place], [address] Phone: [phone]   Fax:  [phone]

## 2015-02-14 ENCOUNTER — Encounter: Payer: Self-pay | Admitting: Occupational Therapy

## 2015-02-14 ENCOUNTER — Ambulatory Visit: Payer: Medicare PPO | Admitting: Occupational Therapy

## 2015-02-14 ENCOUNTER — Ambulatory Visit: Payer: Medicare PPO

## 2015-02-14 ENCOUNTER — Telehealth: Payer: Self-pay | Admitting: Internal Medicine

## 2015-02-14 DIAGNOSIS — R4701 Aphasia: Secondary | ICD-10-CM

## 2015-02-14 DIAGNOSIS — M6281 Muscle weakness (generalized): Secondary | ICD-10-CM

## 2015-02-14 DIAGNOSIS — R279 Unspecified lack of coordination: Secondary | ICD-10-CM

## 2015-02-14 DIAGNOSIS — R262 Difficulty in walking, not elsewhere classified: Secondary | ICD-10-CM

## 2015-02-14 DIAGNOSIS — M25511 Pain in right shoulder: Secondary | ICD-10-CM

## 2015-02-14 DIAGNOSIS — G8191 Hemiplegia, unspecified affecting right dominant side: Secondary | ICD-10-CM

## 2015-02-14 DIAGNOSIS — R471 Dysarthria and anarthria: Secondary | ICD-10-CM

## 2015-02-14 NOTE — Therapy (Signed)
[facility] [address] [place], [place], [address] Phone: [phone]   Fax:  [phone]  Occupational Therapy Treatment  Patient Details  Name: Teresa Martinez MRN: [id number] Date of Birth: 05-06-1941 Referring Provider:  Gordy Savers, MD  Encounter Date: 02/14/2015      OT End of Session - 02/14/15 1401    Visit Number 6  eval plus 6 treatments   Number of Visits 17   Date for OT Re-Evaluation 03/03/15   Authorization Type [employer] PPO   Authorization Time Period 6 visits authorized   OT Start Time 1315   OT Stop Time 1357   OT Time Calculation (min) 42 min      Past Medical History  Diagnosis Date  . Hyperlipemia   . Statin intolerance   . Menopausal syndrome     [name])  . Hypertension   . Overactive bladder   . Cervical spine degeneration     C5/C6  . Chronic right shoulder pain   . Hemiparesis affecting dominant side as late effect of cerebrovascular accident 12/07/2014    Past Surgical History  Procedure Laterality Date  . Cholecystectomy      1950  . Band hemorrhoidectomy      2010  . Parotidectomy      30 years ago  . Abdominal hysterectomy      1985    There were no vitals filed for this visit.  Visit Diagnosis:  Hemiplegia affecting right dominant side  Pain in joint, shoulder region, right  Generalized muscle weakness      Subjective Assessment - 02/14/15 1322    Symptoms I feel sleepy today but no pain from yesterday   Pertinent History see epic snapshot   Currently in Pain? No/denies                    OT Treatments/Exercises (OP) - 02/14/15 0001    Neurological Re-education Exercises   Other Weight-Bearing Exercises 1 Neuro re ed to address overhead reach in sitting with elbow extension, neutral to external rotation in closed chain then  wrist extension and open hand.    Moist Heat Therapy   Number Minutes Moist Heat 10 Minutes   simultaneous with manal therapy   Manual Therapy   Manual Therapy Joint mobilization;Myofascial release   Joint Mobilization joint mob and myofascial release to decrease tightness in shoulder capsule, decrease tightness in shoulder girdle muscles to allow more AROM for overhead reach and horizontal abduction.                  OT Short Term Goals - 02/14/15 1403    OT SHORT TERM GOAL #1   Title Pt will be mod I with HEP - 02/03/2015   Status Achieved   OT SHORT TERM GOAL #2   Title Pt will  demonstrate improved grip strength R hand by at least 5 pounds to assist in functional tasks (baseline 20 pounds)   Status Achieved  30 pounds   OT SHORT TERM GOAL #3   Title Pt will demonstrate improved coordination as evidenced by reducing time on 9 hole peg by 4 seconds (baseline 37.97)   Status Achieved  31.75   OT SHORT TERM GOAL #4   Title Pt will be mod I with eating with R hand with AE prn   Status Achieved           OT Long Term Goals - 02/14/15 6962  OT LONG TERM GOAL #1   Title Pt will be mod I with upgraded HEP - 03/03/2015   Status On-going   OT LONG TERM GOAL #2   Title Pt will demonstrate increased grip strength by 8 pounds to assist with functional tasks (baseline - 20 pounds)   Status On-going   OT LONG TERM GOAL #3   Title Pt will demonstrate improved coordination as evidenced by decreasing 9 hole peg time by 7 seconds (baseline = 37.97)   Status On-going   OT LONG TERM GOAL #4   Title Pt will be able to write a 3 sentence paragraph level (AE prn) legibly   Status On-going   OT LONG TERM GOAL #5   Title Pt will be independent with grocery shopping   Status On-going   OT LONG TERM GOAL #6   Title Pt will independent with chopping and preparing food for full meal prep using R hand.   Status On-going   OT LONG TERM GOAL #7   Title Pt will demonstrate ability to use RUE as dominant for basic ADL tasks 100% of the time   Status Achieved   OT LONG TERM GOAL  #8   Title Pt will use RUE as dominant at least 50% of the time for IADL tasks   Status On-going               Plan - 02/14/15 1402    Clinical Impression Statement Pt making excellent gains with overhead reach experiencing less pain, increased range, and improved functional use   Pt will benefit from skilled therapeutic intervention in order to improve on the following deficits (Retired) Decreased activity tolerance;Decreased coordination;Decreased range of motion;Decreased strength;Impaired UE functional use;Other (comment)   Rehab Potential Good   OT Frequency 2x / week   OT Duration 8 weeks   OT Treatment/Interventions Self-care/ADL training;Moist Heat;DME and/or AE instruction;Energy conservation;Neuromuscular education;Therapeutic exercise;Passive range of motion;Therapeutic activities;Patient/family education   Plan if insurance approves additional visits, address overhead reach with strengthening   Consulted and Agree with Plan of Care Patient        Problem List Patient Active Problem List   Diagnosis Date Noted  . Adhesive capsulitis of right shoulder 02/06/2015  . Dysarthria due to cerebrovascular accident 01/09/2015  . Hemiparesis affecting dominant side as late effect of cerebrovascular accident 12/07/2014  . Spastic neurogenic bladder 10/31/2014  . Right rotator cuff tendonitis 10/19/2014  . Stenosis of cervical spine region   . Chronic right shoulder pain   . Cerebral infarction due to thrombosis of left middle cerebral artery   . Left pontine stroke   . TIA (transient ischemic attack) 10/14/2014  . Hypokalemia 10/14/2014  . Overactive bladder 10/14/2014  . CVA (cerebral infarction) 10/14/2014  . URI 11/26/2010  . NECK PAIN, CHRONIC 10/29/2010  . Essential hypertension 10/10/2009  . INSOMNIA 09/15/2009  . Pure hypercholesterolemia 11/28/2008  . MENOPAUSAL SYNDROME 11/28/2008    Norton PastelPulaski, Diamone Whistler Halliday, OTR/L 02/14/2015, 2:05 PM  [facility] [address] [place], [place], [address] Phone: [phone]   Fax:  [phone]

## 2015-02-14 NOTE — Therapy (Signed)
[facility] [address] [place], [place], [address] Phone: [phone]   Fax:  [phone]  Speech Language Pathology Treatment  Patient Details  Name: Teresa Martinez MRN: [id number] Date of Birth: 1941/04/28 Referring Provider:  Gordy Savers, MD  Encounter Date: 02/14/2015      End of Session - 02/14/15 1452    Visit Number 2   Number of Visits 16   Date for SLP Re-Evaluation 04/03/15   Authorization Type humana   Authorization Time Period 03-19-15   Authorization - Visit Number 1   Authorization - Number of Visits 6   SLP Start Time 1147   SLP Stop Time  1229   SLP Time Calculation (min) 42 min   Activity Tolerance Patient tolerated treatment well      Past Medical History  Diagnosis Date  . Hyperlipemia   . Statin intolerance   . Menopausal syndrome     [name])  . Hypertension   . Overactive bladder   . Cervical spine degeneration     C5/C6  . Chronic right shoulder pain   . Hemiparesis affecting dominant side as late effect of cerebrovascular accident 12/07/2014    Past Surgical History  Procedure Laterality Date  . Cholecystectomy      1950  . Band hemorrhoidectomy      2010  . Parotidectomy      30 years ago  . Abdominal hysterectomy      1985    There were no vitals filed for this visit.  Visit Diagnosis: Expressive aphasia  Dysarthria      Subjective Assessment - 02/14/15 1153    Symptoms "The forcing myself to talk has gone away with - the repeating of those words"               ADULT SLP TREATMENT - 02/14/15 1154    General Information   Behavior/Cognition Alert;Cooperative;Pleasant mood   Treatment Provided   Treatment provided Cognitive-Linquistic   Pain Assessment   Pain Assessment No/denies pain   Cognitive-Linquistic Treatment   Treatment focused on Dysarthria;Aphasia   Skilled Treatment SLP educated pt today re: anomia  compensations. SLP facilitated tasks for addressing anomia deficits; divergent naming (abstract) completed with average 4 responses prior to requiring SLP cues. Min-mod cues rarely necessary. Convergent naming; pt req'd rare min A for category names.   Assessment / Recommendations / Plan   Plan Continue with current plan of care   Progression Toward Goals   Progression toward goals Progressing toward goals          SLP Education - 02/14/15 1452    Education provided Yes   Education Details compensations for anomia   Person(s) Educated Patient   Methods Explanation;Demonstration   Comprehension Verbalized understanding;Returned demonstration          SLP Short Term Goals - 02/14/15 1708    SLP SHORT TERM GOAL #1   Title pt to demo independence with dysarthria HEP (overarticulation, reduced speech rate) (02-02-15)   Time 4   Period Weeks   Status On-going   SLP SHORT TERM GOAL #2   Title pt will engage in 15 minutes mod complex conversation demonstrating independent use of compensations for dysarthria (02-02-15)   Time 4   Period Weeks   Status On-going   SLP SHORT TERM GOAL #3   Title pt will utilize compensations for anomia in 15 minutes mod complex conversation independently (02-02-15)   Time 4   Period Weeks  Status On-going   SLP SHORT TERM GOAL #4   Title pt will complete mod complex naming tasks with 80% success (02-02-15)   Time 4   Period Weeks   Status On-going          SLP Long Term Goals - 02/14/15 1709    SLP LONG TERM GOAL #1   Title pt to demo compensations for dysarthria in 15 minutes mod complex/complex conversation   Time 8   Period Weeks   Status On-going   SLP LONG TERM GOAL #2   Title pt to engage in mod complex/complex conversation for 15 minutes utilizing anomia compensations independently   Time 8   Period Weeks   Status On-going          Plan - 02/14/15 1706    Clinical Impression Statement Mild expressive deficits remain. Pt  bdginning to utiliize full compensatory measures to offset deficits in expression. Skilled St remains necessary to assess success with HEP for dysarthria and carryover compensations for dysarthria and expressive difficulties outside the clinic.   Speech Therapy Frequency 2x / week   Duration --  8 weeks   Treatment/Interventions Cueing hierarchy;Multimodal communcation approach;Functional tasks;Patient/family education;Compensatory strategies;SLP instruction and feedback;Internal/external aids;Environmental controls   Potential to Achieve Goals Good        Problem List Patient Active Problem List   Diagnosis Date Noted  . Adhesive capsulitis of right shoulder 02/06/2015  . Dysarthria due to cerebrovascular accident 01/09/2015  . Hemiparesis affecting dominant side as late effect of cerebrovascular accident 12/07/2014  . Spastic neurogenic bladder 10/31/2014  . Right rotator cuff tendonitis 10/19/2014  . Stenosis of cervical spine region   . Chronic right shoulder pain   . Cerebral infarction due to thrombosis of left middle cerebral artery   . Left pontine stroke   . TIA (transient ischemic attack) 10/14/2014  . Hypokalemia 10/14/2014  . Overactive bladder 10/14/2014  . CVA (cerebral infarction) 10/14/2014  . URI 11/26/2010  . NECK PAIN, CHRONIC 10/29/2010  . Essential hypertension 10/10/2009  . INSOMNIA 09/15/2009  . Pure hypercholesterolemia 11/28/2008  . MENOPAUSAL SYNDROME 11/28/2008    Verdie MosherSCHINKE,CARL, SLP 02/14/2015, 5:09 PM  [facility] [address] [place], [place], [address] Phone: [phone]   Fax:  [phone]

## 2015-02-14 NOTE — Therapy (Signed)
[facility] [address] [place], [place], [address] Phone: [phone]   Fax:  [phone]  Physical Therapy Treatment  Patient Details  Name: Teresa Martinez MRN: [id number] Date of Birth: 04/04/1941 Referring Provider:  Marletta Lor, MD  Encounter Date: 02/14/2015      PT End of Session - 02/14/15 1706    Visit Number 10   Number of Visits 11   Date for PT Re-Evaluation 02/26/15   Authorization Type [employer] PPO--; G codes required   Authorization Time Period 2/5-3/21. 4 additional appt's approved as of 01/17/15 for a total 10 visits   Authorization - Visit Number 9   Authorization - Number of Visits 10   PT Start Time 2706   PT Stop Time 1445   PT Time Calculation (min) 40 min      Past Medical History  Diagnosis Date  . Hyperlipemia   . Statin intolerance   . Menopausal syndrome     [name])  . Hypertension   . Overactive bladder   . Cervical spine degeneration     C5/C6  . Chronic right shoulder pain   . Hemiparesis affecting dominant side as late effect of cerebrovascular accident 12/07/2014    Past Surgical History  Procedure Laterality Date  . Cholecystectomy      1950  . Band hemorrhoidectomy      2010  . Parotidectomy      30 years ago  . Abdominal hysterectomy      1985    There were no vitals filed for this visit.  Visit Diagnosis:  Generalized muscle weakness - Plan: PT plan of care cert/re-cert  Lack of coordination - Plan: PT plan of care cert/re-cert  Difficulty walking - Plan: PT plan of care cert/re-cert      Subjective Assessment - 02/14/15 1414    Symptoms Pt is now walking 30 minutes at 2.5 mph on treadmill, then with a 1 minute cool down. No falls   Currently in Pain? No/denies     Neuro Re-ed:  Single limb heel raises with contralateral LE on 8" step stool + 12" diameter ball with MOD A. Performed with LLE and RLE with emphasis on  controlled stability. Intermittent UE support.   LLE foot taps on 12" step then taps with 5 seconds holds, then tap marches with CGA then foot taps with eyes closed and CGA without UE support  Therex: SCI FIT all extremities level 2.5 x4 minutes with goal exertion level of 70%     Gait training Ambulating outside on uneven grass and concrete with supervision required due to pt's decreased attention to safety when outside and conversing.  Treadmill 1.5 x 3 minutes with verbal cues to improve toe off on each leg, and to increase LLE step length. Pt demonstrates improved gait pattern when on treadmill compared to ambulation on ground, likely due to BUE support.   Stairs up/down 4 stairs with single hand rail. MOD I with reciprocal gait pattern. Performed 3x.                  PT Education - 02/14/15 1706    Education provided Yes   Education Details Single leg stance activities--see handout   Person(s) Educated Patient   Methods Explanation;Demonstration;Handout   Comprehension Verbalized understanding;Returned demonstration          PT Short Term Goals - 01/09/15 1612    PT SHORT TERM GOAL #1   Title Pt will  demonstrate correct performance of HEP to address muscle weakness, impaired balance and coordination. Target: 01/13/15   Status Achieved   PT SHORT TERM GOAL #2   Title Pt will increase score on Functional Gait Assessment to 21/30 for improving balance. Target: 01/13/15   Baseline on 2/22- 20/30   Status Not Met   PT SHORT TERM GOAL #3   Title Pt will increase gait speed without assistive device to 2.8 ft/sec for community ambulator status. Target: 01/13/15   Baseline on 2/22- 2.72 ft/sec without AD   Status Not Met   PT SHORT TERM GOAL #4   Title Pt will ambulate on even, indoor surfaces independently x500' without loss of balance. Target: 01/13/15   Status Achieved           PT Long Term Goals - 03/07/15 1711    PT LONG TERM GOAL #1   Title Pt will  verbalize understanding of CVA warning signs and risk factors. Target: 02/13/15   PT LONG TERM GOAL #2   Title Pt will increase score on Functional Gait Assessment to 25/30 for decreased fall risk. Target: 02/13/15   PT LONG TERM GOAL #3   Title Pt will negotiate stairs modified independent with single hand rail and step over step pattern for improved efficiency and safety with home access. Target: 02/13/15   PT LONG TERM GOAL #4   Title Pt will power walk on treadmill independently x10 minutes to demonstrate increased endurance and  progress towards preparing for return to sport. Target: 02/13/15   PT LONG TERM GOAL #5   Title Pt will ambulate on outdoor, uneven grass and pinstraw and negotiate curbs and ramps independently to demonstrate independent community ambulator status. Target: 02/13/15   PT LONG TERM GOAL #6   Title Pt will increase stroke impact scale to 88% for improved function and participation in life post CVA. Target: 02/13/15               Plan - 03-07-15 1707    Clinical Impression Statement Pt is making progress toward goals. Due to decreased attention, she requires supervision when ambulating outside. She also has decreased stance time on the RLE due to weakness and decreased stability in single limb stance although all impairments are improving. Pt has one more therapy visit approved by her insurance company and will likely be ready to d/c with her HEP.   Pt will benefit from skilled therapeutic intervention in order to improve on the following deficits Abnormal gait;Decreased coordination;Decreased endurance;Decreased strength;Difficulty walking;Decreased balance   Rehab Potential Good   PT Frequency 1x / week   PT Duration --  1 more week (1 more visit)   PT Treatment/Interventions ADLs/Self Care Home Management;Therapeutic activities;Patient/family education;DME Instruction;Therapeutic exercise;Gait training;Balance training;Manual techniques;Stair training;Neuromuscular  re-education   PT Next Visit Plan finish checking goals and d/c with any necessary updates to HEP   Consulted and Agree with Plan of Care Patient          G-Codes - March 07, 2015 1713    Functional Assessment Tool Used Negotiates stairs with single hand rail MOD I; FGA was 20/30 on 2/22 (balance has improved since then--will check next visit), ambulates outdoors without assistive device on various surfaces with supervision.   Functional Limitation Mobility: Walking and moving around   Mobility: Walking and Moving Around Current Status [phone]) At least 1 percent but less than 20 percent impaired, limited or restricted   Mobility: Walking and Moving Around Goal Status ([id number]) At least 1 percent but  less than 20 percent impaired, limited or restricted      Problem List Patient Active Problem List   Diagnosis Date Noted  . Adhesive capsulitis of right shoulder 02/06/2015  . Dysarthria due to cerebrovascular accident 01/09/2015  . Hemiparesis affecting dominant side as late effect of cerebrovascular accident 12/07/2014  . Spastic neurogenic bladder 10/31/2014  . Right rotator cuff tendonitis 10/19/2014  . Stenosis of cervical spine region   . Chronic right shoulder pain   . Cerebral infarction due to thrombosis of left middle cerebral artery   . Left pontine stroke   . TIA (transient ischemic attack) 10/14/2014  . Hypokalemia 10/14/2014  . Overactive bladder 10/14/2014  . CVA (cerebral infarction) 10/14/2014  . URI 11/26/2010  . NECK PAIN, CHRONIC 10/29/2010  . Essential hypertension 10/10/2009  . INSOMNIA 09/15/2009  . Pure hypercholesterolemia 11/28/2008  . MENOPAUSAL SYNDROME 11/28/2008   Delrae Sawyers, PT,DPT,NCS 02/14/2015 5:25 PM Phone [phone] FAX [phone]         [facility] [address] [place], [place], [address] Phone: [phone]   Fax:  [phone]

## 2015-02-14 NOTE — Telephone Encounter (Signed)
Pt states [facility] called her to say dr Kirtland Bouchardk sent in only 13 tabs of rosuvastatin (CRESTOR) 10 MG tablet Pt would like the rx resent w/ correct amount of 90 tabs. But it looks like 90 was sent. pls advise

## 2015-02-14 NOTE — Patient Instructions (Signed)
  Please complete the assigned speech therapy homework and return it to your next session.  

## 2015-02-14 NOTE — Patient Instructions (Signed)
Foot taps on step stool: Stand next to your counter top with a chair right behind you. Hold counter as needed Lift left leg up and gently tap it to the step stool without putting any weight through left leg. Then lift left leg up 3" from step stool then return it gently to step stool again without putting weight through the left leg. Repeat until the right leg becomes tired.  *Be sure to keep your pelvis level with this exercise **Eventually progress to perform with your eyes closed.

## 2015-02-15 ENCOUNTER — Ambulatory Visit: Payer: Medicare PPO

## 2015-02-15 DIAGNOSIS — R471 Dysarthria and anarthria: Secondary | ICD-10-CM

## 2015-02-15 DIAGNOSIS — R4701 Aphasia: Secondary | ICD-10-CM

## 2015-02-15 DIAGNOSIS — R262 Difficulty in walking, not elsewhere classified: Secondary | ICD-10-CM | POA: Diagnosis not present

## 2015-02-15 NOTE — Telephone Encounter (Signed)
Spoke to pt, told her Rx was sent for 90 day supply and [facility] to clarify Rx to be filled due to allergy to Pravastatin. Told pt sent fax back for them to fill Crestor 90 day supply. Pt verbalized understanding.

## 2015-02-15 NOTE — Therapy (Signed)
[facility] [address] [place], [place], [address] Phone: [phone]   Fax:  [phone]  Speech Language Pathology Treatment  Patient Details  Name: Teresa Martinez MRN: [id number] Date of Birth: Dec 15, 1940 Referring Provider:  Gordy Savers, MD  Encounter Date: 02/15/2015      End of Session - 02/15/15 1402    Visit Number 3   Number of Visits 16   Date for SLP Re-Evaluation 04/03/15   Authorization Time Period 03-19-15   Authorization - Visit Number 2   Authorization - Number of Visits 6   SLP Start Time 1320   SLP Stop Time  1401   SLP Time Calculation (min) 41 min   Activity Tolerance Patient tolerated treatment well      Past Medical History  Diagnosis Date  . Hyperlipemia   . Statin intolerance   . Menopausal syndrome     [name] [place])  . Hypertension   . Overactive bladder   . Cervical spine degeneration     C5/C6  . Chronic right shoulder pain   . Hemiparesis affecting dominant side as late effect of cerebrovascular accident 12/07/2014    Past Surgical History  Procedure Laterality Date  . Cholecystectomy      1950  . Band hemorrhoidectomy      2010  . Parotidectomy      30 years ago  . Abdominal hysterectomy      1985    There were no vitals filed for this visit.  Visit Diagnosis: Expressive aphasia  Dysarthria      Subjective Assessment - 02/14/15 1153    Symptoms "The forcing myself to talk has gone away with - the repeating of those words"               ADULT SLP TREATMENT - 02/15/15 1322    General Information   Behavior/Cognition Alert;Cooperative;Pleasant mood   Treatment Provided   Treatment provided Cognitive-Linquistic   Pain Assessment   Pain Assessment No/denies pain   Cognitive-Linquistic Treatment   Treatment focused on Dysarthria;Aphasia   Skilled Treatment SLP educated pt re: compensations for pt's mild dysarthria. SLP  facilitated practice with pt unilizing compensation of overarticulation in word responese independently, in phrase/sentence responses, 100% success. Conversation re: pt's children  and grandchildren with rare errors.    Assessment / Recommendations / Plan   Plan Continue with current plan of care   Progression Toward Goals   Progression toward goals Progressing toward goals          SLP Education - 02/15/15 1401    Education provided Yes   Education Details Compensations for dysarthria   Person(s) Educated Patient   Methods Explanation;Demonstration   Comprehension Verbalized understanding;Returned demonstration          SLP Short Term Goals - 02/15/15 1404    SLP SHORT TERM GOAL #1   Title pt to demo independence with dysarthria HEP (overarticulation, reduced speech rate) (02-02-15)   Time 4   Period Weeks   Status On-going   SLP SHORT TERM GOAL #2   Title pt will engage in 15 minutes mod complex conversation demonstrating independent use of compensations for dysarthria (02-02-15)   Time 4   Period Weeks   Status On-going   SLP SHORT TERM GOAL #3   Title pt will utilize compensations for anomia in 15 minutes mod complex conversation independently (02-02-15)   Time 4   Period Weeks   Status On-going   SLP SHORT  TERM GOAL #4   Title pt will complete mod complex naming tasks with 80% success (02-02-15)   Time 4   Period Weeks   Status On-going          SLP Long Term Goals - 02/15/15 1404    SLP LONG TERM GOAL #1   Title pt to demo compensations for dysarthria in 15 minutes mod complex/complex conversation   Time 8   Period Weeks   Status On-going   SLP LONG TERM GOAL #2   Title pt to engage in mod complex/complex conversation for 15 minutes utilizing anomia compensations independently   Time 8   Period Weeks   Status On-going          Plan - 02/15/15 1402    Clinical Impression Statement Pt used compensations for anomia with cues from SLP to do so. Her use of  dysarthria compensations was excellent, given it was her first day using them.   Speech Therapy Frequency 2x / week   Duration --  8 weeks   Treatment/Interventions Cueing hierarchy;Multimodal communcation approach;Functional tasks;Patient/family education;Compensatory strategies;SLP instruction and feedback;Internal/external aids;Environmental controls   Potential to Achieve Goals Good        Problem List Patient Active Problem List   Diagnosis Date Noted  . Adhesive capsulitis of right shoulder 02/06/2015  . Dysarthria due to cerebrovascular accident 01/09/2015  . Hemiparesis affecting dominant side as late effect of cerebrovascular accident 12/07/2014  . Spastic neurogenic bladder 10/31/2014  . Right rotator cuff tendonitis 10/19/2014  . Stenosis of cervical spine region   . Chronic right shoulder pain   . Cerebral infarction due to thrombosis of left middle cerebral artery   . Left pontine stroke   . TIA (transient ischemic attack) 10/14/2014  . Hypokalemia 10/14/2014  . Overactive bladder 10/14/2014  . CVA (cerebral infarction) 10/14/2014  . URI 11/26/2010  . NECK PAIN, CHRONIC 10/29/2010  . Essential hypertension 10/10/2009  . INSOMNIA 09/15/2009  . Pure hypercholesterolemia 11/28/2008  . MENOPAUSAL SYNDROME 11/28/2008    Jana HalfSCHINKE,Ronny Ruddell,SLP 02/15/2015, 2:04 PM  [facility] [address] [place], [place], [address] Phone: [phone]   Fax:  [phone]

## 2015-02-17 ENCOUNTER — Encounter: Payer: Self-pay | Admitting: Internal Medicine

## 2015-02-17 ENCOUNTER — Ambulatory Visit (INDEPENDENT_AMBULATORY_CARE_PROVIDER_SITE_OTHER): Payer: Medicare PPO | Admitting: Internal Medicine

## 2015-02-17 VITALS — BP 128/80 | HR 73 | Temp 98.0°F | Resp 18 | Ht 66.0 in | Wt 144.0 lb

## 2015-02-17 DIAGNOSIS — I639 Cerebral infarction, unspecified: Secondary | ICD-10-CM

## 2015-02-17 DIAGNOSIS — E7800 Pure hypercholesterolemia, unspecified: Secondary | ICD-10-CM

## 2015-02-17 DIAGNOSIS — I6350 Cerebral infarction due to unspecified occlusion or stenosis of unspecified cerebral artery: Secondary | ICD-10-CM | POA: Diagnosis not present

## 2015-02-17 LAB — LIPID PANEL
Cholesterol: 264 mg/dL — ABNORMAL HIGH (ref 0–200)
HDL: 57.2 mg/dL (ref >39.00)
LDL Cholesterol: 180 mg/dL — ABNORMAL HIGH (ref 0–99)
NonHDL: 206.8
Total CHOL/HDL Ratio: 5
Triglycerides: 134 mg/dL (ref 0.0–149.0)
VLDL: 26.8 mg/dL (ref 0.0–40.0)

## 2015-02-17 MED ORDER — ROSUVASTATIN CALCIUM 10 MG PO TABS: 10.0000 mg | ORAL_TABLET | Freq: Every day | ORAL | Status: DC

## 2015-02-17 NOTE — Patient Instructions (Addendum)
Limit your sodium (Salt) intake    It is important that you exercise regularly, at least 20 minutes 3 to 4 times per week.  If you develop chest pain or shortness of breath seek  medical attention.  Return in 6 months for follow-up Insomnia Insomnia is frequent trouble falling and/or staying asleep. Insomnia can be a long term problem or a short term problem. Both are common. Insomnia can be a short term problem when the wakefulness is related to a certain stress or worry. Long term insomnia is often related to ongoing stress during waking hours and/or poor sleeping habits. Overtime, sleep deprivation itself can make the problem worse. Every little thing feels more severe because you are overtired and your ability to cope is decreased. CAUSES   Stress, anxiety, and depression.  Poor sleeping habits.  Distractions such as TV in the bedroom.  Naps close to bedtime.  Engaging in emotionally charged conversations before bed.  Technical reading before sleep.  Alcohol and other sedatives. They may make the problem worse. They can hurt normal sleep patterns and normal dream activity.  Stimulants such as caffeine for several hours prior to bedtime.  Pain syndromes and shortness of breath can cause insomnia.  Exercise late at night.  Changing time zones may cause sleeping problems (jet lag). It is sometimes helpful to have someone observe your sleeping patterns. They should look for periods of not breathing during the night (sleep apnea). They should also look to see how long those periods last. If you live alone or observers are uncertain, you can also be observed at a sleep clinic where your sleep patterns will be professionally monitored. Sleep apnea requires a checkup and treatment. Give your caregivers your medical history. Give your caregivers observations your family has made about your sleep.  SYMPTOMS   Not feeling rested in the morning.  Anxiety and restlessness at  bedtime.  Difficulty falling and staying asleep. TREATMENT   Your caregiver may prescribe treatment for an underlying medical disorders. Your caregiver can give advice or help if you are using alcohol or other drugs for self-medication. Treatment of underlying problems will usually eliminate insomnia problems.  Medications can be prescribed for short time use. They are generally not recommended for lengthy use.  Over-the-counter sleep medicines are not recommended for lengthy use. They can be habit forming.  You can promote easier sleeping by making lifestyle changes such as:  Using relaxation techniques that help with breathing and reduce muscle tension.  Exercising earlier in the day.  Changing your diet and the time of your last meal. No night time snacks.  Establish a regular time to go to bed.  Counseling can help with stressful problems and worry.  Soothing music and white noise may be helpful if there are background noises you cannot remove.  Stop tedious detailed work at least one hour before bedtime. HOME CARE INSTRUCTIONS   Keep a diary. Inform your caregiver about your progress. This includes any medication side effects. See your caregiver regularly. Take note of:  Times when you are asleep.  Times when you are awake during the night.  The quality of your sleep.  How you feel the next day. This information will help your caregiver care for you.  Get out of bed if you are still awake after 15 minutes. Read or do some quiet activity. Keep the lights down. Wait until you feel sleepy and go back to bed.  Keep regular sleeping and waking hours. Avoid naps.  Exercise regularly.  Avoid distractions at bedtime. Distractions include watching television or engaging in any intense or detailed activity like attempting to balance the household checkbook.  Develop a bedtime ritual. Keep a familiar routine of bathing, brushing your teeth, climbing into bed at the same time  each night, listening to soothing music. Routines increase the success of falling to sleep faster.  Use relaxation techniques. This can be using breathing and muscle tension release routines. It can also include visualizing peaceful scenes. You can also help control troubling or intruding thoughts by keeping your mind occupied with boring or repetitive thoughts like the old concept of counting sheep. You can make it more creative like imagining planting one beautiful flower after another in your backyard garden.  During your day, work to eliminate stress. When this is not possible use some of the previous suggestions to help reduce the anxiety that accompanies stressful situations. MAKE SURE YOU:   Understand these instructions.  Will watch your condition.  Will get help right away if you are not doing well or get worse. Document Released: 11/01/2000 Document Revised: 01/27/2012 Document Reviewed: 12/02/2007 [facility] Patient Information 2015 [place], [place]. This information is not intended to replace advice given to you by your health care provider. Make sure you discuss any questions you have with your health care provider.

## 2015-02-17 NOTE — Progress Notes (Signed)
Pre visit review using our clinic review tool, if applicable. No additional management support is needed unless otherwise documented below in the visit note. 

## 2015-02-17 NOTE — Progress Notes (Signed)
Subjective:    Patient ID: Teresa Martinez, female    DOB: 01/08/1941, 74 y.o.   MRN: [id number]  HPI  74 year old patient who is seen today in follow-up.  She has had a left pontine stroke and has had nice the neurological recovery.  She no longer walks with a cane. She is followed by rehabilitation and has had a recent right shoulder injection.  He is also scheduled for nerve conduction studies to rule out left-sided carpal tunnel syndrome.  In general doing quite well She has been intolerant of pravastatin in the past, but has done well on Crestor 5 mg twice weekly Very pleased with her progress. Only  complaint today is insomnia issues  Past Medical History  Diagnosis Date  . Hyperlipemia   . Statin intolerance   . Menopausal syndrome     [name]([place])  . Hypertension   . Overactive bladder   . Cervical spine degeneration     C5/C6  . Chronic right shoulder pain   . Hemiparesis affecting dominant side as late effect of cerebrovascular accident 12/07/2014    History   Social History  . Marital Status: Married    Spouse Name: N/A  . Number of Children: 4  . Years of Education: N/A   Occupational History  . retired    Social History Main Topics  . Smoking status: Never Smoker   . Smokeless tobacco: Never Used  . Alcohol Use: No  . Drug Use: No  . Sexual Activity: Not on file   Other Topics Concern  . Not on file   Social History Narrative   Regular exercise, 4 children, 6 grandchildren, one stepchild and two additional stepgrandchildren   Patient is right handed.   Patient doesn't drink caffeine.    Past Surgical History  Procedure Laterality Date  . Cholecystectomy      1950  . Band hemorrhoidectomy      2010  . Parotidectomy      30 years ago  . Abdominal hysterectomy      1985    Family History  Problem Relation Age of Onset  . Heart failure Father   . Diabetes Mother   . Hypertension Mother   . Stroke Mother   . Cirrhosis  Sister   . Hyperlipidemia Brother   . Hypertension Brother   . Stroke Brother   . Diabetes Brother     Allergies  Allergen Reactions  . Latex Other (See Comments)    Burns her skin  . Pravastatin Other (See Comments)    Leg pains    Current Outpatient Prescriptions on File Prior to Visit  Medication Sig Dispense Refill  . aspirin EC 325 MG EC tablet Take 1 tablet (325 mg total) by mouth daily. 100 tablet 1  . fesoterodine (TOVIAZ) 4 MG TB24 tablet Take 1 tablet (4 mg total) by mouth daily. 90 tablet 0  . lisinopril (PRINIVIL,ZESTRIL) 20 MG tablet Take 1 tablet (20 mg total) by mouth daily. 90 tablet 0  . rosuvastatin (CRESTOR) 10 MG tablet 1 half tablet twice weekly 90 tablet 0   No current facility-administered medications on file prior to visit.    BP 128/80 mmHg  Pulse 73  Temp(Src) 98 F (36.7 C) (Oral)  Resp 18  Ht 5\' 6"  (1.676 m)  Wt 144 lb (65.318 kg)  BMI 23.25 kg/m2  SpO2 97%     Review of Systems  Constitutional: Negative.   HENT: Negative for congestion, dental problem, hearing loss,  rhinorrhea, sinus pressure, sore throat and tinnitus.   Eyes: Negative for pain, discharge and visual disturbance.  Respiratory: Negative for cough and shortness of breath.   Cardiovascular: Negative for chest pain, palpitations and leg swelling.  Gastrointestinal: Negative for nausea, vomiting, abdominal pain, diarrhea, constipation, blood in stool and abdominal distention.  Genitourinary: Negative for dysuria, urgency, frequency, hematuria, flank pain, vaginal bleeding, vaginal discharge, difficulty urinating, vaginal pain and pelvic pain.  Musculoskeletal: Negative for joint swelling, arthralgias and gait problem.  Skin: Negative for rash.  Neurological: Negative for dizziness, syncope, speech difficulty, weakness, numbness and headaches.  Hematological: Negative for adenopathy.  Psychiatric/Behavioral: Negative for behavioral problems, dysphoric mood and agitation. The  patient is not nervous/anxious.        Objective:   Physical Exam  Constitutional: She is oriented to person, place, and time. She appears well-developed and well-nourished.  HENT:  Head: Normocephalic.  Right Ear: External ear normal.  Left Ear: External ear normal.  Mouth/Throat: Oropharynx is clear and moist.  Eyes: Conjunctivae and EOM are normal. Pupils are equal, round, and reactive to light.  Neck: Normal range of motion. Neck supple. No thyromegaly present.  Cardiovascular: Normal rate, regular rhythm, normal heart sounds and intact distal pulses.   Pulmonary/Chest: Effort normal and breath sounds normal.  Abdominal: Soft. Bowel sounds are normal. She exhibits no mass. There is no tenderness.  Musculoskeletal: Normal range of motion.  Lymphadenopathy:    She has no cervical adenopathy.  Neurological: She is alert and oriented to person, place, and time.  Slight right-sided dystaxia  Skin: Skin is warm and dry. No rash noted.  Psychiatric: She has a normal mood and affect. Her behavior is normal.          Assessment & Plan:   Dyslipidemia.  Will check a lipid profile.  Continue Crestor 5 mg twice daily Status post left pontine stroke.  Nice neurological improvement Hypertension, stable  Insomnia.  Sleep hygiene issues discussed.  She will consider a trial of melatonin Recheck 6 months or as needed

## 2015-02-20 ENCOUNTER — Encounter: Payer: Self-pay | Admitting: Occupational Therapy

## 2015-02-20 ENCOUNTER — Ambulatory Visit: Payer: Medicare PPO | Attending: Physical Medicine & Rehabilitation | Admitting: Occupational Therapy

## 2015-02-20 ENCOUNTER — Ambulatory Visit: Payer: Medicare PPO

## 2015-02-20 DIAGNOSIS — M6281 Muscle weakness (generalized): Secondary | ICD-10-CM

## 2015-02-20 DIAGNOSIS — R279 Unspecified lack of coordination: Secondary | ICD-10-CM | POA: Diagnosis not present

## 2015-02-20 DIAGNOSIS — R262 Difficulty in walking, not elsewhere classified: Secondary | ICD-10-CM

## 2015-02-20 NOTE — Therapy (Signed)
[facility] [address] [place], [place], [address] Phone: [phone]   Fax:  [phone]  Physical Therapy Treatment  Patient Details  Name: Teresa Martinez MRN: [id number] Date of Birth: Mar 23, 1941 Referring Provider:  Marletta Lor, MD  Encounter Date: 02/20/2015      PT End of Session - 02/20/15 1926    Visit Number 11   Number of Visits 11   Date for PT Re-Evaluation 02/26/15   Authorization Type [employer] P[employer]--; G codes required   Authorization Time Period 2/5-3/21. 4 additional appt's approved as of 01/17/15 for a total 10 visits   Authorization - Visit Number 10   Authorization - Number of Visits 10   PT Start Time 5701   PT Stop Time 1445   PT Time Calculation (min) 43 min      Past Medical History  Diagnosis Date  . Hyperlipemia   . Statin intolerance   . Menopausal syndrome     [name])  . Hypertension   . Overactive bladder   . Cervical spine degeneration     C5/C6  . Chronic right shoulder pain   . Hemiparesis affecting dominant side as late effect of cerebrovascular accident 12/07/2014    Past Surgical History  Procedure Laterality Date  . Cholecystectomy      1950  . Band hemorrhoidectomy      2010  . Parotidectomy      30 years ago  . Abdominal hysterectomy      1985    There were no vitals filed for this visit.  Visit Diagnosis:  Lack of coordination  Generalized muscle weakness  Difficulty walking      Subjective Assessment - 02/20/15 1404    Subjective Been staying busy with exercises. Today is my last day.   Currently in Pain? No/denies     Therex: Pt demonstrated correct performance of HEP. Also demonstrated correct performance of new exercise added today.  Attempted mountain climber planks, but unable due to wrist pain, then attempted mountain climber planks in forearm position, but unable due to muscle weakness, then performed  modified mountain climbers x20 with BUE supported on counter/chair.  Step back lunges+knee to chest 3x10 with RLE and 1x10 with LLE.  Gait training: Functional gait assessment 20/30 Gait training x>1000' on outdoor, grass, concrete and pinestraw with curb negotiation. Pt demonstrated episodes of imbalance on pinstraw and occasional unsteadiness on grass with decreased right foot clearance. Pt required close supervision on pinestraw, and grass.  Self Care: CVA education-see pt instructions/education section                           PT Education - 02/20/15 1925    Education provided Yes   Education Details CVA ed and one new exercise   Person(s) Educated Patient   Methods Explanation;Demonstration   Comprehension Verbalized understanding;Returned demonstration          PT Short Term Goals - 01/09/15 1612    PT SHORT TERM GOAL #1   Title Pt will demonstrate correct performance of HEP to address muscle weakness, impaired balance and coordination. Target: 01/13/15   Status Achieved   PT SHORT TERM GOAL #2   Title Pt will increase score on Functional Gait Assessment to 21/30 for improving balance. Target: 01/13/15   Baseline on 2/22- 20/30   Status Not Met   PT SHORT TERM GOAL #3   Title Pt will increase gait  speed without assistive device to 2.8 ft/sec for community ambulator status. Target: 01/13/15   Baseline on 2/22- 2.72 ft/sec without AD   Status Not Met   PT SHORT TERM GOAL #4   Title Pt will ambulate on even, indoor surfaces independently x500' without loss of balance. Target: 01/13/15   Status Achieved           PT Long Term Goals - 02/20/15 1405    PT LONG TERM GOAL #1   Title Pt will verbalize understanding of CVA warning signs and risk factors. Target: 02/13/15   Status Achieved   PT LONG TERM GOAL #2   Title Pt will increase score on Functional Gait Assessment to 25/30 for decreased fall risk. Target: 02/13/15   Status Not Met  scored 23/30    PT LONG TERM GOAL #3   Title Pt will negotiate stairs modified independent with single hand rail and step over step pattern for improved efficiency and safety with home access. Target: 02/13/15   Status Achieved   PT LONG TERM GOAL #4   Title Pt will power walk on treadmill independently x10 minutes to demonstrate increased endurance and  progress towards preparing for return to sport. Target: 02/13/15   Status Achieved  Doing this for 30 minutes per day, at 2.5 MPH and progressing for a few minutes at 3.0 MPH   PT LONG TERM GOAL #5   Title Pt will ambulate on outdoor, uneven grass and pinstraw and negotiate curbs and ramps independently to demonstrate independent community ambulator status. Target: 02/13/15   Status Not Met  Pt requires supervision on pinestraw and occasionally on uneven grass due to RLE lag   PT LONG TERM GOAL #6   Title Pt will increase stroke impact scale to 88% for improved function and participation in life post CVA. Target: 02/13/15   Status Achieved  94.4% on 02/20/2015               Plan - 02/20/15 1926    Clinical Impression Statement Pt is making progress but continues to demonstate lack of coordination, balance impairment, impaired timing of RLE in gait pattern with a lag in initial swing phase that leads to decreased foot clearance at times. This limits her safety with ambulation outdoors and significantly restricts her safety with her desire to return to running.  Pt would benefit from continued skilled PT services to address decreased coordination of the RLE and decreased balance, impaired gait pattern and decreased RLE strength.    PT Next Visit Plan Submitting for [facility] extension. If approved, work on timing of gait pattern as well as ambulating on uneven surfaces and dynamic balance        Problem List Patient Active Problem List   Diagnosis Date Noted  . Adhesive capsulitis of right shoulder 02/06/2015  . Dysarthria due to cerebrovascular  accident 01/09/2015  . Hemiparesis affecting dominant side as late effect of cerebrovascular accident 12/07/2014  . Spastic neurogenic bladder 10/31/2014  . Right rotator cuff tendonitis 10/19/2014  . Stenosis of cervical spine region   . Chronic right shoulder pain   . Cerebral infarction due to thrombosis of left middle cerebral artery   . Left pontine stroke   . TIA (transient ischemic attack) 10/14/2014  . Hypokalemia 10/14/2014  . Overactive bladder 10/14/2014  . CVA (cerebral infarction) 10/14/2014  . URI 11/26/2010  . NECK PAIN, CHRONIC 10/29/2010  . Essential hypertension 10/10/2009  . INSOMNIA 09/15/2009  . Pure hypercholesterolemia 11/28/2008  . MENOPAUSAL  SYNDROME 11/28/2008   Delrae Sawyers, PT,DPT,NCS 02/20/2015 7:49 PM Phone [phone] FAX [phone]         [facility] [address] [place], [place], [address] Phone: [phone]   Fax:  [phone]

## 2015-02-20 NOTE — Patient Instructions (Signed)
Repeated backward lunges -Stand 12-16" from counter top, facing counter and hold on with both hands -take a large step back with the right leg, and while keeping the right leg straight, bend the left knee for a backward lunge -rapidly bring the right knee up to your chest. -repeat 3 sets of 10 with right leg     Ischemic Stroke Blood carries oxygen to all areas of your body. A stroke happens when your blood does not flow to your brain like normal. If this happens, your brain will not get the oxygen it needs and brain tissue will die. This is an emergency. Problems (symptoms) of a stroke usually happen suddenly. You may notice them when you wake up. They can include:  Loss of feeling or weakness on one side of the body (face, arm, leg).  Feeling confused.  Trouble talking or understanding.  Trouble seeing.  Trouble walking.  Feeling dizzy.  Loss of balance or coordination.  Severe headache without a cause.  Trouble reading or writing. Get help as soon as any of these problems first start. This is important.  RISK FACTORS  Risk factors are things that make it more likely for you to have a stroke. These things include:  High blood pressure (hypertension).  High cholesterol.  Diabetes.  Heart disease.  Having a buildup of fatty deposits in the blood vessels.  Having an abnormal heart rhythm (atrial fibrillation).  Being very overweight (obese).  Smoking.  Taking birth control pills, especially if you smoke.  Not being active.  Having a diet high in fats, salt, and calories.  Drinking too much alcohol.  Using illegal drugs.  Being African American.  Being over the age of 76.  Having a family history of stroke.  Having a history of blood clots, stroke, warning stroke (transient ischemic attack, TIA), or heart attack.  Sickle cell disease. HOME CARE  Take all medicines exactly as told by your doctor. Understand all your medicine instructions.  You may  need to take a medicine to thin your blood, like aspirin or warfarin. Take warfarin exactly as told.  Taking too much or too little warfarin is dangerous. Get regular blood tests as told, including the PT and INR tests. The test results help your doctor adjust your dose of warfarin. Your PT and INR levels must be done as often as told by your doctor.  Food can cause problems with warfarin and affect the results of your blood tests. This is true for foods high in vitamin K, such as spinach, kale, broccoli, cabbage, collard and turnip greens, Brussels sprouts, peas, cauliflower, seaweed, and parsley, as well as beef and pork liver, green tea, and soybean oil. Eat the same amount of food high in vitamin K. Avoid major changes in your diet. Tell your doctor before changing your diet. Talk to a food specialist (dietitian) if you have questions.  Many medicines can cause problems with warfarin and affect your PT and INR test results. Tell your doctor about all medicines you take. This includes vitamins and dietary pills (supplements). Be careful with aspirin and medicines that relieve redness, soreness, and puffiness (inflammation). Do not take or stop medicines unless your doctor tells you to.  Warfarin can cause a lot of bruising or bleeding. Hold pressure over cuts for longer than normal. Talk to your doctor about other side effects of warfarin.  Avoid sports or activities that may cause injury or bleeding.  Be careful when you shave, floss your teeth, or use  sharp objects.  Avoid alcoholic drinks or drink very little alcohol while taking warfarin. Tell your doctor if you change how much alcohol you drink.  Tell your dentist and other doctors that you take warfarin before procedures.  If you are able to swallow, eat healthy foods. Eat 5 or more servings of fruits and vegetables a day. Eat soft foods, pureed foods, or eat small bites of food so you do not choke.  Follow your diet program as told, if  you are given one.  Keep a healthy weight.  Stay active. Try to get at least 30 minutes of activity on most or all days.  Do not smoke.  Limit how much alcohol you drink even if you are not taking warfarin. Moderate alcohol use is:  No more than 2 drinks each day for men.  No more than 1 drink each day for women who are not pregnant.  Keep your home safe so you do not fall. Try:  Putting grab bars in the bedroom and bathroom.  Raising toilet seats.  Putting a seat in the shower.  Go to therapy sessions (physical, occupational, and speech) as told by your doctor.  Use a walker or cane at all times if told to do so.  Keep all doctor visits as told. GET HELP IF:  Your personality changes.  You have trouble swallowing.  You are seeing two of everything.  You are dizzy.  You have a fever.  Your skin starts to break down. GET HELP RIGHT AWAY IF:  The symptoms below may be a sign of an emergency. Do not wait to see if the symptoms go away. Call for help (911 in [place] Do not drive yourself to the hospital.  You have sudden weakness or numbness on the face, arm, or leg (especially on one side of the body).  You have sudden trouble walking or moving your arms or legs.  You have sudden confusion.  You have trouble talking or understanding.  You have sudden trouble seeing in one or both eyes.  You lose your balance or your movements are not smooth.  You have a sudden, severe headache with no known cause.  You have new chest pain or you feel your heart beating in an unsteady way.  You are partly or totally unaware of what is going on around you. Document Released: 10/24/2011 Document Revised: 03/21/2014 Document Reviewed: 06/14/2012 [facility] Patient Information 2015 [place], [place]. This information is not intended to replace advice given to you by your health care provider. Make sure you discuss any questions you have with your health care provider.

## 2015-02-20 NOTE — Patient Instructions (Signed)
Upgraded theraputty home program:  You now have two colors - red and green.  You will only use the green for certain exercises and the red for some.  1. Make a ball 2. Make a pancake 3. Make a cone. Do sequence three times with the green and 2 times with the red. Once you are more used to the green you can do all 5 repetitions with the green.  4. Make a fat hot dog.  Squeeze as hard as you can. Do 7 times with the green putty. 5. Pull taffy.  Do 10 times with the green putty. 6. Putty around the fingers - continue to do with the red putty now. 7. Pinch: 2 pt, 3 pt and lateral = continue to use the red putty for now.

## 2015-02-20 NOTE — Therapy (Signed)
[facility] [address] [place], [place], [address] Phone: [phone]   Fax:  [phone]  Occupational Therapy Treatment  Patient Details  Name: Teresa Martinez MRN: [id number] Date of Birth: 02/11/1941 Referring Provider:  Gordy SaversKwiatkowski, Peter F, MD  Encounter Date: 02/20/2015      OT End of Session - 02/20/15 1509    Visit Number 7   Number of Visits 10   Date for OT Re-Evaluation 03/03/15   Authorization Type [employer]   Authorization Time Period 3 additonal OT visits have been approved as of 02/20/2015   OT Start Time 1315   OT Stop Time 1359   OT Time Calculation (min) 44 min   Activity Tolerance Patient tolerated treatment well      Past Medical History  Diagnosis Date  . Hyperlipemia   . Statin intolerance   . Menopausal syndrome     [name]([place])  . Hypertension   . Overactive bladder   . Cervical spine degeneration     C5/C6  . Chronic right shoulder pain   . Hemiparesis affecting dominant side as late effect of cerebrovascular accident 12/07/2014    Past Surgical History  Procedure Laterality Date  . Cholecystectomy      1950  . Band hemorrhoidectomy      2010  . Parotidectomy      30 years ago  . Abdominal hysterectomy      1985    There were no vitals filed for this visit.  Visit Diagnosis:  Generalized muscle weakness  Lack of coordination      Subjective Assessment - 02/20/15 1319    Subjective  I have three more OT visits approved correct?   Pertinent History see epic snapshot   Currently in Pain? No/denies                    OT Treatments/Exercises (OP) - 02/20/15 0001    ADLs   Writing Pt able to write 3 sentence paragraph legibly without AE. Pt's hand fatigues beyond this. Issued brown foam to use as pt felt it helped with fatigue factor.   Exercises   Exercises Hand   Hand Exercises   Theraputty Flatten;Roll;Grip;Locate Pegs  green putty,  15 pegs and partial HEP see pt instruction   Hand Gripper with Small Beads 35 pounds of resistance with gripper and 1 inch blocks -pt only able to complete approximately 15 blocks due to fatigue.                OT Education - 02/20/15 1352    Education provided Yes   Education Details upgraded theraputty HEP to incorporate green putty in some of the exercises.   Person(s) Educated Patient   Methods Explanation;Demonstration;Verbal cues;Handout   Comprehension Verbalized understanding;Returned demonstration          OT Short Term Goals - 02/20/15 1333    OT SHORT TERM GOAL #1   Title Pt will be mod I with HEP - 02/03/2015   Status Achieved   OT SHORT TERM GOAL #2   Title Pt will  demonstrate improved grip strength R hand by at least 5 pounds to assist in functional tasks (baseline 20 pounds)   Status Achieved  30 pounds   OT SHORT TERM GOAL #3   Title Pt will demonstrate improved coordination as evidenced by reducing time on 9 hole peg by 4 seconds (baseline 37.97)   Status Achieved  31.75   OT SHORT TERM  GOAL #4   Title Pt will be mod I with eating with R hand with AE prn   Status Achieved           OT Long Term Goals - 02/20/15 1333    OT LONG TERM GOAL #1   Title Pt will be mod I with upgraded HEP - 03/03/2015   Status On-going   OT LONG TERM GOAL #2   Title Pt will demonstrate increased grip strength by 8 pounds to assist with functional tasks (baseline - 20 pounds)   Status Achieved  R= 35 pounds (L=65 pounds)   OT LONG TERM GOAL #3   Title Pt will demonstrate improved coordination as evidenced by decreasing 9 hole peg time by 7 seconds (baseline = 37.97)   Status Achieved  R= 25.67   OT LONG TERM GOAL #4   Title Pt will be able to write a 3 sentence paragraph level (AE prn) legibly   Status Achieved   OT LONG TERM GOAL #5   Title Pt will be independent with grocery shopping   Status Achieved   OT LONG TERM GOAL #6   Title Pt will independent with  chopping and preparing food for full meal prep using R hand.   Status On-going   OT LONG TERM GOAL #7   Title Pt will demonstrate ability to use RUE as dominant for basic ADL tasks 100% of the time   Status Achieved   OT LONG TERM GOAL #8   Title Pt will use RUE as dominant at least 50% of the time for IADL tasks   Status On-going               Plan - 02/20/15 1354    Clinical Impression Statement Pt has been approved for 3 more OT visits (including today). Pt making good progress toward goals and is very motivated to do HEP   Pt will benefit from skilled therapeutic intervention in order to improve on the following deficits (Retired) Decreased activity tolerance;Decreased coordination;Decreased range of motion;Decreased strength;Impaired UE functional use;Other (comment)   Rehab Potential Good   OT Frequency 2x / week   OT Duration 8 weeks   OT Treatment/Interventions Self-care/ADL training;Moist Heat;DME and/or AE instruction;Energy conservation;Neuromuscular education;Therapeutic exercise;Passive range of motion;Therapeutic activities;Patient/family education   Plan Check to see if pt completed meal at home.  UE strength - proximal shoulder with overhead reach and hand.   Consulted and Agree with Plan of Care Patient        Problem List Patient Active Problem List   Diagnosis Date Noted  . Adhesive capsulitis of right shoulder 02/06/2015  . Dysarthria due to cerebrovascular accident 01/09/2015  . Hemiparesis affecting dominant side as late effect of cerebrovascular accident 12/07/2014  . Spastic neurogenic bladder 10/31/2014  . Right rotator cuff tendonitis 10/19/2014  . Stenosis of cervical spine region   . Chronic right shoulder pain   . Cerebral infarction due to thrombosis of left middle cerebral artery   . Left pontine stroke   . TIA (transient ischemic attack) 10/14/2014  . Hypokalemia 10/14/2014  . Overactive bladder 10/14/2014  . CVA (cerebral infarction)  10/14/2014  . URI 11/26/2010  . NECK PAIN, CHRONIC 10/29/2010  . Essential hypertension 10/10/2009  . INSOMNIA 09/15/2009  . Pure hypercholesterolemia 11/28/2008  . MENOPAUSAL SYNDROME 11/28/2008    Norton Pastel,  OTR/L 02/20/2015, 3:11 PM  [place] [facility] [address] [place], [place], [address] Phone: [phone]  Fax:  [phone]    

## 2015-02-21 ENCOUNTER — Ambulatory Visit: Payer: Medicare PPO

## 2015-02-21 DIAGNOSIS — R262 Difficulty in walking, not elsewhere classified: Secondary | ICD-10-CM | POA: Diagnosis not present

## 2015-02-21 DIAGNOSIS — R471 Dysarthria and anarthria: Secondary | ICD-10-CM

## 2015-02-21 NOTE — Therapy (Signed)
[facility] [address] [place], [place], [address] Phone: [phone]   Fax:  [phone]  Speech Language Pathology Treatment  Patient Details  Name: Teresa Martinez MRN: [id number] Date of Birth: 06/08/1941 Referring Provider:  Gordy SaversKwiatkowski, Peter F, MD  Encounter Date: 02/21/2015      End of Session - 02/21/15 1629    Visit Number 4   Number of Visits 16   Date for SLP Re-Evaluation 04/03/15   Authorization - Visit Number 3   Authorization - Number of Visits 6   SLP Start Time 1535   SLP Stop Time  1615   SLP Time Calculation (min) 40 min   Activity Tolerance Patient tolerated treatment well      Past Medical History  Diagnosis Date  . Hyperlipemia   . Statin intolerance   . Menopausal syndrome     [name]([place]herry [place])  . Hypertension   . Overactive bladder   . Cervical spine degeneration     C5/C6  . Chronic right shoulder pain   . Hemiparesis affecting dominant side as late effect of cerebrovascular accident 12/07/2014    Past Surgical History  Procedure Laterality Date  . Cholecystectomy      1950  . Band hemorrhoidectomy      2010  . Parotidectomy      30 years ago  . Abdominal hysterectomy      1985    There were no vitals filed for this visit.  Visit Diagnosis: Dysarthria      Subjective Assessment - 02/21/15 1535    Subjective Pt with good success this past week.                ADULT SLP TREATMENT - 02/21/15 1612    General Information   Behavior/Cognition Alert;Cooperative;Pleasant mood   Treatment Provided   Treatment provided Cognitive-Linquistic   Pain Assessment   Pain Assessment No/denies pain   Cognitive-Linquistic Treatment   Treatment focused on Dysarthria   Skilled Treatment Pt with excellent use of compensations today, as she spoke for 30 minutes with approx 10 articulation errors, two of which SLP had to pause to think what pt's message was. Pt aware  of approx 75% of errors and self corrected appropriately. Expressive aphasia appears to have not been a factor today in mod complex/compelx conversation.   Assessment / Recommendations / Plan   Plan --  decr to x1/week due to progress. d/c likely in 1-3 visits.   Progression Toward Goals   Progression toward goals Progressing toward goals            SLP Short Term Goals - 02/21/15 1631    SLP SHORT TERM GOAL #1   Title pt to demo independence with dysarthria HEP (overarticulation, reduced speech rate) (02-02-15)   Time 3   Period Weeks   Status On-going   SLP SHORT TERM GOAL #2   Title pt will engage in 15 minutes mod complex conversation demonstrating independent use of compensations for dysarthria (02-02-15)   Time 3   Period Weeks   Status Achieved   SLP SHORT TERM GOAL #3   Title pt will utilize compensations for anomia in 15 minutes mod complex conversation independently (02-02-15)   Status Deferred  pt without notable anomia in mod complex/complex conversation   SLP SHORT TERM GOAL #4   Title pt will complete mod complex naming tasks with 80% success (02-02-15)   Status Deferred  SLP Long Term Goals - 02-24-15 1632    SLP LONG TERM GOAL #1   Title pt to demo compensations for dysarthria in 15 minutes mod complex/complex conversation over two sessions   Time 7   Period Weeks   Status Revised   SLP LONG TERM GOAL #2   Title pt to engage in mod complex/complex conversation for 15 minutes utilizing anomia compensations independently   Status Deferred          Plan - Feb 24, 2015 1630    Clinical Impression Statement Reduce to x1/week due to progress. Pt likely discharged in 1-3 more visits, following assurance of consistency with use of compensations outside tx room. Aphasia appears to have resolved. Pt will need motor speech G-code.   Speech Therapy Frequency 1x /week   Duration --  6 weeks   Treatment/Interventions Cueing hierarchy;Multimodal communcation  approach;Functional tasks;Patient/family education;Compensatory strategies;SLP instruction and feedback;Internal/external aids;Environmental controls   Potential to Achieve Goals Good          G-Codes - 02-24-2015 1634    Functional Limitations Spoken language expressive   Spoken Language Expression Goal Status [id number]) At least 1 percent but less than 20 percent impaired, limited or restricted   Spoken Language Expression Discharge Status [phone]) 0 percent impaired, limited or restricted      Problem List Patient Active Problem List   Diagnosis Date Noted  . Adhesive capsulitis of right shoulder 02/06/2015  . Dysarthria due to cerebrovascular accident 01/09/2015  . Hemiparesis affecting dominant side as late effect of cerebrovascular accident 12/07/2014  . Spastic neurogenic bladder 10/31/2014  . Right rotator cuff tendonitis 10/19/2014  . Stenosis of cervical spine region   . Chronic right shoulder pain   . Cerebral infarction due to thrombosis of left middle cerebral artery   . Left pontine stroke   . TIA (transient ischemic attack) 10/14/2014  . Hypokalemia 10/14/2014  . Overactive bladder 10/14/2014  . CVA (cerebral infarction) 10/14/2014  . URI 11/26/2010  . NECK PAIN, CHRONIC 10/29/2010  . Essential hypertension 10/10/2009  . INSOMNIA 09/15/2009  . Pure hypercholesterolemia 11/28/2008  . MENOPAUSAL SYNDROME 11/28/2008    [facility], SLP 02/24/2015, 4:35 PM  [place] [facility] [address] [place], [place], [address] Phone: [phone]   Fax:  [phone]

## 2015-02-23 ENCOUNTER — Encounter: Payer: Self-pay | Admitting: Occupational Therapy

## 2015-02-23 ENCOUNTER — Ambulatory Visit: Payer: Medicare PPO | Admitting: Speech Pathology

## 2015-02-23 ENCOUNTER — Ambulatory Visit: Payer: Medicare PPO | Admitting: Occupational Therapy

## 2015-02-23 DIAGNOSIS — M25511 Pain in right shoulder: Secondary | ICD-10-CM

## 2015-02-23 DIAGNOSIS — IMO0002 Reserved for concepts with insufficient information to code with codable children: Secondary | ICD-10-CM

## 2015-02-23 DIAGNOSIS — G8191 Hemiplegia, unspecified affecting right dominant side: Secondary | ICD-10-CM

## 2015-02-23 DIAGNOSIS — R262 Difficulty in walking, not elsewhere classified: Secondary | ICD-10-CM | POA: Diagnosis not present

## 2015-02-23 NOTE — Therapy (Signed)
[facility] [address] [place], [place], [address] Phone: [phone]   Fax:  [phone]  Occupational Therapy Treatment  Patient Details  Name: Teresa Martinez MRN: [id number] Date of Birth: 09/11/1941 Referring Provider:  Gordy Savers, MD  Encounter Date: 02/23/2015      OT End of Session - 02/23/15 1413    Visit Number 8   Number of Visits 10   Date for OT Re-Evaluation 03/03/15   Authorization Type [employer] P[employer]   OT Start Time 1315   OT Stop Time 1358   OT Time Calculation (min) 43 min   Activity Tolerance Patient tolerated treatment well   Behavior During Therapy [facility] for tasks assessed/performed      Past Medical History  Diagnosis Date  . Hyperlipemia   . Statin intolerance   . Menopausal syndrome     [name] [place])  . Hypertension   . Overactive bladder   . Cervical spine degeneration     C5/C6  . Chronic right shoulder pain   . Hemiparesis affecting dominant side as late effect of cerebrovascular accident 12/07/2014    Past Surgical History  Procedure Laterality Date  . Cholecystectomy      1950  . Band hemorrhoidectomy      2010  . Parotidectomy      30 years ago  . Abdominal hysterectomy      1985    There were no vitals filed for this visit.  Visit Diagnosis:  Hemiplegia affecting right dominant side  Pain in joint, shoulder region, right  Lack of coordination due to stroke      Subjective Assessment - 02/23/15 1404    Subjective  I made my own dinner, chopped cabbage and kale and grilled some chicken!   Pertinent History see epic snapshot   Currently in Pain? No/denies   Pain Score 0-No pain                    OT Treatments/Exercises (OP) - 02/23/15 0001    ADLs   Cooking Patient working toward a long term goal of being able to chop and prepare food for a full meal.  She did chop food this weekend.  She was able to chop cabbage and kale  reporting she lacked uniformity, and fine cutting skills.  Patient felt she was able to utilize adequate control, coordination and pressure to cut these softer foods.  Directed her to try cutting more firm foods; potato, pear, carrot,etc to see    Neurological Re-education Exercises   Other Exercises 1 Neuro reeducation followed manual therapy to mobilize glenohumeral joint to improve range of motion passive to active.  Reeducation to retrain movement pattern to encourage the proximal end of humerus to glide downward while distal end is elevating with varying degrees of freedom.  Patient without any report of pain with repetitive overhead reach, initially guiding motion than AROM, even challenging balance up on toes.    Other Grasp and Release Exercises  Reviewed home exercise program - putty - patient progressing well with HEP                OT Education - 02/23/15 1413    Education provided Yes   Education Details Biomechanics of forward reach   [employer]) Educated Patient   Methods Explanation;Demonstration   Comprehension Verbalized understanding          OT Short Term Goals - 02/20/15 1333    OT SHORT TERM GOAL #  1   Title Pt will be mod I with HEP - 02/03/2015   Status Achieved   OT SHORT TERM GOAL #2   Title Pt will  demonstrate improved grip strength R hand by at least 5 pounds to assist in functional tasks (baseline 20 pounds)   Status Achieved  30 pounds   OT SHORT TERM GOAL #3   Title Pt will demonstrate improved coordination as evidenced by reducing time on 9 hole peg by 4 seconds (baseline 37.97)   Status Achieved  31.75   OT SHORT TERM GOAL #4   Title Pt will be mod I with eating with R hand with AE prn   Status Achieved           OT Long Term Goals - 02/20/15 1333    OT LONG TERM GOAL #1   Title Pt will be mod I with upgraded HEP - 03/03/2015   Status On-going   OT LONG TERM GOAL #2   Title Pt will demonstrate increased grip strength by 8 pounds to  assist with functional tasks (baseline - 20 pounds)   Status Achieved  R= 35 pounds (L=65 pounds)   OT LONG TERM GOAL #3   Title Pt will demonstrate improved coordination as evidenced by decreasing 9 hole peg time by 7 seconds (baseline = 37.97)   Status Achieved  R= 25.67   OT LONG TERM GOAL #4   Title Pt will be able to write a 3 sentence paragraph level (AE prn) legibly   Status Achieved   OT LONG TERM GOAL #5   Title Pt will be independent with grocery shopping   Status Achieved   OT LONG TERM GOAL #6   Title Pt will independent with chopping and preparing food for full meal prep using R hand.   Status On-going   OT LONG TERM GOAL #7   Title Pt will demonstrate ability to use RUE as dominant for basic ADL tasks 100% of the time   Status Achieved   OT LONG TERM GOAL #8   Title Pt will use RUE as dominant at least 50% of the time for IADL tasks   Status On-going               Plan - 02/23/15 1414    Clinical Impression Statement Patient has been approved for 2 more visits including today (1 remaining).  Patient continues to report improved functional use of dominant right hand   Pt will benefit from skilled therapeutic intervention in order to improve on the following deficits (Retired) Decreased activity tolerance;Decreased coordination;Decreased range of motion;Decreased strength;Impaired UE functional use;Other (comment)   Rehab Potential Good   OT Frequency 2x / week   OT Duration 8 weeks   OT Treatment/Interventions Self-care/ADL training;Moist Heat;DME and/or AE instruction;Energy conservation;Neuromuscular education;Therapeutic exercise;Passive range of motion;Therapeutic activities;Patient/family education   Plan Discharge patient- finalize hEP   Consulted and Agree with Plan of Care Patient        Problem List Patient Active Problem List   Diagnosis Date Noted  . Adhesive capsulitis of right shoulder 02/06/2015  . Dysarthria due to cerebrovascular accident  01/09/2015  . Hemiparesis affecting dominant side as late effect of cerebrovascular accident 12/07/2014  . Spastic neurogenic bladder 10/31/2014  . Right rotator cuff tendonitis 10/19/2014  . Stenosis of cervical spine region   . Chronic right shoulder pain   . Cerebral infarction due to thrombosis of left middle cerebral artery   . Left pontine stroke   .  TIA (transient ischemic attack) 10/14/2014  . Hypokalemia 10/14/2014  . Overactive bladder 10/14/2014  . CVA (cerebral infarction) 10/14/2014  . URI 11/26/2010  . NECK PAIN, CHRONIC 10/29/2010  . Essential hypertension 10/10/2009  . INSOMNIA 09/15/2009  . Pure hypercholesterolemia 11/28/2008  . MENOPAUSAL SYNDROME 11/28/2008    Collier SalinaGellert, Adriella Essex M , OTR/L  02/23/2015, 2:17 PM  [facility] [address] [place], [place], [address] Phone: [phone]   Fax:  [phone]

## 2015-02-24 ENCOUNTER — Telehealth: Payer: Self-pay | Admitting: Internal Medicine

## 2015-02-24 NOTE — Telephone Encounter (Signed)
Pt would like donna to return her call concerning elev chole

## 2015-02-24 NOTE — Telephone Encounter (Signed)
Spoke to pt, she wanted to know if she was suppose to increase crestor due to Cholesterol elevated, she was taking 1/2 tablet twice a week. Told pt you are suppose to be taking 10 mg one tablet daily. Pt verbalized understanding.

## 2015-02-27 ENCOUNTER — Encounter: Payer: Self-pay | Admitting: Occupational Therapy

## 2015-02-27 ENCOUNTER — Ambulatory Visit: Payer: Medicare PPO | Admitting: Occupational Therapy

## 2015-02-27 DIAGNOSIS — R262 Difficulty in walking, not elsewhere classified: Secondary | ICD-10-CM | POA: Diagnosis not present

## 2015-02-27 DIAGNOSIS — G8191 Hemiplegia, unspecified affecting right dominant side: Secondary | ICD-10-CM

## 2015-02-27 DIAGNOSIS — M6281 Muscle weakness (generalized): Secondary | ICD-10-CM

## 2015-02-27 DIAGNOSIS — IMO0002 Reserved for concepts with insufficient information to code with codable children: Secondary | ICD-10-CM

## 2015-02-27 NOTE — Patient Instructions (Signed)
Ways to increase the challenge of your home exercise programs:  You currently have a theraputty exercise program, and arm exercise program and a fine motor control home program.  Below are ways you can increase the challenge at home as you continue to improve:  Theraputty program: 1. Transition to doing the entire program with the green putty.  I have also given you the blue putty. You can gradually work your way up to using the blue putty to make the ball, make a cone, make a pancake, make a hot dog and squeeze, and pull taffy.   2.  Increase the number of repetitions.   3. Increase the number of times per day or per week that you do the entire program.  Fine Motor program: 1. Increase the speed while maintaining the accuracy. 2. Decrease the size of the objects you are manipulating while decreasing the number of times you drop the items. 3. Increase the number of minutes/ day or /week that you are doing the activities.  Arm exercises: 1. Do the exercise slower. 2. Increase the number of repetitions. 3. Increase how long you "hold" a position or add "holding for a count of 5" to an exercise. 4. SLOWLY increase the weight you are using. 5. Increase the number of times you do the program/day or /week.  Choose only one thing at a time to increase. If you change up several things at once and then have pain or other issues you won't know what caused it. Can also vary what you change from day to day.

## 2015-02-27 NOTE — Therapy (Signed)
[facility] [address] [place], [place], [address] Phone: [phone]   Fax:  [phone]  Occupational Therapy Treatment  Patient Details  Name: Teresa Martinez MRN: [id number] Date of Birth: Mar 24, 1941 Referring Provider:  Marletta Lor, MD  Encounter Date: 02/27/2015      OT End of Session - 02/27/15 1354    Visit Number 9   Number of Visits 10   Date for OT Re-Evaluation 03/03/15   Authorization Type [employer] P[employer]   Authorization Time Period 3 additonal OT visits have been approved as of 02/20/2015   OT Start Time 1315   OT Stop Time 1359   OT Time Calculation (min) 44 min   Activity Tolerance Patient tolerated treatment well      Past Medical History  Diagnosis Date  . Hyperlipemia   . Statin intolerance   . Menopausal syndrome     [name])  . Hypertension   . Overactive bladder   . Cervical spine degeneration     C5/C6  . Chronic right shoulder pain   . Hemiparesis affecting dominant side as late effect of cerebrovascular accident 12/07/2014    Past Surgical History  Procedure Laterality Date  . Cholecystectomy      1950  . Band hemorrhoidectomy      2010  . Parotidectomy      30 years ago  . Abdominal hysterectomy      1985    There were no vitals filed for this visit.  Visit Diagnosis:  Hemiplegia affecting right dominant side  Lack of coordination due to stroke  Generalized muscle weakness                  OT Treatments/Exercises (OP) - 02/27/15 0001    Exercises   Exercises Hand   Hand Exercises   Hand Gripper with Small Beads 35 pounds of resistance with one inch blocks. Pt with decreased dropping.   Fine Motor Coordination In hand manipuation training   In Hand Manipulation Training addressed fine motor control with manipulating small items in functional tasks. ALso provided pt with instructions on how to increase HEP at home as she continues  to improve (see pt instructions)                OT Education - 02/27/15 1354    Education provided Yes   Education Details instrucitions to increase challenge of HEP for UE   Person(s) Educated Patient   Methods Explanation;Demonstration;Handout   Comprehension Verbalized understanding;Returned demonstration          OT Short Term Goals - 02/27/15 1319    OT SHORT TERM GOAL #1   Title Pt will be mod I with HEP - 02/03/2015   Status Achieved   OT SHORT TERM GOAL #2   Title Pt will  demonstrate improved grip strength R hand by at least 5 pounds to assist in functional tasks (baseline 20 pounds)   Status Achieved  30 pounds   OT SHORT TERM GOAL #3   Title Pt will demonstrate improved coordination as evidenced by reducing time on 9 hole peg by 4 seconds (baseline 37.97)   Status Achieved  31.75   OT SHORT TERM GOAL #4   Title Pt will be mod I with eating with R hand with AE prn   Status Achieved           OT Long Term Goals - 02/27/15 1319    OT LONG TERM GOAL #1  Title Pt will be mod I with upgraded HEP - 03/03/2015   Status Achieved   OT LONG TERM GOAL #2   Title Pt will demonstrate increased grip strength by 8 pounds to assist with functional tasks (baseline - 20 pounds)   Status Achieved  38 pounds   OT LONG TERM GOAL #3   Title Pt will demonstrate improved coordination as evidenced by decreasing 9 hole peg time by 7 seconds (baseline = 37.97)   Status Achieved  24.65   OT LONG TERM GOAL #4   Title Pt will be able to write a 3 sentence paragraph level (AE prn) legibly   Status Achieved   OT LONG TERM GOAL #5   Title Pt will be independent with grocery shopping   Status Achieved   OT LONG TERM GOAL #6   Title Pt will independent with chopping and preparing food for full meal prep using R hand.   Status Achieved   OT LONG TERM GOAL #7   Title Pt will demonstrate ability to use RUE as dominant for basic ADL tasks 100% of the time   Status Achieved   OT  LONG TERM GOAL #8   Title Pt will use RUE as dominant at least 50% of the time for IADL tasks   Status Achieved               Plan - 03-09-2015 1358    Clinical Impression Statement Pt has met or exceeded all LTG's and will be d/c today from OT.  Pt is able to use RUE as dominant for almost all actitivities.   Pt will benefit from skilled therapeutic intervention in order to improve on the following deficits (Retired) Decreased activity tolerance;Decreased coordination;Decreased range of motion;Decreased strength;Impaired UE functional use;Other (comment)   Rehab Potential Good   OT Frequency 2x / week   OT Duration 8 weeks   OT Treatment/Interventions Self-care/ADL training;Moist Heat;DME and/or AE instruction;Energy conservation;Neuromuscular education;Therapeutic exercise;Passive range of motion;Therapeutic activities;Patient/family education   Plan d/c from OT   Consulted and Agree with Plan of Care Patient   Family Member Consulted daughter          G-Codes - 09-Mar-2015 1402    Functional Limitation Carrying, moving and handling objects   Carrying, Moving and Handling Objects Current Status [phone]) At least 1 percent but less than 20 percent impaired, limited or restricted   Carrying, Moving and Handling Objects Goal Status ([id number]) At least 40 percent but less than 60 percent impaired, limited or restricted   Carrying, Moving and Handling Objects Discharge Status [phone]) At least 1 percent but less than 20 percent impaired, limited or restricted      Problem List Patient Active Problem List   Diagnosis Date Noted  . Adhesive capsulitis of right shoulder 02/06/2015  . Dysarthria due to cerebrovascular accident 01/09/2015  . Hemiparesis affecting dominant side as late effect of cerebrovascular accident 12/07/2014  . Spastic neurogenic bladder 10/31/2014  . Right rotator cuff tendonitis 10/19/2014  . Stenosis of cervical spine region   . Chronic right shoulder pain   .  Cerebral infarction due to thrombosis of left middle cerebral artery   . Left pontine stroke   . TIA (transient ischemic attack) 10/14/2014  . Hypokalemia 10/14/2014  . Overactive bladder 10/14/2014  . CVA (cerebral infarction) 10/14/2014  . URI 11/26/2010  . NECK PAIN, CHRONIC 10/29/2010  . Essential hypertension 10/10/2009  . INSOMNIA 09/15/2009  . Pure hypercholesterolemia 11/28/2008  . MENOPAUSAL SYNDROME 11/28/2008  OCCUPATIONAL THERAPY DISCHARGE SUMMARY  Visits from Start of Care: 9  Current functional level related to goals / functional outcomes See above goals   Remaining deficits: Decreased hand strength and coordination   Education / Equipment: HEP Plan: Patient agrees to discharge.  Patient goals were met. Patient is being discharged due to meeting the stated rehab goals.  ?????     Quay Burow, OTR/L 02/27/2015, 2:02 PM  [facility] [address] [place], [place], [address] Phone: [phone]   Fax:  [phone]

## 2015-03-01 ENCOUNTER — Ambulatory Visit: Payer: Medicare PPO

## 2015-03-01 DIAGNOSIS — R471 Dysarthria and anarthria: Secondary | ICD-10-CM

## 2015-03-01 DIAGNOSIS — R262 Difficulty in walking, not elsewhere classified: Secondary | ICD-10-CM | POA: Diagnosis not present

## 2015-03-01 NOTE — Therapy (Signed)
[facility] [address] [place], [place], [address] Phone: [phone]   Fax:  [phone]  Speech Language Pathology Treatment  Patient Details  Name: Teresa Martinez MRN: [id number] Date of Birth: 05/13/1941 Referring Provider:  Gordy SaversKwiatkowski, Peter F, MD  Encounter Date: 03/01/2015      End of Session - 03/01/15 1528    Visit Number 5   Number of Visits 16   Date for SLP Re-Evaluation 04/03/15   Authorization - Visit Number 4   Authorization - Number of Visits 6   SLP Start Time 1448   SLP Stop Time  1528   SLP Time Calculation (min) 40 min   Activity Tolerance Patient tolerated treatment well      Past Medical History  Diagnosis Date  . Hyperlipemia   . Statin intolerance   . Menopausal syndrome     [name]([name])  . Hypertension   . Overactive bladder   . Cervical spine degeneration     C5/C6  . Chronic right shoulder pain   . Hemiparesis affecting dominant side as late effect of cerebrovascular accident 12/07/2014    Past Surgical History  Procedure Laterality Date  . Cholecystectomy      1950  . Band hemorrhoidectomy      2010  . Parotidectomy      30 years ago  . Abdominal hysterectomy      1985    There were no vitals filed for this visit.  Visit Diagnosis: Dysarthria      Subjective Assessment - 03/01/15 1449    Subjective "I notice if I get excited or upset I have to be more clear."               ADULT SLP TREATMENT - 03/01/15 1452    General Information   Behavior/Cognition Alert;Cooperative;Pleasant mood   Treatment Provided   Treatment provided Cognitive-Linquistic   Pain Assessment   Pain Assessment No/denies pain   Cognitive-Linquistic Treatment   Treatment focused on Dysarthria   Skilled Treatment SLP utilized skilled observation with pt outside ST room in order to minimize the effect of the therapy room on pt performance, for 25 minutes. Cont'd  conversation inside ST room. No instances of slurred speech during that time.    Assessment / Recommendations / Plan   Plan --  Discharge next visit likely   Progression Toward Goals   Progression toward goals Progressing toward goals            SLP Short Term Goals - 03/01/15 1501    SLP SHORT TERM GOAL #1   Title pt to demo independence with dysarthria HEP (overarticulation, reduced speech rate) (02-02-15)   Status Achieved   SLP SHORT TERM GOAL #2   Title pt will engage in 15 minutes mod complex conversation demonstrating independent use of compensations for dysarthria (02-02-15)   Time 3   Period Weeks   Status Achieved   SLP SHORT TERM GOAL #3   Title pt will utilize compensations for anomia in 15 minutes mod complex conversation independently (02-02-15)   Status Deferred  pt without notable anomia in mod complex/complex conversation   SLP SHORT TERM GOAL #4   Title pt will complete mod complex naming tasks with 80% success (02-02-15)   Status Deferred          SLP Long Term Goals - 03/01/15 1501    SLP LONG TERM GOAL #1   Title pt to demo compensations for dysarthria in 15  minutes mod complex/complex conversation over three sessions   Time 6   Period Weeks   Status Revised   SLP LONG TERM GOAL #2   Title pt to engage in mod complex/complex conversation for 15 minutes utilizing anomia compensations independently   Status Deferred          Plan - 03/01/15 1528    Clinical Impression Statement Pt will be discharged next week, likely. She would like to come next week to verify consistency over time.   Speech Therapy Frequency 1x /week   Duration --  5 weeks   Treatment/Interventions Cueing hierarchy;Multimodal communcation approach;Functional tasks;Patient/family education;Compensatory strategies;SLP instruction and feedback;Internal/external aids;Environmental controls   Potential to Achieve Goals Good        Problem List Patient Active Problem List    Diagnosis Date Noted  . Adhesive capsulitis of right shoulder 02/06/2015  . Dysarthria due to cerebrovascular accident 01/09/2015  . Hemiparesis affecting dominant side as late effect of cerebrovascular accident 12/07/2014  . Spastic neurogenic bladder 10/31/2014  . Right rotator cuff tendonitis 10/19/2014  . Stenosis of cervical spine region   . Chronic right shoulder pain   . Cerebral infarction due to thrombosis of left middle cerebral artery   . Left pontine stroke   . TIA (transient ischemic attack) 10/14/2014  . Hypokalemia 10/14/2014  . Overactive bladder 10/14/2014  . CVA (cerebral infarction) 10/14/2014  . URI 11/26/2010  . NECK PAIN, CHRONIC 10/29/2010  . Essential hypertension 10/10/2009  . INSOMNIA 09/15/2009  . Pure hypercholesterolemia 11/28/2008  . MENOPAUSAL SYNDROME 11/28/2008    Verdie Mosher, SLP 03/01/2015, 3:30 PM   [facility] [address] [place], [place], [address] Phone: [phone]   Fax:  [phone]

## 2015-03-02 ENCOUNTER — Ambulatory Visit: Payer: Medicare PPO | Admitting: Occupational Therapy

## 2015-03-06 ENCOUNTER — Encounter: Payer: Medicare Other | Admitting: Occupational Therapy

## 2015-03-07 ENCOUNTER — Ambulatory Visit: Payer: Medicare PPO | Admitting: Physical Medicine & Rehabilitation

## 2015-03-08 ENCOUNTER — Ambulatory Visit: Payer: Medicare PPO

## 2015-03-08 DIAGNOSIS — R471 Dysarthria and anarthria: Secondary | ICD-10-CM

## 2015-03-08 DIAGNOSIS — R262 Difficulty in walking, not elsewhere classified: Secondary | ICD-10-CM | POA: Diagnosis not present

## 2015-03-08 DIAGNOSIS — R4701 Aphasia: Secondary | ICD-10-CM

## 2015-03-08 NOTE — Therapy (Signed)
[facility] [address] [place], [place], [address] Phone: [phone]   Fax:  [phone]  Speech Language Pathology Treatment  Patient Details  Name: Teresa Martinez MRN: [id number] Date of Birth: [birth year] Referring Provider:  Marletta Lor, MD  Encounter Date: 03/08/2015      End of Session - 03/08/15 1327    Visit Number 6   Number of Visits 16   Date for SLP Re-Evaluation 04/03/15   Authorization - Visit Number 5   Authorization - Number of Visits 6   SLP Start Time 3810   SLP Stop Time  1751   SLP Time Calculation (min) 41 min   Activity Tolerance Patient tolerated treatment well      Past Medical History  Diagnosis Date  . Hyperlipemia   . Statin intolerance   . Menopausal syndrome     [name] [place])  . Hypertension   . Overactive bladder   . Cervical spine degeneration     C5/C6  . Chronic right shoulder pain   . Hemiparesis affecting dominant side as late effect of cerebrovascular accident 12/07/2014    Past Surgical History  Procedure Laterality Date  . Cholecystectomy      1950  . Band hemorrhoidectomy      2010  . Parotidectomy      30 years ago  . Abdominal hysterectomy      1985    There were no vitals filed for this visit.  Visit Diagnosis: Dysarthria  Expressive aphasia      Subjective Assessment - 03/08/15 1155    Subjective "I haven't noticed any slurring - nobody's said anything to me about it."               ADULT SLP TREATMENT - 03/08/15 1156    General Information   Behavior/Cognition Alert;Cooperative;Pleasant mood   Treatment Provided   Treatment provided Cognitive-Linquistic   Pain Assessment   Pain Assessment No/denies pain   Cognitive-Linquistic Treatment   Treatment focused on Dysarthria   Skilled Treatment SLP utilized skilled observation with pt outside [place] room in order to minimize the effect of the therapy room on pt  performance, for 40 minutes. Pt with WNL speech at this time.        Assessment / Recommendations / Plan   Plan Discharge SLP treatment due to (comment)   Progression Toward Goals   Progression toward goals Goals met, education completed, patient discharged from [facility] - 03/01/15 1501    SLP [place] #1   Title pt to demo independence with dysarthria HEP (overarticulation, reduced speech rate) (02-02-15)   Status Achieved   SLP SHORT TERM GOAL #2   Title pt will engage in 15 minutes mod complex conversation demonstrating independent use of compensations for dysarthria (02-02-15)   Time 3   Period Weeks   Status Achieved   SLP SHORT TERM GOAL #3   Title pt will utilize compensations for anomia in 15 minutes mod complex conversation independently (02-02-15)   Status Deferred  pt without notable anomia in mod complex/complex conversation   SLP SHORT TERM GOAL #4   Title pt will complete mod complex naming tasks with 80% success (02-02-15)   Status Deferred          SLP Long Term Goals - 03/08/15 1328    SLP LONG TERM GOAL #1   Title pt to demo  compensations for dysarthria in 15 minutes mod complex/complex conversation over three sessions   Time 6   Period Weeks   Status Achieved   SLP LONG TERM GOAL #2   Title pt to engage in mod complex/complex conversation for 15 minutes utilizing anomia compensations independently   Status Deferred          Plan - 04/07/2015 1330    Clinical Impression Statement Pt was very successful at maintaining clear speech over the session today. Expressive aphasia is essentially resolved. She will be discharged.   Speech Therapy Frequency 1x /week   Treatment/Interventions Cueing hierarchy;Multimodal communcation approach;Functional tasks;Patient/family education;Compensatory strategies;SLP instruction and feedback;Internal/external aids;Environmental controls   Potential to Achieve Goals Good   Consulted and  Agree with Plan of Care Patient          G-Codes - 07-Apr-2015 1331    Functional Limitations Spoken language expressive   Spoken Language Expression Goal Status [phone]) At least 1 percent but less than 20 percent impaired, limited or restricted   Spoken Language Expression Discharge Status [phone]) At least 1 percent but less than 20 percent impaired, limited or restricted     [place]  Visits from Start of Care: 5  Current functional level related to goals / functional outcomes: Pt's expressive aphasia has essentially resolved. Pt is able to speak in mod complex/complex conversation for 30-40 minutes without notable dysarthria. Pt is pleased with progress and has received compliments from family members about her speech clarity.  Remaining deficits: None    Education / Equipment: Compensations for anomia and dysarthria.  Plan: Patient agrees to discharge.  Patient goals were met. Patient is being discharged due to meeting the stated rehab goals.  ?????       Problem List Patient Active Problem List   Diagnosis Date Noted  . Adhesive capsulitis of right shoulder 02/06/2015  . Dysarthria due to cerebrovascular accident 01/09/2015  . Hemiparesis affecting dominant side as late effect of cerebrovascular accident 12/07/2014  . Spastic neurogenic bladder 10/31/2014  . Right rotator cuff tendonitis 10/19/2014  . Stenosis of cervical spine region   . Chronic right shoulder pain   . Cerebral infarction due to thrombosis of left middle cerebral artery   . Left pontine stroke   . TIA (transient ischemic attack) 10/14/2014  . Hypokalemia 10/14/2014  . Overactive bladder 10/14/2014  . CVA (cerebral infarction) 10/14/2014  . URI 11/26/2010  . NECK PAIN, CHRONIC 10/29/2010  . Essential hypertension 10/10/2009  . INSOMNIA 09/15/2009  . Pure hypercholesterolemia 11/28/2008  . MENOPAUSAL SYNDROME 11/28/2008    Garald Balding, SLP 04/07/2015, 1:32 PM  [facility] [address] [place], [place], [address] Phone: [phone]   Fax:  [phone]

## 2015-03-09 ENCOUNTER — Ambulatory Visit: Payer: Medicare PPO | Admitting: Physical Therapy

## 2015-03-09 ENCOUNTER — Encounter: Payer: Self-pay | Admitting: Physical Therapy

## 2015-03-09 ENCOUNTER — Encounter: Payer: Medicare Other | Admitting: Occupational Therapy

## 2015-03-09 DIAGNOSIS — R279 Unspecified lack of coordination: Secondary | ICD-10-CM

## 2015-03-09 DIAGNOSIS — R262 Difficulty in walking, not elsewhere classified: Secondary | ICD-10-CM

## 2015-03-09 DIAGNOSIS — M6281 Muscle weakness (generalized): Secondary | ICD-10-CM

## 2015-03-09 NOTE — Therapy (Signed)
[facility] [address] [place], [place], [address] Phone: [phone]   Fax:  [phone]  Physical Therapy Treatment  Patient Details  Name: Teresa Martinez MRN: [id number] Date of Birth: 12-03-1940 Referring Provider:  Marletta Lor, MD  Encounter Date: 03/09/2015      PT End of Session - 03/09/15 1110    Visit Number 12   Number of Visits 15   Date for PT Re-Evaluation 02/26/15   Authorization Type [employer] PPO--; G codes required   Authorization Time Period 2/5-3/21. 4 additional appt's approved as of 01/17/15 for a total 10 visits; additional 4 visits authorized for total of 15 (with eval included)   Authorization - Visit Number 11   Authorization - Number of Visits 14   PT Start Time 1106   PT Stop Time 1145   PT Time Calculation (min) 39 min   Equipment Utilized During Treatment Gait belt   Activity Tolerance Patient tolerated treatment well   Behavior During Therapy WFL for tasks assessed/performed      Past Medical History  Diagnosis Date  . Hyperlipemia   . Statin intolerance   . Menopausal syndrome     [name] [place])  . Hypertension   . Overactive bladder   . Cervical spine degeneration     C5/C6  . Chronic right shoulder pain   . Hemiparesis affecting dominant side as late effect of cerebrovascular accident 12/07/2014    Past Surgical History  Procedure Laterality Date  . Cholecystectomy      1950  . Band hemorrhoidectomy      2010  . Parotidectomy      30 years ago  . Abdominal hysterectomy      1985    There were no vitals filed for this visit.  Visit Diagnosis:  Difficulty walking  Lack of coordination  Generalized muscle weakness      Subjective Assessment - 03/09/15 1108    Subjective No nw compaints. One fall while making up bed, tripped over comforter. Landed on right hip. Reports it was a short distance she fell to floor. Denies any injury or  pain currently.    Currently in Pain? No/denies   Pain Score 0-No pain            [facility] Adult PT Treatment/Exercise - 03/09/15 1123    Ambulation/Gait   Ambulation/Gait Yes   Ambulation/Gait Assistance 5: Supervision;4: Min guard   Ambulation/Gait Assistance Details cues on posture and equal step length with bil legs   Ambulation Distance (Feet) --  > 1000   Assistive device None   Gait Pattern Decreased step length - right;Decreased dorsiflexion - right;Decreased hip/knee flexion - right   Ambulation Surface Level;Unlevel;Indoor;Outdoor;Paved;Gravel;Grass     Neuro Re-ed On grass outside: no AD - high knee marches forward/backward along ~20 foot pathway x 3 laps each - tandem walking forward/backward along ~20 foot pathway x 3 laps each - up/down steep grassy hill x 3 reps  At stairs using bottom 3 steps - toe taps up/down to each step with emphasis on hip/knee flexion 2 sets of 10 reps each leg.  In parallel bars red mat with scattered stepping stones and foam bubbles underneath it. Min assist to min guard assist with UE support at times on parallel bars - side stepping x 2 laps each  - cross overs (forward and backward) x 2 laps each/each way - [place] x 2 laps each way - tandem x 2 laps each way  At carpet/tile line - modifed rhomberg stance, quick switching of feet across line x 10 reps - forward/backward line jumps with emphasis on equal foot movements x 10 reps - lateral line jumps x 10 reps.        PT Short Term Goals - 01/09/15 1612    PT SHORT TERM GOAL #1   Title Pt will demonstrate correct performance of HEP to address muscle weakness, impaired balance and coordination. Target: 01/13/15   Status Achieved   PT SHORT TERM GOAL #2   Title Pt will increase score on Functional Gait Assessment to 21/30 for improving balance. Target: 01/13/15   Baseline on 2/22- 20/30   Status Not Met   PT SHORT TERM GOAL #3   Title Pt will increase gait speed without  assistive device to 2.8 ft/sec for community ambulator status. Target: 01/13/15   Baseline on 2/22- 2.72 ft/sec without AD   Status Not Met   PT SHORT TERM GOAL #4   Title Pt will ambulate on even, indoor surfaces independently x500' without loss of balance. Target: 01/13/15   Status Achieved           PT Long Term Goals - 02/20/15 1405    PT LONG TERM GOAL #1   Title Pt will verbalize understanding of CVA warning signs and risk factors. Target: 02/13/15   Status Achieved   PT LONG TERM GOAL #2   Title Pt will increase score on Functional Gait Assessment to 25/30 for decreased fall risk. Target: 02/13/15   Status Not Met  scored 23/30   PT LONG TERM GOAL #3   Title Pt will negotiate stairs modified independent with single hand rail and step over step pattern for improved efficiency and safety with home access. Target: 02/13/15   Status Achieved   PT LONG TERM GOAL #4   Title Pt will power walk on treadmill independently x10 minutes to demonstrate increased endurance and  progress towards preparing for return to sport. Target: 02/13/15   Status Achieved  Doing this for 30 minutes per day, at 2.5 MPH and progressing for a few minutes at 3.0 MPH   PT LONG TERM GOAL #5   Title Pt will ambulate on outdoor, uneven grass and pinstraw and negotiate curbs and ramps independently to demonstrate independent community ambulator status. Target: 02/13/15   Status Not Met  Pt requires supervision on pinestraw and occasionally on uneven grass due to RLE lag   PT LONG TERM GOAL #6   Title Pt will increase stroke impact scale to 88% for improved function and participation in life post CVA. Target: 02/13/15   Status Achieved  94.4% on 02/20/2015           Plan - 03/09/15 1111    Clinical Impression Statement Focused on high level balance and coordination activities today without issues. Pt progressing toward goals.   Pt will benefit from skilled therapeutic intervention in order to improve on the  following deficits Abnormal gait;Decreased coordination;Decreased endurance;Decreased strength;Difficulty walking;Decreased balance   Rehab Potential Good   PT Frequency 1x / week   PT Duration --  pt approved for 4 visits, 1x week for 4 weeks   PT Treatment/Interventions ADLs/Self Care Home Management;Therapeutic activities;Patient/family education;DME Instruction;Therapeutic exercise;Gait training;Balance training;Manual techniques;Stair training;Neuromuscular re-education   PT Next Visit Plan continue toward updated goals   Consulted and Agree with Plan of Care Patient        Problem List Patient Active Problem List   Diagnosis Date Noted  .  Adhesive capsulitis of right shoulder 02/06/2015  . Dysarthria due to cerebrovascular accident 01/09/2015  . Hemiparesis affecting dominant side as late effect of cerebrovascular accident 12/07/2014  . Spastic neurogenic bladder 10/31/2014  . Right rotator cuff tendonitis 10/19/2014  . Stenosis of cervical spine region   . Chronic right shoulder pain   . Cerebral infarction due to thrombosis of left middle cerebral artery   . Left pontine stroke   . TIA (transient ischemic attack) 10/14/2014  . Hypokalemia 10/14/2014  . Overactive bladder 10/14/2014  . CVA (cerebral infarction) 10/14/2014  . URI 11/26/2010  . NECK PAIN, CHRONIC 10/29/2010  . Essential hypertension 10/10/2009  . INSOMNIA 09/15/2009  . Pure hypercholesterolemia 11/28/2008  . MENOPAUSAL SYNDROME 11/28/2008    Willow Ora 03/10/2015, 12:20 PM  Willow Ora, PTA, [facility] [address] [place], [place] [address] [phone] 03/10/2015, 12:20 PM

## 2015-03-14 ENCOUNTER — Encounter: Payer: Self-pay | Admitting: Physical Medicine & Rehabilitation

## 2015-03-14 ENCOUNTER — Ambulatory Visit (HOSPITAL_BASED_OUTPATIENT_CLINIC_OR_DEPARTMENT_OTHER): Payer: Medicare PPO | Admitting: Physical Medicine & Rehabilitation

## 2015-03-14 ENCOUNTER — Encounter: Payer: Medicare PPO | Attending: Physical Medicine & Rehabilitation

## 2015-03-14 VITALS — BP 150/80 | HR 74 | Resp 14

## 2015-03-14 DIAGNOSIS — R200 Anesthesia of skin: Secondary | ICD-10-CM

## 2015-03-14 DIAGNOSIS — R208 Other disturbances of skin sensation: Secondary | ICD-10-CM | POA: Diagnosis not present

## 2015-03-14 DIAGNOSIS — G8111 Spastic hemiplegia affecting right dominant side: Secondary | ICD-10-CM | POA: Insufficient documentation

## 2015-03-14 DIAGNOSIS — R279 Unspecified lack of coordination: Secondary | ICD-10-CM | POA: Insufficient documentation

## 2015-03-14 DIAGNOSIS — R262 Difficulty in walking, not elsewhere classified: Secondary | ICD-10-CM | POA: Diagnosis not present

## 2015-03-14 NOTE — Patient Instructions (Signed)
You have carpal tunnel syndrome Buy a left wrist splint and wear it at night every night for a month. If it is not better in 4 weeks we will do a carpal tunnel injection with cortisone  I am not sure if the left arm pit fibrous structure is a tight tendon or if it may be a blood vessel. I recommend you see your primary care physician on this

## 2015-03-15 ENCOUNTER — Ambulatory Visit: Payer: Medicare PPO | Admitting: Physical Therapy

## 2015-03-15 ENCOUNTER — Ambulatory Visit (INDEPENDENT_AMBULATORY_CARE_PROVIDER_SITE_OTHER): Payer: Medicare PPO | Admitting: Family Medicine

## 2015-03-15 ENCOUNTER — Ambulatory Visit: Payer: Medicare PPO

## 2015-03-15 ENCOUNTER — Encounter: Payer: Self-pay | Admitting: Family Medicine

## 2015-03-15 VITALS — BP 128/80 | HR 78 | Temp 98.3°F | Wt 143.0 lb

## 2015-03-15 DIAGNOSIS — M7989 Other specified soft tissue disorders: Secondary | ICD-10-CM | POA: Diagnosis not present

## 2015-03-15 DIAGNOSIS — M6281 Muscle weakness (generalized): Secondary | ICD-10-CM

## 2015-03-15 DIAGNOSIS — R279 Unspecified lack of coordination: Secondary | ICD-10-CM

## 2015-03-15 DIAGNOSIS — R262 Difficulty in walking, not elsewhere classified: Secondary | ICD-10-CM | POA: Diagnosis not present

## 2015-03-15 NOTE — Progress Notes (Signed)
   Subjective:    Patient ID: Teresa Martinez, female    DOB: 07/27/1941, 74 y.o.   MRN: [id number]  HPI Patient seen with swelling under her left axillary region. She first noticed some swelling on Sunday. She went to her rehabilitation specialist yesterday and they suggested follow-up here. No associated pain. No warmth to touch. No rashes. She has not noted any edema involving her left upper extremity or any color changes. No dyspnea. No pleuritic pain. No chest pains.  No pain with movement of LUE.  Her chronic problems include history of stroke, hyperlipidemia, hypertension.  On aspirin one daily 325 mg.  Past Medical History  Diagnosis Date  . Hyperlipemia   . Statin intolerance   . Menopausal syndrome     (Sherry Dickstein)  . Hypertension   . Overactive bladder   . Cervical spine degeneration     C5/C6  . Chronic right shoulder pain   . Hemiparesis affecting dominant side as late effect of cerebrovascular accident 12/07/2014   Past Surgical History  Procedure Laterality Date  . Cholecystectomy      1950  . Band hemorrhoidectomy      2010  . Parotidectomy      30 years ago  . Abdominal hysterectomy      19 85    reports that she has never smoked. She has never used smokeless tobacco. She reports that she does not drink alcohol or use illicit drugs. family history includes Cirrhosis in her sister; Diabetes in her brother and mother; Heart failure in her father; Hyperlipidemia in her brother; Hypertension in her brother and mother; Stroke in her brother and mother. Allergies  Allergen Reactions  . Latex Other (See Comments)    Burns her skin  . Pravastatin Other (See Comments)    Leg pains      Review of Systems  Constitutional: Negative for fever, chills and unexpected weight change.  Respiratory: Negative for shortness of breath.   Cardiovascular: Negative for chest pain.  Skin: Negative for rash.  Hematological: Negative for adenopathy.         Objective:   Physical Exam  Neck: Neck supple.  Cardiovascular: Normal rate and regular rhythm.   Pulmonary/Chest: Effort normal and breath sounds normal. No respiratory distress. She has no wheezes. She has no rales.  Lymphadenopathy:    She has no cervical adenopathy.  Skin:  Left axilla reveals no erythema or warmth. No adenopathy. She has elongated fibrous structure which is nontender which is fairly superficial under the skin. No evidence for left upper extremity edema. Good distal pulses. No skin color changes          Assessment & Plan:  Firmly palpated elongated superficial structure left axillary region spreading to the arm. Question venous-superficial phlebitis. Clinically, fairly low suspicion for DVT.  Set up venous Doppler to further assess.

## 2015-03-15 NOTE — Progress Notes (Signed)
Pre visit review using our clinic review tool, if applicable. No additional management support is needed unless otherwise documented below in the visit note. 

## 2015-03-15 NOTE — Therapy (Signed)
[facility] [address] [place], [place], [address] Phone: [phone]   Fax:  [phone]  Physical Therapy Treatment  Patient Details  Name: Teresa Martinez MRN: [id number] Date of Birth: 18-Jan-1941 Referring Provider:  Marletta Lor, MD  Encounter Date: 03/15/2015      PT End of Session - 03/15/15 1628    Visit Number 13   Number of Visits 15   Date for PT Re-Evaluation 02/26/15   Authorization Type [employer] PPO--; G codes required   Authorization Time Period 2/5-3/21. 4 additional appt's approved as of 01/17/15 for a total 10 visits; additional 4 visits authorized for total of 15 (with eval included)   Authorization - Visit Number 12   Authorization - Number of Visits 14   PT Start Time 0272   PT Stop Time 1438   PT Time Calculation (min) 41 min   Equipment Utilized During Treatment Gait belt   Activity Tolerance Patient tolerated treatment well   Behavior During Therapy [facility] for tasks assessed/performed      Past Medical History  Diagnosis Date  . Hyperlipemia   . Statin intolerance   . Menopausal syndrome     [name] [place])  . Hypertension   . Overactive bladder   . Cervical spine degeneration     C5/C6  . Chronic right shoulder pain   . Hemiparesis affecting dominant side as late effect of cerebrovascular accident 12/07/2014    Past Surgical History  Procedure Laterality Date  . Cholecystectomy      1950  . Band hemorrhoidectomy      2010  . Parotidectomy      30 years ago  . Abdominal hysterectomy      1985    There were no vitals filed for this visit.  Visit Diagnosis:  Lack of coordination  Difficulty walking  Generalized muscle weakness      Subjective Assessment - 03/15/15 1400    Subjective Pt denied falls or changes since last visit.    Pertinent History R rotator cuff tendonitis,    Patient Stated Goals to get back into running   Currently in Pain?  No/denies                         [facility] Adult PT Treatment/Exercise - 03/15/15 1440    Ambulation/Gait   Ambulation/Gait Yes   Ambulation/Gait Assistance 5: Supervision   Ambulation/Gait Assistance Details Pt ambulated 0.29 miles, at 3.83mh, on treadmill for 6 minutes at elevation 1% for 3 minutes and 0% for 3 minutes, with B UE support. VC's to improve R dorsiflexion due to fatigue. Pt required 2 minute seated rest break after amb. due to fatigue.   Ambulation Distance (Feet) --  0.29 miles   Assistive device None   Gait Pattern Decreased step length - right;Decreased dorsiflexion - right;Decreased hip/knee flexion - right   Ambulation Surface Level;Indoor   High Level Balance   High Level Balance Activities Braiding;Backward walking;Head turns;Marching forwards  amb. forward with eyes closed, marching with head turns   High Level Balance Comments On blue mat with no UE support with min guard to min  A (during 3 LOB episodes during marching with head turns). Each activity performed 4x7'. Pt required one seated rest break after for water break. VC's to improve, lateral weight shifting, R heel strike and to take even steps. Pt also performed B LE toe taps on 6" cones x10/LE over grassy terrain,  and negotiated hurdles and cones obstacles 4x20' outdoors over grassy terrain with supervision to min guard. Pt required two seated rest breaks due to fatigue.        Therex: -Standing heel raises for power with UE support at counter: 29 performed in 30 seconds. VC's for technique. -Calf raises: 80 pounds on leg press, 3x10. VC's to improve eccentric control. -Standing B gastroc stretch with UE support on wall: 2x30sec. Hold.          PT Short Term Goals - 01/09/15 1612    PT SHORT TERM GOAL #1   Title Pt will demonstrate correct performance of HEP to address muscle weakness, impaired balance and coordination. Target: 01/13/15   Status Achieved   PT SHORT TERM GOAL #2    Title Pt will increase score on Functional Gait Assessment to 21/30 for improving balance. Target: 01/13/15   Baseline on 2/22- 20/30   Status Not Met   PT SHORT TERM GOAL #3   Title Pt will increase gait speed without assistive device to 2.8 ft/sec for community ambulator status. Target: 01/13/15   Baseline on 2/22- 2.72 ft/sec without AD   Status Not Met   PT SHORT TERM GOAL #4   Title Pt will ambulate on even, indoor surfaces independently x500' without loss of balance. Target: 01/13/15   Status Achieved           PT Long Term Goals - 02/20/15 1405    PT LONG TERM GOAL #1   Title Pt will verbalize understanding of CVA warning signs and risk factors. Target: 02/13/15   Status Achieved   PT LONG TERM GOAL #2   Title Pt will increase score on Functional Gait Assessment to 25/30 for decreased fall risk. Target: 02/13/15   Status Not Met  scored 23/30   PT LONG TERM GOAL #3   Title Pt will negotiate stairs modified independent with single hand rail and step over step pattern for improved efficiency and safety with home access. Target: 02/13/15   Status Achieved   PT LONG TERM GOAL #4   Title Pt will power walk on treadmill independently x10 minutes to demonstrate increased endurance and  progress towards preparing for return to sport. Target: 02/13/15   Status Achieved  Doing this for 30 minutes per day, at 2.5 MPH and progressing for a few minutes at 3.0 MPH   PT LONG TERM GOAL #5   Title Pt will ambulate on outdoor, uneven grass and pinstraw and negotiate curbs and ramps independently to demonstrate independent community ambulator status. Target: 02/13/15   Status Not Met  Pt requires supervision on pinestraw and occasionally on uneven grass due to RLE lag   PT LONG TERM GOAL #6   Title Pt will increase stroke impact scale to 88% for improved function and participation in life post CVA. Target: 02/13/15   Status Achieved  94.4% on 02/20/2015               Plan - 03/15/15 1629     Clinical Impression Statement Pt demonstrated progress as she was able to tolerate ambulating on treadmill at a higher speed (3.96mh vs. 2.872m). Pt continues to experience LOB and increased postural sway during high level balance activities on compliant surfaces. Continue with POC.   Pt will benefit from skilled therapeutic intervention in order to improve on the following deficits Abnormal gait;Decreased coordination;Decreased endurance;Decreased strength;Difficulty walking;Decreased balance   Rehab Potential Good   PT Frequency 1x / week   PT Duration --  approved for 4 weeks, 1x/week for 4 weeks   PT Treatment/Interventions ADLs/Self Care Home Management;Therapeutic activities;Patient/family education;DME Instruction;Therapeutic exercise;Gait training;Balance training;Manual techniques;Stair training;Neuromuscular re-education   PT Next Visit Plan Try light jogging on treadmill, continue high level balance activities on compliant surfaces   Consulted and Agree with Plan of Care Patient        Problem List Patient Active Problem List   Diagnosis Date Noted  . Adhesive capsulitis of right shoulder 02/06/2015  . Dysarthria due to cerebrovascular accident 01/09/2015  . Hemiparesis affecting dominant side as late effect of cerebrovascular accident 12/07/2014  . Spastic neurogenic bladder 10/31/2014  . Right rotator cuff tendonitis 10/19/2014  . Stenosis of cervical spine region   . Chronic right shoulder pain   . Cerebral infarction due to thrombosis of left middle cerebral artery   . Left pontine stroke   . TIA (transient ischemic attack) 10/14/2014  . Hypokalemia 10/14/2014  . Overactive bladder 10/14/2014  . CVA (cerebral infarction) 10/14/2014  . URI 11/26/2010  . NECK PAIN, CHRONIC 10/29/2010  . Essential hypertension 10/10/2009  . INSOMNIA 09/15/2009  . Pure hypercholesterolemia 11/28/2008  . MENOPAUSAL SYNDROME 11/28/2008    Tyquon Near L 03/15/2015, 4:32 PM  [facility] [address] [place], [place], [address] Phone: [phone]   Fax:  [phone]    Geoffry [facility], PT,DPT 03/15/2015 4:32 PM Phone: [phone] Fax: [phone]

## 2015-03-15 NOTE — Patient Instructions (Signed)
We will call you regarding venous doppler of left upper extremity.

## 2015-03-16 ENCOUNTER — Ambulatory Visit: Payer: Medicare Other | Admitting: Internal Medicine

## 2015-03-16 ENCOUNTER — Telehealth: Payer: Self-pay | Admitting: Internal Medicine

## 2015-03-16 NOTE — Telephone Encounter (Signed)
Error

## 2015-03-17 ENCOUNTER — Encounter (HOSPITAL_COMMUNITY): Payer: Medicare PPO

## 2015-03-17 ENCOUNTER — Ambulatory Visit (HOSPITAL_COMMUNITY): Payer: Medicare PPO | Attending: Cardiovascular Disease | Admitting: *Deleted

## 2015-03-17 DIAGNOSIS — M7989 Other specified soft tissue disorders: Secondary | ICD-10-CM | POA: Diagnosis not present

## 2015-03-17 NOTE — Progress Notes (Signed)
Unilateral Upper Extremity Venous Duplex Exam - Left - Performed - Negative for DVT

## 2015-03-22 ENCOUNTER — Ambulatory Visit: Payer: Medicare PPO | Attending: Physical Medicine & Rehabilitation

## 2015-03-22 ENCOUNTER — Telehealth: Payer: Self-pay

## 2015-03-22 DIAGNOSIS — R262 Difficulty in walking, not elsewhere classified: Secondary | ICD-10-CM | POA: Diagnosis not present

## 2015-03-22 DIAGNOSIS — R279 Unspecified lack of coordination: Secondary | ICD-10-CM | POA: Diagnosis not present

## 2015-03-22 NOTE — Telephone Encounter (Signed)
Doppler no DVT.

## 2015-03-22 NOTE — Telephone Encounter (Signed)
Lab results are not read.

## 2015-03-22 NOTE — Therapy (Signed)
[facility] [address] [place], [place], [address] Phone: [phone]   Fax:  [phone]  Physical Therapy Treatment  Patient Details  Name: Teresa Martinez MRN: [id number] Date of Birth: 1941/10/08 Referring Provider:  Marletta Lor, MD  Encounter Date: 03/22/2015      PT End of Session - 03/22/15 1510    Visit Number 14   Number of Visits 15   Authorization Type [employer] PPO--; G codes required   Authorization - Visit Number 13   Authorization - Number of Visits 14   PT Start Time 6811   PT Stop Time 1400   PT Time Calculation (min) 43 min      Past Medical History  Diagnosis Date  . Hyperlipemia   . Statin intolerance   . Menopausal syndrome     Teresa Martinez)  . Hypertension   . Overactive bladder   . Cervical spine degeneration     C5/C6  . Chronic right shoulder pain   . Hemiparesis affecting dominant side as late effect of cerebrovascular accident 12/07/2014    Past Surgical History  Procedure Laterality Date  . Cholecystectomy      1950  . Band hemorrhoidectomy      2010  . Parotidectomy      30 years ago  . Abdominal hysterectomy      1985    There were no vitals filed for this visit.  Visit Diagnosis:  Lack of coordination  Difficulty walking      Subjective Assessment - 03/22/15 1321    Subjective (p) Pt reports that the LUE has a possible swollen tendon. She has seen a doctor about it and a blood clot was already ruled out. Unsure of what the swelling is coming from. No pain though.   Currently in Pain? (p) No/denies       Gait training throughout session -on indoor level surface with R knee noted to have decreased extension in heel strike and to have decreased timing of preswing. Also tends to spend less time in RLE single limb stance.  -retro walking 8x30' without UE support with emphasis on RLE knee flexion and hip extension, and on proper  sequence of posterior weight shift.  -repeated practice of RLE heel tap onto a 6" box, then toe tap behind her to practice swing phase and initial contact with knee extension and heel strike. This was progressed by removing the box and performing a partial weight shift onto the RLE, then progressed further by performing a full weight shift onto the RLE with knee extension with verbal and tactile cues for correct form.  -then gait training on level surface again without assistive device and with supervision for verbal cues for form. Demonstrated improved knee extension in stance. Noted decreased pelvic mobility. -pelvic mobility on physioball to improve pelvic mobility in gait pattern: 3x10 anterior/posterior, and 3x10 lateral, and 30x small circles with verbal cues to inhibit upper trunk mobility and isolate pelvic mobility. -jogging on treadmill progressing from 1.0 MPH to 2.5 MPH with BUE support with occasional R foot drag but no loss of balance. After resting, practiced jogging next to counter, then jogging x2 bouts of 200' on indoor level surface with supervision without foot drag with good form.  -at end of session pt asked to be taught a quad stretch for RLE. Pt was taught to use one chair to support her right knee behind her while holding onto a nother chair for balance. Pt  instructed only to perform this with supervision initially.                            PT Short Term Goals - 01/09/15 1612    PT SHORT TERM GOAL #1   Title Pt will demonstrate correct performance of HEP to address muscle weakness, impaired balance and coordination. Target: 01/13/15   Status Achieved   PT SHORT TERM GOAL #2   Title Pt will increase score on Functional Gait Assessment to 21/30 for improving balance. Target: 01/13/15   Baseline on 2/22- 20/30   Status Not Met   PT SHORT TERM GOAL #3   Title Pt will increase gait speed without assistive device to 2.8 ft/sec for community ambulator status.  Target: 01/13/15   Baseline on 2/22- 2.72 ft/sec without AD   Status Not Met   PT SHORT TERM GOAL #4   Title Pt will ambulate on even, indoor surfaces independently x500' without loss of balance. Target: 01/13/15   Status Achieved           PT Long Term Goals - 02/20/15 1405    PT LONG TERM GOAL #1   Title Pt will verbalize understanding of CVA warning signs and risk factors. Target: 02/13/15   Status Achieved   PT LONG TERM GOAL #2   Title Pt will increase score on Functional Gait Assessment to 25/30 for decreased fall risk. Target: 02/13/15   Status Not Met  scored 23/30   PT LONG TERM GOAL #3   Title Pt will negotiate stairs modified independent with single hand rail and step over step pattern for improved efficiency and safety with home access. Target: 02/13/15   Status Achieved   PT LONG TERM GOAL #4   Title Pt will power walk on treadmill independently x10 minutes to demonstrate increased endurance and  progress towards preparing for return to sport. Target: 02/13/15   Status Achieved  Doing this for 30 minutes per day, at 2.5 MPH and progressing for a few minutes at 3.0 MPH   PT LONG TERM GOAL #5   Title Pt will ambulate on outdoor, uneven grass and pinstraw and negotiate curbs and ramps independently to demonstrate independent community ambulator status. Target: 02/13/15   Status Not Met  Pt requires supervision on pinestraw and occasionally on uneven grass due to RLE lag   PT LONG TERM GOAL #6   Title Pt will increase stroke impact scale to 88% for improved function and participation in life post CVA. Target: 02/13/15   Status Achieved  94.4% on 02/20/2015               Plan - 03/22/15 1512    Clinical Impression Statement Pt demonstrated remarkable progress today. Able to initiate jogging on treadmill at 2.5 mph; then jogging on level indoor surface for brief episodes of 200' at a time. No loss of balance with this today. Occasional R foot drag noted. Plan to d/c next  visit.   PT Next Visit Plan check previously unmet goals and d/c   Consulted and Agree with Plan of Care Patient        Problem List Patient Active Problem List   Diagnosis Date Noted  . Adhesive capsulitis of right shoulder 02/06/2015  . Dysarthria due to cerebrovascular accident 01/09/2015  . Hemiparesis affecting dominant side as late effect of cerebrovascular accident 12/07/2014  . Spastic neurogenic bladder 10/31/2014  . Right rotator cuff tendonitis 10/19/2014  .  Stenosis of cervical spine region   . Chronic right shoulder pain   . Cerebral infarction due to thrombosis of left middle cerebral artery   . Left pontine stroke   . TIA (transient ischemic attack) 10/14/2014  . Hypokalemia 10/14/2014  . Overactive bladder 10/14/2014  . CVA (cerebral infarction) 10/14/2014  . URI 11/26/2010  . NECK PAIN, CHRONIC 10/29/2010  . Essential hypertension 10/10/2009  . INSOMNIA 09/15/2009  . Pure hypercholesterolemia 11/28/2008  . MENOPAUSAL SYNDROME 11/28/2008    Delrae Sawyers, PT,DPT,NCS 03/22/2015 3:23 PM Phone [phone] FAX [phone]         [facility] [address] [place], [place], [address] Phone: [phone]   Fax:  [phone]

## 2015-03-23 NOTE — Telephone Encounter (Signed)
Pt informed

## 2015-03-24 ENCOUNTER — Telehealth: Payer: Self-pay | Admitting: Internal Medicine

## 2015-03-24 NOTE — Telephone Encounter (Signed)
Pt scheduled  

## 2015-03-24 NOTE — Telephone Encounter (Signed)
Pt would like to see Dr Kirtland BouchardK may I use SDA FOR 03/31/15 AT 2:30

## 2015-03-24 NOTE — Telephone Encounter (Signed)
Yes, that is fine. 

## 2015-03-29 ENCOUNTER — Ambulatory Visit: Payer: Medicare PPO

## 2015-03-29 DIAGNOSIS — R262 Difficulty in walking, not elsewhere classified: Secondary | ICD-10-CM | POA: Diagnosis not present

## 2015-03-29 DIAGNOSIS — R279 Unspecified lack of coordination: Secondary | ICD-10-CM

## 2015-03-29 NOTE — Therapy (Signed)
[facility] [address] [place], [place], [address] Phone: [phone]   Fax:  [phone]  Physical Therapy Treatment  Patient Details  Name: Teresa Martinez MRN: [id number] Date of Birth: 1941-09-09 Referring Provider:  Marletta Lor, MD  Encounter Date: 03/29/2015      PT End of Session - 03/29/15 1344    Visit Number 15   Number of Visits 15   Date for PT Re-Evaluation 02/26/15   Authorization Type [employer] PPO--; G codes required   Authorization Time Period 2/5-3/21. 4 additional appt's approved as of 01/17/15 for a total 10 visits; additional 4 visits authorized for total of 15 (with eval included)   Authorization - Visit Number 15   Authorization - Number of Visits 15   PT Start Time 6962   PT Stop Time 1344   PT Time Calculation (min) 27 min      Past Medical History  Diagnosis Date  . Hyperlipemia   . Statin intolerance   . Menopausal syndrome     [name] [place])  . Hypertension   . Overactive bladder   . Cervical spine degeneration     C5/C6  . Chronic right shoulder pain   . Hemiparesis affecting dominant side as late effect of cerebrovascular accident 12/07/2014    Past Surgical History  Procedure Laterality Date  . Cholecystectomy      1950  . Band hemorrhoidectomy      2010  . Parotidectomy      30 years ago  . Abdominal hysterectomy      1985    There were no vitals filed for this visit.  Visit Diagnosis:  Lack of coordination  Difficulty walking      Subjective Assessment - 03/29/15 1319    Subjective Pt reports she has jogged in small space at home since she was last here. She is taking her time to get back into jogging.   Currently in Pain? No/denies            [facility] PT Assessment - 03/29/15 0001    Functional Gait  Assessment   Gait assessed  Yes   Gait Level Surface Walks 20 ft in less than 7 sec but greater than 5.5 sec, uses assistive  device, slower speed, mild gait deviations, or deviates 6-10 in outside of the 12 in walkway width.   Change in Gait Speed Able to smoothly change walking speed without loss of balance or gait deviation. Deviate no more than 6 in outside of the 12 in walkway width.   Gait with Horizontal Head Turns Performs head turns smoothly with slight change in gait velocity (eg, minor disruption to smooth gait path), deviates 6-10 in outside 12 in walkway width, or uses an assistive device.   Gait with Vertical Head Turns Performs task with slight change in gait velocity (eg, minor disruption to smooth gait path), deviates 6 - 10 in outside 12 in walkway width or uses assistive device   Gait and Pivot Turn Pivot turns safely within 3 sec and stops quickly with no loss of balance.   Step Over Obstacle Is able to step over 2 stacked shoe boxes taped together (9 in total height) without changing gait speed. No evidence of imbalance.   Gait with Narrow Base of Support Is able to ambulate for 10 steps heel to toe with no staggering.   Gait with Eyes Closed Walks 20 ft, uses assistive device, slower speed, mild gait deviations, deviates  6-10 in outside 12 in walkway width. Ambulates 20 ft in less than 9 sec but greater than 7 sec.   Ambulating Backwards Walks 20 ft, uses assistive device, slower speed, mild gait deviations, deviates 6-10 in outside 12 in walkway width.   Steps Alternating feet, no rail.   Total Score 25            Gait training >1000' outdoor on grass, uneven concrete, pine straw etc independently Completed functional gait index and scored 25/30  Discussed pt's progress with gait pattern.  <3 minutes reviewing physioball exercise for pelvic mobility with good demonstration by pt.                   PT Short Term Goals - 01/09/15 1612    PT SHORT TERM GOAL #1   Title Pt will demonstrate correct performance of HEP to address muscle weakness, impaired balance and coordination.  Target: 01/13/15   Status Achieved   PT SHORT TERM GOAL #2   Title Pt will increase score on Functional Gait Assessment to 21/30 for improving balance. Target: 01/13/15   Baseline on 2/22- 20/30   Status Not Met   PT SHORT TERM GOAL #3   Title Pt will increase gait speed without assistive device to 2.8 ft/sec for community ambulator status. Target: 01/13/15   Baseline on 2/22- 2.72 ft/sec without AD   Status Not Met   PT SHORT TERM GOAL #4   Title Pt will ambulate on even, indoor surfaces independently x500' without loss of balance. Target: 01/13/15   Status Achieved           PT Long Term Goals - 03/29/15 1320    PT LONG TERM GOAL #1   Title Pt will verbalize understanding of CVA warning signs and risk factors. Target: 02/13/15   Status Achieved   PT LONG TERM GOAL #2   Title Pt will increase score on Functional Gait Assessment to 25/30 for decreased fall risk. Target: 02/13/15   Status Achieved  Scored 25/30 on 03/29/15   PT LONG TERM GOAL #3   Title Pt will negotiate stairs modified independent with single hand rail and step over step pattern for improved efficiency and safety with home access. Target: 02/13/15   Status Achieved   PT LONG TERM GOAL #4   Title Pt will power walk on treadmill independently x10 minutes to demonstrate increased endurance and  progress towards preparing for return to sport. Target: 02/13/15   Status Achieved  Doing this for 30 minutes per day, at 2.5 MPH and progressing for a few minutes at 3.0 MPH   PT LONG TERM GOAL #5   Title Pt will ambulate on outdoor, uneven grass and pinstraw and negotiate curbs and ramps independently to demonstrate independent community ambulator status. Target: 02/13/15   Status Achieved  Achieved on 03/29/15   PT LONG TERM GOAL #6   Title Pt will increase stroke impact scale to 88% for improved function and participation in life post CVA. Target: 02/13/15   Status Achieved  94.4% on 02/20/2015               Plan -  03/29/15 1344    Clinical Impression Statement Pt met all remaining (previously un met) therapy goals today. She is jogging very short, level surfaces at home at this time and has good safety awarenss to progress her jogging independently. Pt has only minor remaining gait impairments but is safe for independent ambulation on all reasonalble outdoor surfaces (  grass, pinestraw, uneven concrete etc). D/C today          G-Codes - 2015-04-20 1347    Functional Assessment Tool Used FGA 25/30   Functional Limitation Mobility: Walking and moving around   Mobility: Walking and Moving Around Current Status [phone]) At least 1 percent but less than 20 percent impaired, limited or restricted   Mobility: Walking and Moving Around Goal Status [phone]) At least 1 percent but less than 20 percent impaired, limited or restricted   Mobility: Walking and Moving Around Discharge Status [phone]) At least 1 percent but less than 20 percent impaired, limited or restricted  Completed current/goal and d/c status due to previously completing D/C status too early. Pt was expected to d/c but was authorized for more visits from [facility]. Today is her true d/c date.      Problem List Patient Active Problem List   Diagnosis Date Noted  . Adhesive capsulitis of right shoulder 02/06/2015  . Dysarthria due to cerebrovascular accident 01/09/2015  . Hemiparesis affecting dominant side as late effect of cerebrovascular accident 12/07/2014  . Spastic neurogenic bladder 10/31/2014  . Right rotator cuff tendonitis 10/19/2014  . Stenosis of cervical spine region   . Chronic right shoulder pain   . Cerebral infarction due to thrombosis of left middle cerebral artery   . Left pontine stroke   . TIA (transient ischemic attack) 10/14/2014  . Hypokalemia 10/14/2014  . Overactive bladder 10/14/2014  . CVA (cerebral infarction) 10/14/2014  . URI 11/26/2010  . NECK PAIN, CHRONIC 10/29/2010  . Essential hypertension 10/10/2009  .  INSOMNIA 09/15/2009  . Pure hypercholesterolemia 11/28/2008  . MENOPAUSAL SYNDROME 11/28/2008   Delrae Sawyers, PT,DPT,NCS April 20, 2015 1:57 PM Phone [phone] FAX [phone]         [facility] [address] [place], [place], [address] Phone: [phone]   Fax:  [phone]    PHYSICAL THERAPY DISCHARGE SUMMARY  Visits from Start of Care: 15  Current functional level related to goals / functional outcomes: Pt met all goals. See below:     PT Long Term Goals - 2015-04-20 1320    PT LONG TERM GOAL #1   Title Pt will verbalize understanding of CVA warning signs and risk factors. Target: 02/13/15   Status Achieved   PT LONG TERM GOAL #2   Title Pt will increase score on Functional Gait Assessment to 25/30 for decreased fall risk. Target: 02/13/15   Status Achieved  Scored 25/30 on 04/20/15   PT LONG TERM GOAL #3   Title Pt will negotiate stairs modified independent with single hand rail and step over step pattern for improved efficiency and safety with home access. Target: 02/13/15   Status Achieved   PT LONG TERM GOAL #4   Title Pt will power walk on treadmill independently x10 minutes to demonstrate increased endurance and  progress towards preparing for return to sport. Target: 02/13/15   Status Achieved  Doing this for 30 minutes per day, at 2.5 MPH and progressing for a few minutes at 3.0 MPH   PT LONG TERM GOAL #5   Title Pt will ambulate on outdoor, uneven grass and pinstraw and negotiate curbs and ramps independently to demonstrate independent community ambulator status. Target: 02/13/15   Status Achieved  Achieved on 2015/04/20   PT LONG TERM GOAL #6   Title Pt will increase stroke impact scale to 88% for improved function and participation in life post CVA. Target: 02/13/15   Status Achieved  94.4% on 02/20/2015        Remaining deficits: Mild gait impairments but independent with ambulation    Education / Equipment: HEP  Plan: Patient agrees to discharge.  Patient goals were met. Patient is being discharged due to meeting the stated rehab goals.  ?????

## 2015-03-31 ENCOUNTER — Ambulatory Visit (INDEPENDENT_AMBULATORY_CARE_PROVIDER_SITE_OTHER): Payer: Medicare PPO | Admitting: Internal Medicine

## 2015-03-31 ENCOUNTER — Encounter: Payer: Self-pay | Admitting: Internal Medicine

## 2015-03-31 VITALS — BP 130/80 | HR 86 | Temp 98.1°F | Resp 20 | Ht 66.0 in | Wt 140.0 lb

## 2015-03-31 DIAGNOSIS — I10 Essential (primary) hypertension: Secondary | ICD-10-CM

## 2015-03-31 DIAGNOSIS — I6350 Cerebral infarction due to unspecified occlusion or stenosis of unspecified cerebral artery: Secondary | ICD-10-CM | POA: Diagnosis not present

## 2015-03-31 DIAGNOSIS — I639 Cerebral infarction, unspecified: Secondary | ICD-10-CM

## 2015-03-31 NOTE — Progress Notes (Signed)
Pre visit review using our clinic review tool, if applicable. No additional management support is needed unless otherwise documented below in the visit note. 

## 2015-03-31 NOTE — Progress Notes (Signed)
Subjective:    Patient ID: Teresa Martinez, female    DOB: 08/09/1941, 74 y.o.   MRN: [id number]  HPI  74 year old patient who has hypertension and has had a recent left pontine stroke.  She continues with physical therapy for right sided weakness. She was seen recently for swelling involving the left axilla area.  A venous Doppler study was performed and revealed no abnormalities.  Deep and superficial vein system were described as normal.  There is no pain or progression of the swelling  Past Medical History  Diagnosis Date  . Hyperlipemia   . Statin intolerance   . Menopausal syndrome     Teresa Pen(Sherry MelroseDickstein)  . Hypertension   . Overactive bladder   . Cervical spine degeneration     C5/C6  . Chronic right shoulder pain   . Hemiparesis affecting dominant side as late effect of cerebrovascular accident 12/07/2014    History   Social History  . Marital Status: Married    Spouse Name: N/A  . Number of Children: 4  . Years of Education: N/A   Occupational History  . retired    Social History Main Topics  . Smoking status: Never Smoker   . Smokeless tobacco: Never Used  . Alcohol Use: No  . Drug Use: No  . Sexual Activity: Not on file   Other Topics Concern  . Not on file   Social History Narrative   Regular exercise, 4 children, 6 grandchildren, one stepchild and two additional stepgrandchildren   Patient is right handed.   Patient doesn't drink caffeine.    Past Surgical History  Procedure Laterality Date  . Cholecystectomy      1950  . Band hemorrhoidectomy      2010  . Parotidectomy      30 years ago  . Abdominal hysterectomy      1985    Family History  Problem Relation Age of Onset  . Heart failure Father   . Diabetes Mother   . Hypertension Mother   . Stroke Mother   . Cirrhosis Sister   . Hyperlipidemia Brother   . Hypertension Brother   . Stroke Brother   . Diabetes Brother     Allergies  Allergen Reactions  . Latex Other (See  Comments)    Burns her skin  . Pravastatin Other (See Comments)    Leg pains    Current Outpatient Prescriptions on File Prior to Visit  Medication Sig Dispense Refill  . aspirin EC 325 MG EC tablet Take 1 tablet (325 mg total) by mouth daily. 100 tablet 1  . fesoterodine (TOVIAZ) 4 MG TB24 tablet Take 1 tablet (4 mg total) by mouth daily. 90 tablet 0  . lisinopril (PRINIVIL,ZESTRIL) 20 MG tablet Take 1 tablet (20 mg total) by mouth daily. 90 tablet 0  . rosuvastatin (CRESTOR) 10 MG tablet Take 1 tablet (10 mg total) by mouth daily. 90 tablet 3   No current facility-administered medications on file prior to visit.    BP 150/80 mmHg  Pulse 86  Temp(Src) 98.1 F (36.7 C) (Oral)  Resp 20  Ht 5\' 6"  (1.676 m)  Wt 140 lb (63.504 kg)  BMI 22.61 kg/m2  SpO2 98%     Review of Systems  Constitutional: Negative.   HENT: Negative for congestion, dental problem, hearing loss, rhinorrhea, sinus pressure, sore throat and tinnitus.   Eyes: Negative for pain, discharge and visual disturbance.  Respiratory: Negative for cough and shortness of breath.  Cardiovascular: Negative for chest pain, palpitations and leg swelling.  Gastrointestinal: Negative for nausea, vomiting, abdominal pain, diarrhea, constipation, blood in stool and abdominal distention.  Genitourinary: Negative for dysuria, urgency, frequency, hematuria, flank pain, vaginal bleeding, vaginal discharge, difficulty urinating, vaginal pain and pelvic pain.  Musculoskeletal: Negative for joint swelling, arthralgias and gait problem.  Skin: Negative for rash.  Neurological: Negative for dizziness, syncope, speech difficulty, weakness, numbness and headaches.  Hematological: Negative for adenopathy.  Psychiatric/Behavioral: Negative for behavioral problems, dysphoric mood and agitation. The patient is not nervous/anxious.        Objective:   Physical Exam  Constitutional: She appears well-developed and well-nourished. No  distress.  Blood pressure 130/80  Lymphadenopathy:   Left axilla examined a superficial cordlike  lesion noted which was nontender.  There is no erythema or adenopathy.  No swelling involving the distal left arm          Assessment & Plan:    abnormality left axilla.  This seems most consistent with a superficial thrombosed vein.  Venous Doppler study unremarkable.  Will apply a warm compresses and observe at this time  Hypertension, stable

## 2015-03-31 NOTE — Patient Instructions (Signed)
Apply warm compresses to the left axilla 3 times daily Return the office if you develop any worsening or new symptoms

## 2015-04-11 ENCOUNTER — Ambulatory Visit (HOSPITAL_BASED_OUTPATIENT_CLINIC_OR_DEPARTMENT_OTHER): Payer: Medicare PPO | Admitting: Physical Medicine & Rehabilitation

## 2015-04-11 ENCOUNTER — Encounter: Payer: Self-pay | Admitting: Physical Medicine & Rehabilitation

## 2015-04-11 ENCOUNTER — Encounter: Payer: Medicare PPO | Attending: Physical Medicine & Rehabilitation

## 2015-04-11 VITALS — BP 142/62 | HR 78 | Resp 14

## 2015-04-11 DIAGNOSIS — G5612 Other lesions of median nerve, left upper limb: Secondary | ICD-10-CM | POA: Diagnosis not present

## 2015-04-11 DIAGNOSIS — R262 Difficulty in walking, not elsewhere classified: Secondary | ICD-10-CM | POA: Insufficient documentation

## 2015-04-11 DIAGNOSIS — R279 Unspecified lack of coordination: Secondary | ICD-10-CM | POA: Insufficient documentation

## 2015-04-11 DIAGNOSIS — G8111 Spastic hemiplegia affecting right dominant side: Secondary | ICD-10-CM | POA: Diagnosis not present

## 2015-04-11 NOTE — Patient Instructions (Signed)

## 2015-04-11 NOTE — Progress Notes (Signed)
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 Symptoms are not relieved by conservative care including splinting.  Informed consent was obtained after describing risks and benefits of the procedure with the patient. These include bleeding bruising and infection as well as nerve injury. Patient elected to proceed and has given written consent. The distal wrist crease was marked and prepped with Betadine. A 30-gauge 1/2 inch needle was inserted and 0.25 ML's of 1% lidocaine injected into the skin and subcutaneous tissue. Then a 27-gauge 1/2 inch needle was inserted into the carpal tunnel and 0.25 mL of betamethasone 6 mg per mL was injected. Patient tolerated procedure well. Post procedure instructions given.

## 2015-05-11 ENCOUNTER — Encounter: Payer: Self-pay | Admitting: Neurology

## 2015-05-11 ENCOUNTER — Ambulatory Visit (INDEPENDENT_AMBULATORY_CARE_PROVIDER_SITE_OTHER): Payer: Medicare PPO | Admitting: Neurology

## 2015-05-11 VITALS — BP 131/74 | HR 96 | Ht 66.0 in | Wt 142.4 lb

## 2015-05-11 DIAGNOSIS — I6350 Cerebral infarction due to unspecified occlusion or stenosis of unspecified cerebral artery: Secondary | ICD-10-CM | POA: Diagnosis not present

## 2015-05-11 DIAGNOSIS — I639 Cerebral infarction, unspecified: Secondary | ICD-10-CM

## 2015-05-11 DIAGNOSIS — I69359 Hemiplegia and hemiparesis following cerebral infarction affecting unspecified side: Secondary | ICD-10-CM | POA: Diagnosis not present

## 2015-05-11 NOTE — Progress Notes (Signed)
Reason for visit: History of stroke  Teresa Martinez is an 74 y.o. female  History of present illness:  Teresa Martinez is a 74 year old right-handed black female with a history of a pontine stroke associated with a right hemiparesis. The patient has done quite well over time, she has completed her outpatient physical therapy. She is back to driving some, and she is doing all previous activities of daily living with exception that she is not cooking as much. The patient has some difficulty holding a knife in her right hand, the hand is somewhat clumsy. She has carpal tunnel syndrome on the left side, and some achy sensations in the left upper arm and shoulder. The patient has some mild gait instability, she has a cane but she does not use it. She denies any new symptoms of numbness or weakness. She does have some mild residual memory problems, particularly with names. She returns to this office for an evaluation.  Past Medical History  Diagnosis Date  . Hyperlipemia   . Statin intolerance   . Menopausal syndrome     Teresa Martinez)  . Hypertension   . Overactive bladder   . Cervical spine degeneration     C5/C6  . Chronic right shoulder pain   . Hemiparesis affecting dominant side as late effect of cerebrovascular accident 12/07/2014    Past Surgical History  Procedure Laterality Date  . Cholecystectomy      1950  . Band hemorrhoidectomy      2010  . Parotidectomy      30 years ago  . Abdominal hysterectomy      1985    Family History  Problem Relation Age of Onset  . Heart failure Father   . Diabetes Mother   . Hypertension Mother   . Stroke Mother   . Cirrhosis Sister   . Hyperlipidemia Brother   . Hypertension Brother   . Stroke Brother   . Diabetes Brother     Social history:  reports that she has never smoked. She has never used smokeless tobacco. She reports that she does not drink alcohol or use illicit drugs.    Allergies  Allergen  Reactions  . Latex Other (See Comments)    Burns her skin  . Pravastatin Other (See Comments)    Leg pains    Medications:  Prior to Admission medications   Medication Sig Start Date End Date Taking? Authorizing Provider  aspirin EC 325 MG EC tablet Take 1 tablet (325 mg total) by mouth daily. 11/01/14  Yes Evlyn Kanner Love, PA-C  fesoterodine (TOVIAZ) 4 MG TB24 tablet Take 1 tablet (4 mg total) by mouth daily. 02/04/15  Yes Gordy Savers, MD  lisinopril (PRINIVIL,ZESTRIL) 20 MG tablet Take 1 tablet (20 mg total) by mouth daily. 02/04/15  Yes Gordy Savers, MD  rosuvastatin (CRESTOR) 10 MG tablet Take 1 tablet (10 mg total) by mouth daily. 02/17/15  Yes Gordy Savers, MD    ROS:  Out of a complete 14 system review of symptoms, the patient complains only of the following symptoms, and all other reviewed systems are negative.  Insomnia Joint pain, walking difficulties  Blood pressure 131/74, pulse 96, height 5\' 6"  (1.676 m), weight 142 lb 6.4 oz (64.592 kg).  Physical Exam  General: The patient is alert and cooperative at the time of the examination.  Skin: No significant peripheral edema is noted.   Neurologic Exam  Mental status: The patient is alert and oriented x  3 at the time of the examination. The patient has apparent normal recent and remote memory, with an apparently normal attention span and concentration ability.   Cranial nerves: Facial symmetry is not present, minimal depression of the right nasolabial fold is seen. Speech is normal, no aphasia or dysarthria is noted. Extraocular movements are full. Visual fields are full.  Motor: The patient has good strength in all 4 extremities.  Sensory examination: Soft touch sensation is symmetric on the face, arms, and legs.  Coordination: The patient has good finger-nose-finger and heel-to-shin bilaterally. Minimal clumsiness of the right hand is noted.  Gait and station: The patient has a normal gait. Tandem  gait is normal. Romberg is negative. No drift is seen.  Reflexes: Deep tendon reflexes are symmetric.   Assessment/Plan:  1. History of pontine stroke  The patient has done quite well following the stroke. The patient has been placed on Crestor, she is able to tolerate a 10 mg dose 3 times a week. At this time, she is to remain on aspirin therapy, she will follow-up through this office on an as-needed basis.  Marlan Palau MD 05/11/2015 8:06 PM  [facility] [address] [place], [place] [address]  Phone [phone] Fax [phone]

## 2015-05-11 NOTE — Patient Instructions (Signed)
Stroke Prevention Some medical conditions and behaviors are associated with an increased chance of having a stroke. You may prevent a stroke by making healthy choices and managing medical conditions. HOW CAN I REDUCE MY RISK OF HAVING A STROKE?   Stay physically active. Get at least 30 minutes of activity on most or all days.  Do not smoke. It may also be helpful to avoid exposure to secondhand smoke.  Limit alcohol use. Moderate alcohol use is considered to be:  No more than 2 drinks per day for men.  No more than 1 drink per day for nonpregnant women.  Eat healthy foods. This involves:  Eating 5 or more servings of fruits and vegetables a day.  Making dietary changes that address high blood pressure (hypertension), high cholesterol, diabetes, or obesity.  Manage your cholesterol levels.  Making food choices that are high in fiber and low in saturated fat, trans fat, and cholesterol may control cholesterol levels.  Take any prescribed medicines to control cholesterol as directed by your health care provider.  Manage your diabetes.  Controlling your carbohydrate and sugar intake is recommended to manage diabetes.  Take any prescribed medicines to control diabetes as directed by your health care provider.  Control your hypertension.  Making food choices that are low in salt (sodium), saturated fat, trans fat, and cholesterol is recommended to manage hypertension.  Take any prescribed medicines to control hypertension as directed by your health care provider.  Maintain a healthy weight.  Reducing calorie intake and making food choices that are low in sodium, saturated fat, trans fat, and cholesterol are recommended to manage weight.  Stop drug abuse.  Avoid taking birth control pills.  Talk to your health care provider about the risks of taking birth control pills if you are over 35 years old, smoke, get migraines, or have ever had a blood clot.  Get evaluated for sleep  disorders (sleep apnea).  Talk to your health care provider about getting a sleep evaluation if you snore a lot or have excessive sleepiness.  Take medicines only as directed by your health care provider.  For some people, aspirin or blood thinners (anticoagulants) are helpful in reducing the risk of forming abnormal blood clots that can lead to stroke. If you have the irregular heart rhythm of atrial fibrillation, you should be on a blood thinner unless there is a good reason you cannot take them.  Understand all your medicine instructions.  Make sure that other conditions (such as anemia or atherosclerosis) are addressed. SEEK IMMEDIATE MEDICAL CARE IF:   You have sudden weakness or numbness of the face, arm, or leg, especially on one side of the body.  Your face or eyelid droops to one side.  You have sudden confusion.  You have trouble speaking (aphasia) or understanding.  You have sudden trouble seeing in one or both eyes.  You have sudden trouble walking.  You have dizziness.  You have a loss of balance or coordination.  You have a sudden, severe headache with no known cause.  You have new chest pain or an irregular heartbeat. Any of these symptoms may represent a serious problem that is an emergency. Do not wait to see if the symptoms will go away. Get medical help at once. Call your local emergency services (911 in [place] Do not drive yourself to the hospital. Document Released: 12/12/2004 Document Revised: 03/21/2014 Document Reviewed: 05/07/2013 [employer] Patient Information 2015 [employer]. This information is not intended to replace advice given   to you by your health care provider. Make sure you discuss any questions you have with your health care provider.  

## 2015-05-15 ENCOUNTER — Encounter: Payer: Medicare PPO | Attending: Physical Medicine & Rehabilitation

## 2015-05-15 ENCOUNTER — Ambulatory Visit (HOSPITAL_BASED_OUTPATIENT_CLINIC_OR_DEPARTMENT_OTHER): Payer: Medicare PPO | Admitting: Physical Medicine & Rehabilitation

## 2015-05-15 ENCOUNTER — Encounter: Payer: Self-pay | Admitting: Physical Medicine & Rehabilitation

## 2015-05-15 VITALS — BP 130/81 | HR 75 | Resp 14

## 2015-05-15 DIAGNOSIS — R279 Unspecified lack of coordination: Secondary | ICD-10-CM | POA: Diagnosis not present

## 2015-05-15 DIAGNOSIS — G8111 Spastic hemiplegia affecting right dominant side: Secondary | ICD-10-CM | POA: Diagnosis not present

## 2015-05-15 DIAGNOSIS — R262 Difficulty in walking, not elsewhere classified: Secondary | ICD-10-CM | POA: Diagnosis not present

## 2015-05-15 DIAGNOSIS — G5602 Carpal tunnel syndrome, left upper limb: Secondary | ICD-10-CM | POA: Diagnosis not present

## 2015-05-15 DIAGNOSIS — I6350 Cerebral infarction due to unspecified occlusion or stenosis of unspecified cerebral artery: Secondary | ICD-10-CM

## 2015-05-15 DIAGNOSIS — M7501 Adhesive capsulitis of right shoulder: Secondary | ICD-10-CM | POA: Diagnosis not present

## 2015-05-15 DIAGNOSIS — I639 Cerebral infarction, unspecified: Secondary | ICD-10-CM

## 2015-05-15 MED ORDER — DICLOFENAC SODIUM 1 % TD GEL: 2.0000 g | Freq: Four times a day (QID) | TRANSDERMAL | Status: DC

## 2015-05-15 NOTE — Patient Instructions (Signed)
Please practice reaching behind her back with your right arm thumb up

## 2015-05-15 NOTE — Progress Notes (Signed)
Subjective:    Patient ID: Teresa Martinez, female    DOB: 1941/03/24, 74 y.o.   MRN: [id number] 74 y.o. RH-female with history of HTN, hyperlipidemia who was admitted on 10/14/14 with acute onset of slurred speech and right sided weakness. Patient with sputtering symptoms and was treated with tPA due to recurrent nature of symptoms. CTA head/neck with 50% stenosis of R-ICA with luminal irregularity of bilateral ICA question fibromuscular dysplasia, severe calcific atherosclerosis of the carotid siphon and mild luminal irregularity of VA with extrinsic compression due to cervical spine degenerative changes. MRI/MRA brain done revealing acute left pontine infarct with lobular mass right parotid gland Admit date: 10/19/2014 Discharge date: 11/01/2014 HPI Right shoulder still a little stiff, overall better after injection Left upper arm also hurts. She cannot tell what causes of this. Left hand tingling and numbness and pain in the wrist improved after carpal tunnel injection  Working out 30 minutes on a treadmill at 3 miles an hour at a 3% grade Independent with all her bathing and dressing  Drove here today very happy about that first time out by herself, did graduated return to driving program with family  Pain Inventory Average Pain 0 Pain Right Now 0 My pain is no pain  In the last 24 hours, has pain interfered with the following? General activity 0 Relation with others 0 Enjoyment of life 0 What TIME of day is your pain at its worst? evening Sleep (in general) Fair  Pain is worse with: some activites Pain improves with: no pain Relief from Meds: no pain  Mobility walk without assistance how many minutes can you walk? 30 ability to climb steps?  yes do you drive?  yes  Function not employed: date last employed .  Neuro/Psych bladder control problems  Prior Studies Any changes since last visit?  no  Physicians involved in your care Any changes since last  visit?  no   Family History  Problem Relation Age of Onset  . Heart failure Father   . Diabetes Mother   . Hypertension Mother   . Stroke Mother   . Cirrhosis Sister   . Hyperlipidemia Brother   . Hypertension Brother   . Stroke Brother   . Diabetes Brother    History   Social History  . Marital Status: Married    Spouse Name: N/A  . Number of Children: 4  . Years of Education: N/A   Occupational History  . retired    Social History Main Topics  . Smoking status: Never Smoker   . Smokeless tobacco: Never Used  . Alcohol Use: No  . Drug Use: No  . Sexual Activity: Not on file   Other Topics Concern  . None   Social History Narrative   Regular exercise, 4 children, 6 grandchildren, one stepchild and two additional stepgrandchildren   Patient is right handed.   Patient doesn't drink caffeine.   Past Surgical History  Procedure Laterality Date  . Cholecystectomy      1950  . Band hemorrhoidectomy      2010  . Parotidectomy      30 years ago  . Abdominal hysterectomy      1985   Past Medical History  Diagnosis Date  . Hyperlipemia   . Statin intolerance   . Menopausal syndrome     [name])  . Hypertension   . Overactive bladder   . Cervical spine degeneration     C5/C6  . Chronic right shoulder  pain   . Hemiparesis affecting dominant side as late effect of cerebrovascular accident 12/07/2014   BP 130/81 mmHg  Pulse 75  Resp 14  SpO2 96%  Opioid Risk Score:   Fall Risk Score: Low Fall Risk (0-5 points)`1  Depression screen PHQ 2/9  [facility] 02/06/2015 08/26/2014  Decreased Interest 0 0  Down, Depressed, Hopeless 0 0  PHQ - 2 Score 0 0  Altered sleeping 2 -  Tired, decreased energy 1 -  Change in appetite 0 -  Feeling bad or failure about yourself  0 -  Trouble concentrating 0 -  Moving slowly or fidgety/restless 1 -  Suicidal thoughts 0 -  PHQ-9 Score 4 -    Review of Systems  Genitourinary: Positive for  vaginal discharge.       Bladder control problems   All other systems reviewed and are negative.      Objective:   Physical Exam  Constitutional: She is oriented to person, place, and time. She appears well-developed and well-nourished.  HENT:  Head: Normocephalic and atraumatic.  Eyes: Conjunctivae and EOM are normal. Pupils are equal, round, and reactive to light.  Neck: Normal range of motion.  Neurological: She is alert and oriented to person, place, and time.  Psychiatric: She has a normal mood and affect.  Nursing note and vitals reviewed.   Int rotation hand behind back L2 on RIght , T2 on L Motor strength is 4 minus at the Right deltoid bicep tricep grip 4+ at the Right hip flexor and extensor ankle dorsiflexor 5/5 in the left upper and left lower extremity. Sensation intact to light touch and pinprick in bilateral upper and lower extremities. Pain with left shoulder adduction Negative impingement signs on the left side Mildly positive impingement sign on the right side Decreased internal rotation and external rotation in the right upper extremity at shoulder.    Assessment & Plan:  1. Left pontine infarct with right hemiparesis she's had a good improvement in functional status still has residual weakness in the right upper greater than right lower extremity.  2. Right shoulder adhesive capsulitis overall improved from pain standpoint still limited with range of motion. Recommend more aggressive internal rotation stretching  3. Left shoulder painAC joint, trial of Voltaren gel, consider injection under ultrasound guidance if no better

## 2015-06-05 DIAGNOSIS — Z1231 Encounter for screening mammogram for malignant neoplasm of breast: Secondary | ICD-10-CM | POA: Diagnosis not present

## 2015-06-30 ENCOUNTER — Ambulatory Visit (HOSPITAL_BASED_OUTPATIENT_CLINIC_OR_DEPARTMENT_OTHER): Payer: Medicare PPO | Admitting: Physical Medicine & Rehabilitation

## 2015-06-30 ENCOUNTER — Encounter: Payer: Medicare PPO | Attending: Physical Medicine & Rehabilitation

## 2015-06-30 ENCOUNTER — Encounter: Payer: Self-pay | Admitting: Physical Medicine & Rehabilitation

## 2015-06-30 VITALS — BP 147/79 | HR 76

## 2015-06-30 DIAGNOSIS — M19011 Primary osteoarthritis, right shoulder: Secondary | ICD-10-CM

## 2015-06-30 DIAGNOSIS — G8111 Spastic hemiplegia affecting right dominant side: Secondary | ICD-10-CM | POA: Diagnosis not present

## 2015-06-30 DIAGNOSIS — R262 Difficulty in walking, not elsewhere classified: Secondary | ICD-10-CM | POA: Diagnosis not present

## 2015-06-30 DIAGNOSIS — R279 Unspecified lack of coordination: Secondary | ICD-10-CM | POA: Diagnosis not present

## 2015-06-30 DIAGNOSIS — M19019 Primary osteoarthritis, unspecified shoulder: Secondary | ICD-10-CM | POA: Insufficient documentation

## 2015-06-30 NOTE — Patient Instructions (Signed)
Joint Injection  Care After  Refer to this sheet in the next few days. These instructions provide you with information on caring for yourself after you have had a joint injection. Your caregiver also may give you more specific instructions. Your treatment has been planned according to current medical practices, but problems sometimes occur. Call your caregiver if you have any problems or questions after your procedure.  After any type of joint injection, it is not uncommon to experience:  · Soreness, swelling, or bruising around the injection site.  · Mild numbness, tingling, or weakness around the injection site caused by the numbing medicine used before or with the injection.  It also is possible to experience the following effects associated with the specific agent after injection:  · Iodine-based contrast agents:  ¨ Allergic reaction (itching, hives, widespread redness, and swelling beyond the injection site).  · Corticosteroids (These effects are rare.):  ¨ Allergic reaction.  ¨ Increased blood sugar levels (If you have diabetes and you notice that your blood sugar levels have increased, notify your caregiver).  ¨ Increased blood pressure levels.  ¨ Mood swings.  · Hyaluronic acid in the use of viscosupplementation.  ¨ Temporary heat or redness.  ¨ Temporary rash and itching.  ¨ Increased fluid accumulation in the injected joint.  These effects all should resolve within a day after your procedure.   HOME CARE INSTRUCTIONS  · Limit yourself to light activity the day of your procedure. Avoid lifting heavy objects, bending, stooping, or twisting.  · Take prescription or over-the-counter pain medication as directed by your caregiver.  · You may apply ice to your injection site to reduce pain and swelling the day of your procedure. Ice may be applied 03-04 times:  ¨ Put ice in a plastic bag.  ¨ Place a towel between your skin and the bag.  ¨ Leave the ice on for no longer than 15-20 minutes each time.  SEEK  IMMEDIATE MEDICAL CARE IF:   · Pain and swelling get worse rather than better or extend beyond the injection site.  · Numbness does not go away.  · Blood or fluid continues to leak from the injection site.  · You have chest pain.  · You have swelling of your face or tongue.  · You have trouble breathing or you become dizzy.  · You develop a fever, chills, or severe tenderness at the injection site that last longer than 1 day.  MAKE SURE YOU:  · Understand these instructions.  · Watch your condition.  · Get help right away if you are not doing well or if you get worse.  Document Released: 07/18/2011 Document Revised: 01/27/2012 Document Reviewed: 07/18/2011  ExitCare® Patient Information ©2015 [employer]. This information is not intended to replace advice given to you by your health care provider. Make sure you discuss any questions you have with your health care provider.

## 2015-06-30 NOTE — Progress Notes (Signed)
Shoulder injection Right With ultrasound guidance  Indication: Right Shoulder pain not relieved by medication management and other conservative care.tried diclofenac gel last month without improvement, pain impedes dresing and bathing.  Informed consent was obtained after describing risks and benefits of the procedure with the patient, this includes bleeding, bruising, infection and medication side effects. The patient wishes to proceed and has given written consent. Patient was placed in a seated position. The Right shoulder was marked and prepped with betadine in the AC joint  area. A 25-gauge 1-1/2 inch needle was inserted into the Acromioclavicular joint under direct ultrasound visualization using linear transducer. After negative draw back for blood, a solution containing .5 mL of 6 mg per ML betamethasone and 1 mL of 1% lidocaine was injected. A band aid was applied. The patient tolerated the procedure well. Post procedure instructions were given.  

## 2015-07-10 ENCOUNTER — Ambulatory Visit (INDEPENDENT_AMBULATORY_CARE_PROVIDER_SITE_OTHER): Payer: Medicare PPO | Admitting: Internal Medicine

## 2015-07-10 ENCOUNTER — Encounter: Payer: Self-pay | Admitting: Internal Medicine

## 2015-07-10 VITALS — BP 140/86 | HR 74 | Temp 98.4°F | Resp 20 | Ht 66.0 in | Wt 140.0 lb

## 2015-07-10 DIAGNOSIS — E7800 Pure hypercholesterolemia, unspecified: Secondary | ICD-10-CM

## 2015-07-10 DIAGNOSIS — I10 Essential (primary) hypertension: Secondary | ICD-10-CM

## 2015-07-10 DIAGNOSIS — R1013 Epigastric pain: Secondary | ICD-10-CM

## 2015-07-10 DIAGNOSIS — K5900 Constipation, unspecified: Secondary | ICD-10-CM | POA: Diagnosis not present

## 2015-07-10 NOTE — Progress Notes (Signed)
Pre visit review using our clinic review tool, if applicable. No additional management support is needed unless otherwise documented below in the visit note. 

## 2015-07-10 NOTE — Progress Notes (Signed)
Subjective:    Patient ID: Teresa Martinez, female    DOB: 12-Aug-1941, 74 y.o.   MRN: [id number]  HPI 74 year old patient who has essential hypertension, dyslipidemia and cerebrovascular disease. She presents with some dyspepsia.  She describes some belching and gaseousness.  She has been taking Tums with some benefit.  She also has had some constipation issues and has been taking some intermittent stool softeners and laxatives with nice results.  She does adhere to a high-fiber diet.  Past Medical History  Diagnosis Date  . Hyperlipemia   . Statin intolerance   . Menopausal syndrome     [name])  . Hypertension   . Overactive bladder   . Cervical spine degeneration     C5/C6  . Chronic right shoulder pain   . Hemiparesis affecting dominant side as late effect of cerebrovascular accident 12/07/2014    Social History   Social History  . Marital Status: Married    Spouse Name: N/A  . Number of Children: 4  . Years of Education: N/A   Occupational History  . retired    Social History Main Topics  . Smoking status: Never Smoker   . Smokeless tobacco: Never Used  . Alcohol Use: No  . Drug Use: No  . Sexual Activity: Not on file   Other Topics Concern  . Not on file   Social History Narrative   Regular exercise, 4 children, 6 grandchildren, one stepchild and two additional stepgrandchildren   Patient is right handed.   Patient doesn't drink caffeine.    Past Surgical History  Procedure Laterality Date  . Cholecystectomy      1950  . Band hemorrhoidectomy      2010  . Parotidectomy      30 years ago  . Abdominal hysterectomy      1985    Family History  Problem Relation Age of Onset  . Heart failure Father   . Diabetes Mother   . Hypertension Mother   . Stroke Mother   . Cirrhosis Sister   . Hyperlipidemia Brother   . Hypertension Brother   . Stroke Brother   . Diabetes Brother     Allergies  Allergen Reactions  . Latex Other  (See Comments)    Burns her skin  . Pravastatin Other (See Comments)    Leg pains    Current Outpatient Prescriptions on File Prior to Visit  Medication Sig Dispense Refill  . aspirin EC 325 MG EC tablet Take 1 tablet (325 mg total) by mouth daily. 100 tablet 1  . diclofenac sodium (VOLTAREN) 1 % GEL Apply 2 g topically 4 (four) times daily. 3 Tube 1  . fesoterodine (TOVIAZ) 4 MG TB24 tablet Take 1 tablet (4 mg total) by mouth daily. 90 tablet 0  . lisinopril (PRINIVIL,ZESTRIL) 20 MG tablet Take 1 tablet (20 mg total) by mouth daily. 90 tablet 0  . rosuvastatin (CRESTOR) 10 MG tablet Take 1 tablet (10 mg total) by mouth daily. 90 tablet 3   No current facility-administered medications on file prior to visit.    BP 140/86 mmHg  Pulse 74  Temp(Src) 98.4 F (36.9 C) (Oral)  Resp 20  Ht  (1.676 m)  Wt 140 lb (63.504 kg)  BMI 22.61 kg/m2  SpO2 98%      Review of Systems  Constitutional: Negative.   HENT: Negative for congestion, dental problem, hearing loss, rhinorrhea, sinus pressure, sore throat and tinnitus.   Eyes: Negative for pain,  discharge and visual disturbance.  Respiratory: Negative for cough and shortness of breath.   Cardiovascular: Negative for chest pain, palpitations and leg swelling.  Gastrointestinal: Positive for constipation. Negative for nausea, vomiting, abdominal pain, diarrhea, blood in stool and abdominal distention.  Genitourinary: Negative for dysuria, urgency, frequency, hematuria, flank pain, vaginal bleeding, vaginal discharge, difficulty urinating, vaginal pain and pelvic pain.  Musculoskeletal: Negative for joint swelling, arthralgias and gait problem.  Skin: Negative for rash.  Neurological: Negative for dizziness, syncope, speech difficulty, weakness, numbness and headaches.  Hematological: Negative for adenopathy.  Psychiatric/Behavioral: Negative for behavioral problems, dysphoric mood and agitation. The patient is not nervous/anxious.          Objective:   Physical Exam  Constitutional: She is oriented to person, place, and time. She appears well-developed and well-nourished.  HENT:  Head: Normocephalic.  Right Ear: External ear normal.  Left Ear: External ear normal.  Mouth/Throat: Oropharynx is clear and moist.  Eyes: Conjunctivae and EOM are normal. Pupils are equal, round, and reactive to light.  Neck: Normal range of motion. Neck supple. No thyromegaly present.  Cardiovascular: Normal rate, regular rhythm, normal heart sounds and intact distal pulses.   Pulmonary/Chest: Effort normal and breath sounds normal.  Abdominal: Soft. Bowel sounds are normal. She exhibits no distension and no mass. There is no tenderness. There is no rebound and no guarding.  Musculoskeletal: Normal range of motion.  Lymphadenopathy:    She has no cervical adenopathy.  Neurological: She is alert and oriented to person, place, and time.  Skin: Skin is warm and dry. No rash noted.  Psychiatric: She has a normal mood and affect. Her behavior is normal.          Assessment & Plan:   Dyspepsia.  Will place on a bland diet.  Continue Tums.  If symptoms persist, will add omeprazole short-term Constipation.  Treatment addressed and information dispensed Essential hypertension Dyslipidemia.  Patient is tolerating Crestor.  3 times weekly.  We'll continue present dose  CPX as scheduled in October

## 2015-07-10 NOTE — Patient Instructions (Signed)
Avoids foods high in acid such as tomatoes citrus juices, and spicy foods.  Avoid eating within two hours of lying down or before exercising.  Do not overheat.  Try smaller more frequent meals.  If symptoms persist, take omeprazole 20 mg daily for 2 weeks  Constipation HOME CARE   Eat foods with a lot of fiber in them. This includes fruits, vegetables, beans, and whole grains such as brown rice.  Avoid fatty foods and foods with a lot of sugar. This includes french fries, hamburgers, cookies, candy, and soda.  If you are not getting enough fiber from food, take products with added fiber in them (supplements).  Drink enough fluid to keep your pee (urine) clear or pale yellow.  Exercise on a regular basis, or as told by your doctor.  Go to the restroom when you feel like you need to poop. Do not hold it.  Only take medicine as told by your doctor. Do not take medicines that help you poop (laxatives) without talking to your doctor first. GET HELP RIGHT AWAY IF:   You have bright red blood in your poop (stool).  Your constipation lasts more than 4 days or gets worse.  You have belly (abdominal) or butt (rectal) pain.  You have thin poop (as thin as a pencil).  You lose weight, and it cannot be explained. MAKE SURE YOU:   Understand these instructions.  Will watch your condition.  Will get help right away if you are not doing well or get worse. Document Released: 04/22/2008 Document Revised: 11/09/2013 Document Reviewed: 08/16/2013 [facility] Patient Information 2015 [place], [place]. This information is not intended to replace advice given to you by your health care provider. Make sure you discuss any questions you have with your health care provider.

## 2015-08-01 DIAGNOSIS — Z113 Encounter for screening for infections with a predominantly sexual mode of transmission: Secondary | ICD-10-CM | POA: Diagnosis not present

## 2015-08-01 DIAGNOSIS — Z01419 Encounter for gynecological examination (general) (routine) without abnormal findings: Secondary | ICD-10-CM | POA: Diagnosis not present

## 2015-08-11 ENCOUNTER — Encounter: Payer: Medicare PPO | Attending: Physical Medicine & Rehabilitation

## 2015-08-11 ENCOUNTER — Ambulatory Visit (HOSPITAL_BASED_OUTPATIENT_CLINIC_OR_DEPARTMENT_OTHER): Payer: Medicare PPO | Admitting: Physical Medicine & Rehabilitation

## 2015-08-11 ENCOUNTER — Encounter: Payer: Self-pay | Admitting: Physical Medicine & Rehabilitation

## 2015-08-11 VITALS — BP 142/86 | HR 106

## 2015-08-11 DIAGNOSIS — R262 Difficulty in walking, not elsewhere classified: Secondary | ICD-10-CM | POA: Diagnosis not present

## 2015-08-11 DIAGNOSIS — M25511 Pain in right shoulder: Secondary | ICD-10-CM | POA: Diagnosis not present

## 2015-08-11 DIAGNOSIS — M7501 Adhesive capsulitis of right shoulder: Secondary | ICD-10-CM

## 2015-08-11 DIAGNOSIS — M19011 Primary osteoarthritis, right shoulder: Secondary | ICD-10-CM

## 2015-08-11 DIAGNOSIS — I69359 Hemiplegia and hemiparesis following cerebral infarction affecting unspecified side: Secondary | ICD-10-CM

## 2015-08-11 DIAGNOSIS — I639 Cerebral infarction, unspecified: Secondary | ICD-10-CM

## 2015-08-11 DIAGNOSIS — G8111 Spastic hemiplegia affecting right dominant side: Secondary | ICD-10-CM | POA: Insufficient documentation

## 2015-08-11 DIAGNOSIS — R279 Unspecified lack of coordination: Secondary | ICD-10-CM | POA: Diagnosis not present

## 2015-08-11 DIAGNOSIS — I6350 Cerebral infarction due to unspecified occlusion or stenosis of unspecified cerebral artery: Secondary | ICD-10-CM

## 2015-08-11 DIAGNOSIS — G8929 Other chronic pain: Secondary | ICD-10-CM

## 2015-08-11 NOTE — Progress Notes (Signed)
Subjective:    Patient ID: Teresa Martinez, female    DOB: Sep 30, 1941, 74 y.o.   MRN: [id number]  HPI Improved Right shoulder pain after R AC joint Ultrasound injection 06/30/15.    Independent with self care and mobility. Still does treadmill for 45 minutes 3.3 Mph with 5 deg incline Pt does fine motor exercises as well with RUE Pain Inventory Average Pain 0 Pain Right Now 0 My pain is na  In the last 24 hours, has pain interfered with the following? General activity 0 Relation with others 0 Enjoyment of life 0 What TIME of day is your pain at its worst? na Sleep (in general) NA  Pain is worse with: na Pain improves with: na Relief from Meds: na  Mobility walk without assistance how many minutes can you walk? 45 ability to climb steps?  yes do you drive?  yes transfers alone  Function not employed: date last employed na  Neuro/Psych bladder control problems tingling  Prior Studies Any changes since last visit?  no  Physicians involved in your care Any changes since last visit?  no   Family History  Problem Relation Age of Onset  . Heart failure Father   . Diabetes Mother   . Hypertension Mother   . Stroke Mother   . Cirrhosis Sister   . Hyperlipidemia Brother   . Hypertension Brother   . Stroke Brother   . Diabetes Brother    Social History   Social History  . Marital Status: Married    Spouse Name: N/A  . Number of Children: 4  . Years of Education: N/A   Occupational History  . retired    Social History Main Topics  . Smoking status: Never Smoker   . Smokeless tobacco: Never Used  . Alcohol Use: No  . Drug Use: No  . Sexual Activity: Not Asked   Other Topics Concern  . None   Social History Narrative   Regular exercise, 4 children, 6 grandchildren, one stepchild and two additional stepgrandchildren   Patient is right handed.   Patient doesn't drink caffeine.   Past Surgical History  Procedure Laterality Date  .  Cholecystectomy      1950  . Band hemorrhoidectomy      2010  . Parotidectomy      30 years ago  . Abdominal hysterectomy      1985   Past Medical History  Diagnosis Date  . Hyperlipemia   . Statin intolerance   . Menopausal syndrome     [name])  . Hypertension   . Overactive bladder   . Cervical spine degeneration     C5/C6  . Chronic right shoulder pain   . Hemiparesis affecting dominant side as late effect of cerebrovascular accident 12/07/2014   BP 142/86 mmHg  Pulse 106  SpO2 97%  Opioid Risk Score:   Fall Risk Score:  `1  Depression screen PHQ 2/9  Depression screen [facility] 2/9 08/11/2015 02/06/2015 08/26/2014  Decreased Interest 0 0 0  Down, Depressed, Hopeless 0 0 0  PHQ - 2 Score 0 0 0  Altered sleeping - 2 -  Tired, decreased energy - 1 -  Change in appetite - 0 -  Feeling bad or failure about yourself  - 0 -  Trouble concentrating - 0 -  Moving slowly or fidgety/restless - 1 -  Suicidal thoughts - 0 -  PHQ-9 Score - 4 -     Review of Systems  All other  systems reviewed and are negative.      Objective:   Physical Exam  Constitutional: She is oriented to person, place, and time. She appears well-developed and well-nourished.  HENT:  Head: Normocephalic and atraumatic.  Eyes: Conjunctivae and EOM are normal. Pupils are equal, round, and reactive to light.  Musculoskeletal:       Right shoulder: She exhibits decreased range of motion and swelling. She exhibits no tenderness, no pain and no spasm.  No pain but Shoulder feels tight    Neurological: She is alert and oriented to person, place, and time. Gait abnormal.  Psychiatric: She has a normal mood and affect.  Nursing note and vitals reviewed.         Assessment & Plan:  1.  Right hemiparesis due to CVA, Deficits have stabilized, continue home exercise program.  2.  AC joint injection helpful, if pain recurs will repeat may do as soon as October

## 2015-08-21 ENCOUNTER — Other Ambulatory Visit: Payer: Self-pay | Admitting: Internal Medicine

## 2015-08-28 ENCOUNTER — Encounter: Payer: Self-pay | Admitting: Internal Medicine

## 2015-08-28 ENCOUNTER — Ambulatory Visit (INDEPENDENT_AMBULATORY_CARE_PROVIDER_SITE_OTHER): Payer: Medicare PPO | Admitting: Internal Medicine

## 2015-08-28 VITALS — BP 140/88 | HR 66 | Temp 97.8°F | Resp 20 | Ht 65.0 in | Wt 138.0 lb

## 2015-08-28 DIAGNOSIS — Z23 Encounter for immunization: Secondary | ICD-10-CM | POA: Diagnosis not present

## 2015-08-28 DIAGNOSIS — I6350 Cerebral infarction due to unspecified occlusion or stenosis of unspecified cerebral artery: Secondary | ICD-10-CM

## 2015-08-28 DIAGNOSIS — Z0000 Encounter for general adult medical examination without abnormal findings: Secondary | ICD-10-CM

## 2015-08-28 DIAGNOSIS — I69359 Hemiplegia and hemiparesis following cerebral infarction affecting unspecified side: Secondary | ICD-10-CM

## 2015-08-28 DIAGNOSIS — I10 Essential (primary) hypertension: Secondary | ICD-10-CM

## 2015-08-28 DIAGNOSIS — E7800 Pure hypercholesterolemia, unspecified: Secondary | ICD-10-CM | POA: Diagnosis not present

## 2015-08-28 DIAGNOSIS — I639 Cerebral infarction, unspecified: Secondary | ICD-10-CM

## 2015-08-28 LAB — CBC WITH DIFFERENTIAL/PLATELET
Basophils Absolute: 0 10*3/uL (ref 0.0–0.1)
Basophils Relative: 0.5 % (ref 0.0–3.0)
Eosinophils Absolute: 0.2 10*3/uL (ref 0.0–0.7)
Eosinophils Relative: 2.9 % (ref 0.0–5.0)
HCT: 44.4 % (ref 36.0–46.0)
Hemoglobin: 14.6 g/dL (ref 12.0–15.0)
Lymphocytes Relative: 32.1 % (ref 12.0–46.0)
Lymphs Abs: 2 10*3/uL (ref 0.7–4.0)
MCHC: 32.9 g/dL (ref 30.0–36.0)
MCV: 80.3 fl (ref 78.0–100.0)
Monocytes Absolute: 0.6 10*3/uL (ref 0.1–1.0)
Monocytes Relative: 9.2 % (ref 3.0–12.0)
Neutro Abs: 3.5 10*3/uL (ref 1.4–7.7)
Neutrophils Relative %: 55.3 % (ref 43.0–77.0)
Platelets: 226 10*3/uL (ref 150.0–400.0)
RBC: 5.53 Mil/uL — ABNORMAL HIGH (ref 3.87–5.11)
RDW: 14.7 % (ref 11.5–15.5)
WBC: 6.4 10*3/uL (ref 4.0–10.5)

## 2015-08-28 LAB — COMPREHENSIVE METABOLIC PANEL
ALT: 14 U/L (ref 0–35)
AST: 20 U/L (ref 0–37)
Albumin: 4.5 g/dL (ref 3.5–5.2)
Alkaline Phosphatase: 59 U/L (ref 39–117)
BUN: 15 mg/dL (ref 6–23)
CO2: 31 mEq/L (ref 19–32)
Calcium: 10.2 mg/dL (ref 8.4–10.5)
Chloride: 105 mEq/L (ref 96–112)
Creatinine, Ser: 0.84 mg/dL (ref 0.40–1.20)
GFR: 84.72 mL/min (ref >60.00)
Glucose, Bld: 91 mg/dL (ref 70–99)
Potassium: 4.7 mEq/L (ref 3.5–5.1)
Sodium: 143 mEq/L (ref 135–145)
Total Bilirubin: 0.4 mg/dL (ref 0.2–1.2)
Total Protein: 7.4 g/dL (ref 6.0–8.3)

## 2015-08-28 LAB — TSH: TSH: 0.88 u[IU]/mL (ref 0.35–4.50)

## 2015-08-28 MED ORDER — LISINOPRIL 20 MG PO TABS: 20.0000 mg | ORAL_TABLET | Freq: Every day | ORAL | Status: DC

## 2015-08-28 NOTE — Patient Instructions (Signed)
Limit your sodium (Salt) intake    It is important that you exercise regularly, at least 20 minutes 3 to 4 times per week.  If you develop chest pain or shortness of breath seek  medical attention.  Please check your blood pressure on a regular basis.  If it is consistently greater than 150/90, please make an office appointment.  Return in 6 months for follow-up   

## 2015-08-28 NOTE — Progress Notes (Signed)
Pre visit review using our clinic review tool, if applicable. No additional management support is needed unless otherwise documented below in the visit note. 

## 2015-08-28 NOTE — Progress Notes (Signed)
Patient ID: Teresa Martinez, female   DOB: Oct 12, 1941, 74 y.o.   MRN: [id number] Patient ID: Teresa Martinez, female   DOB: 10/23/1941, 74 y.o.   MRN: [id number]  Subjective:    Patient ID: Teresa Martinez, female    DOB: 1941/05/01, 74 y.o.   MRN: [id number]  HPI  History of Present Illness:   74  year-old patient seen today for a comprehensive evaluation.  She is followed annually by gynecology. She has a history of hypertension overactive bladder and mild dyslipidemia. Her last colonoscopy was in 2014.  She was noted to have clonic polyps at that time and five-year interval recommended ; she has had a recent mammogram. She continues to be followed by gynecology. She is status post remote hysterectomy. Her chronic neck pain for several years duration is improved.  She presently is on Crestor.  She has tolerated other statins poorly in the past.  She is followed by neurology due to cerebrovascular disease.  Recently walked 3.2 miles unassisted in a race.  Prior to her stroke, he won this 5 K race several times in her age bracket   10/14/14. 74 y.o. RH-female with history of HTN, hyperlipidemia who was admitted on 10/14/14 with acute onset of slurred speech and right sided weakness. Patient with sputtering symptoms and was treated with tPA due to recurrent nature of symptoms. CTA head/neck with 50% stenosis of R-ICA with luminal irregularity of bilateral ICA question fibromuscular dysplasia, severe calcific atherosclerosis of the carotid siphon and mild luminal irregularity of VA with extrinsic compression due to cervical spine degenerative changes. MRI/MRA brain done revealing acute left pontine infarct with lobular mass right parotid gland. CTNeck revealed right parotid mass and two right thyroid nodules without adenopathy. Patient was started on ASA for secondary stroke prevention.  Here for Medicare AWV:   1. Risk factors based on Past M, S, F history: cardiovascular  risk factors include hypertension, and dyslipidemia.  2. Physical Activities: Limited due to cerebrovascular disease and mild hemiparesis affecting dominant side 3. Depression/mood: no history of depression or mood disorder  4. Hearing: no deficits  5. ADL's: completely active and independent in all aspects of daily living  6. Fall Risk: Moderate to high due to cerebrovascular disease 7. Home Safety: no problems identifying  8. Height, weight, &visual acuity:height and weight stable. No difficulty with her visual acuity wears glasses  9. Counseling: Heart healthy diet.  Encouraged 10. Labs ordered based on risk factors: laboratory profile, including lipid panel will be reviewed  11. Referral Coordination- GYN referral and neurology 12. Care Plan- heart healthy diet more regular exercise regimen encouraged  13. Cognitive Assessment- alert and oriented with normal affect. No mood disorder, handles all her daily affairs, and no difficulty with executive function or memory. No functional deficits 14.  Preventive services.  The patient will continue to have preventive  examination from gynecology and primary care.  She does have annual mammograms.  She has had a recent colonoscopy within the past 2 years 15.  Provider list- includes GI.  Gynecology radiology and primary care as well as neurology.  Patient was provided with a written and personalized care plan   Current Medications (verified):  1) Lisinopril-Hydrochlorothiazide 20-12.5 Mg Tabs (Lisinopril-Hydrochlorothiazide) .... One Daily  2) Toviaz 4 Mg Xr24h-Tab (Fesoterodine Fumarate) .... One Daily, As Needed   Allergies (verified):  1) ! Latex Exam Gloves (Disposable Gloves)   Past History:  Past Medical History:   Hyperlipidemia   menopausal syndrome (  Floyde Parkins)  hypertension  overactive bladder  Past Surgical History:   Cholecystectomy 1950  Hemorrhoidectomy -band ligation 2010  status post parotidectomy 30 years ago   Colonoscopy 2004 2014  Hysterectomy 1985   Family History:   father died age 62. History congestive heart failure  mother died age 49. History diabetes, hypertension, cerebrovascular disease  Two sisters, one died from nonalcoholic cirrhosis  two brothers, one with a hypercholesterolemia, and hypertension; s/p CVA    Review of Systems  Constitutional: Negative for fever, appetite change, fatigue and unexpected weight change.  HENT: Negative for congestion, dental problem, ear pain, hearing loss, mouth sores, nosebleeds, sinus pressure, sore throat, tinnitus, trouble swallowing and voice change.   Eyes: Negative for photophobia, pain, redness and visual disturbance.  Respiratory: Negative for cough, chest tightness and shortness of breath.   Cardiovascular: Negative for chest pain, palpitations and leg swelling.  Gastrointestinal: Negative for nausea, vomiting, abdominal pain, diarrhea, constipation, blood in stool, abdominal distention and rectal pain.  Genitourinary: Negative for dysuria, urgency, frequency, hematuria, flank pain, vaginal bleeding, vaginal discharge, difficulty urinating, genital sores, vaginal pain, menstrual problem and pelvic pain.  Musculoskeletal: Negative for back pain, arthralgias and neck stiffness.       Chronic posterior neck pain that interferes with sleep  Skin: Negative for rash.  Neurological: Negative for dizziness, syncope, speech difficulty, weakness, light-headedness, numbness and headaches.  Hematological: Negative for adenopathy. Does not bruise/bleed easily.  Psychiatric/Behavioral: Negative for suicidal ideas, behavioral problems, self-injury, dysphoric mood and agitation. The patient is not nervous/anxious.        Objective:   Physical Exam  Constitutional: She is oriented to person, place, and time. She appears well-developed and well-nourished.  HENT:  Head: Normocephalic and atraumatic.  Right Ear: External ear normal.  Left Ear:  External ear normal.  Mouth/Throat: Oropharynx is clear and moist.  Eyes: Conjunctivae and EOM are normal.  Neck: Normal range of motion. Neck supple. No JVD present. No thyromegaly present.  Cardiovascular: Normal rate, regular rhythm, normal heart sounds and intact distal pulses.   No murmur heard. Diminished left dorsalis pedis pulse  Pulmonary/Chest: Effort normal and breath sounds normal. She has no wheezes. She has no rales.  Abdominal: Soft. Bowel sounds are normal. She exhibits no distension and no mass. There is no tenderness. There is no rebound and no guarding.  Musculoskeletal: Normal range of motion. She exhibits no edema or tenderness.  Neurological: She is alert and oriented to person, place, and time. She has normal reflexes. No cranial nerve deficit. She exhibits normal muscle tone. Coordination normal.  Mild impairment.  Finger to nose and heel to shin testing on the right Mild right side hyperreflexia  No drift  Skin: Skin is warm and dry. No rash noted.  Psychiatric: She has a normal mood and affect. Her behavior is normal.          Assessment & Plan:   Preventive health care Dyslipidemia.  Will continue Crestor Hypertension well controlled Cerebrovascular disease.  Status post left brain stroke.  Laboratory studies to be updated Recheck 6 months Exercise low salt diet encouraged

## 2015-09-06 ENCOUNTER — Telehealth: Payer: Self-pay | Admitting: Internal Medicine

## 2015-09-06 MED ORDER — FLUCONAZOLE 150 MG PO TABS: 150.0000 mg | ORAL_TABLET | Freq: Once | ORAL | Status: DC

## 2015-09-06 NOTE — Telephone Encounter (Signed)
Please call/notify patient that lab/test/procedure is normal  Diflucan okay

## 2015-09-06 NOTE — Telephone Encounter (Signed)
Please see message and advise 

## 2015-09-06 NOTE — Telephone Encounter (Signed)
Patient would like a call with her lab results as well as a rx for diflucan called in. States she is having vaginal itching. No odor, minimal discharge that she considers normal. Itching has been going on for about a week. She uses [facility] on [address]

## 2015-09-06 NOTE — Telephone Encounter (Signed)
Spoke to pt, told her labs were normal per Dr.K and Rx for Diflucan sent to pharmacy. Pt verbalized understanding.

## 2015-11-03 ENCOUNTER — Ambulatory Visit (HOSPITAL_BASED_OUTPATIENT_CLINIC_OR_DEPARTMENT_OTHER): Payer: Medicare PPO | Admitting: Physical Medicine & Rehabilitation

## 2015-11-03 ENCOUNTER — Encounter: Payer: Self-pay | Admitting: Physical Medicine & Rehabilitation

## 2015-11-03 ENCOUNTER — Encounter: Payer: Medicare PPO | Attending: Physical Medicine & Rehabilitation

## 2015-11-03 VITALS — BP 159/83 | HR 78 | Resp 14

## 2015-11-03 DIAGNOSIS — M19011 Primary osteoarthritis, right shoulder: Secondary | ICD-10-CM | POA: Diagnosis not present

## 2015-11-03 DIAGNOSIS — R279 Unspecified lack of coordination: Secondary | ICD-10-CM | POA: Insufficient documentation

## 2015-11-03 DIAGNOSIS — R262 Difficulty in walking, not elsewhere classified: Secondary | ICD-10-CM | POA: Diagnosis not present

## 2015-11-03 DIAGNOSIS — G8111 Spastic hemiplegia affecting right dominant side: Secondary | ICD-10-CM | POA: Insufficient documentation

## 2015-11-03 NOTE — Patient Instructions (Signed)
Acromioclavicular joint injection

## 2015-11-03 NOTE — Progress Notes (Signed)
Shoulder injection Right With ultrasound guidance  Indication: Right Shoulder pain not relieved by medication management and other conservative care.tried diclofenac gel last month without improvement, pain impedes dresing and bathing.  Informed consent was obtained after describing risks and benefits of the procedure with the patient, this includes bleeding, bruising, infection and medication side effects. The patient wishes to proceed and has given written consent. Patient was placed in a seated position. The Right shoulder was marked and prepped with betadine [facility]  area. A 25-gauge 1-1/2 inch needle was inserted into the Acromioclavicular joint under direct ultrasound visualization using linear transducer. After negative draw back for blood, a solution containing .5 mL of 6 mg per ML betamethasone and 1 mL of 1% lidocaine was injected. A band aid was applied. The patient tolerated the procedure well. Post procedure instructions were given.

## 2016-01-02 ENCOUNTER — Telehealth: Payer: Self-pay | Admitting: Neurology

## 2016-01-02 NOTE — Telephone Encounter (Signed)
I called the patient. She states when she woke up this morning she had a red place on her left eye. She states her eye feels funny but she does not have a headache. She denies any new/worsening numbness/weakness, changes in vision and facial droop. She does not have slurred speech or trouble finding words. She denies injuring her eye in any way.

## 2016-01-02 NOTE — Telephone Encounter (Signed)
The patient has what appears to be a scleral hemorrhage on the left eye. There are no changes in vision of the left eye, no eye pain. I indicated this is benign in nature, less there are vision changes, there is no reason to be concerned, if vision changes are noted, she is to see her ophthalmologist. The patient is on aspirin, she has been coughing a lot recently.

## 2016-01-02 NOTE — Telephone Encounter (Signed)
Patient is calling and states she is a stroke patient and when she got up this morning her left eye was red and has a dark red spot under her pupil.  She states she has no headache but just feels different.  She wants to know if she should come in.  Please call.

## 2016-01-17 ENCOUNTER — Other Ambulatory Visit: Payer: Self-pay | Admitting: Internal Medicine

## 2016-02-02 ENCOUNTER — Encounter: Payer: Medicare Other | Attending: Physical Medicine & Rehabilitation

## 2016-02-02 ENCOUNTER — Ambulatory Visit: Payer: Medicare PPO | Admitting: Occupational Therapy

## 2016-02-02 ENCOUNTER — Encounter: Payer: Self-pay | Admitting: Physical Medicine & Rehabilitation

## 2016-02-02 ENCOUNTER — Ambulatory Visit (HOSPITAL_BASED_OUTPATIENT_CLINIC_OR_DEPARTMENT_OTHER): Payer: Medicare Other | Admitting: Physical Medicine & Rehabilitation

## 2016-02-02 VITALS — BP 147/86 | HR 86

## 2016-02-02 DIAGNOSIS — R262 Difficulty in walking, not elsewhere classified: Secondary | ICD-10-CM | POA: Diagnosis not present

## 2016-02-02 DIAGNOSIS — G8111 Spastic hemiplegia affecting right dominant side: Secondary | ICD-10-CM | POA: Diagnosis present

## 2016-02-02 DIAGNOSIS — I69359 Hemiplegia and hemiparesis following cerebral infarction affecting unspecified side: Secondary | ICD-10-CM

## 2016-02-02 DIAGNOSIS — R279 Unspecified lack of coordination: Secondary | ICD-10-CM | POA: Diagnosis not present

## 2016-02-02 NOTE — Patient Instructions (Addendum)
Spinal Stenosis Spinal stenosis is an abnormal narrowing of the canals of your spine (vertebrae). CAUSES  Spinal stenosis is caused by areas of bone pushing into the central canals of your vertebrae. This condition can be present at birth (congenital). It also may be caused by arthritic deterioration of your vertebrae (spinal degeneration).  SYMPTOMS   Pain that is generally worse with activities, particularly standing and walking.  Numbness, tingling, hot or cold sensations, weakness, or weariness in your legs.  Frequent episodes of falling.  A foot-slapping gait that leads to muscle weakness. DIAGNOSIS  Spinal stenosis is diagnosed with the use of magnetic resonance imaging (MRI) or computed tomography (CT). TREATMENT  Initial therapy for spinal stenosis focuses on the management of the pain and other symptoms associated with the condition. These therapies include:  Practicing postural changes to lessen pressure on your nerves.  Exercises to strengthen the core of your body.  Loss of excess body weight.  The use of nonsteroidal anti-inflammatory medicines to reduce swelling and inflammation in your nerves. When therapies to manage pain are not successful, surgery to treat spinal stenosis may be recommended. This surgery involves removing excess bone, which puts pressure on your nerve roots. During this surgery (laminectomy), the posterior boney arch (lamina) and excess bone around the facet joints are removed.   This information is not intended to replace advice given to you by your health care provider. Make sure you discuss any questions you have with your health care provider.   Document Released: 01/25/2004 Document Revised: 11/25/2014 Document Reviewed: 02/12/2013 Elsevier Interactive Patient Education 2016 Else[employer] Inc[employer]   Please call if right thigh and leg pain worsen. We would need to check you out again and perhaps order either a CT scan of the lumbar spine or MRI.  Your  shoulder spasms may have been related to your cervical spinal stenosis which is worse on the right side and could pinch the nerves to the right arm

## 2016-02-02 NOTE — Progress Notes (Signed)
Subjective:    Patient ID: Teresa Martinez, female    DOB: 04/24/1941, 75 y.o.   MRN: [id number]  HPI 75 year old female with left pontine infarct 10/14/2014 with chronic right hemiparesis. She had a ultrasound guided AC joint injection 10/23/2015. About one week afterwards she started having shoulder spasms which persisted for a couple months.These have subsided. She also has right lateral knee grabbing pain this is worse with squatting. It does not bother her when she is walking or standing. Overall this is improving as well does not occur quite as frequently. Denies back pain Pain Inventory Average Pain 0 Pain Right Now 0 My pain is spasm  In the last 24 hours, has pain interfered with the following? General activity 0 Relation with others 0 Enjoyment of life 0 What TIME of day is your pain at its worst? varies Sleep (in general) NA  Pain is worse with: some activites Pain improves with: no pain but spasm Relief from Meds: 0  Mobility walk without assistance how many minutes can you walk? 60 ability to climb steps?  yes do you drive?  yes  Function retired  Neuro/Psych bladder control problems spasms  Prior Studies Any changes since last visit?  no  Physicians involved in your care Any changes since last visit?  no   Family History  Problem Relation Age of Onset  . Heart failure Father   . Diabetes Mother   . Hypertension Mother   . Stroke Mother   . Cirrhosis Sister   . Hyperlipidemia Brother   . Hypertension Brother   . Stroke Brother   . Diabetes Brother    Social History   Social History  . Marital Status: Married    Spouse Name: N/A  . Number of Children: 4  . Years of Education: N/A   Occupational History  . retired    Social History Main Topics  . Smoking status: Never Smoker   . Smokeless tobacco: Never Used  . Alcohol Use: No  . Drug Use: No  . Sexual Activity: Not Asked   Other Topics Concern  . None   Social History  Narrative   Regular exercise, 4 children, 6 grandchildren, one stepchild and two additional stepgrandchildren   Patient is right handed.   Patient doesn't drink caffeine.   Past Surgical History  Procedure Laterality Date  . Cholecystectomy      1950  . Band hemorrhoidectomy      2010  . Parotidectomy      30 years ago  . Abdominal hysterectomy      1985   Past Medical History  Diagnosis Date  . Hyperlipemia   . Statin intolerance   . Menopausal syndrome     [name]([place])  . Hypertension   . Overactive bladder   . Cervical spine degeneration     C5/C6  . Chronic right shoulder pain   . Hemiparesis affecting dominant side as late effect of cerebrovascular accident (HCC) 12/07/2014   BP 147/86 mmHg  Pulse 86  SpO2 97%  Opioid Risk Score:   Fall Risk Score:  `1  Depression screen PHQ 2/9  [facility] 08/28/2015 08/11/2015 02/06/2015 08/26/2014  Decreased Interest 0 0 0 0  Down, Depressed, Hopeless 0 0 0 0  PHQ - 2 Score 0 0 0 0  Altered sleeping - - 2 -  Tired, decreased energy - - 1 -  Change in appetite - - 0 -  Feeling bad or failure about yourself  - -  0 -  Trouble concentrating - - 0 -  Moving slowly or fidgety/restless - - 1 -  Suicidal thoughts - - 0 -  PHQ-9 Score - - 4 -     Review of Systems  All other systems reviewed and are negative.      Objective:   Physical Exam  Constitutional: She is oriented to person, place, and time. She appears well-developed and well-nourished.  HENT:  Head: Normocephalic and atraumatic.  Eyes: Conjunctivae and EOM are normal. Pupils are equal, round, and reactive to light.  Musculoskeletal:       Right shoulder: She exhibits decreased strength. She exhibits normal range of motion, no tenderness and no deformity.  Neurological: She is alert and oriented to person, place, and time. No sensory deficit. She exhibits abnormal muscle tone. Gait normal.  Reflex Scores:      Patellar reflexes are 3+ on the  right side and 2+ on the left side. Motor strength is 4+ in the right deltoid, biceps, triceps, grip 5/5 in the right hip flexor knee extensor and ankle dorsiflexor  Psychiatric: She has a normal mood and affect.  Nursing note and vitals reviewed.  Right knee joint normal no evidence of effusion full range of motion no erythema and no tenderness       Assessment & Plan:  1. Left pontine infarct remote with history of right hemiparesis. Overall she's made an excellent recovery she is exercising on a daily basis even doing some running. Her shoulder pain has largely resolved. The spasm she had in her shoulder on the right side will likely not related to her injection but instead may be related to her cervical stenosis.  2. Intermittent right lateral thigh pain extending to the right lateral leg. Does not appear to be in the knee joint.Her complaints may be more radicular in nature. She does have some pain that radiates into the Right thigh and leg with lumbar extension. We discussed checking MRI of the lumbar spine but she would like to hold off for now  Follow-up with me in 3 months she will call if she has some worsening of either her right shoulder pain or her right lower extremity pain. We may need to check a lumbar MRI as noted above.

## 2016-02-21 ENCOUNTER — Encounter: Payer: Self-pay | Admitting: Physical Medicine & Rehabilitation

## 2016-03-18 ENCOUNTER — Telehealth: Payer: Self-pay | Admitting: Internal Medicine

## 2016-03-18 NOTE — Telephone Encounter (Signed)
Please advise which medication pt should be taking?

## 2016-03-18 NOTE — Telephone Encounter (Signed)
Lisinopril 20, #90 one daily

## 2016-03-18 NOTE — Telephone Encounter (Signed)
Pt needs to know if she should continue to take lisinopril -hctz 20-12.5 mg or lisinopril 20 mg . Pt needs a refill [facility] [address]

## 2016-03-18 NOTE — Telephone Encounter (Signed)
Left message on voicemail to call office.  

## 2016-03-19 MED ORDER — LISINOPRIL 20 MG PO TABS: 20.0000 mg | ORAL_TABLET | Freq: Every day | ORAL | Status: DC

## 2016-03-19 NOTE — Telephone Encounter (Signed)
Spoke to pt, told her she is suppose to be taking Lisinopril 20 mg one tablet daily. Pt verbalized understanding and [facility] [address] Told pt okay Rx sent to pharmacy as requested. Pt verbalized understanding.

## 2016-03-19 NOTE — Addendum Note (Signed)
Addended by: Jimmye NormanPHANOS, Torina Ey J on: 03/19/2016 12:04 PM   Modules accepted: Orders

## 2016-04-23 ENCOUNTER — Ambulatory Visit (HOSPITAL_BASED_OUTPATIENT_CLINIC_OR_DEPARTMENT_OTHER): Payer: Medicare Other | Admitting: Physical Medicine & Rehabilitation

## 2016-04-23 ENCOUNTER — Encounter: Payer: Medicare Other | Attending: Physical Medicine & Rehabilitation

## 2016-04-23 ENCOUNTER — Telehealth: Payer: Self-pay | Admitting: Internal Medicine

## 2016-04-23 ENCOUNTER — Encounter: Payer: Self-pay | Admitting: Physical Medicine & Rehabilitation

## 2016-04-23 VITALS — BP 154/86 | HR 74 | Resp 14

## 2016-04-23 DIAGNOSIS — G8111 Spastic hemiplegia affecting right dominant side: Secondary | ICD-10-CM | POA: Diagnosis present

## 2016-04-23 DIAGNOSIS — I69359 Hemiplegia and hemiparesis following cerebral infarction affecting unspecified side: Secondary | ICD-10-CM | POA: Diagnosis not present

## 2016-04-23 DIAGNOSIS — M7631 Iliotibial band syndrome, right leg: Secondary | ICD-10-CM | POA: Diagnosis not present

## 2016-04-23 DIAGNOSIS — R279 Unspecified lack of coordination: Secondary | ICD-10-CM | POA: Insufficient documentation

## 2016-04-23 DIAGNOSIS — R262 Difficulty in walking, not elsewhere classified: Secondary | ICD-10-CM | POA: Insufficient documentation

## 2016-04-23 NOTE — Telephone Encounter (Signed)
Pt would like a call would not elaborate stated it is personal.

## 2016-04-23 NOTE — Patient Instructions (Signed)
Work on tiptoeing  Please sign up for my chart the instructions will be on the printout  You can email me next week and I can forward you any information about personal trainers that work specifically with stroke patients.

## 2016-04-23 NOTE — Progress Notes (Signed)
Subjective:    Patient ID: Teresa Martinez, female    DOB: 04/11/1941, 75 y.o.   MRN: [id number]  HPI 75 year old female with left pontine infarct onset in November 2015. She underwent inpatient rehabilitation followed by outpatient rehabilitation. She regained modified independent level with all her self-care and mobility. She is back to exercising however still notes some residual weakness in the right knee. In addition the patient feels some snapping and catching on the lateral aspect of the left knee. She does a home exercise program including single leg knee bends. She also does hip abduction exercises. She's had no falls or other new injuries.  Pain Inventory Average Pain 0 Pain Right Now 0 My pain is no pain  In the last 24 hours, has pain interfered with the following? General activity 0 Relation with others 0 Enjoyment of life 0 What TIME of day is your pain at its worst? no pain Sleep (in general) Fair  Pain is worse with: some activites Pain improves with: no pain Relief from Meds: no pain  Mobility walk without assistance how many minutes can you walk? 45 ability to climb steps?  yes do you drive?  yes transfers alone  Function retired  Neuro/Psych bladder control problems  Prior Studies Any changes since last visit?  no  Physicians involved in your care Any changes since last visit?  no   Family History  Problem Relation Age of Onset  . Heart failure Father   . Diabetes Mother   . Hypertension Mother   . Stroke Mother   . Cirrhosis Sister   . Hyperlipidemia Brother   . Hypertension Brother   . Stroke Brother   . Diabetes Brother    Social History   Social History  . Marital Status: Married    Spouse Name: N/A  . Number of Children: 4  . Years of Education: N/A   Occupational History  . retired    Social History Main Topics  . Smoking status: Never Smoker   . Smokeless tobacco: Never Used  . Alcohol Use: No  . Drug Use: No    . Sexual Activity: Not Asked   Other Topics Concern  . None   Social History Narrative   Regular exercise, 4 children, 6 grandchildren, one stepchild and two additional stepgrandchildren   Patient is right handed.   Patient doesn't drink caffeine.   Past Surgical History  Procedure Laterality Date  . Cholecystectomy      1950  . Band hemorrhoidectomy      2010  . Parotidectomy      30 years ago  . Abdominal hysterectomy      1985   Past Medical History  Diagnosis Date  . Hyperlipemia   . Statin intolerance   . Menopausal syndrome     [name]([place])  . Hypertension   . Overactive bladder   . Cervical spine degeneration     C5/C6  . Chronic right shoulder pain   . Hemiparesis affecting dominant side as late effect of cerebrovascular accident (HCC) 12/07/2014   BP 154/86 mmHg  Pulse 74  Resp 14  SpO2 97%  Opioid Risk Score:   Fall Risk Score:  `1  Depression screen PHQ 2/9  [facility] 08/28/2015 08/11/2015 02/06/2015 08/26/2014  Decreased Interest 0 0 0 0  Down, Depressed, Hopeless 0 0 0 0  PHQ - 2 Score 0 0 0 0  Altered sleeping - - 2 -  Tired, decreased energy - -  1 -  Change in appetite - - 0 -  Feeling bad or failure about yourself  - - 0 -  Trouble concentrating - - 0 -  Moving slowly or fidgety/restless - - 1 -  Suicidal thoughts - - 0 -  PHQ-9 Score - - 4 -     Review of Systems  All other systems reviewed and are negative.      Objective:   Physical Exam  Constitutional: She is oriented to person, place, and time. She appears well-developed and well-nourished.  HENT:  Head: Normocephalic and atraumatic.  Eyes: Conjunctivae and EOM are normal. Pupils are equal, round, and reactive to light.  Neck: Normal range of motion.  Musculoskeletal:       Right knee: She exhibits normal range of motion, no swelling, no effusion, no LCL laxity and no MCL laxity. No tenderness found.  Neurological: She is alert and oriented to person,  place, and time. No sensory deficit.  4+/5 strength in the right deltoid, biceps, triceps, grip, 5/5 and left side 4+ in the right hip flexor and extensor ankle dorsiflexor and plantar flexor 5 in the left side. Sensation equal and intact in both upper limbs and lower limbs.  Psychiatric: She has a normal mood and affect.  Nursing note and vitals reviewed.         Assessment & Plan:  1. History of left pontine infarct with mild residual right hemiparesis. This is mainly noticeable by the patient when she is exercising. No functional deficit at this point. We discussed that while she's had an excellent Recovery, she has not had a full recovery and that the residual weakness in the lower extremity may not improve over time. She would like a Systems analyst to work with her and will contact PT to see if they know of any personal trainers that have experience with stroke patients.  Return to clinic 4 months  2. Right acromioclavicular arthropathy no need for recurrent injection last injection approximately 3 months ago  3. Right lateral knee pain this goes down from her hip do not think this is radicular anymore since she has a catching sensation around the knee. 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 We'll give her hip extensor strengthening exercises as well as ITB stretching exercises.

## 2016-04-24 NOTE — Telephone Encounter (Signed)
Left message on voicemail to call office.  

## 2016-04-24 NOTE — Telephone Encounter (Signed)
Pt states her new insurance cost on  TOVIAZ 4 MG TB24 tablet is very expensive And would like to know what Sr K thought about her switching to  Qxybutytin  If ok, please call into [facility]/  [place] and [address]

## 2016-04-25 NOTE — Telephone Encounter (Signed)
Please see message and advise of okay to send new Rx?

## 2016-04-25 NOTE — Telephone Encounter (Signed)
Pt called back, told her Dr. Kirtland BouchardK is out of the office today and will be back tomorrow and I will have him change the medication. Pt verbalized understanding.

## 2016-04-26 MED ORDER — OXYBUTYNIN CHLORIDE ER 10 MG PO TB24: 10.0000 mg | ORAL_TABLET | Freq: Every day | ORAL | Status: DC

## 2016-04-26 NOTE — Telephone Encounter (Signed)
Okay oxybutynin extended release 10 mg  #60 one daily, refill times 2

## 2016-04-26 NOTE — Telephone Encounter (Signed)
Spoke to pt, told her Dr.K said okay to change from Toviaz to Oxybutynin 10 mg one tablet daily. Rx sent to pharmacy. Pt verbalized understanding.

## 2016-05-27 ENCOUNTER — Telehealth: Payer: Self-pay | Admitting: Internal Medicine

## 2016-05-27 DIAGNOSIS — E7800 Pure hypercholesterolemia, unspecified: Secondary | ICD-10-CM

## 2016-05-27 NOTE — Telephone Encounter (Signed)
Pt stopped taking her chole med on wed and would like to come in to check her chole. Pt was having side effect of chole med. Can I sch?

## 2016-05-27 NOTE — Telephone Encounter (Signed)
Pt has been sch

## 2016-05-27 NOTE — Telephone Encounter (Signed)
Okay to do a lipid profile, but would do fasting in approximately 2 weeks

## 2016-05-27 NOTE — Telephone Encounter (Signed)
Order for Lipid panel put in EPIC.

## 2016-05-27 NOTE — Addendum Note (Signed)
Addended by: Jimmye NormanPHANOS, Alanda Colton J on: 05/27/2016 01:01 PM   Modules accepted: Orders

## 2016-05-27 NOTE — Telephone Encounter (Signed)
Please see message and advise if Cholesterol can be checked?

## 2016-06-10 ENCOUNTER — Other Ambulatory Visit (INDEPENDENT_AMBULATORY_CARE_PROVIDER_SITE_OTHER): Payer: Medicare Other

## 2016-06-10 DIAGNOSIS — E7800 Pure hypercholesterolemia, unspecified: Secondary | ICD-10-CM

## 2016-06-10 LAB — LIPID PANEL
Cholesterol: 308 mg/dL — ABNORMAL HIGH (ref 0–200)
HDL: 57.6 mg/dL (ref >39.00)
LDL Cholesterol: 232 mg/dL — ABNORMAL HIGH (ref 0–99)
NonHDL: 250.71
Total CHOL/HDL Ratio: 5
Triglycerides: 93 mg/dL (ref 0.0–149.0)
VLDL: 18.6 mg/dL (ref 0.0–40.0)

## 2016-06-21 ENCOUNTER — Other Ambulatory Visit (INDEPENDENT_AMBULATORY_CARE_PROVIDER_SITE_OTHER): Payer: Medicare Other

## 2016-06-21 DIAGNOSIS — Z0000 Encounter for general adult medical examination without abnormal findings: Secondary | ICD-10-CM

## 2016-06-21 LAB — BASIC METABOLIC PANEL WITH GFR
BUN: 12 mg/dL (ref 6–23)
CO2: 30 meq/L (ref 19–32)
Calcium: 9.8 mg/dL (ref 8.4–10.5)
Chloride: 104 meq/L (ref 96–112)
Creatinine, Ser: 0.92 mg/dL (ref 0.40–1.20)
GFR: 76.11 mL/min
Glucose, Bld: 94 mg/dL (ref 70–99)
Potassium: 4.1 meq/L (ref 3.5–5.1)
Sodium: 141 meq/L (ref 135–145)

## 2016-06-21 LAB — LIPID PANEL
Cholesterol: 256 mg/dL — ABNORMAL HIGH (ref 0–200)
HDL: 52.6 mg/dL
LDL Cholesterol: 186 mg/dL — ABNORMAL HIGH (ref 0–99)
NonHDL: 203.1
Total CHOL/HDL Ratio: 5
Triglycerides: 84 mg/dL (ref 0.0–149.0)
VLDL: 16.8 mg/dL (ref 0.0–40.0)

## 2016-06-21 LAB — CBC WITH DIFFERENTIAL/PLATELET
Basophils Absolute: 0 10*3/uL (ref 0.0–0.1)
Basophils Relative: 0.4 % (ref 0.0–3.0)
Eosinophils Absolute: 0.2 10*3/uL (ref 0.0–0.7)
Eosinophils Relative: 3.9 % (ref 0.0–5.0)
HCT: 43 % (ref 36.0–46.0)
Hemoglobin: 14.3 g/dL (ref 12.0–15.0)
Lymphocytes Relative: 40.5 % (ref 12.0–46.0)
Lymphs Abs: 2.1 10*3/uL (ref 0.7–4.0)
MCHC: 33.3 g/dL (ref 30.0–36.0)
MCV: 80.7 fl (ref 78.0–100.0)
Monocytes Absolute: 0.5 10*3/uL (ref 0.1–1.0)
Monocytes Relative: 9.4 % (ref 3.0–12.0)
Neutro Abs: 2.3 10*3/uL (ref 1.4–7.7)
Neutrophils Relative %: 45.8 % (ref 43.0–77.0)
Platelets: 220 10*3/uL (ref 150.0–400.0)
RBC: 5.33 Mil/uL — ABNORMAL HIGH (ref 3.87–5.11)
RDW: 14.3 % (ref 11.5–15.5)
WBC: 5.1 10*3/uL (ref 4.0–10.5)

## 2016-06-21 LAB — POC URINALSYSI DIPSTICK (AUTOMATED)
Bilirubin, UA: NEGATIVE
Blood, UA: NEGATIVE
Glucose, UA: NEGATIVE
Ketones, UA: NEGATIVE
Nitrite, UA: NEGATIVE
Protein, UA: NEGATIVE
Spec Grav, UA: 1.015
Urobilinogen, UA: 0.2
pH, UA: 8

## 2016-06-21 LAB — HEPATIC FUNCTION PANEL
ALT: 13 U/L (ref 0–35)
AST: 21 U/L (ref 0–37)
Albumin: 4.3 g/dL (ref 3.5–5.2)
Alkaline Phosphatase: 51 U/L (ref 39–117)
Bilirubin, Direct: 0.1 mg/dL (ref 0.0–0.3)
Total Bilirubin: 0.4 mg/dL (ref 0.2–1.2)
Total Protein: 7 g/dL (ref 6.0–8.3)

## 2016-06-21 LAB — TSH: TSH: 1.06 u[IU]/mL (ref 0.35–4.50)

## 2016-06-28 ENCOUNTER — Encounter: Payer: Self-pay | Admitting: Internal Medicine

## 2016-06-28 ENCOUNTER — Ambulatory Visit (INDEPENDENT_AMBULATORY_CARE_PROVIDER_SITE_OTHER): Payer: Medicare Other | Admitting: Internal Medicine

## 2016-06-28 VITALS — BP 122/76 | HR 71 | Temp 98.5°F | Ht 65.0 in | Wt 130.0 lb

## 2016-06-28 DIAGNOSIS — I10 Essential (primary) hypertension: Secondary | ICD-10-CM

## 2016-06-28 DIAGNOSIS — I69359 Hemiplegia and hemiparesis following cerebral infarction affecting unspecified side: Secondary | ICD-10-CM | POA: Diagnosis not present

## 2016-06-28 DIAGNOSIS — I639 Cerebral infarction, unspecified: Secondary | ICD-10-CM

## 2016-06-28 DIAGNOSIS — I6350 Cerebral infarction due to unspecified occlusion or stenosis of unspecified cerebral artery: Secondary | ICD-10-CM | POA: Diagnosis not present

## 2016-06-28 DIAGNOSIS — Z0000 Encounter for general adult medical examination without abnormal findings: Secondary | ICD-10-CM | POA: Diagnosis not present

## 2016-06-28 DIAGNOSIS — E7800 Pure hypercholesterolemia, unspecified: Secondary | ICD-10-CM

## 2016-06-28 NOTE — Progress Notes (Signed)
Patient ID: Teresa Martinez, female   DOB: 1940/12/11, 75 y.o.   MRN: [id number] Patient ID: Teresa Martinez, female   DOB: 12/10/1940, 75 y.o.   MRN: [id number]  Subjective:    Patient ID: Teresa Martinez, female    DOB: 1941/06/09, 75 y.o.   MRN: [id number]  HPI  History of Present Illness:   75  year-old patient seen today for a comprehensive evaluation.  She is followed annually by gynecology. She has a history of hypertension overactive bladder and mild dyslipidemia. Her last colonoscopy was in 2014.  She was noted to have clonic polyps at that time and five-year interval recommended ; she has had a recent mammogram. She continues to be followed by gynecology. She is status post remote hysterectomy. Her chronic neck pain for several years duration is improved.  She presently is Not on Crestor.  She has tolerated other statins poorly in the past.  She did well on Crestor.  3 times weekly for some time.  The discontinued last month due to increasing muscle pain and weakness.  She is followed by neurology.  Has had a recent mammogram.    PMH 75 y.o. RH-female with history of HTN, hyperlipidemia who was admitted on 10/14/14 with acute onset of slurred speech and right sided weakness. Patient with sputtering symptoms and was treated with tPA due to recurrent nature of symptoms. CTA head/neck with 50% stenosis of R-ICA with luminal irregularity of bilateral ICA question fibromuscular dysplasia, severe calcific atherosclerosis of the carotid siphon and mild luminal irregularity of VA with extrinsic compression due to cervical spine degenerative changes. MRI/MRA brain done revealing acute left pontine infarct with lobular mass right parotid gland. CTNeck revealed right parotid mass and two right thyroid nodules without adenopathy. Patient was started on ASA for secondary stroke prevention.  Here for Medicare AWV:   1. Risk factors based on Past M, S, F history: cardiovascular  risk factors include hypertension, and dyslipidemia.  2. Physical Activities: Limited due to cerebrovascular disease and mild hemiparesis affecting dominant side 3. Depression/mood: no history of depression or mood disorder  4. Hearing: no deficits  5. ADL's: completely active and independent in all aspects of daily living  6. Fall Risk: Moderate to high due to cerebrovascular disease 7. Home Safety: no problems identifying  8. Height, weight, &visual acuity:height and weight stable. No difficulty with her visual acuity wears glasses  9. Counseling: Heart healthy diet.  Encouraged 10. Labs ordered based on risk factors: laboratory profile, including lipid panel will be reviewed  11. Referral Coordination- GYN referral and neurology 12. Care Plan- heart healthy diet more regular exercise regimen encouraged  13. Cognitive Assessment- alert and oriented with normal affect. No mood disorder, handles all her daily affairs, and no difficulty with executive function or memory. No functional deficits 14.  Preventive services.  The patient will continue to have preventive  examination from gynecology and primary care.  She does have annual mammograms.  She has had a recent colonoscopy within the past 2 years 15.  Provider list- includes GI.  Gynecology radiology and primary care as well as neurology.  Patient was provided with a written and personalized care plan   Current Medications (verified):  1) Lisinopril-Hydrochlorothiazide 20-12.5 Mg Tabs (Lisinopril-Hydrochlorothiazide) .... One Daily  2) Toviaz 4 Mg Xr24h-Tab (Fesoterodine Fumarate) .... One Daily, As Needed   Allergies (verified):  1) ! Latex Exam Gloves (Disposable Gloves)   Past History:  Past Medical History:   Hyperlipidemia  menopausal syndrome [name]( Sherry [facility]stein)  hypertension  overactive bladder  Past Surgical History:   Cholecystectomy 1950  Hemorrhoidectomy -band ligation 2010  status post parotidectomy 30 years ago   Colonoscopy 2004 2014  Hysterectomy 1985   Family History:   father died age 77. History congestive heart failure  mother died age 77. History diabetes, hypertension, cerebrovascular disease  Two sisters, one died from nonalcoholic cirrhosis  two brothers, one with a hypercholesterolemia, and hypertension; s/p CVA    Review of Systems  Constitutional: Negative for appetite change, fatigue, fever and unexpected weight change.  HENT: Negative for congestion, dental problem, ear pain, hearing loss, mouth sores, nosebleeds, sinus pressure, sore throat, tinnitus, trouble swallowing and voice change.   Eyes: Negative for photophobia, pain, redness and visual disturbance.  Respiratory: Negative for cough, chest tightness and shortness of breath.   Cardiovascular: Negative for chest pain, palpitations and leg swelling.  Gastrointestinal: Negative for abdominal distention, abdominal pain, blood in stool, constipation, diarrhea, nausea, rectal pain and vomiting.  Genitourinary: Negative for difficulty urinating, dysuria, flank pain, frequency, genital sores, hematuria, menstrual problem, pelvic pain, urgency, vaginal bleeding, vaginal discharge and vaginal pain.  Musculoskeletal: Negative for arthralgias, back pain and neck stiffness.       Chronic posterior neck pain that interferes with sleep  Skin: Negative for rash.  Neurological: Negative for dizziness, syncope, speech difficulty, weakness, light-headedness, numbness and headaches.  Hematological: Negative for adenopathy. Does not bruise/bleed easily.  Psychiatric/Behavioral: Negative for agitation, behavioral problems, dysphoric mood, self-injury and suicidal ideas. The patient is not nervous/anxious.        Objective:   Physical Exam  Constitutional: She is oriented to person, place, and time. She appears well-developed and well-nourished.  HENT:  Head: Normocephalic and atraumatic.  Right Ear: External ear normal.  Left Ear:  External ear normal.  Mouth/Throat: Oropharynx is clear and moist.  Eyes: Conjunctivae and EOM are normal.  Neck: Normal range of motion. Neck supple. No JVD present. No thyromegaly present.  Cardiovascular: Normal rate, regular rhythm, normal heart sounds and intact distal pulses.   No murmur heard. Dorsalis pedis pulses faint  Pulmonary/Chest: Effort normal and breath sounds normal. She has no wheezes. She has no rales.  Abdominal: Soft. Bowel sounds are normal. She exhibits no distension and no mass. There is no tenderness. There is no rebound and no guarding.  Musculoskeletal: Normal range of motion. She exhibits no edema or tenderness.  Neurological: She is alert and oriented to person, place, and time. She has normal reflexes. No cranial nerve deficit. She exhibits normal muscle tone. Coordination normal.  Mild impairment.  Finger to nose and heel to shin testing on the right Mild right side hyperreflexia  No drift  Skin: Skin is warm and dry. No rash noted.  Psychiatric: She has a normal mood and affect. Her behavior is normal.          Assessment & Plan:   Preventive health care DyslipidemiStatin intolerance Hypertension well controlled Cerebrovascular disease.  Status post left brain stroke.will continue daily aspirin and heart healthy diet   Laboratory studies to be updated Recheck 6 months Exercise low salt diet encouraged   Rogelia BogaKWIATKOWSKI,PETER FRANK, MD

## 2016-06-28 NOTE — Progress Notes (Signed)
Pre visit review using our clinic review tool, if applicable. No additional management support is needed unless otherwise documented below in the visit note. 

## 2016-06-28 NOTE — Patient Instructions (Signed)
Limit your sodium (Salt) intake  Please check your blood pressure on a regular basis.  If it is consistently greater than 150/90, please make an office appointment.    It is important that you exercise regularly, at least 20 minutes 3 to 4 times per week.  If you develop chest pain or shortness of breath seek  medical attention.  Take a calcium supplement, plus 800-1200 units of vitamin D  Return in one year for follow-up  

## 2016-08-23 ENCOUNTER — Ambulatory Visit: Payer: Medicare Other | Admitting: Physical Medicine & Rehabilitation

## 2016-08-29 ENCOUNTER — Encounter: Payer: Self-pay | Admitting: Physical Medicine & Rehabilitation

## 2016-08-29 ENCOUNTER — Ambulatory Visit (HOSPITAL_BASED_OUTPATIENT_CLINIC_OR_DEPARTMENT_OTHER): Payer: Medicare Other | Admitting: Physical Medicine & Rehabilitation

## 2016-08-29 ENCOUNTER — Encounter: Payer: Medicare Other | Attending: Physical Medicine & Rehabilitation

## 2016-08-29 VITALS — BP 136/83 | HR 71 | Resp 14

## 2016-08-29 DIAGNOSIS — I69359 Hemiplegia and hemiparesis following cerebral infarction affecting unspecified side: Secondary | ICD-10-CM | POA: Diagnosis present

## 2016-08-29 DIAGNOSIS — I10 Essential (primary) hypertension: Secondary | ICD-10-CM | POA: Diagnosis not present

## 2016-08-29 DIAGNOSIS — Z8249 Family history of ischemic heart disease and other diseases of the circulatory system: Secondary | ICD-10-CM | POA: Insufficient documentation

## 2016-08-29 DIAGNOSIS — X58XXXA Exposure to other specified factors, initial encounter: Secondary | ICD-10-CM | POA: Insufficient documentation

## 2016-08-29 DIAGNOSIS — T466X1A Poisoning by antihyperlipidemic and antiarteriosclerotic drugs, accidental (unintentional), initial encounter: Secondary | ICD-10-CM | POA: Diagnosis not present

## 2016-08-29 DIAGNOSIS — Z90710 Acquired absence of both cervix and uterus: Secondary | ICD-10-CM | POA: Insufficient documentation

## 2016-08-29 DIAGNOSIS — G720 Drug-induced myopathy: Secondary | ICD-10-CM

## 2016-08-29 DIAGNOSIS — Z823 Family history of stroke: Secondary | ICD-10-CM | POA: Insufficient documentation

## 2016-08-29 DIAGNOSIS — Z9049 Acquired absence of other specified parts of digestive tract: Secondary | ICD-10-CM | POA: Diagnosis not present

## 2016-08-29 DIAGNOSIS — E785 Hyperlipidemia, unspecified: Secondary | ICD-10-CM | POA: Insufficient documentation

## 2016-08-29 DIAGNOSIS — T466X5A Adverse effect of antihyperlipidemic and antiarteriosclerotic drugs, initial encounter: Secondary | ICD-10-CM

## 2016-08-29 NOTE — Progress Notes (Signed)
Subjective:    Patient ID: Teresa Martinez, female    DOB: 10/20/1941, 75 y.o.   MRN: [id number]  HPI  Patient returns today complaining of increased weakness in both lower limbs . This started sometime in July and she associates this with Crestor. She has had elevated cholesterol levels and has been tried on several statins. She has been intolerant of other statins due to leg pain. I do not see a CK. Patient states that she remains independent with all her mobility, but her exercise tolerance has been reduced. She normally works out on a regular basis  Pain Inventory Average Pain 1 Pain Right Now 1 My pain is aching  In the last 24 hours, has pain interfered with the following? General activity 2 Relation with others 2 Enjoyment of life 2 What TIME of day is your pain at its worst? daytime Sleep (in general) Good  Pain is worse with: walking and bending Pain improves with: rest Relief from Meds: 7  Mobility walk without assistance how many minutes can you walk? 30 minutes do you drive?  yes Do you have any goals in this area?  yes  Function disabled: date disabled 11/15 I need assistance with the following:  household duties  Neuro/Psych bladder control problems  Prior Studies Any changes since last visit?  no  Physicians involved in your care Any changes since last visit?  no   Family History  Problem Relation Age of Onset  . Heart failure Father   . Diabetes Mother   . Hypertension Mother   . Stroke Mother   . Cirrhosis Sister   . Hyperlipidemia Brother   . Hypertension Brother   . Stroke Brother   . Diabetes Brother    Social History   Social History  . Marital status: Married    Spouse name: N/A  . Number of children: 4  . Years of education: N/A   Occupational History  . retired    Social History Main Topics  . Smoking status: Never Smoker  . Smokeless tobacco: Never Used  . Alcohol use No  . Drug use: No  . Sexual activity: Not  Asked   Other Topics Concern  . None   Social History Narrative   Regular exercise, 4 children, 6 grandchildren, one stepchild and two additional stepgrandchildren   Patient is right handed.   Patient doesn't drink caffeine.   Past Surgical History:  Procedure Laterality Date  . ABDOMINAL HYSTERECTOMY     1985  . BAND HEMORRHOIDECTOMY     2010  . CHOLECYSTECTOMY     1950  . PAROTIDECTOMY     30 years ago   Past Medical History:  Diagnosis Date  . Cervical spine degeneration    C5/C6  . Chronic right shoulder pain   . Hemiparesis affecting dominant side as late effect of cerebrovascular accident (HCC) 12/07/2014  . Hyperlipemia   . Hypertension   . Menopausal syndrome    Teresa Pen(Sherry RamosDickstein)  . Overactive bladder   . Statin intolerance    BP (!) 144/81   Pulse 74   Resp 14   SpO2 93%   Opioid Risk Score:   Fall Risk Score:  `1  Depression screen PHQ 2/9  [facility] 08/28/2015 08/11/2015 02/06/2015 08/26/2014  Decreased Interest 0 0 0 0  Down, Depressed, Hopeless 0 0 0 0  PHQ - 2 Score 0 0 0 0  Altered sleeping - - 2 -  Tired, decreased energy - -  1 -  Change in appetite - - 0 -  Feeling bad or failure about yourself  - - 0 -  Trouble concentrating - - 0 -  Moving slowly or fidgety/restless - - 1 -  Suicidal thoughts - - 0 -  PHQ-9 Score - - 4 -      Review of Systems  Constitutional: Positive for unexpected weight change.  Gastrointestinal: Positive for constipation.  All other systems reviewed and are negative.      Objective:   Physical Exam  Constitutional: She is oriented to person, place, and time. She appears well-developed and well-nourished.  HENT:  Head: Normocephalic and atraumatic.  Eyes: Conjunctivae and EOM are normal. Pupils are equal, round, and reactive to light.  Neck: Normal range of motion.  Musculoskeletal:  No tenderness to palpation in the quadriceps area or the gluteal area. Her motor strength is 4 bilateral  hip abductors, 5 bilateral hip flexors, knee extensors, ankle dorsiflexors. There is no evidence of muscle atrophy. Negative straight leg raising test  Neurological: She is alert and oriented to person, place, and time.  Psychiatric: She has a normal mood and affect.  Nursing note and vitals reviewed.         Assessment & Plan:  1. History of CVA with right hemiparesis. Her main residual is right upper limb weakness. She has no current shoulder pain. 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 I recommended: 7 every 10 50-100 mg per day. Would get this at least one month to see if it is helpful. In addition, would continue her home exercise program. She should focus on her hip abductor strength. She may add ankle weights for her side leg raises.  Return to clinic in 6-8 weeks

## 2016-08-29 NOTE — Patient Instructions (Signed)
Try Coenzyme Q10 50-100mg  per day.

## 2016-09-02 ENCOUNTER — Ambulatory Visit: Payer: Medicare Other | Admitting: Physical Medicine & Rehabilitation

## 2016-10-29 ENCOUNTER — Encounter: Payer: Self-pay | Admitting: Physical Medicine & Rehabilitation

## 2016-10-29 ENCOUNTER — Other Ambulatory Visit: Payer: Self-pay | Admitting: Internal Medicine

## 2016-10-29 ENCOUNTER — Ambulatory Visit (HOSPITAL_BASED_OUTPATIENT_CLINIC_OR_DEPARTMENT_OTHER): Payer: Medicare Other | Admitting: Physical Medicine & Rehabilitation

## 2016-10-29 ENCOUNTER — Encounter: Payer: Medicare Other | Attending: Physical Medicine & Rehabilitation

## 2016-10-29 VITALS — BP 124/81 | HR 75 | Resp 14

## 2016-10-29 DIAGNOSIS — E785 Hyperlipidemia, unspecified: Secondary | ICD-10-CM | POA: Insufficient documentation

## 2016-10-29 DIAGNOSIS — Z9049 Acquired absence of other specified parts of digestive tract: Secondary | ICD-10-CM | POA: Diagnosis not present

## 2016-10-29 DIAGNOSIS — I69359 Hemiplegia and hemiparesis following cerebral infarction affecting unspecified side: Secondary | ICD-10-CM

## 2016-10-29 DIAGNOSIS — Z823 Family history of stroke: Secondary | ICD-10-CM | POA: Insufficient documentation

## 2016-10-29 DIAGNOSIS — M7631 Iliotibial band syndrome, right leg: Secondary | ICD-10-CM | POA: Diagnosis not present

## 2016-10-29 DIAGNOSIS — Z90710 Acquired absence of both cervix and uterus: Secondary | ICD-10-CM | POA: Diagnosis not present

## 2016-10-29 DIAGNOSIS — G720 Drug-induced myopathy: Secondary | ICD-10-CM

## 2016-10-29 DIAGNOSIS — T466X1A Poisoning by antihyperlipidemic and antiarteriosclerotic drugs, accidental (unintentional), initial encounter: Secondary | ICD-10-CM | POA: Diagnosis not present

## 2016-10-29 DIAGNOSIS — I10 Essential (primary) hypertension: Secondary | ICD-10-CM | POA: Insufficient documentation

## 2016-10-29 DIAGNOSIS — T466X5A Adverse effect of antihyperlipidemic and antiarteriosclerotic drugs, initial encounter: Secondary | ICD-10-CM

## 2016-10-29 DIAGNOSIS — Z8249 Family history of ischemic heart disease and other diseases of the circulatory system: Secondary | ICD-10-CM | POA: Insufficient documentation

## 2016-10-29 NOTE — Progress Notes (Signed)
Subjective:    Patient ID: Teresa Martinez, female    DOB: 06/13/1941, 75 y.o.   MRN: [id number]  HPI  Still trying to regain strength after Crestor d/ced, making slow progress with workouts  Right lower extremity pain with certain movements, outside of thigh, this started about 1 year after stroke, leaning forward  No numbness or tingling, , tends to occur when she gets up from a seated position No progressive weakness of leg No back pain, no hip pain.  Pain feels like it is in back of R knee  Pain Inventory Average Pain 3 Pain Right Now 2 My pain is not answered  In the last 24 hours, has pain interfered with the following? General activity 0 Relation with others 0 Enjoyment of life 0 What TIME of day is your pain at its worst? morning Sleep (in general) NA  Pain is worse with: no answer Pain improves with: no answer Relief from Meds: no answer  Mobility walk without assistance how many minutes can you walk? 30 ability to climb steps?  yes do you drive?  yes  Function Do you have any goals in this area?  yes  Neuro/Psych bladder control problems weakness numbness  Prior Studies Any changes since last visit?  no  Physicians involved in your care Any changes since last visit?  no   Family History  Problem Relation Age of Onset  . Heart failure Father   . Diabetes Mother   . Hypertension Mother   . Stroke Mother   . Cirrhosis Sister   . Hyperlipidemia Brother   . Hypertension Brother   . Stroke Brother   . Diabetes Brother    Social History   Social History  . Marital status: Married    Spouse name: N/A  . Number of children: 4  . Years of education: N/A   Occupational History  . retired    Social History Main Topics  . Smoking status: Never Smoker  . Smokeless tobacco: Never Used  . Alcohol use No  . Drug use: No  . Sexual activity: Not Asked   Other Topics Concern  . None   Social History Narrative   Regular exercise, 4  children, 6 grandchildren, one stepchild and two additional stepgrandchildren   Patient is right handed.   Patient doesn't drink caffeine.   Past Surgical History:  Procedure Laterality Date  . ABDOMINAL HYSTERECTOMY     1985  . BAND HEMORRHOIDECTOMY     2010  . CHOLECYSTECTOMY     1950  . PAROTIDECTOMY     30 years ago   Past Medical History:  Diagnosis Date  . Cervical spine degeneration    C5/C6  . Chronic right shoulder pain   . Hemiparesis affecting dominant side as late effect of cerebrovascular accident (HCC) 12/07/2014  . Hyperlipemia   . Hypertension   . Menopausal syndrome    [name](Sherry [place]stein)  . Overactive bladder   . Statin intolerance    BP 124/81   Pulse 75   Resp 14   SpO2 95%   Opioid Risk Score:   Fall Risk Score:  `1  Depression screen PHQ 2/9  Depression screen [facility] 08/28/2015 08/11/2015 02/06/2015 08/26/2014  Decreased Interest 0 0 0 0  Down, Depressed, Hopeless 0 0 0 0  PHQ - 2 Score 0 0 0 0  Altered sleeping - - 2 -  Tired, decreased energy - - 1 -  Change in appetite - - 0 -  Feeling bad or failure about yourself  - - 0 -  Trouble concentrating - - 0 -  Moving slowly or fidgety/restless - - 1 -  Suicidal thoughts - - 0 -  PHQ-9 Score - - 4 -     Review of Systems  Constitutional: Negative.   HENT: Negative.   Eyes: Negative.   Respiratory: Negative.   Cardiovascular: Negative.   Gastrointestinal: Negative.   Endocrine: Negative.   Genitourinary: Negative.   Musculoskeletal: Negative.   Skin: Negative.   Allergic/Immunologic: Negative.   Neurological: Negative.   Hematological: Negative.   Psychiatric/Behavioral: Negative.   All other systems reviewed and are negative.      Objective:   Physical Exam  Constitutional: She is oriented to person, place, and time. She appears well-developed and well-nourished.  HENT:  Head: Normocephalic and atraumatic.  Eyes: Conjunctivae and EOM are normal. Pupils are equal, round, and  reactive to light.  Neck: Normal range of motion.  Musculoskeletal:       Right knee: She exhibits no effusion, no ecchymosis, no deformity, normal alignment and no LCL laxity. No tenderness found. No patellar tendon tenderness noted.  Neurological: She is alert and oriented to person, place, and time.  Skin: Skin is warm and dry.  Psychiatric: She has a normal mood and affect. Her behavior is normal. Judgment and thought content normal.  Nursing note and vitals reviewed.   RIght knee withoutevidence of effusion, no tenderness alon the joint lin there is tenderness at the lateral hamstrings along the attachment to the fibula - SLR Normal sensation BLE      Assessment & Plan:  1.  RLE pain, iliotibial band syndrome, have offered PT referral, pt declines, have printed exercises Do not see clinical evidence of lumbar radic, no back pain  2.  Right hemiparesis , chronic rec cont community exercise program, had a set back with statin myopathy but is slowly improving no formal therapy needed at this time

## 2016-10-29 NOTE — Patient Instructions (Signed)
Iliotibial Band Syndrome Rehab  Ask your health care provider which exercises are safe for you. Do exercises exactly as told by your health care provider and adjust them as directed. It is normal to feel mild stretching, pulling, tightness, or discomfort as you do these exercises, but you should stop right away if you feel sudden pain or your pain gets worse. Do not begin these exercises until told by your health care provider.  Stretching and range of motion exercises  These exercises warm up your muscles and joints and improve the movement and flexibility of your hip and pelvis.  Exercise A: Quadriceps, prone     1. Lie on your abdomen on a firm surface, such as a bed or padded floor.  2. Bend your left / right knee and hold your ankle. If you cannot reach your ankle or pant leg, loop a belt around your foot and grab the belt instead.  3. Gently pull your heel toward your buttocks. Your knee should not slide out to the side. You should feel a stretch in the front of your thigh and knee.  4. Hold this position for __________ seconds.  Repeat __________ times. Complete this stretch __________ times a day.  Exercise B: Iliotibial band     1. Lie on your side with your left / right leg in the top position.  2. Bend both of your knees and grab your left / right ankle. Stretch out your bottom arm to help you balance.  3. Slowly bring your top knee back so your thigh goes behind your trunk.  4. Slowly lower your top leg toward the floor until you feel a gentle stretch on the outside of your left / right hip and thigh. If you do not feel a stretch and your knee will not fall farther, place the heel of your other foot on top of your knee and pull your knee down toward the floor with your foot.  5. Hold this position for __________ seconds.  Repeat __________ times. Complete this stretch __________ times a day.  Strengthening exercises  These exercises build strength and endurance in your hip and pelvis. Endurance is the  ability to use your muscles for a long time, even after they get tired.  Exercise C: Straight leg raises (   hip abductors)  1. Lie on your side with your left / right leg in the top position. Lie so your head, shoulder, knee, and hip line up. You may bend your bottom knee to help you balance.  2. Roll your hips slightly forward so your hips are stacked directly over each other and your left / right knee is facing forward.  3. Tense the muscles in your outer thigh and lift your top leg 4-6 inches (10-15 cm).  4. Hold this position for __________ seconds.  5. Slowly return to the starting position. Let your muscles relax completely before doing another repetition.  Repeat __________ times. Complete this exercise __________ times a day.  Exercise D: Straight leg raises (  hip extensors)  1. Lie on your abdomen on your bed or a firm surface. You can put a pillow under your hips if that is more comfortable.  2. Bend your left / right knee so your foot is straight up in the air.  3. Squeeze your buttock muscles and lift your left / right thigh off the bed. Do not let your back arch.  4. Tense this muscle as hard as you can without increasing any   knee pain.  5. Hold this position for __________ seconds.  6. Slowly lower your leg to the starting position and allow it to relax completely.  Repeat __________ times. Complete this exercise __________ times a day.  Exercise E: Hip hike  1. Stand sideways on a bottom step. Stand on your left / right leg with your other foot unsupported next to the step. You can hold onto the railing or wall if needed for balance.  2. Keep your knees straight and your torso square. Then, lift your left / right hip up toward the ceiling.  3. Slowly let your left / right hip lower toward the floor, past the starting position. Your foot should get closer to the floor. Do not lean or bend your knees.  Repeat __________ times. Complete this exercise __________ times a day.  This information is not  intended to replace advice given to you by your health care provider. Make sure you discuss any questions you have with your health care provider.  Document Released: 11/04/2005 Document Revised: 07/09/2016 Document Reviewed: 10/06/2015  [employer]vier Interactive Patient Education © 2017 [employer]

## 2016-11-21 ENCOUNTER — Other Ambulatory Visit: Payer: Self-pay | Admitting: Physical Medicine & Rehabilitation

## 2016-11-21 ENCOUNTER — Telehealth: Payer: Self-pay

## 2016-11-21 NOTE — Telephone Encounter (Signed)
May refill. Voltaren gel

## 2016-11-21 NOTE — Telephone Encounter (Signed)
Request of a refill for prescribed medication, Voltaren gel, did not see in note if she could continue medications, please advise.

## 2017-02-17 ENCOUNTER — Ambulatory Visit (INDEPENDENT_AMBULATORY_CARE_PROVIDER_SITE_OTHER): Payer: Medicare Other | Admitting: Internal Medicine

## 2017-02-17 ENCOUNTER — Encounter: Payer: Self-pay | Admitting: Internal Medicine

## 2017-02-17 VITALS — BP 168/82 | HR 72 | Temp 97.9°F | Ht 65.0 in | Wt 133.8 lb

## 2017-02-17 DIAGNOSIS — T466X1A Poisoning by antihyperlipidemic and antiarteriosclerotic drugs, accidental (unintentional), initial encounter: Secondary | ICD-10-CM | POA: Diagnosis not present

## 2017-02-17 DIAGNOSIS — T466X5A Adverse effect of antihyperlipidemic and antiarteriosclerotic drugs, initial encounter: Secondary | ICD-10-CM

## 2017-02-17 DIAGNOSIS — I6350 Cerebral infarction due to unspecified occlusion or stenosis of unspecified cerebral artery: Secondary | ICD-10-CM | POA: Diagnosis not present

## 2017-02-17 DIAGNOSIS — G720 Drug-induced myopathy: Secondary | ICD-10-CM | POA: Diagnosis not present

## 2017-02-17 DIAGNOSIS — I69359 Hemiplegia and hemiparesis following cerebral infarction affecting unspecified side: Secondary | ICD-10-CM | POA: Diagnosis not present

## 2017-02-17 DIAGNOSIS — I10 Essential (primary) hypertension: Secondary | ICD-10-CM | POA: Diagnosis not present

## 2017-02-17 DIAGNOSIS — I639 Cerebral infarction, unspecified: Secondary | ICD-10-CM

## 2017-02-17 DIAGNOSIS — E7800 Pure hypercholesterolemia, unspecified: Secondary | ICD-10-CM

## 2017-02-17 LAB — COMPREHENSIVE METABOLIC PANEL
ALT: 12 U/L (ref 0–35)
AST: 19 U/L (ref 0–37)
Albumin: 4.5 g/dL (ref 3.5–5.2)
Alkaline Phosphatase: 57 U/L (ref 39–117)
BUN: 17 mg/dL (ref 6–23)
CO2: 31 mEq/L (ref 19–32)
Calcium: 9.8 mg/dL (ref 8.4–10.5)
Chloride: 105 mEq/L (ref 96–112)
Creatinine, Ser: 0.83 mg/dL (ref 0.40–1.20)
GFR: 85.56 mL/min (ref >60.00)
Glucose, Bld: 86 mg/dL (ref 70–99)
Potassium: 4.1 mEq/L (ref 3.5–5.1)
Sodium: 142 mEq/L (ref 135–145)
Total Bilirubin: 0.4 mg/dL (ref 0.2–1.2)
Total Protein: 6.8 g/dL (ref 6.0–8.3)

## 2017-02-17 LAB — CBC WITH DIFFERENTIAL/PLATELET
Basophils Absolute: 0 10*3/uL (ref 0.0–0.1)
Basophils Relative: 0.4 % (ref 0.0–3.0)
Eosinophils Absolute: 0.2 10*3/uL (ref 0.0–0.7)
Eosinophils Relative: 2.5 % (ref 0.0–5.0)
HCT: 42.6 % (ref 36.0–46.0)
Hemoglobin: 13.9 g/dL (ref 12.0–15.0)
Lymphocytes Relative: 25.9 % (ref 12.0–46.0)
Lymphs Abs: 1.7 10*3/uL (ref 0.7–4.0)
MCHC: 32.6 g/dL (ref 30.0–36.0)
MCV: 81.1 fl (ref 78.0–100.0)
Monocytes Absolute: 0.5 10*3/uL (ref 0.1–1.0)
Monocytes Relative: 7.8 % (ref 3.0–12.0)
Neutro Abs: 4.2 10*3/uL (ref 1.4–7.7)
Neutrophils Relative %: 63.4 % (ref 43.0–77.0)
Platelets: 240 10*3/uL (ref 150.0–400.0)
RBC: 5.25 Mil/uL — ABNORMAL HIGH (ref 3.87–5.11)
RDW: 14.2 % (ref 11.5–15.5)
WBC: 6.7 10*3/uL (ref 4.0–10.5)

## 2017-02-17 LAB — CK: Total CK: 133 U/L (ref 7–177)

## 2017-02-17 LAB — SEDIMENTATION RATE: Sed Rate: 8 mm/hr (ref 0–30)

## 2017-02-17 LAB — TSH: TSH: 0.61 u[IU]/mL (ref 0.35–4.50)

## 2017-02-17 MED ORDER — EZETIMIBE 10 MG PO TABS: 10.0000 mg | ORAL_TABLET | Freq: Every day | ORAL | 2 refills | Status: DC

## 2017-02-17 NOTE — Progress Notes (Signed)
Subjective:    Patient ID: Teresa Martinez, female    DOB: 1941-07-27, 76 y.o.   MRN: [id number]  HPI  76 year old patient who has a history of cerebrovascular disease affecting her dominant side.  She also has a history of statin intolerance. She complains of generalized stiffness which is aggravated by prolonged in motility.  It is worse in the morning and after prolonged sitting. She is concerned about residual side effects from Crestor, which was discontinued last year. She has taken naproxen for some right shoulder pain which did not seem to affect the stiffness.  She continues to perform rehabilitation exercises 6 times weekly Since her last visit here, she has had 2 falls, one when she slipped on some ice and another when her foot got stuck in pants while dressing in the bathroom.  She has essential hypertension was generally has done well but a bit elevated today  Past Medical History:  Diagnosis Date  . Cervical spine degeneration    C5/C6  . Chronic right shoulder pain   . Hemiparesis affecting dominant side as late effect of cerebrovascular accident (HCC) 12/07/2014  . Hyperlipemia   . Hypertension   . Menopausal syndrome    [name])  . Overactive bladder   . Statin intolerance      Social History   Social History  . Marital status: Married    Spouse name: N/A  . Number of children: 4  . Years of education: N/A   Occupational History  . retired    Social History Main Topics  . Smoking status: Never Smoker  . Smokeless tobacco: Never Used  . Alcohol use No  . Drug use: No  . Sexual activity: Not on file   Other Topics Concern  . Not on file   Social History Narrative   Regular exercise, 4 children, 6 grandchildren, one stepchild and two additional stepgrandchildren   Patient is right handed.   Patient doesn't drink caffeine.    Past Surgical History:  Procedure Laterality Date  . ABDOMINAL HYSTERECTOMY     1985  . BAND  HEMORRHOIDECTOMY     2010  . CHOLECYSTECTOMY     1950  . PAROTIDECTOMY     30 years ago    Family History  Problem Relation Age of Onset  . Heart failure Father   . Diabetes Mother   . Hypertension Mother   . Stroke Mother   . Cirrhosis Sister   . Hyperlipidemia Brother   . Hypertension Brother   . Stroke Brother   . Diabetes Brother     Allergies  Allergen Reactions  . Latex Other (See Comments)    Burns her skin  . Pravastatin Other (See Comments)    Leg pains    Current Outpatient Prescriptions on File Prior to Visit  Medication Sig Dispense Refill  . aspirin EC 325 MG EC tablet Take 1 tablet (325 mg total) by mouth daily. 100 tablet 1  . ESTRACE VAGINAL 0.1 MG/GM vaginal cream INSERT 1 GRAM VAGINALLY TWO TIMES A WEEK  3  . lisinopril (PRINIVIL,ZESTRIL) 20 MG tablet Take 1 tablet (20 mg total) by mouth daily. 90 tablet 3  . oxybutynin (DITROPAN-XL) 10 MG 24 hr tablet TAKE 1 TABLET(10 MG) BY MOUTH AT BEDTIME 30 tablet 3  . VOLTAREN 1 % GEL APPLY 2 GRAMS TOPICALLY FOUR TIMES DAILY 300 g 0   No current facility-administered medications on file prior to visit.     BP (!) 168/82 (BP  Location: Left Arm, Patient Position: Sitting, Cuff Size: Normal)   Pulse 72   Temp 97.9 F (36.6 C) (Oral)   Ht  (1.651 m)   Wt 133 lb 12.8 oz (60.7 kg)   SpO2 98%   BMI 22.27 kg/m     Review of Systems  HENT: Negative for congestion, dental problem, hearing loss, rhinorrhea, sinus pressure, sore throat and tinnitus.   Eyes: Negative for pain, discharge and visual disturbance.  Respiratory: Negative for cough and shortness of breath.   Cardiovascular: Negative for chest pain, palpitations and leg swelling.  Gastrointestinal: Negative for abdominal distention, abdominal pain, blood in stool, constipation, diarrhea, nausea and vomiting.  Genitourinary: Negative for difficulty urinating, dysuria, flank pain, frequency, hematuria, pelvic pain, urgency, vaginal bleeding, vaginal  discharge and vaginal pain.  Musculoskeletal: Positive for neck stiffness. Negative for arthralgias, gait problem and joint swelling.  Skin: Negative for rash.  Neurological: Positive for weakness. Negative for dizziness, syncope, speech difficulty, numbness and headaches.  Hematological: Negative for adenopathy.  Psychiatric/Behavioral: Negative for agitation, behavioral problems and dysphoric mood. The patient is not nervous/anxious.        Objective:   Physical Exam  Constitutional: She is oriented to person, place, and time. She appears well-developed and well-nourished.  Blood pressure 148/80 Able to stand from a sitting position and transferred to the examining table without assistance  HENT:  Head: Normocephalic.  Right Ear: External ear normal.  Left Ear: External ear normal.  Mouth/Throat: Oropharynx is clear and moist.  Eyes: Conjunctivae and EOM are normal. Pupils are equal, round, and reactive to light.  Neck: Normal range of motion. Neck supple. No thyromegaly present.  Cardiovascular: Normal rate, regular rhythm, normal heart sounds and intact distal pulses.   Pulmonary/Chest: Effort normal and breath sounds normal.  Abdominal: Soft. Bowel sounds are normal. She exhibits no mass. There is no tenderness.  Musculoskeletal: Normal range of motion.  Lymphadenopathy:    She has no cervical adenopathy.  Neurological: She is alert and oriented to person, place, and time.  No drift of the outstretched arms Slow but unremarkable.  Gait  Skin: Skin is warm and dry. No rash noted.  Psychiatric: She has a normal mood and affect. Her behavior is normal.          Assessment & Plan:   Status post CVA History of statin intolerance/yopathy Dyslipidemia.  Will give a trial of Zetia Generalized stiffness/weakness.  Will check sedimentation rate, CPK, and general lab  Recheck 3 months Continue rehabilitation exercises  Rogelia Boga

## 2017-02-17 NOTE — Patient Instructions (Signed)
Limit your sodium (Salt) intake    It is important that you exercise regularly, at least 20 minutes 3 to 4 times per week.  If you develop chest pain or shortness of breath seek  medical attention.  Return in 3 months for follow-up  

## 2017-02-17 NOTE — Progress Notes (Signed)
Pre visit review using our clinic review tool, if applicable. No additional management support is needed unless otherwise documented below in the visit note. 

## 2017-02-18 MED ORDER — MIRABEGRON ER 25 MG PO TB24: 25.0000 mg | ORAL_TABLET | Freq: Every day | ORAL | 5 refills | Status: DC

## 2017-02-18 NOTE — Addendum Note (Signed)
Addended by: Neville Route on: 02/18/2017 10:06 AM   Modules accepted: Orders

## 2017-02-26 ENCOUNTER — Other Ambulatory Visit: Payer: Self-pay | Admitting: Internal Medicine

## 2017-03-07 ENCOUNTER — Other Ambulatory Visit: Payer: Self-pay | Admitting: Internal Medicine

## 2017-03-12 ENCOUNTER — Telehealth: Payer: Self-pay | Admitting: Internal Medicine

## 2017-03-12 ENCOUNTER — Emergency Department (HOSPITAL_COMMUNITY)
Admission: EM | Admit: 2017-03-12 | Discharge: 2017-03-12 | Disposition: A | Discharge status: Home / Self Care | Payer: Medicare Other | Attending: Emergency Medicine | Admitting: Emergency Medicine

## 2017-03-12 ENCOUNTER — Encounter (HOSPITAL_COMMUNITY): Payer: Self-pay

## 2017-03-12 ENCOUNTER — Emergency Department (HOSPITAL_COMMUNITY): Payer: Medicare Other

## 2017-03-12 DIAGNOSIS — H538 Other visual disturbances: Secondary | ICD-10-CM | POA: Diagnosis not present

## 2017-03-12 DIAGNOSIS — Z7982 Long term (current) use of aspirin: Secondary | ICD-10-CM | POA: Diagnosis not present

## 2017-03-12 DIAGNOSIS — H539 Unspecified visual disturbance: Secondary | ICD-10-CM

## 2017-03-12 DIAGNOSIS — Z91040 Latex allergy status: Secondary | ICD-10-CM | POA: Insufficient documentation

## 2017-03-12 DIAGNOSIS — R51 Headache: Secondary | ICD-10-CM

## 2017-03-12 DIAGNOSIS — I10 Essential (primary) hypertension: Secondary | ICD-10-CM | POA: Diagnosis not present

## 2017-03-12 DIAGNOSIS — Z8673 Personal history of transient ischemic attack (TIA), and cerebral infarction without residual deficits: Secondary | ICD-10-CM | POA: Diagnosis not present

## 2017-03-12 DIAGNOSIS — R519 Headache, unspecified: Secondary | ICD-10-CM

## 2017-03-12 LAB — CBC
HCT: 43 % (ref 36.0–46.0)
Hemoglobin: 14.2 g/dL (ref 12.0–15.0)
MCH: 27 pg (ref 26.0–34.0)
MCHC: 33 g/dL (ref 30.0–36.0)
MCV: 81.7 fL (ref 78.0–100.0)
Platelets: 261 10*3/uL (ref 150–400)
RBC: 5.26 MIL/uL — ABNORMAL HIGH (ref 3.87–5.11)
RDW: 14.2 % (ref 11.5–15.5)
WBC: 7.6 10*3/uL (ref 4.0–10.5)

## 2017-03-12 LAB — COMPREHENSIVE METABOLIC PANEL
ALT: 15 U/L (ref 14–54)
AST: 23 U/L (ref 15–41)
Albumin: 4.1 g/dL (ref 3.5–5.0)
Alkaline Phosphatase: 51 U/L (ref 38–126)
Anion gap: 9 (ref 5–15)
BUN: 17 mg/dL (ref 6–20)
CO2: 23 mmol/L (ref 22–32)
Calcium: 9.3 mg/dL (ref 8.9–10.3)
Chloride: 108 mmol/L (ref 101–111)
Creatinine, Ser: 0.79 mg/dL (ref 0.44–1.00)
GFR calc Af Amer: >60 mL/min (ref >60)
GFR calc non Af Amer: >60 mL/min (ref >60)
Glucose, Bld: 110 mg/dL — ABNORMAL HIGH (ref 65–99)
Potassium: 4.5 mmol/L (ref 3.5–5.1)
Sodium: 140 mmol/L (ref 135–145)
Total Bilirubin: 0.5 mg/dL (ref 0.3–1.2)
Total Protein: 6.6 g/dL (ref 6.5–8.1)

## 2017-03-12 LAB — C-REACTIVE PROTEIN: CRP: <0.8 mg/dL (ref <1.0)

## 2017-03-12 LAB — SEDIMENTATION RATE: Sed Rate: 3 mm/hr (ref 0–22)

## 2017-03-12 LAB — I-STAT TROPONIN, ED: Troponin i, poc: 0 ng/mL (ref 0.00–0.08)

## 2017-03-12 NOTE — ED Provider Notes (Signed)
[facility] DEPT Provider Note   CSN: [id number] Arrival date & time: 03/12/17  1301     History   Chief Complaint Chief Complaint  Patient presents with  . Headache    HPI Alivea Gladson Mintz-Whitcomb is a 76 y.o. female.  Lexandra Rettke Mintz-Whitcomb is a 76 y.o. Female with a history of a previous CVA with some right sided deficits and chronic right shoulder pain, who presents to the emergency department complaining of a frontal headache since she woke up this morning. Patient reports she woke around 6 AM this morning with a frontal headache that felt better after ibuprofen. She reports currently her pain is "0+/10." She reports little to no pain currently. She reports a "wobbly" feeling when bending over and feels slightly off balance today. She reports 3 days ago her vision felt like there was "water" or a curtain halfway down both of her eyes. She reports this lasted for about 5 mins and resolved and has not returned. No eye pain during this time. She denies current vision complaints. She denies anticoagulation use. She denies head injury, fevers, neck pain, double vision, abdominal pain, nausea, vomiting, chest pain, shortness of breath, numbness, tingling, weakness or rashes. She does not wear contacts or glasses. She denies temporal pain.    The history is provided by the patient, a relative, the spouse and medical records. No language interpreter was used.  Headache   Pertinent negatives include no fever, no shortness of breath, no nausea and no vomiting.    Past Medical History:  Diagnosis Date  . Cervical spine degeneration    C5/C6  . Chronic right shoulder pain   . Hemiparesis affecting dominant side as late effect of cerebrovascular accident ([place]) 12/07/2014  . Hyperlipemia   . Hypertension   . Menopausal syndrome    [name] [place])  . Overactive bladder   . Statin intolerance     Patient Active Problem List   Diagnosis Date Noted  . Statin myopathy 08/29/2016    . Iliotibial band syndrome of right side 04/23/2016  . Acromioclavicular arthrosis 06/30/2015  . Left carpal tunnel syndrome 05/15/2015  . Adhesive capsulitis of right shoulder 02/06/2015  . Dysarthria due to cerebrovascular accident 01/09/2015  . Hemiparesis affecting dominant side as late effect of cerebrovascular accident ([place]) 12/07/2014  . Spastic neurogenic bladder 10/31/2014  . Right rotator cuff tendonitis 10/19/2014  . Stenosis of cervical spine region   . Chronic right shoulder pain   . Cerebral infarction due to thrombosis of left middle cerebral artery ([place])   . Left pontine stroke ([place])   . TIA (transient ischemic attack) 10/14/2014  . Hypokalemia 10/14/2014  . Overactive bladder 10/14/2014  . CVA (cerebral infarction) 10/14/2014  . URI 11/26/2010  . NECK PAIN, CHRONIC 10/29/2010  . Essential hypertension 10/10/2009  . INSOMNIA 09/15/2009  . Pure hypercholesterolemia 11/28/2008  . MENOPAUSAL SYNDROME 11/28/2008    Past Surgical History:  Procedure Laterality Date  . ABDOMINAL HYSTERECTOMY     1985  . BAND HEMORRHOIDECTOMY     2010  . CHOLECYSTECTOMY     1950  . PAROTIDECTOMY     30 years ago    OB History    No data available       Home Medications    Prior to Admission medications   Medication Sig Start Date End Date Taking? Authorizing Provider  aspirin EC 325 MG EC tablet Take 1 tablet (325 mg total) by mouth daily. 11/01/14  Yes Bary Leriche, PA-C  ESTRACE VAGINAL 0.1 MG/GM vaginal cream INSERT 1 GRAM VAGINALLY TWO TIMES A WEEK 08/01/15  Yes Historical Provider, MD  ezetimibe (ZETIA) 10 MG tablet Take 1 tablet (10 mg total) by mouth daily. Patient taking differently: Take 10 mg by mouth every other day.  02/17/17  Yes Marletta Lor, MD  lisinopril (PRINIVIL,ZESTRIL) 20 MG tablet TAKE 1 TABLET(20 MG) BY MOUTH DAILY 03/07/17  Yes Marletta Lor, MD  oxybutynin (DITROPAN-XL) 10 MG 24 hr tablet TAKE 1 TABLET(10 MG) BY MOUTH AT BEDTIME 02/26/17   Yes Marletta Lor, MD  VOLTAREN 1 % GEL APPLY 2 GRAMS TOPICALLY FOUR TIMES DAILY 11/21/16  Yes Charlett Blake, MD  mirabegron ER (MYRBETRIQ) 25 MG TB24 tablet Take 1 tablet (25 mg total) by mouth daily. Patient not taking: Reported on 03/12/2017 02/18/17   Marletta Lor, MD    Family History Family History  Problem Relation Age of Onset  . Heart failure Father   . Diabetes Mother   . Hypertension Mother   . Stroke Mother   . Cirrhosis Sister   . Hyperlipidemia Brother   . Hypertension Brother   . Stroke Brother   . Diabetes Brother     Social History Social History  Substance Use Topics  . Smoking status: Never Smoker  . Smokeless tobacco: Never Used  . Alcohol use No     Allergies   Statins and Latex   Review of Systems Review of Systems  Constitutional: Negative for chills and fever.  HENT: Negative for congestion and sore throat.   Eyes: Positive for visual disturbance (resolved ). Negative for photophobia, pain and redness.  Respiratory: Negative for cough and shortness of breath.   Cardiovascular: Negative for chest pain.  Gastrointestinal: Negative for abdominal pain, diarrhea, nausea and vomiting.  Genitourinary: Negative for dysuria.  Musculoskeletal: Negative for back pain and neck pain.  Skin: Negative for rash.  Neurological: Positive for headaches. Negative for dizziness, seizures, syncope, facial asymmetry, weakness, light-headedness and numbness.     Physical Exam Updated Vital Signs BP 128/76   Pulse 69   Temp 98.5 F (36.9 C) (Oral)   Resp 17   Ht '5\' 6"'  (1.676 m)   Wt 59.9 kg   SpO2 99%   BMI 21.31 kg/m   Physical Exam  Constitutional: She is oriented to person, place, and time. She appears well-developed and well-nourished. No distress.  Nontoxic appearing.   HENT:  Head: Normocephalic and atraumatic.  Right Ear: External ear normal.  Left Ear: External ear normal.  Mouth/Throat: Oropharynx is clear and moist.  No  temporal edema or tenderness.   Eyes: Conjunctivae and EOM are normal. Pupils are equal, round, and reactive to light. Right eye exhibits no discharge. Left eye exhibits no discharge.  Neck: Normal range of motion. Neck supple. No JVD present. No tracheal deviation present.  Cardiovascular: Normal rate, regular rhythm, normal heart sounds and intact distal pulses.  Exam reveals no gallop and no friction rub.   No murmur heard. Pulmonary/Chest: Effort normal and breath sounds normal. No stridor. No respiratory distress. She has no wheezes. She has no rales.  Lungs are clear to ascultation bilaterally. Symmetric chest expansion bilaterally. No increased work of breathing. No rales or rhonchi.    Abdominal: Soft. There is no tenderness.  Musculoskeletal: Normal range of motion. She exhibits no edema or tenderness.  Lymphadenopathy:    She has no cervical adenopathy.  Neurological: She is alert and oriented to person, place, and time.  No cranial nerve deficit or sensory deficit. She exhibits normal muscle tone. Coordination normal.  Patient is alert and oriented 3. Cranial nerves are intact. Speech is clear and coherent. Vision is grossly intact. EOMs are intact. No pronator drift. Finger-to-nose intact bilaterally. Heel-to-shin intact bilaterally. Good strength to bilateral upper and lower extremities. Good strength with plantar and dorsiflexion bilaterally. Slight right foot drop with ambulation which patient and family report is chronic.   Skin: Skin is warm and dry. Capillary refill takes less than 2 seconds. No rash noted. She is not diaphoretic. No erythema. No pallor.  Psychiatric: She has a normal mood and affect. Her behavior is normal.  Nursing note and vitals reviewed.    Visual Acuity  Right Eye Distance:   Left Eye Distance:   Bilateral Distance:    Right Eye Near: R Near: 20/30 Left Eye Near:  L Near: 20/40 Bilateral Near:  20/50   ED Treatments / Results  Labs (all labs  ordered are listed, but only abnormal results are displayed) Labs Reviewed  CBC - Abnormal; Notable for the following:       Result Value   RBC 5.26 (*)    All other components within normal limits  COMPREHENSIVE METABOLIC PANEL - Abnormal; Notable for the following:    Glucose, Bld 110 (*)    All other components within normal limits  SEDIMENTATION RATE  C-REACTIVE PROTEIN  I-STAT TROPOININ, ED    EKG  EKG Interpretation None       Radiology Ct Head Wo Contrast  Result Date: 03/12/2017 CLINICAL DATA:  Intermittent headache, blurred vision for 2 days. EXAM: CT HEAD WITHOUT CONTRAST TECHNIQUE: Contiguous axial images were obtained from the base of the skull through the vertex without intravenous contrast. COMPARISON:  10/15/2014 FINDINGS: Brain: No acute intracranial abnormality. Specifically, no hemorrhage, hydrocephalus, mass lesion, acute infarction, or significant intracranial injury. Vascular: No hyperdense vessel or unexpected calcification. Skull: No acute calvarial abnormality. Sinuses/Orbits: Visualized paranasal sinuses and mastoids clear. Orbital soft tissues unremarkable. Other: None IMPRESSION: No acute intracranial abnormality. Electronically Signed   By: Rolm Baptise M.D.   On: 03/12/2017 15:27    Procedures Procedures (including critical care time)  Medications Ordered in ED Medications - No data to display   Initial Impression / Assessment and Plan / ED Course  I have reviewed the triage vital signs and the nursing notes.  Pertinent labs & imaging results that were available during my care of the patient were reviewed by me and considered in my medical decision making (see chart for details).    This is a 76 y.o. Female with a history of a previous CVA with some right sided deficits and chronic right shoulder pain, who presents to the emergency department complaining of a frontal headache since she woke up this morning. Patient reports she woke around 6 AM this  morning with a frontal headache that felt better after ibuprofen. She reports currently her pain is "0+/10." She reports little to no pain currently. She reports a "wobbly" feeling when bending over and feels slightly off balance today. She reports 3 days ago her vision felt like there was "water" or a curtain halfway down both of her eyes. She reports this lasted for about 5 mins and resolved and has not returned. No eye pain during this time. She denies current vision complaints. She denies anticoagulation use. On exam patient is afebrile nontoxic appearing. She has no focal neurological deficits on exam. She does have very slight right  foot drop with stimulation. Patient and family report this is chronic. No changes to her vision. EOMs are intact. No temporal edema or tenderness on exam. CT head without contrast is unremarkable. At reevaluation patient reports she is feeling normal. She denies any headache currently. She denies any changes to her vision. She believes without difficulty. She denies feeling lightheaded or dizzy with ambulation. Plan is for ESR and CRP to rule out any temporal arteritis. If these are not elevated, she can be discharged with follow up by PCP. Patient and family agree with plan.  At shift change or waiting ESR and CRP. Patient care signed out to Ferdinand Cava at shift change.   This patient was discussed with and evaluated by Dr. Rex Kras who agrees with assessment and plan.   Final Clinical Impressions(s) / ED Diagnoses   Final diagnoses:  Bad headache  Visual changes    New Prescriptions New Prescriptions   No medications on file     Waynetta Pean, PA-C 03/12/17 [place], MD 03/20/17 1505

## 2017-03-12 NOTE — ED Provider Notes (Signed)
Assumed care from [place] @ 1630; see that note for full H&P. In stable condition at the time of transfer. Currently awaiting ESR & CRP to rule out temporal arteritis. Plan is to d/c home if normal.  UPDATE: ESR & CRP WNL.  Explained all results to the Pt. Will discharge the Pt home. Recommending follow-up with PCP. ED return precautions provided. Pt acknowledged understanding of, and concurrence with the plan. All questions answered to her satisfaction. In stable condition at the time of discharge.   Jenny Reichmann, MD 03/12/17 1720    Forde Dandy, MD 03/13/17 [phone]

## 2017-03-12 NOTE — Telephone Encounter (Signed)
Patient Name: Teresa Martinez DOB: 1941-05-30 Initial Comment Caller states she has a headache. Nurse Assessment Nurse: Leveda Anna, RN, Aer[place] Date/Time (Eastern Time): 03/12/2017 11:25:57 AM Confirm and document reason for call. If symptomatic, describe symptoms. ---Caller states, she has had a headache this morning. Her head has been feeling funny since Monday. On Monday her eyes became cloudy she stood up and shook it off. Today she has the worse headache she has ever had in her life. She has a hx of a stroke 2 years ago. She has a problem with her shoulder her arm is giving her pain on the same side as the stroke. This has been going on for over a month. Does the patient have any new or worsening symptoms? ---Yes Will a triage be completed? ---Yes Related visit to physician within the last 2 weeks? ---No Does the PT have any chronic conditions? (i.e. diabetes, asthma, etc.) ---Yes List chronic conditions. ---high cholesterol, htn, Is this a behavioral health or substance abuse call? ---No Guidelines Guideline Title Affirmed Question Affirmed Notes Headache Patient sounds very sick or weak to the triager Final Disposition User Go to ED Now (or PCP triage) Leveda Anna, RN, Aeriel Comments She took some advil and her head is not hurting now Nurse choice related pt having an issue on Monday where she lost her vision and then woke up with the worst headache of her life this morning. Pt has also had a nagging pain in her head since Monday. Referrals [facility] - ED [facility] - ED Disagree/Comply: Comply

## 2017-03-12 NOTE — ED Triage Notes (Signed)
PT reports Monday morning she briefly had visual disturbances and felt like "water was half way over my eye". PT states her vision was blurry and felt like it was only the top part of her eye. She states it was clear on the bottom part. This morning she woke up with severe headache and blurred vision around 0600. She took motrin and headache is resolved at this time. PT is concerned d/t hx of stroke. She states she has been more "wobbly" than normal. Denies dizziness, confusion, or weakness. A&Ox4. NAD

## 2017-03-12 NOTE — Telephone Encounter (Signed)
error 

## 2017-03-12 NOTE — ED Notes (Signed)
Patient transported to CT 

## 2017-03-12 NOTE — Telephone Encounter (Signed)
Spoke to patient's spouse who reports patient is in route to ER with daughter; spouse is meeting them at ER.

## 2017-03-14 ENCOUNTER — Encounter: Payer: Self-pay | Admitting: Internal Medicine

## 2017-03-14 ENCOUNTER — Ambulatory Visit (INDEPENDENT_AMBULATORY_CARE_PROVIDER_SITE_OTHER): Payer: Medicare Other | Admitting: Internal Medicine

## 2017-03-14 VITALS — BP 138/78 | HR 72 | Temp 98.8°F | Ht 66.0 in | Wt 136.2 lb

## 2017-03-14 DIAGNOSIS — G8929 Other chronic pain: Secondary | ICD-10-CM

## 2017-03-14 DIAGNOSIS — N319 Neuromuscular dysfunction of bladder, unspecified: Secondary | ICD-10-CM | POA: Diagnosis not present

## 2017-03-14 DIAGNOSIS — I69359 Hemiplegia and hemiparesis following cerebral infarction affecting unspecified side: Secondary | ICD-10-CM

## 2017-03-14 DIAGNOSIS — H539 Unspecified visual disturbance: Secondary | ICD-10-CM

## 2017-03-14 DIAGNOSIS — N318 Other neuromuscular dysfunction of bladder: Secondary | ICD-10-CM

## 2017-03-14 DIAGNOSIS — M25511 Pain in right shoulder: Secondary | ICD-10-CM

## 2017-03-14 MED ORDER — METHYLPREDNISOLONE ACETATE 80 MG/ML IJ SUSP
40.0000 mg | Freq: Once | INTRAMUSCULAR | Status: AC
  Administered 2017-03-14: 40 mg via INTRAMUSCULAR

## 2017-03-14 MED ORDER — METHYLPREDNISOLONE ACETATE 40 MG/ML IJ SUSP
40.0000 mg | Freq: Once | INTRAMUSCULAR | Status: DC
Start: 2017-03-14 — End: 2017-03-14

## 2017-03-14 NOTE — Progress Notes (Signed)
Subjective:    Patient ID: Teresa Martinez, female    DOB: 02-Aug-1941, 76 y.o.   MRN: [id number]  HPI  76 year old patient who has a history of a prior left brain stroke with right-sided weakness. She was 76 seen in the ED 2 days ago after awakening with severe headaches.  The headaches largely resolved by the time she was evaluated in the ED.  ED evaluation included a head CT as well as screening lab   That included ESR and C-reactive protein.Wilburn Mylar early morning she had another severe headache in the frontal area, but has done well since Complaints include right shoulder pain.  This has been a chronic problem Her main complaint today of visual disturbances.  She describes a sense of a "3-D affect" were items, such as skin, hair and fabrics such as carpets become much more prominent and 3-D like.  She had a brief episode of partial visual loss that affected the upper visual fields of both eyes that occurred prior to her ED visit 2 days ago.  She described this as a "curtain of water". Complaints today include the visual disturbances, right shoulder discomfort and increasing gait disturbance with a sense of unsteadiness.  Past Medical History:  Diagnosis Date  . Cervical spine degeneration    C5/C6  . Chronic right shoulder pain   . Hemiparesis affecting dominant side as late effect of cerebrovascular accident ([place]) 12/07/2014  . Hyperlipemia   . Hypertension   . Menopausal syndrome    [name] [place])  . Overactive bladder   . Statin intolerance      Social History   Social History  . Marital status: Married    Spouse name: N/A  . Number of children: 4  . Years of education: N/A   Occupational History  . retired    Social History Main Topics  . Smoking status: Never Smoker  . Smokeless tobacco: Never Used  . Alcohol use No  . Drug use: No  . Sexual activity: Not on file   Other Topics Concern  . Not on file   Social History Narrative   Regular exercise,  4 children, 6 grandchildren, one stepchild and two additional stepgrandchildren   Patient is right handed.   Patient doesn't drink caffeine.    Past Surgical History:  Procedure Laterality Date  . ABDOMINAL HYSTERECTOMY     1985  . BAND HEMORRHOIDECTOMY     2010  . CHOLECYSTECTOMY     1950  . PAROTIDECTOMY     30 years ago    Family History  Problem Relation Age of Onset  . Heart failure Father   . Diabetes Mother   . Hypertension Mother   . Stroke Mother   . Cirrhosis Sister   . Hyperlipidemia Brother   . Hypertension Brother   . Stroke Brother   . Diabetes Brother     Allergies  Allergen Reactions  . Statins Other (See Comments)    Muscle pain and unable to walk  . Latex Other (See Comments)    Burns her skin    Current Outpatient Prescriptions on File Prior to Visit  Medication Sig Dispense Refill  . aspirin EC 325 MG EC tablet Take 1 tablet (325 mg total) by mouth daily. 100 tablet 1  . ESTRACE VAGINAL 0.1 MG/GM vaginal cream INSERT 1 GRAM VAGINALLY TWO TIMES A WEEK  3  . lisinopril (PRINIVIL,ZESTRIL) 20 MG tablet TAKE 1 TABLET(20 MG) BY MOUTH DAILY 90 tablet 0  . VOLTAREN  1 % GEL APPLY 2 GRAMS TOPICALLY FOUR TIMES DAILY 300 g 0  . ezetimibe (ZETIA) 10 MG tablet Take 1 tablet (10 mg total) by mouth daily. (Patient not taking: Reported on 03/14/2017) 90 tablet 2  . mirabegron ER (MYRBETRIQ) 25 MG TB24 tablet Take 1 tablet (25 mg total) by mouth daily. (Patient not taking: Reported on 03/12/2017) 30 tablet 5   No current facility-administered medications on file prior to visit.     BP 138/78 (BP Location: Left Arm, Patient Position: Sitting, Cuff Size: Normal)   Pulse 72   Temp 98.8 F (37.1 C)   Ht _0  (1.676 m)   Wt 136 lb 3.2 oz (61.8 kg)   SpO2 96%   BMI 21.98 kg/m    Review of Systems  Constitutional: Negative.   HENT: Negative for congestion, dental problem, hearing loss, rhinorrhea, sinus pressure, sore throat and tinnitus.   Eyes: Positive for  visual disturbance. Negative for pain and discharge.  Respiratory: Negative for cough and shortness of breath.   Cardiovascular: Negative for chest pain, palpitations and leg swelling.  Gastrointestinal: Negative for abdominal distention, abdominal pain, blood in stool, constipation, diarrhea, nausea and vomiting.  Genitourinary: Negative for difficulty urinating, dysuria, flank pain, frequency, hematuria, pelvic pain, urgency, vaginal bleeding, vaginal discharge and vaginal pain.  Musculoskeletal: Positive for arthralgias, gait problem and neck pain. Negative for joint swelling.  Skin: Negative for rash.  Neurological: Positive for headaches. Negative for dizziness, syncope, speech difficulty, weakness and numbness.  Hematological: Negative for adenopathy.  Psychiatric/Behavioral: Negative for agitation, behavioral problems and dysphoric mood. The patient is not nervous/anxious.        Objective:   Physical Exam  Constitutional: She is oriented to person, place, and time. She appears well-developed and well-nourished.  HENT:  Head: Normocephalic.  Right Ear: External ear normal.  Left Ear: External ear normal.  Mouth/Throat: Oropharynx is clear and moist.  Eyes: Conjunctivae and EOM are normal. Pupils are equal, round, and reactive to light.  Neck: Normal range of motion. Neck supple. No thyromegaly present.  Cardiovascular: Normal rate, regular rhythm, normal heart sounds and intact distal pulses.   Pulmonary/Chest: Effort normal and breath sounds normal.  Abdominal: Soft. Bowel sounds are normal. She exhibits no mass. There is no tenderness.  Musculoskeletal: Normal range of motion.  Range of motion of the right shoulder does elicit discomfort  Lymphadenopathy:    She has no cervical adenopathy.  Neurological: She is alert and oriented to person, place, and time.  Alert, appropriate with normal speech Normal pupil responses No facial asymmetry No drift Normal finger to nose  testing Slightly impaired heel-to-shin testing on the right Slight weakness of right ankle dorsiflexion  Skin: Skin is warm and dry. No rash noted.  Psychiatric: She has a normal mood and affect. Her behavior is normal.          Assessment & Plan:   Status post left brain stroke.  Patient continues to have some functional limitations with gait instability and symptoms of neurogenic bladder.  The patient was given a trial of myrbetriq , which was not helpful and this medication has been discontinued today. Visual disturbances.  Unclear etiology.  Patient will follow-up with ophthalmology as initial step.  Seem more consistent with a visual hallucination Onset of headaches.  She has had 2 episodes over the past 3 days.  Will continue to observe.  Status post negative head CT.  Negative inflammatory markers Chronic right shoulder pain  The patient  wishes to resume physical therapy Ophthalmology evaluation Patient report any new or worsening symptoms Return here in 4 weeks for follow-up  Teresa Martinez

## 2017-03-14 NOTE — Patient Instructions (Addendum)
Limit your sodium (Salt) intake  Please see your eye doctor for evaluation  Call or return to clinic prn if these symptoms worsen or fail to improve as anticipated.   Return in one month for follow-up  Continue to hold the Zetia and myrbetriq

## 2017-03-18 ENCOUNTER — Other Ambulatory Visit: Payer: Self-pay | Admitting: Internal Medicine

## 2017-03-18 DIAGNOSIS — R58 Hemorrhage, not elsewhere classified: Secondary | ICD-10-CM

## 2017-04-04 ENCOUNTER — Ambulatory Visit (INDEPENDENT_AMBULATORY_CARE_PROVIDER_SITE_OTHER): Payer: Medicare Other | Admitting: Internal Medicine

## 2017-04-04 ENCOUNTER — Telehealth: Payer: Self-pay | Admitting: Internal Medicine

## 2017-04-04 ENCOUNTER — Encounter: Payer: Self-pay | Admitting: Internal Medicine

## 2017-04-04 VITALS — BP 148/66 | HR 69 | Temp 98.3°F | Ht 66.0 in | Wt 136.6 lb

## 2017-04-04 DIAGNOSIS — I10 Essential (primary) hypertension: Secondary | ICD-10-CM

## 2017-04-04 DIAGNOSIS — G8929 Other chronic pain: Secondary | ICD-10-CM

## 2017-04-04 DIAGNOSIS — M25511 Pain in right shoulder: Secondary | ICD-10-CM | POA: Diagnosis not present

## 2017-04-04 DIAGNOSIS — I6350 Cerebral infarction due to unspecified occlusion or stenosis of unspecified cerebral artery: Secondary | ICD-10-CM | POA: Diagnosis not present

## 2017-04-04 DIAGNOSIS — I639 Cerebral infarction, unspecified: Secondary | ICD-10-CM

## 2017-04-04 NOTE — Telephone Encounter (Signed)
Please see message below, please advise 

## 2017-04-04 NOTE — Telephone Encounter (Signed)
Okay for order?

## 2017-04-04 NOTE — Telephone Encounter (Signed)
Teresa HashimotoPatricia [facility] would like to have a order that is in the system for the pt to be changed to MRI for brain w/and w/out contrast so that they can call pt to have it done today.  Pt also need medication for claustrophobia.

## 2017-04-04 NOTE — Progress Notes (Signed)
Subjective:    Patient ID: Teresa SchaumannFrances H Martinez, female    DOB: 03/31/1941, 76 y.o.   MRN: [id number]  HPI  76 year old patient who is seen today for follow-up.  She sustained a laceration to the right anterior lower leg 8 days ago.  This has responded nicely to local wound therapy.  No redness or drainage She does have a history of essential hypertension and cerebrovascular disease.  She was seen 2 weeks ago with headaches and visual changes.  2 days prior, she was evaluated in the ED, which included a head CT scan.  Apparently the patient is scheduled for a brain MRI.  She feels that she is having some minimal balance difficulty, but headaches and visual disturbances have normalized.  She has been seen by ophthalmology.  Past Medical History:  Diagnosis Date  . Cervical spine degeneration    C5/C6  . Chronic right shoulder pain   . Hemiparesis affecting dominant side as late effect of cerebrovascular accident ([facility]) 12/07/2014  . Hyperlipemia   . Hypertension   . Menopausal syndrome    [name])  . Overactive bladder   . Statin intolerance      Social History   Social History  . Marital status: Married    Spouse name: N/A  . Number of children: 4  . Years of education: N/A   Occupational History  . retired    Social History Main Topics  . Smoking status: Never Smoker  . Smokeless tobacco: Never Used  . Alcohol use No  . Drug use: No  . Sexual activity: Not on file   Other Topics Concern  . Not on file   Social History Narrative   Regular exercise, 4 children, 6 grandchildren, one stepchild and two additional stepgrandchildren   Patient is right handed.   Patient doesn't drink caffeine.    Past Surgical History:  Procedure Laterality Date  . ABDOMINAL HYSTERECTOMY     1985  . BAND HEMORRHOIDECTOMY     2010  . CHOLECYSTECTOMY     1950  . PAROTIDECTOMY     30 years ago    Family History  Problem Relation Age of Onset  . Heart failure Father     . Diabetes Mother   . Hypertension Mother   . Stroke Mother   . Cirrhosis Sister   . Hyperlipidemia Brother   . Hypertension Brother   . Stroke Brother   . Diabetes Brother     Allergies  Allergen Reactions  . Statins Other (See Comments)    Muscle pain and unable to walk  . Latex Other (See Comments)    Burns her skin    Current Outpatient Prescriptions on File Prior to Visit  Medication Sig Dispense Refill  . aspirin EC 325 MG EC tablet Take 1 tablet (325 mg total) by mouth daily. 100 tablet 1  . ESTRACE VAGINAL 0.1 MG/GM vaginal cream INSERT 1 GRAM VAGINALLY TWO TIMES A WEEK  3  . lisinopril (PRINIVIL,ZESTRIL) 20 MG tablet TAKE 1 TABLET(20 MG) BY MOUTH DAILY 90 tablet 0  . VOLTAREN 1 % GEL APPLY 2 GRAMS TOPICALLY FOUR TIMES DAILY 300 g 0  . ezetimibe (ZETIA) 10 MG tablet Take 1 tablet (10 mg total) by mouth daily. (Patient not taking: Reported on 03/14/2017) 90 tablet 2  . mirabegron ER (MYRBETRIQ) 25 MG TB24 tablet Take 1 tablet (25 mg total) by mouth daily. (Patient not taking: Reported on 03/12/2017) 30 tablet 5   No current facility-administered medications  on file prior to visit.     BP (!) 148/66 (BP Location: Right Arm, Patient Position: Sitting, Cuff Size: Normal)   Pulse 69   Temp 98.3 F (36.8 C) (Oral)   Ht 5\' 6"  (1.676 m)   Wt 136 lb 9.6 oz (62 kg)   SpO2 98%   BMI 22.05 kg/m     Review of Systems  Constitutional: Negative.   HENT: Negative for congestion, dental problem, hearing loss, rhinorrhea, sinus pressure, sore throat and tinnitus.   Eyes: Negative for pain, discharge and visual disturbance.  Respiratory: Negative for cough and shortness of breath.   Cardiovascular: Negative for chest pain, palpitations and leg swelling.  Gastrointestinal: Negative for abdominal distention, abdominal pain, blood in stool, constipation, diarrhea, nausea and vomiting.  Genitourinary: Negative for difficulty urinating, dysuria, flank pain, frequency, hematuria,  pelvic pain, urgency, vaginal bleeding, vaginal discharge and vaginal pain.  Musculoskeletal: Negative for arthralgias, gait problem and joint swelling.       Chronic right shoulder pain  Skin: Negative for rash.  Neurological: Negative for dizziness, syncope, speech difficulty, weakness, numbness and headaches.  Hematological: Negative for adenopathy.  Psychiatric/Behavioral: Negative for agitation, behavioral problems and dysphoric mood. The patient is not nervous/anxious.        Objective:   Physical Exam  Constitutional: She is oriented to person, place, and time. She appears well-nourished. No distress.  Neurological: She is alert and oriented to person, place, and time.  Normal finger to nose testing Gait slightly unsteady but not ataxic  Skin:  Clean superficial healing vertical laceration of approximately 3 cm involving the right anterior lower leg          Assessment & Plan:   Laceration right anterior lower leg Essential hypertension, stable Cerebrovascular disease.  This appears to be clinically stable  Follow-up here 3 months  Rogelia Boga

## 2017-04-04 NOTE — Patient Instructions (Signed)
Limit your sodium (Salt) intake  Please check your blood pressure on a regular basis.  If it is consistently greater than 150/90, please make an office appointment.  Return in 3 months for follow-up  

## 2017-04-04 NOTE — Telephone Encounter (Signed)
Per Dr Kirtland BouchardK I [facility]. Pt was seen today and is no longer having vision issues

## 2017-04-30 ENCOUNTER — Other Ambulatory Visit: Payer: Self-pay | Admitting: Internal Medicine

## 2017-05-14 ENCOUNTER — Ambulatory Visit (INDEPENDENT_AMBULATORY_CARE_PROVIDER_SITE_OTHER): Payer: Medicare Other | Admitting: Internal Medicine

## 2017-05-14 ENCOUNTER — Encounter: Payer: Self-pay | Admitting: Internal Medicine

## 2017-05-14 VITALS — BP 152/70 | HR 77 | Temp 98.5°F | Ht 66.0 in | Wt 139.4 lb

## 2017-05-14 DIAGNOSIS — K115 Sialolithiasis: Secondary | ICD-10-CM

## 2017-05-14 DIAGNOSIS — I10 Essential (primary) hypertension: Secondary | ICD-10-CM

## 2017-05-14 NOTE — Patient Instructions (Addendum)
WE NOW OFFER   [facility] FAST TRACK!!!  SAME DAY Appointments for ACUTE CARE  Such as: Sprains, Injuries, cuts, abrasions, rashes, muscle pain, joint pain, back pain Colds, flu, sore throats, headache, allergies, cough, fever  Ear pain, sinus and eye infections Abdominal pain, nausea, vomiting, diarrhea, upset stomach Animal/insect bites  3 Easy Ways to Schedule: Walk-In Scheduling Call in scheduling Mychart Sign-up: [email or web address]        Salivary Stone A salivary stone is a mineral deposit that builds up in the ducts that drain your salivary glands. Most salivary gland stones are made of calcium. When a stone forms, saliva can back up into the gland and cause painful swelling. Your salivary glands are the glands that produce spit (saliva). You have six major salivary glands. Each gland has a duct that carries saliva into your mouth. Saliva keeps your mouth moist and breaks down the food that you eat. It also helps to prevent tooth decay. Two salivary glands are located just in front of your ears (parotid). The ducts for these glands open up inside your cheeks, near your back teeth. You also have two glands under your tongue (sublingual) and two glands under your jaw (submandibular). The ducts for these glands open under your tongue. A stone can form in any salivary gland. The most common place for a salivary stone to develop is in a submandibular salivary gland. What are the causes? Any condition that reduces the flow of saliva may lead to stone formation. It is not known why some people form stones and others do not. What increases the risk? You may be more likely to develop a salivary stone if you:  Are female.  Do not drink enough water.  Smoke.  Have high blood pressure.  Have gout.  Have diabetes.  What are the signs or symptoms? The main sign of a salivary gland stone is sudden swelling of a salivary gland when eating. This usually  happens under the jaw on one side. Other signs and symptoms include:  Swelling of the cheek or under the tongue when eating.  Pain in the swollen area.  Trouble chewing or swallowing.  Swelling that goes down after eating.  How is this diagnosed? Your health care provider may diagnose a salivary gland stone based on your signs and symptoms. The health care provider will also do a physical exam. In many cases, a stone can be felt in a duct inside your mouth. You may need to see an ear, nose, and throat specialist (ENT or otolaryngologist) for diagnosis and treatment. You may also need to have diagnostic tests. These may include imaging studies to check for a stone, such as:  X-rays.  Ultrasound.  CT scan.  MRI.  How is this treated? Home care may be enough to treat a small stone that is not causing symptoms. Treatment of a stone that is large enough to cause symptoms may include:  Probing and widening the duct to allow the stone to pass.  Inserting a thin, flexible scope (endoscope) into the duct to locate and remove the stone.  Breaking up the stone with sound waves.  Removing the entire salivary gland.  Follow these instructions at home:  Drink enough fluid to keep your urine clear or pale yellow.  Follow these instructions every few hours: ? Suck on a lemon candy to stimulate the flow of saliva. ? Put a hot compress over the gland. ? Gently massage the gland.  Do not use any tobacco products,  including cigarettes, chewing tobacco, or electronic cigarettes. If you need help quitting, ask your health care provider. Contact a health care provider if:  You have pain and swelling in your face, jaw, or mouth after eating.  You have persistent swelling in any of these places: ? In front of your ear. ? Under your jaw. ? Inside your mouth. Get help right away if:  You have pain and swelling in your face, jaw, or mouth that are getting worse.  Your pain and swelling make  it hard to swallow or breathe. This information is not intended to replace advice given to you by your health care provider. Make sure you discuss any questions you have with your health care provider. Document Released: 12/12/2004 Document Revised: 04/11/2016 Document Reviewed: 04/06/2014 Elsevier Interactive Patient Education  2018 [employer].

## 2017-05-14 NOTE — Progress Notes (Signed)
Subjective:    Patient ID: Teresa Martinez, female    DOB: [birth year], 76 y.o.   MRN: [id number]  HPI  76 year old patient who has remote history of a right parotidectomy.  He was stable until yesterday when she noted the onset of swelling and some discomfort in the right submandibular area.  She has been using warm compresses and gentle massage and the area has improved.  Past Medical History:  Diagnosis Date  . Cervical spine degeneration    C5/C6  . Chronic right shoulder pain   . Hemiparesis affecting dominant side as late effect of cerebrovascular accident ([facility]) 12/07/2014  . Hyperlipemia   . Hypertension   . Menopausal syndrome    Teresa Martinez)  . Overactive bladder   . Statin intolerance      Social History   Social History  . Marital status: Married    Spouse name: N/A  . Number of children: 4  . Years of education: N/A   Occupational History  . retired    Social History Main Topics  . Smoking status: Never Smoker  . Smokeless tobacco: Never Used  . Alcohol use No  . Drug use: No  . Sexual activity: Not on file   Other Topics Concern  . Not on file   Social History Narrative   Regular exercise, 4 children, 6 grandchildren, one stepchild and two additional stepgrandchildren   Patient is right handed.   Patient doesn't drink caffeine.    Past Surgical History:  Procedure Laterality Date  . ABDOMINAL HYSTERECTOMY     1985  . BAND HEMORRHOIDECTOMY     2010  . CHOLECYSTECTOMY     1950  . PAROTIDECTOMY     30 years ago    Family History  Problem Relation Age of Onset  . Heart failure Father   . Diabetes Mother   . Hypertension Mother   . Stroke Mother   . Cirrhosis Sister   . Hyperlipidemia Brother   . Hypertension Brother   . Stroke Brother   . Diabetes Brother     Allergies  Allergen Reactions  . Statins Other (See Comments)    Muscle pain and unable to walk  . Latex Other (See Comments)    Burns her skin    Current  Outpatient Prescriptions on File Prior to Visit  Medication Sig Dispense Refill  . aspirin EC 325 MG EC tablet Take 1 tablet (325 mg total) by mouth daily. 100 tablet 1  . ESTRACE VAGINAL 0.1 MG/GM vaginal cream INSERT 1 GRAM VAGINALLY TWO TIMES A WEEK  3  . ezetimibe (ZETIA) 10 MG tablet Take 1 tablet (10 mg total) by mouth daily. 90 tablet 2  . lisinopril (PRINIVIL,ZESTRIL) 20 MG tablet TAKE 1 TABLET(20 MG) BY MOUTH DAILY 90 tablet 0  . oxybutynin (DITROPAN-XL) 10 MG 24 hr tablet TAKE 1 TABLET(10 MG) BY MOUTH AT BEDTIME 30 tablet 0  . VOLTAREN 1 % GEL APPLY 2 GRAMS TOPICALLY FOUR TIMES DAILY 300 g 0   No current facility-administered medications on file prior to visit.     BP (!) 152/70 (BP Location: Left Arm, Patient Position: Sitting, Cuff Size: Normal)   Pulse 77   Temp 98.5 F (36.9 C) (Oral)   Ht 5\' 6"  (1.676 m)   Wt 139 lb 6.4 oz (63.2 kg)   SpO2 98%   BMI 22.50 kg/m    Review of Systems  Constitutional: Negative.   HENT: Negative for congestion, dental problem, hearing loss,  rhinorrhea, sinus pressure, sore throat and tinnitus.        Right submandibular painful mass  Eyes: Negative for pain, discharge and visual disturbance.  Respiratory: Negative for cough and shortness of breath.   Cardiovascular: Negative for chest pain, palpitations and leg swelling.  Gastrointestinal: Negative for abdominal distention, abdominal pain, blood in stool, constipation, diarrhea, nausea and vomiting.  Genitourinary: Negative for difficulty urinating, dysuria, flank pain, frequency, hematuria, pelvic pain, urgency, vaginal bleeding, vaginal discharge and vaginal pain.  Musculoskeletal: Negative for arthralgias, gait problem and joint swelling.  Skin: Negative for rash.  Neurological: Negative for dizziness, syncope, speech difficulty, weakness, numbness and headaches.  Hematological: Negative for adenopathy.  Psychiatric/Behavioral: Negative for agitation, behavioral problems and dysphoric  mood. The patient is not nervous/anxious.        Objective:   Physical Exam  Constitutional: She appears well-developed and well-nourished. No distress.  Blood pressure 145/70  HENT:  Mouth/Throat: Oropharynx is clear and moist.  Neck: No thyromegaly present.  3 cm slightly tender, firm right submandibular mass  Lymphadenopathy:    She has no cervical adenopathy.          Assessment & Plan:   Right submandibular salivary gland stone with sialoadenitis. We'll continue warm compresses and gentle massage Will try tart candy  ENT referral if unimproved  Rogelia BogaKWIATKOWSKI,Disha Cottam FRANK

## 2017-05-30 ENCOUNTER — Ambulatory Visit: Payer: Medicare Other | Admitting: Internal Medicine

## 2017-06-04 ENCOUNTER — Other Ambulatory Visit: Payer: Self-pay | Admitting: Internal Medicine

## 2017-06-10 ENCOUNTER — Other Ambulatory Visit: Payer: Self-pay | Admitting: Internal Medicine

## 2017-07-14 ENCOUNTER — Encounter: Payer: Self-pay | Admitting: Internal Medicine

## 2017-07-14 ENCOUNTER — Ambulatory Visit (INDEPENDENT_AMBULATORY_CARE_PROVIDER_SITE_OTHER): Payer: Medicare Other | Admitting: Internal Medicine

## 2017-07-14 VITALS — BP 140/82 | HR 80 | Temp 97.9°F | Ht 66.0 in | Wt 138.8 lb

## 2017-07-14 DIAGNOSIS — I679 Cerebrovascular disease, unspecified: Secondary | ICD-10-CM | POA: Diagnosis not present

## 2017-07-14 DIAGNOSIS — E7800 Pure hypercholesterolemia, unspecified: Secondary | ICD-10-CM

## 2017-07-14 DIAGNOSIS — I10 Essential (primary) hypertension: Secondary | ICD-10-CM | POA: Diagnosis not present

## 2017-07-14 DIAGNOSIS — I6350 Cerebral infarction due to unspecified occlusion or stenosis of unspecified cerebral artery: Secondary | ICD-10-CM

## 2017-07-14 DIAGNOSIS — I639 Cerebral infarction, unspecified: Secondary | ICD-10-CM

## 2017-07-14 DIAGNOSIS — Z0000 Encounter for general adult medical examination without abnormal findings: Secondary | ICD-10-CM

## 2017-07-14 LAB — LIPID PANEL
Cholesterol: 262 mg/dL — ABNORMAL HIGH (ref 0–200)
HDL: 47.5 mg/dL (ref >39.00)
LDL Cholesterol: 193 mg/dL — ABNORMAL HIGH (ref 0–99)
NonHDL: 214.1
Total CHOL/HDL Ratio: 6
Triglycerides: 107 mg/dL (ref 0.0–149.0)
VLDL: 21.4 mg/dL (ref 0.0–40.0)

## 2017-07-14 NOTE — Progress Notes (Signed)
Subjective:    Patient ID: Teresa Martinez, female    DOB: 1941-01-13, 76 y.o.   MRN: [id number]  HPI 76 year old patient who is seen today for a preventive health examination and subsequent Medicare wellness visit.    She has a history of cerebrovascular disease and status postLeft pontine stroke in December 2015. She has a history of dyslipidemia but has been intolerant of statins and more recently has discontinued Zetia due to weakness and a sense of feeling poorly on this medication She denies any focal neurological symptoms.  Remains quite active with the daily exercise  Past Medical History:  Diagnosis Date  . Cervical spine degeneration    C5/C6  . Chronic right shoulder pain   . Hemiparesis affecting dominant side as late effect of cerebrovascular accident (HCC) 12/07/2014  . Hyperlipemia   . Hypertension   . Menopausal syndrome    Teresa Martinez)  . Overactive bladder   . Statin intolerance      Social History   Social History  . Marital status: Married    Spouse name: N/A  . Number of children: 4  . Years of education: N/A   Occupational History  . retired    Social History Main Topics  . Smoking status: Never Smoker  . Smokeless tobacco: Never Used  . Alcohol use No  . Drug use: No  . Sexual activity: Not on file   Other Topics Concern  . Not on file   Social History Narrative   Regular exercise, 4 children, 6 grandchildren, one stepchild and two additional stepgrandchildren   Patient is right handed.   Patient doesn't drink caffeine.    Past Surgical History:  Procedure Laterality Date  . ABDOMINAL HYSTERECTOMY     1985  . BAND HEMORRHOIDECTOMY     2010  . CHOLECYSTECTOMY     1950  . PAROTIDECTOMY     30 years ago    Family History  Problem Relation Age of Onset  . Heart failure Father   . Diabetes Mother   . Hypertension Mother   . Stroke Mother   . Cirrhosis Sister   . Hyperlipidemia Brother   . Hypertension Brother     . Stroke Brother   . Diabetes Brother     Allergies  Allergen Reactions  . Statins Other (See Comments)    Muscle pain and unable to walk  . Latex Other (See Comments)    Burns her skin    Current Outpatient Prescriptions on File Prior to Visit  Medication Sig Dispense Refill  . aspirin EC 325 MG EC tablet Take 1 tablet (325 mg total) by mouth daily. 100 tablet 1  . ESTRACE VAGINAL 0.1 MG/GM vaginal cream INSERT 1 GRAM VAGINALLY TWO TIMES A WEEK  3  . lisinopril (PRINIVIL,ZESTRIL) 20 MG tablet TAKE 1 TABLET(20 MG) BY MOUTH DAILY 90 tablet 0  . oxybutynin (DITROPAN-XL) 10 MG 24 hr tablet TAKE 1 TABLET(10 MG) BY MOUTH AT BEDTIME 30 tablet 0  . VOLTAREN 1 % GEL APPLY 2 GRAMS TOPICALLY FOUR TIMES DAILY 300 g 0   No current facility-administered medications on file prior to visit.     BP 140/82 (BP Location: Left Arm, Patient Position: Sitting, Cuff Size: Normal)   Pulse 80   Temp 97.9 F (36.6 C) (Oral)   Ht 5\' 6"  (1.676 m)   Wt 138 lb 12.8 oz (63 kg)   SpO2 98%   BMI 22.40 kg/m   Medicare wellness visit  1.  Risk factors, based on past  M,S,F history.  Current vascular risk factors include dyslipidemia and hypertension.  She has had a prior CVA  2.  Physical activities:remains quite active with daily exercises.  She is to water aerobics 3 times per week  3.  Depression/mood:no history of major depression or mood disorder  4.  Hearing:no deficits  5.  ADL's:independent  6.  Fall risk:low  7.  Home safety:no problems identified  8.  Height weight, and visual acuity;height and weight stable no change in visual acuity.  Is followed at least annually by ophthalmology.  Does have a history of mild cataracts  9.  Counseling:continue heart healthy diet  10. Lab orders based on risk factors:laboratory studies were checked in the spring except for a lipid profile.  We'll repeat  11. Referral :not appropriate at this time  12. Care plan:continue efforts at aggressive risk  factor modification  13. Cognitive assessment: alert in order with normal affect no cognitive dysfunction  14. Screening: Patient provided with a written and personalized 5-10 year screening schedule in the AVS.    15. Provider List Update: primary care ophthalmology.  GYN and dental medicine     Review of Systems  Constitutional: Negative.   HENT: Negative for congestion, dental problem, hearing loss, rhinorrhea, sinus pressure, sore throat and tinnitus.   Eyes: Negative for pain, discharge and visual disturbance.  Respiratory: Negative for cough and shortness of breath.   Cardiovascular: Negative for chest pain, palpitations and leg swelling.  Gastrointestinal: Negative for abdominal distention, abdominal pain, blood in stool, constipation, diarrhea, nausea and vomiting.  Genitourinary: Negative for difficulty urinating, dysuria, flank pain, frequency, hematuria, pelvic pain, urgency, vaginal bleeding, vaginal discharge and vaginal pain.  Musculoskeletal: Negative for arthralgias, gait problem and joint swelling.  Skin: Negative for rash.  Neurological: Negative for dizziness, syncope, speech difficulty, weakness, numbness and headaches.  Hematological: Negative for adenopathy.  Psychiatric/Behavioral: Negative for agitation, behavioral problems and dysphoric mood. The patient is not nervous/anxious.        Objective:   Physical Exam  Constitutional: She is oriented to person, place, and time. She appears well-developed and well-nourished.  HENT:  Head: Normocephalic and atraumatic.  Right Ear: External ear normal.  Left Ear: External ear normal.  Mouth/Throat: Oropharynx is clear and moist.  Eyes: Conjunctivae and EOM are normal.  Neck: Normal range of motion. Neck supple. No JVD present. No thyromegaly present.  Cardiovascular: Normal rate, regular rhythm and normal heart sounds.   No murmur heard. Pedal pulses full except for an absent left dorsalis pedis pulse    Pulmonary/Chest: Effort normal and breath sounds normal. She has no wheezes. She has no rales.  Abdominal: Soft. Bowel sounds are normal. She exhibits no distension and no mass. There is no tenderness. There is no rebound and no guarding.  Musculoskeletal: Normal range of motion. She exhibits no edema or tenderness.  Neurological: She is alert and oriented to person, place, and time. She has normal reflexes. No cranial nerve deficit. She exhibits normal muscle tone. Coordination normal.  Skin: Skin is warm and dry. No rash noted.  Psychiatric: She has a normal mood and affect. Her behavior is normal.          Assessment & Plan:   Preventive health examination Subsequent  Medicare wellness visit Essential hypertension, stable Dyslipidemia.  We'll review a lipid profile.  Patient has been intolerant of statins as well as Zetia Cerebrovascular disease.  Continue aspirin and aggressive risk factor modification  Continue home blood pressure monitoring Continue heart healthy diet Follow-up 6 months  KWIATKOWSKI,PETER Homero Fellers

## 2017-07-14 NOTE — Patient Instructions (Signed)
Limit your sodium (Salt) intake  Please check your blood pressure on a regular basis.  If it is consistently greater than 150/90, please make an office appointment.    It is important that you exercise regularly, at least 20 minutes 3 to 4 times per week.  If you develop chest pain or shortness of breath seek  medical attention.  Return in 6 months for follow-up  

## 2017-07-16 ENCOUNTER — Other Ambulatory Visit: Payer: Self-pay | Admitting: Internal Medicine

## 2017-08-01 ENCOUNTER — Ambulatory Visit: Payer: Medicare Other

## 2017-08-07 ENCOUNTER — Encounter: Payer: Self-pay | Admitting: Internal Medicine

## 2017-08-08 ENCOUNTER — Ambulatory Visit: Payer: Medicare Other

## 2017-08-12 ENCOUNTER — Other Ambulatory Visit: Payer: Self-pay | Admitting: Obstetrics

## 2017-08-12 DIAGNOSIS — E2839 Other primary ovarian failure: Secondary | ICD-10-CM

## 2017-08-28 ENCOUNTER — Ambulatory Visit
Admission: RE | Admit: 2017-08-28 | Discharge: 2017-08-28 | Disposition: A | Discharge status: Home / Self Care | Payer: Medicare Other | Source: Ambulatory Visit | Attending: Obstetrics | Admitting: Obstetrics

## 2017-08-28 DIAGNOSIS — E2839 Other primary ovarian failure: Secondary | ICD-10-CM

## 2017-09-12 ENCOUNTER — Other Ambulatory Visit: Payer: Self-pay | Admitting: Internal Medicine

## 2017-09-15 ENCOUNTER — Other Ambulatory Visit: Payer: Self-pay | Admitting: Internal Medicine

## 2017-10-12 ENCOUNTER — Other Ambulatory Visit: Payer: Self-pay | Admitting: Internal Medicine

## 2017-11-02 IMAGING — CT CT HEAD W/O CM
3 of 4 series · 19 of 47 positions shown, 23 images · non-contrast
Comparison: 10/15/2014

CLINICAL DATA: Intermittent headache, blurred vision for 2 days.

EXAM:
CT HEAD WITHOUT CONTRAST
TECHNIQUE: Contiguous axial images were obtained from the base of the skull
through the vertex without intravenous contrast.

[Series 201: head w/o, idose (1) · axial · non-contrast · 0.43mm/px · z∈[+294,+423]mm · 13 of 32 slices shown, 17 images]
[im 3/32  brain]
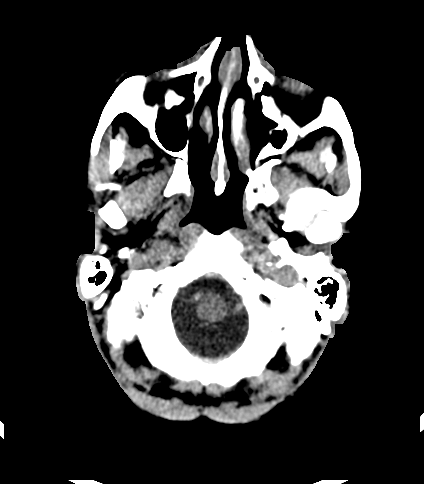
[im 3/32  bone]
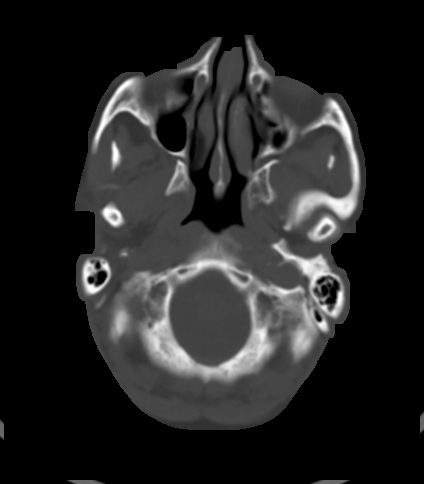
[im 5/32  brain]
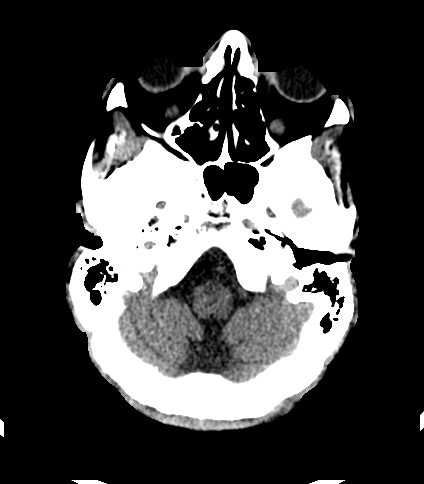
[im 7/32  brain]
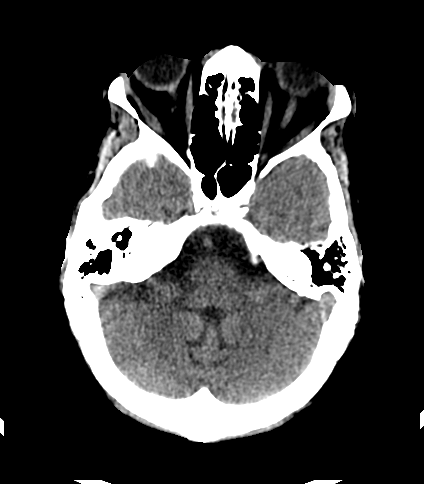
[im 9/32  brain]
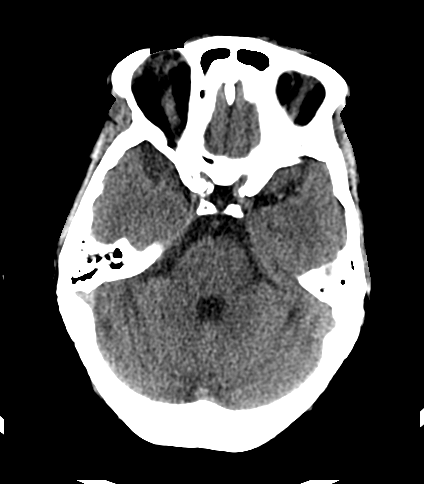
[im 12/32  brain]
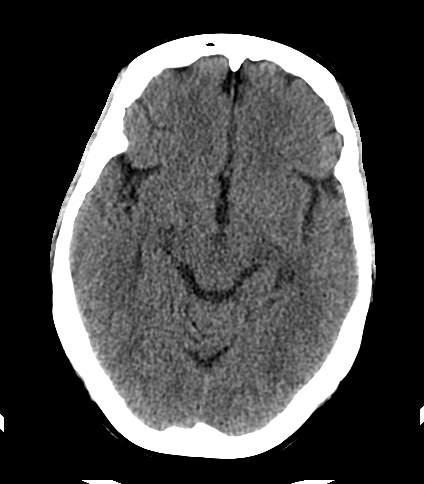
[im 12/32  bone]
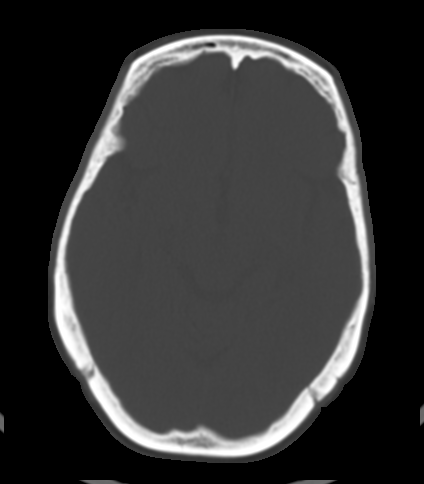
[im 14/32  brain]
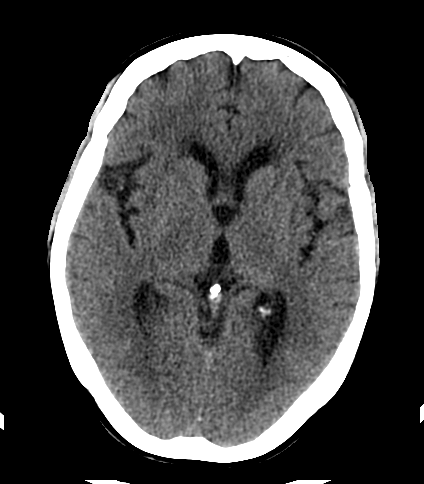
[im 16/32  brain]
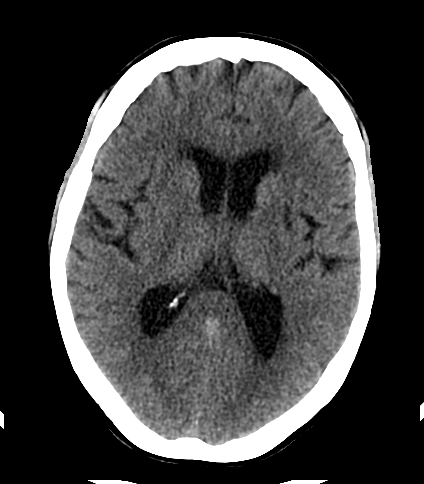
[im 18/32  brain]
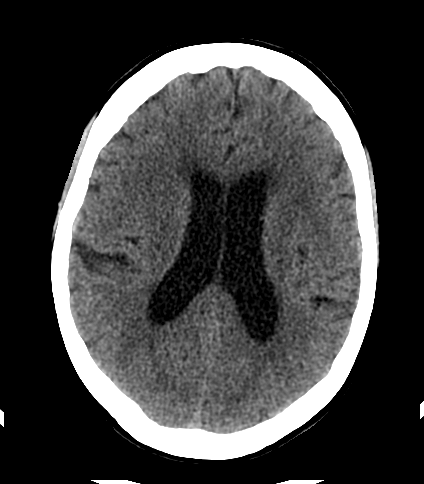
[im 20/32  brain]
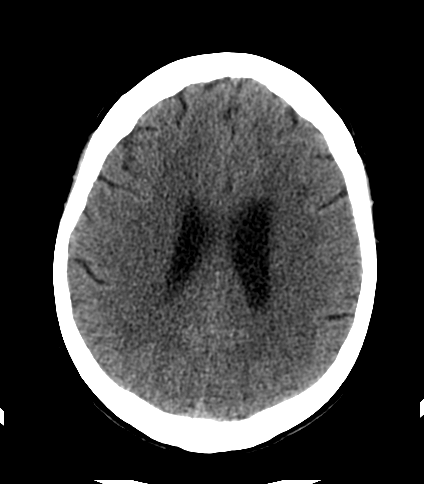
[im 20/32  bone]
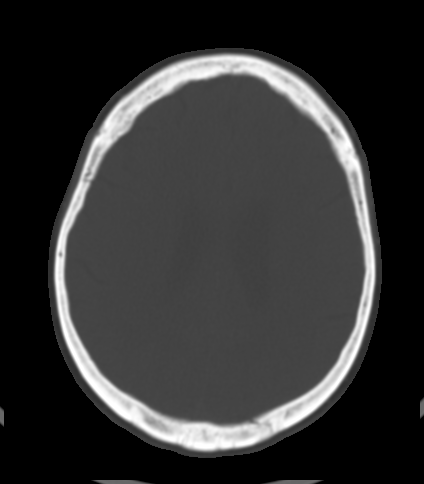
[im 23/32  brain]
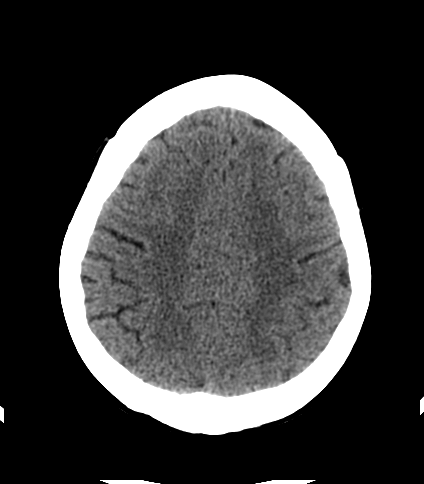
[im 25/32  brain]
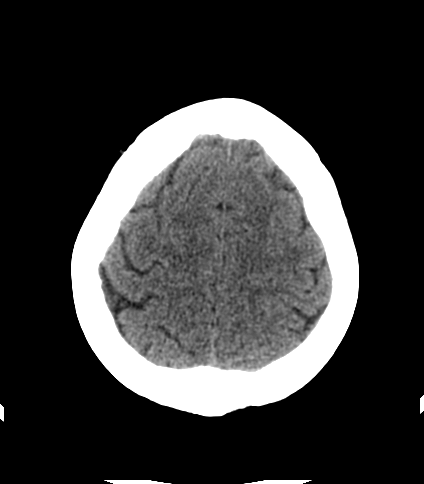
[im 27/32  brain]
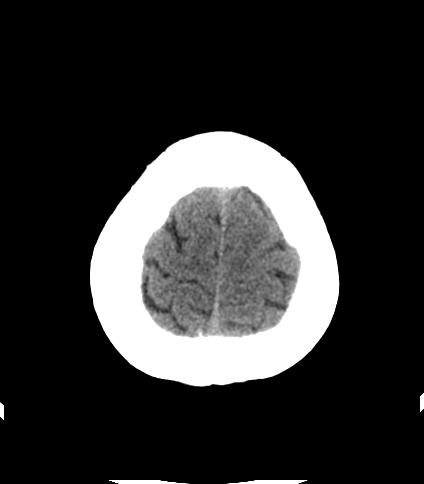
[im 29/32  brain]
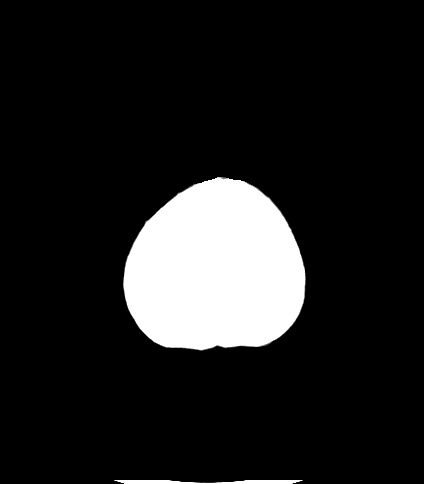
[im 29/32  bone]
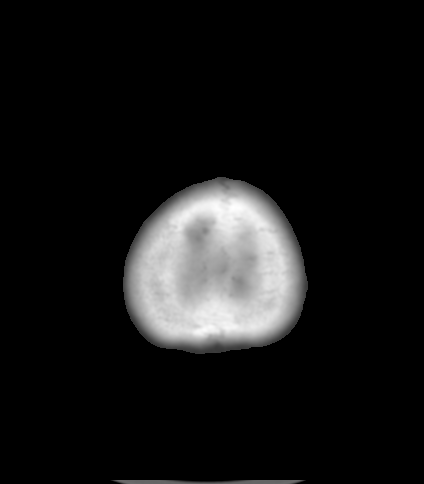

[Series 203: coronal st, idose (1) · coronal · 0.40mm/px · 3 of 69 slices shown]
[im 23/69  brain]
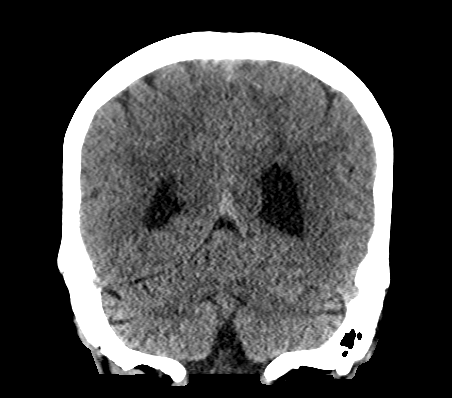
[im 31/69  brain]
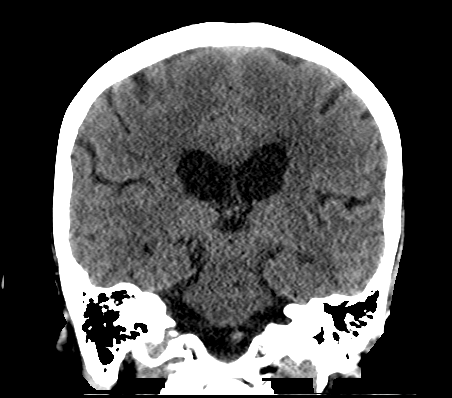
[im 38/69  brain]
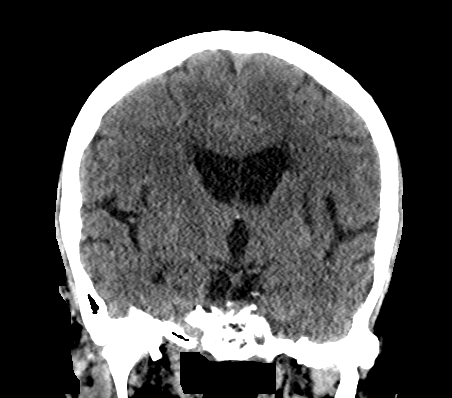

[Series 204: sagittal st, idose (1) · sagittal · 0.40mm/px · 3 of 60 slices shown]
[im 20/60  brain]
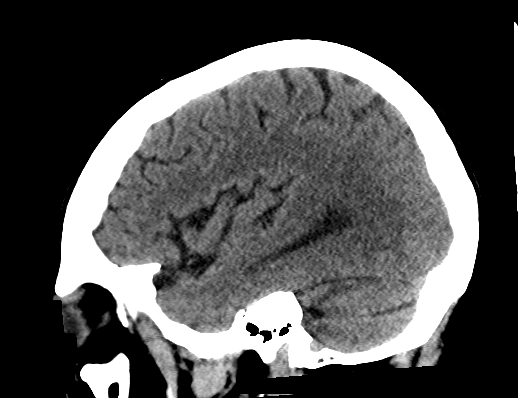
[im 30/60  brain]
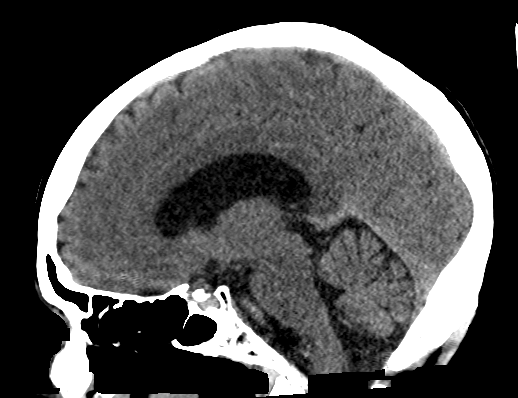
[im 40/60  brain]
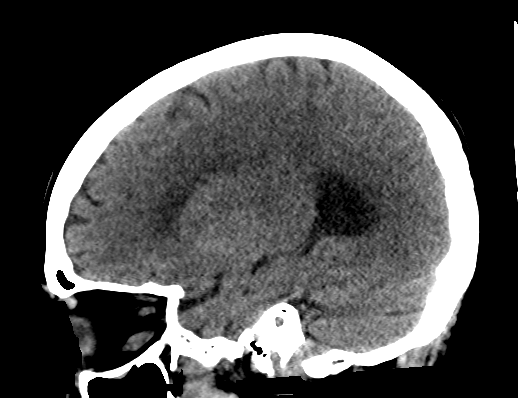

[19 of 47 positions shown; findings below may reference images not displayed]

FINDINGS: Brain: No acute intracranial abnormality. Specifically, no
hemorrhage, hydrocephalus, mass lesion, acute infarction, or
significant intracranial injury.

Vascular: No hyperdense vessel or unexpected calcification.

Skull: No acute calvarial abnormality.

Sinuses/Orbits: Visualized paranasal sinuses and mastoids clear.
Orbital soft tissues unremarkable.

Other: None
IMPRESSION: No acute intracranial abnormality.

## 2017-11-06 ENCOUNTER — Encounter: Payer: Medicare Other | Admitting: Internal Medicine

## 2017-11-13 ENCOUNTER — Telehealth: Payer: Self-pay | Admitting: Internal Medicine

## 2017-11-13 NOTE — Telephone Encounter (Signed)
Called pt. to discuss her request for Toviaz.  Reported that Oxybutinin is not effective at all; unable to go anywhere due to her poor bladder control.  Has tried Myrbetriq in past also.  Was advised by the insurance company that once she has tried and failed 2 lower tier medications for her bladder dysfunction, the Gala Murdochoviaz can be reordered, and priced at a Tier 2 level;  the pt. stated she would be able to afford this at the lower tier level.  Requesting Dr. Amador CunasKwiatkowski to order the Gala Murdochoviaz through [facility], [address]

## 2017-11-13 NOTE — Telephone Encounter (Signed)
Copied from CRM [id number]. Topic: Quick Communication - See Telephone Encounter >> Nov 13, 2017 11:15 AM Teresa Martinez, Teresa Martinez, NT wrote: CRM for notification. See Telephone encounter for: Patient calling because she would like for Dr,Kwiatkoski to call her in an order for Toviaz. If someone could give her a call back about this at [phone]  11/13/17.

## 2017-11-19 NOTE — Telephone Encounter (Signed)
Dr Kirtland BouchardK please see below message... Ok to prescribe Gala Murdochoviaz? Please advise

## 2017-11-20 ENCOUNTER — Other Ambulatory Visit: Payer: Self-pay | Admitting: Internal Medicine

## 2017-11-20 MED ORDER — FESOTERODINE FUMARATE ER 4 MG PO TB24: 2.0000 mg | ORAL_TABLET | Freq: Every day | ORAL | 5 refills | Status: DC

## 2017-11-20 NOTE — Telephone Encounter (Signed)
Toviaz 2 mg #60  One  daily as needed

## 2017-11-24 ENCOUNTER — Telehealth: Payer: Self-pay | Admitting: Internal Medicine

## 2017-11-24 ENCOUNTER — Encounter: Payer: Self-pay | Admitting: Internal Medicine

## 2017-11-24 ENCOUNTER — Other Ambulatory Visit: Payer: Self-pay | Admitting: Internal Medicine

## 2017-11-24 ENCOUNTER — Ambulatory Visit (INDEPENDENT_AMBULATORY_CARE_PROVIDER_SITE_OTHER): Payer: Medicare Other | Admitting: Internal Medicine

## 2017-11-24 VITALS — BP 124/70 | HR 81 | Temp 98.3°F | Ht 66.0 in | Wt 140.6 lb

## 2017-11-24 DIAGNOSIS — N3281 Overactive bladder: Secondary | ICD-10-CM | POA: Diagnosis not present

## 2017-11-24 DIAGNOSIS — I10 Essential (primary) hypertension: Secondary | ICD-10-CM

## 2017-11-24 DIAGNOSIS — I6350 Cerebral infarction due to unspecified occlusion or stenosis of unspecified cerebral artery: Secondary | ICD-10-CM | POA: Diagnosis not present

## 2017-11-24 DIAGNOSIS — E7800 Pure hypercholesterolemia, unspecified: Secondary | ICD-10-CM | POA: Diagnosis not present

## 2017-11-24 DIAGNOSIS — I639 Cerebral infarction, unspecified: Secondary | ICD-10-CM

## 2017-11-24 LAB — LIPID PANEL
Cholesterol: 306 mg/dL — ABNORMAL HIGH (ref 0–200)
HDL: 53.6 mg/dL (ref >39.00)
LDL Cholesterol: 229 mg/dL — ABNORMAL HIGH (ref 0–99)
NonHDL: 252.85
Total CHOL/HDL Ratio: 6
Triglycerides: 117 mg/dL (ref 0.0–149.0)
VLDL: 23.4 mg/dL (ref 0.0–40.0)

## 2017-11-24 MED ORDER — LISINOPRIL 20 MG PO TABS: ORAL_TABLET | ORAL | 3 refills | Status: DC

## 2017-11-24 MED ORDER — FESOTERODINE FUMARATE ER 4 MG PO TB24: 4.0000 mg | ORAL_TABLET | Freq: Every day | ORAL | 5 refills | Status: DC

## 2017-11-24 MED ORDER — ROSUVASTATIN CALCIUM 5 MG PO TABS: 5.0000 mg | ORAL_TABLET | Freq: Every day | ORAL | 0 refills | Status: DC

## 2017-11-24 NOTE — Telephone Encounter (Signed)
Patient states there is a fax coming to be filled out stating why she needs her medication changed - due to previously have used two other medications according to patient.  Fax needs to be filled out and faxed back.

## 2017-11-24 NOTE — Telephone Encounter (Signed)
Medication were e-scribed to the correct pharmacy.

## 2017-11-24 NOTE — Telephone Encounter (Signed)
Copied from CRM [phone]. Topic: Quick Communication - See Telephone Encounter >> Nov 24, 2017 10:40 AM Guinevere FerrariMorris, Rashaunda Rahl E, NT wrote: CRM for notification. See Telephone encounter for: Pt called and said she was in the office this morning and noticed that her medication was called in to the wrong pharmacy. Pt would like medication be called in [address] [facility] [address] Medication is fesoterodine (TOVIAZ) 4 MG TB24 tablet, lisinopril (PRINIVIL,ZESTRIL) 20 MG tablet and   11/24/17.

## 2017-11-24 NOTE — Telephone Encounter (Signed)
See pt. Request. Thanks. 

## 2017-11-24 NOTE — Patient Instructions (Addendum)
Lisinopril-take in the morning  Continue low-cholesterol diet  Try low-dose Crestor 1 tablet weekly  Return in 3 months for follow-up Cholesterol Cholesterol is a fat. Your body needs a small amount of cholesterol. Cholesterol (plaque) may build up in your blood vessels (arteries). That makes you more likely to have a heart attack or stroke. You cannot feel your cholesterol level. Having a blood test is the only way to find out if your level is high. Keep your test results. Work with your doctor to keep your cholesterol at a good level. What do the results mean?  Total cholesterol is how much cholesterol is in your blood.  LDL is bad cholesterol. This is the type that can build up. Try to have low LDL.  HDL is good cholesterol. It cleans your blood vessels and carries LDL away. Try to have high HDL.  Triglycerides are fat that the body can store or burn for energy. What are good levels of cholesterol?  Total cholesterol below 200.  LDL below 100 is good for people who have health risks. LDL below 70 is good for people who have very high risks.  HDL above 40 is good. It is best to have HDL of 60 or higher.  Triglycerides below 150. How can I lower my cholesterol? Diet Follow your diet program as told by your doctor.  Choose fish, white meat chicken, or [place] that is roasted or baked. Try not to eat red meat, fried foods, sausage, or lunch meats.  Eat lots of fresh fruits and vegetables.  Choose whole grains, beans, pasta, potatoes, and cereals.  Choose olive oil, corn oil, or canola oil. Only use small amounts.  Try not to eat butter, mayonnaise, shortening, or palm kernel oils.  Try not to eat foods with trans fats.  Choose low-fat or nonfat dairy foods. ? Drink skim or nonfat milk. ? Eat low-fat or nonfat yogurt and cheeses. ? Try not to drink whole milk or cream. ? Try not to eat ice cream, egg yolks, or full-fat cheeses.  Healthy desserts include angel food cake,  ginger snaps, animal crackers, hard candy, popsicles, and low-fat or nonfat frozen yogurt. Try not to eat pastries, cakes, pies, and cookies.  Exercise Follow your exercise program as told by your doctor.  Be more active. Try gardening, walking, and taking the stairs.  Ask your doctor about ways that you can be more active.  Medicine  Take over-the-counter and prescription medicines only as told by your doctor. This information is not intended to replace advice given to you by your health care provider. Make sure you discuss any questions you have with your health care provider. Document Released: 01/31/2009 Document Revised: 06/05/2016 Document Reviewed: 05/16/2016 Elsevier Interactive Patient Education  2018 [employer]. Fat and Cholesterol Restricted Diet High levels of fat and cholesterol in your blood may lead to various health problems, such as diseases of the heart, blood vessels, gallbladder, liver, and pancreas. Fats are concentrated sources of energy that come in various forms. Certain types of fat, including saturated fat, may be harmful in excess. Cholesterol is a substance needed by your body in small amounts. Your body makes all the cholesterol it needs. Excess cholesterol comes from the food you eat. When you have high levels of cholesterol and saturated fat in your blood, health problems can develop because the excess fat and cholesterol will gather along the walls of your blood vessels, causing them to narrow. Choosing the right foods will help you control  your intake of fat and cholesterol. This will help keep the levels of these substances in your blood within normal limits and reduce your risk of disease. What is my plan? Your health care provider recommends that you:  Limit your fat intake to ______% or less of your total calories per day.  Limit the amount of cholesterol in your diet to less than _________mg per day.  Eat 20-30 grams of fiber each day.  What types  of fat should I choose?  Choose healthy fats more often. Choose monounsaturated and polyunsaturated fats, such as olive and canola oil, flaxseeds, walnuts, almonds, and seeds.  Eat more omega-3 fats. Good choices include salmon, mackerel, sardines, tuna, flaxseed oil, and ground flaxseeds. Aim to eat fish at least two times a week.  Limit saturated fats. Saturated fats are primarily found in animal products, such as meats, butter, and cream. Plant sources of saturated fats include palm oil, palm kernel oil, and coconut oil.  Avoid foods with partially hydrogenated oils in them. These contain trans fats. Examples of foods that contain trans fats are stick margarine, some tub margarines, cookies, crackers, and other baked goods. What general guidelines do I need to follow? These guidelines for healthy eating will help you control your intake of fat and cholesterol:  Check food labels carefully to identify foods with trans fats or high amounts of saturated fat.  Fill one half of your plate with vegetables and green salads.  Fill one fourth of your plate with whole grains. Look for the word "whole" as the first word in the ingredient list.  Fill one fourth of your plate with lean protein foods.  Limit fruit to two servings a day. Choose fruit instead of juice.  Eat more foods that contain fiber, such as apples, broccoli, carrots, beans, peas, and barley.  Eat more home-cooked food and less restaurant, buffet, and fast food.  Limit or avoid alcohol.  Limit foods high in starch and sugar.  Limit fried foods.  Cook foods using methods other than frying. Baking, boiling, grilling, and broiling are all great options.  Lose weight if you are overweight. Losing just 5-10% of your initial body weight can help your overall health and prevent diseases such as diabetes and heart disease.  What foods can I eat? Grains  Whole grains, such as whole wheat or whole grain breads, crackers, cereals,  and pasta. Unsweetened oatmeal, bulgur, barley, quinoa, or brown rice. Corn or whole wheat flour tortillas. Vegetables  Fresh or frozen vegetables (raw, steamed, roasted, or grilled). Green salads. Fruits  All fresh, canned (in natural juice), or frozen fruits. Meats and other protein foods  Ground beef (85% or leaner), grass-fed beef, or beef trimmed of fat. Skinless chicken or [place]. Ground chicken or [place]. Pork trimmed of fat. All fish and seafood. Eggs. Dried beans, peas, or lentils. Unsalted nuts or seeds. Unsalted canned or dry beans. Dairy  Low-fat dairy products, such as skim or 1% milk, 2% or reduced-fat cheeses, low-fat ricotta or cottage cheese, or plain low-fat yo Fats and oils  Tub margarines without trans fats. Light or reduced-fat mayonnaise and salad dressings. Avocado. Olive, canola, sesame, or safflower oils. Natural peanut or almond butter (choose ones without added sugar and oil). The items listed above may not be a complete list of recommended foods or beverages. Contact your dietitian for more options. Foods to avoid Grains  White bread. White pasta. White rice. Cornbread. Bagels, pastries, and croissants. Crackers that contain trans fat. Vegetables  White potatoes. Corn. Creamed or fried vegetables. Vegetables in a cheese sauce. Fruits  Dried fruits. Canned fruit in light or heavy syrup. Fruit juice. Meats and other protein foods  Fatty cuts of meat. Ribs, chicken wings, bacon, sausage, bologna, salami, chitterlings, fatback, hot dogs, bratwurst, and packaged luncheon meats. Liver and organ meats. Dairy  Whole or 2% milk, cream, half-and-half, and cream cheese. Whole milk cheeses. Whole-fat or sweetened yogurt. Full-fat cheeses. Nondairy creamers and whipped toppings. Processed cheese, cheese spreads, or cheese curds. Beverages  Alcohol. Sweetened drinks (such as sodas, lemonade, and fruit drinks or punches). Fats and oils  Butter, stick margarine,  lard, shortening, ghee, or bacon fat. Coconut, palm kernel, or palm oils. Sweets and desserts  Corn syrup, sugars, honey, and molasses. Candy. Jam and jelly. Syrup. Sweetened cereals. Cookies, pies, cakes, donuts, muffins, and ice cream. The items listed above may not be a complete list of foods and beverages to avoid. Contact your dietitian for more information. This information is not intended to replace advice given to you by your health care provider. Make sure you discuss any questions you have with your health care provider. Document Released: 11/04/2005 Document Revised: 11/25/2014 Document Reviewed: 02/02/2014 Elsevier Interactive Patient Education  [employer].

## 2017-11-24 NOTE — Progress Notes (Signed)
Subjective:    Patient ID: Teresa SchaumannFrances H Martinez, female    DOB: 07/28/1941, 77 y.o.   MRN: [id number]  HPI   77 year old patient who is seen today for follow-up. She has a history of hypercholesterolemia and has not done well on statins and also self discontinued Zetia. She has hypertension.  She does take lisinopril at bedtime and does complain of some early morning weakness.  She is complaining of hip and knee pain. She does have a history of overactive bladder and also a component of neurogenic bladder. Request a trial of Toviaz.  Past Medical History:  Diagnosis Date  . Cervical spine degeneration    C5/C6  . Chronic right shoulder pain   . Hemiparesis affecting dominant side as late effect of cerebrovascular accident (HCC) 12/07/2014  . Hyperlipemia   . Hypertension   . Menopausal syndrome    Teresa Martinez(Teresa Martinez)  . Overactive bladder   . Statin intolerance      Social History   Socioeconomic History  . Marital status: Married    Spouse name: Not on file  . Number of children: 4  . Years of education: Not on file  . Highest education level: Not on file  Social Needs  . Financial resource strain: Not on file  . Food insecurity - worry: Not on file  . Food insecurity - inability: Not on file  . Transportation needs - medical: Not on file  . Transportation needs - non-medical: Not on file  Occupational History  . Occupation: retired  Tobacco Use  . Smoking status: Never Smoker  . Smokeless tobacco: Never Used  Substance and Sexual Activity  . Alcohol use: No  . Drug use: No  . Sexual activity: Not on file  Other Topics Concern  . Not on file  Social History Narrative   Regular exercise, 4 children, 6 grandchildren, one stepchild and two additional stepgrandchildren   Patient is right handed.   Patient doesn't drink caffeine.    Past Surgical History:  Procedure Laterality Date  . ABDOMINAL HYSTERECTOMY     1985  . BAND HEMORRHOIDECTOMY     2010  .  CHOLECYSTECTOMY     1950  . PAROTIDECTOMY     30 years ago    Family History  Problem Relation Age of Onset  . Heart failure Father   . Diabetes Mother   . Hypertension Mother   . Stroke Mother   . Cirrhosis Sister   . Hyperlipidemia Brother   . Hypertension Brother   . Stroke Brother   . Diabetes Brother     Allergies  Allergen Reactions  . Statins Other (See Comments)    Muscle pain and unable to walk  . Latex Other (See Comments)    Burns her skin    Current Outpatient Medications on File Prior to Visit  Medication Sig Dispense Refill  . aspirin EC 325 MG EC tablet Take 1 tablet (325 mg total) by mouth daily. 100 tablet 1  . ESTRACE VAGINAL 0.1 MG/GM vaginal cream INSERT 1 GRAM VAGINALLY TWO TIMES A WEEK  3  . fesoterodine (TOVIAZ) 4 MG TB24 tablet Take 1 tablet (4 mg total) by mouth daily. 30 tablet 5  . lisinopril (PRINIVIL,ZESTRIL) 20 MG tablet TAKE 1 TABLET(20 MG) BY MOUTH DAILY 90 tablet 0  . VOLTAREN 1 % GEL APPLY 2 GRAMS TOPICALLY FOUR TIMES DAILY (Patient not taking: Reported on 11/24/2017) 300 g 0   No current facility-administered medications on file prior to visit.  BP 124/70 (BP Location: Left Arm, Patient Position: Sitting, Cuff Size: Normal)   Pulse 81   Temp 98.3 F (36.8 C) (Oral)   Ht 5\' 6"  (1.676 m)   Wt 140 lb 9.6 oz (63.8 kg)   SpO2 98%   BMI 22.69 kg/m    Review of Systems  Constitutional: Positive for fatigue.  HENT: Negative for congestion, dental problem, hearing loss, rhinorrhea, sinus pressure, sore throat and tinnitus.   Eyes: Negative for pain, discharge and visual disturbance.  Respiratory: Negative for cough and shortness of breath.   Cardiovascular: Negative for chest pain, palpitations and leg swelling.  Gastrointestinal: Negative for abdominal distention, abdominal pain, blood in stool, constipation, diarrhea, nausea and vomiting.  Genitourinary: Positive for frequency. Negative for difficulty urinating, dysuria, flank pain,  hematuria, pelvic pain, urgency, vaginal bleeding, vaginal discharge and vaginal pain.  Musculoskeletal: Positive for arthralgias and gait problem. Negative for joint swelling.  Skin: Negative for rash.  Neurological: Positive for weakness. Negative for dizziness, syncope, speech difficulty, numbness and headaches.  Hematological: Negative for adenopathy.  Psychiatric/Behavioral: Negative for agitation, behavioral problems and dysphoric mood. The patient is not nervous/anxious.        Objective:   Physical Exam  Constitutional: She is oriented to person, place, and time. She appears well-developed and well-nourished.  HENT:  Head: Normocephalic.  Right Ear: External ear normal.  Left Ear: External ear normal.  Mouth/Throat: Oropharynx is clear and moist.  Eyes: Conjunctivae and EOM are normal. Pupils are equal, round, and reactive to light.  Neck: Normal range of motion. Neck supple. No thyromegaly present.  Cardiovascular: Normal rate, regular rhythm, normal heart sounds and intact distal pulses.  Pulmonary/Chest: Effort normal and breath sounds normal.  Abdominal: Soft. Bowel sounds are normal. She exhibits no mass. There is no tenderness.  Musculoskeletal: Normal range of motion.  Lymphadenopathy:    She has no cervical adenopathy.  Neurological: She is alert and oriented to person, place, and time.  Skin: Skin is warm and dry. No rash noted.  Psychiatric: She has a normal mood and affect. Her behavior is normal.          Assessment & Plan:   Hypercholesterolemia.  Will rechallenge with once weekly Crestor Hypertension well-controlled Symptomatic osteoarthritis Overactive bladder.  Follow-up 6 months  Teresa Martinez

## 2017-12-01 ENCOUNTER — Telehealth: Payer: Self-pay | Admitting: *Deleted

## 2017-12-01 NOTE — Telephone Encounter (Signed)
Copied from CRM [id number]. Topic: General - Other >> Dec 01, 2017 12:01 PM Stephannie LiSimmons, Janett L, NT wrote: Reason for CRM: Patient wants a call back from her PCP S nurse [phone] she will say anything other than she wants some from Dr Lesia HausenKwiatowski

## 2017-12-03 NOTE — Telephone Encounter (Signed)
Pt called in to follow up on call back request.    Please advise.

## 2017-12-04 NOTE — Telephone Encounter (Signed)
Pt was made aware that fax was completed and faxed.  Pt would like a referral to a dietician d/t her cholesterol. Please advise.

## 2017-12-04 NOTE — Telephone Encounter (Signed)
Please see other phone note

## 2017-12-23 ENCOUNTER — Other Ambulatory Visit: Payer: Self-pay | Admitting: Internal Medicine

## 2017-12-23 DIAGNOSIS — E7800 Pure hypercholesterolemia, unspecified: Secondary | ICD-10-CM

## 2018-02-19 ENCOUNTER — Ambulatory Visit: Payer: Medicare Other | Admitting: Internal Medicine

## 2018-02-24 ENCOUNTER — Encounter: Payer: Self-pay | Admitting: Internal Medicine

## 2018-02-24 ENCOUNTER — Ambulatory Visit (INDEPENDENT_AMBULATORY_CARE_PROVIDER_SITE_OTHER): Payer: Medicare Other | Admitting: Internal Medicine

## 2018-02-24 VITALS — BP 148/82 | HR 71 | Temp 98.1°F | Wt 135.0 lb

## 2018-02-24 DIAGNOSIS — I639 Cerebral infarction, unspecified: Secondary | ICD-10-CM

## 2018-02-24 DIAGNOSIS — E7800 Pure hypercholesterolemia, unspecified: Secondary | ICD-10-CM | POA: Diagnosis not present

## 2018-02-24 DIAGNOSIS — I10 Essential (primary) hypertension: Secondary | ICD-10-CM | POA: Diagnosis not present

## 2018-02-24 DIAGNOSIS — I6350 Cerebral infarction due to unspecified occlusion or stenosis of unspecified cerebral artery: Secondary | ICD-10-CM | POA: Diagnosis not present

## 2018-02-24 LAB — LIPID PANEL
Cholesterol: 255 mg/dL — ABNORMAL HIGH (ref 0–200)
HDL: 50.5 mg/dL (ref >39.00)
LDL Cholesterol: 188 mg/dL — ABNORMAL HIGH (ref 0–99)
NonHDL: 204.62
Total CHOL/HDL Ratio: 5
Triglycerides: 82 mg/dL (ref 0.0–149.0)
VLDL: 16.4 mg/dL (ref 0.0–40.0)

## 2018-02-24 MED ORDER — EZETIMIBE 10 MG PO TABS: 10.0000 mg | ORAL_TABLET | Freq: Every day | ORAL | 5 refills | Status: DC

## 2018-02-24 NOTE — Patient Instructions (Addendum)
Limit your sodium (Salt) intake   High Cholesterol High cholesterol is a condition in which the blood has high levels of a white, waxy, fat-like substance (cholesterol). The human body needs small amounts of cholesterol. The liver makes all the cholesterol that the body needs. Extra (excess) cholesterol comes from the food that we eat. Cholesterol is carried from the liver by the blood through the blood vessels. If you have high cholesterol, deposits (plaques) may build up on the walls of your blood vessels (arteries). Plaques make the arteries narrower and stiffer. Cholesterol plaques increase your risk for heart attack and stroke. Work with your health care provider to keep your cholesterol levels in a healthy range. What increases the risk? This condition is more likely to develop in people who:  Eat foods that are high in animal fat (saturated fat) or cholesterol.  Are overweight.  Are not getting enough exercise.  Have a family history of high cholesterol.  What are the signs or symptoms? There are no symptoms of this condition. How is this diagnosed? This condition may be diagnosed from the results of a blood test.  If you are older than age 79, your health care provider may check your cholesterol every 4-6 years.  You may be checked more often if you already have high cholesterol or other risk factors for heart disease.  The blood test for cholesterol measures:  "Bad" cholesterol (LDL cholesterol). This is the main type of cholesterol that causes heart disease. The desired level for LDL is less than 100.  "Good" cholesterol (HDL cholesterol). This type helps to protect against heart disease by cleaning the arteries and carrying the LDL away. The desired level for HDL is 60 or higher.  Triglycerides. These are fats that the body can store or burn for energy. The desired number for triglycerides is lower than 150.  Total cholesterol. This is a measure of the total amount of  cholesterol in your blood, including LDL cholesterol, HDL cholesterol, and triglycerides. A healthy number is less than 200.  How is this treated? This condition is treated with diet changes, lifestyle changes, and medicines. Diet changes  This may include eating more whole grains, fruits, vegetables, nuts, and fish.  This may also include cutting back on red meat and foods that have a lot of added sugar. Lifestyle changes  Changes may include getting at least 40 minutes of aerobic exercise 3 times a week. Aerobic exercises include walking, biking, and swimming. Aerobic exercise along with a healthy diet can help you maintain a healthy weight.  Changes may also include quitting smoking. Medicines  Medicines are usually given if diet and lifestyle changes have failed to reduce your cholesterol to healthy levels.  Your health care provider may prescribe a statin medicine. Statin medicines have been shown to reduce cholesterol, which can reduce the risk of heart disease. Follow these instructions at home: Eating and drinking  If told by your health care provider:  Eat chicken (without skin), fish, veal, shellfish, ground [place] breast, and round or loin cuts of red meat.  Do not eat fried foods or fatty meats, such as hot dogs and salami.  Eat plenty of fruits, such as apples.  Eat plenty of vegetables, such as broccoli, potatoes, and carrots.  Eat beans, peas, and lentils.  Eat grains such as barley, rice, couscous, and bulgur wheat.  Eat pasta without cream sauces.  Use skim or nonfat milk, and eat low-fat or nonfat yogurt and cheeses.  Do not  eat or drink whole milk, cream, ice cream, egg yolks, or hard cheeses.  Do not eat stick margarine or tub margarines that contain trans fats (also called partially hydrogenated oils).  Do not eat saturated tropical oils, such as coconut oil and palm oil.  Do not eat cakes, cookies, crackers, or other baked goods that contain trans  fats.  General instructions  Exercise as directed by your health care provider. Increase your activity level with activities such as gardening, walking, and taking the stairs.  Take over-the-counter and prescription medicines only as told by your health care provider.  Do not use any products that contain nicotine or tobacco, such as cigarettes and e-cigarettes. If you need help quitting, ask your health care provider.  Keep all follow-up visits as told by your health care provider. This is important. Contact a health care provider if:  You are struggling to maintain a healthy diet or weight.  You need help to start on an exercise program.  You need help to stop smoking. Get help right away if:  You have chest pain.  You have trouble breathing. This information is not intended to replace advice given to you by your health care provider. Make sure you discuss any questions you have with your health care provider. Document Released: 11/04/2005 Document Revised: 06/01/2016 Document Reviewed: 05/04/2016 Elsevier Interactive Patient Education  2018 [employer] Inc.  Preventing High Cholesterol Cholesterol is a waxy, fat-like substance that your body needs in small amounts. Your liver makes all the cholesterol that your body needs. Having high cholesterol (hypercholesterolemia) increases your risk for heart disease and stroke. Extra (excess) cholesterol comes from the food you eat, such as animal-based fat (saturated fat) from meat and some dairy products. High cholesterol can often be prevented with diet and lifestyle changes. If you already have high cholesterol, you can control it with diet and lifestyle changes, as well as medicine. What nutrition changes can be made?  Eat less saturated fat. Foods that contain saturated fat include red meat and some dairy products.  Avoid processed meats, like bacon and lunch meats.  Avoid trans fats, which are found in margarine and some baked  goods.  Avoid foods and beverages that have added sugars.  Eat more fruits, vegetables, and whole grains.  Choose healthy sources of protein, such as fish, poultry, and nuts.  Choose healthy sources of fat, such as: ? Nuts. ? Vegetable oils, especially olive oil. ? Fish that have healthy fats (omega-3 fatty acids), such as mackerel or salmon. What lifestyle changes can be made?  Lose weight if you are overweight. Losing 5-10 lb (2.3-4.5 kg) can help prevent or control high cholesterol and reduce your risk for diabetes and high blood pressure. Ask your health care provider to help you with a diet and exercise plan to safely lose weight.  Get enough exercise. Do at least 150 minutes of moderate-intensity exercise each week. ? You could do this in short exercise sessions several times a day, or you could do longer exercise sessions a few times a week. For example, you could take a brisk 10-minute walk or bike ride, 3 times a day, for 5 days a week.  Do not smoke. If you need help quitting, ask your health care provider.  Limit your alcohol intake. If you drink alcohol, limit alcohol intake to no more than 1 drink a day for nonpregnant women and 2 drinks a day for men. One drink equals 12 oz of beer, 5 oz of wine,  or 1 oz of hard liquor. Why are these changes important? If you have high cholesterol, deposits (plaques) may build up on the walls of your blood vessels. Plaques make the arteries narrower and stiffer, which can restrict or block blood flow and cause blood clots to form. This greatly increases your risk for heart attack and stroke. Making diet and lifestyle changes can reduce your risk for these life-threatening conditions. What can I do to lower my risk?  Manage your risk factors for high cholesterol. Talk with your health care provider about all of your risk factors and how to lower your risk.  Manage other conditions that you have, such as diabetes or high blood pressure  (hypertension).  Have your cholesterol checked at regular intervals.  Keep all follow-up visits as told by your health care provider. This is important. How is this treated? In addition to diet and lifestyle changes, your health care provider may recommend medicines to help lower cholesterol, such as a medicine to reduce the amount of cholesterol made in your liver. You may need medicine if:  Diet and lifestyle changes do not lower your cholesterol enough.  You have high cholesterol and other risk factors for heart disease or stroke.  Take over-the-counter and prescription medicines only as told by your health care provider. Where to find more information:  American Heart Association: 1122334455.jsp  National Heart, Lung, and Blood Institute: http://hood.com/ Summary  High cholesterol increases your risk for heart disease and stroke. By keeping your cholesterol level low, you can reduce your risk for these conditions.  Diet and lifestyle changes are the most important steps in preventing high cholesterol.  Work with your health care provider to manage your risk factors, and have your blood tested regularly. This information is not intended to replace advice given to you by your health care provider. Make sure you discuss any questions you have with your health care provider. Document Released: 11/19/2015 Document Revised: 07/13/2016 Document Reviewed: 07/13/2016 Elsevier Interactive Patient Education  2018 [employer].  Fat and Cholesterol Restricted Diet Getting too much fat and cholesterol in your diet may cause health problems. Following this diet helps keep your fat and cholesterol at normal levels. This can keep you from getting sick. What types of fat should I choose?  Choose monosaturated and polyunsaturated fats. These are found in foods such as  olive oil, canola oil, flaxseeds, walnuts, almonds, and seeds.  Eat more omega-3 fats. Good choices include salmon, mackerel, sardines, tuna, flaxseed oil, and ground flaxseeds.  Limit saturated fats. These are in animal products such as meats, butter, and cream. They can also be in plant products such as palm oil, palm kernel oil, and coconut oil.  Avoid foods with partially hydrogenated oils in them. These contain trans fats. Examples of foods that have trans fats are stick margarine, some tub margarines, cookies, crackers, and other baked goods. What general guidelines do I need to follow?  Check food labels. Look for the words "trans fat" and "saturated fat."  When preparing a meal: ? Fill half of your plate with vegetables and green salads. ? Fill one fourth of your plate with whole grains. Look for the word "whole" as the first word in the ingredient list. ? Fill one fourth of your plate with lean protein foods.  Eat more foods that have fiber, like apples, carrots, beans, peas, and barley.  Eat more home-cooked foods. Eat less at restaurants and buffets.  Limit or avoid alcohol.  Limit foods high  in starch and sugar.  Limit fried foods.  Cook foods without frying them. Baking, boiling, grilling, and broiling are all great options.  Lose weight if you are overweight. Losing even a small amount of weight can help your overall health. It can also help prevent diseases such as diabetes and heart disease. What foods can I eat? Grains Whole grains, such as whole wheat or whole grain breads, crackers, cereals, and pasta. Unsweetened oatmeal, bulgur, barley, quinoa, or brown rice. Corn or whole wheat flour tortillas. Vegetables Fresh or frozen vegetables (raw, steamed, roasted, or grilled). Green salads. Fruits All fresh, canned (in natural juice), or frozen fruits. Meat and Other Protein Products Ground beef (85% or leaner), grass-fed beef, or beef trimmed of fat. Skinless  chicken or [place]. Ground chicken or [place]. Pork trimmed of fat. All fish and seafood. Eggs. Dried beans, peas, or lentils. Unsalted nuts or seeds. Unsalted canned or dry beans. Dairy Low-fat dairy products, such as skim or 1% milk, 2% or reduced-fat cheeses, low-fat ricotta or cottage cheese, or plain low-fat yogurt. Fats and Oils Tub margarines without trans fats. Light or reduced-fat mayonnaise and salad dressings. Avocado. Olive, canola, sesame, or safflower oils. Natural peanut or almond butter (choose ones without added sugar and oil). The items listed above may not be a complete list of recommended foods or beverages. Contact your dietitian for more options. What foods are not recommended? Grains White bread. White pasta. White rice. Cornbread. Bagels, pastries, and croissants. Crackers that contain trans fat. Vegetables White potatoes. Corn. Creamed or fried vegetables. Vegetables in a cheese sauce. Fruits Dried fruits. Canned fruit in light or heavy syrup. Fruit juice. Meat and Other Protein Products Fatty cuts of meat. Ribs, chicken wings, bacon, sausage, bologna, salami, chitterlings, fatback, hot dogs, bratwurst, and packaged luncheon meats. Liver and organ meats. Dairy Whole or 2% milk, cream, half-and-half, and cream cheese. Whole milk cheeses. Whole-fat or sweetened yogurt. Full-fat cheeses. Nondairy creamers and whipped toppings. Processed cheese, cheese spreads, or cheese curds. Sweets and Desserts Corn syrup, sugars, honey, and molasses. Candy. Jam and jelly. Syrup. Sweetened cereals. Cookies, pies, cakes, donuts, muffins, and ice cream. Fats and Oils Butter, stick margarine, lard, shortening, ghee, or bacon fat. Coconut, palm kernel, or palm oils. Beverages Alcohol. Sweetened drinks (such as sodas, lemonade, and fruit drinks or punches). The items listed above may not be a complete list of foods and beverages to avoid. Contact your dietitian for more information. This  information is not intended to replace advice given to you by your health care provider. Make sure you discuss any questions you have with your health care provider. Document Released: 05/05/2012 Document Revised: 07/11/2016 Document Reviewed: 02/03/2014 Elsevier Interactive Patient Education  [employer]2018 Else[employer] Inc.

## 2018-02-24 NOTE — Progress Notes (Signed)
Subjective:    Patient ID: Teresa Martinez, female    DOB: 02/22/1941, 77 y.o.   MRN: [id number]  HPI 77 year old patient who has a history of hypercholesterolemia as well as cerebrovascular disease. Unfortunately she has been intolerant of statins.  Presently is on Crestor once a week which she is unable to tolerate.  This was discontinued today and the patient resumed on Zetia. She is requesting referral to a dietitian for a low cholesterol diet  Past Medical History:  Diagnosis Date  . Cervical spine degeneration    C5/C6  . Chronic right shoulder pain   . Hemiparesis affecting dominant side as late effect of cerebrovascular accident (HCC) 12/07/2014  . Hyperlipemia   . Hypertension   . Menopausal syndrome    Teresa Martinez(Teresa Martinez)  . Overactive bladder   . Statin intolerance      Social History   Socioeconomic History  . Marital status: Married    Spouse name: Not on file  . Number of children: 4  . Years of education: Not on file  . Highest education level: Not on file  Occupational History  . Occupation: retired  Engineer, productionocial Needs  . Financial resource strain: Not on file  . Food insecurity:    Worry: Not on file    Inability: Not on file  . Transportation needs:    Medical: Not on file    Non-medical: Not on file  Tobacco Use  . Smoking status: Never Smoker  . Smokeless tobacco: Never Used  Substance and Sexual Activity  . Alcohol use: No  . Drug use: No  . Sexual activity: Not on file  Lifestyle  . Physical activity:    Days per week: Not on file    Minutes per session: Not on file  . Stress: Not on file  Relationships  . Social connections:    Talks on phone: Not on file    Gets together: Not on file    Attends religious service: Not on file    Active member of club or organization: Not on file    Attends meetings of clubs or organizations: Not on file    Relationship status: Not on file  . Intimate partner violence:    Fear of current or ex  partner: Not on file    Emotionally abused: Not on file    Physically abused: Not on file    Forced sexual activity: Not on file  Other Topics Concern  . Not on file  Social History Narrative   Regular exercise, 4 children, 6 grandchildren, one stepchild and two additional stepgrandchildren   Patient is right handed.   Patient doesn't drink caffeine.    Past Surgical History:  Procedure Laterality Date  . ABDOMINAL HYSTERECTOMY     1985  . BAND HEMORRHOIDECTOMY     2010  . CHOLECYSTECTOMY     1950  . PAROTIDECTOMY     30 years ago    Family History  Problem Relation Age of Onset  . Heart failure Father   . Diabetes Mother   . Hypertension Mother   . Stroke Mother   . Cirrhosis Sister   . Hyperlipidemia Brother   . Hypertension Brother   . Stroke Brother   . Diabetes Brother     Allergies  Allergen Reactions  . Statins Other (See Comments)    Muscle pain and unable to walk  . Latex Other (See Comments)    Burns her skin    Current Outpatient Medications  on File Prior to Visit  Medication Sig Dispense Refill  . aspirin EC 325 MG EC tablet Take 1 tablet (325 mg total) by mouth daily. 100 tablet 1  . ESTRACE VAGINAL 0.1 MG/GM vaginal cream INSERT 1 GRAM VAGINALLY TWO TIMES A WEEK  3  . fesoterodine (TOVIAZ) 4 MG TB24 tablet Take 1 tablet (4 mg total) by mouth daily. 30 tablet 5  . lisinopril (PRINIVIL,ZESTRIL) 20 MG tablet TAKE 1 TABLET(20 MG) BY MOUTH DAILY 90 tablet 3  . rosuvastatin (CRESTOR) 5 MG tablet TAKE 1 TABLET BY MOUTH ONCE DAILY 90 tablet 2  . VOLTAREN 1 % GEL APPLY 2 GRAMS TOPICALLY FOUR TIMES DAILY 300 g 0   No current facility-administered medications on file prior to visit.     BP (!) 148/82 (BP Location: Right Arm, Patient Position: Sitting, Cuff Size: Normal)   Pulse 71   Temp 98.1 F (36.7 C) (Oral)   Wt 135 lb (61.2 kg)   SpO2 98%   BMI 21.79 kg/m      Review of Systems  Constitutional: Negative.   HENT: Negative for congestion,  dental problem, hearing loss, rhinorrhea, sinus pressure, sore throat and tinnitus.   Eyes: Negative for pain, discharge and visual disturbance.  Respiratory: Negative for cough and shortness of breath.   Cardiovascular: Negative for chest pain, palpitations and leg swelling.  Gastrointestinal: Negative for abdominal distention, abdominal pain, blood in stool, constipation, diarrhea, nausea and vomiting.  Genitourinary: Negative for difficulty urinating, dysuria, flank pain, frequency, hematuria, pelvic pain, urgency, vaginal bleeding, vaginal discharge and vaginal pain.  Musculoskeletal: Positive for myalgias. Negative for arthralgias, gait problem and joint swelling.  Skin: Negative for rash.  Neurological: Negative for dizziness, syncope, speech difficulty, weakness, numbness and headaches.  Hematological: Negative for adenopathy.  Psychiatric/Behavioral: Negative for agitation, behavioral problems and dysphoric mood. The patient is not nervous/anxious.        Objective:   Physical Exam  Constitutional: She is oriented to person, place, and time. She appears well-developed and well-nourished.  Blood pressure 140/80  HENT:  Head: Normocephalic.  Right Ear: External ear normal.  Left Ear: External ear normal.  Mouth/Throat: Oropharynx is clear and moist.  Eyes: Pupils are equal, round, and reactive to light. Conjunctivae and EOM are normal.  Neck: Normal range of motion. Neck supple. No thyromegaly present.  Cardiovascular: Normal rate, regular rhythm, normal heart sounds and intact distal pulses.  Pulmonary/Chest: Effort normal and breath sounds normal.  Abdominal: Soft. Bowel sounds are normal. She exhibits no mass. There is no tenderness.  Musculoskeletal: Normal range of motion.  Lymphadenopathy:    She has no cervical adenopathy.  Neurological: She is alert and oriented to person, place, and time.  Skin: Skin is warm and dry. No rash noted.  Psychiatric: She has a normal mood  and affect. Her behavior is normal.          Assessment & Plan:   Essential hypertension. Hypercholesterolemia.  Once weekly Crestor discontinued and patient resumed on Zetia.  We will set up dietary consult per patient request for a low-cholesterol low-fat diet Cerebrovascular disease  CPX September  Rogelia Boga

## 2018-03-12 ENCOUNTER — Encounter: Payer: Medicare Other | Attending: Internal Medicine

## 2018-03-12 DIAGNOSIS — Z713 Dietary counseling and surveillance: Secondary | ICD-10-CM | POA: Insufficient documentation

## 2018-03-12 DIAGNOSIS — E7800 Pure hypercholesterolemia, unspecified: Secondary | ICD-10-CM

## 2018-03-12 NOTE — Progress Notes (Signed)
Medical Nutrition Therapy:  Appt start time: 1145 end time:  1245.  Assessment:  Primary concerns today: hypercholesterolemia.  Looking for a way to keep cholesterol low without the use of statins. Pt with adverse effects to this drug classifications. Pt s/p stroke in 2015, reported she recovered fairly quickly utilizing at-home therapies. Prior to this pt was highly active as a long-distance runner, reports her weight status is stable and she wears the same clothes she always has. Pt has recently made some lifestyle changes.   Pt consumes 3 homemade meals per day and has no eaten out at all since being place on current diet. Pt brought handout of diet recommendations from [employer] and expressed how strict guidelines were. RD discussed some options for liberalizing the diet slightly while still monitoring some aspects. Uses Mrs.Dash seasonings instead of salt. Pt has not eaten deli meat products but made sliced Mal[place]urkey breast at home. Pt eats canned goods, recommended reading food labels.   Preferred Learning Style:   No preference indicated   Learning Readiness:   Change in progress  MEDICATIONS: Reviewed    DIETARY INTAKE:   Usual eating pattern includes 3 meals and 0-1 snacks per day.  Everyday foods include oatmeal.  Avoided foods include red meat, eggs.    24-hr recall:  B ( AM): bowl of oatmeal, blueberries, prunes, skim or 1% milk - Piece of toast with apple butter - coffee with cream and blue agave Snk ( AM): Piece of fruit (small banana/grapes) L ( PM): Sandwich or sardine and crackers Snk ( PM): None reported D ( PM): green vegetable, salad (spinach/tomatoes/carrots/cucumbers), meat, sweet potato  Snk ( PM): None reported  Beverages: one Boost, one-two cups oat milk, pomegranate juice (2-3 times a week), water  Usual physical activity: water aerobics 2x/wk, walks a mile 3x/wk, floor exercises to "loosen up hips", utilizes hand weights at home   Progress Towards Goal(s):  In  progress.   Nutritional Diagnosis:  NB-1.1 Food and nutrition-related knowledge deficit As related to limited/restrictive prior diet education.  As evidenced by pt report/diet education handout.    Intervention:  Nutrition education provided. Discussed appropriate foods/portion sizes and not recommended foods.   - Try new salad dressing, ask your daughter the type you liked - Eat carbohydrates in moderation - Look at the back of canned goods for sodium  - Incorporate more fiber in daily diet (fruits, vegetables, whole grains)  - Continue with water intake   Teaching Method Utilized: Visual Auditory  Handouts given during visit include:  "Cholesterol-Lowering Nutrition Therapy" handout from the Academy of Nutrition and Dietetics  Cholesterol and Triglycerides handout  Barriers to learning/adherence to lifestyle change: N/A  Demonstrated degree of understanding via:  Teach Back   Monitoring/Evaluation:  Dietary intake, exercise, and body weight prn.

## 2018-03-12 NOTE — Patient Instructions (Addendum)
-   Try new salad dressing, ask your daughter the type you liked - Eat carbohydrates in moderation - Look at the back of canned goods for sodium  - Incorporate more fiber in daily diet (fruits, vegetables, whole grains)  - Continue with water intake

## 2018-04-28 ENCOUNTER — Ambulatory Visit (INDEPENDENT_AMBULATORY_CARE_PROVIDER_SITE_OTHER): Payer: Medicare Other | Admitting: Internal Medicine

## 2018-04-28 ENCOUNTER — Encounter: Payer: Self-pay | Admitting: Internal Medicine

## 2018-04-28 VITALS — BP 122/78 | HR 76 | Temp 98.6°F | Wt 136.8 lb

## 2018-04-28 DIAGNOSIS — I10 Essential (primary) hypertension: Secondary | ICD-10-CM | POA: Diagnosis not present

## 2018-04-28 DIAGNOSIS — R29898 Other symptoms and signs involving the musculoskeletal system: Secondary | ICD-10-CM | POA: Diagnosis not present

## 2018-04-28 DIAGNOSIS — I639 Cerebral infarction, unspecified: Secondary | ICD-10-CM

## 2018-04-28 DIAGNOSIS — G8929 Other chronic pain: Secondary | ICD-10-CM | POA: Diagnosis not present

## 2018-04-28 DIAGNOSIS — M549 Dorsalgia, unspecified: Secondary | ICD-10-CM

## 2018-04-28 DIAGNOSIS — I6350 Cerebral infarction due to unspecified occlusion or stenosis of unspecified cerebral artery: Secondary | ICD-10-CM

## 2018-04-28 DIAGNOSIS — I63312 Cerebral infarction due to thrombosis of left middle cerebral artery: Secondary | ICD-10-CM

## 2018-04-28 LAB — CK: Total CK: 113 U/L (ref 7–177)

## 2018-04-28 LAB — COMPREHENSIVE METABOLIC PANEL
ALT: 14 U/L (ref 0–35)
AST: 18 U/L (ref 0–37)
Albumin: 4.4 g/dL (ref 3.5–5.2)
Alkaline Phosphatase: 59 U/L (ref 39–117)
BUN: 20 mg/dL (ref 6–23)
CO2: 30 mEq/L (ref 19–32)
Calcium: 9.9 mg/dL (ref 8.4–10.5)
Chloride: 105 mEq/L (ref 96–112)
Creatinine, Ser: 0.81 mg/dL (ref 0.40–1.20)
GFR: 87.73 mL/min (ref >60.00)
Glucose, Bld: 102 mg/dL — ABNORMAL HIGH (ref 70–99)
Potassium: 4.4 mEq/L (ref 3.5–5.1)
Sodium: 141 mEq/L (ref 135–145)
Total Bilirubin: 0.4 mg/dL (ref 0.2–1.2)
Total Protein: 6.7 g/dL (ref 6.0–8.3)

## 2018-04-28 LAB — CBC WITH DIFFERENTIAL/PLATELET
Basophils Absolute: 0 10*3/uL (ref 0.0–0.1)
Basophils Relative: 0.5 % (ref 0.0–3.0)
Eosinophils Absolute: 0.1 10*3/uL (ref 0.0–0.7)
Eosinophils Relative: 2.4 % (ref 0.0–5.0)
HCT: 41.8 % (ref 36.0–46.0)
Hemoglobin: 14.2 g/dL (ref 12.0–15.0)
Lymphocytes Relative: 32.3 % (ref 12.0–46.0)
Lymphs Abs: 1.9 10*3/uL (ref 0.7–4.0)
MCHC: 34 g/dL (ref 30.0–36.0)
MCV: 79.8 fl (ref 78.0–100.0)
Monocytes Absolute: 0.5 10*3/uL (ref 0.1–1.0)
Monocytes Relative: 7.9 % (ref 3.0–12.0)
Neutro Abs: 3.4 10*3/uL (ref 1.4–7.7)
Neutrophils Relative %: 56.9 % (ref 43.0–77.0)
Platelets: 226 10*3/uL (ref 150.0–400.0)
RBC: 5.23 Mil/uL — ABNORMAL HIGH (ref 3.87–5.11)
RDW: 14.6 % (ref 11.5–15.5)
WBC: 5.9 10*3/uL (ref 4.0–10.5)

## 2018-04-28 LAB — HIGH SENSITIVITY CRP: CRP, High Sensitivity: 1.5 mg/L (ref 0.000–5.000)

## 2018-04-28 LAB — SEDIMENTATION RATE: Sed Rate: 17 mm/hr (ref 0–30)

## 2018-04-28 NOTE — Patient Instructions (Signed)
Report immediately any new or worsening symptoms  Lumbar MRI as discussed  Return in 1 week for follow-up

## 2018-04-28 NOTE — Progress Notes (Signed)
Subjective:    Patient ID: Teresa Martinez, female    DOB: 09/30/1941, 77 y.o.   MRN: [id number]  HPI  77 year old patient who has a history of cerebrovascular disease.  She was hospitalized for a left pontine infarct in November 2015.  She is treated with tPA and had a very nice clinical response.  Brain MRA at that time revealed a severe right vertebral artery stenosis.  She also carries a diagnosis of cervical spinal stenosis. For the past 2 months she has had some low back discomfort.  Her chief complaint is achiness in both legs that affects her ability to walk.  She describes numbness and burning involving her lower extremities.  She describes achiness been involves the buttock area to the feet distally..  She has had a difficult time standing from a sitting position.  She describes numbness involving the upper inner thigh area that radiates to the great toes.  She also describes a burning dysesthesia as well as the numbness. She has had no falls but she describes an abnormal gait and unsteadiness.  Denies any bowel or bladder difficulties  Past Medical History:  Diagnosis Date  . Cervical spine degeneration    C5/C6  . Chronic right shoulder pain   . Hemiparesis affecting dominant side as late effect of cerebrovascular accident (HCC) 12/07/2014  . Hyperlipemia   . Hypertension   . Menopausal syndrome    Teresa Pen(Sherry BenedictDickstein)  . Overactive bladder   . Statin intolerance      Social History   Socioeconomic History  . Marital status: Married    Spouse name: Not on file  . Number of children: 4  . Years of education: Not on file  . Highest education level: Not on file  Occupational History  . Occupation: retired  Engineer, productionocial Needs  . Financial resource strain: Not on file  . Food insecurity:    Worry: Not on file    Inability: Not on file  . Transportation needs:    Medical: Not on file    Non-medical: Not on file  Tobacco Use  . Smoking status: Never Smoker  .  Smokeless tobacco: Never Used  Substance and Sexual Activity  . Alcohol use: No  . Drug use: No  . Sexual activity: Not on file  Lifestyle  . Physical activity:    Days per week: Not on file    Minutes per session: Not on file  . Stress: Not on file  Relationships  . Social connections:    Talks on phone: Not on file    Gets together: Not on file    Attends religious service: Not on file    Active member of club or organization: Not on file    Attends meetings of clubs or organizations: Not on file    Relationship status: Not on file  . Intimate partner violence:    Fear of current or ex partner: Not on file    Emotionally abused: Not on file    Physically abused: Not on file    Forced sexual activity: Not on file  Other Topics Concern  . Not on file  Social History Narrative   Regular exercise, 4 children, 6 grandchildren, one stepchild and two additional stepgrandchildren   Patient is right handed.   Patient doesn't drink caffeine.    Past Surgical History:  Procedure Laterality Date  . ABDOMINAL HYSTERECTOMY     1985  . BAND HEMORRHOIDECTOMY     2010  . CHOLECYSTECTOMY  1950  . PAROTIDECTOMY     30 years ago    Family History  Problem Relation Age of Onset  . Heart failure Father   . Diabetes Mother   . Hypertension Mother   . Stroke Mother   . Cirrhosis Sister   . Diabetes Sister   . Hyperlipidemia Brother   . Hypertension Brother   . Stroke Brother   . Diabetes Brother     Allergies  Allergen Reactions  . Statins Other (See Comments)    Muscle pain and unable to walk  . Latex Other (See Comments)    Burns her skin    Current Outpatient Medications on File Prior to Visit  Medication Sig Dispense Refill  . aspirin EC 325 MG EC tablet Take 1 tablet (325 mg total) by mouth daily. 100 tablet 1  . ESTRACE VAGINAL 0.1 MG/GM vaginal cream INSERT 1 GRAM VAGINALLY TWO TIMES A WEEK  3  . ezetimibe (ZETIA) 10 MG tablet Take 1 tablet (10 mg total) by  mouth daily. 90 tablet 5  . fesoterodine (TOVIAZ) 4 MG TB24 tablet Take 1 tablet (4 mg total) by mouth daily. 30 tablet 5  . lisinopril (PRINIVIL,ZESTRIL) 20 MG tablet TAKE 1 TABLET(20 MG) BY MOUTH DAILY 90 tablet 3  . VOLTAREN 1 % GEL APPLY 2 GRAMS TOPICALLY FOUR TIMES DAILY 300 g 0   No current facility-administered medications on file prior to visit.     BP 122/78 (BP Location: Left Arm, Patient Position: Sitting, Cuff Size: Small)   Pulse 76   Temp 98.6 F (37 C) (Oral)   Wt 136 lb 12.8 oz (62.1 kg)   SpO2 97%   BMI 22.08 kg/m     Review of Systems  Constitutional: Negative.   HENT: Negative for congestion, dental problem, hearing loss, rhinorrhea, sinus pressure, sore throat and tinnitus.   Eyes: Negative for pain, discharge and visual disturbance.  Respiratory: Negative for cough and shortness of breath.   Cardiovascular: Negative for chest pain, palpitations and leg swelling.  Gastrointestinal: Negative for abdominal distention, abdominal pain, blood in stool, constipation, diarrhea, nausea and vomiting.  Genitourinary: Negative for difficulty urinating, dysuria, flank pain, frequency, hematuria, pelvic pain, urgency, vaginal bleeding, vaginal discharge and vaginal pain.  Musculoskeletal: Positive for gait problem. Negative for arthralgias and joint swelling.  Skin: Negative for rash.  Neurological: Positive for weakness and numbness. Negative for dizziness, syncope, speech difficulty and headaches.  Hematological: Negative for adenopathy.  Psychiatric/Behavioral: Negative for agitation, behavioral problems and dysphoric mood. The patient is not nervous/anxious.        Objective:   Physical Exam  Constitutional: She is oriented to person, place, and time. She appears well-developed and well-nourished.  HENT:  Head: Normocephalic.  Right Ear: External ear normal.  Left Ear: External ear normal.  Mouth/Throat: Oropharynx is clear and moist.  Eyes: Pupils are equal,  round, and reactive to light. Conjunctivae and EOM are normal.  Neck: Normal range of motion. Neck supple. No thyromegaly present.  Cardiovascular: Normal rate, regular rhythm, normal heart sounds and intact distal pulses.  Pulmonary/Chest: Effort normal and breath sounds normal.  Abdominal: Soft. Bowel sounds are normal. She exhibits no mass. There is no tenderness.  Musculoskeletal: Normal range of motion.  Lymphadenopathy:    She has no cervical adenopathy.  Neurological: She is alert and oriented to person, place, and time. No cranial nerve deficit.  No drift of the outstretched arms Normal finger-to-nose testing Normal heel-to-shin testing Gait is slow and  unsteady but not grossly ataxic  normal sensory exam -does complain of numbness of both great toes Right sided hyperreflexia Right Babinski response; left equivocal  Able to raise and lower extended legs to 60 degrees 5 consecutive times bilaterally   Skin: Skin is warm and dry. No rash noted.  Psychiatric: She has a normal mood and affect. Her behavior is normal.          Assessment & Plan:   History of low back pain with unsteady gait and lower extremity paresthesias of 2 months duration. Status post left pontine infarct History of cervical spinal stenosis History of severe right vertebral artery stenosis  We will check a lumbar MRI Check updated lab including muscle enzymes and sed rate Follow-up 1 week Patient will report any new or worsening symptoms Likely will need neurology referral  Teresa Martinez

## 2018-05-04 ENCOUNTER — Telehealth: Payer: Self-pay | Admitting: *Deleted

## 2018-05-04 NOTE — Telephone Encounter (Signed)
Please advise 

## 2018-05-04 NOTE — Telephone Encounter (Signed)
Copied from CRM [phone]. Topic: Quick Communication - Other Results >> May 04, 2018 10:27 AM Gaynelle AduPoole, Shalonda wrote: Reason for CRM:  Pt has a schedule appt for a MRI on 05/09/18, she stated she has anxiety and requesting  a prescription for something to Relax her.   She is unsure of what to take.   [facility] [phone] - [place], [place] - [phone] [facility] [address]

## 2018-05-05 NOTE — Telephone Encounter (Signed)
Please advise 

## 2018-05-05 NOTE — Telephone Encounter (Signed)
Diazepam 2 mg number 6 tablets  1 tablet 30 minutes prior to MRI

## 2018-05-06 MED ORDER — DIAZEPAM 2 MG PO TABS: 2.0000 mg | ORAL_TABLET | Freq: Four times a day (QID) | ORAL | 0 refills | Status: DC | PRN

## 2018-05-06 NOTE — Telephone Encounter (Signed)
Spoke to pt husband and gave him the message to update the pt on Rx. Husband verbalized understanding. No further action.

## 2018-05-06 NOTE — Telephone Encounter (Signed)
Medication sent in. Will notify pt of update.

## 2018-05-07 NOTE — Telephone Encounter (Signed)
Please resend diazepam (VALIUM) 2 MG tablet as the pharmacy did not receive it yesterday.

## 2018-05-07 NOTE — Telephone Encounter (Signed)
Patient notified and verbalized understanding. 

## 2018-05-09 ENCOUNTER — Ambulatory Visit
Admission: RE | Admit: 2018-05-09 | Discharge: 2018-05-09 | Disposition: A | Discharge status: Home / Self Care | Payer: Medicare Other | Source: Ambulatory Visit | Attending: Internal Medicine | Admitting: Internal Medicine

## 2018-05-09 DIAGNOSIS — M549 Dorsalgia, unspecified: Secondary | ICD-10-CM

## 2018-05-09 DIAGNOSIS — G8929 Other chronic pain: Secondary | ICD-10-CM

## 2018-05-09 DIAGNOSIS — R29898 Other symptoms and signs involving the musculoskeletal system: Secondary | ICD-10-CM

## 2018-05-09 DIAGNOSIS — I10 Essential (primary) hypertension: Secondary | ICD-10-CM

## 2018-05-12 ENCOUNTER — Ambulatory Visit (INDEPENDENT_AMBULATORY_CARE_PROVIDER_SITE_OTHER): Payer: Medicare Other | Admitting: Internal Medicine

## 2018-05-12 ENCOUNTER — Encounter: Payer: Self-pay | Admitting: Internal Medicine

## 2018-05-12 VITALS — BP 144/68 | Temp 98.1°F | Wt 136.0 lb

## 2018-05-12 DIAGNOSIS — M48061 Spinal stenosis, lumbar region without neurogenic claudication: Secondary | ICD-10-CM

## 2018-05-12 DIAGNOSIS — M48062 Spinal stenosis, lumbar region with neurogenic claudication: Secondary | ICD-10-CM | POA: Diagnosis not present

## 2018-05-12 DIAGNOSIS — G9519 Other vascular myelopathies: Secondary | ICD-10-CM

## 2018-05-12 NOTE — Patient Instructions (Addendum)
Neurosurgical consultation as discussed   Spinal Stenosis Spinal stenosis occurs when the open space (spinal canal) between the bones of your spine (vertebrae) narrows, putting pressure on the spinal cord or nerves. What are the causes? This condition is caused by areas of bone pushing into the central canals of your vertebrae. This condition may be present at birth (congenital), or it may be caused by:  Arthritic deterioration of your vertebrae (spinal degeneration). This usually starts around age 51.  Injury or trauma to the spine.  Tumors in the spine.  Calcium deposits in the spine.  What are the signs or symptoms? Symptoms of this condition include:  Pain in the neck or back that is generally worse with activities, particularly when standing and walking.  Numbness, tingling, hot or cold sensations, weakness, or weariness in your legs.  Pain going up and down the leg (sciatica).  Frequent episodes of falling.  A foot-slapping gait that leads to muscle weakness.  In more serious cases, you may develop:  Problemspassing stool or passing urine.  Difficulty having sex.  Loss of feeling in part or all of your leg.  Symptoms may come on slowly and get worse over time. How is this diagnosed? This condition is diagnosed based on your medical history and a physical exam. Tests will also be done, such as:  MRI.  CT scan.  X-ray.  How is this treated? Treatment for this condition often focuses on managing your pain and any other symptoms. Treatment may include:  Practicing good posture to lessen pressure on your nerves.  Exercising to strengthen muscles, build endurance, improve balance, and maintain good joint movement (range of motion).  Losing weight, if needed.  Taking medicines to reduce swelling, inflammation, or pain.  Assistive devices, such as a corset or brace.  In some cases, surgery may be needed. The most common procedure is decompression  laminectomy. This is done to remove excess bone that puts pressure on your nerve roots. Follow these instructions at home: Managing pain, stiffness, and swelling  Do all exercises and stretches as told by your health care provider.  Practice good posture. If you were given a brace or a corset, wear it as told by your health care provider.  Do not do any activities that cause pain. Ask your health care provider what activities are safe for you.  Do not lift anything that is heavier than 10 lb (4.5 kg) or the limit that your health care provider tells you.  Maintain a healthy weight. Talk with your health care provider if you need help losing weight.  If directed, apply heat to the affected area as often as told by your health care provider. Use the heat source that your health care provider recommends, such as a moist heat pack or a heating pad. ? Place a towel between your skin and the heat source. ? Leave the heat on for 20-30 minutes. ? Remove the heat if your skin turns bright red. This is especially important if you are not able to feel pain, heat, or cold. You may have a greater risk of getting burned. General instructions  Take over-the-counter and prescription medicines only as told by your health care provider.  Do not use any products that contain nicotine or tobacco, such as cigarettes and e-cigarettes. If you need help quitting, ask your health care provider.  Eat a healthy diet. This includes plenty of fruits and vegetables, whole grains, and low-fat (lean) protein.  Keep all follow-up visits as  told by your health care provider. This is important. Contact a health care provider if:  Your symptoms do not get better or they get worse.  You have a fever. Get help right away if:  You have new or worse pain in your neck or upper back.  You have severe pain that cannot be controlled with medicines.  You are dizzy.  You have vision problems, blurred vision, or double  vision.  You have a severe headache that is worse when you stand.  You have nausea or you vomit.  You develop new or worse numbness or tingling in your back or legs.  You have pain, redness, swelling, or warmth in your arm or leg. Summary  Spinal stenosis occurs when the open space (spinal canal) between the bones of your spine (vertebrae) narrows. This narrowing puts pressure on the spinal cord or nerves.  Spinal stenosis can cause numbness, weakness, or pain in the neck, back, and legs.  This condition may be caused by a birth defect, arthritic deterioration of your vertebrae, injury, tumors, or calcium deposits.  This condition is usually diagnosed with MRIs, CT scans, and X-rays. This information is not intended to replace advice given to you by your health care provider. Make sure you discuss any questions you have with your health care provider. Document Released: 01/25/2004 Document Revised: 10/09/2016 Document Reviewed: 10/09/2016 [employer] Interactive Patient Education  [employer]2018 [employer]

## 2018-05-12 NOTE — Progress Notes (Signed)
Subjective:    Patient ID: Teresa Martinez, female    DOB: 06/07/1941, 77 y.o.   MRN: [id number]  HPI  77 year old patient who is seen today for follow-up of lumbar spinal stenosis with neurogenic claudication. The patient had a recent lumbar MRI that confirmed a severe spinal stenosis.  She remains significantly symptomatic She has a history of hypertension and cerebrovascular disease.  Past Medical History:  Diagnosis Date  . Cervical spine degeneration    C5/C6  . Chronic right shoulder pain   . Hemiparesis affecting dominant side as late effect of cerebrovascular accident (HCC) 12/07/2014  . Hyperlipemia   . Hypertension   . Menopausal syndrome    Teresa Pen([place])  . Overactive bladder   . Statin intolerance      Social History   Socioeconomic History  . Marital status: Married    Spouse name: Not on file  . Number of children: 4  . Years of education: Not on file  . Highest education level: Not on file  Occupational History  . Occupation: retired  Engineer, productionocial Needs  . Financial resource strain: Not on file  . Food insecurity:    Worry: Not on file    Inability: Not on file  . Transportation needs:    Medical: Not on file    Non-medical: Not on file  Tobacco Use  . Smoking status: Never Smoker  . Smokeless tobacco: Never Used  Substance and Sexual Activity  . Alcohol use: No  . Drug use: No  . Sexual activity: Not on file  Lifestyle  . Physical activity:    Days per week: Not on file    Minutes per session: Not on file  . Stress: Not on file  Relationships  . Social connections:    Talks on phone: Not on file    Gets together: Not on file    Attends religious service: Not on file    Active member of club or organization: Not on file    Attends meetings of clubs or organizations: Not on file    Relationship status: Not on file  . Intimate partner violence:    Fear of current or ex partner: Not on file    Emotionally abused: Not on file   Physically abused: Not on file    Forced sexual activity: Not on file  Other Topics Concern  . Not on file  Social History Narrative   Regular exercise, 4 children, 6 grandchildren, one stepchild and two additional stepgrandchildren   Patient is right handed.   Patient doesn't drink caffeine.    Past Surgical History:  Procedure Laterality Date  . ABDOMINAL HYSTERECTOMY     1985  . BAND HEMORRHOIDECTOMY     2010  . CHOLECYSTECTOMY     1950  . PAROTIDECTOMY     30 years ago    Family History  Problem Relation Age of Onset  . Heart failure Father   . Diabetes Mother   . Hypertension Mother   . Stroke Mother   . Cirrhosis Sister   . Diabetes Sister   . Hyperlipidemia Brother   . Hypertension Brother   . Stroke Brother   . Diabetes Brother     Allergies  Allergen Reactions  . Statins Other (See Comments)    Muscle pain and unable to walk  . Latex Other (See Comments)    Burns her skin    Current Outpatient Medications on File Prior to Visit  Medication Sig Dispense Refill  .  aspirin EC 325 MG EC tablet Take 1 tablet (325 mg total) by mouth daily. 100 tablet 1  . diazepam (VALIUM) 2 MG tablet Take 1 tablet (2 mg total) by mouth every 6 (six) hours as needed for anxiety. 6 tablet 0  . ESTRACE VAGINAL 0.1 MG/GM vaginal cream INSERT 1 GRAM VAGINALLY TWO TIMES A WEEK  3  . ezetimibe (ZETIA) 10 MG tablet Take 1 tablet (10 mg total) by mouth daily. 90 tablet 5  . fesoterodine (TOVIAZ) 4 MG TB24 tablet Take 1 tablet (4 mg total) by mouth daily. 30 tablet 5  . lisinopril (PRINIVIL,ZESTRIL) 20 MG tablet TAKE 1 TABLET(20 MG) BY MOUTH DAILY 90 tablet 3  . VOLTAREN 1 % GEL APPLY 2 GRAMS TOPICALLY FOUR TIMES DAILY 300 g 0   No current facility-administered medications on file prior to visit.     BP (!) 144/68 (BP Location: Right Arm, Patient Position: Sitting, Cuff Size: Normal)   Temp 98.1 F (36.7 C) (Oral)   Wt 136 lb (61.7 kg)   BMI 21.95 kg/m     Review of Systems   Constitutional: Negative.   HENT: Negative for congestion, dental problem, hearing loss, rhinorrhea, sinus pressure, sore throat and tinnitus.   Eyes: Negative for pain, discharge and visual disturbance.  Respiratory: Negative for cough and shortness of breath.   Cardiovascular: Negative for chest pain, palpitations and leg swelling.  Gastrointestinal: Negative for abdominal distention, abdominal pain, blood in stool, constipation, diarrhea, nausea and vomiting.  Genitourinary: Negative for difficulty urinating, dysuria, flank pain, frequency, hematuria, pelvic pain, urgency, vaginal bleeding, vaginal discharge and vaginal pain.  Musculoskeletal: Positive for back pain. Negative for arthralgias, gait problem and joint swelling.  Skin: Negative for rash.  Neurological: Negative for dizziness, syncope, speech difficulty, weakness, numbness and headaches.  Hematological: Negative for adenopathy.  Psychiatric/Behavioral: Negative for agitation, behavioral problems and dysphoric mood. The patient is not nervous/anxious.        Objective:   Physical Exam  Constitutional: She appears well-developed and well-nourished. No distress.  Comfortable sitting at rest          Assessment & Plan:  Severe lumbar spinal stenosis with neurogenic claudication. Patient has been referred for neurosurgical evaluation History of cerebrovascular disease Hypertension stable  Gordy Savers

## 2018-05-14 ENCOUNTER — Other Ambulatory Visit: Payer: Medicare Other

## 2018-07-09 DIAGNOSIS — Z01818 Encounter for other preprocedural examination: Secondary | ICD-10-CM | POA: Insufficient documentation

## 2018-07-16 ENCOUNTER — Encounter: Payer: Self-pay | Admitting: Internal Medicine

## 2018-07-16 ENCOUNTER — Ambulatory Visit (INDEPENDENT_AMBULATORY_CARE_PROVIDER_SITE_OTHER): Payer: Medicare Other | Admitting: Internal Medicine

## 2018-07-16 VITALS — BP 104/70 | HR 64 | Temp 98.3°F | Wt 137.0 lb

## 2018-07-16 DIAGNOSIS — I63312 Cerebral infarction due to thrombosis of left middle cerebral artery: Secondary | ICD-10-CM | POA: Diagnosis not present

## 2018-07-16 DIAGNOSIS — I10 Essential (primary) hypertension: Secondary | ICD-10-CM | POA: Diagnosis not present

## 2018-07-16 DIAGNOSIS — E7800 Pure hypercholesterolemia, unspecified: Secondary | ICD-10-CM | POA: Diagnosis not present

## 2018-07-16 DIAGNOSIS — Z0000 Encounter for general adult medical examination without abnormal findings: Secondary | ICD-10-CM | POA: Diagnosis not present

## 2018-07-16 LAB — LIPID PANEL
Cholesterol: 248 mg/dL — ABNORMAL HIGH (ref 0–200)
HDL: 52.9 mg/dL (ref >39.00)
LDL Cholesterol: 176 mg/dL — ABNORMAL HIGH (ref 0–99)
NonHDL: 194.84
Total CHOL/HDL Ratio: 5
Triglycerides: 93 mg/dL (ref 0.0–149.0)
VLDL: 18.6 mg/dL (ref 0.0–40.0)

## 2018-07-16 NOTE — Patient Instructions (Addendum)
Limit your sodium (Salt) intake   Cholesterol Cholesterol is a white, waxy, fat-like substance that is needed by the human body in small amounts. The liver makes all the cholesterol we need. Cholesterol is carried from the liver by the blood through the blood vessels. Deposits of cholesterol (plaques) may build up on blood vessel (artery) walls. Plaques make the arteries narrower and stiffer. Cholesterol plaques increase the risk for heart attack and stroke. You cannot feel your cholesterol level even if it is very high. The only way to know that it is high is to have a blood test. Once you know your cholesterol levels, you should keep a record of the test results. Work with your health care provider to keep your levels in the desired range. What do the results mean?  Total cholesterol is a rough measure of all the cholesterol in your blood.  LDL (low-density lipoprotein) is the "bad" cholesterol. This is the type that causes plaque to build up on the artery walls. You want this level to be low.  HDL (high-density lipoprotein) is the "good" cholesterol because it cleans the arteries and carries the LDL away. You want this level to be high.  Triglycerides are fat that the body can either burn for energy or store. High levels are closely linked to heart disease. What are the desired levels of cholesterol?  Total cholesterol below 200.  LDL below 100 for people who are at risk, below 70 for people at very high risk.  HDL above 40 is good. A level of 60 or higher is considered to be protective against heart disease.  Triglycerides below 150. How can I lower my cholesterol? Diet Follow your diet program as told by your health care provider.  Choose fish or white meat chicken and [place], roasted or baked. Limit fatty cuts of red meat, fried foods, and processed meats, such as sausage and lunch meats.  Eat lots of fresh fruits and vegetables.  Choose whole grains, beans, pasta, potatoes, and  cereals.  Choose olive oil, corn oil, or canola oil, and use only small amounts.  Avoid butter, mayonnaise, shortening, or palm kernel oils.  Avoid foods with trans fats.  Drink skim or nonfat milk and eat low-fat or nonfat yogurt and cheeses. Avoid whole milk, cream, ice cream, egg yolks, and full-fat cheeses.  Healthier desserts include angel food cake, ginger snaps, animal crackers, hard candy, popsicles, and low-fat or nonfat frozen yogurt. Avoid pastries, cakes, pies, and cookies.  Exercise  Follow your exercise program as told by your health care provider. A regular program: ? Helps to decrease LDL and raise HDL. ? Helps with weight control.  Do things that increase your activity level, such as gardening, walking, and taking the stairs.  Ask your health care provider about ways that you can be more active in your daily life.  Medicine  Take over-the-counter and prescription medicines only as told by your health care provider. ? Medicine may be prescribed by your health care provider to help lower cholesterol and decrease the risk for heart disease. This is usually done if diet and exercise have failed to bring down cholesterol levels. ? If you have several risk factors, you may need medicine even if your levels are normal.  .Limit your sodium (Salt) intake  Please check your blood pressure on a regular basis.  If it is consistently greater than 150/90, please make an office appointment.

## 2018-07-16 NOTE — Progress Notes (Signed)
Subjective:    Patient ID: Teresa Martinez, female    DOB: August 01, 1941, 77 y.o.   MRN: [id number]  HPI  77 year old patient who is seen today for an annual wellness exam as well as a subsequent Medicare wellness visit She has a history of severe spinal stenosis and is scheduled for elective surgery at the spine center at [facility] on September 16.  Her back pain is improved.  She does have a history of hypercholesterolemia cerebrovascular disease and statin intolerance.  Presently is on Zetia 10 mg daily.  She has essential hypertension which has been stable.  She had a left pontine stroke in December 2015.  Past Medical History:  Diagnosis Date  . Cervical spine degeneration    C5/C6  . Chronic right shoulder pain   . Hemiparesis affecting dominant side as late effect of cerebrovascular accident (HCC) 12/07/2014  . Hyperlipemia   . Hypertension   . Menopausal syndrome    [name] [place])  . Overactive bladder   . Statin intolerance      Social History   Socioeconomic History  . Marital status: Married    Spouse name: Not on file  . Number of children: 4  . Years of education: Not on file  . Highest education level: Not on file  Occupational History  . Occupation: retired  Engineer, production  . Financial resource strain: Not on file  . Food insecurity:    Worry: Not on file    Inability: Not on file  . Transportation needs:    Medical: Not on file    Non-medical: Not on file  Tobacco Use  . Smoking status: Never Smoker  . Smokeless tobacco: Never Used  Substance and Sexual Activity  . Alcohol use: No  . Drug use: No  . Sexual activity: Not on file  Lifestyle  . Physical activity:    Days per week: Not on file    Minutes per session: Not on file  . Stress: Not on file  Relationships  . Social connections:    Talks on phone: Not on file    Gets together: Not on file    Attends religious service: Not on file    Active member of club or organization:  Not on file    Attends meetings of clubs or organizations: Not on file    Relationship status: Not on file  . Intimate partner violence:    Fear of current or ex partner: Not on file    Emotionally abused: Not on file    Physically abused: Not on file    Forced sexual activity: Not on file  Other Topics Concern  . Not on file  Social History Narrative   Regular exercise, 4 children, 6 grandchildren, one stepchild and two additional stepgrandchildren   Patient is right handed.   Patient doesn't drink caffeine.    Past Surgical History:  Procedure Laterality Date  . ABDOMINAL HYSTERECTOMY     1985  . BAND HEMORRHOIDECTOMY     2010  . CHOLECYSTECTOMY     1950  . PAROTIDECTOMY     30 years ago    Family History  Problem Relation Age of Onset  . Heart failure Father   . Diabetes Mother   . Hypertension Mother   . Stroke Mother   . Cirrhosis Sister   . Diabetes Sister   . Hyperlipidemia Brother   . Hypertension Brother   . Stroke Brother   . Diabetes Brother  Allergies  Allergen Reactions  . Statins Other (See Comments)    Muscle pain and unable to walk Muscle pain and unable to walk  . Latex Other (See Comments)    Burns her skin    Current Outpatient Medications on File Prior to Visit  Medication Sig Dispense Refill  . aspirin EC 325 MG EC tablet Take 1 tablet (325 mg total) by mouth daily. 100 tablet 1  . conjugated estrogens (PREMARIN) vaginal cream Place 0.5 g vaginally.    . diazepam (VALIUM) 2 MG tablet Take 1 tablet (2 mg total) by mouth every 6 (six) hours as needed for anxiety. 6 tablet 0  . ezetimibe (ZETIA) 10 MG tablet Take 1 tablet (10 mg total) by mouth daily. 90 tablet 5  . fesoterodine (TOVIAZ) 4 MG TB24 tablet Take 1 tablet (4 mg total) by mouth daily. 30 tablet 5  . lisinopril (PRINIVIL,ZESTRIL) 20 MG tablet TAKE 1 TABLET(20 MG) BY MOUTH DAILY 90 tablet 3  . VOLTAREN 1 % GEL APPLY 2 GRAMS TOPICALLY FOUR TIMES DAILY 300 g 0   No current  facility-administered medications on file prior to visit.     BP 104/70 (BP Location: Right Arm, Patient Position: Sitting, Cuff Size: Normal)   Pulse 64   Temp 98.3 F (36.8 C) (Oral)   Wt 137 lb (62.1 kg)   SpO2 98%   BMI 22.11 kg/m   Subsequent Medicare wellness visit  1. Risk factors, based on past  M,S,F history.  Patient has a history of hypertension and hypercholesterolemia as well as cerebrovascular disease and prior stroke  2.  Physical activities: Limited more recently due to severe spinal stenosis and neurogenic claudication he is a participates in water aerobics 3 times per week  3.  Depression/mood: No history major depression or mood disorder  4.  Hearing: No deficits  5.  ADL's: Independent  6.  Fall risk: Increased slightly due to spinal stenosis  7.  Home safety: No issues identified  8.  Height weight, and visual acuity; height and weight stable no change in visual acuity  9.  Counseling: Continue heart healthy diet and active lifestyle  10. Lab orders based on risk factors: We will review a follow-up lipid profile  11. Referral : Follow-up neurosurgery  12. Care plan: Continue efforts at aggressive risk factor modification  13. Cognitive assessment: Alert and oriented with normal affect.  No cognitive dysfunction  14. Screening: Patient provided with a written and personalized 5-10 year screening schedule in the AVS.    15. Provider List Update: Neurosurgery primary care and ophthalmology.  GYN and dental medicine    Review of Systems  Constitutional: Negative.   HENT: Negative for congestion, dental problem, hearing loss, rhinorrhea, sinus pressure, sore throat and tinnitus.   Eyes: Negative for pain, discharge and visual disturbance.  Respiratory: Negative for cough and shortness of breath.   Cardiovascular: Negative for chest pain, palpitations and leg swelling.  Gastrointestinal: Negative for abdominal distention, abdominal pain, blood in  stool, constipation, diarrhea, nausea and vomiting.  Genitourinary: Negative for difficulty urinating, dysuria, flank pain, frequency, hematuria, pelvic pain, urgency, vaginal bleeding, vaginal discharge and vaginal pain.  Musculoskeletal: Positive for arthralgias and back pain. Negative for gait problem and joint swelling.  Skin: Negative for rash.  Neurological: Negative for dizziness, syncope, speech difficulty, weakness, numbness and headaches.  Hematological: Negative for adenopathy.  Psychiatric/Behavioral: Negative for agitation, behavioral problems and dysphoric mood. The patient is not nervous/anxious.  Objective:   Physical Exam  Constitutional: She is oriented to person, place, and time. She appears well-developed and well-nourished.  Blood pressure low normal and symmetrical  HENT:  Head: Normocephalic and atraumatic.  Right Ear: External ear normal.  Left Ear: External ear normal.  Mouth/Throat: Oropharynx is clear and moist.  Eyes: Conjunctivae and EOM are normal.  Neck: Normal range of motion. Neck supple. No JVD present. No thyromegaly present.  Cardiovascular: Normal rate, regular rhythm, normal heart sounds and intact distal pulses.  No murmur heard. Decreased left dorsalis pedis pulse  Pulmonary/Chest: Effort normal and breath sounds normal. She has no wheezes. She has no rales.  Abdominal: Soft. Bowel sounds are normal. She exhibits no distension and no mass. There is no tenderness. There is no rebound and no guarding.  Genitourinary: Vagina normal.  Musculoskeletal: Normal range of motion. She exhibits no edema or tenderness.  Neurological: She is alert and oriented to person, place, and time. She has normal reflexes. She displays normal reflexes. No cranial nerve deficit. She exhibits normal muscle tone. Coordination normal.  Skin: Skin is warm and dry. No rash noted.  Psychiatric: She has a normal mood and affect. Her behavior is normal.            Assessment & Plan:   Preventive health examination Subsequent Medicare wellness visit Essential hypertension well-controlled Hypercholesterolemia.  Continue Zetia.  Will review a lipid profile Spinal stenosis.  Elective surgery scheduled for next month History of cerebrovascular disease.  Will discontinue aspirin 7 days preop  Follow-up 6 months  Gordy Savers

## 2018-08-03 HISTORY — PX: LUMBAR LAMINECTOMY: SHX95

## 2018-08-04 MED ORDER — GENERIC EXTERNAL MEDICATION
1.00 | Status: DC
Start: ? — End: 2018-08-04

## 2018-08-04 MED ORDER — CALCIUM CITRATE 200 MG PO TABS
950.00 | ORAL_TABLET | ORAL | Status: DC
Start: 2018-08-05 — End: 2018-08-04

## 2018-08-04 MED ORDER — ONDANSETRON 4 MG PO TBDP
4.00 | ORAL_TABLET | ORAL | Status: DC
Start: ? — End: 2018-08-04

## 2018-08-04 MED ORDER — GENERIC EXTERNAL MEDICATION
1.00 | Status: DC
Start: 2018-08-05 — End: 2018-08-04

## 2018-08-04 MED ORDER — OXYCODONE HCL 5 MG PO TABS
5.00 | ORAL_TABLET | ORAL | Status: DC
Start: ? — End: 2018-08-04

## 2018-08-04 MED ORDER — CHOLECALCIFEROL 25 MCG (1000 UT) PO TABS
1000.00 | ORAL_TABLET | ORAL | Status: DC
Start: 2018-08-05 — End: 2018-08-04

## 2018-08-04 MED ORDER — ALUMINUM-MAGNESIUM-SIMETHICONE 200-200-20 MG/5ML PO SUSP
30.00 | ORAL | Status: DC
Start: ? — End: 2018-08-04

## 2018-08-04 MED ORDER — SENNOSIDES-DOCUSATE SODIUM 8.6-50 MG PO TABS
2.00 | ORAL_TABLET | ORAL | Status: DC
Start: 2018-08-04 — End: 2018-08-04

## 2018-08-04 MED ORDER — VITAMIN C 500 MG PO TABS
500.00 | ORAL_TABLET | ORAL | Status: DC
Start: 2018-08-05 — End: 2018-08-04

## 2018-08-04 MED ORDER — BISACODYL 10 MG RE SUPP
10.00 | RECTAL | Status: DC
Start: ? — End: 2018-08-04

## 2018-08-04 MED ORDER — POLYSACCHARIDE IRON COMPLEX 150 MG PO CAPS
150.00 | ORAL_CAPSULE | ORAL | Status: DC
Start: 2018-08-05 — End: 2018-08-04

## 2018-08-04 MED ORDER — CALCIUM CARBONATE ANTACID 750 MG PO CHEW
750.00 | CHEWABLE_TABLET | ORAL | Status: DC
Start: 2018-08-05 — End: 2018-08-04

## 2018-08-04 MED ORDER — ACETAMINOPHEN 500 MG PO TABS
1000.00 | ORAL_TABLET | ORAL | Status: DC
Start: 2018-08-05 — End: 2018-08-04

## 2018-08-05 ENCOUNTER — Encounter: Payer: Self-pay | Admitting: Internal Medicine

## 2018-08-05 ENCOUNTER — Ambulatory Visit (INDEPENDENT_AMBULATORY_CARE_PROVIDER_SITE_OTHER): Payer: Medicare Other | Admitting: Internal Medicine

## 2018-08-05 VITALS — BP 110/70 | HR 88 | Temp 98.9°F | Wt 136.0 lb

## 2018-08-05 DIAGNOSIS — R55 Syncope and collapse: Secondary | ICD-10-CM

## 2018-08-05 DIAGNOSIS — I10 Essential (primary) hypertension: Secondary | ICD-10-CM | POA: Diagnosis not present

## 2018-08-05 DIAGNOSIS — E7800 Pure hypercholesterolemia, unspecified: Secondary | ICD-10-CM | POA: Diagnosis not present

## 2018-08-05 DIAGNOSIS — M48061 Spinal stenosis, lumbar region without neurogenic claudication: Secondary | ICD-10-CM | POA: Diagnosis not present

## 2018-08-05 LAB — CBC WITH DIFFERENTIAL/PLATELET
Basophils Absolute: 0 10*3/uL (ref 0.0–0.1)
Basophils Relative: 0.1 % (ref 0.0–3.0)
Eosinophils Absolute: 0 10*3/uL (ref 0.0–0.7)
Eosinophils Relative: 0.2 % (ref 0.0–5.0)
HCT: 38.4 % (ref 36.0–46.0)
Hemoglobin: 12.6 g/dL (ref 12.0–15.0)
Lymphocytes Relative: 12.1 % (ref 12.0–46.0)
Lymphs Abs: 1.4 10*3/uL (ref 0.7–4.0)
MCHC: 32.8 g/dL (ref 30.0–36.0)
MCV: 80.4 fl (ref 78.0–100.0)
Monocytes Absolute: 1 10*3/uL (ref 0.1–1.0)
Monocytes Relative: 8.4 % (ref 3.0–12.0)
Neutro Abs: 9 10*3/uL — ABNORMAL HIGH (ref 1.4–7.7)
Neutrophils Relative %: 79.2 % — ABNORMAL HIGH (ref 43.0–77.0)
Platelets: 202 10*3/uL (ref 150.0–400.0)
RBC: 4.78 Mil/uL (ref 3.87–5.11)
RDW: 14 % (ref 11.5–15.5)
WBC: 11.4 10*3/uL — ABNORMAL HIGH (ref 4.0–10.5)

## 2018-08-05 LAB — COMPREHENSIVE METABOLIC PANEL
ALT: 12 U/L (ref 0–35)
AST: 20 U/L (ref 0–37)
Albumin: 4 g/dL (ref 3.5–5.2)
Alkaline Phosphatase: 61 U/L (ref 39–117)
BUN: 13 mg/dL (ref 6–23)
CO2: 30 mEq/L (ref 19–32)
Calcium: 9.3 mg/dL (ref 8.4–10.5)
Chloride: 101 mEq/L (ref 96–112)
Creatinine, Ser: 0.77 mg/dL (ref 0.40–1.20)
GFR: 92.95 mL/min (ref >60.00)
Glucose, Bld: 125 mg/dL — ABNORMAL HIGH (ref 70–99)
Potassium: 4.2 mEq/L (ref 3.5–5.1)
Sodium: 138 mEq/L (ref 135–145)
Total Bilirubin: 0.4 mg/dL (ref 0.2–1.2)
Total Protein: 6.5 g/dL (ref 6.0–8.3)

## 2018-08-05 NOTE — Progress Notes (Signed)
Subjective:    Patient ID: Teresa Martinez, female    DOB: 1941-08-16, 77 y.o.   MRN: [id number]  HPI 77 year old patient who is status post laminectomy for severe lumbar spinal stenosis 2 days ago.  She was discharged from the hospital yesterday she does have a history of essential hypertension which has been managed with lisinopril 20 mg daily. Earlier today after ambulation with a walker she became quite weak and had a near syncopal episode.  Her head drooped and she was momentarily unresponsive but quickly aroused.  Associated symptoms included lightheadedness prior to the event.  Blood pressure has been in a low normal range.  Past Medical History:  Diagnosis Date  . Cervical spine degeneration    C5/C6  . Chronic right shoulder pain   . Hemiparesis affecting dominant side as late effect of cerebrovascular accident (HCC) 12/07/2014  . Hyperlipemia   . Hypertension   . Menopausal syndrome    [name] [place])  . Overactive bladder   . Statin intolerance      Social History   Socioeconomic History  . Marital status: Married    Spouse name: Not on file  . Number of children: 4  . Years of education: Not on file  . Highest education level: Not on file  Occupational History  . Occupation: retired  Engineer, production  . Financial resource strain: Not on file  . Food insecurity:    Worry: Not on file    Inability: Not on file  . Transportation needs:    Medical: Not on file    Non-medical: Not on file  Tobacco Use  . Smoking status: Never Smoker  . Smokeless tobacco: Never Used  Substance and Sexual Activity  . Alcohol use: No  . Drug use: No  . Sexual activity: Not on file  Lifestyle  . Physical activity:    Days per week: Not on file    Minutes per session: Not on file  . Stress: Not on file  Relationships  . Social connections:    Talks on phone: Not on file    Gets together: Not on file    Attends religious service: Not on file    Active member of club  or organization: Not on file    Attends meetings of clubs or organizations: Not on file    Relationship status: Not on file  . Intimate partner violence:    Fear of current or ex partner: Not on file    Emotionally abused: Not on file    Physically abused: Not on file    Forced sexual activity: Not on file  Other Topics Concern  . Not on file  Social History Narrative   Regular exercise, 4 children, 6 grandchildren, one stepchild and two additional stepgrandchildren   Patient is right handed.   Patient doesn't drink caffeine.    Past Surgical History:  Procedure Laterality Date  . ABDOMINAL HYSTERECTOMY     1985  . BAND HEMORRHOIDECTOMY     2010  . CHOLECYSTECTOMY     1950  . PAROTIDECTOMY     30 years ago    Family History  Problem Relation Age of Onset  . Heart failure Father   . Diabetes Mother   . Hypertension Mother   . Stroke Mother   . Cirrhosis Sister   . Diabetes Sister   . Hyperlipidemia Brother   . Hypertension Brother   . Stroke Brother   . Diabetes Brother     Allergies  Allergen Reactions  . Statins Other (See Comments)    Muscle pain and unable to walk Muscle pain and unable to walk  . Latex Other (See Comments)    Burns her skin    Current Outpatient Medications on File Prior to Visit  Medication Sig Dispense Refill  . aspirin EC 325 MG EC tablet Take 1 tablet (325 mg total) by mouth daily. 100 tablet 1  . conjugated estrogens (PREMARIN) vaginal cream Place 0.5 g vaginally.    . diazepam (VALIUM) 2 MG tablet Take 1 tablet (2 mg total) by mouth every 6 (six) hours as needed for anxiety. 6 tablet 0  . ezetimibe (ZETIA) 10 MG tablet Take 1 tablet (10 mg total) by mouth daily. 90 tablet 5  . fesoterodine (TOVIAZ) 4 MG TB24 tablet Take 1 tablet (4 mg total) by mouth daily. 30 tablet 5  . lisinopril (PRINIVIL,ZESTRIL) 20 MG tablet TAKE 1 TABLET(20 MG) BY MOUTH DAILY 90 tablet 3  . VOLTAREN 1 % GEL APPLY 2 GRAMS TOPICALLY FOUR TIMES DAILY 300 g 0    No current facility-administered medications on file prior to visit.     BP 110/70 (BP Location: Right Arm, Patient Position: Sitting, Cuff Size: Normal)   Pulse 88   Temp 98.9 F (37.2 C) (Oral)   Wt 136 lb (61.7 kg)   SpO2 96%   BMI 21.95 kg/m      Review of Systems  Constitutional: Negative.   HENT: Negative for congestion, dental problem, hearing loss, rhinorrhea, sinus pressure, sore throat and tinnitus.   Eyes: Negative for pain, discharge and visual disturbance.  Respiratory: Negative for cough and shortness of breath.   Cardiovascular: Negative for chest pain, palpitations and leg swelling.  Gastrointestinal: Negative for abdominal distention, abdominal pain, blood in stool, constipation, diarrhea, nausea and vomiting.  Genitourinary: Negative for difficulty urinating, dysuria, flank pain, frequency, hematuria, pelvic pain, urgency, vaginal bleeding, vaginal discharge and vaginal pain.  Musculoskeletal: Negative for arthralgias, gait problem and joint swelling.  Skin: Negative for rash.  Neurological: Positive for dizziness, syncope and light-headedness. Negative for speech difficulty, weakness, numbness and headaches.  Hematological: Negative for adenopathy.  Psychiatric/Behavioral: Negative for agitation, behavioral problems and dysphoric mood. The patient is not nervous/anxious.        Objective:   Physical Exam  Constitutional: She is oriented to person, place, and time. She appears well-developed and well-nourished. No distress.  Alert and appropriate Blood pressure 108/64 Orthostatic blood pressure reading not attempted  HENT:  Head: Normocephalic.  Right Ear: External ear normal.  Left Ear: External ear normal.  Mouth/Throat: Oropharynx is clear and moist.  Eyes: Pupils are equal, round, and reactive to light. Conjunctivae and EOM are normal.  Neck: Normal range of motion. Neck supple. No thyromegaly present.  Cardiovascular: Normal rate, regular rhythm,  normal heart sounds and intact distal pulses.  Pulmonary/Chest: Effort normal and breath sounds normal.  Back brace  Abdominal: Soft. Bowel sounds are normal. She exhibits no mass. There is no tenderness.  Musculoskeletal: Normal range of motion.  Lymphadenopathy:    She has no cervical adenopathy.  Neurological: She is alert and oriented to person, place, and time.  Skin: Skin is warm and dry. No rash noted.  Psychiatric: She has a normal mood and affect. Her behavior is normal.          Assessment & Plan:   Vasovagal episode versus orthostatic hypotension (episode occurred while patient sitting).  Blood pressure is low normal at present.  Will encourage liberal fluid intake and hold lisinopril.  Continue close home blood pressure monitoring Status post laminectomy for lumbar spinal stenosis History of cerebral vascular disease  Will check updated lab Discontinue lisinopril  Gordy SaversPeter F Jahzeel [place]

## 2018-08-05 NOTE — Patient Instructions (Signed)
Hold lisinopril at this time  Continue physical therapy as scheduled  Surgical follow-up as scheduled

## 2018-08-06 ENCOUNTER — Telehealth: Payer: Self-pay | Admitting: Internal Medicine

## 2018-08-06 NOTE — Telephone Encounter (Signed)
Please advise on labs.

## 2018-08-06 NOTE — Telephone Encounter (Signed)
Copied from CRM #[id number]. Topic: Inquiry >> Aug 07, [id number]  3:21 PM Baldo DaubAlexander, Amber L wrote: Reason for CRM:   Pt had labs done yesterday and pt's daughter is calling to see if she can get results.  Elnita MaxwellCheryl can be reached at [phone]

## 2018-08-07 NOTE — Telephone Encounter (Signed)
Pt daughter given lab results and verbalized understanding. No further action needed!

## 2018-08-07 NOTE — Telephone Encounter (Signed)
Please call/notify patient that lab/test/procedure is normal 

## 2018-10-27 LAB — HM MAMMOGRAPHY

## 2018-10-29 ENCOUNTER — Encounter: Payer: Self-pay | Admitting: Internal Medicine

## 2018-12-02 ENCOUNTER — Ambulatory Visit (INDEPENDENT_AMBULATORY_CARE_PROVIDER_SITE_OTHER): Payer: Medicare Other | Admitting: Internal Medicine

## 2018-12-02 ENCOUNTER — Encounter: Payer: Self-pay | Admitting: Internal Medicine

## 2018-12-02 VITALS — BP 142/88 | HR 76 | Temp 98.0°F | Ht 66.0 in | Wt 137.9 lb

## 2018-12-02 DIAGNOSIS — Z8673 Personal history of transient ischemic attack (TIA), and cerebral infarction without residual deficits: Secondary | ICD-10-CM | POA: Diagnosis not present

## 2018-12-02 DIAGNOSIS — E7800 Pure hypercholesterolemia, unspecified: Secondary | ICD-10-CM | POA: Diagnosis not present

## 2018-12-02 DIAGNOSIS — T466X5A Adverse effect of antihyperlipidemic and antiarteriosclerotic drugs, initial encounter: Secondary | ICD-10-CM

## 2018-12-02 DIAGNOSIS — I10 Essential (primary) hypertension: Secondary | ICD-10-CM

## 2018-12-02 DIAGNOSIS — G720 Drug-induced myopathy: Secondary | ICD-10-CM

## 2018-12-02 DIAGNOSIS — M48061 Spinal stenosis, lumbar region without neurogenic claudication: Secondary | ICD-10-CM

## 2018-12-02 LAB — LIPID PANEL
Cholesterol: 243 mg/dL — ABNORMAL HIGH (ref 0–200)
HDL: 61 mg/dL (ref >39.00)
LDL Cholesterol: 159 mg/dL — ABNORMAL HIGH (ref 0–99)
NonHDL: 181.75
Total CHOL/HDL Ratio: 4
Triglycerides: 112 mg/dL (ref 0.0–149.0)
VLDL: 22.4 mg/dL (ref 0.0–40.0)

## 2018-12-02 NOTE — Progress Notes (Signed)
Established Patient Office Visit     CC/Reason for Visit: Establish care, follow-up on chronic medical conditions  HPI: Teresa Martinez is a 78 y.o. female who is coming in today for the above mentioned reasons. Past Medical History is significant for: Hyperlipidemia with LDL that has not been at goal due to significant statin intolerance with significant myopathy and completely resolved after statin discontinuation.  She has tried 2 different statins in the recent past.  She is on Zetia 10 mg daily.  She has a past history of hypertension however was taken off lisinopril in September when she had a vasovagal syncope with concerns for orthostatic hypotension.  Since then, when she takes blood pressures at home, she has noticed measurements in the 140s over 80 range.  She wonders whether she may need to resume antihypertensive medication.  She is very interested in having her lipids rechecked today.  Lipids were last checked in August 2019, at that time her LDL was 176, due to significant statin myopathy she has been treated with ezetimibe and diet.  Her LDL did decrease close to 25 points in the first 3 months after lifestyle modifications.  She had significant lumbar spine stenosis culminating in surgery this past September, she has graduated from using a walker to using a cane.  She has no acute complaints at today's visit.   Past Medical/Surgical History: Past Medical History:  Diagnosis Date  . Cervical spine degeneration    C5/C6  . Chronic right shoulder pain   . Hemiparesis affecting dominant side as late effect of cerebrovascular accident (HCC) 12/07/2014  . Hyperlipemia   . Hypertension   . Menopausal syndrome    [name]  . Overactive bladder   . Statin intolerance     Past Surgical History:  Procedure Laterality Date  . ABDOMINAL HYSTERECTOMY     1985  . BAND HEMORRHOIDECTOMY     2010  . CHOLECYSTECTOMY     1950  . PAROTIDECTOMY     30 years ago     Social History:  reports that she has never smoked. She has never used smokeless tobacco. She reports that she does not drink alcohol or use drugs.  Allergies: Allergies  Allergen Reactions  . Statins Other (See Comments)    Muscle pain and unable to walk Muscle pain and unable to walk  . Latex Other (See Comments)    Burns her skin    Family History:  Family History  Problem Relation Age of Onset  . Heart failure Father   . Diabetes Mother   . Hypertension Mother   . Stroke Mother   . Cirrhosis Sister   . Diabetes Sister   . Hyperlipidemia Brother   . Hypertension Brother   . Stroke Brother   . Diabetes Brother      Current Outpatient Medications:  .  aspirin EC 325 MG EC tablet, Take 1 tablet (325 mg total) by mouth daily., Disp: 100 tablet, Rfl: 1 .  conjugated estrogens (PREMARIN) vaginal cream, Place 0.5 g vaginally., Disp: , Rfl:  .  diazepam (VALIUM) 2 MG tablet, Take 1 tablet (2 mg total) by mouth every 6 (six) hours as needed for anxiety., Disp: 6 tablet, Rfl: 0 .  ezetimibe (ZETIA) 10 MG tablet, Take 1 tablet (10 mg total) by mouth daily., Disp: 90 tablet, Rfl: 5 .  fesoterodine (TOVIAZ) 4 MG TB24 tablet, Take 1 tablet (4 mg total) by mouth daily., Disp: 30 tablet, Rfl: 5 .  lisinopril (PRINIVIL,ZESTRIL) 20 MG tablet, TAKE 1 TABLET(20 MG) BY MOUTH DAILY (Patient not taking: Reported on 12/02/2018), Disp: 90 tablet, Rfl: 3  Review of Systems:  Constitutional: Denies fever, chills, diaphoresis, appetite change and fatigue.  HEENT: Denies photophobia, eye pain, redness, hearing loss, ear pain, congestion, sore throat, rhinorrhea, sneezing, mouth sores, trouble swallowing, neck pain, neck stiffness and tinnitus.   Respiratory: Denies SOB, DOE, cough, chest tightness,  and wheezing.   Cardiovascular: Denies chest pain, palpitations and leg swelling.  Gastrointestinal: Denies nausea, vomiting, abdominal pain, diarrhea, constipation, blood in stool and abdominal  distention.  Genitourinary: Denies dysuria, urgency, frequency, hematuria, flank pain and difficulty urinating.  Endocrine: Denies: hot or cold intolerance, sweats, changes in hair or nails, polyuria, polydipsia. Musculoskeletal: Denies myalgias, back pain, joint swelling, arthralgias and gait problem.  Skin: Denies pallor, rash and wound.  Neurological: Denies dizziness, seizures, syncope, weakness, light-headedness, numbness and headaches.  Hematological: Denies adenopathy. Easy bruising, personal or family bleeding history  Psychiatric/Behavioral: Denies suicidal ideation, mood changes, confusion, nervousness, sleep disturbance and agitation    Physical Exam: Vitals:   12/02/18 0930  BP: (!) 142/88  Pulse: 76  Temp: 98 F (36.7 C)  TempSrc: Oral  SpO2: 98%  Weight: 137 lb 14.4 oz (62.6 kg)  Height: 5\' 6"  (1.676 m)    Body mass index is 22.26 kg/m.   Constitutional: NAD, calm, comfortable Eyes: PERRL, lids and conjunctivae normal ENMT: Mucous membranes are moist.  Neck: normal, supple, no masses, no thyromegaly Respiratory: clear to auscultation bilaterally, no wheezing, no crackles. Normal respiratory effort. No accessory muscle use.  Cardiovascular: Regular rate and rhythm, no murmurs / rubs / gallops. No extremity edema. 2+ pedal pulses. No carotid bruits.  Abdomen: no tenderness, no masses palpated. No hepatosplenomegaly. Bowel sounds positive.  Musculoskeletal: no clubbing / cyanosis. No joint deformity upper and lower extremities. Good ROM, no contractures. Normal muscle tone.  Skin: no rashes, lesions, ulcers. No induration Neurologic: CN 2-12 grossly intact. Sensation intact, DTR normal. Psychiatric: Normal judgment and insight. Alert and oriented x 3. Normal mood.    Impression and Plan:  Pure hypercholesterolemia Statin myopathy -On Zetia plus lifestyle modifications. -Last LDL was 176 in August 2019. -Per patient request we will recheck lipids today, she is  fasting.   Essential hypertension -Not well controlled, measurements today have ranged between 142/88, 180/95. -She would like to attempt a trial period of six weeks and do ambulatory blood pressure monitoring before resuming blood pressure medication. -If needs medication she has lisinopril leftover from before.  H/O: CVA (cerebrovascular accident) -Noted, recovered well from this.  She uses a cane but this is mainly from her recent spinal surgery and outcomes.  Degenerative lumbar spinal stenosis -Status post surgery in September 2019.        Chaya Jan, MD [facility] at [facility]

## 2019-01-13 ENCOUNTER — Encounter: Payer: Self-pay | Admitting: Internal Medicine

## 2019-01-13 ENCOUNTER — Ambulatory Visit (INDEPENDENT_AMBULATORY_CARE_PROVIDER_SITE_OTHER): Payer: Medicare Other | Admitting: Internal Medicine

## 2019-01-13 VITALS — BP 140/94 | HR 84 | Temp 98.0°F | Wt 141.8 lb

## 2019-01-13 DIAGNOSIS — I10 Essential (primary) hypertension: Secondary | ICD-10-CM

## 2019-01-13 MED ORDER — LISINOPRIL 10 MG PO TABS: 10.0000 mg | ORAL_TABLET | Freq: Every day | ORAL | 3 refills | Status: DC

## 2019-01-13 NOTE — Patient Instructions (Signed)
-  Nice seeing you today!  -Start taking lisinopril 10 mg daily.  -Check your blood pressure 2-3 times a week at home and bring those measurements in to your next visit.  -Schedule follow up in 6-8 weeks for blood pressure check.

## 2019-01-13 NOTE — Progress Notes (Signed)
Established Patient Office Visit     CC/Reason for Visit: BP follow up  HPI: Teresa Martinez is a 78 y.o. female who is coming in today for the above mentioned reasons. Past Medical History is significant for: Hyperlipidemia with LDL that has not been at goal due to significant statin intolerance with significant myopathy and completely resolved after statin discontinuation.  She has tried 2 different statins in the recent past.  She is on Zetia 10 mg daily.  She has a past history of hypertension however was taken off lisinopril in September when she had a vasovagal syncope with concerns for orthostatic hypotension.  Since then, when she takes blood pressures at home, she has noticed measurements in the 140s over 80 range. She had significant lumbar spine stenosis culminating in surgery this past September, she has graduated from using a walker to using a cane, and now is walking unassisted.  She has no acute complaints at today's visit.  She brings in her blood pressure logs and her blood pressures have consistently been above 140/80.  Blood pressure in the office today is 140/94.   Past Medical/Surgical History: Past Medical History:  Diagnosis Date  . Cervical spine degeneration    C5/C6  . Chronic right shoulder pain   . Hemiparesis affecting dominant side as late effect of cerebrovascular accident (HCC) 12/07/2014  . Hyperlipemia   . Hypertension   . Menopausal syndrome    [name] [place])  . Overactive bladder   . Statin intolerance     Past Surgical History:  Procedure Laterality Date  . ABDOMINAL HYSTERECTOMY     1985  . BAND HEMORRHOIDECTOMY     2010  . CHOLECYSTECTOMY     1950  . PAROTIDECTOMY     30 years ago    Social History:  reports that she has never smoked. She has never used smokeless tobacco. She reports that she does not drink alcohol or use drugs.  Allergies: Allergies  Allergen Reactions  . Statins Other (See Comments)    Muscle  pain and unable to walk Muscle pain and unable to walk  . Latex Other (See Comments)    Burns her skin    Family History:  Family History  Problem Relation Age of Onset  . Heart failure Father   . Diabetes Mother   . Hypertension Mother   . Stroke Mother   . Cirrhosis Sister   . Diabetes Sister   . Hyperlipidemia Brother   . Hypertension Brother   . Stroke Brother   . Diabetes Brother      Current Outpatient Medications:  .  aspirin EC 325 MG EC tablet, Take 1 tablet (325 mg total) by mouth daily., Disp: 100 tablet, Rfl: 1 .  conjugated estrogens (PREMARIN) vaginal cream, Place 0.5 g vaginally., Disp: , Rfl:  .  diazepam (VALIUM) 2 MG tablet, Take 1 tablet (2 mg total) by mouth every 6 (six) hours as needed for anxiety., Disp: 6 tablet, Rfl: 0 .  ezetimibe (ZETIA) 10 MG tablet, Take 1 tablet (10 mg total) by mouth daily., Disp: 90 tablet, Rfl: 5 .  fesoterodine (TOVIAZ) 4 MG TB24 tablet, Take 1 tablet (4 mg total) by mouth daily., Disp: 30 tablet, Rfl: 5 .  lisinopril (PRINIVIL,ZESTRIL) 10 MG tablet, Take 1 tablet (10 mg total) by mouth daily., Disp: 90 tablet, Rfl: 3  Review of Systems:  Constitutional: Denies fever, chills, diaphoresis, appetite change and fatigue.  HEENT: Denies photophobia, eye pain, redness,  hearing loss, ear pain, congestion, sore throat, rhinorrhea, sneezing, mouth sores, trouble swallowing, neck pain, neck stiffness and tinnitus.   Respiratory: Denies SOB, DOE, cough, chest tightness,  and wheezing.   Cardiovascular: Denies chest pain, palpitations and leg swelling.  Gastrointestinal: Denies nausea, vomiting, abdominal pain, diarrhea, constipation, blood in stool and abdominal distention.  Genitourinary: Denies dysuria, urgency, frequency, hematuria, flank pain and difficulty urinating.  Endocrine: Denies: hot or cold intolerance, sweats, changes in hair or nails, polyuria, polydipsia. Musculoskeletal: Denies myalgias, back pain, joint swelling,  arthralgias and gait problem.  Skin: Denies pallor, rash and wound.  Neurological: Denies dizziness, seizures, syncope, weakness, light-headedness, numbness and headaches.  Hematological: Denies adenopathy. Easy bruising, personal or family bleeding history  Psychiatric/Behavioral: Denies suicidal ideation, mood changes, confusion, nervousness, sleep disturbance and agitation    Physical Exam: Vitals:   01/13/19 1125  BP: (!) 140/94  Pulse: 84  Temp: 98 F (36.7 C)  TempSrc: Oral  SpO2: 96%  Weight: 141 lb 12.8 oz (64.3 kg)    Body mass index is 22.89 kg/m.   Constitutional: NAD, calm, comfortable Eyes: PERRL, lids and conjunctivae normal ENMT: Mucous membranes are moist.  Respiratory: clear to auscultation bilaterally, no wheezing, no crackles. Normal respiratory effort. No accessory muscle use.  Cardiovascular: Regular rate and rhythm, no murmurs / rubs / gallops. No extremity edema. 2+ pedal pulses. No carotid bruits.  Musculoskeletal: no clubbing / cyanosis. No joint deformity upper and lower extremities. Good ROM, no contractures. Normal muscle tone.  Psychiatric: Normal judgment and insight. Alert and oriented x 3. Normal mood.    Impression and Plan:  Essential hypertension  -Above goal. -Will start lisinopril today.  She had orthostatic hypotension and syncopal episode on 20 mg, will reduce dose in half to 10 mg.  She will return in 6 to 8 weeks for blood pressure follow-up.    Patient Instructions  -Nice seeing you today!  -Start taking lisinopril 10 mg daily.  -Check your blood pressure 2-3 times a week at home and bring those measurements in to your next visit.  -Schedule follow up in 6-8 weeks for blood pressure check.     Chaya Jan, MD [facility] at [facility]

## 2019-03-01 ENCOUNTER — Telehealth: Payer: Self-pay | Admitting: *Deleted

## 2019-03-01 NOTE — Telephone Encounter (Signed)
Appointment has been rescheduled per patient request.

## 2019-03-01 NOTE — Telephone Encounter (Signed)
Copied from CRM [id number]. Topic: General - Other >> Feb 26, 2019 10:45 AM Elliot Gault wrote: Relation to pt: self Call back number:[phone]   Reason for call:  Patient would like to cancel her 03/16/2019 4 month follow up appointment and states it was for a BP check. Patient states she has been taking her BP at home and all is well. Informed patient she will be contacted regarding virtual appointment options and [facility], please advise. WEB EX is an option

## 2019-03-16 ENCOUNTER — Ambulatory Visit: Payer: Medicare Other | Admitting: Internal Medicine

## 2019-03-18 ENCOUNTER — Other Ambulatory Visit: Payer: Self-pay | Admitting: *Deleted

## 2019-03-18 MED ORDER — EZETIMIBE 10 MG PO TABS: 10.0000 mg | ORAL_TABLET | Freq: Every day | ORAL | 1 refills | Status: DC

## 2019-03-18 MED ORDER — FESOTERODINE FUMARATE ER 4 MG PO TB24: 4.0000 mg | ORAL_TABLET | Freq: Every day | ORAL | 1 refills | Status: DC

## 2019-03-22 ENCOUNTER — Telehealth: Payer: Self-pay | Admitting: *Deleted

## 2019-03-22 NOTE — Telephone Encounter (Signed)
Copied from CRM [id number]. Topic: General - Other >> Mar 22, 2019 10:31 AM Percival Spanish wrote:  Pt would like a call back concerning the below medication   fesoterodine (TOVIAZ) 4 MG TB24 tablet

## 2019-03-23 ENCOUNTER — Telehealth: Payer: Self-pay

## 2019-03-23 NOTE — Telephone Encounter (Signed)
PA for fesoterodine (TOVIAZ) 4 MG TB24 tablet has been sent to cover my meds.  Shawna Clamp - PA Case ID: [id number]

## 2019-03-23 NOTE — Telephone Encounter (Signed)
Spoke with patient and she is requesting a prior-auth for her Teresa Martinez.

## 2019-03-25 NOTE — Telephone Encounter (Signed)
PA has been approved through 11/18/2019. 

## 2019-03-25 NOTE — Telephone Encounter (Signed)
Left message on machine for patient to let her know the PA was approved.

## 2019-04-23 ENCOUNTER — Telehealth: Payer: Self-pay | Admitting: *Deleted

## 2019-04-23 NOTE — Telephone Encounter (Signed)
Copied from CRM [id number]. Topic: General - Inquiry >> Apr 22, 2019  3:43 PM Lynne Logan D wrote: Reason for CRM: Pt stated she does not think she needs her appt on 04/27/19. She would like a call back regarding appt and what Dr. Ardyth Harps thinks. Please advise. [phone]

## 2019-04-27 ENCOUNTER — Ambulatory Visit: Payer: Self-pay | Admitting: Internal Medicine

## 2019-07-23 ENCOUNTER — Encounter: Payer: Self-pay | Admitting: Internal Medicine

## 2019-07-23 ENCOUNTER — Other Ambulatory Visit: Payer: Self-pay

## 2019-07-23 ENCOUNTER — Ambulatory Visit (INDEPENDENT_AMBULATORY_CARE_PROVIDER_SITE_OTHER): Payer: Medicare Other | Admitting: Internal Medicine

## 2019-07-23 VITALS — BP 110/78 | HR 82 | Temp 98.2°F | Ht 66.0 in | Wt 141.5 lb

## 2019-07-23 DIAGNOSIS — E7800 Pure hypercholesterolemia, unspecified: Secondary | ICD-10-CM

## 2019-07-23 DIAGNOSIS — Z0000 Encounter for general adult medical examination without abnormal findings: Secondary | ICD-10-CM

## 2019-07-23 DIAGNOSIS — Z23 Encounter for immunization: Secondary | ICD-10-CM

## 2019-07-23 DIAGNOSIS — H6123 Impacted cerumen, bilateral: Secondary | ICD-10-CM

## 2019-07-23 DIAGNOSIS — I10 Essential (primary) hypertension: Secondary | ICD-10-CM | POA: Diagnosis not present

## 2019-07-23 DIAGNOSIS — Z1211 Encounter for screening for malignant neoplasm of colon: Secondary | ICD-10-CM

## 2019-07-23 LAB — CBC WITH DIFFERENTIAL/PLATELET
Basophils Absolute: 0 10*3/uL (ref 0.0–0.1)
Basophils Relative: 0.6 % (ref 0.0–3.0)
Eosinophils Absolute: 0.2 10*3/uL (ref 0.0–0.7)
Eosinophils Relative: 2.9 % (ref 0.0–5.0)
HCT: 43.7 % (ref 36.0–46.0)
Hemoglobin: 14.4 g/dL (ref 12.0–15.0)
Lymphocytes Relative: 26.9 % (ref 12.0–46.0)
Lymphs Abs: 1.5 10*3/uL (ref 0.7–4.0)
MCHC: 32.9 g/dL (ref 30.0–36.0)
MCV: 81.4 fl (ref 78.0–100.0)
Monocytes Absolute: 0.4 10*3/uL (ref 0.1–1.0)
Monocytes Relative: 7.6 % (ref 3.0–12.0)
Neutro Abs: 3.5 10*3/uL (ref 1.4–7.7)
Neutrophils Relative %: 62 % (ref 43.0–77.0)
Platelets: 225 10*3/uL (ref 150.0–400.0)
RBC: 5.37 Mil/uL — ABNORMAL HIGH (ref 3.87–5.11)
RDW: 14.9 % (ref 11.5–15.5)
WBC: 5.7 10*3/uL (ref 4.0–10.5)

## 2019-07-23 LAB — COMPREHENSIVE METABOLIC PANEL
ALT: 14 U/L (ref 0–35)
AST: 20 U/L (ref 0–37)
Albumin: 4.6 g/dL (ref 3.5–5.2)
Alkaline Phosphatase: 61 U/L (ref 39–117)
BUN: 16 mg/dL (ref 6–23)
CO2: 30 mEq/L (ref 19–32)
Calcium: 10.1 mg/dL (ref 8.4–10.5)
Chloride: 102 mEq/L (ref 96–112)
Creatinine, Ser: 0.86 mg/dL (ref 0.40–1.20)
GFR: 76.79 mL/min (ref >60.00)
Glucose, Bld: 87 mg/dL (ref 70–99)
Potassium: 4.6 mEq/L (ref 3.5–5.1)
Sodium: 141 mEq/L (ref 135–145)
Total Bilirubin: 0.5 mg/dL (ref 0.2–1.2)
Total Protein: 7.3 g/dL (ref 6.0–8.3)

## 2019-07-23 LAB — LIPID PANEL
Cholesterol: 273 mg/dL — ABNORMAL HIGH (ref 0–200)
HDL: 54 mg/dL (ref >39.00)
LDL Cholesterol: 193 mg/dL — ABNORMAL HIGH (ref 0–99)
NonHDL: 219.27
Total CHOL/HDL Ratio: 5
Triglycerides: 129 mg/dL (ref 0.0–149.0)
VLDL: 25.8 mg/dL (ref 0.0–40.0)

## 2019-07-23 LAB — HEMOGLOBIN A1C: Hgb A1c MFr Bld: 6 % (ref 4.6–6.5)

## 2019-07-23 LAB — VITAMIN B12: Vitamin B-12: 1237 pg/mL — ABNORMAL HIGH (ref 211–911)

## 2019-07-23 LAB — VITAMIN D 25 HYDROXY (VIT D DEFICIENCY, FRACTURES): VITD: 42.71 ng/mL (ref 30.00–100.00)

## 2019-07-23 LAB — TSH: TSH: 0.9 u[IU]/mL (ref 0.35–4.50)

## 2019-07-23 MED ORDER — EZETIMIBE 10 MG PO TABS: 10.0000 mg | ORAL_TABLET | Freq: Every day | ORAL | 1 refills | Status: DC

## 2019-07-23 NOTE — Patient Instructions (Signed)
-Nice seeing you today!!  -Lab work today; will notify you once results are available.  -Flu vaccine today.  -Schedule follow up in 6 months.   Preventive Care 65 Years and Older, Female Preventive care refers to lifestyle choices and visits with your health care provider that can promote health and wellness. This includes:  A yearly physical exam. This is also called an annual well check.  Regular dental and eye exams.  Immunizations.  Screening for certain conditions.  Healthy lifestyle choices, such as diet and exercise. What can I expect for my preventive care visit? Physical exam Your health care provider will check:  Height and weight. These may be used to calculate body mass index (BMI), which is a measurement that tells if you are at a healthy weight.  Heart rate and blood pressure.  Your skin for abnormal spots. Counseling Your health care provider may ask you questions about:  Alcohol, tobacco, and drug use.  Emotional well-being.  Home and relationship well-being.  Sexual activity.  Eating habits.  History of falls.  Memory and ability to understand (cognition).  Work and work environment.  Pregnancy and menstrual history. What immunizations do I need?  Influenza (flu) vaccine  This is recommended every year. Tetanus, diphtheria, and pertussis (Tdap) vaccine  You may need a Td booster every 10 years. Varicella (chickenpox) vaccine  You may need this vaccine if you have not already been vaccinated. Zoster (shingles) vaccine  You may need this after age 60. Pneumococcal conjugate (PCV13) vaccine  One dose is recommended after age 65. Pneumococcal polysaccharide (PPSV23) vaccine  One dose is recommended after age 65. Measles, mumps, and rubella (MMR) vaccine  You may need at least one dose of MMR if you were born in 1957 or later. You may also need a second dose. Meningococcal conjugate (MenACWY) vaccine  You may need this if you have  certain conditions. Hepatitis A vaccine  You may need this if you have certain conditions or if you travel or work in places where you may be exposed to hepatitis A. Hepatitis B vaccine  You may need this if you have certain conditions or if you travel or work in places where you may be exposed to hepatitis B. Haemophilus influenzae type b (Hib) vaccine  You may need this if you have certain conditions. You may receive vaccines as individual doses or as more than one vaccine together in one shot (combination vaccines). Talk with your health care provider about the risks and benefits of combination vaccines. What tests do I need? Blood tests  Lipid and cholesterol levels. These may be checked every 5 years, or more frequently depending on your overall health.  Hepatitis C test.  Hepatitis B test. Screening  Lung cancer screening. You may have this screening every year starting at age 55 if you have a 30-pack-year history of smoking and currently smoke or have quit within the past 15 years.  Colorectal cancer screening. All adults should have this screening starting at age 50 and continuing until age 75. Your health care provider may recommend screening at age 45 if you are at increased risk. You will have tests every 1-10 years, depending on your results and the type of screening test.  Diabetes screening. This is done by checking your blood sugar (glucose) after you have not eaten for a while (fasting). You may have this done every 1-3 years.  Mammogram. This may be done every 1-2 years. Talk with your health care provider about   how often you should have regular mammograms.  BRCA-related cancer screening. This may be done if you have a family history of breast, ovarian, tubal, or peritoneal cancers. Other tests  Sexually transmitted disease (STD) testing.  Bone density scan. This is done to screen for osteoporosis. You may have this done starting at age 49. Follow these  instructions at home: Eating and drinking  Eat a diet that includes fresh fruits and vegetables, whole grains, lean protein, and low-fat dairy products. Limit your intake of foods with high amounts of sugar, saturated fats, and salt.  Take vitamin and mineral supplements as recommended by your health care provider.  Do not drink alcohol if your health care provider tells you not to drink.  If you drink alcohol: ? Limit how much you have to 0-1 drink a day. ? Be aware of how much alcohol is in your drink. In the [place] one drink equals one 12 oz bottle of beer (355 mL), one 5 oz glass of wine (148 mL), or one 1 oz glass of hard liquor (44 mL). Lifestyle  Take daily care of your teeth and gums.  Stay active. Exercise for at least 30 minutes on 5 or more days each week.  Do not use any products that contain nicotine or tobacco, such as cigarettes, e-cigarettes, and chewing tobacco. If you need help quitting, ask your health care provider.  If you are sexually active, practice safe sex. Use a condom or other form of protection in order to prevent STIs (sexually transmitted infections).  Talk with your health care provider about taking a low-dose aspirin or statin. What's next?  Go to your health care provider once a year for a well check visit.  Ask your health care provider how often you should have your eyes and teeth checked.  Stay up to date on all vaccines. This information is not intended to replace advice given to you by your health care provider. Make sure you discuss any questions you have with your health care provider. Document Released: 12/01/2015 Document Revised: 10/29/2018 Document Reviewed: 10/29/2018 Elsevier Patient Education  2020 [employer].

## 2019-07-23 NOTE — Progress Notes (Signed)
Established Patient Office Visit     CC/Reason for Visit: Annual Preventive exam and Subsequent Medicare Wellness visit  HPI: Teresa Martinez is a 78 y.o. female who is coming in today for the above mentioned reasons. Past Medical History is significant for: Hyperlipidemia with a statin intolerance on Zetia, hypertension on 10 mg of lisinopril that has been well controlled recently, she also has lumbar spinal stenosis and underwent back surgery last year, she is now walking independently.  She is in remarkably good shape for her age, she looks much younger than her age of 32.  She had cataract surgery bilaterally in July and has recovered well from that.  She has routine eye and dental care.  She is interested in getting her flu vaccine today, will obtain shingles vaccine at pharmacy.  She follows with GYN who does her Pap smears, mammograms and bone densities, she is due for a bone density.  She had a colonoscopy in 2014 and is a 5-year follow-up, she insists on continuing colon cancer screening, will refer back to Dr. Collene Mares.   Past Medical/Surgical History: Past Medical History:  Diagnosis Date  . Cervical spine degeneration    C5/C6  . Chronic right shoulder pain   . Hemiparesis affecting dominant side as late effect of cerebrovascular accident ([place]) 12/07/2014  . Hyperlipemia   . Hypertension   . Menopausal syndrome    Teresa Martinez)  . Overactive bladder   . Statin intolerance     Past Surgical History:  Procedure Laterality Date  . ABDOMINAL HYSTERECTOMY     1985  . BAND HEMORRHOIDECTOMY     2010  . CHOLECYSTECTOMY     1950  . PAROTIDECTOMY     30 years ago    Social History:  reports that she has never smoked. She has never used smokeless tobacco. She reports that she does not drink alcohol or use drugs.  Allergies: Allergies  Allergen Reactions  . Statins Other (See Comments)    Muscle pain and unable to walk Muscle pain and unable to walk  .  Latex Other (See Comments)    Burns her skin    Family History:  Family History  Problem Relation Age of Onset  . Heart failure Father   . Diabetes Mother   . Hypertension Mother   . Stroke Mother   . Cirrhosis Sister   . Diabetes Sister   . Hyperlipidemia Brother   . Hypertension Brother   . Stroke Brother   . Diabetes Brother      Current Outpatient Medications:  .  aspirin EC 325 MG EC tablet, Take 1 tablet (325 mg total) by mouth daily., Disp: 100 tablet, Rfl: 1 .  conjugated estrogens (PREMARIN) vaginal cream, Place 0.5 g vaginally., Disp: , Rfl:  .  diazepam (VALIUM) 2 MG tablet, Take 1 tablet (2 mg total) by mouth every 6 (six) hours as needed for anxiety., Disp: 6 tablet, Rfl: 0 .  ezetimibe (ZETIA) 10 MG tablet, Take 1 tablet (10 mg total) by mouth daily., Disp: 90 tablet, Rfl: 1 .  fesoterodine (TOVIAZ) 4 MG TB24 tablet, Take 1 tablet (4 mg total) by mouth daily., Disp: 90 tablet, Rfl: 1 .  lisinopril (PRINIVIL,ZESTRIL) 10 MG tablet, Take 1 tablet (10 mg total) by mouth daily., Disp: 90 tablet, Rfl: 3  Review of Systems:  Constitutional: Denies fever, chills, diaphoresis, appetite change and fatigue.  HEENT: Denies photophobia, eye pain, redness, hearing loss, ear pain, congestion, sore throat,  rhinorrhea, sneezing, mouth sores, trouble swallowing, neck pain, neck stiffness and tinnitus.   Respiratory: Denies SOB, DOE, cough, chest tightness,  and wheezing.   Cardiovascular: Denies chest pain, palpitations and leg swelling.  Gastrointestinal: Denies nausea, vomiting, abdominal pain, diarrhea, constipation, blood in stool and abdominal distention.  Genitourinary: Denies dysuria, urgency, frequency, hematuria, flank pain and difficulty urinating.  Endocrine: Denies: hot or cold intolerance, sweats, changes in hair or nails, polyuria, polydipsia. Musculoskeletal: Denies myalgias, back pain, joint swelling, arthralgias and gait problem.  Skin: Denies pallor, rash and wound.   Neurological: Denies dizziness, seizures, syncope, weakness, light-headedness, numbness and headaches.  Hematological: Denies adenopathy. Easy bruising, personal or family bleeding history  Psychiatric/Behavioral: Denies suicidal ideation, mood changes, confusion, nervousness, sleep disturbance and agitation    Physical Exam: Vitals:   07/23/19 0843  BP: 110/78  Pulse: 82  Temp: 98.2 F (36.8 C)  TempSrc: Temporal  SpO2: 96%  Weight: 141 lb 8 oz (64.2 kg)  Height: '5\' 6"'  (1.676 m)    Body mass index is 22.84 kg/m.   Constitutional: NAD, calm, comfortable Eyes: PERRL, lids and conjunctivae normal ENMT: Mucous membranes are moist. ympanic membrane is obstructed by cerumen bilaterally  Neck: normal, supple, no masses, no thyromegaly Respiratory: clear to auscultation bilaterally, no wheezing, no crackles. Normal respiratory effort. No accessory muscle use.  Cardiovascular: Regular rate and rhythm, no murmurs / rubs / gallops. No extremity edema. 2+ pedal pulses. No carotid bruits.  Abdomen: no tenderness, no masses palpated. No hepatosplenomegaly. Bowel sounds positive.  Musculoskeletal: no clubbing / cyanosis. No joint deformity upper and lower extremities. Good ROM, no contractures. Normal muscle tone.  Skin: no rashes, lesions, ulcers. No induration Neurologic: CN 2-12 grossly intact. Sensation intact, DTR normal. Strength 5/5 in all 4.  Psychiatric: Normal judgment and insight. Alert and oriented x 3. Normal mood.    Subsequent Medicare wellness visit   1. Risk factors, based on past  M,S,F -cardiovascular disease risk factors include age, history of hypertension, history of hyperlipidemia   2.  Physical activities: She walks 2 miles 3 to 4 days a week   3.  Depression/mood:  Stable, no depression   4.  Hearing:  No issues   5.  ADL's: Independent in all ADLs   6.  Fall risk:  Low fall risk   7.  Home safety: No problems identified   8.  Height weight, and visual  acuity: Height and weight as above, visual acuity is 20/20 in each eye independently and together   9.  Counseling:  Advised routine follow-up with GYN and GI   10. Lab orders based on risk factors: Laboratory update will be reviewed   11. Referral :  GI for screening colonoscopy   12. Care plan:  Follow-up with me in 6 months   13. Cognitive assessment:  No cognitive impairment   14. Screening: Patient provided with a written and personalized 5-10 year screening schedule in the AVS.   yes   15. Provider List Update:   PCP, GI (Dr. Collene Mares), GYN (unknown provider), orthopedist with [facility], ophthalmologist (unknown provider)  16. Advance Directives: Full code     Office Visit from 07/23/2019 in [place] at [place]  PHQ-9 Total Score  0      Fall Risk  07/23/2019 12/02/2018 03/12/2018 02/17/2017 10/29/2016  Falls in the past year? 0 0 No No No  Comment - - - - -  Number falls in past yr: 0 0 - - -  Injury with Fall? 0 0 - - -  Risk for fall due to : - - - - -  Follow up - - - - -  Comment - - - - -     Impression and Plan:  Encounter for preventive health examination  -She has routine eye and dental care. -Due for flu and shingles vaccines.  She will receive flu in office today shingles at her pharmacy, otherwise vaccinations are up-to-date. -Healthy lifestyle has been discussed in detail. -Screening labs to be performed today. -Colonoscopy was in 2014 she is a 5-year follow-up, she insists on continuing colon cancer screening despite age, she is in good health so I believe this is reasonable, will refer back to GI. -She sees GYN routinely for Pap smears bone densities and mammograms, this was last performed in December 2019. -She is due for DEXA scan but prefers this be done by her GYN.  Essential hypertension  -Well-controlled on current regimen.  Pure hypercholesterolemia  -Last LDL was 159, she is intolerant to statins, continue ezetimibe.  Bilateral  impacted cerumen -Cerumen Desimpaction  Warm water was applied and gentle ear lavage performed on bilateral ears. There were no complications and following the desimpaction the tympanic membranes were visible. Tympanic membranes are intact following the procedure. Auditory canals are normal. The patient reported relief of symptoms after removal of cerumen.     Patient Instructions  -Nice seeing you today!!  -Lab work today; will notify you once results are available.  -Flu vaccine today.  -Schedule follow up in 6 months.   Preventive Care 81 Years and Older, Female Preventive care refers to lifestyle choices and visits with your health care provider that can promote health and wellness. This includes:  A yearly physical exam. This is also called an annual well check.  Regular dental and eye exams.  Immunizations.  Screening for certain conditions.  Healthy lifestyle choices, such as diet and exercise. What can I expect for my preventive care visit? Physical exam Your health care provider will check:  Height and weight. These may be used to calculate body mass index (BMI), which is a measurement that tells if you are at a healthy weight.  Heart rate and blood pressure.  Your skin for abnormal spots. Counseling Your health care provider may ask you questions about:  Alcohol, tobacco, and drug use.  Emotional well-being.  Home and relationship well-being.  Sexual activity.  Eating habits.  History of falls.  Memory and ability to understand (cognition).  Work and work Statistician.  Pregnancy and menstrual history. What immunizations do I need?  Influenza (flu) vaccine  This is recommended every year. Tetanus, diphtheria, and pertussis (Tdap) vaccine  You may need a Td booster every 10 years. Varicella (chickenpox) vaccine  You may need this vaccine if you have not already been vaccinated. Zoster (shingles) vaccine  You may need this after age  78. Pneumococcal conjugate (PCV13) vaccine  One dose is recommended after age 65. Pneumococcal polysaccharide (PPSV23) vaccine  One dose is recommended after age 68. Measles, mumps, and rubella (MMR) vaccine  You may need at least one dose of MMR if you were born in 1957 or later. You may also need a second dose. Meningococcal conjugate (MenACWY) vaccine  You may need this if you have certain conditions. Hepatitis A vaccine  You may need this if you have certain conditions or if you travel or work in places where you may be exposed to hepatitis A. Hepatitis B vaccine  You may need this if you have certain conditions or if you travel or work in places where you may be exposed to hepatitis B. Haemophilus influenzae type b (Hib) vaccine  You may need this if you have certain conditions. You may receive vaccines as individual doses or as more than one vaccine together in one shot (combination vaccines). Talk with your health care provider about the risks and benefits of combination vaccines. What tests do I need? Blood tests  Lipid and cholesterol levels. These may be checked every 5 years, or more frequently depending on your overall health.  Hepatitis C test.  Hepatitis B test. Screening  Lung cancer screening. You may have this screening every year starting at age 76 if you have a 30-pack-year history of smoking and currently smoke or have quit within the past 15 years.  Colorectal cancer screening. All adults should have this screening starting at age 35 and continuing until age 6. Your health care provider may recommend screening at age 58 if you are at increased risk. You will have tests every 1-10 years, depending on your results and the type of screening test.  Diabetes screening. This is done by checking your blood sugar (glucose) after you have not eaten for a while (fasting). You may have this done every 1-3 years.  Mammogram. This may be done every 1-2 years. Talk with  your health care provider about how often you should have regular mammograms.  BRCA-related cancer screening. This may be done if you have a family history of breast, ovarian, tubal, or peritoneal cancers. Other tests  Sexually transmitted disease (STD) testing.  Bone density scan. This is done to screen for osteoporosis. You may have this done starting at age 42. Follow these instructions at home: Eating and drinking  Eat a diet that includes fresh fruits and vegetables, whole grains, lean protein, and low-fat dairy products. Limit your intake of foods with high amounts of sugar, saturated fats, and salt.  Take vitamin and mineral supplements as recommended by your health care provider.  Do not drink alcohol if your health care provider tells you not to drink.  If you drink alcohol: ? Limit how much you have to 0-1 drink a day. ? Be aware of how much alcohol is in your drink. In the [place] one drink equals one 12 oz bottle of beer (355 mL), one 5 oz glass of wine (148 mL), or one 1 oz glass of hard liquor (44 mL). Lifestyle  Take daily care of your teeth and gums.  Stay active. Exercise for at least 30 minutes on 5 or more days each week.  Do not use any products that contain nicotine or tobacco, such as cigarettes, e-cigarettes, and chewing tobacco. If you need help quitting, ask your health care provider.  If you are sexually active, practice safe sex. Use a condom or other form of protection in order to prevent STIs (sexually transmitted infections).  Talk with your health care provider about taking a low-dose aspirin or statin. What's next?  Go to your health care provider once a year for a well check visit.  Ask your health care provider how often you should have your eyes and teeth checked.  Stay up to date on all vaccines. This information is not intended to replace advice given to you by your health care provider. Make sure you discuss any questions you have with your  health care provider. Document Released: 12/01/2015 Document Revised: 10/29/2018 Document Reviewed: 10/29/2018 Elsevier Patient  Education  2020 [place], [place] [facility]

## 2019-07-23 NOTE — Addendum Note (Signed)
Addended by: Westley Hummer B on: 07/23/2019 09:50 AM   Modules accepted: Orders

## 2019-09-20 ENCOUNTER — Telehealth: Payer: Self-pay | Admitting: *Deleted

## 2019-09-20 NOTE — Telephone Encounter (Signed)
Copied from [place] [id number]. Topic: General - Other >> Sep 20, 2019 10:52 AM Carolyn Stare wrote: [facility] said they rec a referral for pt and think it was a mistake, per office notes pt should be seeing Dr Collene Mares who is not at [facility]

## 2019-09-21 NOTE — Telephone Encounter (Signed)
Referral was corrected by Suzi Roots

## 2019-10-30 ENCOUNTER — Other Ambulatory Visit: Payer: Self-pay | Admitting: Internal Medicine

## 2019-11-01 ENCOUNTER — Other Ambulatory Visit: Payer: Self-pay | Admitting: Obstetrics

## 2019-11-01 DIAGNOSIS — E2839 Other primary ovarian failure: Secondary | ICD-10-CM

## 2019-11-01 LAB — HM MAMMOGRAPHY

## 2019-11-08 ENCOUNTER — Telehealth: Payer: Self-pay | Admitting: Internal Medicine

## 2019-11-08 NOTE — Telephone Encounter (Signed)
Handicap Placard form to be filled out- placed in dr's folder.  Call [phone] upon completion.

## 2019-11-10 NOTE — Telephone Encounter (Signed)
Placed in Dr Hernandez's folder 

## 2019-11-11 NOTE — Telephone Encounter (Signed)
Form ready for pick up and patient is aware 

## 2019-11-18 ENCOUNTER — Encounter: Payer: Self-pay | Admitting: Internal Medicine

## 2019-12-07 ENCOUNTER — Ambulatory Visit: Payer: Medicare PPO | Attending: Internal Medicine

## 2019-12-07 DIAGNOSIS — Z23 Encounter for immunization: Secondary | ICD-10-CM | POA: Insufficient documentation

## 2019-12-07 NOTE — Progress Notes (Signed)
   Covid-19 Vaccination Clinic  Name:  Teresa Martinez    MRN: [id number] DOB: 14-Mar-1941  12/07/2019  Teresa Martinez was observed post Covid-19 immunization for 15 minutes without incidence. She was provided with Vaccine Information Sheet and instruction to access the V-Safe system.   Teresa Martinez was instructed to call 911 with any severe reactions post vaccine: Marland Kitchen Difficulty breathing  . Swelling of your face and throat  . A fast heartbeat  . A bad rash all over your body  . Dizziness and weakness    Immunizations Administered    Name Date Dose VIS Date Route   Pfizer COVID-19 Vaccine 12/07/2019  9:21 AM 0.3 mL 10/29/2019 Intramuscular   Manufacturer: [employer], [employer]   Lot: [id number]   NDC: [address]-5

## 2019-12-26 ENCOUNTER — Ambulatory Visit: Payer: Medicare PPO | Attending: Internal Medicine

## 2019-12-26 DIAGNOSIS — Z23 Encounter for immunization: Secondary | ICD-10-CM | POA: Insufficient documentation

## 2019-12-26 NOTE — Progress Notes (Signed)
   Covid-19 Vaccination Clinic  Name:  Teresa Martinez    MRN: [id number] DOB: 1941/08/20  12/26/2019  [name] was observed post Covid-19 immunization for 15 minutes without incidence. She was provided with Vaccine Information Sheet and instruction to access the V-Safe system.   [name] was instructed to call 911 with any severe reactions post vaccine: Marland Kitchen Difficulty breathing  . Swelling of your face and throat  . A fast heartbeat  . A bad rash all over your body  . Dizziness and weakness    Immunizations Administered    Name Date Dose VIS Date Route   Pfizer COVID-19 Vaccine 12/26/2019  3:51 PM 0.3 mL 10/29/2019 Intramuscular   Manufacturer: [employer], [employer]   Lot: [id number]   NDC: [address]-2

## 2019-12-28 ENCOUNTER — Ambulatory Visit: Payer: Medicare PPO

## 2020-01-20 ENCOUNTER — Ambulatory Visit: Payer: Medicare Other | Admitting: Internal Medicine

## 2020-01-26 ENCOUNTER — Ambulatory Visit: Payer: Medicare Other | Admitting: Internal Medicine

## 2020-01-28 ENCOUNTER — Other Ambulatory Visit: Payer: Self-pay | Admitting: Internal Medicine

## 2020-01-28 DIAGNOSIS — I10 Essential (primary) hypertension: Secondary | ICD-10-CM

## 2020-01-31 ENCOUNTER — Other Ambulatory Visit: Payer: Self-pay

## 2020-01-31 ENCOUNTER — Ambulatory Visit
Admission: RE | Admit: 2020-01-31 | Discharge: 2020-01-31 | Disposition: A | Discharge status: Home / Self Care | Payer: Medicare PPO | Source: Ambulatory Visit | Attending: Obstetrics | Admitting: Obstetrics

## 2020-01-31 DIAGNOSIS — Z780 Asymptomatic menopausal state: Secondary | ICD-10-CM | POA: Diagnosis not present

## 2020-01-31 DIAGNOSIS — E2839 Other primary ovarian failure: Secondary | ICD-10-CM

## 2020-02-04 ENCOUNTER — Other Ambulatory Visit: Payer: Self-pay | Admitting: Internal Medicine

## 2020-02-22 ENCOUNTER — Other Ambulatory Visit: Payer: Self-pay

## 2020-02-23 ENCOUNTER — Ambulatory Visit (INDEPENDENT_AMBULATORY_CARE_PROVIDER_SITE_OTHER): Payer: Medicare PPO | Admitting: Internal Medicine

## 2020-02-23 ENCOUNTER — Encounter: Payer: Self-pay | Admitting: Internal Medicine

## 2020-02-23 VITALS — BP 130/80 | HR 83 | Temp 97.2°F | Wt 134.5 lb

## 2020-02-23 DIAGNOSIS — I6350 Cerebral infarction due to unspecified occlusion or stenosis of unspecified cerebral artery: Secondary | ICD-10-CM

## 2020-02-23 DIAGNOSIS — Z7982 Long term (current) use of aspirin: Secondary | ICD-10-CM | POA: Diagnosis not present

## 2020-02-23 DIAGNOSIS — Z8249 Family history of ischemic heart disease and other diseases of the circulatory system: Secondary | ICD-10-CM | POA: Diagnosis not present

## 2020-02-23 DIAGNOSIS — I639 Cerebral infarction, unspecified: Secondary | ICD-10-CM

## 2020-02-23 DIAGNOSIS — I10 Essential (primary) hypertension: Secondary | ICD-10-CM

## 2020-02-23 DIAGNOSIS — E7800 Pure hypercholesterolemia, unspecified: Secondary | ICD-10-CM | POA: Diagnosis not present

## 2020-02-23 DIAGNOSIS — Z811 Family history of alcohol abuse and dependence: Secondary | ICD-10-CM | POA: Diagnosis not present

## 2020-02-23 DIAGNOSIS — G8929 Other chronic pain: Secondary | ICD-10-CM | POA: Diagnosis not present

## 2020-02-23 DIAGNOSIS — Z823 Family history of stroke: Secondary | ICD-10-CM | POA: Diagnosis not present

## 2020-02-23 DIAGNOSIS — E785 Hyperlipidemia, unspecified: Secondary | ICD-10-CM | POA: Diagnosis not present

## 2020-02-23 DIAGNOSIS — N3281 Overactive bladder: Secondary | ICD-10-CM | POA: Diagnosis not present

## 2020-02-23 DIAGNOSIS — R32 Unspecified urinary incontinence: Secondary | ICD-10-CM | POA: Diagnosis not present

## 2020-02-23 LAB — LIPID PANEL
Cholesterol: 203 mg/dL — ABNORMAL HIGH (ref 0–200)
HDL: 45.8 mg/dL (ref >39.00)
LDL Cholesterol: 139 mg/dL — ABNORMAL HIGH (ref 0–99)
NonHDL: 157.58
Total CHOL/HDL Ratio: 4
Triglycerides: 93 mg/dL (ref 0.0–149.0)
VLDL: 18.6 mg/dL (ref 0.0–40.0)

## 2020-02-23 MED ORDER — FESOTERODINE FUMARATE ER 4 MG PO TB24: ORAL_TABLET | ORAL | 1 refills | Status: DC

## 2020-02-23 MED ORDER — LISINOPRIL 10 MG PO TABS: ORAL_TABLET | ORAL | 1 refills | Status: DC

## 2020-02-23 MED ORDER — EZETIMIBE 10 MG PO TABS: ORAL_TABLET | ORAL | 1 refills | Status: DC

## 2020-02-23 NOTE — Progress Notes (Signed)
Established Patient Office Visit     This visit occurred during the SARS-CoV-2 public health emergency.  Safety protocols were in place, including screening questions prior to the visit, additional usage of staff PPE, and extensive cleaning of exam room while observing appropriate contact time as indicated for disinfecting solutions.    CC/Reason for Visit: 95-month follow-up chronic conditions  HPI: Teresa Martinez is a 79 y.o. female who is coming in today for the above mentioned reasons. Past Medical History is significant for: Hyperlipidemia with statin intolerance on Zetia with last LDL of 193, prior left pontine CVA, history of hypertension that has been well controlled.  She is in remarkable health for her age.  She does both cardio and weight training on a daily basis.  She eats very healthy.  She has in fact lost 8 pounds since last visit in September after we discussed concerns about hyperlipidemia.  She tells me there is definitely a history of familial hyperlipidemia, mother died in her 47s because of a stroke, her sister, aunt and 2 of her daughters (who are also very thin) have hyperlipidemia.  She feels well and has no acute complaints today.   Past Medical/Surgical History: Past Medical History:  Diagnosis Date  . Cervical spine degeneration    C5/C6  . Chronic right shoulder pain   . Hemiparesis affecting dominant side as late effect of cerebrovascular accident ([place]) 12/07/2014  . Hyperlipemia   . Hypertension   . Menopausal syndrome    [name] Dotsero)  . Overactive bladder   . Statin intolerance     Past Surgical History:  Procedure Laterality Date  . ABDOMINAL HYSTERECTOMY     1985  . BAND HEMORRHOIDECTOMY     2010  . CHOLECYSTECTOMY     1950  . PAROTIDECTOMY     30 years ago    Social History:  reports that she has never smoked. She has never used smokeless tobacco. She reports that she does not drink alcohol or use  drugs.  Allergies: Allergies  Allergen Reactions  . Statins Other (See Comments)    Muscle pain and unable to walk Muscle pain and unable to walk  . Latex Other (See Comments)    Burns her skin    Family History:  Family History  Problem Relation Age of Onset  . Heart failure Father   . Diabetes Mother   . Hypertension Mother   . Stroke Mother   . Cirrhosis Sister   . Diabetes Sister   . Hyperlipidemia Brother   . Hypertension Brother   . Stroke Brother   . Diabetes Brother      Current Outpatient Medications:  .  aspirin EC 325 MG EC tablet, Take 1 tablet (325 mg total) by mouth daily., Disp: 100 tablet, Rfl: 1 .  conjugated estrogens (PREMARIN) vaginal cream, Place 0.5 g vaginally., Disp: , Rfl:  .  diazepam (VALIUM) 2 MG tablet, Take 1 tablet (2 mg total) by mouth every 6 (six) hours as needed for anxiety., Disp: 6 tablet, Rfl: 0 .  ezetimibe (ZETIA) 10 MG tablet, TAKE 1 TABLET(10 MG) BY MOUTH DAILY, Disp: 90 tablet, Rfl: 1 .  fesoterodine (TOVIAZ) 4 MG TB24 tablet, TAKE 1 TABLET(4 MG) BY MOUTH DAILY, Disp: 90 tablet, Rfl: 1 .  lisinopril (ZESTRIL) 10 MG tablet, TAKE 1 TABLET(10 MG) BY MOUTH DAILY, Disp: 90 tablet, Rfl: 1  Review of Systems:  Constitutional: Denies fever, chills, diaphoresis, appetite change and fatigue.  HEENT: Denies  photophobia, eye pain, redness, hearing loss, ear pain, congestion, sore throat, rhinorrhea, sneezing, mouth sores, trouble swallowing, neck pain, neck stiffness and tinnitus.   Respiratory: Denies SOB, DOE, cough, chest tightness,  and wheezing.   Cardiovascular: Denies chest pain, palpitations and leg swelling.  Gastrointestinal: Denies nausea, vomiting, abdominal pain, diarrhea, constipation, blood in stool and abdominal distention.  Genitourinary: Denies dysuria, urgency, frequency, hematuria, flank pain and difficulty urinating.  Endocrine: Denies: hot or cold intolerance, sweats, changes in hair or nails, polyuria,  polydipsia. Musculoskeletal: Denies myalgias, back pain, joint swelling, arthralgias and gait problem.  Skin: Denies pallor, rash and wound.  Neurological: Denies dizziness, seizures, syncope, weakness, light-headedness, numbness and headaches.  Hematological: Denies adenopathy. Easy bruising, personal or family bleeding history  Psychiatric/Behavioral: Denies suicidal ideation, mood changes, confusion, nervousness, sleep disturbance and agitation    Physical Exam: Vitals:   02/23/20 0930  BP: 130/80  Pulse: 83  Temp: (!) 97.2 F (36.2 C)  TempSrc: Temporal  SpO2: 98%  Weight: 134 lb 8 oz (61 kg)    Body mass index is 21.71 kg/m.   Constitutional: NAD, calm, comfortable Eyes: PERRL, lids and conjunctivae normal ENMT: Mucous membranes are moist.  Respiratory: clear to auscultation bilaterally, no wheezing, no crackles. Normal respiratory effort. No accessory muscle use.  Cardiovascular: Regular rate and rhythm, no murmurs / rubs / gallops. No extremity edema. Neurologic: Grossly intact and nonfocal Psychiatric: Normal judgment and insight. Alert and oriented x 3. Normal mood.    Impression and Plan:  Pure hypercholesterolemia  -Last LDL was 193 in September 2020. -She has severe statin myalgias, has been on Zetia 10 mg. -Despite her advanced age, she is in remarkable health and leads a very healthy lifestyle.  I think it is worth it to consider an alternative medication for hyperlipidemia given her prior history of CVA and her family history puts her in a high risk category. -I will refer her to the advanced lipid disorder clinic today. -Recheck lipids today.  Essential hypertension -Well-controlled on lisinopril.  Left pontine stroke ([facility]CC) -History, no residual deficits.   Patient Instructions  -Nice seeing you today!!  -Lab work today; will notify you once results are available.  -Will place referral for the cholesterol clinic today.  -Schedule follow up in 6  months for your annual physical.       Chaya Jan, MD [facility] at [facility]

## 2020-02-23 NOTE — Patient Instructions (Signed)
-  Nice seeing you today!!  -Lab work today; will notify you once results are available.  -Will place referral for the cholesterol clinic today.  -Schedule follow up in 6 months for your annual physical.

## 2020-03-22 ENCOUNTER — Encounter: Payer: Self-pay | Admitting: Physician Assistant

## 2020-03-22 ENCOUNTER — Other Ambulatory Visit: Payer: Self-pay

## 2020-03-22 ENCOUNTER — Ambulatory Visit: Payer: Medicare PPO | Admitting: Physician Assistant

## 2020-03-22 ENCOUNTER — Ambulatory Visit (INDEPENDENT_AMBULATORY_CARE_PROVIDER_SITE_OTHER): Payer: Medicare PPO

## 2020-03-22 DIAGNOSIS — M25562 Pain in left knee: Secondary | ICD-10-CM | POA: Diagnosis not present

## 2020-03-22 MED ORDER — LIDOCAINE HCL 1 % IJ SOLN
3.0000 mL | INTRAMUSCULAR | Status: AC | PRN
  Administered 2020-03-22: 3 mL

## 2020-03-22 MED ORDER — METHYLPREDNISOLONE ACETATE 40 MG/ML IJ SUSP
40.0000 mg | INTRAMUSCULAR | Status: AC | PRN
  Administered 2020-03-22: 40 mg via INTRA_ARTICULAR

## 2020-03-22 NOTE — Progress Notes (Addendum)
Office Visit Note   Patient: Teresa Martinez           Date of Birth: Nov 20, 1940           MRN: [id number] Visit Date: 03/22/2020              Requested by: Philip Aspen, Limmie Patricia, MD [address] [place],  [place] [address] PCP: Philip Aspen, Limmie Patricia, MD   Assessment & Plan: Visit Diagnoses:  1. Acute pain of left knee     Plan: We will have her follow-up with [place] in 2 weeks see what type of response she had to the left knee aspiration and injection.  She will work on Dance movement psychotherapist.  She will slowly resume her activities as tolerated.  If she continues to have pain or effusion mechanical symptoms of the knee will order an MRI to rule out meniscal tear.  Questions were encouraged and answered both patient and her daughters present throughout the exam.  Follow-Up Instructions: Return in about 2 weeks (around 04/05/2020).   Orders:  Orders Placed This Encounter  Procedures  . Large Joint Inj  . XR Knee 1-2 Views Left   No orders of the defined types were placed in this encounter.     Procedures: Large Joint Inj: L knee on 03/22/2020 4:44 PM Indications: pain Details: 22 G 1.5 in needle, anterolateral approach  Arthrogram: No  Medications: 3 mL lidocaine 1 %; 40 mg methylPREDNISolone acetate 40 MG/ML Aspirate: 11 mL blood-tinged Outcome: tolerated well, no immediate complications Procedure, treatment alternatives, risks and benefits explained, specific risks discussed. Consent was given by the patient. Immediately prior to procedure a time out was called to verify the correct patient, procedure, equipment, support staff and site/side marked as required. Patient was prepped and draped in the usual sterile fashion.       Clinical Data: No additional findings.   Subjective: Chief Complaint  Patient presents with  . Left Knee - Pain    HPI Teresa Martinez is an 79 year old female with time for an acute onset of left knee pain.  She  states this past Sunday she was walking her knee gave out.  She had to crawl back due to the pain in the knee.  She had swelling in the knee.  Most pain in the medial aspect of the knee.  Pain is worse with weightbearing.  She is ambulating with a cane which she is not doing prior to this injury.  Been taking Advil.  She is nondiabetic.  Second Covid vaccine injection greater than 2 weeks ago Review of Systems No fevers chills shortness of breath.  Objective: Vital Signs: There were no vitals taken for this visit.  Physical Exam Constitutional:      Appearance: She is not ill-appearing or diaphoretic.  Pulmonary:     Effort: Pulmonary effort is normal.  Neurological:     Mental Status: She is alert and oriented to person, place, and time.  Psychiatric:        Behavior: Behavior normal.     Ortho Exam Left knee lacks full extension but approximately 10 to 15 degrees.  Tenderness along medial joint line.  Unable to perform McMurray secondary to patient's guarding.  No instability valgus varus stressing.  No abnormal warmth erythema or ecchymosis of the left knee.  Positive effusion. Specialty Comments:  No specialty comments available.  Imaging: XR Knee 1-2 Views Left  Result Date: 03/22/2020 Left knee 2 views: Knee is well located.  Moderate narrowing medial joint line.  Mild to moderate patellofemoral changes.  Lateral compartment well preserved.  No acute fractures.  No bony abnormalities otherwise.    PMFS History: Patient Active Problem List   Diagnosis Date Noted  . Preop testing 07/09/2018  . Degenerative lumbar spinal stenosis 05/12/2018  . Statin myopathy 08/29/2016  . Iliotibial band syndrome of right side 04/23/2016  . Acromioclavicular arthrosis 06/30/2015  . Left carpal tunnel syndrome 05/15/2015  . Adhesive capsulitis of right shoulder 02/06/2015  . Dysarthria due to cerebrovascular accident 01/09/2015  . Hemiparesis affecting dominant side as late effect of  cerebrovascular accident ([place]) 12/07/2014  . Spastic neurogenic bladder 10/31/2014  . Right rotator cuff tendonitis 10/19/2014  . Stenosis of cervical spine region   . Chronic right shoulder pain   . Cerebral infarction due to thrombosis of left middle cerebral artery ([place])   . Left pontine stroke ([place])   . TIA (transient ischemic attack) 10/14/2014  . Overactive bladder 10/14/2014  . H/O: CVA (cerebrovascular accident) 10/14/2014  . NECK PAIN, CHRONIC 10/29/2010  . Essential hypertension 10/10/2009  . INSOMNIA 09/15/2009  . Pure hypercholesterolemia 11/28/2008  . MENOPAUSAL SYNDROME 11/28/2008   Past Medical History:  Diagnosis Date  . Cervical spine degeneration    C5/C6  . Chronic right shoulder pain   . Hemiparesis affecting dominant side as late effect of cerebrovascular accident ([place]) 12/07/2014  . Hyperlipemia   . Hypertension   . Menopausal syndrome    [name] [place])  . Overactive bladder   . Statin intolerance     Family History  Problem Relation Age of Onset  . Heart failure Father   . Diabetes Mother   . Hypertension Mother   . Stroke Mother   . Cirrhosis Sister   . Diabetes Sister   . Hyperlipidemia Brother   . Hypertension Brother   . Stroke Brother   . Diabetes Brother     Past Surgical History:  Procedure Laterality Date  . ABDOMINAL HYSTERECTOMY     1985  . BAND HEMORRHOIDECTOMY     2010  . CHOLECYSTECTOMY     1950  . PAROTIDECTOMY     30 years ago   Social History   Occupational History  . Occupation: retired  Tobacco Use  . Smoking status: Never Smoker  . Smokeless tobacco: Never Used  Substance and Sexual Activity  . Alcohol use: No  . Drug use: No  . Sexual activity: Not on file

## 2020-04-05 ENCOUNTER — Encounter: Payer: Self-pay | Admitting: Physician Assistant

## 2020-04-05 ENCOUNTER — Other Ambulatory Visit: Payer: Self-pay

## 2020-04-05 ENCOUNTER — Ambulatory Visit: Payer: Medicare PPO | Admitting: Physician Assistant

## 2020-04-05 DIAGNOSIS — M25562 Pain in left knee: Secondary | ICD-10-CM

## 2020-04-05 MED ORDER — DIAZEPAM 5 MG PO TABS: ORAL_TABLET | ORAL | 0 refills | Status: DC

## 2020-04-05 NOTE — Addendum Note (Signed)
Addended by: Mardene Celeste B on: 04/05/2020 04:30 PM   Modules accepted: Orders

## 2020-04-05 NOTE — Progress Notes (Signed)
HPI: Teresa Martinez returns today follow-up of her left knee status post aspiration injection.  She states she is 50% better ambulates with a cane.  Still having pain in the knee and swelling.  No true mechanical symptoms.  Review of systems: Please see HPI  Physical exam: Left knee near full extension lacking the last 5 degrees.  Full flexion.  Positive McMurray's.  Positive recurrent effusion.  Tenderness along medial joint line.  No instability valgus varus stressing.  Ambulates with cane with slight antalgic gait.  Impression: Left knee acute pain with recurrent effusion  Plan: Given patient's continued pain and recurrent effusion we will obtain an MRI of the knee to rule out acute meniscal tear.  Have her follow-up after the MRI to go over results discuss further treatment.  Questions and encouraged and answered.  She is claustrophobic and therefore Valium was given for her to take prior to the MRI.

## 2020-04-05 NOTE — Addendum Note (Signed)
Addended by: Richardean Canal on: 04/05/2020 03:01 PM   Modules accepted: Orders

## 2020-05-06 ENCOUNTER — Other Ambulatory Visit: Payer: Self-pay

## 2020-05-06 ENCOUNTER — Ambulatory Visit
Admission: RE | Admit: 2020-05-06 | Discharge: 2020-05-06 | Disposition: A | Discharge status: Home / Self Care | Payer: Medicare PPO | Source: Ambulatory Visit | Attending: Physician Assistant | Admitting: Physician Assistant

## 2020-05-06 DIAGNOSIS — M25562 Pain in left knee: Secondary | ICD-10-CM

## 2020-05-08 ENCOUNTER — Other Ambulatory Visit: Payer: Self-pay

## 2020-05-08 ENCOUNTER — Ambulatory Visit: Payer: Medicare PPO | Admitting: Physician Assistant

## 2020-05-08 ENCOUNTER — Encounter: Payer: Self-pay | Admitting: Physician Assistant

## 2020-05-08 DIAGNOSIS — S838X2S Sprain of other specified parts of left knee, sequela: Secondary | ICD-10-CM

## 2020-05-08 DIAGNOSIS — S838X2D Sprain of other specified parts of left knee, subsequent encounter: Secondary | ICD-10-CM

## 2020-05-08 NOTE — Progress Notes (Signed)
Office Visit Note   Patient: Teresa Martinez           Date of Birth: [birth year]           MRN: [id number] Visit Date: 05/08/2020              Requested by: Philip Aspen, Limmie Patricia, MD [address] [place],  [place] [address] PCP: Philip Aspen, Limmie Patricia, MD   Assessment & Plan: Visit Diagnoses:  1. Acute medial meniscal injury of knee, left, sequela     Plan: Due to patient's continued pain in the recurrent effusion and given MRI findings recommend left knee arthroscopy.  Risk benefits discussed with patient at length by Dr. Magnus Ivan myself.  Did discuss with her that she may have continued pain in the knee despite undergoing knee arthroscopy due to the fact that she has significant medial compartmental arthritic changes.  See her back 1 week postop.  Follow-Up Instructions: Return 1 week post op.   Orders:  No orders of the defined types were placed in this encounter.  No orders of the defined types were placed in this encounter.     Procedures: No procedures performed   Clinical Data: No additional findings.   Subjective: Chief Complaint  Patient presents with  . Left Knee - Follow-up    HPI  Teresa Martinez returns today to go over the left knee.  She continues to have pain in the left knee.  She states that she is straighten the knee completely most her pain is in the posterior aspect of the knee.  MRI of the knee is reviewed with her actual images are reviewed with the patient.  MRI left knee showed radial tear of the root of the posterior horn of the meniscus with meniscal extrusion.  Full-thickness cartilage loss involving the central portion of the medial tibial plateau and medial femoral condyle.  Moderate joint effusion.  Slight articular thinning in the patellofemoral joint.   History of TIA on no anticoagulation.  Review of Systems See HPI.  Objective: Vital Signs: There were no vitals taken for this visit.  Physical  Exam Constitutional:      Appearance: She is not ill-appearing or diaphoretic.  Neurological:     Mental Status: She is alert and oriented to person, place, and time.  Psychiatric:        Mood and Affect: Mood normal.     Ortho Exam  Left knee slight effusion no abnormal warmth or erythema.  Tenderness posterior medial aspect knee.  Full range of motion.  No instability valgus varus stressing. Specialty Comments:  No specialty comments available.  Imaging: No results found.   PMFS History: Patient Active Problem List   Diagnosis Date Noted  . Preop testing 07/09/2018  . Degenerative lumbar spinal stenosis 05/12/2018  . Statin myopathy 08/29/2016  . Iliotibial band syndrome of right side 04/23/2016  . Acromioclavicular arthrosis 06/30/2015  . Left carpal tunnel syndrome 05/15/2015  . Adhesive capsulitis of right shoulder 02/06/2015  . Dysarthria due to cerebrovascular accident 01/09/2015  . Hemiparesis affecting dominant side as late effect of cerebrovascular accident ([facility]) 12/07/2014  . Spastic neurogenic bladder 10/31/2014  . Right rotator cuff tendonitis 10/19/2014  . Stenosis of cervical spine region   . Chronic right shoulder pain   . Cerebral infarction due to thrombosis of left middle cerebral artery ([facility])   . Left pontine stroke ([facility])   . TIA (transient ischemic attack) 10/14/2014  . Overactive bladder 10/14/2014  .  H/O: CVA (cerebrovascular accident) 10/14/2014  . NECK PAIN, CHRONIC 10/29/2010  . Essential hypertension 10/10/2009  . INSOMNIA 09/15/2009  . Pure hypercholesterolemia 11/28/2008  . MENOPAUSAL SYNDROME 11/28/2008   Past Medical History:  Diagnosis Date  . Cervical spine degeneration    C5/C6  . Chronic right shoulder pain   . Hemiparesis affecting dominant side as late effect of cerebrovascular accident ([place]) 12/07/2014  . Hyperlipemia   . Hypertension   . Menopausal syndrome    [name] [place])  . Overactive bladder   . Statin  intolerance     Family History  Problem Relation Age of Onset  . Heart failure Father   . Diabetes Mother   . Hypertension Mother   . Stroke Mother   . Cirrhosis Sister   . Diabetes Sister   . Hyperlipidemia Brother   . Hypertension Brother   . Stroke Brother   . Diabetes Brother     Past Surgical History:  Procedure Laterality Date  . ABDOMINAL HYSTERECTOMY     1985  . BAND HEMORRHOIDECTOMY     2010  . CHOLECYSTECTOMY     1950  . PAROTIDECTOMY     30 years ago   Social History   Occupational History  . Occupation: retired  Tobacco Use  . Smoking status: Never Smoker  . Smokeless tobacco: Never Used  Substance and Sexual Activity  . Alcohol use: No  . Drug use: No  . Sexual activity: Not on file

## 2020-05-15 ENCOUNTER — Other Ambulatory Visit: Payer: Self-pay

## 2020-05-25 ENCOUNTER — Encounter: Payer: Self-pay | Admitting: Orthopaedic Surgery

## 2020-05-25 ENCOUNTER — Other Ambulatory Visit: Payer: Self-pay | Admitting: Orthopaedic Surgery

## 2020-05-25 DIAGNOSIS — M94262 Chondromalacia, left knee: Secondary | ICD-10-CM | POA: Diagnosis not present

## 2020-05-25 DIAGNOSIS — Z96652 Presence of left artificial knee joint: Secondary | ICD-10-CM | POA: Diagnosis not present

## 2020-05-25 DIAGNOSIS — M948X6 Other specified disorders of cartilage, lower leg: Secondary | ICD-10-CM | POA: Diagnosis not present

## 2020-05-25 DIAGNOSIS — S83232D Complex tear of medial meniscus, current injury, left knee, subsequent encounter: Secondary | ICD-10-CM | POA: Diagnosis not present

## 2020-05-25 DIAGNOSIS — S83232A Complex tear of medial meniscus, current injury, left knee, initial encounter: Secondary | ICD-10-CM | POA: Diagnosis not present

## 2020-05-25 DIAGNOSIS — G8918 Other acute postprocedural pain: Secondary | ICD-10-CM | POA: Diagnosis not present

## 2020-05-25 DIAGNOSIS — M794 Hypertrophy of (infrapatellar) fat pad: Secondary | ICD-10-CM | POA: Diagnosis not present

## 2020-05-25 MED ORDER — HYDROCODONE-ACETAMINOPHEN 5-325 MG PO TABS: 1.0000 | ORAL_TABLET | Freq: Four times a day (QID) | ORAL | 0 refills | Status: DC | PRN

## 2020-05-30 ENCOUNTER — Telehealth (HOSPITAL_COMMUNITY): Payer: Self-pay

## 2020-05-30 NOTE — Telephone Encounter (Signed)
She should see her PCP about the blood pressure. Can you call her?

## 2020-05-30 NOTE — Telephone Encounter (Signed)
Please advise 

## 2020-05-30 NOTE — Telephone Encounter (Signed)
Patient called in to inform that her blood pressure went up and that she has been having issues . Says she wanted to speak about is there any futher steps for her.

## 2020-05-31 ENCOUNTER — Telehealth: Payer: Self-pay | Admitting: Internal Medicine

## 2020-05-31 NOTE — Telephone Encounter (Signed)
Pt had knee surgery on 05/25/20 and yesterday morning around 9:30a BP was 103/64 and it went back up around 12noon 126/74 and stayed normal all day and when taken around 9 it was 132/72. Pt just wanted to make Dr. Ardyth Harps aware and see if she need to do anything. Pt states this happen before when she had a another surgery. Please advise.

## 2020-05-31 NOTE — Telephone Encounter (Signed)
All of these BPs are normal. Thanks for the update.

## 2020-05-31 NOTE — Telephone Encounter (Signed)
Tried calling patient a couple times and a message "We are sorry your call can not be completed at this time" automatically comes up?

## 2020-05-31 NOTE — Telephone Encounter (Signed)
Patient aware to call her PCP, she just wanted to let us know

## 2020-06-01 ENCOUNTER — Other Ambulatory Visit: Payer: Self-pay

## 2020-06-01 ENCOUNTER — Ambulatory Visit (INDEPENDENT_AMBULATORY_CARE_PROVIDER_SITE_OTHER): Payer: Medicare PPO | Admitting: Orthopaedic Surgery

## 2020-06-01 ENCOUNTER — Encounter: Payer: Self-pay | Admitting: Orthopaedic Surgery

## 2020-06-01 ENCOUNTER — Telehealth: Payer: Self-pay

## 2020-06-01 DIAGNOSIS — Z9889 Other specified postprocedural states: Secondary | ICD-10-CM

## 2020-06-01 NOTE — Telephone Encounter (Signed)
Left knee gel injection ?

## 2020-06-01 NOTE — Progress Notes (Signed)
The patient is 1 week status post a left knee arthroscopy.  We found a significant medial meniscal tear but we also found areas of full-thickness cartilage loss over wire the medial femoral condyle and the medial tibial plateau.  She is a very active 79 year old female.  On exam I was able to aspirate at least 40 cc of fluid from the knee consistent with postoperative state.  I placed a steroid injection in the knee.  I showed her her arthroscopy pictures.  She is a perfect candidate to try hyaluronic acid for this knee.  At some point she may need a knee replacement but she would rather try this route first and I agree.  She is also requesting outpatient physical therapy to just work with her general mobility with strengthening of her bilateral lower extremities as well as upper extremities and conditioning.  We will work on setting that up as an outpatient.  All questions and concerns were answered and addressed.  I will see her back in 4 weeks.

## 2020-06-01 NOTE — Telephone Encounter (Signed)
Left message with female for patient to return our call.

## 2020-06-02 NOTE — Telephone Encounter (Signed)
Patient is aware 

## 2020-06-05 NOTE — Telephone Encounter (Signed)
Noted  

## 2020-06-06 NOTE — Telephone Encounter (Signed)
Pt record her bp for the past few days   138/77   7/15 145/86   7/16 136/82   7/17 134/80   7/18 136/79   7/18 135/75   7/18 122/86   7/19 128/82   7/19 121/80   7/20  Pt can be reached at [phone]

## 2020-06-09 ENCOUNTER — Telehealth: Payer: Self-pay

## 2020-06-09 NOTE — Telephone Encounter (Signed)
Submitted VOB, Monovisc, left Knee.

## 2020-06-13 ENCOUNTER — Other Ambulatory Visit: Payer: Self-pay

## 2020-06-13 ENCOUNTER — Ambulatory Visit (INDEPENDENT_AMBULATORY_CARE_PROVIDER_SITE_OTHER): Payer: Medicare PPO | Admitting: Physical Therapy

## 2020-06-13 ENCOUNTER — Encounter: Payer: Self-pay | Admitting: Physical Therapy

## 2020-06-13 DIAGNOSIS — M6281 Muscle weakness (generalized): Secondary | ICD-10-CM | POA: Diagnosis not present

## 2020-06-13 DIAGNOSIS — R600 Localized edema: Secondary | ICD-10-CM | POA: Diagnosis not present

## 2020-06-13 DIAGNOSIS — M25662 Stiffness of left knee, not elsewhere classified: Secondary | ICD-10-CM

## 2020-06-13 DIAGNOSIS — M25562 Pain in left knee: Secondary | ICD-10-CM | POA: Diagnosis not present

## 2020-06-13 NOTE — Patient Instructions (Signed)
Access Code: [email or web address] URL: [email or web address] Date: 06/13/2020 Prepared by: Ivery Quale  Exercises Gastroc Stretch on Wall - 1 x daily - 3 x weekly - 1 sets - 3 reps - 30 hold Seated Hamstring Stretch - 2 x daily - 6 x weekly - 3 reps - 1 sets - 30 hold Sit to Stand without Arm Support - 2 x daily - 6 x weekly - 3-4 sets - 5 reps Step Up - 2 x daily - 6 x weekly - 10 reps - 1-2 sets Standing Single Leg Stance with Counter Support - 2 x daily - 6 x weekly - 1 sets - 4-5 reps - 10 sec hold

## 2020-06-13 NOTE — Therapy (Signed)
[facility] [address] [place], [place], [address] Phone: [phone]   Fax:  [phone]  Physical Therapy Evaluation  Patient Details  Name: Teresa Martinez MRN: [id number] Date of Birth: 1940-12-11 Referring Provider (PT): Magnus Ivan   Encounter Date: 06/13/2020   PT End of Session - 06/13/20 1108    Visit Number 1    Number of Visits 12    Date for PT Re-Evaluation 07/25/20    Authorization Type [employer]    PT Start Time 1015    PT Stop Time 1055    PT Time Calculation (min) 40 min    Activity Tolerance Patient tolerated treatment well    Behavior During Therapy [facility] for tasks assessed/performed           Past Medical History:  Diagnosis Date   Cervical spine degeneration    C5/C6   Chronic right shoulder pain    Hemiparesis affecting dominant side as late effect of cerebrovascular accident (HCC) 12/07/2014   Hyperlipemia    Hypertension    Menopausal syndrome    [place] Parkins)   Overactive bladder    Statin intolerance     Past Surgical History:  Procedure Laterality Date   ABDOMINAL HYSTERECTOMY     1985   BAND HEMORRHOIDECTOMY     2010   CHOLECYSTECTOMY     1950   PAROTIDECTOMY     30 years ago    There were no vitals filed for this visit.    Subjective Assessment - 06/13/20 1021    Subjective This 79yo female was referred to PT on 06/01/2020 s/p left knee arthroscopy by Doneen Poisson, MD. Scope found significant medial meniscal tear & full-thickness cartilage loss between medial femoral condyle & tibial plateau.  On 06/01/2020 she had 40cc fluid aspirated & steriod injection. She relays she has been using SPC off and on since her back surgery but then was not using it until she hurt her knee again. She says she feels she needs to work on her overall strength and conditioning.    How long can you stand comfortably? less than 10 minutes    How long can you walk comfortably? depends     Currently in Pain? Yes    Pain Score --   no pain at rest, pain is 3 when standing   Pain Location Knee    Pain Orientation Left    Pain Descriptors / Indicators Aching    Pain Type Surgical pain    Pain Radiating Towards denies    Pain Onset 1 to 4 weeks ago    Pain Frequency Intermittent    Aggravating Factors  standing activity    Pain Relieving Factors rest    Multiple Pain Sites No              [facility] PT Assessment - 06/13/20 0001      Assessment   Medical Diagnosis s/p left knee arthroscopy    Referring Provider (PT) Magnus Ivan    Onset Date/Surgical Date 05/25/20    Next MD Visit 06/29/20    Prior Therapy [facility]      Precautions   Precautions None      Balance Screen   Has the patient fallen in the past 6 months No    Has the patient had a decrease in activity level because of a fear of falling?  No    Is the patient reluctant to leave their home because of a fear of falling?  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   Living Environment Private residence      Prior Function   Level of Independence Independent    Vocation Retired    Leisure run, [employer], read      CopyCognition   Overall Cognitive Status Within Functional Limits for tasks assessed      Observation/Other Assessments   Observations mild-mod edema present in Lt knee, incision sites well healed with no signs of infection      Sensation   Light Touch Appears Intact      Functional Tests   Functional tests Single leg stance      Single Leg Stance   Comments 3 second avg on Lt LE      ROM / Strength   AROM / PROM / Strength AROM;PROM;Strength      AROM   AROM Assessment Site Knee    Right/Left Knee Right;Left    Right Knee Extension 5    Right Knee Flexion 135    Left Knee Extension 10    Left Knee Flexion 135      PROM   PROM Assessment Site Knee    Right/Left Knee Left    Left Knee Extension 6      Strength   Overall Strength Comments has mild hemiparesis on Rt side due to CVA    Strength  Assessment Site Knee;Hip    Right/Left Hip Right;Left    Right Hip Flexion 3+/5    Left Hip Flexion 4-/5    Right/Left Knee Right;Left    Right Knee Flexion 4/5    Right Knee Extension 4+/5    Left Knee Flexion 4+/5    Left Knee Extension 4/5      Flexibility   Soft Tissue Assessment /Muscle Length --   very tight hamstrings bilat     Transfers   Transfers Independent with all Transfers      Ambulation/Gait   Ambulation/Gait Yes    Gait Comments can ambulate short distance without AD, but uses SPC for community, step to pattern with decreaesd weight shift to Rt LE      Standardized Balance Assessment   Standardized Balance Assessment Five Times Sit to Stand    Five times sit to stand comments  16 seconds   one LOB posteriorly into chair                     Objective measurements completed on examination: See above findings.       [facility] Adult PT Treatment/Exercise - 06/13/20 0001      Exercises   Exercises Knee/Hip      Knee/Hip Exercises: Stretches   Passive Hamstring Stretch 3 reps;30 seconds;Left      Knee/Hip Exercises: Aerobic   Recumbent Bike 6 min L3      Knee/Hip Exercises: Machines for Strengthening   Cybex Leg Press 75 lbs bilat push 2X15      Manual Therapy   Manual therapy comments Lt knee PROM, knee extenson mobs, passive HS stretching                  PT Education - 06/13/20 1107    Education Details HEP, POC    Person(s) Educated Patient    Methods Explanation;Demonstration;Verbal cues;Handout    Comprehension Verbalized understanding;Need further instruction               PT Long Term Goals - 06/13/20 1133      PT LONG TERM GOAL #1   Title Pt  will be I and compliant with HEP. (target for all goals 6 weeks 07/25/20)    Time 6    Period Weeks    Status New    Target Date 07/25/20      PT LONG TERM GOAL #2   Title She will increase Lt knee extension AROM to 3 deg or less from being straight.    Status New       PT LONG TERM GOAL #3   Title Pt will improve Lt hip strength to overall 4+/5 and Lt knee strength to 5/5 to improve function.    Status New      PT LONG TERM GOAL #4   Title Pt will be able to ambulate community distances at least 1000 ft on even and uneven surfaces and negotiate stairs without complaints.    Status New      PT LONG TERM GOAL #5   Title Pt will improve 5TSTS test to 13 seconds or less to show improved balance and endurance    Baseline 16 sec    Status New                  Plan - 06/13/20 1115    Clinical Impression Statement Teresa Martinez presents with Lt knee pain, stiffness, weakness and difficulty with gait/balance S/P knee scope 05/25/20. Overall she is very active should do very well with PT. Skilled PT indicated to address her impairments and safely progress toward her functional goals.    Personal Factors and Comorbidities Comorbidity 3+    Comorbidities DDD lumbar stenosis, ITB syndrome right side, rt shoulder adhesive capsulitis, CVA rt Hemiparesis, HTN, L3-5 laminectomy 2019,    Examination-Activity Limitations Bend;Carry;Lift;Stand;Stairs;Squat;Locomotion Level    Examination-Participation Restrictions Cleaning;Shop;Laundry;Yard Work    Conservation officer, historic buildings Stable/Uncomplicated    Optometrist Low    Rehab Potential Good    PT Frequency 2x / week   1-2   PT Duration 6 weeks   4-6 weeks   PT Treatment/Interventions ADLs/Self Care Home Management;Cryotherapy;Electrical Stimulation;Iontophoresis 4mg /ml Dexamethasone;Moist Heat;Ultrasound;Gait training;Stair training;Functional mobility training;Therapeutic activities;Therapeutic exercise;Balance training;Neuromuscular re-education;Manual techniques;Passive range of motion;Dry needling;Joint Manipulations;Vasopneumatic Device;Taping    PT Next Visit Plan review and update HEP PRN, use resistance equipment needs and add functional strength and balance as able, needs knee ext ROM    PT Home  Exercise Plan Access Code: [email or web address]    Consulted and Agree with Plan of Care Patient           Patient will benefit from skilled therapeutic intervention in order to improve the following deficits and impairments:  Abnormal gait, Decreased activity tolerance, Decreased balance, Decreased mobility, Decreased endurance, Decreased range of motion, Decreased strength, Increased edema, Difficulty walking, Increased fascial restricitons, Impaired flexibility, Pain  Visit Diagnosis: Acute pain of left knee  Stiffness of left knee, not elsewhere classified  Muscle weakness (generalized)  Localized edema     Problem List Patient Active Problem List   Diagnosis Date Noted   Preop testing 07/09/2018   Degenerative lumbar spinal stenosis 05/12/2018   Statin myopathy 08/29/2016   Iliotibial band syndrome of right side 04/23/2016   Acromioclavicular arthrosis 06/30/2015   Left carpal tunnel syndrome 05/15/2015   Adhesive capsulitis of right shoulder 02/06/2015   Dysarthria due to cerebrovascular accident 01/09/2015   Hemiparesis affecting dominant side as late effect of cerebrovascular accident (HCC) 12/07/2014   Spastic neurogenic bladder 10/31/2014   Right rotator cuff tendonitis 10/19/2014   Stenosis of cervical spine region    Chronic  right shoulder pain    Cerebral infarction due to thrombosis of left middle cerebral artery (HCC)    Left pontine stroke (HCC)    TIA (transient ischemic attack) 10/14/2014   Overactive bladder 10/14/2014   H/O: CVA (cerebrovascular accident) 10/14/2014   NECK PAIN, CHRONIC 10/29/2010   Essential hypertension 10/10/2009   INSOMNIA 09/15/2009   Pure hypercholesterolemia 11/28/2008   MENOPAUSAL SYNDROME 11/28/2008    Birdie Riddle 06/13/2020, 11:40 AM  [facility] Physical Therapy [address] [place], [place], [address] Phone: [phone]   Fax:  [phone]  Name: Teresa Salvas  Martinez MRN: [id number] Date of Birth: 28-Jan-1941

## 2020-06-14 ENCOUNTER — Telehealth: Payer: Self-pay

## 2020-06-14 NOTE — Telephone Encounter (Signed)
Approved, Monovisc, left knee. [place] Once OOP has been met, patient will be covered at 100% Co-pay of $40.00 PA required PA Approval# [id number]  Valid 06/14/2020-12/15/2020

## 2020-06-28 ENCOUNTER — Other Ambulatory Visit: Payer: Self-pay

## 2020-06-28 ENCOUNTER — Ambulatory Visit (INDEPENDENT_AMBULATORY_CARE_PROVIDER_SITE_OTHER): Payer: Medicare PPO | Admitting: Physical Therapy

## 2020-06-28 DIAGNOSIS — M6281 Muscle weakness (generalized): Secondary | ICD-10-CM | POA: Diagnosis not present

## 2020-06-28 DIAGNOSIS — M25562 Pain in left knee: Secondary | ICD-10-CM | POA: Diagnosis not present

## 2020-06-28 DIAGNOSIS — M25662 Stiffness of left knee, not elsewhere classified: Secondary | ICD-10-CM

## 2020-06-28 DIAGNOSIS — R600 Localized edema: Secondary | ICD-10-CM | POA: Diagnosis not present

## 2020-06-28 NOTE — Therapy (Signed)
[facility] [address] [place], [place], [address] Phone: [phone]   Fax:  [phone]  Physical Therapy Treatment  Patient Details  Name: Teresa Martinez MRN: [id number] Date of Birth: 1941/10/29 Referring Provider (PT): Magnus Ivan   Encounter Date: 06/28/2020   PT End of Session - 06/28/20 1018    Visit Number 2    Number of Visits 12    Date for PT Re-Evaluation 07/25/20    Authorization Type [employer]    PT Start Time 0930    PT Stop Time 1010    PT Time Calculation (min) 40 min    Activity Tolerance Patient tolerated treatment well    Behavior During Therapy [facility] for tasks assessed/performed           Past Medical History:  Diagnosis Date  . Cervical spine degeneration    C5/C6  . Chronic right shoulder pain   . Hemiparesis affecting dominant side as late effect of cerebrovascular accident (HCC) 12/07/2014  . Hyperlipemia   . Hypertension   . Menopausal syndrome    [name])  . Overactive bladder   . Statin intolerance     Past Surgical History:  Procedure Laterality Date  . ABDOMINAL HYSTERECTOMY     1985  . BAND HEMORRHOIDECTOMY     2010  . CHOLECYSTECTOMY     1950  . PAROTIDECTOMY     30 years ago    There were no vitals filed for this visit.   Subjective Assessment - 06/28/20 0945    Subjective relays she is having good days and bad days with her pain but today is a good day. She will get injections in her knee with Dr. Prince Rome tommorow    How long can you stand comfortably? less than 10 minutes    How long can you walk comfortably? depends    Pain Onset 1 to 4 weeks ago                             [facility] Adult PT Treatment/Exercise - 06/28/20 0001      Neuro Re-ed    Neuro Re-ed Details  SLS 10 sec X 3 reps bilat, tandem walk up/down X 3 reps no UE support, rocker board 20 reps A-P, 20 reps lateral      Knee/Hip Exercises: Stretches   Active Hamstring Stretch Both;3  reps;30 seconds    Active Hamstring Stretch Limitations seated    Gastroc Stretch Both;3 reps;30 seconds      Knee/Hip Exercises: Aerobic   Recumbent Bike 6 min L4      Knee/Hip Exercises: Machines for Strengthening   Cybex Leg Press 75 lbs bilat push 2X15, then 56 lbs for SL push 2X15 for Rt and LT      Knee/Hip Exercises: Standing   Other Standing Knee Exercises sidestepping without UE support with green band around knees at counter up/down X 3 reps      Knee/Hip Exercises: Seated   Sit to Sand 3 sets;5 reps;without UE support   tends to lose balance posteriorly     Knee/Hip Exercises: Supine   Bridges Both;2 sets;10 reps    Straight Leg Raises Both;2 sets;15 reps                       PT Long Term Goals - 06/13/20 1133      PT LONG TERM GOAL #1   Title Pt will be I  and compliant with HEP. (target for all goals 6 weeks 07/25/20)    Time 6    Period Weeks    Status New    Target Date 07/25/20      PT LONG TERM GOAL #2   Title She will increase Lt knee extension AROM to 3 deg or less from being straight.    Status New      PT LONG TERM GOAL #3   Title Pt will improve Lt hip strength to overall 4+/5 and Lt knee strength to 5/5 to improve function.    Status New      PT LONG TERM GOAL #4   Title Pt will be able to ambulate community distances at least 1000 ft on even and uneven surfaces and negotiate stairs without complaints.    Status New      PT LONG TERM GOAL #5   Title Pt will improve 5TSTS test to 13 seconds or less to show improved balance and endurance    Baseline 16 sec    Status New                 Plan - 06/28/20 1018    Clinical Impression Statement Session focused on leg strength and overall balance today. Is walking better without AD today but still with balance deficits. PT will continue to progress this as able.    Personal Factors and Comorbidities Comorbidity 3+    Comorbidities DDD lumbar stenosis, ITB syndrome right side, rt  shoulder adhesive capsulitis, CVA rt Hemiparesis, HTN, L3-5 laminectomy 2019,    Examination-Activity Limitations Bend;Carry;Lift;Stand;Stairs;Squat;Locomotion Level    Examination-Participation Restrictions Cleaning;Shop;Laundry;Yard Work    Stability/Clinical Decision Making Stable/Uncomplicated    Rehab Potential Good    PT Frequency 2x / week   1-2   PT Duration 6 weeks   4-6 weeks   PT Treatment/Interventions ADLs/Self Care Home Management;Cryotherapy;Electrical Stimulation;Iontophoresis 4mg /ml Dexamethasone;Moist Heat;Ultrasound;Gait training;Stair training;Functional mobility training;Therapeutic activities;Therapeutic exercise;Balance training;Neuromuscular re-education;Manual techniques;Passive range of motion;Dry needling;Joint Manipulations;Vasopneumatic Device;Taping    PT Next Visit Plan review and update HEP PRN, use resistance equipment needs and add functional strength and balance as able, needs knee ext ROM    PT Home Exercise Plan Access Code: [email or web address]    Consulted and Agree with Plan of Care Patient           Patient will benefit from skilled therapeutic intervention in order to improve the following deficits and impairments:  Abnormal gait, Decreased activity tolerance, Decreased balance, Decreased mobility, Decreased endurance, Decreased range of motion, Decreased strength, Increased edema, Difficulty walking, Increased fascial restricitons, Impaired flexibility, Pain  Visit Diagnosis: Acute pain of left knee  Stiffness of left knee, not elsewhere classified  Muscle weakness (generalized)  Localized edema     Problem List Patient Active Problem List   Diagnosis Date Noted  . Preop testing 07/09/2018  . Degenerative lumbar spinal stenosis 05/12/2018  . Statin myopathy 08/29/2016  . Iliotibial band syndrome of right side 04/23/2016  . Acromioclavicular arthrosis 06/30/2015  . Left carpal tunnel syndrome 05/15/2015  . Adhesive capsulitis of right shoulder  02/06/2015  . Dysarthria due to cerebrovascular accident 01/09/2015  . Hemiparesis affecting dominant side as late effect of cerebrovascular accident (HCC) 12/07/2014  . Spastic neurogenic bladder 10/31/2014  . Right rotator cuff tendonitis 10/19/2014  . Stenosis of cervical spine region   . Chronic right shoulder pain   . Cerebral infarction due to thrombosis of left middle cerebral artery (HCC)   . Left pontine stroke (HCC)   .  TIA (transient ischemic attack) 10/14/2014  . Overactive bladder 10/14/2014  . H/O: CVA (cerebrovascular accident) 10/14/2014  . NECK PAIN, CHRONIC 10/29/2010  . Essential hypertension 10/10/2009  . INSOMNIA 09/15/2009  . Pure hypercholesterolemia 11/28/2008  . MENOPAUSAL SYNDROME 11/28/2008    Birdie Riddle 06/28/2020, 10:20 AM  [facility] Physical Therapy [address] [place], [place], [address] Phone: [phone]   Fax:  [phone]  Name: Teresa Martinez MRN: [id number] Date of Birth: Aug 24, 1941

## 2020-06-29 ENCOUNTER — Encounter: Payer: Self-pay | Admitting: Orthopaedic Surgery

## 2020-06-29 ENCOUNTER — Ambulatory Visit (INDEPENDENT_AMBULATORY_CARE_PROVIDER_SITE_OTHER): Payer: Medicare PPO | Admitting: Orthopaedic Surgery

## 2020-06-29 DIAGNOSIS — Z9889 Other specified postprocedural states: Secondary | ICD-10-CM

## 2020-06-29 DIAGNOSIS — M1712 Unilateral primary osteoarthritis, left knee: Secondary | ICD-10-CM

## 2020-06-29 MED ORDER — HYALURONAN 88 MG/4ML IX SOSY
88.0000 mg | PREFILLED_SYRINGE | INTRA_ARTICULAR | Status: AC | PRN
  Administered 2020-06-29: 88 mg via INTRA_ARTICULAR

## 2020-06-29 NOTE — Progress Notes (Signed)
° °  Procedure Note  Patient: Teresa Martinez             Date of Birth: 02/02/1941           MRN: [id number]             Visit Date: 06/29/2020  Procedures: Visit Diagnoses:  1. Status post arthroscopy of left knee   2. Unilateral primary osteoarthritis, left knee     Large Joint Inj: L knee on 06/29/2020 3:49 PM Indications: diagnostic evaluation and pain Details: 22 G 1.5 in needle, superolateral approach  Arthrogram: No  Medications: 88 mg Hyaluronan 88 MG/4ML Outcome: tolerated well, no immediate complications Procedure, treatment alternatives, risks and benefits explained, specific risks discussed. Consent was given by the patient. Immediately prior to procedure a time out was called to verify the correct patient, procedure, equipment, support staff and site/side marked as required. Patient was prepped and draped in the usual sterile fashion.    The patient is an 79 year old female who is now 5-week status post a left knee arthroscopy for partial medial meniscectomy.  We did find cartilage loss in the medial compartment of the knee.  Today she is coming in for a Monovisc injection with hyaluronic acid in the left knee to treat the osteoarthritis pain.  She has been doing well otherwise.  On examination of her left knee there is a moderate effusion I did aspirate 25 cc of fluid from the knee.  I then placed a Monovisc injection of the left knee without difficulty.  She is going to outpatient physical therapy to work on her balance and coordination as well as strengthening her knees.  From my standpoint, follow-up can be as needed.  She can always have a steroid injection down the road if she needs in 3 months as well as a repeat hyaluronic acid in 6 months but only if needed.  All questions and concerns were answered and addressed.

## 2020-06-30 ENCOUNTER — Other Ambulatory Visit: Payer: Self-pay

## 2020-06-30 ENCOUNTER — Ambulatory Visit (INDEPENDENT_AMBULATORY_CARE_PROVIDER_SITE_OTHER): Payer: Medicare PPO | Admitting: Physical Therapy

## 2020-06-30 DIAGNOSIS — M25662 Stiffness of left knee, not elsewhere classified: Secondary | ICD-10-CM

## 2020-06-30 DIAGNOSIS — R600 Localized edema: Secondary | ICD-10-CM

## 2020-06-30 DIAGNOSIS — M6281 Muscle weakness (generalized): Secondary | ICD-10-CM

## 2020-06-30 DIAGNOSIS — M25562 Pain in left knee: Secondary | ICD-10-CM

## 2020-06-30 NOTE — Therapy (Signed)
[facility] Physical Therapy [address] [place], [place], [address] Phone: [phone]   Fax:  [phone]  Physical Therapy Treatment  Patient Details  Name: Teresa Martinez MRN: [id number] Date of Birth: 1941/03/03 Referring Provider (PT): Magnus Ivan   Encounter Date: 06/30/2020   PT End of Session - 06/30/20 0850    Visit Number 3    Number of Visits 12    Date for PT Re-Evaluation 07/25/20    Authorization Type [employer]    PT Start Time 0805    PT Stop Time 0845    PT Time Calculation (min) 40 min    Activity Tolerance Patient tolerated treatment well    Behavior During Therapy [facility] for tasks assessed/performed           Past Medical History:  Diagnosis Date  . Cervical spine degeneration    C5/C6  . Chronic right shoulder pain   . Hemiparesis affecting dominant side as late effect of cerebrovascular accident (HCC) 12/07/2014  . Hyperlipemia   . Hypertension   . Menopausal syndrome    Teresa Martinez)  . Overactive bladder   . Statin intolerance     Past Surgical History:  Procedure Laterality Date  . ABDOMINAL HYSTERECTOMY     1985  . BAND HEMORRHOIDECTOMY     2010  . CHOLECYSTECTOMY     1950  . PAROTIDECTOMY     30 years ago    There were no vitals filed for this visit.   Subjective Assessment - 06/30/20 0817    Subjective Relays the pain is better in her knee today, she had injection and fluid drained from it by MD.    How long can you stand comfortably? less than 10 minutes    How long can you walk comfortably? depends    Pain Onset 1 to 4 weeks ago             [facility] Adult PT Treatment/Exercise - 06/30/20 0001      Neuro Re-ed    Neuro Re-ed Details  SLS with cone taps , tandem walk up/down X 3 reps no UE support, balance on airex pad feet close together eyes closed, and tandem balance on pad      Knee/Hip Exercises: Stretches   Active Hamstring Stretch Both;3 reps;30 seconds    Active Hamstring Stretch  Limitations seated    Gastroc Stretch Both;3 reps;30 seconds      Knee/Hip Exercises: Aerobic   Nustep L8 X 6 min LE/UE      Knee/Hip Exercises: Machines for Strengthening   Cybex Knee Extension 20 lbs 3X10 bilat, then 10 lbs and 2X10 SL performed on both    Cybex Knee Flexion 25 lbs 3X10 bilat    Cybex Leg Press 81 lbs bilat push 2X15, then 56 lbs for SL push 2X15 for Rt and LT      Knee/Hip Exercises: Standing   Other Standing Knee Exercises sidestepping without UE support with green band around knees at counter up/down X 3 reps    Other Standing Knee Exercises fwd and lateral step ups 6 inch X 10 bilat      Knee/Hip Exercises: Seated   Sit to Sand 3 sets;5 reps;without UE support                       PT Long Term Goals - 06/13/20 1133      PT LONG TERM GOAL #1   Title Pt will be I and compliant with HEP. (  target for all goals 6 weeks 07/25/20)    Time 6    Period Weeks    Status New    Target Date 07/25/20      PT LONG TERM GOAL #2   Title She will increase Lt knee extension AROM to 3 deg or less from being straight.    Status New      PT LONG TERM GOAL #3   Title Pt will improve Lt hip strength to overall 4+/5 and Lt knee strength to 5/5 to improve function.    Status New      PT LONG TERM GOAL #4   Title Pt will be able to ambulate community distances at least 1000 ft on even and uneven surfaces and negotiate stairs without complaints.    Status New      PT LONG TERM GOAL #5   Title Pt will improve 5TSTS test to 13 seconds or less to show improved balance and endurance    Baseline 16 sec    Status New                 Plan - 06/30/20 8546    Clinical Impression Statement Progressed strength and balance today with good tolerance. Had better performance with sit to stands and tandem walk today. Continue POC    Personal Factors and Comorbidities Comorbidity 3+    Comorbidities DDD lumbar stenosis, ITB syndrome right side, rt shoulder adhesive  capsulitis, CVA rt Hemiparesis, HTN, L3-5 laminectomy 2019,    Examination-Activity Limitations Bend;Carry;Lift;Stand;Stairs;Squat;Locomotion Level    Examination-Participation Restrictions Cleaning;Shop;Laundry;Yard Work    Stability/Clinical Decision Making Stable/Uncomplicated    Rehab Potential Good    PT Frequency 2x / week   1-2   PT Duration 6 weeks   4-6 weeks   PT Treatment/Interventions ADLs/Self Care Home Management;Cryotherapy;Electrical Stimulation;Iontophoresis 4mg /ml Dexamethasone;Moist Heat;Ultrasound;Gait training;Stair training;Functional mobility training;Therapeutic activities;Therapeutic exercise;Balance training;Neuromuscular re-education;Manual techniques;Passive range of motion;Dry needling;Joint Manipulations;Vasopneumatic Device;Taping    PT Next Visit Plan review and update HEP PRN, use resistance equipment needs and add functional strength and balance as able, needs knee ext ROM    PT Home Exercise Plan Access Code: [email or web address]    Consulted and Agree with Plan of Care Patient           Patient will benefit from skilled therapeutic intervention in order to improve the following deficits and impairments:  Abnormal gait, Decreased activity tolerance, Decreased balance, Decreased mobility, Decreased endurance, Decreased range of motion, Decreased strength, Increased edema, Difficulty walking, Increased fascial restricitons, Impaired flexibility, Pain  Visit Diagnosis: Acute pain of left knee  Stiffness of left knee, not elsewhere classified  Muscle weakness (generalized)  Localized edema     Problem List Patient Active Problem List   Diagnosis Date Noted  . Preop testing 07/09/2018  . Degenerative lumbar spinal stenosis 05/12/2018  . Statin myopathy 08/29/2016  . Iliotibial band syndrome of right side 04/23/2016  . Acromioclavicular arthrosis 06/30/2015  . Left carpal tunnel syndrome 05/15/2015  . Adhesive capsulitis of right shoulder 02/06/2015  .  Dysarthria due to cerebrovascular accident 01/09/2015  . Hemiparesis affecting dominant side as late effect of cerebrovascular accident (HCC) 12/07/2014  . Spastic neurogenic bladder 10/31/2014  . Right rotator cuff tendonitis 10/19/2014  . Stenosis of cervical spine region   . Chronic right shoulder pain   . Cerebral infarction due to thrombosis of left middle cerebral artery (HCC)   . Left pontine stroke (HCC)   . TIA (transient ischemic attack) 10/14/2014  . Overactive  bladder 10/14/2014  . H/O: CVA (cerebrovascular accident) 10/14/2014  . NECK PAIN, CHRONIC 10/29/2010  . Essential hypertension 10/10/2009  . INSOMNIA 09/15/2009  . Pure hypercholesterolemia 11/28/2008  . MENOPAUSAL SYNDROME 11/28/2008    Teresa Martinez 06/30/2020, 8:51 AM  [facility] Physical Therapy [address] [place], [place], [address] Phone: [phone]   Fax:  [phone]  Name: Teresa Martinez MRN: [id number] Date of Birth: [birth year]

## 2020-07-05 ENCOUNTER — Ambulatory Visit (INDEPENDENT_AMBULATORY_CARE_PROVIDER_SITE_OTHER): Payer: Medicare PPO | Admitting: Physical Therapy

## 2020-07-05 ENCOUNTER — Telehealth: Payer: Self-pay | Admitting: Orthopaedic Surgery

## 2020-07-05 ENCOUNTER — Other Ambulatory Visit: Payer: Self-pay

## 2020-07-05 DIAGNOSIS — M6281 Muscle weakness (generalized): Secondary | ICD-10-CM

## 2020-07-05 DIAGNOSIS — M25662 Stiffness of left knee, not elsewhere classified: Secondary | ICD-10-CM | POA: Diagnosis not present

## 2020-07-05 DIAGNOSIS — R600 Localized edema: Secondary | ICD-10-CM | POA: Diagnosis not present

## 2020-07-05 DIAGNOSIS — M25562 Pain in left knee: Secondary | ICD-10-CM

## 2020-07-05 NOTE — Telephone Encounter (Signed)
Pt would like a Rx for a knee brace for whenever she's got to walk more than usual; pt would like a CB when this has been done.  [phone]

## 2020-07-05 NOTE — Telephone Encounter (Signed)
Patient will come by to get hinged knee brace Friday when she comes to PT

## 2020-07-05 NOTE — Therapy (Signed)
[facility] Physical Therapy [address] [place], [place], [address] Phone: [phone]   Fax:  [phone]  Physical Therapy Treatment  Patient Details  Name: Teresa Martinez MRN: [id number] Date of Birth: [birth year] Referring Provider (PT): Magnus Ivan   Encounter Date: 07/05/2020   PT End of Session - 07/05/20 0915    Visit Number 4    Number of Visits 12    Date for PT Re-Evaluation 07/25/20    Authorization Type [employer]    PT Start Time [phone]   she arrives late to 845 apt   PT Stop Time 0930    PT Time Calculation (min) 31 min    Activity Tolerance Patient tolerated treatment well    Behavior During Therapy [facility] for tasks assessed/performed           Past Medical History:  Diagnosis Date  . Cervical spine degeneration    C5/C6  . Chronic right shoulder pain   . Hemiparesis affecting dominant side as late effect of cerebrovascular accident (HCC) 12/07/2014  . Hyperlipemia   . Hypertension   . Menopausal syndrome    Teresa Martinez [place])  . Overactive bladder   . Statin intolerance     Past Surgical History:  Procedure Laterality Date  . ABDOMINAL HYSTERECTOMY     1985  . BAND HEMORRHOIDECTOMY     2010  . CHOLECYSTECTOMY     1950  . PAROTIDECTOMY     30 years ago    There were no vitals filed for this visit.   Subjective Assessment - 07/05/20 0904    Subjective relays no pain upon arrival but still gets twinges in her knees when she walks too long, she wants to know about knee brace and was encouraged to ask her doctor about that.    How long can you stand comfortably? less than 10 minutes    How long can you walk comfortably? depends    Currently in Pain? Yes    Pain Score 1     Pain Location Knee    Pain Orientation Left    Pain Onset 1 to 4 weeks ago              [facility] PT Assessment - 07/05/20 0001      Assessment   Medical Diagnosis s/p left knee arthroscopy    Referring Provider (PT) Magnus Ivan    Onset Date/Surgical  Date 05/25/20      AROM   Right Knee Flexion 140    Left Knee Extension 4      Strength   Right Hip Flexion 4+/5    Right Hip ABduction 4+/5    Left Hip Flexion 4+/5    Left Hip ABduction 4+/5    Right Knee Flexion 4+/5    Right Knee Extension 4+/5    Left Knee Flexion 4+/5    Left Knee Extension 5/5              [facility] Adult PT Treatment/Exercise - 07/05/20 0001      Neuro Re-ed    Neuro Re-ed Details  --      Knee/Hip Exercises: Stretches   Active Hamstring Stretch Both;3 reps;30 seconds    Active Hamstring Stretch Limitations seated    Gastroc Stretch Both;3 reps;30 seconds    Other Knee/Hip Stretches supine heelslides AAROM 10 sec X 10 times      Knee/Hip Exercises: Aerobic   Recumbent Bike 6 min L4-5      Knee/Hip Exercises: Machines for Strengthening   Cybex  Knee Extension 25 lbs 3X10    Cybex Knee Flexion 25 lbs 3X10 bilat    Cybex Leg Press 87 lbs bilat push 3/x10, then 56 lbs for SL push 2X15 for Rt and LT      Knee/Hip Exercises: Standing   Lateral Step Up Both;10 reps;Hand Hold: 0;Step Height: 6"    Forward Step Up Both;15 reps;Hand Hold: 0;Step Height: 6"    Other Standing Knee Exercises sidestepping without UE support with green band around knees at counter up/down X 3 reps    Other Standing Knee Exercises fwd and lateral step ups 6 inch X 10 bilat      Knee/Hip Exercises: Seated   Sit to Sand 5 reps;without UE support   4 sets     Knee/Hip Exercises: Supine   Quad Sets Both;15 reps    Quad Sets Limitations 5 sec holds with heels propped                       PT Long Term Goals - 07/05/20 0932      PT LONG TERM GOAL #1   Title Pt will be I and compliant with HEP. (target for all goals 6 weeks 07/25/20)    Baseline compliant with starter    Time 6    Period Weeks    Status On-going      PT LONG TERM GOAL #2   Title She will increase Lt knee extension AROM to 3 deg or less from being straight.    Baseline now 4 deg    Status  On-going      PT LONG TERM GOAL #3   Title Pt will improve Lt hip strength to overall 4+/5 and Lt knee strength to 5/5 to improve function.    Baseline 4+ now    Status On-going      PT LONG TERM GOAL #4   Title Pt will be able to ambulate community distances at least 1000 ft on even and uneven surfaces and negotiate stairs without complaints.    Baseline still with pain walking longer distances    Status On-going      PT LONG TERM GOAL #5   Title Pt will improve 5TSTS test to 13 seconds or less to show improved balance and endurance    Baseline 16 sec    Status On-going                 Plan - 07/05/20 9811    Clinical Impression Statement Updated measurements show good progress with her strength and ROM post op. PT will continue to progress this as tolerated.    Personal Factors and Comorbidities Comorbidity 3+    Comorbidities DDD lumbar stenosis, ITB syndrome right side, rt shoulder adhesive capsulitis, CVA rt Hemiparesis, HTN, L3-5 laminectomy 2019,    Examination-Activity Limitations Bend;Carry;Lift;Stand;Stairs;Squat;Locomotion Level    Examination-Participation Restrictions Cleaning;Shop;Laundry;Yard Work    Stability/Clinical Decision Making Stable/Uncomplicated    Rehab Potential Good    PT Frequency 2x / week   1-2   PT Duration 6 weeks   4-6 weeks   PT Treatment/Interventions ADLs/Self Care Home Management;Cryotherapy;Electrical Stimulation;Iontophoresis 4mg /ml Dexamethasone;Moist Heat;Ultrasound;Gait training;Stair training;Functional mobility training;Therapeutic activities;Therapeutic exercise;Balance training;Neuromuscular re-education;Manual techniques;Passive range of motion;Dry needling;Joint Manipulations;Vasopneumatic Device;Taping    PT Next Visit Plan use resistance equipment needs and add functional strength and balance as able, needs knee ext ROM    PT Home Exercise Plan Access Code: [email or web address]    Consulted and Agree with Plan of Care  Patient            Patient will benefit from skilled therapeutic intervention in order to improve the following deficits and impairments:  Abnormal gait, Decreased activity tolerance, Decreased balance, Decreased mobility, Decreased endurance, Decreased range of motion, Decreased strength, Increased edema, Difficulty walking, Increased fascial restricitons, Impaired flexibility, Pain  Visit Diagnosis: Acute pain of left knee  Stiffness of left knee, not elsewhere classified  Muscle weakness (generalized)  Localized edema     Problem List Patient Active Problem List   Diagnosis Date Noted  . Preop testing 07/09/2018  . Degenerative lumbar spinal stenosis 05/12/2018  . Statin myopathy 08/29/2016  . Iliotibial band syndrome of right side 04/23/2016  . Acromioclavicular arthrosis 06/30/2015  . Left carpal tunnel syndrome 05/15/2015  . Adhesive capsulitis of right shoulder 02/06/2015  . Dysarthria due to cerebrovascular accident 01/09/2015  . Hemiparesis affecting dominant side as late effect of cerebrovascular accident (HCC) 12/07/2014  . Spastic neurogenic bladder 10/31/2014  . Right rotator cuff tendonitis 10/19/2014  . Stenosis of cervical spine region   . Chronic right shoulder pain   . Cerebral infarction due to thrombosis of left middle cerebral artery (HCC)   . Left pontine stroke (HCC)   . TIA (transient ischemic attack) 10/14/2014  . Overactive bladder 10/14/2014  . H/O: CVA (cerebrovascular accident) 10/14/2014  . NECK PAIN, CHRONIC 10/29/2010  . Essential hypertension 10/10/2009  . INSOMNIA 09/15/2009  . Pure hypercholesterolemia 11/28/2008  . MENOPAUSAL SYNDROME 11/28/2008    April Manson, PT,DPT 07/05/2020, 9:36 AM  [facility] Physical Therapy [address] [place], [place], [address] Phone: [phone]   Fax:  [phone]  Name: Teresa Martinez MRN: [id number] Date of Birth: 1941-03-17

## 2020-07-05 NOTE — Telephone Encounter (Signed)
She had a recent knee scope, what would you like for her a hinge knee brace?

## 2020-07-05 NOTE — Telephone Encounter (Signed)
Hinged brace will be fine.

## 2020-07-07 ENCOUNTER — Ambulatory Visit (INDEPENDENT_AMBULATORY_CARE_PROVIDER_SITE_OTHER): Payer: Medicare PPO | Admitting: Physical Therapy

## 2020-07-07 ENCOUNTER — Telehealth: Payer: Self-pay

## 2020-07-07 ENCOUNTER — Other Ambulatory Visit: Payer: Self-pay

## 2020-07-07 DIAGNOSIS — R600 Localized edema: Secondary | ICD-10-CM

## 2020-07-07 DIAGNOSIS — M25662 Stiffness of left knee, not elsewhere classified: Secondary | ICD-10-CM | POA: Diagnosis not present

## 2020-07-07 DIAGNOSIS — M25562 Pain in left knee: Secondary | ICD-10-CM

## 2020-07-07 DIAGNOSIS — M6281 Muscle weakness (generalized): Secondary | ICD-10-CM

## 2020-07-07 NOTE — Therapy (Signed)
[facility] Physical Therapy [address] [place], [place], [address] Phone: [phone]   Fax:  [phone]  Physical Therapy Treatment  Patient Details  Name: Teresa Martinez MRN: [id number] Date of Birth: Jun 16, 1941 Referring Provider (PT): Magnus Ivan   Encounter Date: 07/07/2020   PT End of Session - 07/07/20 1106    Visit Number 5    Number of Visits 12    Date for PT Re-Evaluation 07/25/20    Authorization Type [employer]    PT Start Time 1017    PT Stop Time 1100    PT Time Calculation (min) 43 min    Activity Tolerance Patient tolerated treatment well    Behavior During Therapy [facility] for tasks assessed/performed           Past Medical History:  Diagnosis Date   Cervical spine degeneration    C5/C6   Chronic right shoulder pain    Hemiparesis affecting dominant side as late effect of cerebrovascular accident (HCC) 12/07/2014   Hyperlipemia    Hypertension    Menopausal syndrome    [place])   Overactive bladder    Statin intolerance     Past Surgical History:  Procedure Laterality Date   ABDOMINAL HYSTERECTOMY     1985   BAND HEMORRHOIDECTOMY     2010   CHOLECYSTECTOMY     1950   PAROTIDECTOMY     30 years ago    There were no vitals filed for this visit.   Subjective Assessment - 07/07/20 1044    Subjective relays she gets pain when she walks but thats about her only complaint. She asked MD about knee brace and she will get one today after PT visit.    How long can you stand comfortably? less than 10 minutes    How long can you walk comfortably? depends    Pain Onset 1 to 4 weeks ago             [facility] Adult PT Treatment/Exercise - 07/07/20 0001      Knee/Hip Exercises: Stretches   Active Hamstring Stretch Both;3 reps;30 seconds    Active Hamstring Stretch Limitations seated    Gastroc Stretch Both;3 reps;30 seconds      Knee/Hip Exercises: Aerobic   Nustep L8 X 8 min LE/UE      Knee/Hip  Exercises: Machines for Strengthening   Cybex Knee Extension 25 lbs 3X10    Cybex Knee Flexion 35 lbs 3X10 bilat    Cybex Leg Press 87 lbs bilat push 3/x15, then 62 lbs for SL presses X 20 reps       Knee/Hip Exercises: Standing   Lateral Step Up Both;10 reps;Hand Hold: 0;Step Height: 6";Hand Hold: 2    Forward Step Up Both;15 reps;Hand Hold: 0;Step Height: 6"    Other Standing Knee Exercises sidestepping without UE support with green band around knees at counter up/down X 4 reps. Tandem walk at counter top up/down X 3 reps      Knee/Hip Exercises: Seated   Sit to Sand 2 sets;10 reps;without UE support              PT Long Term Goals - 07/05/20 0932      PT LONG TERM GOAL #1   Title Pt will be I and compliant with HEP. (target for all goals 6 weeks 07/25/20)    Baseline compliant with starter    Time 6    Period Weeks    Status On-going      PT  LONG TERM GOAL #2   Title She will increase Lt knee extension AROM to 3 deg or less from being straight.    Baseline now 4 deg    Status On-going      PT LONG TERM GOAL #3   Title Pt will improve Lt hip strength to overall 4+/5 and Lt knee strength to 5/5 to improve function.    Baseline 4+ now    Status On-going      PT LONG TERM GOAL #4   Title Pt will be able to ambulate community distances at least 1000 ft on even and uneven surfaces and negotiate stairs without complaints.    Baseline still with pain walking longer distances    Status On-going      PT LONG TERM GOAL #5   Title Pt will improve 5TSTS test to 13 seconds or less to show improved balance and endurance    Baseline 16 sec    Status On-going                 Plan - 07/07/20 1107    Clinical Impression Statement Continued to address her impairments in balance and overall leg strength. She shows good effort with PT and is progressing strength well but balance is more slowly progressing.    Personal Factors and Comorbidities Comorbidity 3+    Comorbidities  DDD lumbar stenosis, ITB syndrome right side, rt shoulder adhesive capsulitis, CVA rt Hemiparesis, HTN, L3-5 laminectomy 2019,    Examination-Activity Limitations Bend;Carry;Lift;Stand;Stairs;Squat;Locomotion Level    Examination-Participation Restrictions Cleaning;Shop;Laundry;Yard Work    Stability/Clinical Decision Making Stable/Uncomplicated    Rehab Potential Good    PT Frequency 2x / week   1-2   PT Duration 6 weeks   4-6 weeks   PT Treatment/Interventions ADLs/Self Care Home Management;Cryotherapy;Electrical Stimulation;Iontophoresis 4mg /ml Dexamethasone;Moist Heat;Ultrasound;Gait training;Stair training;Functional mobility training;Therapeutic activities;Therapeutic exercise;Balance training;Neuromuscular re-education;Manual techniques;Passive range of motion;Dry needling;Joint Manipulations;Vasopneumatic Device;Taping    PT Next Visit Plan use resistance equipment needs and add functional strength and balance as able, needs knee ext ROM    PT Home Exercise Plan Access Code: [email or web address]    Consulted and Agree with Plan of Care Patient           Patient will benefit from skilled therapeutic intervention in order to improve the following deficits and impairments:  Abnormal gait, Decreased activity tolerance, Decreased balance, Decreased mobility, Decreased endurance, Decreased range of motion, Decreased strength, Increased edema, Difficulty walking, Increased fascial restricitons, Impaired flexibility, Pain  Visit Diagnosis: Acute pain of left knee  Stiffness of left knee, not elsewhere classified  Muscle weakness (generalized)  Localized edema     Problem List Patient Active Problem List   Diagnosis Date Noted   Preop testing 07/09/2018   Degenerative lumbar spinal stenosis 05/12/2018   Statin myopathy 08/29/2016   Iliotibial band syndrome of right side 04/23/2016   Acromioclavicular arthrosis 06/30/2015   Left carpal tunnel syndrome 05/15/2015   Adhesive capsulitis  of right shoulder 02/06/2015   Dysarthria due to cerebrovascular accident 01/09/2015   Hemiparesis affecting dominant side as late effect of cerebrovascular accident (HCC) 12/07/2014   Spastic neurogenic bladder 10/31/2014   Right rotator cuff tendonitis 10/19/2014   Stenosis of cervical spine region    Chronic right shoulder pain    Cerebral infarction due to thrombosis of left middle cerebral artery (HCC)    Left pontine stroke (HCC)    TIA (transient ischemic attack) 10/14/2014   Overactive bladder 10/14/2014   H/O: CVA (cerebrovascular accident) 10/14/2014  NECK PAIN, CHRONIC 10/29/2010   Essential hypertension 10/10/2009   INSOMNIA 09/15/2009   Pure hypercholesterolemia 11/28/2008   MENOPAUSAL SYNDROME 11/28/2008    Birdie Riddle 07/07/2020, 11:09 AM  [facility] Physical Therapy [address] [place], [place], [address] Phone: [phone]   Fax:  [phone]  Name: Teresa Martinez MRN: [id number] Date of Birth: Dec 25, 1940

## 2020-07-07 NOTE — Telephone Encounter (Signed)
Provided patient with brace.

## 2020-07-07 NOTE — Telephone Encounter (Signed)
Patient is here for PT and was advised to contact you and come get her brace.

## 2020-07-10 ENCOUNTER — Encounter: Payer: Medicare PPO | Admitting: Physical Therapy

## 2020-07-12 ENCOUNTER — Ambulatory Visit (INDEPENDENT_AMBULATORY_CARE_PROVIDER_SITE_OTHER): Payer: Medicare PPO | Admitting: Physical Therapy

## 2020-07-12 ENCOUNTER — Other Ambulatory Visit: Payer: Self-pay

## 2020-07-12 DIAGNOSIS — R600 Localized edema: Secondary | ICD-10-CM

## 2020-07-12 DIAGNOSIS — M6281 Muscle weakness (generalized): Secondary | ICD-10-CM

## 2020-07-12 DIAGNOSIS — M25662 Stiffness of left knee, not elsewhere classified: Secondary | ICD-10-CM | POA: Diagnosis not present

## 2020-07-12 DIAGNOSIS — M25562 Pain in left knee: Secondary | ICD-10-CM | POA: Diagnosis not present

## 2020-07-12 NOTE — Therapy (Signed)
[facility] Physical Therapy [address] [place], [place], [address] Phone: [phone]   Fax:  [phone]  Physical Therapy Treatment  Patient Details  Name: Teresa Martinez MRN: [id number] Date of Birth: 1941-08-05 Referring Provider (PT): Magnus Ivan   Encounter Date: 07/12/2020   PT End of Session - 07/12/20 1058    Visit Number 6    Number of Visits 12    Date for PT Re-Evaluation 07/25/20    Authorization Type [employer]    PT Start Time 1015    PT Stop Time 1055    PT Time Calculation (min) 40 min    Activity Tolerance Patient tolerated treatment well    Behavior During Therapy [facility] for tasks assessed/performed           Past Medical History:  Diagnosis Date   Cervical spine degeneration    C5/C6   Chronic right shoulder pain    Hemiparesis affecting dominant side as late effect of cerebrovascular accident (HCC) 12/07/2014   Hyperlipemia    Hypertension    Menopausal syndrome    Floyde Parkins)   Overactive bladder    Statin intolerance     Past Surgical History:  Procedure Laterality Date   ABDOMINAL HYSTERECTOMY     1985   BAND HEMORRHOIDECTOMY     2010   CHOLECYSTECTOMY     1950   PAROTIDECTOMY     30 years ago    There were no vitals filed for this visit.   Subjective Assessment - 07/12/20 1026    Subjective she relays 2/10 pain in her knee today    How long can you stand comfortably? less than 10 minutes    How long can you walk comfortably? depends    Pain Onset 1 to 4 weeks ago              [facility] Adult PT Treatment/Exercise - 07/12/20 0001      Neuro Re-ed    Neuro Re-ed Details  in bars: walking on foam beam fwd and lateral (lateral did bother her knee some to was discontinued). Rocker board A-P and lateral, SLS      Knee/Hip Exercises: Stretches   Active Hamstring Stretch Both;3 reps;30 seconds    Active Hamstring Stretch Limitations seated    Gastroc Stretch Both;3 reps;30 seconds    Other  Knee/Hip Stretches standing lunge stretch for flexion 10 sec X 10 reps      Knee/Hip Exercises: Aerobic   Nustep L10 X 8 min LE/UE      Knee/Hip Exercises: Machines for Strengthening   Cybex Knee Extension 25 lbs 3X10    Cybex Knee Flexion 35 lbs 3X10 bilat    Cybex Leg Press 87 lbs bilat push 3/x15, then 75 lbs for SL presses 2X15 ea side      Knee/Hip Exercises: Standing   Stairs stairs up/down one flight in clinic hall, has to use Hand rail for balance but can perform reciprocally    Other Standing Knee Exercises deadlift 10 lbs 2X10 reps                       PT Long Term Goals - 07/05/20 0932      PT LONG TERM GOAL #1   Title Pt will be I and compliant with HEP. (target for all goals 6 weeks 07/25/20)    Baseline compliant with starter    Time 6    Period Weeks    Status On-going  PT LONG TERM GOAL #2   Title She will increase Lt knee extension AROM to 3 deg or less from being straight.    Baseline now 4 deg    Status On-going      PT LONG TERM GOAL #3   Title Pt will improve Lt hip strength to overall 4+/5 and Lt knee strength to 5/5 to improve function.    Baseline 4+ now    Status On-going      PT LONG TERM GOAL #4   Title Pt will be able to ambulate community distances at least 1000 ft on even and uneven surfaces and negotiate stairs without complaints.    Baseline still with pain walking longer distances    Status On-going      PT LONG TERM GOAL #5   Title Pt will improve 5TSTS test to 13 seconds or less to show improved balance and endurance    Baseline 16 sec    Status On-going                 Plan - 07/12/20 1059    Clinical Impression Statement Overall balance was some better today and able to perform higher level balance challenges. Strength and ROM continueing to do well in her knee post op. She does need cues to slow down on her exercises and use more eccentric control. Continue POC.    Personal Factors and Comorbidities  Comorbidity 3+    Comorbidities DDD lumbar stenosis, ITB syndrome right side, rt shoulder adhesive capsulitis, CVA rt Hemiparesis, HTN, L3-5 laminectomy 2019,    Examination-Activity Limitations Bend;Carry;Lift;Stand;Stairs;Squat;Locomotion Level    Examination-Participation Restrictions Cleaning;Shop;Laundry;Yard Work    Stability/Clinical Decision Making Stable/Uncomplicated    Rehab Potential Good    PT Frequency 2x / week   1-2   PT Duration 6 weeks   4-6 weeks   PT Treatment/Interventions ADLs/Self Care Home Management;Cryotherapy;Electrical Stimulation;Iontophoresis 4mg /ml Dexamethasone;Moist Heat;Ultrasound;Gait training;Stair training;Functional mobility training;Therapeutic activities;Therapeutic exercise;Balance training;Neuromuscular re-education;Manual techniques;Passive range of motion;Dry needling;Joint Manipulations;Vasopneumatic Device;Taping    PT Next Visit Plan use resistance equipment needs and add functional strength and balance as able, needs knee ext ROM    PT Home Exercise Plan Access Code: [email or web address]    Consulted and Agree with Plan of Care Patient           Patient will benefit from skilled therapeutic intervention in order to improve the following deficits and impairments:  Abnormal gait, Decreased activity tolerance, Decreased balance, Decreased mobility, Decreased endurance, Decreased range of motion, Decreased strength, Increased edema, Difficulty walking, Increased fascial restricitons, Impaired flexibility, Pain  Visit Diagnosis: Acute pain of left knee  Stiffness of left knee, not elsewhere classified  Muscle weakness (generalized)  Localized edema     Problem List Patient Active Problem List   Diagnosis Date Noted   Preop testing 07/09/2018   Degenerative lumbar spinal stenosis 05/12/2018   Statin myopathy 08/29/2016   Iliotibial band syndrome of right side 04/23/2016   Acromioclavicular arthrosis 06/30/2015   Left carpal tunnel syndrome  05/15/2015   Adhesive capsulitis of right shoulder 02/06/2015   Dysarthria due to cerebrovascular accident 01/09/2015   Hemiparesis affecting dominant side as late effect of cerebrovascular accident (HCC) 12/07/2014   Spastic neurogenic bladder 10/31/2014   Right rotator cuff tendonitis 10/19/2014   Stenosis of cervical spine region    Chronic right shoulder pain    Cerebral infarction due to thrombosis of left middle cerebral artery (HCC)    Left pontine stroke (HCC)    TIA (  transient ischemic attack) 10/14/2014   Overactive bladder 10/14/2014   H/O: CVA (cerebrovascular accident) 10/14/2014   NECK PAIN, CHRONIC 10/29/2010   Essential hypertension 10/10/2009   INSOMNIA 09/15/2009   Pure hypercholesterolemia 11/28/2008   MENOPAUSAL SYNDROME 11/28/2008    Birdie Riddle 07/12/2020, 11:01 AM  [facility] Physical Therapy [address] [place], [place], [address] Phone: [phone]   Fax:  [phone]  Name: Teresa Martinez MRN: [id number] Date of Birth: June 24, 1941

## 2020-07-14 ENCOUNTER — Encounter: Payer: Self-pay | Admitting: Rehabilitative and Restorative Service Providers"

## 2020-07-14 ENCOUNTER — Ambulatory Visit (INDEPENDENT_AMBULATORY_CARE_PROVIDER_SITE_OTHER): Payer: Medicare PPO | Admitting: Rehabilitative and Restorative Service Providers"

## 2020-07-14 ENCOUNTER — Other Ambulatory Visit: Payer: Self-pay

## 2020-07-14 DIAGNOSIS — M25662 Stiffness of left knee, not elsewhere classified: Secondary | ICD-10-CM

## 2020-07-14 DIAGNOSIS — R600 Localized edema: Secondary | ICD-10-CM

## 2020-07-14 DIAGNOSIS — M6281 Muscle weakness (generalized): Secondary | ICD-10-CM

## 2020-07-14 DIAGNOSIS — M25562 Pain in left knee: Secondary | ICD-10-CM | POA: Diagnosis not present

## 2020-07-14 NOTE — Therapy (Signed)
[facility] Physical Therapy [address] [place], [place], [address] Phone: [phone]   Fax:  [phone]  Physical Therapy Treatment  Patient Details  Name: Teresa Martinez MRN: [id number] Date of Birth: 06-Nov-1941 Referring Provider (PT): Magnus Ivan   Encounter Date: 07/14/2020   PT End of Session - 07/14/20 1053    Visit Number 7    Number of Visits 12    Date for PT Re-Evaluation 07/25/20    Authorization Type [employer]    Progress Note Due on Visit 10    PT Start Time 1100    PT Stop Time 1139    PT Time Calculation (min) 39 min    Activity Tolerance Patient tolerated treatment well    Behavior During Therapy [facility] for tasks assessed/performed           Past Medical History:  Diagnosis Date   Cervical spine degeneration    C5/C6   Chronic right shoulder pain    Hemiparesis affecting dominant side as late effect of cerebrovascular accident (HCC) 12/07/2014   Hyperlipemia    Hypertension    Menopausal syndrome    Floyde Parkins)   Overactive bladder    Statin intolerance     Past Surgical History:  Procedure Laterality Date   ABDOMINAL HYSTERECTOMY     1985   BAND HEMORRHOIDECTOMY     2010   CHOLECYSTECTOMY     1950   PAROTIDECTOMY     30 years ago    There were no vitals filed for this visit.   Subjective Assessment - 07/14/20 1103    Subjective Pt. stated no pain upon arrival today.  Sometimes felt something c stepping wrong way    How long can you stand comfortably? less than 10 minutes    How long can you walk comfortably? depends    Currently in Pain? No/denies    Pain Score 0-No pain    Pain Onset 1 to 4 weeks ago                             [facility] Adult PT Treatment/Exercise - 07/14/20 0001      Neuro Re-ed    Neuro Re-ed Details  // bars: foam tandem fwd 8 ft x 10, rocker board 30 x fwd/back, side to side each       Knee/Hip Exercises: Stretches   Gastroc Stretch 30  seconds;Both;3 reps      Knee/Hip Exercises: Aerobic   Nustep Lvl 10 8 mins UE/LE      Knee/Hip Exercises: Machines for Strengthening   Cybex Knee Extension 25 lbs 3X10    Cybex Knee Flexion 35 lbs 3X10 bilat    Cybex Leg Press 87 lbs bilat push 3/x15, then 75 lbs for SL presses 2X15 ea side      Knee/Hip Exercises: Standing   Other Standing Knee Exercises dealift 15 lbs 2 x 10                       PT Long Term Goals - 07/05/20 0932      PT LONG TERM GOAL #1   Title Pt will be I and compliant with HEP. (target for all goals 6 weeks 07/25/20)    Baseline compliant with starter    Time 6    Period Weeks    Status On-going      PT LONG TERM GOAL #2   Title She will increase Lt knee  extension AROM to 3 deg or less from being straight.    Baseline now 4 deg    Status On-going      PT LONG TERM GOAL #3   Title Pt will improve Lt hip strength to overall 4+/5 and Lt knee strength to 5/5 to improve function.    Baseline 4+ now    Status On-going      PT LONG TERM GOAL #4   Title Pt will be able to ambulate community distances at least 1000 ft on even and uneven surfaces and negotiate stairs without complaints.    Baseline still with pain walking longer distances    Status On-going      PT LONG TERM GOAL #5   Title Pt will improve 5TSTS test to 13 seconds or less to show improved balance and endurance    Baseline 16 sec    Status On-going                 Plan - 07/14/20 1124    Clinical Impression Statement Pt. continued to demonstrate fair control on compliant surface balance activity and may continue to benefit from intervention to progress and improve.    Personal Factors and Comorbidities Comorbidity 3+    Comorbidities DDD lumbar stenosis, ITB syndrome right side, rt shoulder adhesive capsulitis, CVA rt Hemiparesis, HTN, L3-5 laminectomy 2019,    Examination-Activity Limitations Bend;Carry;Lift;Stand;Stairs;Squat;Locomotion Level     Examination-Participation Restrictions Cleaning;Shop;Laundry;Yard Work    Stability/Clinical Decision Making Stable/Uncomplicated    Rehab Potential Good    PT Frequency 2x / week   1-2   PT Duration 6 weeks   4-6 weeks   PT Treatment/Interventions ADLs/Self Care Home Management;Cryotherapy;Electrical Stimulation;Iontophoresis 4mg /ml Dexamethasone;Moist Heat;Ultrasound;Gait training;Stair training;Functional mobility training;Therapeutic activities;Therapeutic exercise;Balance training;Neuromuscular re-education;Manual techniques;Passive range of motion;Dry needling;Joint Manipulations;Vasopneumatic Device;Taping    PT Next Visit Plan Static and dynamic movement coordination.    PT Home Exercise Plan Access Code: [email or web address]    Consulted and Agree with Plan of Care Patient           Patient will benefit from skilled therapeutic intervention in order to improve the following deficits and impairments:  Abnormal gait, Decreased activity tolerance, Decreased balance, Decreased mobility, Decreased endurance, Decreased range of motion, Decreased strength, Increased edema, Difficulty walking, Increased fascial restricitons, Impaired flexibility, Pain  Visit Diagnosis: Acute pain of left knee  Stiffness of left knee, not elsewhere classified  Muscle weakness (generalized)  Localized edema     Problem List Patient Active Problem List   Diagnosis Date Noted   Preop testing 07/09/2018   Degenerative lumbar spinal stenosis 05/12/2018   Statin myopathy 08/29/2016   Iliotibial band syndrome of right side 04/23/2016   Acromioclavicular arthrosis 06/30/2015   Left carpal tunnel syndrome 05/15/2015   Adhesive capsulitis of right shoulder 02/06/2015   Dysarthria due to cerebrovascular accident 01/09/2015   Hemiparesis affecting dominant side as late effect of cerebrovascular accident (HCC) 12/07/2014   Spastic neurogenic bladder 10/31/2014   Right rotator cuff tendonitis 10/19/2014    Stenosis of cervical spine region    Chronic right shoulder pain    Cerebral infarction due to thrombosis of left middle cerebral artery (HCC)    Left pontine stroke (HCC)    TIA (transient ischemic attack) 10/14/2014   Overactive bladder 10/14/2014   H/O: CVA (cerebrovascular accident) 10/14/2014   NECK PAIN, CHRONIC 10/29/2010   Essential hypertension 10/10/2009   INSOMNIA 09/15/2009   Pure hypercholesterolemia 11/28/2008   MENOPAUSAL SYNDROME 11/28/2008    01/26/2009  [place], DPT, OCS, ATC 07/14/20  11:33 AM    [facility] Physical Therapy [address] [place], [place], [address] Phone: [phone]   Fax:  [phone]  Name: JESSIAH WOJNAR MRN: [id number] Date of Birth: 11-Dec-1940

## 2020-07-19 ENCOUNTER — Encounter: Payer: Self-pay | Admitting: Physical Therapy

## 2020-07-19 ENCOUNTER — Other Ambulatory Visit: Payer: Self-pay

## 2020-07-19 ENCOUNTER — Ambulatory Visit (INDEPENDENT_AMBULATORY_CARE_PROVIDER_SITE_OTHER): Payer: Medicare PPO | Admitting: Physical Therapy

## 2020-07-19 DIAGNOSIS — M25662 Stiffness of left knee, not elsewhere classified: Secondary | ICD-10-CM

## 2020-07-19 DIAGNOSIS — M6281 Muscle weakness (generalized): Secondary | ICD-10-CM | POA: Diagnosis not present

## 2020-07-19 DIAGNOSIS — R600 Localized edema: Secondary | ICD-10-CM

## 2020-07-19 DIAGNOSIS — M25562 Pain in left knee: Secondary | ICD-10-CM | POA: Diagnosis not present

## 2020-07-19 NOTE — Therapy (Signed)
[facility] Physical Therapy [address] [place], [place], [address] Phone: [phone]   Fax:  [phone]  Physical Therapy Treatment  Patient Details  Name: Teresa Martinez MRN: [id number] Date of Birth: 1941-06-18 Referring Provider (PT): Ninfa Linden   Encounter Date: 07/19/2020   PT End of Session - 07/19/20 1047    Visit Number 8    Number of Visits 12    Date for PT Re-Evaluation 07/25/20    Authorization Type [employer]    Progress Note Due on Visit 10    PT Start Time 1011    PT Stop Time 1049    PT Time Calculation (min) 38 min    Activity Tolerance Patient tolerated treatment well    Behavior During Therapy [facility] for tasks assessed/performed           Past Medical History:  Diagnosis Date  . Cervical spine degeneration    C5/C6  . Chronic right shoulder pain   . Hemiparesis affecting dominant side as late effect of cerebrovascular accident ([place]) 12/07/2014  . Hyperlipemia   . Hypertension   . Menopausal syndrome    [name])  . Overactive bladder   . Statin intolerance     Past Surgical History:  Procedure Laterality Date  . ABDOMINAL HYSTERECTOMY     1985  . BAND HEMORRHOIDECTOMY     2010  . CHOLECYSTECTOMY     1950  . PAROTIDECTOMY     30 years ago    There were no vitals filed for this visit.   Subjective Assessment - 07/19/20 1017    Subjective She states her husband fell and she had to help him up and get him to hospital so this "hurt her leg yesterday"    How long can you stand comfortably? less than 10 minutes    How long can you walk comfortably? depends    Pain Onset 1 to 4 weeks ago              [facility] PT Assessment - 07/19/20 0001      Assessment   Medical Diagnosis s/p left knee arthroscopy    Referring Provider (PT) Ninfa Linden    Onset Date/Surgical Date 05/25/20      Strength   Right Hip Flexion 4+/5    Right Hip ABduction 4+/5    Left Hip Flexion 4+/5    Left Hip ABduction 4+/5    Right  Knee Flexion 5/5    Right Knee Extension 5/5    Left Knee Flexion 5/5    Left Knee Extension 5/5      Standardized Balance Assessment   Five times sit to stand comments  8.8 sec no UE support              [facility] Adult PT Treatment/Exercise - 07/19/20 0001      Neuro Re-ed    Neuro Re-ed Details  // bars: tandem fwd 8 ft, march walk X 8      Knee/Hip Exercises: Stretches   Press photographer 30 seconds;Both;3 reps      Knee/Hip Exercises: Aerobic   Nustep Lvl 10 8 mins UE/LE      Knee/Hip Exercises: Machines for Strengthening   Cybex Knee Extension 25 lbs 3X10    Cybex Knee Flexion 35 lbs 3X10 bilat    Cybex Leg Press 93 lbs bilat push 3x10, then 75 lbs for SL presses 3X10ea side      Knee/Hip Exercises: Standing   Other Standing Knee Exercises dealift 15 lbs 2  x 10                PT Long Term Goals - 07/19/20 1050      PT LONG TERM GOAL #1   Title Pt will be I and compliant with HEP. (target for all goals 6 weeks 07/25/20)    Baseline met 9/1    Time 6    Period Weeks    Status Achieved      PT LONG TERM GOAL #2   Title She will increase Lt knee extension AROM to 3 deg or less from being straight.    Baseline now 4 deg    Status On-going      PT LONG TERM GOAL #3   Title Pt will improve Lt hip strength to overall 4+/5 and Lt knee strength to 5/5 to improve function.    Baseline met 9/1    Status Achieved      PT LONG TERM GOAL #4   Title Pt will be able to ambulate community distances at least 1000 ft on even and uneven surfaces and negotiate stairs without complaints.    Baseline still with pain walking longer distances    Status On-going      PT LONG TERM GOAL #5   Title Pt will improve 5TSTS test to 13 seconds or less to show improved balance and endurance    Baseline 8.8 sec on 9/1    Status Achieved                 Plan - 07/19/20 1048    Clinical Impression Statement She has met her TUG goal and strength goal now. She showed good exercise  tolerance today dispite report of "hurting her leg yesterday". Continue POC    Personal Factors and Comorbidities Comorbidity 3+    Comorbidities DDD lumbar stenosis, ITB syndrome right side, rt shoulder adhesive capsulitis, CVA rt Hemiparesis, HTN, L3-5 laminectomy 2019,    Examination-Activity Limitations Bend;Carry;Lift;Stand;Stairs;Squat;Locomotion Level    Examination-Participation Restrictions Cleaning;Shop;Laundry;Yard Work    Stability/Clinical Decision Making Stable/Uncomplicated    Rehab Potential Good    PT Frequency 2x / week   1-2   PT Duration 6 weeks   4-6 weeks   PT Treatment/Interventions ADLs/Self Care Home Management;Cryotherapy;Electrical Stimulation;Iontophoresis 23m/ml Dexamethasone;Moist Heat;Ultrasound;Gait training;Stair training;Functional mobility training;Therapeutic activities;Therapeutic exercise;Balance training;Neuromuscular re-education;Manual techniques;Passive range of motion;Dry needling;Joint Manipulations;Vasopneumatic Device;Taping    PT Next Visit Plan Static and dynamic movement coordination.    PT Home Exercise Plan Access Code: [email or web address]    Consulted and Agree with Plan of Care Patient           Patient will benefit from skilled therapeutic intervention in order to improve the following deficits and impairments:  Abnormal gait, Decreased activity tolerance, Decreased balance, Decreased mobility, Decreased endurance, Decreased range of motion, Decreased strength, Increased edema, Difficulty walking, Increased fascial restricitons, Impaired flexibility, Pain  Visit Diagnosis: Acute pain of left knee  Stiffness of left knee, not elsewhere classified  Muscle weakness (generalized)  Localized edema     Problem List Patient Active Problem List   Diagnosis Date Noted  . Preop testing 07/09/2018  . Degenerative lumbar spinal stenosis 05/12/2018  . Statin myopathy 08/29/2016  . Iliotibial band syndrome of right side 04/23/2016  .  Acromioclavicular arthrosis 06/30/2015  . Left carpal tunnel syndrome 05/15/2015  . Adhesive capsulitis of right shoulder 02/06/2015  . Dysarthria due to cerebrovascular accident 01/09/2015  . Hemiparesis affecting dominant side as late effect of cerebrovascular accident ([place] 12/07/2014  .  Spastic neurogenic bladder 10/31/2014  . Right rotator cuff tendonitis 10/19/2014  . Stenosis of cervical spine region   . Chronic right shoulder pain   . Cerebral infarction due to thrombosis of left middle cerebral artery ([place])   . Left pontine stroke ([place])   . TIA (transient ischemic attack) 10/14/2014  . Overactive bladder 10/14/2014  . H/O: CVA (cerebrovascular accident) 10/14/2014  . NECK PAIN, CHRONIC 10/29/2010  . Essential hypertension 10/10/2009  . INSOMNIA 09/15/2009  . Pure hypercholesterolemia 11/28/2008  . MENOPAUSAL SYNDROME 11/28/2008    Silvestre Mesi 07/19/2020, 10:53 AM  [facility] Physical Therapy [address] [place], [place], [address] Phone: [phone]   Fax:  [phone]  Name: DAMIKA HARMON MRN: [id number] Date of Birth: 04/19/1941

## 2020-07-21 ENCOUNTER — Ambulatory Visit (INDEPENDENT_AMBULATORY_CARE_PROVIDER_SITE_OTHER): Payer: Medicare PPO | Admitting: Physical Therapy

## 2020-07-21 ENCOUNTER — Other Ambulatory Visit: Payer: Self-pay

## 2020-07-21 DIAGNOSIS — R600 Localized edema: Secondary | ICD-10-CM | POA: Diagnosis not present

## 2020-07-21 DIAGNOSIS — M6281 Muscle weakness (generalized): Secondary | ICD-10-CM

## 2020-07-21 DIAGNOSIS — M25562 Pain in left knee: Secondary | ICD-10-CM | POA: Diagnosis not present

## 2020-07-21 DIAGNOSIS — M25662 Stiffness of left knee, not elsewhere classified: Secondary | ICD-10-CM

## 2020-07-21 NOTE — Therapy (Signed)
[facility] Physical Therapy [address] [place], [place], [address] Phone: [phone]   Fax:  [phone]  Physical Therapy Treatment  Patient Details  Name: Teresa Martinez MRN: [id number] Date of Birth: [birth year] Referring Provider (PT): Ninfa Linden   Encounter Date: 07/21/2020   PT End of Session - 07/21/20 1047    Visit Number 9    Number of Visits 12    Date for PT Re-Evaluation 07/25/20    Authorization Type [employer]    Progress Note Due on Visit 10    PT Start Time 1011    PT Stop Time 1045    PT Time Calculation (min) 34 min    Activity Tolerance Patient tolerated treatment well    Behavior During Therapy [facility] for tasks assessed/performed           Past Medical History:  Diagnosis Date  . Cervical spine degeneration    C5/C6  . Chronic right shoulder pain   . Hemiparesis affecting dominant side as late effect of cerebrovascular accident ([place]) 12/07/2014  . Hyperlipemia   . Hypertension   . Menopausal syndrome    Teresa Martinez)  . Overactive bladder   . Statin intolerance     Past Surgical History:  Procedure Laterality Date  . ABDOMINAL HYSTERECTOMY     1985  . BAND HEMORRHOIDECTOMY     2010  . CHOLECYSTECTOMY     1950  . PAROTIDECTOMY     30 years ago    There were no vitals filed for this visit.   Subjective Assessment - 07/21/20 1027    Subjective she relays her leg is overall doing some better from last session    How long can you stand comfortably? less than 10 minutes    How long can you walk comfortably? depends    Pain Onset 1 to 4 weeks ago             [facility] Adult PT Treatment/Exercise - 07/21/20 0001      Neuro Re-ed    Neuro Re-ed Details  // bars: tandem fwd 8 ft, march walk X 8      Knee/Hip Exercises: Stretches   Active Hamstring Stretch Both;3 reps;30 seconds    Gastroc Stretch 30 seconds;Both;3 reps      Knee/Hip Exercises: Aerobic   Nustep Lvl 10 10 mins UE/LE      Knee/Hip Exercises:  Machines for Strengthening   Cybex Knee Extension 25 lbs 3X12    Cybex Knee Flexion 35 lbs 3X12 bilat    Cybex Leg Press 93 lbs bilat push 3x10, then 75 lbs for SL presses 3X10ea side      Knee/Hip Exercises: Standing   Other Standing Knee Exercises deadlift 15 lbs 2 x 10                       PT Long Term Goals - 07/19/20 1050      PT LONG TERM GOAL #1   Title Pt will be I and compliant with HEP. (target for all goals 6 weeks 07/25/20)    Baseline met 9/1    Time 6    Period Weeks    Status Achieved      PT LONG TERM GOAL #2   Title She will increase Lt knee extension AROM to 3 deg or less from being straight.    Baseline now 4 deg    Status On-going      PT LONG TERM GOAL #3   Title  Pt will improve Lt hip strength to overall 4+/5 and Lt knee strength to 5/5 to improve function.    Baseline met 9/1    Status Achieved      PT LONG TERM GOAL #4   Title Pt will be able to ambulate community distances at least 1000 ft on even and uneven surfaces and negotiate stairs without complaints.    Baseline still with pain walking longer distances    Status On-going      PT LONG TERM GOAL #5   Title Pt will improve 5TSTS test to 13 seconds or less to show improved balance and endurance    Baseline 8.8 sec on 9/1    Status Achieved                 Plan - 07/21/20 1048    Clinical Impression Statement She was very fatigued during session as she has not slept due to being at the hospital to support her husband. Ended session a little early due to this. She will need progress note next visit.    Personal Factors and Comorbidities Comorbidity 3+    Comorbidities DDD lumbar stenosis, ITB syndrome right side, rt shoulder adhesive capsulitis, CVA rt Hemiparesis, HTN, L3-5 laminectomy 2019,    Examination-Activity Limitations Bend;Carry;Lift;Stand;Stairs;Squat;Locomotion Level    Examination-Participation Restrictions Cleaning;Shop;Laundry;Yard Work    Stability/Clinical  Decision Making Stable/Uncomplicated    Rehab Potential Good    PT Frequency 2x / week   1-2   PT Duration 6 weeks   4-6 weeks   PT Treatment/Interventions ADLs/Self Care Home Management;Cryotherapy;Electrical Stimulation;Iontophoresis 69m/ml Dexamethasone;Moist Heat;Ultrasound;Gait training;Stair training;Functional mobility training;Therapeutic activities;Therapeutic exercise;Balance training;Neuromuscular re-education;Manual techniques;Passive range of motion;Dry needling;Joint Manipulations;Vasopneumatic Device;Taping    PT Next Visit Plan Static and dynamic movement coordination.    PT Home Exercise Plan Access Code: [email or web address]    Consulted and Agree with Plan of Care Patient           Patient will benefit from skilled therapeutic intervention in order to improve the following deficits and impairments:  Abnormal gait, Decreased activity tolerance, Decreased balance, Decreased mobility, Decreased endurance, Decreased range of motion, Decreased strength, Increased edema, Difficulty walking, Increased fascial restricitons, Impaired flexibility, Pain  Visit Diagnosis: Acute pain of left knee  Stiffness of left knee, not elsewhere classified  Muscle weakness (generalized)  Localized edema     Problem List Patient Active Problem List   Diagnosis Date Noted  . Preop testing 07/09/2018  . Degenerative lumbar spinal stenosis 05/12/2018  . Statin myopathy 08/29/2016  . Iliotibial band syndrome of right side 04/23/2016  . Acromioclavicular arthrosis 06/30/2015  . Left carpal tunnel syndrome 05/15/2015  . Adhesive capsulitis of right shoulder 02/06/2015  . Dysarthria due to cerebrovascular accident 01/09/2015  . [place]ip[place] affecting dominant [place]  . Spastic neurogenic bladder 10/31/2014  . Right rotator cuff tendonitis 10/19/2014  . Stenosis of cervical spine region   . Chronic right shoulder pain   . Cerebral infarction  due [place]   . Left pontine stroke ([place]   . TIA (transient ischemic attack) 10/14/2014  . Overactive bladder 10/14/2014  . H/O: CVA (cerebrovascular accident) 10/14/2014  . NECK PAIN, CHRONIC 10/29/2010  . Essential hypertension 10/10/2009  . INSOMNIA 09/15/2009  . Pure hypercholesterolemia 11/28/2008  . MENOPAUSAL SYNDROME 11/28/2008    BSilvestre Mesi9/01/2020, 10:49 AM  [facility] [address] [address]Phone: [phone]  Fax:  [phone]  Name: Teresa Martinez MRN: [id number] Date of Birth: December 30, 1940

## 2020-07-26 ENCOUNTER — Encounter: Payer: Self-pay | Admitting: Internal Medicine

## 2020-07-26 ENCOUNTER — Ambulatory Visit (INDEPENDENT_AMBULATORY_CARE_PROVIDER_SITE_OTHER): Payer: Medicare PPO | Admitting: Internal Medicine

## 2020-07-26 ENCOUNTER — Other Ambulatory Visit: Payer: Self-pay

## 2020-07-26 VITALS — BP 130/80 | HR 82 | Temp 98.2°F | Ht 65.75 in | Wt 130.7 lb

## 2020-07-26 DIAGNOSIS — I10 Essential (primary) hypertension: Secondary | ICD-10-CM | POA: Diagnosis not present

## 2020-07-26 DIAGNOSIS — T466X5A Adverse effect of antihyperlipidemic and antiarteriosclerotic drugs, initial encounter: Secondary | ICD-10-CM

## 2020-07-26 DIAGNOSIS — G720 Drug-induced myopathy: Secondary | ICD-10-CM | POA: Diagnosis not present

## 2020-07-26 DIAGNOSIS — Z0000 Encounter for general adult medical examination without abnormal findings: Secondary | ICD-10-CM | POA: Diagnosis not present

## 2020-07-26 DIAGNOSIS — E7800 Pure hypercholesterolemia, unspecified: Secondary | ICD-10-CM

## 2020-07-26 DIAGNOSIS — Z23 Encounter for immunization: Secondary | ICD-10-CM | POA: Diagnosis not present

## 2020-07-26 DIAGNOSIS — I63312 Cerebral infarction due to thrombosis of left middle cerebral artery: Secondary | ICD-10-CM | POA: Diagnosis not present

## 2020-07-26 DIAGNOSIS — R6889 Other general symptoms and signs: Secondary | ICD-10-CM | POA: Diagnosis not present

## 2020-07-26 NOTE — Addendum Note (Signed)
Addended by: Kern Reap B on: 07/26/2020 04:29 PM   Modules accepted: Orders

## 2020-07-26 NOTE — Patient Instructions (Addendum)
-Nice seeing you today!!  -Lab work today; will notify you once results are available.  -Flu vaccine today.  -Schedule follow up in 6 months.   Preventive Care 81 Years and Older, Female Preventive care refers to lifestyle choices and visits with your health care provider that can promote health and wellness. This includes:  A yearly physical exam. This is also called an annual well check.  Regular dental and eye exams.  Immunizations.  Screening for certain conditions.  Healthy lifestyle choices, such as diet and exercise. What can I expect for my preventive care visit? Physical exam Your health care provider will check:  Height and weight. These may be used to calculate body mass index (BMI), which is a measurement that tells if you are at a healthy weight.  Heart rate and blood pressure.  Your skin for abnormal spots. Counseling Your health care provider may ask you questions about:  Alcohol, tobacco, and drug use.  Emotional well-being.  Home and relationship well-being.  Sexual activity.  Eating habits.  History of falls.  Memory and ability to understand (cognition).  Work and work environment.  Pregnancy and menstrual history. What immunizations do I need?  Influenza (flu) vaccine  This is recommended every year. Tetanus, diphtheria, and pertussis (Tdap) vaccine  You may need a Td booster every 10 years. Varicella (chickenpox) vaccine  You may need this vaccine if you have not already been vaccinated. Zoster (shingles) vaccine  You may need this after age 60. Pneumococcal conjugate (PCV13) vaccine  One dose is recommended after age 65. Pneumococcal polysaccharide (PPSV23) vaccine  One dose is recommended after age 65. Measles, mumps, and rubella (MMR) vaccine  You may need at least one dose of MMR if you were born in 1957 or later. You may also need a second dose. Meningococcal conjugate (MenACWY) vaccine  You may need this if you have  certain conditions. Hepatitis A vaccine  You may need this if you have certain conditions or if you travel or work in places where you may be exposed to hepatitis A. Hepatitis B vaccine  You may need this if you have certain conditions or if you travel or work in places where you may be exposed to hepatitis B. Haemophilus influenzae type b (Hib) vaccine  You may need this if you have certain conditions. You may receive vaccines as individual doses or as more than one vaccine together in one shot (combination vaccines). Talk with your health care provider about the risks and benefits of combination vaccines. What tests do I need? Blood tests  Lipid and cholesterol levels. These may be checked every 5 years, or more frequently depending on your overall health.  Hepatitis C test.  Hepatitis B test. Screening  Lung cancer screening. You may have this screening every year starting at age 55 if you have a 30-pack-year history of smoking and currently smoke or have quit within the past 15 years.  Colorectal cancer screening. All adults should have this screening starting at age 50 and continuing until age 75. Your health care provider may recommend screening at age 45 if you are at increased risk. You will have tests every 1-10 years, depending on your results and the type of screening test.  Diabetes screening. This is done by checking your blood sugar (glucose) after you have not eaten for a while (fasting). You may have this done every 1-3 years.  Mammogram. This may be done every 1-2 years. Talk with your health care provider about   how often you should have regular mammograms.  BRCA-related cancer screening. This may be done if you have a family history of breast, ovarian, tubal, or peritoneal cancers. Other tests  Sexually transmitted disease (STD) testing.  Bone density scan. This is done to screen for osteoporosis. You may have this done starting at age 59. Follow these  instructions at home: Eating and drinking  Eat a diet that includes fresh fruits and vegetables, whole grains, lean protein, and low-fat dairy products. Limit your intake of foods with high amounts of sugar, saturated fats, and salt.  Take vitamin and mineral supplements as recommended by your health care provider.  Do not drink alcohol if your health care provider tells you not to drink.  If you drink alcohol: ? Limit how much you have to 0-1 drink a day. ? Be aware of how much alcohol is in your drink. In the [place] one drink equals one 12 oz bottle of beer (355 mL), one 5 oz glass of wine (148 mL), or one 1 oz glass of hard liquor (44 mL). Lifestyle  Take daily care of your teeth and gums.  Stay active. Exercise for at least 30 minutes on 5 or more days each week.  Do not use any products that contain nicotine or tobacco, such as cigarettes, e-cigarettes, and chewing tobacco. If you need help quitting, ask your health care provider.  If you are sexually active, practice safe sex. Use a condom or other form of protection in order to prevent STIs (sexually transmitted infections).  Talk with your health care provider about taking a low-dose aspirin or statin. What's next?  Go to your health care provider once a year for a well check visit.  Ask your health care provider how often you should have your eyes and teeth checked.  Stay up to date on all vaccines. This information is not intended to replace advice given to you by your health care provider. Make sure you discuss any questions you have with your health care provider. Document Revised: 10/29/2018 Document Reviewed: 10/29/2018 Elsevier Patient Education  2020 [employer].

## 2020-07-26 NOTE — Progress Notes (Signed)
Established Patient Office Visit     This visit occurred during the SARS-CoV-2 public health emergency.  Safety protocols were in place, including screening questions prior to the visit, additional usage of staff PPE, and extensive cleaning of exam room while observing appropriate contact time as indicated for disinfecting solutions.    CC/Reason for Visit: Annual preventive exam and subsequent Medicare wellness visit  HPI: Teresa Martinez is a 79 y.o. female who is coming in today for the above mentioned reasons. Past Medical History is significant for: Hyperlipidemia with statin intolerance on Zetia with last LDL of 193, prior left pontine CVA, history of hypertension that has been well controlled.  She is in remarkable health for her age.  She does both cardio and weight training on a daily basis.  She eats very healthy.  Since I last saw her she unfortunately suffered a left knee meniscal injury and had surgery.  She is going through PT and has her knee braced.  She has routine eye and dental care.  Unfortunately her husband has been diagnosed with pancreatic cancer and they have been dealing with that.  She stopped taking lisinopril due to hypotension around the time of surgery.  At last visit we had discussed discontinuation of routine cancer screening due to age, she has decided to pursue colonoscopy which I do not think is unreasonable given her remarkable health.  Her mammogram and DEXA scans are up-to-date.  She is requesting flu vaccine today.   Past Medical/Surgical History: Past Medical History:  Diagnosis Date  . Cervical spine degeneration    C5/C6  . Chronic right shoulder pain   . Hemiparesis affecting dominant side as late effect of cerebrovascular accident ([place]) 12/07/2014  . Hyperlipemia   . Hypertension   . Menopausal syndrome    [name])  . Overactive bladder   . Statin intolerance     Past Surgical History:  Procedure Laterality Date  .  ABDOMINAL HYSTERECTOMY     1985  . BAND HEMORRHOIDECTOMY     2010  . CHOLECYSTECTOMY     1950  . PAROTIDECTOMY     30 years ago    Social History:  reports that she has never smoked. She has never used smokeless tobacco. She reports that she does not drink alcohol and does not use drugs.  Allergies: Allergies  Allergen Reactions  . Statins Other (See Comments)    Muscle pain and unable to walk Muscle pain and unable to walk  . Latex Other (See Comments)    Burns her skin    Family History:  Family History  Problem Relation Age of Onset  . Heart failure Father   . Diabetes Mother   . Hypertension Mother   . Stroke Mother   . Cirrhosis Sister   . Diabetes Sister   . Hyperlipidemia Brother   . Hypertension Brother   . Stroke Brother   . Diabetes Brother      Current Outpatient Medications:  .  aspirin EC 325 MG EC tablet, Take 1 tablet (325 mg total) by mouth daily., Disp: 100 tablet, Rfl: 1 .  conjugated estrogens (PREMARIN) vaginal cream, Place 0.5 g vaginally., Disp: , Rfl:  .  diazepam (VALIUM) 5 MG tablet, TAKE ONE TAB ONE HOUR PRIOR TO MRI REPEAT AS NEEDED #2 . ZERO REFILLS, Disp: 2 tablet, Rfl: 0 .  ezetimibe (ZETIA) 10 MG tablet, TAKE 1 TABLET(10 MG) BY MOUTH DAILY, Disp: 90 tablet, Rfl: 1 .  fesoterodine (  TOVIAZ) 4 MG TB24 tablet, TAKE 1 TABLET(4 MG) BY MOUTH DAILY, Disp: 90 tablet, Rfl: 1 .  HYDROcodone-acetaminophen (NORCO/VICODIN) 5-325 MG tablet, Take 1-2 tablets by mouth every 6 (six) hours as needed for moderate pain., Disp: 30 tablet, Rfl: 0 .  lisinopril (ZESTRIL) 10 MG tablet, TAKE 1 TABLET(10 MG) BY MOUTH DAILY (Patient not taking: Reported on 07/26/2020), Disp: 90 tablet, Rfl: 1  Review of Systems:  Constitutional: Denies fever, chills, diaphoresis, appetite change and fatigue.  HEENT: Denies photophobia, eye pain, redness, hearing loss, ear pain, congestion, sore throat, rhinorrhea, sneezing, mouth sores, trouble swallowing, neck pain, neck stiffness  and tinnitus.   Respiratory: Denies SOB, DOE, cough, chest tightness,  and wheezing.   Cardiovascular: Denies chest pain, palpitations and leg swelling.  Gastrointestinal: Denies nausea, vomiting, abdominal pain, diarrhea, constipation, blood in stool and abdominal distention.  Genitourinary: Denies dysuria, urgency, frequency, hematuria, flank pain and difficulty urinating.  Endocrine: Denies: hot or cold intolerance, sweats, changes in hair or nails, polyuria, polydipsia. Musculoskeletal: Denies myalgias, back pain. Skin: Denies pallor, rash and wound.  Neurological: Denies dizziness, seizures, syncope, weakness, light-headedness, numbness and headaches.  Hematological: Denies adenopathy. Easy bruising, personal or family bleeding history  Psychiatric/Behavioral: Denies suicidal ideation, mood changes, confusion, nervousness, sleep disturbance and agitation    Physical Exam: Vitals:   07/26/20 0935  BP: 130/80  Pulse: 82  Temp: 98.2 F (36.8 C)  TempSrc: Oral  SpO2: 96%  Weight: 130 lb 11.2 oz (59.3 kg)  Height: 5' 5.75" (1.67 m)    Body mass index is 21.26 kg/m.   Constitutional: NAD, calm, comfortable Eyes: PERRL, lids and conjunctivae normal ENMT: Mucous membranes are moist.Tympanic membrane is pearly white, no erythema or bulging. Neck: normal, supple, no masses, no thyromegaly Respiratory: clear to auscultation bilaterally, no wheezing, no crackles. Normal respiratory effort. No accessory muscle use.  Cardiovascular: Regular rate and rhythm, no murmurs / rubs / gallops. No extremity edema. 2+ pedal pulses. No carotid bruits.  Abdomen: no tenderness, no masses palpated. No hepatosplenomegaly. Bowel sounds positive.  Musculoskeletal: no clubbing / cyanosis. No joint deformity upper and lower extremities. Good ROM, no contractures. Normal muscle tone.  Skin: no rashes, lesions, ulcers. No induration Neurologic: CN 2-12 grossly intact. Sensation intact, DTR normal. Strength  5/5 in all 4.  Psychiatric: Normal judgment and insight. Alert and oriented x 3. Normal mood.    Subsequent Medicare wellness visit   1. Risk factors, based on past  M,S,F -cardiovascular disease risk factors include age, history of hyperlipidemia, history of hypertension   2.  Physical activities: Prior to her knee injury she was walking and doing weight training on a daily basis   3.  Depression/mood:  Stable, not depressed   4.  Hearing:  No perceived issues   5.  ADL's: Independent in all ADLs   6.  Fall risk:  Low fall risk   7.  Home safety: No problems identified   8.  Height weight, and visual acuity: Height and weight as above, visual acuity is 20/32 on the right, 20/25 on the left and 20/25 combined   9.  Counseling:  Advised to continue PT sessions as prescribed by her orthopedist   10. Lab orders based on risk factors: Laboratory update will be reviewed   11. Referral :  None today   12. Care plan:  Follow-up with me in 6 months   13. Cognitive assessment:  No cognitive impairment   14. Screening: Patient provided with a written  and personalized 5-10 year screening schedule in the AVS.   yes   15. Provider List Update:   PCP, orthopedist, Dr. Ninfa Linden  16. Advance Directives: Full code     Office Visit from 07/23/2019 in [place] at [facility]  PHQ-9 Total Score 0      Fall Risk  07/23/2019 12/02/2018 03/12/2018 02/17/2017 10/29/2016  Falls in the past year? 0 0 No No No  Comment - - - - -  Number falls in past yr: 0 0 - - -  Injury with Fall? 0 0 - - -  Risk for fall due to : - - - - -  Follow up - - - - -  Comment - - - - -     Impression and Plan:  Encounter for preventive health examination -She has routine eye and dental care. -Flu vaccine today, otherwise immunizations are up-to-date. -Healthy lifestyle discussed in detail. -Screening labs today. -She has elected to continue cancer screening, she is due for mammogram in December, she  is requesting repeat referral to GI for colonoscopy. -DEXA scan was completed in March 2021.  Pure hypercholesterolemia   Statin myopathy -Check cholesterol today, she is on ezetimibe, history of statin induced myopathy.  Essential hypertension -Well-controlled, she is off lisinopril.  Cerebral infarction due to thrombosis of left middle cerebral artery (HCC) -On aspirin for secondary stroke prevention  Need for influenza vaccine -Flu vaccine administered today.    Patient Instructions  -Nice seeing you today!!  -Lab work today; will notify you once results are available.  -Flu vaccine today.  -Schedule follow up in 6 months.   Preventive Care 57 Years and Older, Female Preventive care refers to lifestyle choices and visits with your health care provider that can promote health and wellness. This includes:  A yearly physical exam. This is also called an annual well check.  Regular dental and eye exams.  Immunizations.  Screening for certain conditions.  Healthy lifestyle choices, such as diet and exercise. What can I expect for my preventive care visit? Physical exam Your health care provider will check:  Height and weight. These may be used to calculate body mass index (BMI), which is a measurement that tells if you are at a healthy weight.  Heart rate and blood pressure.  Your skin for abnormal spots. Counseling Your health care provider may ask you questions about:  Alcohol, tobacco, and drug use.  Emotional well-being.  Home and relationship well-being.  Sexual activity.  Eating habits.  History of falls.  Memory and ability to understand (cognition).  Work and work Statistician.  Pregnancy and menstrual history. What immunizations do I need?  Influenza (flu) vaccine  This is recommended every year. Tetanus, diphtheria, and pertussis (Tdap) vaccine  You may need a Td booster every 10 years. Varicella (chickenpox) vaccine  You may need  this vaccine if you have not already been vaccinated. Zoster (shingles) vaccine  You may need this after age 54. Pneumococcal conjugate (PCV13) vaccine  One dose is recommended after age 70. Pneumococcal polysaccharide (PPSV23) vaccine  One dose is recommended after age 15. Measles, mumps, and rubella (MMR) vaccine  You may need at least one dose of MMR if you were born in 1957 or later. You may also need a second dose. Meningococcal conjugate (MenACWY) vaccine  You may need this if you have certain conditions. Hepatitis A vaccine  You may need this if you have certain conditions or if you travel or work in places where  you may be exposed to hepatitis A. Hepatitis B vaccine  You may need this if you have certain conditions or if you travel or work in places where you may be exposed to hepatitis B. Haemophilus influenzae type b (Hib) vaccine  You may need this if you have certain conditions. You may receive vaccines as individual doses or as more than one vaccine together in one shot (combination vaccines). Talk with your health care provider about the risks and benefits of combination vaccines. What tests do I need? Blood tests  Lipid and cholesterol levels. These may be checked every 5 years, or more frequently depending on your overall health.  Hepatitis C test.  Hepatitis B test. Screening  Lung cancer screening. You may have this screening every year starting at age 50 if you have a 30-pack-year history of smoking and currently smoke or have quit within the past 15 years.  Colorectal cancer screening. All adults should have this screening starting at age 25 and continuing until age 33. Your health care provider may recommend screening at age 82 if you are at increased risk. You will have tests every 1-10 years, depending on your results and the type of screening test.  Diabetes screening. This is done by checking your blood sugar (glucose) after you have not eaten for a  while (fasting). You may have this done every 1-3 years.  Mammogram. This may be done every 1-2 years. Talk with your health care provider about how often you should have regular mammograms.  BRCA-related cancer screening. This may be done if you have a family history of breast, ovarian, tubal, or peritoneal cancers. Other tests  Sexually transmitted disease (STD) testing.  Bone density scan. This is done to screen for osteoporosis. You may have this done starting at age 84. Follow these instructions at home: Eating and drinking  Eat a diet that includes fresh fruits and vegetables, whole grains, lean protein, and low-fat dairy products. Limit your intake of foods with high amounts of sugar, saturated fats, and salt.  Take vitamin and mineral supplements as recommended by your health care provider.  Do not drink alcohol if your health care provider tells you not to drink.  If you drink alcohol: ? Limit how much you have to 0-1 drink a day. ? Be aware of how much alcohol is in your drink. In the [place] one drink equals one 12 oz bottle of beer (355 mL), one 5 oz glass of wine (148 mL), or one 1 oz glass of hard liquor (44 mL). Lifestyle  Take daily care of your teeth and gums.  Stay active. Exercise for at least 30 minutes on 5 or more days each week.  Do not use any products that contain nicotine or tobacco, such as cigarettes, e-cigarettes, and chewing tobacco. If you need help quitting, ask your health care provider.  If you are sexually active, practice safe sex. Use a condom or other form of protection in order to prevent STIs (sexually transmitted infections).  Talk with your health care provider about taking a low-dose aspirin or statin. What's next?  Go to your health care provider once a year for a well check visit.  Ask your health care provider how often you should have your eyes and teeth checked.  Stay up to date on all vaccines. This information is not intended to  replace advice given to you by your health care provider. Make sure you discuss any questions you have with your health care provider.  Document Revised: 10/29/2018 Document Reviewed: 10/29/2018 Elsevier Patient Education  2020 [place], [place] [facility]

## 2020-07-27 LAB — HEMOGLOBIN A1C
Hgb A1c MFr Bld: 5.7 % of total Hgb — ABNORMAL HIGH (ref <5.7)
Mean Plasma Glucose: 117 (calc)
eAG (mmol/L): 6.5 (calc)

## 2020-07-27 LAB — COMPREHENSIVE METABOLIC PANEL
AG Ratio: 1.8 (calc) (ref 1.0–2.5)
ALT: 10 U/L (ref 6–29)
AST: 18 U/L (ref 10–35)
Albumin: 4.5 g/dL (ref 3.6–5.1)
Alkaline phosphatase (APISO): 65 U/L (ref 37–153)
BUN: 16 mg/dL (ref 7–25)
CO2: 28 mmol/L (ref 20–32)
Calcium: 10.1 mg/dL (ref 8.6–10.4)
Chloride: 105 mmol/L (ref 98–110)
Creat: 0.8 mg/dL (ref 0.60–0.88)
Globulin: 2.5 g/dL (calc) (ref 1.9–3.7)
Glucose, Bld: 98 mg/dL (ref 65–99)
Potassium: 4.2 mmol/L (ref 3.5–5.3)
Sodium: 142 mmol/L (ref 135–146)
Total Bilirubin: 0.4 mg/dL (ref 0.2–1.2)
Total Protein: 7 g/dL (ref 6.1–8.1)

## 2020-07-27 LAB — LIPID PANEL
Cholesterol: 212 mg/dL — ABNORMAL HIGH (ref <200)
HDL: 53 mg/dL (ref >=50)
LDL Cholesterol (Calc): 142 mg/dL (calc) — ABNORMAL HIGH
Non-HDL Cholesterol (Calc): 159 mg/dL (calc) — ABNORMAL HIGH (ref <130)
Total CHOL/HDL Ratio: 4 (calc) (ref <5.0)
Triglycerides: 75 mg/dL (ref <150)

## 2020-07-27 LAB — VITAMIN B12: Vitamin B-12: 1408 pg/mL — ABNORMAL HIGH (ref 200–1100)

## 2020-07-27 LAB — CBC WITH DIFFERENTIAL/PLATELET
Absolute Monocytes: 533 cells/uL (ref 200–950)
Basophils Absolute: 29 cells/uL (ref 0–200)
Basophils Relative: 0.4 %
Eosinophils Absolute: 58 cells/uL (ref 15–500)
Eosinophils Relative: 0.8 %
HCT: 43 % (ref 35.0–45.0)
Hemoglobin: 14 g/dL (ref 11.7–15.5)
Lymphs Abs: 1750 cells/uL (ref 850–3900)
MCH: 26.1 pg — ABNORMAL LOW (ref 27.0–33.0)
MCHC: 32.6 g/dL (ref 32.0–36.0)
MCV: 80.1 fL (ref 80.0–100.0)
MPV: 10.7 fL (ref 7.5–12.5)
Monocytes Relative: 7.4 %
Neutro Abs: 4831 cells/uL (ref 1500–7800)
Neutrophils Relative %: 67.1 %
Platelets: 229 10*3/uL (ref 140–400)
RBC: 5.37 10*6/uL — ABNORMAL HIGH (ref 3.80–5.10)
RDW: 13.1 % (ref 11.0–15.0)
Total Lymphocyte: 24.3 %
WBC: 7.2 10*3/uL (ref 3.8–10.8)

## 2020-07-27 LAB — VITAMIN D 25 HYDROXY (VIT D DEFICIENCY, FRACTURES): Vit D, 25-Hydroxy: 47 ng/mL (ref 30–100)

## 2020-07-27 LAB — TSH: TSH: 0.95 mIU/L (ref 0.40–4.50)

## 2020-07-31 ENCOUNTER — Ambulatory Visit (INDEPENDENT_AMBULATORY_CARE_PROVIDER_SITE_OTHER): Payer: Medicare PPO | Admitting: Physical Therapy

## 2020-07-31 ENCOUNTER — Other Ambulatory Visit: Payer: Self-pay

## 2020-07-31 DIAGNOSIS — M25662 Stiffness of left knee, not elsewhere classified: Secondary | ICD-10-CM | POA: Diagnosis not present

## 2020-07-31 DIAGNOSIS — M25562 Pain in left knee: Secondary | ICD-10-CM | POA: Diagnosis not present

## 2020-07-31 DIAGNOSIS — M6281 Muscle weakness (generalized): Secondary | ICD-10-CM | POA: Diagnosis not present

## 2020-07-31 DIAGNOSIS — R600 Localized edema: Secondary | ICD-10-CM

## 2020-07-31 NOTE — Therapy (Signed)
[facility] [address] [place], [place], [address] Phone: [phone]   Fax:  [phone]  Physical Therapy Treatment/Progress note Progress Note reporting period 06/13/20  to 07/31/20  See below for objective and subjective measurements relating to patients progress with PT.   Patient Details  Name: Teresa Martinez MRN: [id number] Date of Birth: 06-13-1941 Referring Provider (PT): Ninfa Linden   Encounter Date: 07/31/2020   PT End of Session - 07/31/20 1035    Visit Number 10    Number of Visits 18    Date for PT Re-Evaluation 09/04/20    Authorization Type [employer], reauth sent 9/13    Progress Note Due on Visit 10    PT Start Time 1010    PT Stop Time 1100    PT Time Calculation (min) 50 min    Activity Tolerance Patient tolerated treatment well    Behavior During Therapy [facility] for tasks assessed/performed           Past Medical History:  Diagnosis Date   Cervical spine degeneration    C5/C6   Chronic right shoulder pain    Hemiparesis affecting dominant side as late effect of cerebrovascular accident ([place]) 12/07/2014   Hyperlipemia    Hypertension    Menopausal syndrome    [place]ia [place])   Overactive bladder    Statin intolerance     Past Surgical History:  Procedure Laterality Date   ABDOMINAL HYSTERECTOMY     1985   BAND HEMORRHOIDECTOMY     2010   CHOLECYSTECTOMY     1950   PAROTIDECTOMY     30 years ago    There were no vitals filed for this visit.   Subjective Assessment - 07/31/20 1021    Subjective She relays her knee is not painful but very sore today. She wants to extend her PT and feels she needs another 4 weeks to improve balance.    How long can you stand comfortably? less than 10 minutes    How long can you walk comfortably? depends    Pain Onset 1 to 4 weeks ago              [facility] PT Assessment - 07/31/20 0001      Assessment   Medical Diagnosis s/p left knee arthroscopy      Referring Provider (PT) Ninfa Linden    Onset Date/Surgical Date 05/25/20      Strength   Right Hip Flexion 4+/5    Right Hip ABduction 4+/5    Left Hip Flexion 4+/5    Left Hip ABduction 4+/5    Right Knee Flexion 5/5    Right Knee Extension 5/5    Left Knee Flexion 5/5    Left Knee Extension 5/5      Standardized Balance Assessment   Five times sit to stand comments  8.8 sec no UE support                         [facility] Adult PT Treatment/Exercise - 07/31/20 0001      Neuro Re-ed    Neuro Re-ed Details  // bars: tandem fwd 8 ft, march walk, retro walk X 6      Knee/Hip Exercises: Stretches   Active Hamstring Stretch Both;3 reps;30 seconds    Quad Stretch Both;3 reps;20 seconds    Quad Stretch Limitations standing    Gastroc Stretch 30 seconds;Both;3 reps      Knee/Hip Exercises: Aerobic   Nustep Lvl  10 10 mins UE/LE      Knee/Hip Exercises: Machines for Strengthening   Cybex Knee Extension 25 lbs 3X12    Cybex Knee Flexion 35 lbs 3X12 bilat    Cybex Leg Press 100 lbs bilat push 3x10, then 75 lbs for SL presses 3X10ea side      Modalities   Modalities Vasopneumatic      Vasopneumatic   Number Minutes Vasopneumatic  10 minutes    Vasopnuematic Location  Knee    Vasopneumatic Pressure Medium    Vasopneumatic Temperature  34                       PT Long Term Goals - 07/31/20 1038      PT LONG TERM GOAL #1   Title Pt will be I and compliant with HEP. (target for all goals 6 weeks 07/25/20)    Baseline met 9/1    Time 6    Period Weeks    Status Achieved      PT LONG TERM GOAL #2   Title She will increase Lt knee extension AROM to 3 deg or less from being straight.    Baseline now 4 deg    Status On-going      PT LONG TERM GOAL #3   Title Pt will improve Lt hip strength to overall 4+/5 and Lt knee strength to 5/5 to improve function.    Baseline met 9/1    Status Achieved      PT LONG TERM GOAL #4   Title Pt will be able to  ambulate community distances at least 1000 ft on even and uneven surfaces and negotiate stairs without complaints.    Baseline still with pain walking longer distances    Status On-going      PT LONG TERM GOAL #5   Title Pt will improve 5TSTS test to 13 seconds or less to show improved balance and endurance    Baseline 8.8 sec on 9/1    Status Achieved                 Plan - 07/31/20 1038    Clinical Impression Statement Progress note shows she has improved with PT in knee strength, knee ROM, balance, and overall endurance. However still with deficits paticularly in balance and Rt leg strength due to hemiparesis. She has met 3/5 PT goals. She will benefit from 4 more weeks of skilled PT to maximize function and allow her to try to meet her PT goals. She did have moderate amount of edema in her Lt knee today so used vasopneumatic in efforts to reduce this    Personal Factors and Comorbidities Comorbidity 3+    Comorbidities DDD lumbar stenosis, ITB syndrome right side, rt shoulder adhesive capsulitis, CVA rt Hemiparesis, HTN, L3-5 laminectomy 2019,    Examination-Activity Limitations Bend;Carry;Lift;Stand;Stairs;Squat;Locomotion Level    Examination-Participation Restrictions Cleaning;Shop;Laundry;Yard Work    Stability/Clinical Decision Making Stable/Uncomplicated    Rehab Potential Good    PT Frequency 2x / week   1-2   PT Duration 6 weeks   4-6 weeks   PT Treatment/Interventions ADLs/Self Care Home Management;Cryotherapy;Electrical Stimulation;Iontophoresis 87m/ml Dexamethasone;Moist Heat;Ultrasound;Gait training;Stair training;Functional mobility training;Therapeutic activities;Therapeutic exercise;Balance training;Neuromuscular re-education;Manual techniques;Passive range of motion;Dry needling;Joint Manipulations;Vasopneumatic Device;Taping    PT Next Visit Plan leg strength, balance, Static and dynamic movement coordination.    PT Home Exercise Plan Access Code: [email or web address]     Consulted and Agree with Plan of Care Patient  Patient will benefit from skilled therapeutic intervention in order to improve the following deficits and impairments:  Abnormal gait, Decreased activity tolerance, Decreased balance, Decreased mobility, Decreased endurance, Decreased range of motion, Decreased strength, Increased edema, Difficulty walking, Increased fascial restricitons, Impaired flexibility, Pain  Visit Diagnosis: Acute pain of left knee  Stiffness of left knee, not elsewhere classified  Muscle weakness (generalized)  Localized edema     Problem List Patient Active Problem List   Diagnosis Date Noted   Preop testing 07/09/2018   Degenerative lumbar spinal stenosis 05/12/2018   Statin myopathy 08/29/2016   Iliotibial band syndrome of right side 04/23/2016   Acromioclavicular arthrosis 06/30/2015   Left carpal tunnel syndrome 05/15/2015   Adhesive capsulitis of right shoulder 02/06/2015   Dysarthria due to cerebrovascular accident 01/09/2015   Hemiparesis affecting dominant side as late effect of cerebrovascular accident ([place]) 12/07/2014   Spastic neurogenic bladder 10/31/2014   Right rotator cuff tendonitis 10/19/2014   Stenosis of cervical spine region    Chronic right shoulder pain    Cerebral infarction due to thrombosis of left middle cerebral artery ([place])    Left pontine stroke ([place])    TIA (transient ischemic attack) 10/14/2014   Overactive bladder 10/14/2014   H/O: CVA (cerebrovascular accident) 10/14/2014   NECK PAIN, CHRONIC 10/29/2010   Essential hypertension 10/10/2009   INSOMNIA 09/15/2009   Pure hypercholesterolemia 11/28/2008   MENOPAUSAL SYNDROME 11/28/2008    Silvestre Mesi 07/31/2020, 10:54 AM  [facility] [address] [place], [place], [address] Phone: [phone]   Fax:  [phone]  Name: Robi Mitter Martinez MRN: [id number] Date of Birth:  Dec 21, 1940

## 2020-07-31 NOTE — Addendum Note (Signed)
Addended by: April Manson on: 07/31/2020 10:57 AM   Modules accepted: Orders

## 2020-08-02 ENCOUNTER — Other Ambulatory Visit: Payer: Self-pay

## 2020-08-02 ENCOUNTER — Encounter: Payer: Medicare PPO | Admitting: Physical Therapy

## 2020-08-02 ENCOUNTER — Ambulatory Visit (INDEPENDENT_AMBULATORY_CARE_PROVIDER_SITE_OTHER): Payer: Medicare PPO | Admitting: Physical Therapy

## 2020-08-02 DIAGNOSIS — M25562 Pain in left knee: Secondary | ICD-10-CM

## 2020-08-02 DIAGNOSIS — R600 Localized edema: Secondary | ICD-10-CM | POA: Diagnosis not present

## 2020-08-02 DIAGNOSIS — M25662 Stiffness of left knee, not elsewhere classified: Secondary | ICD-10-CM

## 2020-08-02 DIAGNOSIS — M6281 Muscle weakness (generalized): Secondary | ICD-10-CM | POA: Diagnosis not present

## 2020-08-02 NOTE — Therapy (Signed)
[facility] Physical Therapy [address] [place], [place], [address] Phone: [phone]   Fax:  [phone]  Physical Therapy Treatment  Patient Details  Name: Teresa Martinez MRN: [id number] Date of Birth: 03/22/1941 Referring Provider (PT): Ninfa Linden   Encounter Date: 08/02/2020   PT End of Session - 08/02/20 8676    Visit Number 11    Number of Visits 17    Date for PT Re-Evaluation 09/04/20    Authorization Type [employer], reauth sent 9/13, with 6 more visits until 10/8    Authorization - Visit Number 1    Authorization - Number of Visits 6    Progress Note Due on Visit 20    PT Start Time 1430    PT Stop Time 1518    PT Time Calculation (min) 48 min    Activity Tolerance Patient tolerated treatment well    Behavior During Therapy [facility] for tasks assessed/performed           Past Medical History:  Diagnosis Date  . Cervical spine degeneration    C5/C6  . Chronic right shoulder pain   . Hemiparesis affecting dominant side as late effect of cerebrovascular accident ([place]) 12/07/2014  . Hyperlipemia   . Hypertension   . Menopausal syndrome    [name] [place])  . Overactive bladder   . Statin intolerance     Past Surgical History:  Procedure Laterality Date  . ABDOMINAL HYSTERECTOMY     1985  . BAND HEMORRHOIDECTOMY     2010  . CHOLECYSTECTOMY     1950  . PAROTIDECTOMY     30 years ago    There were no vitals filed for this visit.   Subjective Assessment - 08/02/20 1508    Subjective relays her knee feels better today, the vaso really helped.    How long can you stand comfortably? less than 10 minutes    How long can you walk comfortably? depends    Pain Onset 1 to 4 weeks ago             [facility] Adult PT Treatment/Exercise - 08/02/20 0001      Neuro Re-ed    Neuro Re-ed Details  3 cones lateral stepping X 5 reps bilat, then 3 cone SL taps X 5 bilat(for taps needs min to mod A at times due to LOB)      Knee/Hip Exercises:  Stretches   Active Hamstring Stretch Both;3 reps;30 seconds    Quad Stretch Both;3 reps;20 seconds    Quad Stretch Limitations standing    Gastroc Stretch 30 seconds;Both;3 reps      Knee/Hip Exercises: Aerobic   Nustep Lvl 10 10 mins UE/LE      Knee/Hip Exercises: Machines for Strengthening   Cybex Knee Extension 25 lbs 3X12    Cybex Knee Flexion 35 lbs 3X12 bilat    Cybex Leg Press 100 lbs bilat push 3x10, then 75 lbs for SL presses 3X10ea side      Modalities   Modalities Vasopneumatic      Vasopneumatic   Number Minutes Vasopneumatic  10 minutes    Vasopnuematic Location  Knee    Vasopneumatic Pressure Medium    Vasopneumatic Temperature  34                       PT Long Term Goals - 07/31/20 1038      PT LONG TERM GOAL #1   Title Pt will be I and compliant with HEP. (target  for all goals 6 weeks 07/25/20)    Baseline met 9/1    Time 6    Period Weeks    Status Achieved      PT LONG TERM GOAL #2   Title She will increase Lt knee extension AROM to 3 deg or less from being straight.    Baseline now 4 deg    Status On-going      PT LONG TERM GOAL #3   Title Pt will improve Lt hip strength to overall 4+/5 and Lt knee strength to 5/5 to improve function.    Baseline met 9/1    Status Achieved      PT LONG TERM GOAL #4   Title Pt will be able to ambulate community distances at least 1000 ft on even and uneven surfaces and negotiate stairs without complaints.    Baseline still with pain walking longer distances    Status On-going      PT LONG TERM GOAL #5   Title Pt will improve 5TSTS test to 13 seconds or less to show improved balance and endurance    Baseline 8.8 sec on 9/1    Status Achieved                 Plan - 08/02/20 1511    Clinical Impression Statement Continued with strengthening and stretching for her Lt knee as well as overall dynamic balance. Less overall pain today. Insurance has authorized this visit and 5 more.    Personal  Factors and Comorbidities Comorbidity 3+    Comorbidities DDD lumbar stenosis, ITB syndrome right side, rt shoulder adhesive capsulitis, CVA rt Hemiparesis, HTN, L3-5 laminectomy 2019,    Examination-Activity Limitations Bend;Carry;Lift;Stand;Stairs;Squat;Locomotion Level    Examination-Participation Restrictions Cleaning;Shop;Laundry;Yard Work    Stability/Clinical Decision Making Stable/Uncomplicated    Rehab Potential Good    PT Frequency 2x / week   1-2   PT Duration 6 weeks   4-6 weeks   PT Treatment/Interventions ADLs/Self Care Home Management;Cryotherapy;Electrical Stimulation;Iontophoresis 58m/ml Dexamethasone;Moist Heat;Ultrasound;Gait training;Stair training;Functional mobility training;Therapeutic activities;Therapeutic exercise;Balance training;Neuromuscular re-education;Manual techniques;Passive range of motion;Dry needling;Joint Manipulations;Vasopneumatic Device;Taping    PT Next Visit Plan leg strength, balance, Static and dynamic movement coordination.    PT Home Exercise Plan Access Code: [email or web address]    Consulted and Agree with Plan of Care Patient           Patient will benefit from skilled therapeutic intervention in order to improve the following deficits and impairments:  Abnormal gait, Decreased activity tolerance, Decreased balance, Decreased mobility, Decreased endurance, Decreased range of motion, Decreased strength, Increased edema, Difficulty walking, Increased fascial restricitons, Impaired flexibility, Pain  Visit Diagnosis: Acute pain of left knee  Stiffness of left knee, not elsewhere classified  Muscle weakness (generalized)  Localized edema     Problem List Patient Active Problem List   Diagnosis Date Noted  . Preop testing 07/09/2018  . Degenerative lumbar spinal stenosis 05/12/2018  . Statin myopathy 08/29/2016  . Iliotibial band syndrome of right side 04/23/2016  . Acromioclavicular arthrosis 06/30/2015  . Left carpal tunnel syndrome 05/15/2015   . Adhesive capsulitis of right shoulder 02/06/2015  . Dysarthria due to cerebrovascular accident 01/09/2015  . Hemiparesis affecting dominant side as late effect of [place]vascular [place] 12/07[place]  . Spastic neurogenic bladder 10/31/2014  . Right rotator cuff tendonitis 10/19/2014  . Stenosis of cervical spine region   . Chronic right shoulder pain   . Cerebral infarction due to thrombosis of left middle cerebral artery ([place]   .  Left pontine stroke ([place])   . TIA (transient ischemic attack) 10/14/2014  . Overactive bladder 10/14/2014  . H/O: CVA (cerebrovascular accident) 10/14/2014  . NECK PAIN, CHRONIC 10/29/2010  . Essential hypertension 10/10/2009  . INSOMNIA 09/15/2009  . Pure hypercholesterolemia 11/28/2008  . MENOPAUSAL SYNDROME 11/28/2008    Silvestre Mesi 08/02/2020, 3:12 PM  [facility] Physical Therapy [address] [place], [place], [address] Phone: [phone]   Fax:  [phone]  Name: LAVINA RESOR MRN: [id number] Date of Birth: 08-10-1941

## 2020-08-03 ENCOUNTER — Telehealth: Payer: Self-pay | Admitting: Internal Medicine

## 2020-08-03 NOTE — Telephone Encounter (Signed)
Pt called to say she needs her last blood work faxed over to Dr. Man at [phone]. she is preparing for her colonoscopy and the Dr. Rosine Abe want to repeat labs that were already done  Please let the pt know if anything else is needed.  Her husband is on chemo and doesn't want to leave him to fill out a medical release form to get this information sent over.  Please advise

## 2020-08-03 NOTE — Telephone Encounter (Signed)
E-faxed

## 2020-08-09 ENCOUNTER — Ambulatory Visit (INDEPENDENT_AMBULATORY_CARE_PROVIDER_SITE_OTHER): Payer: Medicare PPO | Admitting: Orthopaedic Surgery

## 2020-08-09 ENCOUNTER — Encounter: Payer: Self-pay | Admitting: Orthopaedic Surgery

## 2020-08-09 VITALS — Ht 65.75 in | Wt 131.0 lb

## 2020-08-09 DIAGNOSIS — Z9889 Other specified postprocedural states: Secondary | ICD-10-CM

## 2020-08-09 DIAGNOSIS — M1712 Unilateral primary osteoarthritis, left knee: Secondary | ICD-10-CM

## 2020-08-09 NOTE — Progress Notes (Signed)
The patient is now over a week status post a left knee arthroscopy with a partial medial meniscectomy.  We did find cartilage loss medial compartment of her knee.  She is very active and can 79 year old female.  We are trying to treat the arthritis with just the arthroscopy.  A month ago we did place hyaluronic acid into the knee.  She does wear a knee brace.  She is also been going to physical therapy to strengthen her left knee.  On examination of the knee today there is a moderate effusion.  She is a very thin individual cells able to aspirate 30 cc of clear fluid from the knee.  I then placed a steroid injection in her knee which she tolerated well.  She will continue to work on strengthening of her quad muscles.  I have recommended a copper fit knee sleeve for her knee.  I would like to see her back in 4 weeks to see how she is doing overall.  All question concerns were answered and addressed.

## 2020-08-10 DIAGNOSIS — Z86010 Personal history of colonic polyps: Secondary | ICD-10-CM | POA: Diagnosis not present

## 2020-08-10 DIAGNOSIS — K5730 Diverticulosis of large intestine without perforation or abscess without bleeding: Secondary | ICD-10-CM | POA: Diagnosis not present

## 2020-08-10 DIAGNOSIS — Z1211 Encounter for screening for malignant neoplasm of colon: Secondary | ICD-10-CM | POA: Diagnosis not present

## 2020-08-14 ENCOUNTER — Ambulatory Visit: Payer: Medicare PPO | Admitting: Orthopaedic Surgery

## 2020-08-16 ENCOUNTER — Ambulatory Visit (INDEPENDENT_AMBULATORY_CARE_PROVIDER_SITE_OTHER): Payer: Medicare PPO | Admitting: Rehabilitative and Restorative Service Providers"

## 2020-08-16 ENCOUNTER — Other Ambulatory Visit: Payer: Self-pay

## 2020-08-16 ENCOUNTER — Encounter: Payer: Self-pay | Admitting: Rehabilitative and Restorative Service Providers"

## 2020-08-16 DIAGNOSIS — M6281 Muscle weakness (generalized): Secondary | ICD-10-CM

## 2020-08-16 DIAGNOSIS — R600 Localized edema: Secondary | ICD-10-CM | POA: Diagnosis not present

## 2020-08-16 DIAGNOSIS — M25562 Pain in left knee: Secondary | ICD-10-CM | POA: Diagnosis not present

## 2020-08-16 DIAGNOSIS — M25662 Stiffness of left knee, not elsewhere classified: Secondary | ICD-10-CM

## 2020-08-16 NOTE — Therapy (Signed)
[facility] Physical Therapy [address] [place], [place], [address] Phone: [phone]   Fax:  [phone]  Physical Therapy Treatment  Patient Details  Name: Teresa Martinez MRN: [id number] Date of Birth: 1941/03/31 Referring Provider (PT): Ninfa Linden   Encounter Date: 08/16/2020   PT End of Session - 08/16/20 1011    Visit Number 12    Number of Visits 17    Date for PT Re-Evaluation 09/04/20    Authorization Type [employer], reauth sent 9/13, with 6 more visits until 10/8    Authorization - Visit Number 2    Authorization - Number of Visits 6    Progress Note Due on Visit 20    PT Start Time 1007    PT Stop Time 1057    PT Time Calculation (min) 50 min    Activity Tolerance Patient tolerated treatment well    Behavior During Therapy [facility] for tasks assessed/performed           Past Medical History:  Diagnosis Date  . Cervical spine degeneration    C5/C6  . Chronic right shoulder pain   . Hemiparesis affecting dominant side as late effect of cerebrovascular accident ([place]) 12/07/2014  . Hyperlipemia   . Hypertension   . Menopausal syndrome    [name] [place])  . Overactive bladder   . Statin intolerance     Past Surgical History:  Procedure Laterality Date  . ABDOMINAL HYSTERECTOMY     1985  . BAND HEMORRHOIDECTOMY     2010  . CHOLECYSTECTOMY     1950  . PAROTIDECTOMY     30 years ago    There were no vitals filed for this visit.   Subjective Assessment - 08/16/20 1009    Subjective Pt. stated no pain upon arrival today but can feel "something is there".    How long can you stand comfortably? less than 10 minutes    How long can you walk comfortably? depends    Currently in Pain? No/denies    Pain Score 0-No pain    Pain Onset 1 to 4 weeks ago                             [facility] Adult PT Treatment/Exercise - 08/16/20 0001      Neuro Re-ed    Neuro Re-ed Details  3 cones lateral stepping x 6 bilateral, SL c  3 cone touches anterior, medial, lateral x 6 each bilateral, tandem ambulation in // bars 10 ft x 6 fwd      Knee/Hip Exercises: Stretches   Gastroc Stretch 30 seconds;Both;3 reps      Knee/Hip Exercises: Aerobic   Nustep Lvl 10 10 mins      Knee/Hip Exercises: Machines for Strengthening   Cybex Knee Extension 25 lbs 3X12    Cybex Knee Flexion 35 lbs 3X12 bilat    Cybex Leg Press 100 lbs bilat push 3x10, then 75 lbs for SL presses 3X10ea side      Vasopneumatic   Number Minutes Vasopneumatic  10 minutes    Vasopnuematic Location  Knee    Vasopneumatic Pressure Medium    Vasopneumatic Temperature  34                       PT Long Term Goals - 07/31/20 1038      PT LONG TERM GOAL #1   Title Pt will be I and compliant with HEP. (target  for all goals 6 weeks 07/25/20)    Baseline met 9/1    Time 6    Period Weeks    Status Achieved      PT LONG TERM GOAL #2   Title She will increase Lt knee extension AROM to 3 deg or less from being straight.    Baseline now 4 deg    Status On-going      PT LONG TERM GOAL #3   Title Pt will improve Lt hip strength to overall 4+/5 and Lt knee strength to 5/5 to improve function.    Baseline met 9/1    Status Achieved      PT LONG TERM GOAL #4   Title Pt will be able to ambulate community distances at least 1000 ft on even and uneven surfaces and negotiate stairs without complaints.    Baseline still with pain walking longer distances    Status On-going      PT LONG TERM GOAL #5   Title Pt will improve 5TSTS test to 13 seconds or less to show improved balance and endurance    Baseline 8.8 sec on 9/1    Status Achieved                 Plan - 08/16/20 1040    Clinical Impression Statement Pt. noted complaints c Lt knee anteriorly c SL leg press today.  Static and dynamic balance continue to show deficits at this time, Lt greater than Rt.    Personal Factors and Comorbidities Comorbidity 3+    Comorbidities DDD lumbar  stenosis, ITB syndrome right side, rt shoulder adhesive capsulitis, CVA rt Hemiparesis, HTN, L3-5 laminectomy 2019,    Examination-Activity Limitations Bend;Carry;Lift;Stand;Stairs;Squat;Locomotion Level    Examination-Participation Restrictions Cleaning;Shop;Laundry;Yard Work    Stability/Clinical Decision Making Stable/Uncomplicated    Rehab Potential Good    PT Frequency 2x / week   1-2   PT Duration 6 weeks   4-6 weeks   PT Treatment/Interventions ADLs/Self Care Home Management;Cryotherapy;Electrical Stimulation;Iontophoresis 59m/ml Dexamethasone;Moist Heat;Ultrasound;Gait training;Stair training;Functional mobility training;Therapeutic activities;Therapeutic exercise;Balance training;Neuromuscular re-education;Manual techniques;Passive range of motion;Dry needling;Joint Manipulations;Vasopneumatic Device;Taping    PT Next Visit Plan leg strength, balance, Static and dynamic movement coordination.    PT Home Exercise Plan Access Code: [email or web address]    Consulted and Agree with Plan of Care Patient           Patient will benefit from skilled therapeutic intervention in order to improve the following deficits and impairments:  Abnormal gait, Decreased activity tolerance, Decreased balance, Decreased mobility, Decreased endurance, Decreased range of motion, Decreased strength, Increased edema, Difficulty walking, Increased fascial restricitons, Impaired flexibility, Pain  Visit Diagnosis: Acute pain of left knee  Stiffness of left knee, not elsewhere classified  Muscle weakness (generalized)  Localized edema     Problem List Patient Active Problem List   Diagnosis Date Noted  . Preop testing 07/09/2018  . Degenerative lumbar spinal stenosis 05/12/2018  . Statin myopathy 08/29/2016  . Iliotibial band syndrome of right side 04/23/2016  . Acromioclavicular arthrosis 06/30/2015  . Left carpal tunnel syndrome 05/15/2015  . Adhesive capsulitis of right shoulder 02/06/2015  . Dysarthria  due to cerebrovascular accident 01/09/2015  . Hemiparesis affecting dominant side as late effect of cerebrovascular accident ([place] 12/07/2014  . Spastic neurogenic bladder 10/31/2014  . Right rotator cuff tendonitis 10/19/2014  . Stenosis of cervical spine region   . Chronic right shoulder pain   . Cerebral infarction due to thrombosis of left middle cerebral artery ([place]   .  Left pontine stroke ([place])   . TIA (transient ischemic attack) 10/14/2014  . Overactive bladder 10/14/2014  . H/O: CVA (cerebrovascular accident) 10/14/2014  . NECK PAIN, CHRONIC 10/29/2010  . Essential hypertension 10/10/2009  . INSOMNIA 09/15/2009  . Pure hypercholesterolemia 11/28/2008  . MENOPAUSAL SYNDROME 11/28/2008    Scot Jun, PT, DPT, OCS, ATC 08/16/20  10:45 AM    [facility] Physical Therapy [address] [place], [place], [address] Phone: [phone]   Fax:  [phone]  Name: SHATAYA WINKLES MRN: [id number] Date of Birth: 01-21-1941

## 2020-08-18 ENCOUNTER — Ambulatory Visit (INDEPENDENT_AMBULATORY_CARE_PROVIDER_SITE_OTHER): Payer: Medicare PPO | Admitting: Rehabilitative and Restorative Service Providers"

## 2020-08-18 ENCOUNTER — Encounter: Payer: Self-pay | Admitting: Rehabilitative and Restorative Service Providers"

## 2020-08-18 ENCOUNTER — Other Ambulatory Visit: Payer: Self-pay

## 2020-08-18 DIAGNOSIS — R600 Localized edema: Secondary | ICD-10-CM

## 2020-08-18 DIAGNOSIS — M6281 Muscle weakness (generalized): Secondary | ICD-10-CM

## 2020-08-18 DIAGNOSIS — M25562 Pain in left knee: Secondary | ICD-10-CM

## 2020-08-18 DIAGNOSIS — M25662 Stiffness of left knee, not elsewhere classified: Secondary | ICD-10-CM | POA: Diagnosis not present

## 2020-08-18 NOTE — Therapy (Signed)
[facility] Physical Therapy [address] [place], [place], [address] Phone: [phone]   Fax:  [phone]  Physical Therapy Treatment  Patient Details  Name: Teresa Martinez MRN: [id number] Date of Birth: Oct 02, 1941 Referring Provider (PT): Ninfa Linden   Encounter Date: 08/18/2020   PT End of Session - 08/18/20 1056    Visit Number 13    Number of Visits 17    Date for PT Re-Evaluation 09/04/20    Authorization Type [employer], reauth sent 9/13, with 6 more visits until 10/8    Authorization - Visit Number 3    Authorization - Number of Visits 6    Progress Note Due on Visit 20    PT Start Time 1052    PT Stop Time 1135    PT Time Calculation (min) 43 min    Activity Tolerance Patient tolerated treatment well    Behavior During Therapy [facility] for tasks assessed/performed           Past Medical History:  Diagnosis Date  . Cervical spine degeneration    C5/C6  . Chronic right shoulder pain   . Hemiparesis affecting dominant side as late effect of cerebrovascular accident ([place]) 12/07/2014  . Hyperlipemia   . Hypertension   . Menopausal syndrome    [name] [place])  . Overactive bladder   . Statin intolerance     Past Surgical History:  Procedure Laterality Date  . ABDOMINAL HYSTERECTOMY     1985  . BAND HEMORRHOIDECTOMY     2010  . CHOLECYSTECTOMY     1950  . PAROTIDECTOMY     30 years ago    There were no vitals filed for this visit.   Subjective Assessment - 08/18/20 1054    Subjective Pt. stated feeling some inferior to Lt knee soreness that she notices c walking and touching on Lt LE.    How long can you stand comfortably? less than 10 minutes    How long can you walk comfortably? depends    Pain Score --   mild   Pain Location Knee    Pain Orientation Left;Medial;Lower   proximal medial tibia   Pain Type Surgical pain    Pain Onset 1 to 4 weeks ago    Pain Frequency Occasional    Aggravating Factors  walking    Pain  Relieving Factors rest, not walking on it                             [facility] Adult PT Treatment/Exercise - 08/18/20 0001      Neuro Re-ed    Neuro Re-ed Details  3 cone SL touch anterior, anterior/medial, anterior/lateral x 8 bilateral,  tandem stance 1 min x 2 each      Knee/Hip Exercises: Aerobic   Nustep Lvl 10 10 mins      Knee/Hip Exercises: Machines for Strengthening   Cybex Knee Extension 35 lbs 3 x 12 bilateral    Cybex Knee Flexion 35 lbs 3X12 bilat    Cybex Leg Press 100 lbs bilat push 3x15, then 75 lbs for SL presses 3X10ea side      Vasopneumatic   Number Minutes Vasopneumatic  5 minutes    Vasopnuematic Location  Knee    Vasopneumatic Pressure Medium    Vasopneumatic Temperature  34                       PT Long Term Goals -  07/31/20 1038      PT LONG TERM GOAL #1   Title Pt will be I and compliant with HEP. (target for all goals 6 weeks 07/25/20)    Baseline met 9/1    Time 6    Period Weeks    Status Achieved      PT LONG TERM GOAL #2   Title She will increase Lt knee extension AROM to 3 deg or less from being straight.    Baseline now 4 deg    Status On-going      PT LONG TERM GOAL #3   Title Pt will improve Lt hip strength to overall 4+/5 and Lt knee strength to 5/5 to improve function.    Baseline met 9/1    Status Achieved      PT LONG TERM GOAL #4   Title Pt will be able to ambulate community distances at least 1000 ft on even and uneven surfaces and negotiate stairs without complaints.    Baseline still with pain walking longer distances    Status On-going      PT LONG TERM GOAL #5   Title Pt will improve 5TSTS test to 13 seconds or less to show improved balance and endurance    Baseline 8.8 sec on 9/1    Status Achieved                 Plan - 08/18/20 1107    Clinical Impression Statement Mild palpation tenderness near pes anserine region but not painful c hamstring activation.  Pt. reported symptom in  area c SL leg press loading.    Personal Factors and Comorbidities Comorbidity 3+    Comorbidities DDD lumbar stenosis, ITB syndrome right side, rt shoulder adhesive capsulitis, CVA rt Hemiparesis, HTN, L3-5 laminectomy 2019,    Examination-Activity Limitations Bend;Carry;Lift;Stand;Stairs;Squat;Locomotion Level    Examination-Participation Restrictions Cleaning;Shop;Laundry;Yard Work    Stability/Clinical Decision Making Stable/Uncomplicated    Rehab Potential Good    PT Frequency 2x / week   1-2   PT Duration 6 weeks   4-6 weeks   PT Treatment/Interventions ADLs/Self Care Home Management;Cryotherapy;Electrical Stimulation;Iontophoresis 80m/ml Dexamethasone;Moist Heat;Ultrasound;Gait training;Stair training;Functional mobility training;Therapeutic activities;Therapeutic exercise;Balance training;Neuromuscular re-education;Manual techniques;Passive range of motion;Dry needling;Joint Manipulations;Vasopneumatic Device;Taping    PT Next Visit Plan leg strength, balance, Static and dynamic movement coordination.    PT Home Exercise Plan Access Code: [email or web address]    Consulted and Agree with Plan of Care Patient           Patient will benefit from skilled therapeutic intervention in order to improve the following deficits and impairments:  Abnormal gait, Decreased activity tolerance, Decreased balance, Decreased mobility, Decreased endurance, Decreased range of motion, Decreased strength, Increased edema, Difficulty walking, Increased fascial restricitons, Impaired flexibility, Pain  Visit Diagnosis: Acute pain of left knee  Stiffness of left knee, not elsewhere classified  Muscle weakness (generalized)  Localized edema     Problem List Patient Active Problem List   Diagnosis Date Noted  . Preop testing 07/09/2018  . Degenerative lumbar spinal stenosis 05/12/2018  . Statin myopathy 08/29/2016  . Iliotibial band syndrome of right side 04/23/2016  . Acromioclavicular arthrosis 06/30/2015    . Left carpal tunnel syndrome 05/15/2015  . Adhesive capsulitis of right shoulder 02/06/2015  . Dysarthria due to cerebrovascular accident 01/09/2015  . Hemiparesis affecting dominant side as late effect of [place]vascular accident ([place] 12/07/2014  . Spastic neurogenic bladder 10/31/2014  . Right rotator cuff tendonitis 10/19/2014  . Stenosis of cervical spine region   .  Chronic right shoulder pain   . Cerebral infarction due to thrombosis of left middle cerebral artery ([place])   . Left pontine stroke ([place])   . TIA (transient ischemic attack) 10/14/2014  . Overactive bladder 10/14/2014  . H/O: CVA (cerebrovascular accident) 10/14/2014  . NECK PAIN, CHRONIC 10/29/2010  . Essential hypertension 10/10/2009  . INSOMNIA 09/15/2009  . Pure hypercholesterolemia 11/28/2008  . MENOPAUSAL SYNDROME 11/28/2008    Scot Jun, PT, DPT, OCS, ATC 08/18/20  11:39 AM     [facility] Physical Therapy [address] [place], [place], [address] Phone: [phone]   Fax:  [phone]  Name: NOURA PURPURA MRN: [id number] Date of Birth: February 14, 1941

## 2020-08-23 ENCOUNTER — Ambulatory Visit (INDEPENDENT_AMBULATORY_CARE_PROVIDER_SITE_OTHER): Payer: Medicare PPO | Admitting: Physical Therapy

## 2020-08-23 ENCOUNTER — Other Ambulatory Visit: Payer: Self-pay

## 2020-08-23 DIAGNOSIS — M6281 Muscle weakness (generalized): Secondary | ICD-10-CM

## 2020-08-23 DIAGNOSIS — R600 Localized edema: Secondary | ICD-10-CM | POA: Diagnosis not present

## 2020-08-23 DIAGNOSIS — M25662 Stiffness of left knee, not elsewhere classified: Secondary | ICD-10-CM | POA: Diagnosis not present

## 2020-08-23 DIAGNOSIS — M25562 Pain in left knee: Secondary | ICD-10-CM

## 2020-08-23 NOTE — Therapy (Signed)
[facility] [address] [place], [place], [address] Phone: [phone]   Fax:  [phone]  Physical Therapy Treatment  Patient Details  Name: Teresa Martinez MRN: [id number] Date of Birth: Mar 05, 1941 Referring Provider (PT): Ninfa Linden   Encounter Date: 08/23/2020   PT End of Session - 08/23/20 1141    Visit Number 14    Number of Visits 17    Date for PT Re-Evaluation 09/04/20    Authorization Type [employer], reauth sent 9/13, with 6 more visits until 10/8    Authorization - Visit Number 4    Authorization - Number of Visits 6    Progress Note Due on Visit 20    PT Start Time 1100    PT Stop Time 1148    PT Time Calculation (min) 48 min    Activity Tolerance Patient tolerated treatment well    Behavior During Therapy [facility] for tasks assessed/performed           Past Medical History:  Diagnosis Date  . Cervical spine degeneration    C5/C6  . Chronic right shoulder pain   . Hemiparesis affecting dominant side as late effect of cerebrovascular accident ([place]) 12/07/2014  . Hyperlipemia   . Hypertension   . Menopausal syndrome    [name] [employer])  . Overactive bladder   . Statin intolerance     Past Surgical History:  Procedure Laterality Date  . ABDOMINAL HYSTERECTOMY     1985  . BAND HEMORRHOIDECTOMY     2010  . CHOLECYSTECTOMY     1950  . PAROTIDECTOMY     30 years ago    There were no vitals filed for this visit.   Subjective Assessment - 08/23/20 1127    Subjective relays some medial knee pain, 2/10 in her Lt knee              [facility] PT Assessment - 08/23/20 0001      Assessment   Medical Diagnosis s/p left knee arthroscopy    Referring Provider (PT) Ninfa Linden    Onset Date/Surgical Date 05/25/20      Strength   Right Hip Flexion 4+/5    Right Hip ABduction 4+/5    Left Hip Flexion 4+/5    Left Hip ABduction 4+/5    Right Knee Flexion 5/5    Right Knee Extension 5/5    Left Knee Flexion 5/5     Left Knee Extension 5/5                         [facility] Adult PT Treatment/Exercise - 08/23/20 0001      Neuro Re-ed    Neuro Re-ed Details  3 cone SL touch anterior, anterior/medial, anterior/lateral x 8 bilateral,  tandem walk and march walk at counter up/down X 3 ea      Knee/Hip Exercises: Clinical research associate 30 seconds;Both;3 reps      Knee/Hip Exercises: Aerobic   Nustep Lvl 10 10 mins      Knee/Hip Exercises: Machines for Strengthening   Cybex Knee Extension 35 lbs 3 x 15 bilateral    Cybex Knee Flexion 35 lbs 3X15 bilat    Cybex Leg Press 106 lbs bilat push 3x10, then 75 lbs for SL presses 3X10ea side      Knee/Hip Exercises: Standing   Lateral Step Up Both;10 reps;Hand Hold: 0;Step Height: 6";Hand Hold: 2    Forward Step Up Both;15 reps;Hand Hold: 0;Step Height: 6"  Vasopneumatic   Number Minutes Vasopneumatic  10 minutes    Vasopnuematic Location  Knee    Vasopneumatic Pressure Medium    Vasopneumatic Temperature  34                       PT Long Term Goals - 07/31/20 1038      PT LONG TERM GOAL #1   Title Pt will be I and compliant with HEP. (target for all goals 6 weeks 07/25/20)    Baseline met 9/1    Time 6    Period Weeks    Status Achieved      PT LONG TERM GOAL #2   Title She will increase Lt knee extension AROM to 3 deg or less from being straight.    Baseline now 4 deg    Status On-going      PT LONG TERM GOAL #3   Title Pt will improve Lt hip strength to overall 4+/5 and Lt knee strength to 5/5 to improve function.    Baseline met 9/1    Status Achieved      PT LONG TERM GOAL #4   Title Pt will be able to ambulate community distances at least 1000 ft on even and uneven surfaces and negotiate stairs without complaints.    Baseline still with pain walking longer distances    Status On-going      PT LONG TERM GOAL #5   Title Pt will improve 5TSTS test to 13 seconds or less to show improved balance and  endurance    Baseline 8.8 sec on 9/1    Status Achieved                 Plan - 08/23/20 1143    Clinical Impression Statement she was having anterior medial knee pain upon arrival but relays no pain after session following exercises and vaso. She has 3 visits left on current POC and PT anticipates ready for discharge then.    Personal Factors and Comorbidities Comorbidity 3+    Comorbidities DDD lumbar stenosis, ITB syndrome right side, rt shoulder adhesive capsulitis, CVA rt Hemiparesis, HTN, L3-5 laminectomy 2019,    Examination-Activity Limitations Bend;Carry;Lift;Stand;Stairs;Squat;Locomotion Level    Examination-Participation Restrictions Cleaning;Shop;Laundry;Yard Work    Stability/Clinical Decision Making Stable/Uncomplicated    Rehab Potential Good    PT Frequency 2x / week   1-2   PT Duration 6 weeks   4-6 weeks   PT Treatment/Interventions ADLs/Self Care Home Management;Cryotherapy;Electrical Stimulation;Iontophoresis 25m/ml Dexamethasone;Moist Heat;Ultrasound;Gait training;Stair training;Functional mobility training;Therapeutic activities;Therapeutic exercise;Balance training;Neuromuscular re-education;Manual techniques;Passive range of motion;Dry needling;Joint Manipulations;Vasopneumatic Device;Taping    PT Next Visit Plan leg strength, balance, Static and dynamic movement coordination.    PT Home Exercise Plan Access Code: [email or web address]    Consulted and Agree with Plan of Care Patient           Patient will benefit from skilled therapeutic intervention in order to improve the following deficits and impairments:  Abnormal gait, Decreased activity tolerance, Decreased balance, Decreased mobility, Decreased endurance, Decreased range of motion, Decreased strength, Increased edema, Difficulty walking, Increased fascial restricitons, Impaired flexibility, Pain  Visit Diagnosis: Acute pain of left knee  Stiffness of left knee, not elsewhere classified  Muscle weakness  (generalized)  Localized edema     Problem List Patient Active Problem List   Diagnosis Date Noted  . Preop testing 07/09/2018  . Degenerative lumbar spinal stenosis 05/12/2018  . Statin myopathy 08/29/2016  . Iliotibial band syndrome of  right side 04/23/2016  . Acromioclavicular arthrosis 06/30/2015  . Left carpal tunnel syndrome 05/15/2015  . Adhesive capsulitis of right shoulder 02/06/2015  . Dysarthria due to cerebrovascular accident 01/09/2015  . Hemiparesis affecting dominant side as late effect of cerebrovascular accident ([place]) 12/07/2014  . Spastic neurogenic bladder 10/31/2014  . Right rotator cuff tendonitis 10/19/2014  . Stenosis of cervical spine region   . Chronic right shoulder pain   . Cerebral infarction due to thrombosis of left middle cerebral artery ([place])   . Left pontine stroke ([place])   . TIA (transient ischemic attack) 10/14/2014  . Overactive bladder 10/14/2014  . H/O: CVA (cerebrovascular accident) 10/14/2014  . NECK PAIN, CHRONIC 10/29/2010  . Essential hypertension 10/10/2009  . INSOMNIA 09/15/2009  . Pure hypercholesterolemia 11/28/2008  . MENOPAUSAL SYNDROME 11/28/2008    Silvestre Mesi 08/23/2020, 11:51 AM  [facility] Physical Therapy [address] [place], [place], [address] Phone: [phone]   Fax:  [phone]  Name: Teresa Martinez MRN: [id number] Date of Birth: 20-Feb-1941

## 2020-08-31 DIAGNOSIS — Z961 Presence of intraocular lens: Secondary | ICD-10-CM | POA: Diagnosis not present

## 2020-08-31 DIAGNOSIS — H04123 Dry eye syndrome of bilateral lacrimal glands: Secondary | ICD-10-CM | POA: Diagnosis not present

## 2020-09-07 ENCOUNTER — Ambulatory Visit (INDEPENDENT_AMBULATORY_CARE_PROVIDER_SITE_OTHER): Payer: Medicare PPO | Admitting: Orthopaedic Surgery

## 2020-09-07 DIAGNOSIS — M1712 Unilateral primary osteoarthritis, left knee: Secondary | ICD-10-CM

## 2020-09-07 DIAGNOSIS — Z9889 Other specified postprocedural states: Secondary | ICD-10-CM

## 2020-09-07 NOTE — Progress Notes (Signed)
The patient is an 79 year old female who is 40 months status post a left knee arthroscopy.  Was found medial compartment arthritic changes and a medial meniscal tear.  We have placed hyaluronic acid in her knee and we have aspirated fluid from the knee.  She is now wearing a copper fit knee brace.  She is doing well overall.  She does ambulate with a cane.  On examination of her left knee there is still some fluid in the knee but not enough to consider aspiration.  She is having medial joint line tenderness and tenderness over the pes bursa area.  Overall she is got good range of motion of that knee and it feels stable.  Since she is doing so well we will see her back in 3 months for repeat exam and considering a steroid injection and ordering hyaluronic acid or just ordering hyaluronic acid at that visit.  All question concerns were answered addressed.  She has 3 more physical therapy visits that she will complete as well.

## 2020-09-12 ENCOUNTER — Other Ambulatory Visit: Payer: Self-pay

## 2020-09-12 ENCOUNTER — Ambulatory Visit (INDEPENDENT_AMBULATORY_CARE_PROVIDER_SITE_OTHER): Payer: Medicare PPO | Admitting: Physical Therapy

## 2020-09-12 DIAGNOSIS — M6281 Muscle weakness (generalized): Secondary | ICD-10-CM

## 2020-09-12 DIAGNOSIS — M25562 Pain in left knee: Secondary | ICD-10-CM

## 2020-09-12 DIAGNOSIS — M25662 Stiffness of left knee, not elsewhere classified: Secondary | ICD-10-CM | POA: Diagnosis not present

## 2020-09-12 DIAGNOSIS — R600 Localized edema: Secondary | ICD-10-CM | POA: Diagnosis not present

## 2020-09-12 NOTE — Therapy (Signed)
[facility] [address] [place], [place], [address] Phone: [phone]   Fax:  [phone]  Physical Therapy Treatment  Patient Details  Name: Teresa Martinez MRN: [id number] Date of Birth: 09-Aug-1941 Referring Provider (PT): Ninfa Linden   Encounter Date: 09/12/2020   PT End of Session - 09/12/20 1011    Visit Number 15    Number of Visits 17    Date for PT Re-Evaluation 10/10/20    Authorization Type [employer], reauth sent 9/13, with 6 more visits until 10/8, submitted last request to get dates extended to 11/23 for her last 3 remaining    Authorization - Visit Number 4    Authorization - Number of Visits 6    Progress Note Due on Visit 20    PT Start Time 0930    PT Stop Time 1018    PT Time Calculation (min) 48 min    Activity Tolerance Patient tolerated treatment well    Behavior During Therapy [facility] for tasks assessed/performed           Past Medical History:  Diagnosis Date  . Cervical spine degeneration    C5/C6  . Chronic right shoulder pain   . Hemiparesis affecting dominant side as late effect of cerebrovascular accident ([place]) 12/07/2014  . Hyperlipemia   . Hypertension   . Menopausal syndrome    [name] [place])  . Overactive bladder   . Statin intolerance     Past Surgical History:  Procedure Laterality Date  . ABDOMINAL HYSTERECTOMY     1985  . BAND HEMORRHOIDECTOMY     2010  . CHOLECYSTECTOMY     1950  . PAROTIDECTOMY     30 years ago    There were no vitals filed for this visit.   Subjective Assessment - 09/12/20 1010    Subjective relays MD was pleased at follow up and wants her to finish up her last 3 remaining PT visits then is okay to discharge. She rates 1/10 knee pain today    How long can you stand comfortably? less than 10 minutes    How long can you walk comfortably? depends    Pain Onset 1 to 4 weeks ago              [facility] PT Assessment - 09/12/20 0001      Strength   Right Hip  Flexion 4+/5    Right Hip ABduction 4+/5    Left Hip Flexion 4+/5    Left Hip ABduction 4+/5    Right Knee Flexion 5/5    Right Knee Extension 5/5    Left Knee Flexion 5/5    Left Knee Extension 5/5                         [facility] Adult PT Treatment/Exercise - 09/12/20 0001      Neuro Re-ed    Neuro Re-ed Details  in bars up/down 3 trips without UE support for march walking, retrowalking, tandem walking, sidestepping on foam beam (4 trips with this)      Knee/Hip Exercises: Stretches   Press photographer 30 seconds;Both;3 reps      Knee/Hip Exercises: Aerobic   Nustep Lvl 10 10 mins      Knee/Hip Exercises: Machines for Strengthening   Cybex Knee Extension 35 lbs 3 x 15 bilateral    Cybex Knee Flexion 35 lbs 3X15 bilat    Cybex Leg Press 106 lbs bilat push 3x10, then 75 lbs for  SL presses 3X10ea side      Knee/Hip Exercises: Standing   Lateral Step Up 10 reps;Both;Hand Hold: 0;Step Height: 6"    Lateral Step Up Limitations SBA    Forward Step Up Both;10 reps;Hand Hold: 0;Step Height: 6"    Forward Step Up Limitations SBA      Vasopneumatic   Number Minutes Vasopneumatic  10 minutes    Vasopnuematic Location  Knee    Vasopneumatic Pressure Medium    Vasopneumatic Temperature  34                       PT Long Term Goals - 07/31/20 1038      PT LONG TERM GOAL #1   Title Pt will be I and compliant with HEP. (target for all goals 6 weeks 07/25/20)    Baseline met 9/1    Time 6    Period Weeks    Status Achieved      PT LONG TERM GOAL #2   Title She will increase Lt knee extension AROM to 3 deg or less from being straight.    Baseline now 4 deg    Status On-going      PT LONG TERM GOAL #3   Title Pt will improve Lt hip strength to overall 4+/5 and Lt knee strength to 5/5 to improve function.    Baseline met 9/1    Status Achieved      PT LONG TERM GOAL #4   Title Pt will be able to ambulate community distances at least 1000 ft on even and  uneven surfaces and negotiate stairs without complaints.    Baseline still with pain walking longer distances    Status On-going      PT LONG TERM GOAL #5   Title Pt will improve 5TSTS test to 13 seconds or less to show improved balance and endurance    Baseline 8.8 sec on 9/1    Status Achieved                 Plan - 09/12/20 1013    Clinical Impression Statement Pain levels are doing good. MD is in agreement to discharge after her 2 last remaining visits and [employer] form was filled out and resubmitted to extend the dates to allow for these.    Personal Factors and Comorbidities Comorbidity 3+    Comorbidities DDD lumbar stenosis, ITB syndrome right side, rt shoulder adhesive capsulitis, CVA rt Hemiparesis, HTN, L3-5 laminectomy 2019,    Examination-Activity Limitations Bend;Carry;Lift;Stand;Stairs;Squat;Locomotion Level    Examination-Participation Restrictions Cleaning;Shop;Laundry;Yard Work    Stability/Clinical Decision Making Stable/Uncomplicated    Rehab Potential Good    PT Frequency 2x / week   1-2   PT Duration 6 weeks   4-6 weeks   PT Treatment/Interventions ADLs/Self Care Home Management;Cryotherapy;Electrical Stimulation;Iontophoresis 4m/ml Dexamethasone;Moist Heat;Ultrasound;Gait training;Stair training;Functional mobility training;Therapeutic activities;Therapeutic exercise;Balance training;Neuromuscular re-education;Manual techniques;Passive range of motion;Dry needling;Joint Manipulations;Vasopneumatic Device;Taping    PT Next Visit Plan leg strength, balance, Static and dynamic movement coordination.    PT Home Exercise Plan Access Code: [email or web address]    Consulted and Agree with Plan of Care Patient           Patient will benefit from skilled therapeutic intervention in order to improve the following deficits and impairments:  Abnormal gait, Decreased activity tolerance, Decreased balance, Decreased mobility, Decreased endurance, Decreased range of motion, Decreased  strength, Increased edema, Difficulty walking, Increased fascial restricitons, Impaired flexibility, Pain  Visit Diagnosis: Acute pain of  left knee  Stiffness of left knee, not elsewhere classified  Muscle weakness (generalized)  Localized edema     Problem List Patient Active Problem List   Diagnosis Date Noted  . Preop testing 07/09/2018  . Degenerative lumbar spinal stenosis 05/12/2018  . Statin myopathy 08/29/2016  . Iliotibial band syndrome of right side 04/23/2016  . Acromioclavicular arthrosis 06/30/2015  . Left carpal tunnel syndrome 05/15/2015  . Adhesive capsulitis of right shoulder 02/06/2015  . Dysarthria due to cerebrovascular accident 01/09/2015  . Hemiparesis affecting dominant side as late effect of cerebrovascular accident ([place]) 12/07/2014  . Spastic neurogenic bladder 10/31/2014  . Right rotator cuff tendonitis 10/19/2014  . Stenosis of cervical spine region   . Chronic right shoulder pain   . Cerebral infarction due to thrombosis of left middle cerebral artery ([place])   . Left pontine stroke ([place])   . TIA (transient ischemic attack) 10/14/2014  . Overactive bladder 10/14/2014  . H/O: CVA (cerebrovascular accident) 10/14/2014  . NECK PAIN, CHRONIC 10/29/2010  . Essential hypertension 10/10/2009  . INSOMNIA 09/15/2009  . Pure hypercholesterolemia 11/28/2008  . MENOPAUSAL SYNDROME 11/28/2008    Teresa Martinez 09/12/2020, 10:17 AM  [facility] Therapy [address] [place], [place], [address] Phone: [phone]   Fax:  [phone]  Name: Teresa Martinez MRN: [id number] Date of Birth: [birth year]

## 2020-09-15 ENCOUNTER — Other Ambulatory Visit: Payer: Self-pay

## 2020-09-15 ENCOUNTER — Ambulatory Visit (INDEPENDENT_AMBULATORY_CARE_PROVIDER_SITE_OTHER): Payer: Medicare PPO | Admitting: Internal Medicine

## 2020-09-15 ENCOUNTER — Encounter: Payer: Medicare PPO | Admitting: Rehabilitative and Restorative Service Providers"

## 2020-09-15 ENCOUNTER — Encounter: Payer: Self-pay | Admitting: Internal Medicine

## 2020-09-15 VITALS — BP 130/80 | HR 86 | Temp 98.3°F | Wt 134.3 lb

## 2020-09-15 DIAGNOSIS — I10 Essential (primary) hypertension: Secondary | ICD-10-CM

## 2020-09-15 DIAGNOSIS — G4700 Insomnia, unspecified: Secondary | ICD-10-CM

## 2020-09-15 MED ORDER — SERTRALINE HCL 25 MG PO TABS: 25.0000 mg | ORAL_TABLET | Freq: Every day | ORAL | 1 refills | Status: DC

## 2020-09-15 NOTE — Progress Notes (Signed)
Established Patient Office Visit     This visit occurred during the SARS-CoV-2 public health emergency.  Safety protocols were in place, including screening questions prior to the visit, additional usage of staff PPE, and extensive cleaning of exam room while observing appropriate contact time as indicated for disinfecting solutions.    CC/Reason for Visit: Discuss insomnia, stress, fatigue  HPI: Teresa Martinez is a 79 y.o. female who is coming in today for the above mentioned reasons.  Her husband has been diagnosed with pancreatic cancer and has not been doing well.  This has her very stressed.  She has noticed increased fatigue and she is barely sleeping at night.  In total she only sleeps about 2-1/2 to 3 hours per night, "thoughts are racing through my head".  Then she'll find herself nodding off throughout the day because she is so tired.   Past Medical/Surgical History: Past Medical History:  Diagnosis Date  . Cervical spine degeneration    C5/C6  . Chronic right shoulder pain   . Hemiparesis affecting dominant side as late effect of cerebrovascular accident ([facility]) 12/07/2014  . Hyperlipemia   . Hypertension   . Menopausal syndrome    [name])  . Overactive bladder   . Statin intolerance     Past Surgical History:  Procedure Laterality Date  . ABDOMINAL HYSTERECTOMY     1985  . BAND HEMORRHOIDECTOMY     2010  . CHOLECYSTECTOMY     1950  . PAROTIDECTOMY     30 years ago    Social History:  reports that she has never smoked. She has never used smokeless tobacco. She reports that she does not drink alcohol and does not use drugs.  Allergies: Allergies  Allergen Reactions  . Statins Other (See Comments)    Muscle pain and unable to walk Muscle pain and unable to walk  . Latex Other (See Comments)    Burns her skin    Family History:  Family History  Problem Relation Age of Onset  . Heart failure Father   . Diabetes Mother   .  Hypertension Mother   . Stroke Mother   . Cirrhosis Sister   . Diabetes Sister   . Hyperlipidemia Brother   . Hypertension Brother   . Stroke Brother   . Diabetes Brother      Current Outpatient Medications:  .  aspirin EC 325 MG EC tablet, Take 1 tablet (325 mg total) by mouth daily., Disp: 100 tablet, Rfl: 1 .  conjugated estrogens (PREMARIN) vaginal cream, Place 0.5 g vaginally., Disp: , Rfl:  .  diazepam (VALIUM) 5 MG tablet, TAKE ONE TAB ONE HOUR PRIOR TO MRI REPEAT AS NEEDED #2 . ZERO REFILLS, Disp: 2 tablet, Rfl: 0 .  ezetimibe (ZETIA) 10 MG tablet, TAKE 1 TABLET(10 MG) BY MOUTH DAILY, Disp: 90 tablet, Rfl: 1 .  fesoterodine (TOVIAZ) 4 MG TB24 tablet, TAKE 1 TABLET(4 MG) BY MOUTH DAILY, Disp: 90 tablet, Rfl: 1 .  lisinopril (ZESTRIL) 10 MG tablet, TAKE 1 TABLET(10 MG) BY MOUTH DAILY, Disp: 90 tablet, Rfl: 1 .  HYDROcodone-acetaminophen (NORCO/VICODIN) 5-325 MG tablet, Take 1-2 tablets by mouth every 6 (six) hours as needed for moderate pain., Disp: 30 tablet, Rfl: 0 .  sertraline (ZOLOFT) 25 MG tablet, Take 1 tablet (25 mg total) by mouth daily., Disp: 90 tablet, Rfl: 1  Review of Systems:  Constitutional: Denies fever, chills, diaphoresis, appetite change. HEENT: Denies photophobia, eye pain, redness, hearing loss, ear  pain, congestion, sore throat, rhinorrhea, sneezing, mouth sores, trouble swallowing, neck pain, neck stiffness and tinnitus.   Respiratory: Denies SOB, DOE, cough, chest tightness,  and wheezing.   Cardiovascular: Denies chest pain, palpitations and leg swelling.  Gastrointestinal: Denies nausea, vomiting, abdominal pain, diarrhea, constipation, blood in stool and abdominal distention.  Genitourinary: Denies dysuria, urgency, frequency, hematuria, flank pain and difficulty urinating.  Endocrine: Denies: hot or cold intolerance, sweats, changes in hair or nails, polyuria, polydipsia. Musculoskeletal: Denies myalgias, back pain, joint swelling, arthralgias and gait  problem.  Skin: Denies pallor, rash and wound.  Neurological: Denies dizziness, seizures, syncope, weakness, light-headedness, numbness and headaches.  Hematological: Denies adenopathy. Easy bruising, personal or family bleeding history  Psychiatric/Behavioral: Denies suicidal ideation,  Confusion and agitation    Physical Exam: Vitals:   09/15/20 1434  BP: 130/80  Pulse: 86  Temp: 98.3 F (36.8 C)  TempSrc: Oral  SpO2: 97%  Weight: 134 lb 4.8 oz (60.9 kg)    Body mass index is 21.84 kg/m.   Constitutional: NAD, calm, comfortable Eyes: PERRL, lids and conjunctivae normal ENMT: Mucous membranes are moist.  Respiratory: clear to auscultation bilaterally, no wheezing, no crackles. Normal respiratory effort. No accessory muscle use.  Cardiovascular: Regular rate and rhythm, no murmurs / rubs / gallops. No extremity edema. Neurologic: Grossly intact and nonfocal Psychiatric: Normal judgment and insight. Alert and oriented x 3. Normal mood.    Impression and Plan:  Insomnia, unspecified type  -PHQ-9 score today is 7, mainly because of sleeping disturbances. -Start Zoloft 25 mg at bedtime which may help with both her mood and her sleep. -She will return in 6 weeks for follow-up.  Essential hypertension -Well-controlled.   Patient Instructions  -Nice seeing you today!!  -Start zoloft 25 mg at bedtime.  -Schedule follow up in 3 months.     Chaya Jan, MD [facility]

## 2020-09-15 NOTE — Patient Instructions (Signed)
-  Nice seeing you today!!  -Start zoloft 25 mg at bedtime.  -Schedule follow up in 3 months.

## 2020-09-18 NOTE — Telephone Encounter (Signed)
error 

## 2020-09-20 ENCOUNTER — Encounter: Payer: Medicare PPO | Admitting: Rehabilitative and Restorative Service Providers"

## 2020-09-21 ENCOUNTER — Other Ambulatory Visit: Payer: Self-pay

## 2020-09-21 ENCOUNTER — Encounter: Payer: Self-pay | Admitting: Rehabilitative and Restorative Service Providers"

## 2020-09-21 ENCOUNTER — Ambulatory Visit (INDEPENDENT_AMBULATORY_CARE_PROVIDER_SITE_OTHER): Payer: Medicare PPO | Admitting: Rehabilitative and Restorative Service Providers"

## 2020-09-21 DIAGNOSIS — M25562 Pain in left knee: Secondary | ICD-10-CM

## 2020-09-21 DIAGNOSIS — M6281 Muscle weakness (generalized): Secondary | ICD-10-CM

## 2020-09-21 DIAGNOSIS — M25662 Stiffness of left knee, not elsewhere classified: Secondary | ICD-10-CM

## 2020-09-21 DIAGNOSIS — R600 Localized edema: Secondary | ICD-10-CM

## 2020-09-21 NOTE — Therapy (Signed)
[facility] [address] [place], [place], [address] Phone: [phone]   Fax:  [phone]  Physical Therapy Treatment  Patient Details  Name: Teresa Martinez MRN: [id number] Date of Birth: 07-12-1941 Referring Provider (PT): Ninfa Linden   Encounter Date: 09/21/2020   PT End of Session - 09/21/20 1353    Visit Number 16    Number of Visits 17    Date for PT Re-Evaluation 10/10/20    Authorization Type [employer], reauth sent 9/13, with 6 more visits until 10/8, submitted last request to get dates extended to 11/23 for her last 3 remaining    Authorization - Visit Number 5    Authorization - Number of Visits 6    Progress Note Due on Visit 20    PT Start Time 1342    PT Stop Time 1430    PT Time Calculation (min) 48 min    Activity Tolerance Patient tolerated treatment well    Behavior During Therapy [facility] for tasks assessed/performed           Past Medical History:  Diagnosis Date  . Cervical spine degeneration    C5/C6  . Chronic right shoulder pain   . Hemiparesis affecting dominant side as late effect of cerebrovascular accident ([place]) 12/07/2014  . Hyperlipemia   . Hypertension   . Menopausal syndrome    [name])  . Overactive bladder   . Statin intolerance     Past Surgical History:  Procedure Laterality Date  . ABDOMINAL HYSTERECTOMY     1985  . BAND HEMORRHOIDECTOMY     2010  . CHOLECYSTECTOMY     1950  . PAROTIDECTOMY     30 years ago    There were no vitals filed for this visit.   Subjective Assessment - 09/21/20 1347    Subjective No specific complaints of symptoms new today.    How long can you stand comfortably? less than 10 minutes    How long can you walk comfortably? depends    Currently in Pain? No/denies    Pain Score 0-No pain    Pain Onset 1 to 4 weeks ago                             [facility] Adult PT Treatment/Exercise - 09/21/20 0001      Neuro Re-ed    Neuro Re-ed  Details  SLS c anterior/medial/lateral cone touches x 6 bilateral CGA/min A, lateral stepping 3 cones x 6 bilateral c CGA/Min A      Knee/Hip Exercises: Stretches   Gastroc Stretch 30 seconds;Both;3 reps      Knee/Hip Exercises: Aerobic   Nustep Lvl 10 10 mins      Knee/Hip Exercises: Machines for Strengthening   Cybex Knee Extension 35 lbs 3 x 15 bilateral    Cybex Knee Flexion 35 lbs 3X15 bilat      Knee/Hip Exercises: Standing   Forward Step Up Step Height: 6";10 reps;2 sets;Both   CGA     Vasopneumatic   Number Minutes Vasopneumatic  10 minutes    Vasopnuematic Location  Knee    Vasopneumatic Pressure Medium    Vasopneumatic Temperature  34                       PT Long Term Goals - 09/21/20 1352      PT LONG TERM GOAL #1   Title Pt will be I and compliant  with HEP. (target for all goals 6 weeks 07/25/20)    Baseline met 9/1    Time 6    Period Weeks    Status Achieved      PT LONG TERM GOAL #2   Title She will increase Lt knee extension AROM to 3 deg or less from being straight.    Baseline now 4 deg    Status Achieved      PT LONG TERM GOAL #3   Title Pt will improve Lt hip strength to overall 4+/5 and Lt knee strength to 5/5 to improve function.    Baseline met 9/1    Status Achieved      PT LONG TERM GOAL #4   Title Pt will be able to ambulate community distances at least 1000 ft on even and uneven surfaces and negotiate stairs without complaints.    Baseline 09/21/2020: able to perform, fatigue, soreness noted    Status Partially Met      PT LONG TERM GOAL #5   Title Pt will improve 5TSTS test to 13 seconds or less to show improved balance and endurance    Baseline 8.8 sec on 9/1    Status Achieved                 Plan - 09/21/20 1419    Clinical Impression Statement Fair to poor control on SL dynamic stability today, may continue to benefit from intervention to progress to improve stability in ambulation c and s SPC.    Personal  Factors and Comorbidities Comorbidity 3+    Comorbidities DDD lumbar stenosis, ITB syndrome right side, rt shoulder adhesive capsulitis, CVA rt Hemiparesis, HTN, L3-5 laminectomy 2019,    Examination-Activity Limitations Bend;Carry;Lift;Stand;Stairs;Squat;Locomotion Level    Examination-Participation Restrictions Cleaning;Shop;Laundry;Yard Work    Stability/Clinical Decision Making Stable/Uncomplicated    Rehab Potential Good    PT Frequency 2x / week   1-2   PT Duration 6 weeks   4-6 weeks   PT Treatment/Interventions ADLs/Self Care Home Management;Cryotherapy;Electrical Stimulation;Iontophoresis 73m/ml Dexamethasone;Moist Heat;Ultrasound;Gait training;Stair training;Functional mobility training;Therapeutic activities;Therapeutic exercise;Balance training;Neuromuscular re-education;Manual techniques;Passive range of motion;Dry needling;Joint Manipulations;Vasopneumatic Device;Taping    PT Next Visit Plan Last visit planned for next time.    PT Home Exercise Plan Access Code: [email or web address]    Consulted and Agree with Plan of Care Patient           Patient will benefit from skilled therapeutic intervention in order to improve the following deficits and impairments:  Abnormal gait, Decreased activity tolerance, Decreased balance, Decreased mobility, Decreased endurance, Decreased range of motion, Decreased strength, Increased edema, Difficulty walking, Increased fascial restricitons, Impaired flexibility, Pain  Visit Diagnosis: Acute pain of left knee  Stiffness of left knee, not elsewhere classified  Muscle weakness (generalized)  Localized edema     Problem List Patient Active Problem List   Diagnosis Date Noted  . Preop testing 07/09/2018  . Degenerative lumbar spinal stenosis 05/12/2018  . Statin myopathy 08/29/2016  . Iliotibial band syndrome of right side 04/23/2016  . Acromioclavicular arthrosis 06/30/2015  . Left carpal tunnel syndrome 05/15/2015  . Adhesive capsulitis of  right shoulder 02/06/2015  . Dysarthria due to cerebrovascular accident 01/09/2015  . Hemiparesis affecting dominant side as late effect of cerebrovascular accident ([place] 12/07/2014  . Spastic neurogenic bladder 10/31/2014  . Right rotator cuff tendonitis 10/19/2014  . Stenosis of cervical spine region   . Chronic right shoulder pain   . Cerebral infarction due to thrombosis of left middle cerebral artery (  [place])   . Left pontine stroke ([place])   . TIA (transient ischemic attack) 10/14/2014  . Overactive bladder 10/14/2014  . H/O: CVA (cerebrovascular accident) 10/14/2014  . NECK PAIN, CHRONIC 10/29/2010  . Essential hypertension 10/10/2009  . INSOMNIA 09/15/2009  . Pure hypercholesterolemia 11/28/2008  . MENOPAUSAL SYNDROME 11/28/2008   Scot Jun, PT, DPT, OCS, ATC 09/21/20  2:21 PM    [facility] [address] [place], [place], [address] Phone: [phone]   Fax:  [phone]  Name: Teresa Martinez MRN: [id number] Date of Birth: [birth year]

## 2020-09-22 ENCOUNTER — Encounter: Payer: Medicare PPO | Admitting: Physical Therapy

## 2020-09-27 ENCOUNTER — Other Ambulatory Visit: Payer: Self-pay

## 2020-09-27 ENCOUNTER — Encounter: Payer: Self-pay | Admitting: Rehabilitative and Restorative Service Providers"

## 2020-09-27 ENCOUNTER — Ambulatory Visit (INDEPENDENT_AMBULATORY_CARE_PROVIDER_SITE_OTHER): Payer: Medicare PPO | Admitting: Rehabilitative and Restorative Service Providers"

## 2020-09-27 DIAGNOSIS — M25562 Pain in left knee: Secondary | ICD-10-CM | POA: Diagnosis not present

## 2020-09-27 DIAGNOSIS — R600 Localized edema: Secondary | ICD-10-CM | POA: Diagnosis not present

## 2020-09-27 DIAGNOSIS — M6281 Muscle weakness (generalized): Secondary | ICD-10-CM | POA: Diagnosis not present

## 2020-09-27 DIAGNOSIS — M25662 Stiffness of left knee, not elsewhere classified: Secondary | ICD-10-CM

## 2020-09-27 NOTE — Therapy (Signed)
[facility] Therapy [address] [place], [place], [address] Phone: [phone]   Fax:  [phone]  Physical Therapy Treatment/Discharge  Patient Details  Name: Teresa Martinez MRN: [id number] Date of Birth: [birth year] Referring Provider (PT): Ninfa Linden   Encounter Date: 09/27/2020   PT End of Session - 09/27/20 1606    Visit Number 17    Number of Visits 17    Date for PT Re-Evaluation 10/10/20    Authorization Type [employer], reauth sent 9/13, with 6 more visits until 10/8, submitted last request to get dates extended to 11/23 for her last 3 remaining    Authorization - Visit Number 6    Authorization - Number of Visits 6    Progress Note Due on Visit 20    PT Start Time 1558    PT Stop Time 1625    PT Time Calculation (min) 27 min    Activity Tolerance Patient tolerated treatment well    Behavior During Therapy [facility] for tasks assessed/performed           Past Medical History:  Diagnosis Date  . Cervical spine degeneration    C5/C6  . Chronic right shoulder pain   . Hemiparesis affecting dominant side as late effect of cerebrovascular accident ([place]) 12/07/2014  . Hyperlipemia   . Hypertension   . Menopausal syndrome    Teresa Martinez)  . Overactive bladder   . Statin intolerance     Past Surgical History:  Procedure Laterality Date  . ABDOMINAL HYSTERECTOMY     1985  . BAND HEMORRHOIDECTOMY     2010  . CHOLECYSTECTOMY     1950  . PAROTIDECTOMY     30 years ago    There were no vitals filed for this visit.   Subjective Assessment - 09/27/20 1601    Subjective Pt. stated she is still ready for plan for HEP transition.    How long can you stand comfortably? less than 10 minutes    How long can you walk comfortably? depends    Currently in Pain? No/denies    Pain Orientation Left    Pain Onset 1 to 4 weeks ago              [facility] PT Assessment - 09/27/20 0001      Assessment   Medical Diagnosis s/p left knee  arthroscopy    Referring Provider (PT) Ninfa Linden    Onset Date/Surgical Date 05/25/20                         [facility] Adult PT Treatment/Exercise - 09/27/20 0001      Knee/Hip Exercises: Stretches   Gastroc Stretch 30 seconds;Both;3 reps      Knee/Hip Exercises: Aerobic   Nustep Lvl 10 10 mins      Knee/Hip Exercises: Machines for Strengthening   Cybex Knee Extension 35 lbs 3 x 15 bilateral    Cybex Knee Flexion 35 lbs 3X15 bilat    Cybex Leg Press 105 lbs 3 x 15 DL, Sl 75 lbs 3 x 10 each LE                       PT Long Term Goals - 09/27/20 1604      PT LONG TERM GOAL #1   Title Pt will be I and compliant with HEP. (target for all goals 6 weeks 07/25/20)    Baseline met 9/1    Time 6  Period Weeks    Status Achieved      PT LONG TERM GOAL #2   Title She will increase Lt knee extension AROM to 3 deg or less from being straight.    Baseline now 4 deg    Status Achieved      PT LONG TERM GOAL #3   Title Pt will improve Lt hip strength to overall 4+/5 and Lt knee strength to 5/5 to improve function.    Baseline met 9/1    Status Achieved      PT LONG TERM GOAL #4   Title Pt will be able to ambulate community distances at least 1000 ft on even and uneven surfaces and negotiate stairs without complaints.    Baseline 09/21/2020: able to perform, fatigue, soreness noted    Status Achieved      PT LONG TERM GOAL #5   Title Pt will improve 5TSTS test to 13 seconds or less to show improved balance and endurance    Baseline 8.8 sec on 9/1    Status Achieved                 Plan - 09/27/20 1604    Clinical Impression Statement Pt. appropriate for HEP transitions c good knowledge of plan and demonstration, reaching established goals.    Personal Factors and Comorbidities Comorbidity 3+    Comorbidities DDD lumbar stenosis, ITB syndrome right side, rt shoulder adhesive capsulitis, CVA rt Hemiparesis, HTN, L3-5 laminectomy 2019,     Examination-Activity Limitations Bend;Carry;Lift;Stand;Stairs;Squat;Locomotion Level    Examination-Participation Restrictions Cleaning;Shop;Laundry;Yard Work    Stability/Clinical Decision Making Stable/Uncomplicated    Rehab Potential Good    PT Frequency 2x / week   1-2   PT Duration 6 weeks   4-6 weeks   PT Treatment/Interventions ADLs/Self Care Home Management;Cryotherapy;Electrical Stimulation;Iontophoresis 52m/ml Dexamethasone;Moist Heat;Ultrasound;Gait training;Stair training;Functional mobility training;Therapeutic activities;Therapeutic exercise;Balance training;Neuromuscular re-education;Manual techniques;Passive range of motion;Dry needling;Joint Manipulations;Vasopneumatic Device;Taping    PT Next Visit Plan D/C to HEP    PT Home Exercise Plan Access Code: [email or web address]    Consulted and Agree with Plan of Care Patient           Patient will benefit from skilled therapeutic intervention in order to improve the following deficits and impairments:  Abnormal gait, Decreased activity tolerance, Decreased balance, Decreased mobility, Decreased endurance, Decreased range of motion, Decreased strength, Increased edema, Difficulty walking, Increased fascial restricitons, Impaired flexibility, Pain  Visit Diagnosis: Acute pain of left knee  Stiffness of left knee, not elsewhere classified  Muscle weakness (generalized)  Localized edema     Problem List Patient Active Problem List   Diagnosis Date Noted  . Preop testing 07/09/2018  . Degenerative lumbar spinal stenosis 05/12/2018  . Statin myopathy 08/29/2016  . Iliotibial band syndrome of right side 04/23/2016  . Acromioclavicular arthrosis 06/30/2015  . Left carpal tunnel syndrome 05/15/2015  . Adhesive capsulitis of right shoulder 02/06/2015  . Dysarthria due to cerebrovascular accident 01/09/2015  . Hemiparesis affecting dominant side as late effect of cerebrovascular accident ([place] 12/07/2014  . Spastic neurogenic bladder  10/31/2014  . Right rotator cuff tendonitis 10/19/2014  . Stenosis of cervical spine region   . Chronic right shoulder pain   . Cerebral infarction due to thrombosis of left middle cerebral artery ([place]   . Left pontine stroke ([place]   . TIA (transient ischemic attack) 10/14/2014  . Overactive bladder 10/14/2014  . H/O: CVA (cerebrovascular accident) 10/14/2014  . NECK PAIN, CHRONIC 10/29/2010  . Essential hypertension  10/10/2009  . INSOMNIA 09/15/2009  . Pure hypercholesterolemia 11/28/2008  . MENOPAUSAL SYNDROME 11/28/2008    PHYSICAL THERAPY DISCHARGE SUMMARY  Visits from Start of Care: 17  Current functional level related to goals / functional outcomes: See note   Remaining deficits: See note   Education / Equipment: HEP Plan: Patient agrees to discharge.  Patient goals were met. Patient is being discharged due to meeting the stated rehab goals.  ?????    Scot Jun, PT, DPT, OCS, ATC 09/27/20  4:16 PM    [facility] [address] [place], [place], [address] Phone: [phone]   Fax:  [phone]  Name: LUCILLE WITTS MRN: [id number] Date of Birth: 10/06/1941

## 2020-10-06 ENCOUNTER — Encounter: Payer: Medicare PPO | Admitting: Rehabilitative and Restorative Service Providers"

## 2020-10-24 ENCOUNTER — Other Ambulatory Visit: Payer: Self-pay | Admitting: Internal Medicine

## 2020-10-24 DIAGNOSIS — I10 Essential (primary) hypertension: Secondary | ICD-10-CM

## 2020-10-24 DIAGNOSIS — E7800 Pure hypercholesterolemia, unspecified: Secondary | ICD-10-CM

## 2020-11-13 ENCOUNTER — Ambulatory Visit (INDEPENDENT_AMBULATORY_CARE_PROVIDER_SITE_OTHER): Payer: Medicare PPO | Admitting: Orthopaedic Surgery

## 2020-11-13 ENCOUNTER — Encounter: Payer: Self-pay | Admitting: Orthopaedic Surgery

## 2020-11-13 ENCOUNTER — Other Ambulatory Visit: Payer: Self-pay

## 2020-11-13 DIAGNOSIS — S838X2S Sprain of other specified parts of left knee, sequela: Secondary | ICD-10-CM

## 2020-11-13 DIAGNOSIS — Z9889 Other specified postprocedural states: Secondary | ICD-10-CM | POA: Diagnosis not present

## 2020-11-13 DIAGNOSIS — M1712 Unilateral primary osteoarthritis, left knee: Secondary | ICD-10-CM | POA: Diagnosis not present

## 2020-11-13 MED ORDER — METHYLPREDNISOLONE ACETATE 40 MG/ML IJ SUSP
40.0000 mg | INTRAMUSCULAR | Status: AC | PRN
  Administered 2020-11-13: 40 mg via INTRA_ARTICULAR

## 2020-11-13 MED ORDER — LIDOCAINE HCL 1 % IJ SOLN
3.0000 mL | INTRAMUSCULAR | Status: AC | PRN
  Administered 2020-11-13: 3 mL

## 2020-11-13 NOTE — Progress Notes (Signed)
Office Visit Note   Patient: Teresa Martinez           Date of Birth: [birth year]           MRN: [id number] Visit Date: 11/13/2020              Requested by: Philip Aspen, Limmie Patricia, MD [address] [place],  [place] [address] PCP: Philip Aspen, Limmie Patricia, MD   Assessment & Plan: Visit Diagnoses:  1. Status post arthroscopy of left knee   2. Unilateral primary osteoarthritis, left knee   3. Acute medial meniscal injury of knee, left, sequela     Plan: After aspirating 40 cc of fluid consistent with osteoarthritis, I did place a steroid injection in her left knee.  She is a perfect candidate for hyaluronic acid we will order this for her in the next.  All questions and concerns were answered and addressed.  Follow-Up Instructions: Return in about 4 weeks (around 12/11/2020).   Orders:  No orders of the defined types were placed in this encounter.  No orders of the defined types were placed in this encounter.     Procedures: Large Joint Inj: L knee on 11/13/2020 3:46 PM Indications: diagnostic evaluation and pain Details: 22 G 1.5 in needle, superolateral approach  Arthrogram: No  Medications: 3 mL lidocaine 1 %; 40 mg methylPREDNISolone acetate 40 MG/ML Outcome: tolerated well, no immediate complications Procedure, treatment alternatives, risks and benefits explained, specific risks discussed. Consent was given by the patient. Immediately prior to procedure a time out was called to verify the correct patient, procedure, equipment, support staff and site/side marked as required. Patient was prepped and draped in the usual sterile fashion.       Clinical Data: No additional findings.   Subjective: Chief Complaint  Patient presents with  . Left Knee - Pain, Follow-up  The patient is a very pleasant 79 year old female who we performed arthroscopic surgery on her left knee earlier this year.  She comes in today with a recurrent left knee effusion of  left knee pain.  At the past we recommended eventually placing hyaluronic acid in that knee.  She has had no other acute change in her medical status.  She is a thin individual and she does ambulate a cane.  She reports a lot of posterior and posterior medial knee pain.  She does report an effusion.  At this point she is instructed in a steroid injection today and hopefully eventually hyaluronic acid for her left knee.  She has had no other acute change in her medical status.  HPI  Review of Systems She currently denies any headache, chest pain, shortness of breath, fever, chills, nausea, vomiting  Objective: Vital Signs: There were no vitals taken for this visit.  Physical Exam She is alert and oriented x3 and in no acute distress Ortho Exam Examination of her left knee does show a mild effusion.  I was able to aspirate 40 cc of fluid from the knee.  There is no redness and the knee moves well with some global tenderness. Specialty Comments:  No specialty comments available.  Imaging: No results found.   PMFS History: Patient Active Problem List   Diagnosis Date Noted  . Preop testing 07/09/2018  . Degenerative lumbar spinal stenosis 05/12/2018  . Statin myopathy 08/29/2016  . Iliotibial band syndrome of right side 04/23/2016  . Acromioclavicular arthrosis 06/30/2015  . Left carpal tunnel syndrome 05/15/2015  . Adhesive capsulitis of right shoulder  02/06/2015  . Dysarthria due to cerebrovascular accident 01/09/2015  . Hemiparesis affecting dominant side as late effect of cerebrovascular accident ([facility]) 12/07/2014  . Spastic neurogenic bladder 10/31/2014  . Right rotator cuff tendonitis 10/19/2014  . Stenosis of cervical spine region   . Chronic right shoulder pain   . Cerebral infarction due to thrombosis of left middle cerebral artery ([facility])   . Left pontine stroke ([facility])   . TIA (transient ischemic attack) 10/14/2014  . Overactive bladder 10/14/2014  . H/O: CVA  (cerebrovascular accident) 10/14/2014  . NECK PAIN, CHRONIC 10/29/2010  . Essential hypertension 10/10/2009  . INSOMNIA 09/15/2009  . Pure hypercholesterolemia 11/28/2008  . MENOPAUSAL SYNDROME 11/28/2008   Past Medical History:  Diagnosis Date  . Cervical spine degeneration    C5/C6  . Chronic right shoulder pain   . Hemiparesis affecting dominant side as late effect of cerebrovascular accident ([facility]) 12/07/2014  . Hyperlipemia   . Hypertension   . Menopausal syndrome    [name])  . Overactive bladder   . Statin intolerance     Family History  Problem Relation Age of Onset  . Heart failure Father   . Diabetes Mother   . Hypertension Mother   . Stroke Mother   . Cirrhosis Sister   . Diabetes Sister   . Hyperlipidemia Brother   . Hypertension Brother   . Stroke Brother   . Diabetes Brother     Past Surgical History:  Procedure Laterality Date  . ABDOMINAL HYSTERECTOMY     1985  . BAND HEMORRHOIDECTOMY     2010  . CHOLECYSTECTOMY     1950  . PAROTIDECTOMY     30 years ago   Social History   Occupational History  . Occupation: retired  Tobacco Use  . Smoking status: Never Smoker  . Smokeless tobacco: Never Used  Substance and Sexual Activity  . Alcohol use: No  . Drug use: No  . Sexual activity: Not on file

## 2020-11-20 ENCOUNTER — Telehealth: Payer: Self-pay

## 2020-11-20 NOTE — Telephone Encounter (Signed)
Submitted for VOB for Monovisc-Left knee  Last injection 06/29/20  Appt after 12/30/20

## 2020-11-22 ENCOUNTER — Telehealth: Payer: Self-pay

## 2020-11-22 NOTE — Telephone Encounter (Signed)
Tried calling pt to inform but no answer and just kept ringing. Will try again later

## 2020-11-22 NOTE — Telephone Encounter (Signed)
Approved for Monovisc-Left knee Dr. Kathrin Greathouse and Bill $40 copay No OOP Prior auth with [employer] Auth # [id number] Dates: 11/22/20-05/22/21  Needs appt after 12/30/20

## 2020-11-23 NOTE — Telephone Encounter (Signed)
Called and r/s pt

## 2020-11-24 ENCOUNTER — Other Ambulatory Visit: Payer: Medicare PPO

## 2020-11-24 DIAGNOSIS — Z20822 Contact with and (suspected) exposure to covid-19: Secondary | ICD-10-CM

## 2020-11-28 LAB — NOVEL CORONAVIRUS, NAA: SARS-CoV-2, NAA: NOT DETECTED

## 2020-12-07 ENCOUNTER — Ambulatory Visit: Payer: Medicare PPO | Admitting: Orthopaedic Surgery

## 2020-12-08 DIAGNOSIS — Z01419 Encounter for gynecological examination (general) (routine) without abnormal findings: Secondary | ICD-10-CM | POA: Diagnosis not present

## 2020-12-08 DIAGNOSIS — Z6822 Body mass index (BMI) 22.0-22.9, adult: Secondary | ICD-10-CM | POA: Diagnosis not present

## 2020-12-08 DIAGNOSIS — Z124 Encounter for screening for malignant neoplasm of cervix: Secondary | ICD-10-CM | POA: Diagnosis not present

## 2020-12-08 DIAGNOSIS — Z1231 Encounter for screening mammogram for malignant neoplasm of breast: Secondary | ICD-10-CM | POA: Diagnosis not present

## 2020-12-08 DIAGNOSIS — Z01411 Encounter for gynecological examination (general) (routine) with abnormal findings: Secondary | ICD-10-CM | POA: Diagnosis not present

## 2020-12-08 DIAGNOSIS — N952 Postmenopausal atrophic vaginitis: Secondary | ICD-10-CM | POA: Diagnosis not present

## 2020-12-11 ENCOUNTER — Ambulatory Visit: Payer: Medicare PPO | Admitting: Orthopaedic Surgery

## 2020-12-14 ENCOUNTER — Other Ambulatory Visit: Payer: Self-pay

## 2020-12-15 ENCOUNTER — Ambulatory Visit: Payer: Medicare PPO | Admitting: Internal Medicine

## 2020-12-15 ENCOUNTER — Encounter: Payer: Self-pay | Admitting: Internal Medicine

## 2020-12-15 VITALS — BP 150/90 | HR 81 | Temp 97.9°F | Wt 137.2 lb

## 2020-12-15 DIAGNOSIS — I10 Essential (primary) hypertension: Secondary | ICD-10-CM

## 2020-12-15 NOTE — Progress Notes (Signed)
Established Patient Office Visit     This visit occurred during the SARS-CoV-2 public health emergency.  Safety protocols were in place, including screening questions prior to the visit, additional usage of staff PPE, and extensive cleaning of exam room while observing appropriate contact time as indicated for disinfecting solutions.    CC/Reason for Visit: Blood pressure follow-up  HPI: Teresa Martinez is a 80 y.o. female who is coming in today for the above mentioned reasons.  She has a history of benign essential hypertension and stopped taking lisinopril last year after repeated hypotension.  Last Friday she went to see her GYN and was noted to have elevated blood pressures.  She decided to resume lisinopril.  It has now been a week.  She has no complaints.   Past Medical/Surgical History: Past Medical History:  Diagnosis Date  . Cervical spine degeneration    C5/C6  . Chronic right shoulder pain   . Hemiparesis affecting dominant side as late effect of cerebrovascular accident (HCC) 12/07/2014  . Hyperlipemia   . Hypertension   . Menopausal syndrome    [name] [place])  . Overactive bladder   . Statin intolerance     Past Surgical History:  Procedure Laterality Date  . ABDOMINAL HYSTERECTOMY     1985  . BAND HEMORRHOIDECTOMY     2010  . CHOLECYSTECTOMY     1950  . PAROTIDECTOMY     30 years ago    Social History:  reports that she has never smoked. She has never used smokeless tobacco. She reports that she does not drink alcohol and does not use drugs.  Allergies: Allergies  Allergen Reactions  . Statins Other (See Comments)    Muscle pain and unable to walk Muscle pain and unable to walk  . Latex Other (See Comments)    Burns her skin    Family History:  Family History  Problem Relation Age of Onset  . Heart failure Father   . Diabetes Mother   . Hypertension Mother   . Stroke Mother   . Cirrhosis Sister   . Diabetes Sister   .  Hyperlipidemia Brother   . Hypertension Brother   . Stroke Brother   . Diabetes Brother      Current Outpatient Medications:  .  aspirin EC 325 MG EC tablet, Take 1 tablet (325 mg total) by mouth daily., Disp: 100 tablet, Rfl: 1 .  conjugated estrogens (PREMARIN) vaginal cream, Place 0.5 g vaginally., Disp: , Rfl:  .  diazepam (VALIUM) 5 MG tablet, TAKE ONE TAB ONE HOUR PRIOR TO MRI REPEAT AS NEEDED #2 . ZERO REFILLS, Disp: 2 tablet, Rfl: 0 .  ezetimibe (ZETIA) 10 MG tablet, TAKE 1 TABLET(10 MG) BY MOUTH DAILY, Disp: 90 tablet, Rfl: 1 .  fesoterodine (TOVIAZ) 4 MG TB24 tablet, TAKE 1 TABLET(4 MG) BY MOUTH DAILY, Disp: 90 tablet, Rfl: 1 .  HYDROcodone-acetaminophen (NORCO/VICODIN) 5-325 MG tablet, Take 1-2 tablets by mouth every 6 (six) hours as needed for moderate pain., Disp: 30 tablet, Rfl: 0 .  lisinopril (ZESTRIL) 10 MG tablet, TAKE 1 TABLET(10 MG) BY MOUTH DAILY, Disp: 90 tablet, Rfl: 1 .  sertraline (ZOLOFT) 25 MG tablet, Take 1 tablet (25 mg total) by mouth daily., Disp: 90 tablet, Rfl: 1  Review of Systems:  Constitutional: Denies fever, chills, diaphoresis, appetite change and fatigue.  HEENT: Denies photophobia, eye pain, redness, hearing loss, ear pain, congestion, sore throat, rhinorrhea, sneezing, mouth sores, trouble swallowing, neck pain, neck  stiffness and tinnitus.   Respiratory: Denies SOB, DOE, cough, chest tightness,  and wheezing.   Cardiovascular: Denies chest pain, palpitations and leg swelling.  Gastrointestinal: Denies nausea, vomiting, abdominal pain, diarrhea, constipation, blood in stool and abdominal distention.  Genitourinary: Denies dysuria, urgency, frequency, hematuria, flank pain and difficulty urinating.  Endocrine: Denies: hot or cold intolerance, sweats, changes in hair or nails, polyuria, polydipsia. Musculoskeletal: Denies myalgias, back pain, joint swelling, arthralgias and gait problem.  Skin: Denies pallor, rash and wound.  Neurological: Denies  dizziness, seizures, syncope, weakness, light-headedness, numbness and headaches.  Hematological: Denies adenopathy. Easy bruising, personal or family bleeding history  Psychiatric/Behavioral: Denies suicidal ideation, mood changes, confusion, nervousness, sleep disturbance and agitation    Physical Exam: Vitals:   12/15/20 1516  BP: (!) 150/90  Pulse: 81  Temp: 97.9 F (36.6 C)  TempSrc: Oral  SpO2: 98%  Weight: 137 lb 3.2 oz (62.2 kg)    Body mass index is 22.31 kg/m.   Constitutional: NAD, calm, comfortable Eyes: PERRL, lids and conjunctivae normal ENMT: Mucous membranes are moist.  Neurologic: Grossly intact and nonfocal. Psychiatric: Normal judgment and insight. Alert and oriented x 3. Normal mood.    Impression and Plan:  Essential hypertension -Blood pressure remains elevated, however it is too soon to determine whether she needs a dose increase of her lisinopril. -She will schedule appointment in 6 weeks.     Chaya Jan, MD [facility] at [facility]

## 2021-01-01 ENCOUNTER — Ambulatory Visit: Payer: Medicare PPO | Admitting: Orthopaedic Surgery

## 2021-01-02 ENCOUNTER — Encounter: Payer: Self-pay | Admitting: Orthopaedic Surgery

## 2021-01-02 ENCOUNTER — Ambulatory Visit: Payer: Medicare PPO | Admitting: Orthopaedic Surgery

## 2021-01-02 DIAGNOSIS — M1712 Unilateral primary osteoarthritis, left knee: Secondary | ICD-10-CM

## 2021-01-02 MED ORDER — HYALURONAN 88 MG/4ML IX SOSY
88.0000 mg | PREFILLED_SYRINGE | INTRA_ARTICULAR | Status: AC | PRN
  Administered 2021-01-02: 88 mg via INTRA_ARTICULAR

## 2021-01-02 NOTE — Progress Notes (Signed)
   Procedure Note  Patient: Teresa Martinez             Date of Birth: Mar 21, 1941           MRN: [id number]             Visit Date: 01/02/2021  Procedures: Visit Diagnoses:  1. Unilateral primary osteoarthritis, left knee     Large Joint Inj: L knee on 01/02/2021 10:34 AM Indications: diagnostic evaluation and pain Details: 22 G 1.5 in needle, superolateral approach  Arthrogram: No  Medications: 88 mg Hyaluronan 88 MG/4ML Outcome: tolerated well, no immediate complications Procedure, treatment alternatives, risks and benefits explained, specific risks discussed. Consent was given by the patient. Immediately prior to procedure a time out was called to verify the correct patient, procedure, equipment, support staff and site/side marked as required. Patient was prepped and draped in the usual sterile fashion.    The patient comes in today for scheduled hyaluronic acid injection in her left knee to treat the pain from osteoarthritis.  She does have a moderate joint effusion and I did aspirate 30 cc of yellow fluid from the knee consistent with osteoarthritis.  She understands fully why we are placing Monovisc in her knee today.  This is to treat the pain from osteoarthritis.  She did tolerate the Monovisc injection well.  All questions and concerns were answered and addressed.  Follow-up can be as needed.

## 2021-01-26 ENCOUNTER — Other Ambulatory Visit: Payer: Self-pay

## 2021-01-26 ENCOUNTER — Other Ambulatory Visit: Payer: Self-pay | Admitting: Internal Medicine

## 2021-01-26 ENCOUNTER — Ambulatory Visit (INDEPENDENT_AMBULATORY_CARE_PROVIDER_SITE_OTHER): Payer: Medicare PPO | Admitting: Internal Medicine

## 2021-01-26 ENCOUNTER — Telehealth: Payer: Self-pay

## 2021-01-26 VITALS — BP 134/80 | HR 90 | Temp 98.0°F | Resp 18 | Wt 134.6 lb

## 2021-01-26 DIAGNOSIS — I10 Essential (primary) hypertension: Secondary | ICD-10-CM

## 2021-01-26 DIAGNOSIS — E7800 Pure hypercholesterolemia, unspecified: Secondary | ICD-10-CM

## 2021-01-26 LAB — LIPID PANEL
Cholesterol: 244 mg/dL — ABNORMAL HIGH (ref 0–200)
HDL: 54.9 mg/dL (ref >39.00)
LDL Cholesterol: 172 mg/dL — ABNORMAL HIGH (ref 0–99)
NonHDL: 189.13
Total CHOL/HDL Ratio: 4
Triglycerides: 86 mg/dL (ref 0.0–149.0)
VLDL: 17.2 mg/dL (ref 0.0–40.0)

## 2021-01-26 NOTE — Addendum Note (Signed)
Addended by: Evert Kohl D on: 01/26/2021 10:31 AM   Modules accepted: Orders

## 2021-01-26 NOTE — Progress Notes (Signed)
Established Patient Office Visit     This visit occurred during the SARS-CoV-2 public health emergency.  Safety protocols were in place, including screening questions prior to the visit, additional usage of staff PPE, and extensive cleaning of exam room while observing appropriate contact time as indicated for disinfecting solutions.    CC/Reason for Visit: Blood pressure follow-up  HPI: Teresa Martinez is a 80 y.o. female who is coming in today for the above mentioned reasons. Past Medical History is significant for: Follow-up hypertension and hyperlipidemia.  She was recently restarted on lisinopril 10 mg daily due to persistently elevated blood pressures.  She has tolerated medication well and has no complaints.  She is requesting her lipids to be drawn today.   Past Medical/Surgical History: Past Medical History:  Diagnosis Date  . Cervical spine degeneration    C5/C6  . Chronic right shoulder pain   . Hemiparesis affecting dominant side as late effect of cerebrovascular accident (HCC) 12/07/2014  . Hyperlipemia   . Hypertension   . Menopausal syndrome    [name] [place])  . Overactive bladder   . Statin intolerance     Past Surgical History:  Procedure Laterality Date  . ABDOMINAL HYSTERECTOMY     1985  . BAND HEMORRHOIDECTOMY     2010  . CHOLECYSTECTOMY     1950  . PAROTIDECTOMY     30 years ago    Social History:  reports that she has never smoked. She has never used smokeless tobacco. She reports that she does not drink alcohol and does not use drugs.  Allergies: Allergies  Allergen Reactions  . Statins Other (See Comments)    Muscle pain and unable to walk Muscle pain and unable to walk  . Latex Other (See Comments)    Burns her skin    Family History:  Family History  Problem Relation Age of Onset  . Heart failure Father   . Diabetes Mother   . Hypertension Mother   . Stroke Mother   . Cirrhosis Sister   . Diabetes Sister   .  Hyperlipidemia Brother   . Hypertension Brother   . Stroke Brother   . Diabetes Brother      Current Outpatient Medications:  .  aspirin EC 325 MG EC tablet, Take 1 tablet (325 mg total) by mouth daily., Disp: 100 tablet, Rfl: 1 .  conjugated estrogens (PREMARIN) vaginal cream, Place 0.5 g vaginally., Disp: , Rfl:  .  diazepam (VALIUM) 5 MG tablet, TAKE ONE TAB ONE HOUR PRIOR TO MRI REPEAT AS NEEDED #2 . ZERO REFILLS, Disp: 2 tablet, Rfl: 0 .  ezetimibe (ZETIA) 10 MG tablet, TAKE 1 TABLET(10 MG) BY MOUTH DAILY, Disp: 90 tablet, Rfl: 1 .  fesoterodine (TOVIAZ) 4 MG TB24 tablet, TAKE 1 TABLET(4 MG) BY MOUTH DAILY, Disp: 90 tablet, Rfl: 1 .  HYDROcodone-acetaminophen (NORCO/VICODIN) 5-325 MG tablet, Take 1-2 tablets by mouth every 6 (six) hours as needed for moderate pain., Disp: 30 tablet, Rfl: 0 .  lisinopril (ZESTRIL) 10 MG tablet, TAKE 1 TABLET(10 MG) BY MOUTH DAILY, Disp: 90 tablet, Rfl: 1 .  sertraline (ZOLOFT) 25 MG tablet, Take 1 tablet (25 mg total) by mouth daily., Disp: 90 tablet, Rfl: 1  Review of Systems:  Constitutional: Denies fever, chills, diaphoresis, appetite change and fatigue.  HEENT: Denies photophobia, eye pain, redness, hearing loss, ear pain, congestion, sore throat, rhinorrhea, sneezing, mouth sores, trouble swallowing, neck pain, neck stiffness and tinnitus.   Respiratory: Denies  SOB, DOE, cough, chest tightness,  and wheezing.   Cardiovascular: Denies chest pain, palpitations and leg swelling.  Gastrointestinal: Denies nausea, vomiting, abdominal pain, diarrhea, constipation, blood in stool and abdominal distention.  Genitourinary: Denies dysuria, urgency, frequency, hematuria, flank pain and difficulty urinating.  Endocrine: Denies: hot or cold intolerance, sweats, changes in hair or nails, polyuria, polydipsia. Musculoskeletal: Denies myalgias, back pain, joint swelling, arthralgias and gait problem.  Skin: Denies pallor, rash and wound.  Neurological: Denies  dizziness, seizures, syncope, weakness, light-headedness, numbness and headaches.  Hematological: Denies adenopathy. Easy bruising, personal or family bleeding history  Psychiatric/Behavioral: Denies suicidal ideation, mood changes, confusion, nervousness, sleep disturbance and agitation    Physical Exam: Vitals:   01/26/21 1000 01/26/21 1003  BP: (!) 160/88 134/80  Pulse: 90   Resp: 18   Temp: 98 F (36.7 C)   TempSrc: Oral   SpO2: 98%   Weight: 134 lb 9.6 oz (61.1 kg)     Body mass index is 21.89 kg/m.   Constitutional: NAD, calm, comfortable Eyes: PERRL, lids and conjunctivae normal ENMT: Mucous membranes are moist.  Heart: Regular rate and rhythm, no murmurs, rubs or gallops. Lungs: Clear to auscultation bilaterally Neurologic: Grossly intact and nonfocal Psychiatric: Normal judgment and insight. Alert and oriented x 3. Normal mood.    Impression and Plan:  Essential hypertension -Initially an elevated blood pressure was noted in the office but subsequently rechecked to be normal.  She brings in her home measurements as well with systolics mainly in the 120s to low 130s and diastolics in the 70s to low 80s. -Continue to monitor for now.  Pure hypercholesterolemia  - Plan: Lipid panel    Patient Instructions  -Nice seeing you today!!  -Lab work today; will notify you once results are available.  -See you back in 6 months for your physical.     Chaya Jan, MD [facility] at [facility]

## 2021-01-26 NOTE — Telephone Encounter (Signed)
When discharging pt today she mentioned that you were going to accept her daughter as a patient, but she forgot to give you her name.   She says her name is Teresa Martinez.

## 2021-01-26 NOTE — Patient Instructions (Signed)
-  Nice seeing you today!!  -Lab work today; will notify you once results are available.  -See you back in 6 months for your physical. 

## 2021-03-15 ENCOUNTER — Ambulatory Visit: Payer: Medicare PPO | Admitting: Orthopaedic Surgery

## 2021-03-15 ENCOUNTER — Ambulatory Visit (INDEPENDENT_AMBULATORY_CARE_PROVIDER_SITE_OTHER): Payer: Medicare PPO

## 2021-03-15 ENCOUNTER — Encounter: Payer: Self-pay | Admitting: Orthopaedic Surgery

## 2021-03-15 DIAGNOSIS — M79604 Pain in right leg: Secondary | ICD-10-CM

## 2021-03-15 DIAGNOSIS — M25551 Pain in right hip: Secondary | ICD-10-CM

## 2021-03-15 DIAGNOSIS — G8929 Other chronic pain: Secondary | ICD-10-CM

## 2021-03-15 DIAGNOSIS — M25562 Pain in left knee: Secondary | ICD-10-CM

## 2021-03-15 MED ORDER — METHYLPREDNISOLONE ACETATE 40 MG/ML IJ SUSP
40.0000 mg | INTRAMUSCULAR | Status: AC | PRN
  Administered 2021-03-15: 40 mg via INTRA_ARTICULAR

## 2021-03-15 MED ORDER — LIDOCAINE HCL 1 % IJ SOLN
3.0000 mL | INTRAMUSCULAR | Status: AC | PRN
  Administered 2021-03-15: 3 mL

## 2021-03-15 NOTE — Progress Notes (Signed)
Office Visit Note   Patient: Teresa Martinez           Date of Birth: Apr 10, 1941           MRN: [id number] Visit Date: 03/15/2021              Requested by: Philip Aspen, Limmie Patricia, MD [address] [place],  [place] [address] PCP: Philip Aspen, Limmie Patricia, MD   Assessment & Plan: Visit Diagnoses:  1. Pain in right hip   2. Pain in right leg     Plan: I was able to aspirate 40 cc of clear fluid from the left knee and placed a steroid in the left knee.  She would like to try outpatient physical therapy to see if that will help with her back as well as her right hip trochanteric area and bilateral knee strengthening.  She knows that we cannot place hyaluronic acid in her knee again until around August or September.  Also talked about the possibility of a left knee replacement but she is still deferring this.  She would like to at least see how hyaluronic acid is for her 1 more time.  Will be set up outpatient physical therapy for her and I will see her back in 3 months.  All questions and concerns were answered and addressed.  Follow-Up Instructions: Return in about 3 months (around 06/14/2021).   Orders:  Orders Placed This Encounter  Procedures  . Large Joint Inj  . XR HIP UNILAT W OR W/O PELVIS 1V RIGHT  . XR Lumbar Spine 2-3 Views   No orders of the defined types were placed in this encounter.     Procedures: Large Joint Inj: L knee on 03/15/2021 2:25 PM Indications: diagnostic evaluation and pain Details: 22 G 1.5 in needle, superolateral approach  Arthrogram: No  Medications: 3 mL lidocaine 1 %; 40 mg methylPREDNISolone acetate 40 MG/ML Outcome: tolerated well, no immediate complications Procedure, treatment alternatives, risks and benefits explained, specific risks discussed. Consent was given by the patient. Immediately prior to procedure a time out was called to verify the correct patient, procedure, equipment, support staff and site/side marked as  required. Patient was prepped and draped in the usual sterile fashion.       Clinical Data: No additional findings.   Subjective: Chief Complaint  Patient presents with  . Right Hip - Follow-up  The patient is well-known to [place].  We have been following her for her left knee with known osteoarthritis of the left knee.  We have even performed an arthroscopic intervention for that left knee.  More recently 2 months ago we did place hyaluronic acid in the knee.  She reports left knee pain and swelling.  She is an active 80 year old female.  She is lean.  She reports low back pain and a previous history of low back surgery as well as right hip pain that she wants to the lateral aspect of the right hip as a source of her pain.  She would like to have her left knee aspirated today and a steroid injection.  HPI  Review of Systems She currently denies any headache, chest pain, shortness of breath, fever, chills, nausea, vomiting  Objective: Vital Signs: There were no vitals taken for this visit.  Physical Exam She is alert and orient x3 and in no acute distress Ortho Exam examination of her left knee does show a moderate effusion.  She has a significant mount of bony tenderness in  general without knee with varus malalignment.  Her right hip moves fluidly and fully with no pain in the groin at all.  There is some pain to palpation of the trochanteric area but also some sciatic based pain. Specialty Comments:  No specialty comments available.  Imaging: XR HIP UNILAT W OR W/O PELVIS 1V RIGHT  Result Date: 03/15/2021 An AP pelvis and lateral right hip shows some mild arthritic changes with slight superior lateral joint space narrowing and a small para-articular osteophyte.  XR Lumbar Spine 2-3 Views  Result Date: 03/15/2021 2 views lumbar spine show grade 1 spondylolisthesis at L4 on L5.    PMFS History: Patient Active Problem List   Diagnosis Date Noted  . Preop testing 07/09/2018  .  Degenerative lumbar spinal stenosis 05/12/2018  . Statin myopathy 08/29/2016  . Iliotibial band syndrome of right side 04/23/2016  . Acromioclavicular arthrosis 06/30/2015  . Left carpal tunnel syndrome 05/15/2015  . Adhesive capsulitis of right shoulder 02/06/2015  . Dysarthria due to cerebrovascular accident 01/09/2015  . Hemiparesis affecting dominant side as late effect of cerebrovascular accident (HCC) 12/07/2014  . Spastic neurogenic bladder 10/31/2014  . Right rotator cuff tendonitis 10/19/2014  . Stenosis of cervical spine region   . Chronic right shoulder pain   . Cerebral infarction due to thrombosis of left middle cerebral artery (HCC)   . Left pontine stroke (HCC)   . TIA (transient ischemic attack) 10/14/2014  . Overactive bladder 10/14/2014  . H/O: CVA (cerebrovascular accident) 10/14/2014  . NECK PAIN, CHRONIC 10/29/2010  . Essential hypertension 10/10/2009  . INSOMNIA 09/15/2009  . Pure hypercholesterolemia 11/28/2008  . MENOPAUSAL SYNDROME 11/28/2008   Past Medical History:  Diagnosis Date  . Cervical spine degeneration    C5/C6  . Chronic right shoulder pain   . Hemiparesis affecting dominant side as late effect of cerebrovascular accident (HCC) 12/07/2014  . Hyperlipemia   . Hypertension   . Menopausal syndrome    [name] [place])  . Overactive bladder   . Statin intolerance     Family History  Problem Relation Age of Onset  . Heart failure Father   . Diabetes Mother   . Hypertension Mother   . Stroke Mother   . Cirrhosis Sister   . Diabetes Sister   . Hyperlipidemia Brother   . Hypertension Brother   . Stroke Brother   . Diabetes Brother     Past Surgical History:  Procedure Laterality Date  . ABDOMINAL HYSTERECTOMY     1985  . BAND HEMORRHOIDECTOMY     2010  . CHOLECYSTECTOMY     1950  . PAROTIDECTOMY     30 years ago   Social History   Occupational History  . Occupation: retired  Tobacco Use  . Smoking status: Never Smoker  .  Smokeless tobacco: Never Used  Substance and Sexual Activity  . Alcohol use: No  . Drug use: No  . Sexual activity: Not on file

## 2021-03-16 ENCOUNTER — Other Ambulatory Visit: Payer: Self-pay

## 2021-03-22 ENCOUNTER — Telehealth: Payer: Self-pay | Admitting: Orthopaedic Surgery

## 2021-03-22 DIAGNOSIS — Z9889 Other specified postprocedural states: Secondary | ICD-10-CM

## 2021-03-22 NOTE — Telephone Encounter (Signed)
Is this ok?

## 2021-03-22 NOTE — Telephone Encounter (Signed)
Error

## 2021-03-22 NOTE — Telephone Encounter (Signed)
Pt called and asked for a new referral for physical  Therapy. CB [phone]

## 2021-03-22 NOTE — Addendum Note (Signed)
Addended by: Barbette Or on: 03/22/2021 04:50 PM   Modules accepted: Orders

## 2021-03-22 NOTE — Telephone Encounter (Signed)
That will be fine.  They can work on generalized strengthening and conditioning.

## 2021-03-22 NOTE — Telephone Encounter (Signed)
Lvm for pt to call back to inform 

## 2021-03-22 NOTE — Telephone Encounter (Signed)
Referral placed in chart  

## 2021-03-23 ENCOUNTER — Telehealth: Payer: Self-pay | Admitting: Orthopaedic Surgery

## 2021-03-23 NOTE — Telephone Encounter (Signed)
Returned call informed about PT order

## 2021-03-23 NOTE — Telephone Encounter (Signed)
Patient called returning a call to Autumn H. Please call patient at [phone].

## 2021-03-30 ENCOUNTER — Encounter: Payer: Self-pay | Admitting: Rehabilitative and Restorative Service Providers"

## 2021-03-30 ENCOUNTER — Ambulatory Visit (INDEPENDENT_AMBULATORY_CARE_PROVIDER_SITE_OTHER): Payer: Medicare PPO | Admitting: Rehabilitative and Restorative Service Providers"

## 2021-03-30 ENCOUNTER — Other Ambulatory Visit: Payer: Self-pay

## 2021-03-30 DIAGNOSIS — M545 Low back pain, unspecified: Secondary | ICD-10-CM

## 2021-03-30 DIAGNOSIS — R293 Abnormal posture: Secondary | ICD-10-CM | POA: Diagnosis not present

## 2021-03-30 DIAGNOSIS — M25551 Pain in right hip: Secondary | ICD-10-CM

## 2021-03-30 DIAGNOSIS — R262 Difficulty in walking, not elsewhere classified: Secondary | ICD-10-CM

## 2021-03-30 DIAGNOSIS — M6281 Muscle weakness (generalized): Secondary | ICD-10-CM

## 2021-03-30 DIAGNOSIS — G8929 Other chronic pain: Secondary | ICD-10-CM

## 2021-03-30 DIAGNOSIS — M25562 Pain in left knee: Secondary | ICD-10-CM

## 2021-03-30 NOTE — Patient Instructions (Signed)
Access Code: [email or web address] URL: [email or web address] Date: 03/30/2021 Prepared by: Chyrel Masson  Exercises Gastroc Stretch on Wall - 1 x daily - 3 x weekly - 1 sets - 3 reps - 30 hold Seated Hamstring Stretch - 2 x daily - 6 x weekly - 3 reps - 1 sets - 30 hold Sit to Stand without Arm Support - 2 x daily - 6 x weekly - 3-4 sets - 5 reps Standing Single Leg Stance with Counter Support - 2 x daily - 6 x weekly - 1 sets - 4-5 reps - 10 sec hold Seated Straight Leg Heel Taps - 1-2 x daily - 7 x weekly - 3 sets - 10 reps Standing Lumbar Extension - 2 x daily - 7 x weekly - 2-3 sets - 5-10 reps

## 2021-03-30 NOTE — Therapy (Signed)
[facility] Physical Therapy [address] [place], [place], [address] Phone: [phone]   Fax:  [phone]  Physical Therapy Evaluation  Patient Details  Name: Teresa Martinez MRN: [id number] Date of Birth: [birth year] Referring Provider (PT): Dr. Magnus Ivan   Encounter Date: 03/30/2021   PT End of Session - 03/30/21 0846    Visit Number 1    Number of Visits 12    Date for PT Re-Evaluation 06/08/21   extended humana timeline   Authorization Type [employer] 12 visits request until 06/08/2021    Progress Note Due on Visit 10    PT Start Time 0845    PT Stop Time 0920    PT Time Calculation (min) 35 min    Activity Tolerance Patient tolerated treatment well    Behavior During Therapy [facility] for tasks assessed/performed           Past Medical History:  Diagnosis Date  . Cervical spine degeneration    C5/C6  . Chronic right shoulder pain   . Hemiparesis affecting dominant side as late effect of cerebrovascular accident (HCC) 12/07/2014  . Hyperlipemia   . Hypertension   . Menopausal syndrome    [name] Buell)  . Overactive bladder   . Statin intolerance     Past Surgical History:  Procedure Laterality Date  . ABDOMINAL HYSTERECTOMY     1985  . BAND HEMORRHOIDECTOMY     2010  . CHOLECYSTECTOMY     1950  . PAROTIDECTOMY     30 years ago    There were no vitals filed for this visit.    Subjective Assessment - 03/30/21 0852    Subjective Pt. comes to clinic with continued complaints of Lt knee pain c a few months onset of Rt lower lumbar/Rt hip.    Limitations Standing;Walking    Diagnostic tests xray    Patient Stated Goals Reduce pain    Currently in Pain? Yes    Pain Score 8    pain at worst   Pain Location Back    Pain Orientation Right;Lower    Pain Descriptors / Indicators Aching;Sore    Pain Type Chronic pain    Pain Radiating Towards Rt hip    Pain Onset More than a month ago    Pain Frequency Intermittent    Aggravating  Factors  walking, standing prolonged, bending/leaning    Pain Relieving Factors rest    Effect of Pain on Daily Activities standing, walking    Multiple Pain Sites Yes    Pain Score 8    Pain Location Knee    Pain Orientation Left    Pain Descriptors / Indicators Sore;Aching    Pain Type Chronic pain    Pain Onset More than a month ago    Pain Frequency Intermittent    Aggravating Factors  transfers after sitting prolonged, standing, squat    Pain Relieving Factors sitting rest    Effect of Pain on Daily Activities standing, walking, stairs, squats              [facility] PT Assessment - 03/30/21 0001      Assessment   Medical Diagnosis low back pain, Rt hip pain, Lt knee pain    Referring Provider (PT) Dr. Magnus Ivan    Onset Date/Surgical Date 12/19/20   about 2-3 months   Hand Dominance Right      Precautions   Precautions None      Balance Screen   Has the patient fallen in the past  6 months Yes    How many times? 1    Has the patient had a decrease in activity level because of a fear of falling?  No    Is the patient reluctant to leave their home because of a fear of falling?  No      Home Environment   Living Environment Private residence    Living Arrangements Spouse/significant other    Additional Comments stairs to entry 6 steps with rails on both sides (not sure to be able to reach) for front door, 3 steps in back door      Prior Function   Level of Independence Independent      Observation/Other Assessments   Focus on Therapeutic Outcomes (FOTO)  intake 44, predicted 47      Functional Tests   Functional tests Sit to Stand;Single leg stance      Single Leg Stance   Comments Rt SLS < 3 seconds, Lt SLS 8 seconds      Sit to Stand   Comments 18 inch chair s UE assist c Lt knee pain reported      Posture/Postural Control   Posture Comments reduced lordosis in standing, equal iliac crest assessment in standing      ROM / Strength   AROM / PROM / Strength  Strength;PROM;AROM      AROM   AROM Assessment Site Lumbar;Knee;Hip    Right/Left Hip Right;Left    Right/Left Knee Left;Right    Left Knee Flexion 130   in sitting   Lumbar Flexion movmeent to toes c stiffness in Rt lumbar/posterior hip    Lumbar Extension 50% WFL c stiffness, repeated in standing x 5: improved to 75% WFL    Lumbar - Right Side Bend movement to Lt femoral epicondyle c Lt lumbar pain    Lumbar - Left Side Bend movement to Rt femoral epicondyle c Rt lumbar pain      Strength   Strength Assessment Site Hip;Knee;Ankle    Right/Left Hip Left;Right    Right Hip Flexion 5/5    Left Hip Flexion 5/5    Right/Left Knee Left;Right    Right Knee Flexion 5/5    Right Knee Extension 5/5    Left Knee Flexion 5/5    Left Knee Extension 4/5    Right/Left Ankle Left;Right    Right Ankle Dorsiflexion 5/5    Left Ankle Dorsiflexion 5/5      Palpation   Palpation comment No specific tenderness noted in lumbar region palpation      Transfers   Five time sit to stand comments  19.69   attempt 3 took several attempts for success     Ambulation/Gait   Gait Comments SPC use in Rt UE c reduced stance on Lt knee, mild trunk lean to Rt in Rt leg stance                      Objective measurements completed on examination: See above findings.       [facility] Adult PT Treatment/Exercise - 03/30/21 0001      Exercises   Exercises Other Exercises;Lumbar;Knee/Hip    Other Exercises  HEP instruction/performance c cues for techniques, handout provided.  Trial set performed of each for comprehension and symptom assessment.  HEP consisting of gastroc strech at wall bilateral, sit to stands, seated SLR, single leg stance at counter, standing lumbar extension      Knee/Hip Exercises: Aerobic   Nustep Lvl 6 10 mins  PT Education - 03/30/21 0846    Education Details HEP, POC    Person(s) Educated Patient    Methods Explanation;Demonstration;Verbal  cues;Handout    Comprehension Verbalized understanding;Returned demonstration            PT Short Term Goals - 03/30/21 0847      PT SHORT TERM GOAL #1   Title Patient will demonstrate independent use of home exercise program to maintain progress from in clinic treatments.    Time 3    Period Weeks    Status New    Target Date 04/20/21             PT Long Term Goals - 03/30/21 0924      PT LONG TERM GOAL #1   Title Patient will demonstrate/report pain at worst less than or equal to 2/10 to facilitate minimal limitation in daily activity secondary to pain symptoms.    Time 10    Period Weeks    Status New    Target Date 06/08/21      PT LONG TERM GOAL #2   Title Patient will demonstrate independent use of home exercise program to facilitate ability to maintain/progress functional gains from skilled physical therapy services.    Time 10    Period Weeks    Status New    Target Date 06/08/21      PT LONG TERM GOAL #3   Title Pt. will demonstrate FOTO outcome > or = 47 to indicate reduced disability due to condition.    Time 10    Period Weeks    Status New    Target Date 06/08/21      PT LONG TERM GOAL #4   Title Pt. will demonstrate lumbar ext > or = 75% WFL s symptoms to facilitate upright standing, walking at PLOF s limitation due to back symptoms.    Time 10    Period Weeks    Status New    Target Date 06/08/21      PT LONG TERM GOAL #5   Title Pt. will demonstrate Lt knee MMT 5/5 throughout to facilitate transfers, squats, stairs at PLOF.    Time 10    Period Weeks    Status New    Target Date 06/08/21      Additional Long Term Goals   Additional Long Term Goals Yes      PT LONG TERM GOAL #6   Title Pt. will demonstrate bialteral SLS > 10 seconds to improve stability in ambulation on even and uneven surface.    Time 10    Period Weeks    Status New    Target Date 06/08/21                  Plan - 03/30/21 [id number]    Clinical Impression  Statement Patient is a 80 y.o. who comes to clinic with complaints of back/Rt hip pain, Lt knee pain with mobility, strength and movement coordination deficits that impair their ability to perform usual daily and recreational functional activities without increase difficulty/symptoms at this time.  Patient to benefit from skilled PT services to address impairments and limitations to improve to previous level of function without restriction secondary to condition.    Personal Factors and Comorbidities Comorbidity 3+    Comorbidities Lt knee scope 06/01/20  DDD lumbar stenosis, ITB syndrome right side, rt shoulder adhesive capsulitis, CVA rt Hemiparesis, HTN, L3-5 laminectomy 2019,    Examination-Activity Limitations Sit;Sleep;Squat;Bend;Stairs;Stand;Transfers;Locomotion Level    Examination-Participation  Restrictions Community Activity;Cleaning;Laundry;Other;Interpersonal Relationship   caregiver status   Stability/Clinical Decision Making Evolving/Moderate complexity    Clinical Decision Making Moderate    Rehab Potential --   fair to good   PT Frequency --   1-2x/week   PT Duration Other (comment)   10 weeks   PT Treatment/Interventions ADLs/Self Care Home Management;Cryotherapy;Electrical Stimulation;Iontophoresis 4mg /ml Dexamethasone;Moist Heat;Traction;Balance training;Therapeutic exercise;Therapeutic activities;Functional mobility training;Stair training;Gait training;Ultrasound;DME Instruction;Neuromuscular re-education;Patient/family education;Passive range of motion;Spinal Manipulations;Joint Manipulations;Dry needling;Taping;Manual techniques    PT Next Visit Plan Improve lumbar mobility (manual, ther ex), assess lateral/posterior hip strength, LE strengthening exercises    PT Home Exercise Plan [email or web address]    Consulted and Agree with Plan of Care Patient           Patient will benefit from skilled therapeutic intervention in order to improve the following deficits and impairments:  Abnormal  gait,Decreased endurance,Hypomobility,Pain,Increased edema,Decreased strength,Decreased activity tolerance,Decreased balance,Decreased mobility,Difficulty walking,Improper body mechanics,Impaired perceived functional ability,Impaired flexibility,Postural dysfunction,Decreased coordination,Decreased range of motion  Visit Diagnosis: Acute right-sided low back pain without sciatica  Pain in right hip  Chronic pain of left knee  Muscle weakness (generalized)  Difficulty in walking, not elsewhere classified  Abnormal posture     Problem List Patient Active Problem List   Diagnosis Date Noted  . Preop testing 07/09/2018  . Degenerative lumbar spinal stenosis 05/12/2018  . Statin myopathy 08/29/2016  . Iliotibial band syndrome of right side 04/23/2016  . Acromioclavicular arthrosis 06/30/2015  . Left carpal tunnel syndrome 05/15/2015  . Adhesive capsulitis of right shoulder 02/06/2015  . Dysarthria due to cerebrovascular accident 01/09/2015  . Hemiparesis affecting dominant side as late effect of cerebrovascular accident (HCC) 12/07/2014  . Spastic neurogenic bladder 10/31/2014  . Right rotator cuff tendonitis 10/19/2014  . Stenosis of cervical spine region   . Chronic right shoulder pain   . Cerebral infarction due to thrombosis of left middle cerebral artery (HCC)   . Left pontine stroke (HCC)   . TIA (transient ischemic attack) 10/14/2014  . Overactive bladder 10/14/2014  . H/O: CVA (cerebrovascular accident) 10/14/2014  . NECK PAIN, CHRONIC 10/29/2010  . Essential hypertension 10/10/2009  . INSOMNIA 09/15/2009  . Pure hypercholesterolemia 11/28/2008  . MENOPAUSAL SYNDROME 11/28/2008   01/26/2009, PT, DPT, OCS, ATC 03/30/21  9:28 AM  Referring diagnosis? M54.50 Treatment diagnosis? (if different than referring diagnosis) M54.50, M25.551 What was this (referring dx) caused by? []  Surgery []  Fall [x]  Ongoing issue []  Arthritis []  Other:  ____________  Laterality: []  Rt []  Lt [x]  Both  Check all possible CPT codes:      [x]  97110 (Therapeutic Exercise)  []  92507 (SLP Treatment)  []  97112 (Neuro Re-ed)   []  92526 (Swallowing Treatment)   [x]  97116 (Gait Training)   []  04/01/21 (Cognitive Training, 1st 15 minutes) [x]  97140 (Manual Therapy)   []  97130 (Cognitive Training, each add'l 15 minutes)  [x]  97530 (Therapeutic Activities)  []  Other, List CPT Code ____________    []  97535 (Self Care)       []  All codes above (97110 - 97535)  [x]  97012 (Mechanical Traction)  [x]  97014 (E-stim Unattended)  []  97032 (E-stim manual)  [x]  97033 (Ionto)  [x]  97035 (Ultrasound)  []  97760 (Orthotic Fit) [x]  (Physical Performance Training) []  (Aquatic Therapy) []  (Contrast Bath) []  (Paraffin) []  97597 (Wound Care 1st 20 sq cm) []  97598 (Wound Care each add'l 20 sq cm) []  97016 (Vasopneumatic Device) []   ) []  (Prosthetic Training)   [facility] [address] [place], [place], [address] Phone: [phone]   Fax:  [phone]  Name: Teresa Martinez MRN: [id number] Date of Birth: May 05, 1941

## 2021-04-06 ENCOUNTER — Ambulatory Visit (INDEPENDENT_AMBULATORY_CARE_PROVIDER_SITE_OTHER): Payer: Medicare PPO | Admitting: Rehabilitative and Restorative Service Providers"

## 2021-04-06 ENCOUNTER — Other Ambulatory Visit: Payer: Self-pay

## 2021-04-06 ENCOUNTER — Encounter: Payer: Self-pay | Admitting: Rehabilitative and Restorative Service Providers"

## 2021-04-06 DIAGNOSIS — M545 Low back pain, unspecified: Secondary | ICD-10-CM

## 2021-04-06 DIAGNOSIS — G8929 Other chronic pain: Secondary | ICD-10-CM

## 2021-04-06 DIAGNOSIS — R293 Abnormal posture: Secondary | ICD-10-CM

## 2021-04-06 DIAGNOSIS — R262 Difficulty in walking, not elsewhere classified: Secondary | ICD-10-CM | POA: Diagnosis not present

## 2021-04-06 DIAGNOSIS — M6281 Muscle weakness (generalized): Secondary | ICD-10-CM

## 2021-04-06 DIAGNOSIS — M25662 Stiffness of left knee, not elsewhere classified: Secondary | ICD-10-CM

## 2021-04-06 DIAGNOSIS — M25562 Pain in left knee: Secondary | ICD-10-CM

## 2021-04-06 DIAGNOSIS — R600 Localized edema: Secondary | ICD-10-CM

## 2021-04-06 DIAGNOSIS — M25551 Pain in right hip: Secondary | ICD-10-CM

## 2021-04-06 NOTE — Therapy (Signed)
[facility] Physical Therapy [address] [place], [place], [address] Phone: [phone]   Fax:  [phone]  Physical Therapy Treatment  Patient Details  Name: Teresa Martinez MRN: [id number] Date of Birth: 09/18/1941 Referring Provider (PT): Dr. Magnus Ivan   Encounter Date: 04/06/2021   PT End of Session - 04/06/21 1449    Visit Number 2    Number of Visits 12    Date for PT Re-Evaluation 06/08/21    Progress Note Due on Visit 10    PT Start Time 0155    PT Stop Time 0245    PT Time Calculation (min) 50 min    Activity Tolerance Patient tolerated treatment well;No increased pain    Behavior During Therapy WFL for tasks assessed/performed           Past Medical History:  Diagnosis Date  . Cervical spine degeneration    C5/C6  . Chronic right shoulder pain   . Hemiparesis affecting dominant side as late effect of cerebrovascular accident (HCC) 12/07/2014  . Hyperlipemia   . Hypertension   . Menopausal syndrome    [name] Burnett)  . Overactive bladder   . Statin intolerance     Past Surgical History:  Procedure Laterality Date  . ABDOMINAL HYSTERECTOMY     1985  . BAND HEMORRHOIDECTOMY     2010  . CHOLECYSTECTOMY     1950  . PAROTIDECTOMY     30 years ago    There were no vitals filed for this visit.   Subjective Assessment - 04/06/21 1403    Subjective I am doing ok; it's still there.    Currently in Pain? Yes    Pain Score 7     Pain Location Back    Pain Orientation Right;Lower    Pain Descriptors / Indicators Sore    Pain Type Chronic pain                             [facility] Adult PT Treatment/Exercise - 04/06/21 0001      Lumbar Exercises: Quadruped   Other Quadruped Lumbar Exercises prayer stretch in neutral and then each side x 30 sec x 2 bouts after Nustep and again at treatment end same parameters      Knee/Hip Exercises: Aerobic   Nustep level 7 x 7 min      Knee/Hip Exercises: Standing    Other Standing Knee Exercises Airex: lateral step downs, retro steps downs x 20 each; combo lateral/retro step downs x 20 each at parallel bar. SLR off of 6 inch step in front of patient x 20 each LE while standing on Airex on floor. side stepping across the gym floor x 2 bouts with concentration on balance and control. all performed bil      Knee/Hip Exercises: Supine   Other Supine Knee/Hip Exercises hamstring stretch 2x30 sec each LE      Knee/Hip Exercises: Prone   Other Prone Exercises glute set/hip ext x 20. 1 lb hamstring curl  x 15. performed bil                    PT Short Term Goals - 04/06/21 1452      PT SHORT TERM GOAL #1   Title Patient will demonstrate independent use of home exercise program to maintain progress from in clinic treatments.    Status On-going             PT Long  Term Goals - 03/30/21 0924      PT LONG TERM GOAL #1   Title Patient will demonstrate/report pain at worst less than or equal to 2/10 to facilitate minimal limitation in daily activity secondary to pain symptoms.    Time 10    Period Weeks    Status New    Target Date 06/08/21      PT LONG TERM GOAL #2   Title Patient will demonstrate independent use of home exercise program to facilitate ability to maintain/progress functional gains from skilled physical therapy services.    Time 10    Period Weeks    Status New    Target Date 06/08/21      PT LONG TERM GOAL #3   Title Pt. will demonstrate FOTO outcome > or = 47 to indicate reduced disability due to condition.    Time 10    Period Weeks    Status New    Target Date 06/08/21      PT LONG TERM GOAL #4   Title Pt. will demonstrate lumbar ext > or = 75% WFL s symptoms to facilitate upright standing, walking at PLOF s limitation due to back symptoms.    Time 10    Period Weeks    Status New    Target Date 06/08/21      PT LONG TERM GOAL #5   Title Pt. will demonstrate Lt knee MMT 5/5 throughout to facilitate transfers,  squats, stairs at PLOF.    Time 10    Period Weeks    Status New    Target Date 06/08/21      Additional Long Term Goals   Additional Long Term Goals Yes      PT LONG TERM GOAL #6   Title Pt. will demonstrate bialteral SLS > 10 seconds to improve stability in ambulation on even and uneven surface.    Time 10    Period Weeks    Status New    Target Date 06/08/21                 Plan - 04/06/21 1450    Clinical Impression Statement Pt continues to have complaints of back/Rt hip pain, Lt knee pain with mobility, strength and movement coordination deficits that impair their ability to perform usual daily and recreational functional activities without increase difficulty/symptoms at this time. Patient to benefit from skilled PT services to address impairments and limitations to improve to previous level of function without restriction secondary to condition. Pt required maximal verbal cues throughout session to decrease cadence of exercises to reap the benefits of strengthening. Pt reports the exercises today "worked the kink out of my hip."    PT Treatment/Interventions ADLs/Self Care Home Management;Cryotherapy;Electrical Stimulation;Iontophoresis 4mg /ml Dexamethasone;Moist Heat;Traction;Balance training;Therapeutic exercise;Therapeutic activities;Functional mobility training;Stair training;Gait training;Ultrasound;DME Instruction;Neuromuscular re-education;Patient/family education;Passive range of motion;Spinal Manipulations;Joint Manipulations;Dry needling;Taping;Manual techniques    PT Next Visit Plan Improve lumbar mobility (manual, ther ex), assess lateral/posterior hip strength, LE strengthening exercises, review HEP    Consulted and Agree with Plan of Care Patient           Patient will benefit from skilled therapeutic intervention in order to improve the following deficits and impairments:  Abnormal gait,Decreased endurance,Hypomobility,Pain,Increased edema,Decreased  strength,Decreased activity tolerance,Decreased balance,Decreased mobility,Difficulty walking,Improper body mechanics,Impaired perceived functional ability,Impaired flexibility,Postural dysfunction,Decreased coordination,Decreased range of motion  Visit Diagnosis: Acute right-sided low back pain without sciatica  Pain in right hip  Chronic pain of left knee  Muscle weakness (generalized)  Difficulty  in walking, not elsewhere classified  Abnormal posture  Acute pain of left knee  Stiffness of left knee, not elsewhere classified  Localized edema     Problem List Patient Active Problem List   Diagnosis Date Noted  . Preop testing 07/09/2018  . Degenerative lumbar spinal stenosis 05/12/2018  . Statin myopathy 08/29/2016  . Iliotibial band syndrome of right side 04/23/2016  . Acromioclavicular arthrosis 06/30/2015  . Left carpal tunnel syndrome 05/15/2015  . Adhesive capsulitis of right shoulder 02/06/2015  . Dysarthria due to cerebrovascular accident 01/09/2015  . Hemiparesis affecting dominant side as late effect of cerebrovascular accident (HCC) 12/07/2014  . Spastic neurogenic bladder 10/31/2014  . Right rotator cuff tendonitis 10/19/2014  . Stenosis of cervical spine region   . Chronic right shoulder pain   . Cerebral infarction due to thrombosis of left middle cerebral artery (HCC)   . Left pontine stroke (HCC)   . TIA (transient ischemic attack) 10/14/2014  . Overactive bladder 10/14/2014  . H/O: CVA (cerebrovascular accident) 10/14/2014  . NECK PAIN, CHRONIC 10/29/2010  . Essential hypertension 10/10/2009  . INSOMNIA 09/15/2009  . Pure hypercholesterolemia 11/28/2008  . MENOPAUSAL SYNDROME 11/28/2008    Luna Fuse, PT, DPT 04/06/2021, 2:54 PM  [facility] Physical Therapy [address] [place], [place], [address] Phone: [phone]   Fax:  [phone]  Name: Teresa Martinez MRN: [id number] Date of Birth: [birth year]

## 2021-04-10 ENCOUNTER — Ambulatory Visit (INDEPENDENT_AMBULATORY_CARE_PROVIDER_SITE_OTHER): Payer: Medicare PPO | Admitting: Rehabilitative and Restorative Service Providers"

## 2021-04-10 ENCOUNTER — Encounter: Payer: Self-pay | Admitting: Rehabilitative and Restorative Service Providers"

## 2021-04-10 ENCOUNTER — Other Ambulatory Visit: Payer: Self-pay

## 2021-04-10 DIAGNOSIS — G8929 Other chronic pain: Secondary | ICD-10-CM | POA: Diagnosis not present

## 2021-04-10 DIAGNOSIS — M25562 Pain in left knee: Secondary | ICD-10-CM | POA: Diagnosis not present

## 2021-04-10 DIAGNOSIS — R293 Abnormal posture: Secondary | ICD-10-CM

## 2021-04-10 DIAGNOSIS — M25551 Pain in right hip: Secondary | ICD-10-CM

## 2021-04-10 DIAGNOSIS — M545 Low back pain, unspecified: Secondary | ICD-10-CM

## 2021-04-10 DIAGNOSIS — R262 Difficulty in walking, not elsewhere classified: Secondary | ICD-10-CM

## 2021-04-10 DIAGNOSIS — M6281 Muscle weakness (generalized): Secondary | ICD-10-CM

## 2021-04-10 NOTE — Therapy (Signed)
[facility] Physical Therapy [address] [place], [place], [address] Phone: [phone]   Fax:  [phone]  Physical Therapy Treatment  Patient Details  Name: Teresa Martinez MRN: [id number] Date of Birth: 25-May-1941 Referring Provider (PT): Dr. Magnus Ivan   Encounter Date: 04/10/2021   PT End of Session - 04/10/21 1518    Visit Number 3    Number of Visits 12    Date for PT Re-Evaluation 06/08/21    Authorization Type [employer] 12 visits request until 06/08/2021    Authorization - Visit Number 3    Progress Note Due on Visit 10    PT Start Time 1507    PT Stop Time 1547    PT Time Calculation (min) 40 min    Activity Tolerance Patient tolerated treatment well    Behavior During Therapy [facility] for tasks assessed/performed           Past Medical History:  Diagnosis Date  . Cervical spine degeneration    C5/C6  . Chronic right shoulder pain   . Hemiparesis affecting dominant side as late effect of cerebrovascular accident (HCC) 12/07/2014  . Hyperlipemia   . Hypertension   . Menopausal syndrome    [name])  . Overactive bladder   . Statin intolerance     Past Surgical History:  Procedure Laterality Date  . ABDOMINAL HYSTERECTOMY     1985  . BAND HEMORRHOIDECTOMY     2010  . CHOLECYSTECTOMY     1950  . PAROTIDECTOMY     30 years ago    There were no vitals filed for this visit.   Subjective Assessment - 04/10/21 1517    Subjective Pt. stated doing exercises every day and feeling like she is doing some better.  No pain today at rest, 4-5/10 in both areas today when moving/standing at times.    Currently in Pain? Yes    Pain Score 5    at worst   Pain Location Back    Pain Orientation Right    Pain Descriptors / Indicators Sore;Aching    Pain Type Chronic pain    Pain Onset More than a month ago    Pain Frequency Intermittent    Aggravating Factors  walking, standing    Pain Relieving Factors rest, exercise    Pain Score 4    at worst   Pain Location Knee    Pain Orientation Left    Pain Descriptors / Indicators Aching;Sore    Pain Type Chronic pain    Pain Onset More than a month ago    Pain Frequency Intermittent    Aggravating Factors  standing    Pain Relieving Factors rest, exercise                             [facility] Adult PT Treatment/Exercise - 04/10/21 0001      Neuro Re-ed    Neuro Re-ed Details  lateral step on/over foam pad x 10 c hand assist on bar, tandem stance on foam 1 min x 1 bilateral      Knee/Hip Exercises: Aerobic   Nustep Lvl 7 10 mins UE/LE      Knee/Hip Exercises: Machines for Strengthening   Cybex Knee Extension eccentric lowering 2 x 10 15 lbs, performed bilateral    Total Gym Leg Press Double leg 100 lbs 2 x15, single leg 2 x 15 bilateral 62 lbs      Knee/Hip Exercises: Seated  Other Seated Knee/Hip Exercises SLR 2 x 10 bilateral                    PT Short Term Goals - 04/06/21 1452      PT SHORT TERM GOAL #1   Title Patient will demonstrate independent use of home exercise program to maintain progress from in clinic treatments.    Status On-going             PT Long Term Goals - 03/30/21 5498      PT LONG TERM GOAL #1   Title Patient will demonstrate/report pain at worst less than or equal to 2/10 to facilitate minimal limitation in daily activity secondary to pain symptoms.    Time 10    Period Weeks    Status New    Target Date 06/08/21      PT LONG TERM GOAL #2   Title Patient will demonstrate independent use of home exercise program to facilitate ability to maintain/progress functional gains from skilled physical therapy services.    Time 10    Period Weeks    Status New    Target Date 06/08/21      PT LONG TERM GOAL #3   Title Pt. will demonstrate FOTO outcome > or = 47 to indicate reduced disability due to condition.    Time 10    Period Weeks    Status New    Target Date 06/08/21      PT LONG TERM GOAL #4    Title Pt. will demonstrate lumbar ext > or = 75% WFL s symptoms to facilitate upright standing, walking at PLOF s limitation due to back symptoms.    Time 10    Period Weeks    Status New    Target Date 06/08/21      PT LONG TERM GOAL #5   Title Pt. will demonstrate Lt knee MMT 5/5 throughout to facilitate transfers, squats, stairs at PLOF.    Time 10    Period Weeks    Status New    Target Date 06/08/21      Additional Long Term Goals   Additional Long Term Goals Yes      PT LONG TERM GOAL #6   Title Pt. will demonstrate bialteral SLS > 10 seconds to improve stability in ambulation on even and uneven surface.    Time 10    Period Weeks    Status New    Target Date 06/08/21                 Plan - 04/10/21 1544    Clinical Impression Statement Continued c cues for improve control in interventions for improved strengthening.  Overall gait still shows deficits in stance on Lt LE due to symptoms.  Continued skilled PT services indicated at this time.    Personal Factors and Comorbidities Comorbidity 3+    Comorbidities Lt knee scope 06/01/20  DDD lumbar stenosis, ITB syndrome right side, rt shoulder adhesive capsulitis, CVA rt Hemiparesis, HTN, L3-5 laminectomy 2019,    Examination-Activity Limitations Sit;Sleep;Squat;Bend;Stairs;Stand;Transfers;Locomotion Level    Examination-Participation Restrictions Psychiatric nurse;Other;Interpersonal Relationship    Stability/Clinical Decision Making Evolving/Moderate complexity    Clinical Decision Making Moderate    PT Treatment/Interventions ADLs/Self Care Home Management;Cryotherapy;Electrical Stimulation;Iontophoresis 4mg /ml Dexamethasone;Moist Heat;Traction;Balance training;Therapeutic exercise;Therapeutic activities;Functional mobility training;Stair training;Gait training;Ultrasound;DME Instruction;Neuromuscular re-education;Patient/family education;Passive range of motion;Spinal Manipulations;Joint  Manipulations;Dry needling;Taping;Manual techniques    PT Next Visit Plan Continue LE strengthening, eccentric control improvements, Lumbar and hip  mobility intervention for symptom control prn.    PT Home Exercise Plan [email or web address]    Consulted and Agree with Plan of Care Patient           Patient will benefit from skilled therapeutic intervention in order to improve the following deficits and impairments:  Abnormal gait,Decreased endurance,Hypomobility,Pain,Increased edema,Decreased strength,Decreased activity tolerance,Decreased balance,Decreased mobility,Difficulty walking,Improper body mechanics,Impaired perceived functional ability,Impaired flexibility,Postural dysfunction,Decreased coordination,Decreased range of motion  Visit Diagnosis: Acute right-sided low back pain without sciatica  Pain in right hip  Chronic pain of left knee  Muscle weakness (generalized)  Difficulty in walking, not elsewhere classified  Abnormal posture     Problem List Patient Active Problem List   Diagnosis Date Noted  . Preop testing 07/09/2018  . Degenerative lumbar spinal stenosis 05/12/2018  . Statin myopathy 08/29/2016  . Iliotibial band syndrome of right side 04/23/2016  . Acromioclavicular arthrosis 06/30/2015  . Left carpal tunnel syndrome 05/15/2015  . Adhesive capsulitis of right shoulder 02/06/2015  . Dysarthria due to cerebrovascular accident 01/09/2015  . Hemiparesis affecting dominant side as late effect of cerebrovascular accident (HCC) 12/07/2014  . Spastic neurogenic bladder 10/31/2014  . Right rotator cuff tendonitis 10/19/2014  . Stenosis of cervical spine region   . Chronic right shoulder pain   . Cerebral infarction due to thrombosis of left middle cerebral artery (HCC)   . Left pontine stroke (HCC)   . TIA (transient ischemic attack) 10/14/2014  . Overactive bladder 10/14/2014  . H/O: CVA (cerebrovascular accident) 10/14/2014  . NECK PAIN, CHRONIC 10/29/2010  .  Essential hypertension 10/10/2009  . INSOMNIA 09/15/2009  . Pure hypercholesterolemia 11/28/2008  . MENOPAUSAL SYNDROME 11/28/2008    Chyrel Masson, PT, DPT, OCS, ATC 04/10/21  3:47 PM    [facility] Physical Therapy [address] [place], [place], [address] Phone: [phone]   Fax:  [phone]  Name: Teresa Martinez MRN: [id number] Date of Birth: 1940-12-17

## 2021-04-12 ENCOUNTER — Other Ambulatory Visit: Payer: Self-pay

## 2021-04-12 ENCOUNTER — Ambulatory Visit (INDEPENDENT_AMBULATORY_CARE_PROVIDER_SITE_OTHER): Payer: Medicare PPO | Admitting: Rehabilitative and Restorative Service Providers"

## 2021-04-12 ENCOUNTER — Encounter: Payer: Self-pay | Admitting: Rehabilitative and Restorative Service Providers"

## 2021-04-12 DIAGNOSIS — R262 Difficulty in walking, not elsewhere classified: Secondary | ICD-10-CM

## 2021-04-12 DIAGNOSIS — M25562 Pain in left knee: Secondary | ICD-10-CM | POA: Diagnosis not present

## 2021-04-12 DIAGNOSIS — M25551 Pain in right hip: Secondary | ICD-10-CM | POA: Diagnosis not present

## 2021-04-12 DIAGNOSIS — M6281 Muscle weakness (generalized): Secondary | ICD-10-CM

## 2021-04-12 DIAGNOSIS — G8929 Other chronic pain: Secondary | ICD-10-CM

## 2021-04-12 DIAGNOSIS — M545 Low back pain, unspecified: Secondary | ICD-10-CM | POA: Diagnosis not present

## 2021-04-12 DIAGNOSIS — R293 Abnormal posture: Secondary | ICD-10-CM

## 2021-04-12 NOTE — Therapy (Signed)
[facility] Physical Therapy [address] [place], [place], [address] Phone: [phone]   Fax:  [phone]  Physical Therapy Treatment  Patient Details  Name: Teresa Martinez MRN: [id number] Date of Birth: May 29, 1941 Referring Provider (PT): Dr. Magnus Ivan   Encounter Date: 04/12/2021   PT End of Session - 04/12/21 1522    Visit Number 4    Number of Visits 12    Date for PT Re-Evaluation 06/08/21    Authorization Type [employer] 12 visits request until 06/08/2021    Authorization - Visit Number 4    Progress Note Due on Visit 10    PT Start Time 1509    PT Stop Time 1548    PT Time Calculation (min) 39 min    Activity Tolerance Patient tolerated treatment well    Behavior During Therapy [facility] for tasks assessed/performed           Past Medical History:  Diagnosis Date  . Cervical spine degeneration    C5/C6  . Chronic right shoulder pain   . Hemiparesis affecting dominant side as late effect of cerebrovascular accident (HCC) 12/07/2014  . Hyperlipemia   . Hypertension   . Menopausal syndrome    [name])  . Overactive bladder   . Statin intolerance     Past Surgical History:  Procedure Laterality Date  . ABDOMINAL HYSTERECTOMY     1985  . BAND HEMORRHOIDECTOMY     2010  . CHOLECYSTECTOMY     1950  . PAROTIDECTOMY     30 years ago    There were no vitals filed for this visit.   Subjective Assessment - 04/12/21 1521    Subjective Pt. indicated no specific pain upon arrival today at rest.  Noted some complaints in back/hip after sitting prolonged.    Currently in Pain? No/denies    Pain Score 0-No pain    Pain Onset More than a month ago    Multiple Pain Sites Yes    Pain Score 0    Pain Onset More than a month ago              [facility] PT Assessment - 04/12/21 0001      Assessment   Medical Diagnosis low back pain, Rt hip pain, Lt knee pain    Referring Provider (PT) Dr. Magnus Ivan    Onset Date/Surgical Date 12/19/20     Hand Dominance Right      AROM   Lumbar Extension 75% WFL      Ambulation/Gait   Gait Comments Able to ambulate on level surfaces in clinic independently                         [facility] Adult PT Treatment/Exercise - 04/12/21 0001      Neuro Re-ed    Neuro Re-ed Details  tandem ambulation fwd/back 15 ft x 5 each way, tandem stance on foam 1 min x 2 bilateral      Lumbar Exercises: Stretches   Other Lumbar Stretch Exercise standing lumbar extension x 6      Knee/Hip Exercises: Aerobic   Nustep Lvl 7 10 mins UE/LE      Knee/Hip Exercises: Machines for Strengthening   Cybex Knee Extension eccentric lowering 3 x 10 15 lbs, performed bilateral    Cybex Knee Flexion single leg 20 lbs 2 x 10 bilaterally      Knee/Hip Exercises: Seated   Sit to Sand without UE support;2 sets;10 reps  slow eccentric lowering                   PT Short Term Goals - 04/06/21 1452      PT SHORT TERM GOAL #1   Title Patient will demonstrate independent use of home exercise program to maintain progress from in clinic treatments.    Status On-going             PT Long Term Goals - 03/30/21 8016      PT LONG TERM GOAL #1   Title Patient will demonstrate/report pain at worst less than or equal to 2/10 to facilitate minimal limitation in daily activity secondary to pain symptoms.    Time 10    Period Weeks    Status New    Target Date 06/08/21      PT LONG TERM GOAL #2   Title Patient will demonstrate independent use of home exercise program to facilitate ability to maintain/progress functional gains from skilled physical therapy services.    Time 10    Period Weeks    Status New    Target Date 06/08/21      PT LONG TERM GOAL #3   Title Pt. will demonstrate FOTO outcome > or = 47 to indicate reduced disability due to condition.    Time 10    Period Weeks    Status New    Target Date 06/08/21      PT LONG TERM GOAL #4   Title Pt. will demonstrate lumbar ext > or  = 75% WFL s symptoms to facilitate upright standing, walking at PLOF s limitation due to back symptoms.    Time 10    Period Weeks    Status New    Target Date 06/08/21      PT LONG TERM GOAL #5   Title Pt. will demonstrate Lt knee MMT 5/5 throughout to facilitate transfers, squats, stairs at PLOF.    Time 10    Period Weeks    Status New    Target Date 06/08/21      Additional Long Term Goals   Additional Long Term Goals Yes      PT LONG TERM GOAL #6   Title Pt. will demonstrate bialteral SLS > 10 seconds to improve stability in ambulation on even and uneven surface.    Time 10    Period Weeks    Status New    Target Date 06/08/21                 Plan - 04/12/21 1544    Clinical Impression Statement Encouraged patient to perform lumbar extension stretching after prolonged sitting if relief is found by movement.  Continued plan for LE strengthening and balance improvements.    Personal Factors and Comorbidities Comorbidity 3+    Comorbidities Lt knee scope 06/01/20  DDD lumbar stenosis, ITB syndrome right side, rt shoulder adhesive capsulitis, CVA rt Hemiparesis, HTN, L3-5 laminectomy 2019,    Examination-Activity Limitations Sit;Sleep;Squat;Bend;Stairs;Stand;Transfers;Locomotion Level    Examination-Participation Restrictions Psychiatric nurse;Other;Interpersonal Relationship    Stability/Clinical Decision Making Evolving/Moderate complexity    PT Treatment/Interventions ADLs/Self Care Home Management;Cryotherapy;Electrical Stimulation;Iontophoresis 4mg /ml Dexamethasone;Moist Heat;Traction;Balance training;Therapeutic exercise;Therapeutic activities;Functional mobility training;Stair training;Gait training;Ultrasound;DME Instruction;Neuromuscular re-education;Patient/family education;Passive range of motion;Spinal Manipulations;Joint Manipulations;Dry needling;Taping;Manual techniques    PT Next Visit Plan Continue LE strengthening, eccentric control Lumbar  and hip mobility intervention for symptom control prn. recheck extension movement reaction    PT Home Exercise Plan [email or web address]    Consulted and Agree  with Plan of Care Patient           Patient will benefit from skilled therapeutic intervention in order to improve the following deficits and impairments:  Abnormal gait,Decreased endurance,Hypomobility,Pain,Increased edema,Decreased strength,Decreased activity tolerance,Decreased balance,Decreased mobility,Difficulty walking,Improper body mechanics,Impaired perceived functional ability,Impaired flexibility,Postural dysfunction,Decreased coordination,Decreased range of motion  Visit Diagnosis: Acute right-sided low back pain without sciatica  Pain in right hip  Chronic pain of left knee  Muscle weakness (generalized)  Difficulty in walking, not elsewhere classified  Abnormal posture     Problem List Patient Active Problem List   Diagnosis Date Noted  . Preop testing 07/09/2018  . Degenerative lumbar spinal stenosis 05/12/2018  . Statin myopathy 08/29/2016  . Iliotibial band syndrome of right side 04/23/2016  . Acromioclavicular arthrosis 06/30/2015  . Left carpal tunnel syndrome 05/15/2015  . Adhesive capsulitis of right shoulder 02/06/2015  . Dysarthria due to cerebrovascular accident 01/09/2015  . Hemiparesis affecting dominant side as late effect of cerebrovascular accident (HCC) 12/07/2014  . Spastic neurogenic bladder 10/31/2014  . Right rotator cuff tendonitis 10/19/2014  . Stenosis of cervical spine region   . Chronic right shoulder pain   . Cerebral infarction due to thrombosis of left middle cerebral artery (HCC)   . Left pontine stroke (HCC)   . TIA (transient ischemic attack) 10/14/2014  . Overactive bladder 10/14/2014  . H/O: CVA (cerebrovascular accident) 10/14/2014  . NECK PAIN, CHRONIC 10/29/2010  . Essential hypertension 10/10/2009  . INSOMNIA 09/15/2009  . Pure hypercholesterolemia 11/28/2008  .  MENOPAUSAL SYNDROME 11/28/2008    Chyrel Masson, PT, DPT, OCS, ATC 04/12/21  3:50 PM    [facility] Physical Therapy [address] [place], [place], [address] Phone: [phone]   Fax:  [phone]  Name: PALESTINE MOSCO MRN: [id number] Date of Birth: [birth year]

## 2021-04-18 ENCOUNTER — Other Ambulatory Visit: Payer: Self-pay

## 2021-04-18 ENCOUNTER — Encounter: Payer: Self-pay | Admitting: Rehabilitative and Restorative Service Providers"

## 2021-04-18 ENCOUNTER — Ambulatory Visit (INDEPENDENT_AMBULATORY_CARE_PROVIDER_SITE_OTHER): Payer: Medicare PPO | Admitting: Rehabilitative and Restorative Service Providers"

## 2021-04-18 DIAGNOSIS — M25562 Pain in left knee: Secondary | ICD-10-CM

## 2021-04-18 DIAGNOSIS — M25551 Pain in right hip: Secondary | ICD-10-CM | POA: Diagnosis not present

## 2021-04-18 DIAGNOSIS — M545 Low back pain, unspecified: Secondary | ICD-10-CM

## 2021-04-18 DIAGNOSIS — R293 Abnormal posture: Secondary | ICD-10-CM

## 2021-04-18 DIAGNOSIS — R262 Difficulty in walking, not elsewhere classified: Secondary | ICD-10-CM

## 2021-04-18 DIAGNOSIS — G8929 Other chronic pain: Secondary | ICD-10-CM

## 2021-04-18 DIAGNOSIS — M6281 Muscle weakness (generalized): Secondary | ICD-10-CM | POA: Diagnosis not present

## 2021-04-18 NOTE — Therapy (Signed)
[facility] Physical Therapy [address] [place], [place], [address] Phone: [phone]   Fax:  [phone]  Physical Therapy Treatment  Patient Details  Name: Teresa Martinez MRN: [id number] Date of Birth: 03/04/1941 Referring Provider (PT): Dr. Magnus Ivan   Encounter Date: 04/18/2021   PT End of Session - 04/18/21 1523    Visit Number 5    Number of Visits 12    Date for PT Re-Evaluation 06/08/21    Authorization Type [employer] 12 visits request until 06/08/2021    Authorization - Visit Number 5    Progress Note Due on Visit 10    PT Start Time 1512    PT Stop Time 1552    PT Time Calculation (min) 40 min    Activity Tolerance Patient tolerated treatment well    Behavior During Therapy [facility] for tasks assessed/performed           Past Medical History:  Diagnosis Date  . Cervical spine degeneration    C5/C6  . Chronic right shoulder pain   . Hemiparesis affecting dominant side as late effect of cerebrovascular accident (HCC) 12/07/2014  . Hyperlipemia   . Hypertension   . Menopausal syndrome    [name])  . Overactive bladder   . Statin intolerance     Past Surgical History:  Procedure Laterality Date  . ABDOMINAL HYSTERECTOMY     1985  . BAND HEMORRHOIDECTOMY     2010  . CHOLECYSTECTOMY     1950  . PAROTIDECTOMY     30 years ago    There were no vitals filed for this visit.   Subjective Assessment - 04/18/21 1521    Subjective Pt. indicated pains for hip/back at worst 3/10 or so since last visit.    Currently in Pain? Yes    Pain Score 3    3/10 at worst since last visit   Pain Location Back    Pain Orientation Right    Pain Descriptors / Indicators Sore;Aching    Pain Type Chronic pain    Pain Onset More than a month ago    Pain Frequency Intermittent    Aggravating Factors  none specific reported    Pain Relieving Factors exercise and rest    Pain Score 0    Pain Onset More than a month ago                              [facility] Adult PT Treatment/Exercise - 04/18/21 0001      Knee/Hip Exercises: Aerobic   Nustep Lvl 7 12 mins UE/LE      Knee/Hip Exercises: Machines for Strengthening   Cybex Knee Extension eccentric lowering 3 x 10 15 lbs, performed bilateral    Cybex Knee Flexion single leg 20 lbs 2 x 10 bilaterally    Total Gym Leg Press Double leg 112 lbs 3 x 10, single leg 2 x 10 bilateral 62 lbs      Knee/Hip Exercises: Standing   Other Standing Knee Exercises extension, abduction leg kicks 2 x 10 bilateral                    PT Short Term Goals - 04/06/21 1452      PT SHORT TERM GOAL #1   Title Patient will demonstrate independent use of home exercise program to maintain progress from in clinic treatments.    Status On-going  PT Long Term Goals - 03/30/21 3295      PT LONG TERM GOAL #1   Title Patient will demonstrate/report pain at worst less than or equal to 2/10 to facilitate minimal limitation in daily activity secondary to pain symptoms.    Time 10    Period Weeks    Status New    Target Date 06/08/21      PT LONG TERM GOAL #2   Title Patient will demonstrate independent use of home exercise program to facilitate ability to maintain/progress functional gains from skilled physical therapy services.    Time 10    Period Weeks    Status New    Target Date 06/08/21      PT LONG TERM GOAL #3   Title Pt. will demonstrate FOTO outcome > or = 47 to indicate reduced disability due to condition.    Time 10    Period Weeks    Status New    Target Date 06/08/21      PT LONG TERM GOAL #4   Title Pt. will demonstrate lumbar ext > or = 75% WFL s symptoms to facilitate upright standing, walking at PLOF s limitation due to back symptoms.    Time 10    Period Weeks    Status New    Target Date 06/08/21      PT LONG TERM GOAL #5   Title Pt. will demonstrate Lt knee MMT 5/5 throughout to facilitate transfers, squats, stairs at  PLOF.    Time 10    Period Weeks    Status New    Target Date 06/08/21      Additional Long Term Goals   Additional Long Term Goals Yes      PT LONG TERM GOAL #6   Title Pt. will demonstrate bialteral SLS > 10 seconds to improve stability in ambulation on even and uneven surface.    Time 10    Period Weeks    Status New    Target Date 06/08/21                 Plan - 04/18/21 1540    Clinical Impression Statement Pt. has reported and demonstrated improvements in symptom management c use of stretching/exercise in home routine.  Current focus today was on LE strengthening for leg complaints to improve ambulation/standing    Personal Factors and Comorbidities Comorbidity 3+    Comorbidities Lt knee scope 06/01/20  DDD lumbar stenosis, ITB syndrome right side, rt shoulder adhesive capsulitis, CVA rt Hemiparesis, HTN, L3-5 laminectomy 2019,    Examination-Activity Limitations Sit;Sleep;Squat;Bend;Stairs;Stand;Transfers;Locomotion Level    Examination-Participation Restrictions Psychiatric nurse;Other;Interpersonal Relationship    Stability/Clinical Decision Making Evolving/Moderate complexity    PT Treatment/Interventions ADLs/Self Care Home Management;Cryotherapy;Electrical Stimulation;Iontophoresis 4mg /ml Dexamethasone;Moist Heat;Traction;Balance training;Therapeutic exercise;Therapeutic activities;Functional mobility training;Stair training;Gait training;Ultrasound;DME Instruction;Neuromuscular re-education;Patient/family education;Passive range of motion;Spinal Manipulations;Joint Manipulations;Dry needling;Taping;Manual techniques    PT Next Visit Plan Continue LE strengthening, eccentric control, balance interventions for movement coordination improvements.    PT Home Exercise Plan [email or web address]    Consulted and Agree with Plan of Care Patient           Patient will benefit from skilled therapeutic intervention in order to improve the following deficits and  impairments:  Abnormal gait,Decreased endurance,Hypomobility,Pain,Increased edema,Decreased strength,Decreased activity tolerance,Decreased balance,Decreased mobility,Difficulty walking,Improper body mechanics,Impaired perceived functional ability,Impaired flexibility,Postural dysfunction,Decreased coordination,Decreased range of motion  Visit Diagnosis: Acute right-sided low back pain without sciatica  Pain in right hip  Chronic pain of left knee  Muscle  weakness (generalized)  Difficulty in walking, not elsewhere classified  Abnormal posture     Problem List Patient Active Problem List   Diagnosis Date Noted  . Preop testing 07/09/2018  . Degenerative lumbar spinal stenosis 05/12/2018  . Statin myopathy 08/29/2016  . Iliotibial band syndrome of right side 04/23/2016  . Acromioclavicular arthrosis 06/30/2015  . Left carpal tunnel syndrome 05/15/2015  . Adhesive capsulitis of right shoulder 02/06/2015  . Dysarthria due to cerebrovascular accident 01/09/2015  . Hemiparesis affecting dominant side as late effect of cerebrovascular accident (HCC) 12/07/2014  . Spastic neurogenic bladder 10/31/2014  . Right rotator cuff tendonitis 10/19/2014  . Stenosis of cervical spine region   . Chronic right shoulder pain   . Cerebral infarction due to thrombosis of left middle cerebral artery (HCC)   . Left pontine stroke (HCC)   . TIA (transient ischemic attack) 10/14/2014  . Overactive bladder 10/14/2014  . H/O: CVA (cerebrovascular accident) 10/14/2014  . NECK PAIN, CHRONIC 10/29/2010  . Essential hypertension 10/10/2009  . INSOMNIA 09/15/2009  . Pure hypercholesterolemia 11/28/2008  . MENOPAUSAL SYNDROME 11/28/2008    Chyrel Masson, PT, DPT, OCS, ATC 04/18/21  3:48 PM    [facility] Physical Therapy [address] [place], [place], [address] Phone: [phone]   Fax:  [phone]  Name: Teresa Martinez MRN: [id number] Date of Birth:  1941/09/21

## 2021-04-20 ENCOUNTER — Encounter: Payer: Self-pay | Admitting: Rehabilitative and Restorative Service Providers"

## 2021-04-20 ENCOUNTER — Other Ambulatory Visit: Payer: Self-pay

## 2021-04-20 ENCOUNTER — Ambulatory Visit (INDEPENDENT_AMBULATORY_CARE_PROVIDER_SITE_OTHER): Payer: Medicare PPO | Admitting: Rehabilitative and Restorative Service Providers"

## 2021-04-20 DIAGNOSIS — M25562 Pain in left knee: Secondary | ICD-10-CM | POA: Diagnosis not present

## 2021-04-20 DIAGNOSIS — M545 Low back pain, unspecified: Secondary | ICD-10-CM | POA: Diagnosis not present

## 2021-04-20 DIAGNOSIS — M6281 Muscle weakness (generalized): Secondary | ICD-10-CM

## 2021-04-20 DIAGNOSIS — R293 Abnormal posture: Secondary | ICD-10-CM

## 2021-04-20 DIAGNOSIS — R262 Difficulty in walking, not elsewhere classified: Secondary | ICD-10-CM | POA: Diagnosis not present

## 2021-04-20 DIAGNOSIS — M25551 Pain in right hip: Secondary | ICD-10-CM | POA: Diagnosis not present

## 2021-04-20 DIAGNOSIS — G8929 Other chronic pain: Secondary | ICD-10-CM | POA: Diagnosis not present

## 2021-04-20 NOTE — Therapy (Signed)
[facility] [address] [place], [place], [address] Phone: [phone]   Fax:  [phone]  Physical Therapy Treatment  Patient Details  Name: Teresa Martinez MRN: [id number] Date of Birth: 1941-07-20 Referring Provider (PT): Dr. Magnus Ivan   Encounter Date: 04/20/2021   PT End of Session - 04/20/21 0907    Visit Number 6    Number of Visits 12    Date for PT Re-Evaluation 06/08/21    Authorization Type [employer] 12 visits request until 06/08/2021    Authorization - Visit Number 6    Progress Note Due on Visit 10    PT Start Time 0843    PT Stop Time 0923    PT Time Calculation (min) 40 min    Activity Tolerance Patient tolerated treatment well    Behavior During Therapy [facility] for tasks assessed/performed           Past Medical History:  Diagnosis Date  . Cervical spine degeneration    C5/C6  . Chronic right shoulder pain   . Hemiparesis affecting dominant side as late effect of cerebrovascular accident (HCC) 12/07/2014  . Hyperlipemia   . Hypertension   . Menopausal syndrome    [name])  . Overactive bladder   . Statin intolerance     Past Surgical History:  Procedure Laterality Date  . ABDOMINAL HYSTERECTOMY     1985  . BAND HEMORRHOIDECTOMY     2010  . CHOLECYSTECTOMY     1950  . PAROTIDECTOMY     30 years ago    There were no vitals filed for this visit.   Subjective Assessment - 04/20/21 0904    Subjective Pt. indicated stumble onto Lt knee this morning (no contact with floor, just loaded leg quickly), rating pain 5/10 or so.  Pt. indicated she was walking along and stumbled in house.    Currently in Pain? Yes    Pain Score 0-No pain    Pain Onset More than a month ago    Pain Score 5    Pain Location Knee    Pain Orientation Left    Pain Descriptors / Indicators Aching;Sore    Pain Type Chronic pain    Pain Onset More than a month ago    Pain Frequency Intermittent    Aggravating Factors  pain  from loading leg in stumble    Pain Relieving Factors rest, some movement in clinic today helped                             [facility] Adult PT Treatment/Exercise - 04/20/21 0001      Neuro Re-ed    Neuro Re-ed Details  tandem stance on foam 1 min x 2 bilateral c occasional hand assist      Knee/Hip Exercises: Aerobic   Nustep Lvl 7 10 mins UE/LE      Knee/Hip Exercises: Machines for Strengthening   Cybex Knee Extension eccentric lowering 3 x 10 15 lbs, performed bilateral    Total Gym Leg Press Double leg 112 lbs 3 x 10, single leg 3 x 10 bilateral 62 lbs      Knee/Hip Exercises: Standing   Forward Step Up 2 sets;10 reps;Step Height: 6";Both;Hand Hold: 2                    PT Short Term Goals - 04/06/21 1452      PT SHORT TERM GOAL #1  Title Patient will demonstrate independent use of home exercise program to maintain progress from in clinic treatments.    Status On-going             PT Long Term Goals - 03/30/21 2707      PT LONG TERM GOAL #1   Title Patient will demonstrate/report pain at worst less than or equal to 2/10 to facilitate minimal limitation in daily activity secondary to pain symptoms.    Time 10    Period Weeks    Status New    Target Date 06/08/21      PT LONG TERM GOAL #2   Title Patient will demonstrate independent use of home exercise program to facilitate ability to maintain/progress functional gains from skilled physical therapy services.    Time 10    Period Weeks    Status New    Target Date 06/08/21      PT LONG TERM GOAL #3   Title Pt. will demonstrate FOTO outcome > or = 47 to indicate reduced disability due to condition.    Time 10    Period Weeks    Status New    Target Date 06/08/21      PT LONG TERM GOAL #4   Title Pt. will demonstrate lumbar ext > or = 75% WFL s symptoms to facilitate upright standing, walking at PLOF s limitation due to back symptoms.    Time 10    Period Weeks    Status New     Target Date 06/08/21      PT LONG TERM GOAL #5   Title Pt. will demonstrate Lt knee MMT 5/5 throughout to facilitate transfers, squats, stairs at PLOF.    Time 10    Period Weeks    Status New    Target Date 06/08/21      Additional Long Term Goals   Additional Long Term Goals Yes      PT LONG TERM GOAL #6   Title Pt. will demonstrate bialteral SLS > 10 seconds to improve stability in ambulation on even and uneven surface.    Time 10    Period Weeks    Status New    Target Date 06/08/21                 Plan - 04/20/21 0916    Clinical Impression Statement Mild complaints of pain increase on Lt knee after reported stumble this morning.  Symptoms seemed to improve c movement in clinic today.  No specific reporting of Rt hip/back symptoms today, still showing progress in that location.  Continued strengthening to LE bilateral to improve weight acceptance and improve functional mobility warranted.    Personal Factors and Comorbidities Comorbidity 3+    Comorbidities Lt knee scope 06/01/20  DDD lumbar stenosis, ITB syndrome right side, rt shoulder adhesive capsulitis, CVA rt Hemiparesis, HTN, L3-5 laminectomy 2019,    Examination-Activity Limitations Sit;Sleep;Squat;Bend;Stairs;Stand;Transfers;Locomotion Level    Examination-Participation Restrictions Psychiatric nurse;Other;Interpersonal Relationship    Stability/Clinical Decision Making Evolving/Moderate complexity    PT Treatment/Interventions ADLs/Self Care Home Management;Cryotherapy;Electrical Stimulation;Iontophoresis 4mg /ml Dexamethasone;Moist Heat;Traction;Balance training;Therapeutic exercise;Therapeutic activities;Functional mobility training;Stair training;Gait training;Ultrasound;DME Instruction;Neuromuscular re-education;Patient/family education;Passive range of motion;Spinal Manipulations;Joint Manipulations;Dry needling;Taping;Manual techniques    PT Next Visit Plan Continue LE strengthening, eccentric  control, balance interventions for movement coordination improvements.  Follow up on Lt knee pain    PT Home Exercise Plan [email or web address]    Consulted and Agree with Plan of Care Patient  Patient will benefit from skilled therapeutic intervention in order to improve the following deficits and impairments:  Abnormal gait,Decreased endurance,Hypomobility,Pain,Increased edema,Decreased strength,Decreased activity tolerance,Decreased balance,Decreased mobility,Difficulty walking,Improper body mechanics,Impaired perceived functional ability,Impaired flexibility,Postural dysfunction,Decreased coordination,Decreased range of motion  Visit Diagnosis: Acute right-sided low back pain without sciatica  Pain in right hip  Chronic pain of left knee  Muscle weakness (generalized)  Abnormal posture  Difficulty in walking, not elsewhere classified     Problem List Patient Active Problem List   Diagnosis Date Noted  . Preop testing 07/09/2018  . Degenerative lumbar spinal stenosis 05/12/2018  . Statin myopathy 08/29/2016  . Iliotibial band syndrome of right side 04/23/2016  . Acromioclavicular arthrosis 06/30/2015  . Left carpal tunnel syndrome 05/15/2015  . Adhesive capsulitis of right shoulder 02/06/2015  . Dysarthria due to cerebrovascular accident 01/09/2015  . Hemiparesis affecting dominant side as late effect of cerebrovascular accident (HCC) 12/07/2014  . Spastic neurogenic bladder 10/31/2014  . Right rotator cuff tendonitis 10/19/2014  . Stenosis of cervical spine region   . Chronic right shoulder pain   . Cerebral infarction due to thrombosis of left middle cerebral artery (HCC)   . Left pontine stroke (HCC)   . TIA (transient ischemic attack) 10/14/2014  . Overactive bladder 10/14/2014  . H/O: CVA (cerebrovascular accident) 10/14/2014  . NECK PAIN, CHRONIC 10/29/2010  . Essential hypertension 10/10/2009  . INSOMNIA 09/15/2009  . Pure hypercholesterolemia 11/28/2008  .  MENOPAUSAL SYNDROME 11/28/2008   Chyrel Masson, PT, DPT, OCS, ATC 04/20/21  9:19 AM    [facility] Physical Therapy [address] [place], [place], [address] Phone: [phone]   Fax:  [phone]  Name: MERISA JULIO MRN: [id number] Date of Birth: Apr 21, 1941

## 2021-04-24 ENCOUNTER — Other Ambulatory Visit: Payer: Self-pay

## 2021-04-24 ENCOUNTER — Encounter: Payer: Self-pay | Admitting: Rehabilitative and Restorative Service Providers"

## 2021-04-24 ENCOUNTER — Ambulatory Visit (INDEPENDENT_AMBULATORY_CARE_PROVIDER_SITE_OTHER): Payer: Medicare PPO | Admitting: Rehabilitative and Restorative Service Providers"

## 2021-04-24 DIAGNOSIS — G8929 Other chronic pain: Secondary | ICD-10-CM | POA: Diagnosis not present

## 2021-04-24 DIAGNOSIS — R293 Abnormal posture: Secondary | ICD-10-CM

## 2021-04-24 DIAGNOSIS — M545 Low back pain, unspecified: Secondary | ICD-10-CM

## 2021-04-24 DIAGNOSIS — R262 Difficulty in walking, not elsewhere classified: Secondary | ICD-10-CM

## 2021-04-24 DIAGNOSIS — M25551 Pain in right hip: Secondary | ICD-10-CM

## 2021-04-24 DIAGNOSIS — M25562 Pain in left knee: Secondary | ICD-10-CM | POA: Diagnosis not present

## 2021-04-24 DIAGNOSIS — M6281 Muscle weakness (generalized): Secondary | ICD-10-CM

## 2021-04-24 NOTE — Therapy (Signed)
[facility] Physical Therapy [address] [place], [place], [address] Phone: [phone]   Fax:  [phone]  Physical Therapy Treatment  Patient Details  Name: Teresa Martinez MRN: [id number] Date of Birth: [birth year] Referring Provider (PT): Dr. Magnus Ivan   Encounter Date: 04/24/2021   PT End of Session - 04/24/21 1432    Visit Number 7    Number of Visits 12    Date for PT Re-Evaluation 06/08/21    Authorization Type [employer] 12 visits request until 06/08/2021    Authorization - Visit Number 7    Progress Note Due on Visit 10    PT Start Time 1425    PT Stop Time 1505    PT Time Calculation (min) 40 min    Activity Tolerance Patient tolerated treatment well    Behavior During Therapy [facility] for tasks assessed/performed           Past Medical History:  Diagnosis Date  . Cervical spine degeneration    C5/C6  . Chronic right shoulder pain   . Hemiparesis affecting dominant side as late effect of cerebrovascular accident (HCC) 12/07/2014  . Hyperlipemia   . Hypertension   . Menopausal syndrome    [name])  . Overactive bladder   . Statin intolerance     Past Surgical History:  Procedure Laterality Date  . ABDOMINAL HYSTERECTOMY     1985  . BAND HEMORRHOIDECTOMY     2010  . CHOLECYSTECTOMY     1950  . PAROTIDECTOMY     30 years ago    There were no vitals filed for this visit.   Subjective Assessment - 04/24/21 1429    Subjective Pt. indicated complaints of knee pain 4/10 at worst, noticed with walking.  Pt. stated 4/10 in hip/back as well at times.  Global Rating of change +6 a great deal better.    Currently in Pain? No/denies   no pain at rest   Pain Score 4    at worst   Pain Location Back    Pain Orientation Right    Pain Descriptors / Indicators Aching;Sore    Pain Type Chronic pain    Pain Onset More than a month ago    Aggravating Factors  getting up in morning    Pain Relieving Factors stretching    Pain Score 4    at worst   Pain Location Knee    Pain Orientation Left    Pain Descriptors / Indicators Aching;Sore    Pain Type Chronic pain    Pain Onset More than a month ago    Pain Frequency Intermittent    Aggravating Factors  standing prolonged              [facility] PT Assessment - 04/24/21 0001      Assessment   Medical Diagnosis low back pain, Rt hip pain, Lt knee pain    Referring Provider (PT) Dr. Magnus Ivan    Onset Date/Surgical Date 12/19/20    Hand Dominance Right      Strength   Right Hip Flexion 5/5    Left Hip Flexion 5/5    Right Knee Flexion 5/5    Right Knee Extension 5/5    Left Knee Flexion 5/5    Left Knee Extension 5/5                         [facility] Adult PT Treatment/Exercise - 04/24/21 0001      Neuro Re-ed  Neuro Re-ed Details  single leg stance c cone touches (anterior, anterior/medial, anterior/lateral) x 5 each bilateral, lateral stepping 3cones x 8 bilateral      Lumbar Exercises: Stretches   Other Lumbar Stretch Exercise seated piriformis figure 4 stretch 15 sec x 3 bilateral    Other Lumbar Stretch Exercise lumbar extension standing x 10      Knee/Hip Exercises: Machines for Strengthening   Cybex Knee Extension eccentric lowering 3 x 10 20 lbs, performed bilateral      Knee/Hip Exercises: Seated   Sit to Sand without UE support;2 sets;10 reps   18 inch chair, slow eccenric lowering focus                   PT Short Term Goals - 04/24/21 1501      PT SHORT TERM GOAL #1   Title Patient will demonstrate independent use of home exercise program to maintain progress from in clinic treatments.    Status Achieved             PT Long Term Goals - 03/30/21 3536      PT LONG TERM GOAL #1   Title Patient will demonstrate/report pain at worst less than or equal to 2/10 to facilitate minimal limitation in daily activity secondary to pain symptoms.    Time 10    Period Weeks    Status New    Target Date 06/08/21      PT LONG  TERM GOAL #2   Title Patient will demonstrate independent use of home exercise program to facilitate ability to maintain/progress functional gains from skilled physical therapy services.    Time 10    Period Weeks    Status New    Target Date 06/08/21      PT LONG TERM GOAL #3   Title Pt. will demonstrate FOTO outcome > or = 47 to indicate reduced disability due to condition.    Time 10    Period Weeks    Status New    Target Date 06/08/21      PT LONG TERM GOAL #4   Title Pt. will demonstrate lumbar ext > or = 75% WFL s symptoms to facilitate upright standing, walking at PLOF s limitation due to back symptoms.    Time 10    Period Weeks    Status New    Target Date 06/08/21      PT LONG TERM GOAL #5   Title Pt. will demonstrate Lt knee MMT 5/5 throughout to facilitate transfers, squats, stairs at PLOF.    Time 10    Period Weeks    Status New    Target Date 06/08/21      Additional Long Term Goals   Additional Long Term Goals Yes      PT LONG TERM GOAL #6   Title Pt. will demonstrate bialteral SLS > 10 seconds to improve stability in ambulation on even and uneven surface.    Time 10    Period Weeks    Status New    Target Date 06/08/21                 Plan - 04/24/21 1500    Clinical Impression Statement Pt. presented today c continued restriction in lumbar extension, hip mobility bilateral that can be addressed through continued skilled PT services and HEP for mobility gains.  Lateral hip strength and balance to help improve functional movement coordination.    Personal Factors and Comorbidities Comorbidity 3+  Comorbidities Lt knee scope 06/01/20  DDD lumbar stenosis, ITB syndrome right side, rt shoulder adhesive capsulitis, CVA rt Hemiparesis, HTN, L3-5 laminectomy 2019,    Examination-Activity Limitations Sit;Sleep;Squat;Bend;Stairs;Stand;Transfers;Locomotion Level    Examination-Participation Restrictions Optician, dispensing;Other;Interpersonal Relationship    Stability/Clinical Decision Making Evolving/Moderate complexity    PT Treatment/Interventions ADLs/Self Care Home Management;Cryotherapy;Electrical Stimulation;Iontophoresis 4mg /ml Dexamethasone;Moist Heat;Traction;Balance training;Therapeutic exercise;Therapeutic activities;Functional mobility training;Stair training;Gait training;Ultrasound;DME Instruction;Neuromuscular re-education;Patient/family education;Passive range of motion;Spinal Manipulations;Joint Manipulations;Dry needling;Taping;Manual techniques    PT Next Visit Plan LE strengthening, movement coodination in dynamic and compliant surface intervention, hip/lumbar mobility intervention.    PT Home Exercise Plan [email or web address]    Consulted and Agree with Plan of Care Patient           Patient will benefit from skilled therapeutic intervention in order to improve the following deficits and impairments:  Abnormal gait,Decreased endurance,Hypomobility,Pain,Increased edema,Decreased strength,Decreased activity tolerance,Decreased balance,Decreased mobility,Difficulty walking,Improper body mechanics,Impaired perceived functional ability,Impaired flexibility,Postural dysfunction,Decreased coordination,Decreased range of motion  Visit Diagnosis: Acute right-sided low back pain without sciatica  Pain in right hip  Chronic pain of left knee  Muscle weakness (generalized)  Abnormal posture  Difficulty in walking, not elsewhere classified     Problem List Patient Active Problem List   Diagnosis Date Noted  . Preop testing 07/09/2018  . Degenerative lumbar spinal stenosis 05/12/2018  . Statin myopathy 08/29/2016  . Iliotibial band syndrome of right side 04/23/2016  . Acromioclavicular arthrosis 06/30/2015  . Left carpal tunnel syndrome 05/15/2015  . Adhesive capsulitis of right shoulder 02/06/2015  . Dysarthria due to cerebrovascular accident 01/09/2015  . Hemiparesis  affecting dominant side as late effect of cerebrovascular accident (HCC) 12/07/2014  . Spastic neurogenic bladder 10/31/2014  . Right rotator cuff tendonitis 10/19/2014  . Stenosis of cervical spine region   . Chronic right shoulder pain   . Cerebral infarction due to thrombosis of left middle cerebral artery (HCC)   . Left pontine stroke (HCC)   . TIA (transient ischemic attack) 10/14/2014  . Overactive bladder 10/14/2014  . H/O: CVA (cerebrovascular accident) 10/14/2014  . NECK PAIN, CHRONIC 10/29/2010  . Essential hypertension 10/10/2009  . INSOMNIA 09/15/2009  . Pure hypercholesterolemia 11/28/2008  . MENOPAUSAL SYNDROME 11/28/2008    01/26/2009, PT, DPT, OCS, ATC 04/24/21  3:02 PM    [facility] Physical Therapy [address] [place], [place], [place] Phone: [phone]   Fax:  [phone]  Name: Teresa Martinez MRN: Teresa [id number] Date of Birth: [birth year]

## 2021-04-27 ENCOUNTER — Encounter: Payer: Self-pay | Admitting: Rehabilitative and Restorative Service Providers"

## 2021-04-27 ENCOUNTER — Other Ambulatory Visit: Payer: Self-pay

## 2021-04-27 ENCOUNTER — Ambulatory Visit (INDEPENDENT_AMBULATORY_CARE_PROVIDER_SITE_OTHER): Payer: Medicare PPO | Admitting: Rehabilitative and Restorative Service Providers"

## 2021-04-27 ENCOUNTER — Encounter: Payer: Medicare PPO | Admitting: Rehabilitative and Restorative Service Providers"

## 2021-04-27 DIAGNOSIS — M545 Low back pain, unspecified: Secondary | ICD-10-CM

## 2021-04-27 DIAGNOSIS — G8929 Other chronic pain: Secondary | ICD-10-CM | POA: Diagnosis not present

## 2021-04-27 DIAGNOSIS — R293 Abnormal posture: Secondary | ICD-10-CM | POA: Diagnosis not present

## 2021-04-27 DIAGNOSIS — R262 Difficulty in walking, not elsewhere classified: Secondary | ICD-10-CM

## 2021-04-27 DIAGNOSIS — M25562 Pain in left knee: Secondary | ICD-10-CM

## 2021-04-27 DIAGNOSIS — M25551 Pain in right hip: Secondary | ICD-10-CM

## 2021-04-27 DIAGNOSIS — M6281 Muscle weakness (generalized): Secondary | ICD-10-CM

## 2021-04-27 NOTE — Therapy (Signed)
[facility] Physical Therapy [address] [place], [place], [address] Phone: [phone]   Fax:  [phone]  Physical Therapy Treatment  Patient Details  Name: Teresa Martinez MRN: [id number] Date of Birth: [birth year] Referring Provider (PT): Dr. Magnus Ivan   Encounter Date: 04/27/2021   PT End of Session - 04/27/21 1354     Visit Number 8    Number of Visits 12    Date for PT Re-Evaluation 06/08/21    Authorization Type [employer] 12 visits request until 06/08/2021    Authorization - Visit Number 8    Progress Note Due on Visit 10    PT Start Time 1345    PT Stop Time 1424    PT Time Calculation (min) 39 min    Activity Tolerance Patient tolerated treatment well    Behavior During Therapy [facility] for tasks assessed/performed             Past Medical History:  Diagnosis Date   Cervical spine degeneration    C5/C6   Chronic right shoulder pain    Hemiparesis affecting dominant side as late effect of cerebrovascular accident (HCC) 12/07/2014   Hyperlipemia    Hypertension    Menopausal syndrome    Floyde Parkins)   Overactive bladder    Statin intolerance     Past Surgical History:  Procedure Laterality Date   ABDOMINAL HYSTERECTOMY     1985   BAND HEMORRHOIDECTOMY     2010   CHOLECYSTECTOMY     1950   PAROTIDECTOMY     30 years ago    There were no vitals filed for this visit.   Subjective Assessment - 04/27/21 1351     Subjective Pt. stated Lt knee hurting when walking, not hurting when not walking.  Pt. indicated Rt back/hip can be severe tightness and pain in morning but exercises make it better to get up and walk after doing them.    Currently in Pain? No/denies   no pain at rest   Pain Onset More than a month ago    Pain Onset More than a month ago                               [facility] Adult PT Treatment/Exercise - 04/27/21 0001       Lumbar Exercises: Stretches   Other Lumbar Stretch Exercise supine  piriformis figure 4 stretch into flexion 30 sec x 5 Rt      Lumbar Exercises: Supine   Bridge 15 reps;5 seconds      Knee/Hip Exercises: Aerobic   Nustep Lvl 6 10 mins UE/LE      Knee/Hip Exercises: Machines for Strengthening   Cybex Knee Extension eccentric lowering 3 x 10 20 lbs, performed bilateral    Cybex Knee Flexion single leg 20 lbs 2 x 15 bilaterally      Manual Therapy   Manual therapy comments Percussive device STM to Rt glute max/med, proximal IT band on Rt                      PT Short Term Goals - 04/24/21 1501       PT SHORT TERM GOAL #1   Title Patient will demonstrate independent use of home exercise program to maintain progress from in clinic treatments.    Status Achieved               PT Long Term Goals -  03/30/21 0924       PT LONG TERM GOAL #1   Title Patient will demonstrate/report pain at worst less than or equal to 2/10 to facilitate minimal limitation in daily activity secondary to pain symptoms.    Time 10    Period Weeks    Status New    Target Date 06/08/21      PT LONG TERM GOAL #2   Title Patient will demonstrate independent use of home exercise program to facilitate ability to maintain/progress functional gains from skilled physical therapy services.    Time 10    Period Weeks    Status New    Target Date 06/08/21      PT LONG TERM GOAL #3   Title Pt. will demonstrate FOTO outcome > or = 47 to indicate reduced disability due to condition.    Time 10    Period Weeks    Status New    Target Date 06/08/21      PT LONG TERM GOAL #4   Title Pt. will demonstrate lumbar ext > or = 75% WFL s symptoms to facilitate upright standing, walking at PLOF s limitation due to back symptoms.    Time 10    Period Weeks    Status New    Target Date 06/08/21      PT LONG TERM GOAL #5   Title Pt. will demonstrate Lt knee MMT 5/5 throughout to facilitate transfers, squats, stairs at PLOF.    Time 10    Period Weeks    Status New     Target Date 06/08/21      Additional Long Term Goals   Additional Long Term Goals Yes      PT LONG TERM GOAL #6   Title Pt. will demonstrate bialteral SLS > 10 seconds to improve stability in ambulation on even and uneven surface.    Time 10    Period Weeks    Status New    Target Date 06/08/21                   Plan - 04/27/21 1406     Clinical Impression Statement Inclusion today of soft tissue mobilization intervention to posterior/lateral Rt hip to attempt to improve tightness/stiffness complaints from mornings.  Localized tenderness noted from Trigger points in area.  Hip joint mobility passively was limited bilateral in multidirections and could be part of presentation of morning complaints.    Personal Factors and Comorbidities Comorbidity 3+    Comorbidities Lt knee scope 06/01/20  DDD lumbar stenosis, ITB syndrome right side, rt shoulder adhesive capsulitis, CVA rt Hemiparesis, HTN, L3-5 laminectomy 2019,    Examination-Activity Limitations Sit;Sleep;Squat;Bend;Stairs;Stand;Transfers;Locomotion Level    Examination-Participation Restrictions Psychiatric nurse;Other;Interpersonal Relationship    Stability/Clinical Decision Making Evolving/Moderate complexity    PT Treatment/Interventions ADLs/Self Care Home Management;Cryotherapy;Electrical Stimulation;Iontophoresis 4mg /ml Dexamethasone;Moist Heat;Traction;Balance training;Therapeutic exercise;Therapeutic activities;Functional mobility training;Stair training;Gait training;Ultrasound;DME Instruction;Neuromuscular re-education;Patient/family education;Passive range of motion;Spinal Manipulations;Joint Manipulations;Dry needling;Taping;Manual techniques    PT Next Visit Plan Possible STM, hip joint mobilizations for mobility gains in Rt hip    PT Home Exercise Plan [email or web address]    Consulted and Agree with Plan of Care Patient             Patient will benefit from skilled therapeutic intervention in  order to improve the following deficits and impairments:  Abnormal gait, Decreased endurance, Hypomobility, Pain, Increased edema, Decreased strength, Decreased activity tolerance, Decreased balance, Decreased mobility, Difficulty walking, Improper body mechanics, Impaired perceived functional ability,  Impaired flexibility, Postural dysfunction, Decreased coordination, Decreased range of motion  Visit Diagnosis: Acute right-sided low back pain without sciatica  Pain in right hip  Chronic pain of left knee  Muscle weakness (generalized)  Abnormal posture  Difficulty in walking, not elsewhere classified     Problem List Patient Active Problem List   Diagnosis Date Noted   Preop testing 07/09/2018   Degenerative lumbar spinal stenosis 05/12/2018   Statin myopathy 08/29/2016   Iliotibial band syndrome of right side 04/23/2016   Acromioclavicular arthrosis 06/30/2015   Left carpal tunnel syndrome 05/15/2015   Adhesive capsulitis of right shoulder 02/06/2015   Dysarthria due to cerebrovascular accident 01/09/2015   Hemiparesis affecting dominant side as late effect of cerebrovascular accident (HCC) 12/07/2014   Spastic neurogenic bladder 10/31/2014   Right rotator cuff tendonitis 10/19/2014   Stenosis of cervical spine region    Chronic right shoulder pain    Cerebral infarction due to thrombosis of left middle cerebral artery (HCC)    Left pontine stroke (HCC)    TIA (transient ischemic attack) 10/14/2014   Overactive bladder 10/14/2014   H/O: CVA (cerebrovascular accident) 10/14/2014   NECK PAIN, CHRONIC 10/29/2010   Essential hypertension 10/10/2009   INSOMNIA 09/15/2009   Pure hypercholesterolemia 11/28/2008   MENOPAUSAL SYNDROME 11/28/2008   Chyrel Masson, PT, DPT, OCS, ATC 04/27/21  2:19 PM    [facility] Physical Therapy [address] [place], [place], [address] Phone: [phone]   Fax:  [phone]  Name: Teresa Martinez MRN:  [id number] Date of Birth: [birth year]

## 2021-05-01 ENCOUNTER — Encounter: Payer: Self-pay | Admitting: Rehabilitative and Restorative Service Providers"

## 2021-05-01 ENCOUNTER — Other Ambulatory Visit: Payer: Self-pay

## 2021-05-01 ENCOUNTER — Ambulatory Visit (INDEPENDENT_AMBULATORY_CARE_PROVIDER_SITE_OTHER): Payer: Medicare PPO | Admitting: Rehabilitative and Restorative Service Providers"

## 2021-05-01 DIAGNOSIS — G8929 Other chronic pain: Secondary | ICD-10-CM | POA: Diagnosis not present

## 2021-05-01 DIAGNOSIS — M6281 Muscle weakness (generalized): Secondary | ICD-10-CM | POA: Diagnosis not present

## 2021-05-01 DIAGNOSIS — M545 Low back pain, unspecified: Secondary | ICD-10-CM

## 2021-05-01 DIAGNOSIS — M25551 Pain in right hip: Secondary | ICD-10-CM | POA: Diagnosis not present

## 2021-05-01 DIAGNOSIS — R262 Difficulty in walking, not elsewhere classified: Secondary | ICD-10-CM

## 2021-05-01 DIAGNOSIS — M25562 Pain in left knee: Secondary | ICD-10-CM

## 2021-05-01 DIAGNOSIS — R293 Abnormal posture: Secondary | ICD-10-CM | POA: Diagnosis not present

## 2021-05-01 NOTE — Therapy (Signed)
[facility] Physical Therapy [address] [place], [place], [address] Phone: [phone]   Fax:  [phone]  Physical Therapy Treatment  Patient Details  Name: Teresa Martinez MRN: [id number] Date of Birth: [birth year] Referring Provider (PT): Dr. Magnus Ivan   Encounter Date: 05/01/2021   PT End of Session - 05/01/21 1445     Visit Number 9    Number of Visits 12    Date for PT Re-Evaluation 06/08/21    Authorization Type [employer] 12 visits request until 06/08/2021    Authorization - Visit Number 9    Progress Note Due on Visit 10    PT Start Time 1429    PT Stop Time 1509    PT Time Calculation (min) 40 min    Activity Tolerance Patient tolerated treatment well    Behavior During Therapy [facility] for tasks assessed/performed             Past Medical History:  Diagnosis Date   Cervical spine degeneration    C5/C6   Chronic right shoulder pain    Hemiparesis affecting dominant side as late effect of cerebrovascular accident (HCC) 12/07/2014   Hyperlipemia    Hypertension    Menopausal syndrome    Floyde Parkins)   Overactive bladder    Statin intolerance     Past Surgical History:  Procedure Laterality Date   ABDOMINAL HYSTERECTOMY     1985   BAND HEMORRHOIDECTOMY     2010   CHOLECYSTECTOMY     1950   PAROTIDECTOMY     30 years ago    There were no vitals filed for this visit.   Subjective Assessment - 05/01/21 1444     Subjective Pt. indicated Rt hip seemed to do better since last visit in morning (still noted).  no pain upon arrival reported.    Currently in Pain? No/denies    Pain Score 0-No pain    Pain Onset More than a month ago    Pain Score 0    Pain Onset More than a month ago                               [facility] Adult PT Treatment/Exercise - 05/01/21 0001       Lumbar Exercises: Supine   Bridge 5 seconds;10 reps      Lumbar Exercises: Sidelying   Clam Both   3 x 15 bilateral     Knee/Hip  Exercises: Aerobic   Nustep Lvl 6 10 mins UE/LE      Knee/Hip Exercises: Machines for Strengthening   Cybex Knee Extension eccentric lowering 2x 15 20 lbs, performed bilateral      Manual Therapy   Manual therapy comments Percussive device STM to Rt glute max/med, proximal IT band on Rt.  G3 inferior hip jt mobs, lateral hip joint mobs for Rt hip mobility gains.                      PT Short Term Goals - 04/24/21 1501       PT SHORT TERM GOAL #1   Title Patient will demonstrate independent use of home exercise program to maintain progress from in clinic treatments.    Status Achieved               PT Long Term Goals - 03/30/21 4315       PT LONG TERM GOAL #1   Title Patient will  demonstrate/report pain at worst less than or equal to 2/10 to facilitate minimal limitation in daily activity secondary to pain symptoms.    Time 10    Period Weeks    Status New    Target Date 06/08/21      PT LONG TERM GOAL #2   Title Patient will demonstrate independent use of home exercise program to facilitate ability to maintain/progress functional gains from skilled physical therapy services.    Time 10    Period Weeks    Status New    Target Date 06/08/21      PT LONG TERM GOAL #3   Title Pt. will demonstrate FOTO outcome > or = 47 to indicate reduced disability due to condition.    Time 10    Period Weeks    Status New    Target Date 06/08/21      PT LONG TERM GOAL #4   Title Pt. will demonstrate lumbar ext > or = 75% WFL s symptoms to facilitate upright standing, walking at PLOF s limitation due to back symptoms.    Time 10    Period Weeks    Status New    Target Date 06/08/21      PT LONG TERM GOAL #5   Title Pt. will demonstrate Lt knee MMT 5/5 throughout to facilitate transfers, squats, stairs at PLOF.    Time 10    Period Weeks    Status New    Target Date 06/08/21      Additional Long Term Goals   Additional Long Term Goals Yes      PT LONG TERM  GOAL #6   Title Pt. will demonstrate bialteral SLS > 10 seconds to improve stability in ambulation on even and uneven surface.    Time 10    Period Weeks    Status New    Target Date 06/08/21                   Plan - 05/01/21 1454     Clinical Impression Statement Rt hip joint mobility improved in flexion, ER/IR post manual intervention.  Continued incorporation of improved strength for LE and mobility to facilitate improved functional reporting.    Personal Factors and Comorbidities Comorbidity 3+    Comorbidities Lt knee scope 06/01/20  DDD lumbar stenosis, ITB syndrome right side, rt shoulder adhesive capsulitis, CVA rt Hemiparesis, HTN, L3-5 laminectomy 2019,    Examination-Activity Limitations Sit;Sleep;Squat;Bend;Stairs;Stand;Transfers;Locomotion Level    Examination-Participation Restrictions Psychiatric nurse;Other;Interpersonal Relationship    Stability/Clinical Decision Making Evolving/Moderate complexity    PT Treatment/Interventions ADLs/Self Care Home Management;Cryotherapy;Electrical Stimulation;Iontophoresis 4mg /ml Dexamethasone;Moist Heat;Traction;Balance training;Therapeutic exercise;Therapeutic activities;Functional mobility training;Stair training;Gait training;Ultrasound;DME Instruction;Neuromuscular re-education;Patient/family education;Passive range of motion;Spinal Manipulations;Joint Manipulations;Dry needling;Taping;Manual techniques    PT Next Visit Plan Progress note next visit    PT Home Exercise Plan [email or web address]    Consulted and Agree with Plan of Care Patient             Patient will benefit from skilled therapeutic intervention in order to improve the following deficits and impairments:  Abnormal gait, Decreased endurance, Hypomobility, Pain, Increased edema, Decreased strength, Decreased activity tolerance, Decreased balance, Decreased mobility, Difficulty walking, Improper body mechanics, Impaired perceived functional ability,  Impaired flexibility, Postural dysfunction, Decreased coordination, Decreased range of motion  Visit Diagnosis: Acute right-sided low back pain without sciatica  Pain in right hip  Chronic pain of left knee  Muscle weakness (generalized)  Abnormal posture  Difficulty in walking, not elsewhere classified  Problem List Patient Active Problem List   Diagnosis Date Noted   Preop testing 07/09/2018   Degenerative lumbar spinal stenosis 05/12/2018   Statin myopathy 08/29/2016   Iliotibial band syndrome of right side 04/23/2016   Acromioclavicular arthrosis 06/30/2015   Left carpal tunnel syndrome 05/15/2015   Adhesive capsulitis of right shoulder 02/06/2015   Dysarthria due to cerebrovascular accident 01/09/2015   Hemiparesis affecting dominant side as late effect of cerebrovascular accident (HCC) 12/07/2014   Spastic neurogenic bladder 10/31/2014   Right rotator cuff tendonitis 10/19/2014   Stenosis of cervical spine region    Chronic right shoulder pain    Cerebral infarction due to thrombosis of left middle cerebral artery (HCC)    Left pontine stroke (HCC)    TIA (transient ischemic attack) 10/14/2014   Overactive bladder 10/14/2014   H/O: CVA (cerebrovascular accident) 10/14/2014   NECK PAIN, CHRONIC 10/29/2010   Essential hypertension 10/10/2009   INSOMNIA 09/15/2009   Pure hypercholesterolemia 11/28/2008   MENOPAUSAL SYNDROME 11/28/2008   Chyrel Masson, PT, DPT, OCS, ATC 05/01/21  3:00 PM    [facility] Physical Therapy [address] [place], [place], [address] Phone: [phone]   Fax:  [phone]  Name: Lexiana Spindel Martinez MRN: [id number] Date of Birth: 12-Feb-1941

## 2021-05-03 ENCOUNTER — Ambulatory Visit (INDEPENDENT_AMBULATORY_CARE_PROVIDER_SITE_OTHER): Payer: Medicare PPO | Admitting: Rehabilitative and Restorative Service Providers"

## 2021-05-03 ENCOUNTER — Encounter: Payer: Self-pay | Admitting: Rehabilitative and Restorative Service Providers"

## 2021-05-03 ENCOUNTER — Other Ambulatory Visit: Payer: Self-pay

## 2021-05-03 DIAGNOSIS — R293 Abnormal posture: Secondary | ICD-10-CM

## 2021-05-03 DIAGNOSIS — M25551 Pain in right hip: Secondary | ICD-10-CM

## 2021-05-03 DIAGNOSIS — M6281 Muscle weakness (generalized): Secondary | ICD-10-CM

## 2021-05-03 DIAGNOSIS — M25562 Pain in left knee: Secondary | ICD-10-CM | POA: Diagnosis not present

## 2021-05-03 DIAGNOSIS — R262 Difficulty in walking, not elsewhere classified: Secondary | ICD-10-CM | POA: Diagnosis not present

## 2021-05-03 DIAGNOSIS — M545 Low back pain, unspecified: Secondary | ICD-10-CM

## 2021-05-03 DIAGNOSIS — G8929 Other chronic pain: Secondary | ICD-10-CM

## 2021-05-03 NOTE — Therapy (Signed)
[facility] Physical Therapy [address] [place], [place], [address] Phone: [phone]   Fax:  [phone]  Physical Therapy Treatment/Progress note  Patient Details  Name: Teresa Martinez MRN: [id number] Date of Birth: January 15, 1941 Referring Provider (PT): Dr. Ninfa Linden   Encounter Date: 05/03/2021  Progress Note Reporting Period 03/30/2021 to 05/03/2021  See note below for Objective Data and Assessment of Progress/Goals.       PT End of Session - 05/03/21 1434     Visit Number 10    Number of Visits 12    Date for PT Re-Evaluation 06/08/21    Authorization Type [employer] 12 visits request until 06/08/2021    Authorization - Visit Number 10    Progress Note Due on Visit 20   recertifcation for humana at 12 if necessary   PT Start Time 8333    PT Stop Time 1508    PT Time Calculation (min) 39 min    Activity Tolerance Patient tolerated treatment well    Behavior During Therapy [facility] for tasks assessed/performed             Past Medical History:  Diagnosis Date   Cervical spine degeneration    C5/C6   Chronic right shoulder pain    Hemiparesis affecting dominant side as late effect of cerebrovascular accident ([place]) 12/07/2014   Hyperlipemia    Hypertension    Menopausal syndrome    Pamelia Hoit)   Overactive bladder    Statin intolerance     Past Surgical History:  Procedure Laterality Date   ABDOMINAL HYSTERECTOMY     1985   BAND HEMORRHOIDECTOMY     2010   CHOLECYSTECTOMY     1950   PAROTIDECTOMY     30 years ago    There were no vitals filed for this visit.   Subjective Assessment - 05/03/21 1432     Subjective Pt. indicated hip was doing better after last visit again.  Lt knee sore, not really painful today.  Global rating of change hip +6 at this time (a great deal better), Lt knee +4 (moderately better).    Currently in Pain? No/denies    Pain Onset More than a month ago    Pain Score 0    Pain Onset More than a  month ago                [facility] PT Assessment - 05/03/21 0001       Assessment   Medical Diagnosis low back pain, Rt hip pain, Lt knee pain    Referring Provider (PT) Dr. Ninfa Linden    Onset Date/Surgical Date 12/19/20    Hand Dominance Right      Observation/Other Assessments   Focus on Therapeutic Outcomes (FOTO)  update 48%      AROM   Lumbar Extension 75% WFL      Strength   Right Hip Flexion 5/5    Left Hip Flexion 5/5    Right Knee Flexion 5/5    Right Knee Extension 5/5    Left Knee Flexion 5/5    Left Knee Extension 5/5      Transfers   Five time sit to stand comments  11.2 seconds      Ambulation/Gait   Gait Comments SPC into clinic, able to ambulate independently on level surfaces in clinic                           [facility] Adult PT  Treatment/Exercise - 05/03/21 0001       Neuro Re-ed    Neuro Re-ed Details  SLS c moderate hand assist corrections 10 sec x 4 bilateral, tandem stance c occasional hand assist corrections 1 min x 2 bilateral      Knee/Hip Exercises: Aerobic   Nustep Lvl 7 10 mins UE/LE      Knee/Hip Exercises: Machines for Strengthening   Cybex Knee Extension eccentric lowering 2x 15 20 lbs, performed bilateral    Cybex Knee Flexion single leg 20 lbs 2 x 15 bilaterally                      PT Short Term Goals - 04/24/21 1501       PT SHORT TERM GOAL #1   Title Patient will demonstrate independent use of home exercise program to maintain progress from in clinic treatments.    Status Achieved               PT Long Term Goals - 05/03/21 1452       PT LONG TERM GOAL #1   Title Patient will demonstrate/report pain at worst less than or equal to 2/10 to facilitate minimal limitation in daily activity secondary to pain symptoms.    Time 10    Period Weeks    Status On-going    Target Date 06/08/21      PT LONG TERM GOAL #2   Title Patient will demonstrate independent use of home exercise program to  facilitate ability to maintain/progress functional gains from skilled physical therapy services.    Time 10    Period Weeks    Status Achieved    Target Date 06/08/21      PT LONG TERM GOAL #3   Title Pt. will demonstrate FOTO outcome > or = 47 to indicate reduced disability due to condition.    Time 10    Period Weeks    Status Achieved    Target Date 06/08/21      PT LONG TERM GOAL #4   Title Pt. will demonstrate lumbar ext > or = 75% WFL s symptoms to facilitate upright standing, walking at PLOF s limitation due to back symptoms.    Time 10    Period Weeks    Status Partially Met    Target Date 06/08/21      PT LONG TERM GOAL #5   Title Pt. will demonstrate Lt knee MMT 5/5 throughout to facilitate transfers, squats, stairs at PLOF.    Time 10    Period Weeks    Status Achieved    Target Date 06/08/21      PT LONG TERM GOAL #6   Title Pt. will demonstrate bialteral SLS > 10 seconds to improve stability in ambulation on even and uneven surface.    Time 10    Period Weeks    Status On-going    Target Date 06/08/21                   Plan - 05/03/21 1449     Clinical Impression Statement Pt. has attended 10 visits overall during course of treatment, reporting good change on GROC for both Rt hip and Lt knee (Rt hip more improved).  See objective data for updated information.  Gains noted in strength and mobility tolerance with deficits still noted in movement coordination balance.  Pt. may continue to benefit from skilled PT services to allow continued improvement towards goals.  Pt.  has 2 more approved visits from [facility] at this time.    Personal Factors and Comorbidities Comorbidity 3+    Comorbidities Lt knee scope 06/01/20  DDD lumbar stenosis, ITB syndrome right side, rt shoulder adhesive capsulitis, CVA rt Hemiparesis, HTN, L3-5 laminectomy 2019,    Examination-Activity Limitations Sit;Sleep;Squat;Bend;Stairs;Stand;Transfers;Locomotion Level     Examination-Participation Restrictions Estate agent;Other;Interpersonal Relationship    Stability/Clinical Decision Making Evolving/Moderate complexity    PT Treatment/Interventions ADLs/Self Care Home Management;Cryotherapy;Electrical Stimulation;Iontophoresis 3m/ml Dexamethasone;Moist Heat;Traction;Balance training;Therapeutic exercise;Therapeutic activities;Functional mobility training;Stair training;Gait training;Ultrasound;DME Instruction;Neuromuscular re-education;Patient/family education;Passive range of motion;Spinal Manipulations;Joint Manipulations;Dry needling;Taping;Manual techniques    PT Next Visit Plan Continue static and dynamic balance improvements, Rt hip joint mobility manually as needed.    PT Home Exercise Plan [email or web address]    Consulted and Agree with Plan of Care Patient             Patient will benefit from skilled therapeutic intervention in order to improve the following deficits and impairments:  Abnormal gait, Decreased endurance, Hypomobility, Pain, Increased edema, Decreased strength, Decreased activity tolerance, Decreased balance, Decreased mobility, Difficulty walking, Improper body mechanics, Impaired perceived functional ability, Impaired flexibility, Postural dysfunction, Decreased coordination, Decreased range of motion  Visit Diagnosis: Acute right-sided low back pain without sciatica  Pain in right hip  Chronic pain of left knee  Muscle weakness (generalized)  Abnormal posture  Difficulty in walking, not elsewhere classified     Problem List Patient Active Problem List   Diagnosis Date Noted   Preop testing 07/09/2018   Degenerative lumbar spinal stenosis 05/12/2018   Statin myopathy 08/29/2016   Iliotibial band syndrome of right side 04/23/2016   Acromioclavicular arthrosis 06/30/2015   Left carpal tunnel syndrome 05/15/2015   Adhesive capsulitis of right shoulder 02/06/2015   Dysarthria due to cerebrovascular  accident 01/09/2015   Hemiparesis affecting dominant side as late effect of [place]vascular accident ([place] 12/07/2014   Spastic neurogenic bladder 10/31/2014   Right rotator cuff tendonitis 10/19/2014   Stenosis of cervical spine region    Chronic right shoulder pain    Cerebral infarction due to thrombosis of left middle cerebral artery (HCC)    Left pontine stroke ([place]    TIA (transient ischemic attack) 10/14/2014   Overactive bladder 10/14/2014   H/O: CVA (cerebrovascular accident) 10/14/2014   NECK PAIN, CHRONIC 10/29/2010   Essential hypertension 10/10/2009   INSOMNIA 09/15/2009   Pure hypercholesterolemia 11/28/2008   MENOPAUSAL SYNDROME 11/28/2008   MScot Jun PT, DPT, OCS, ATC 05/03/21  2:59 PM    [facility] [address] [address]Phone: [phone]  Fax:  [phone] Name: FJacquelene KopeckyMintz-Whitcomb MRN: [id number] of Birth: [birth year]

## 2021-05-08 ENCOUNTER — Other Ambulatory Visit: Payer: Self-pay

## 2021-05-08 ENCOUNTER — Encounter: Payer: Medicare PPO | Admitting: Rehabilitative and Restorative Service Providers"

## 2021-05-08 ENCOUNTER — Ambulatory Visit (INDEPENDENT_AMBULATORY_CARE_PROVIDER_SITE_OTHER): Payer: Medicare PPO | Admitting: Rehabilitative and Restorative Service Providers"

## 2021-05-08 ENCOUNTER — Encounter: Payer: Self-pay | Admitting: Rehabilitative and Restorative Service Providers"

## 2021-05-08 DIAGNOSIS — M25551 Pain in right hip: Secondary | ICD-10-CM

## 2021-05-08 DIAGNOSIS — M6281 Muscle weakness (generalized): Secondary | ICD-10-CM | POA: Diagnosis not present

## 2021-05-08 DIAGNOSIS — M545 Low back pain, unspecified: Secondary | ICD-10-CM | POA: Diagnosis not present

## 2021-05-08 DIAGNOSIS — R293 Abnormal posture: Secondary | ICD-10-CM | POA: Diagnosis not present

## 2021-05-08 DIAGNOSIS — G8929 Other chronic pain: Secondary | ICD-10-CM | POA: Diagnosis not present

## 2021-05-08 DIAGNOSIS — R262 Difficulty in walking, not elsewhere classified: Secondary | ICD-10-CM | POA: Diagnosis not present

## 2021-05-08 DIAGNOSIS — M25562 Pain in left knee: Secondary | ICD-10-CM | POA: Diagnosis not present

## 2021-05-08 NOTE — Therapy (Signed)
[facility] [address] [place], [place], [address] Phone: [phone]   Fax:  [phone]  Physical Therapy Treatment  Patient Details  Name: Teresa Martinez MRN: [id number] Date of Birth: 1941/07/14 Referring Provider (PT): Dr. Ninfa Linden   Encounter Date: 05/08/2021   PT End of Session - 05/08/21 1435     Visit Number 11    Number of Visits 12    Date for PT Re-Evaluation 06/08/21    Authorization Type [employer] 12 visits request until 06/08/2021    Authorization - Visit Number 10    Progress Note Due on Visit 20   recertifcation for humana at 12 if necessary   PT Start Time 1427    PT Stop Time 1506    PT Time Calculation (min) 39 min    Activity Tolerance Patient tolerated treatment well    Behavior During Therapy [facility] for tasks assessed/performed             Past Medical History:  Diagnosis Date   Cervical spine degeneration    C5/C6   Chronic right shoulder pain    Hemiparesis affecting dominant side as late effect of cerebrovascular accident ([place]) 12/07/2014   Hyperlipemia    Hypertension    Menopausal syndrome    Pamelia Hoit)   Overactive bladder    Statin intolerance     Past Surgical History:  Procedure Laterality Date   ABDOMINAL HYSTERECTOMY     1985   BAND HEMORRHOIDECTOMY     2010   CHOLECYSTECTOMY     1950   PAROTIDECTOMY     30 years ago    There were no vitals filed for this visit.   Subjective Assessment - 05/08/21 1434     Subjective Hip still gets stiff but stretching helps it, not hurting as much as before.    Currently in Pain? No/denies    Pain Score 0-No pain    Pain Onset More than a month ago    Aggravating Factors  sitting prolonged, lying down sometimes    Pain Relieving Factors stretching    Pain Score 0   no pain at rest   Pain Onset More than a month ago                               [facility] Adult PT Treatment/Exercise - 05/08/21 0001        Knee/Hip Exercises: Stretches   Other Knee/Hip Stretches figure 4 stretch supine Rt leg 15 sec x 5      Knee/Hip Exercises: Aerobic   Nustep Lvl 7 10 mins UE/LE      Knee/Hip Exercises: Machines for Strengthening   Cybex Knee Extension eccentric lowering 2x 15 20 lbs, performed bilateral    Total Gym Leg Press Double leg 112 lbs 3 x 15      Knee/Hip Exercises: Standing   Lateral Step Up Hand Hold: 1;Step Height: 6";20 reps;Both      Manual Therapy   Manual therapy comments Rt hip inferior g3 mobs                      PT Short Term Goals - 04/24/21 1501       PT SHORT TERM GOAL #1   Title Patient will demonstrate independent use of home exercise program to maintain progress from in clinic treatments.    Status Achieved  PT Long Term Goals - 05/03/21 1452       PT LONG TERM GOAL #1   Title Patient will demonstrate/report pain at worst less than or equal to 2/10 to facilitate minimal limitation in daily activity secondary to pain symptoms.    Time 10    Period Weeks    Status On-going    Target Date 06/08/21      PT LONG TERM GOAL #2   Title Patient will demonstrate independent use of home exercise program to facilitate ability to maintain/progress functional gains from skilled physical therapy services.    Time 10    Period Weeks    Status Achieved    Target Date 06/08/21      PT LONG TERM GOAL #3   Title Pt. will demonstrate FOTO outcome > or = 47 to indicate reduced disability due to condition.    Time 10    Period Weeks    Status Achieved    Target Date 06/08/21      PT LONG TERM GOAL #4   Title Pt. will demonstrate lumbar ext > or = 75% WFL s symptoms to facilitate upright standing, walking at PLOF s limitation due to back symptoms.    Time 10    Period Weeks    Status Partially Met    Target Date 06/08/21      PT LONG TERM GOAL #5   Title Pt. will demonstrate Lt knee MMT 5/5 throughout to facilitate transfers, squats, stairs  at PLOF.    Time 10    Period Weeks    Status Achieved    Target Date 06/08/21      PT LONG TERM GOAL #6   Title Pt. will demonstrate bialteral SLS > 10 seconds to improve stability in ambulation on even and uneven surface.    Time 10    Period Weeks    Status On-going    Target Date 06/08/21                   Plan - 05/08/21 1455     Clinical Impression Statement Continued strength training program with goals of transitioning towards HEP.  Rt hip joint mobility continued to show improvement overall and benefits of stretching noted.    Personal Factors and Comorbidities Comorbidity 3+    Comorbidities Lt knee scope 06/01/20  DDD lumbar stenosis, ITB syndrome right side, rt shoulder adhesive capsulitis, CVA rt Hemiparesis, HTN, L3-5 laminectomy 2019,    Examination-Activity Limitations Sit;Sleep;Squat;Bend;Stairs;Stand;Transfers;Locomotion Level    Examination-Participation Restrictions Estate agent;Other;Interpersonal Relationship    Stability/Clinical Decision Making Evolving/Moderate complexity    PT Treatment/Interventions ADLs/Self Care Home Management;Cryotherapy;Electrical Stimulation;Iontophoresis 74m/ml Dexamethasone;Moist Heat;Traction;Balance training;Therapeutic exercise;Therapeutic activities;Functional mobility training;Stair training;Gait training;Ultrasound;DME Instruction;Neuromuscular re-education;Patient/family education;Passive range of motion;Spinal Manipulations;Joint Manipulations;Dry needling;Taping;Manual techniques    PT Next Visit Plan Probable d/c pending change in presentation.    PT Home Exercise Plan [email or web address]    Consulted and Agree with Plan of Care Patient             Patient will benefit from skilled therapeutic intervention in order to improve the following deficits and impairments:  Abnormal gait, Decreased endurance, Hypomobility, Pain, Increased edema, Decreased strength, Decreased activity tolerance, Decreased  balance, Decreased mobility, Difficulty walking, Improper body mechanics, Impaired perceived functional ability, Impaired flexibility, Postural dysfunction, Decreased coordination, Decreased range of motion  Visit Diagnosis: Acute right-sided low back pain without sciatica  Pain in right hip  Chronic pain of left knee  Muscle weakness (generalized)  Abnormal  posture  Difficulty in walking, not elsewhere classified     Problem List Patient Active Problem List   Diagnosis Date Noted   Preop testing 07/09/2018   Degenerative lumbar spinal stenosis 05/12/2018   Statin myopathy 08/29/2016   Iliotibial band syndrome of right side 04/23/2016   Acromioclavicular arthrosis 06/30/2015   Left carpal tunnel syndrome 05/15/2015   Adhesive capsulitis of right shoulder 02/06/2015   Dysarthria due to cerebrovascular accident 01/09/2015   Hemiparesis affecting dominant side as late effect of cerebrovascular accident ([place]) 12/07/2014   Spastic neurogenic bladder 10/31/2014   Right rotator cuff tendonitis 10/19/2014   Stenosis of cervical spine region    Chronic right shoulder pain    Cerebral infarction due to thrombosis of left middle cerebral artery ([place])    Left pontine stroke ([place])    TIA (transient ischemic attack) 10/14/2014   Overactive bladder 10/14/2014   H/O: CVA (cerebrovascular accident) 10/14/2014   NECK PAIN, CHRONIC 10/29/2010   Essential hypertension 10/10/2009   INSOMNIA 09/15/2009   Pure hypercholesterolemia 11/28/2008   MENOPAUSAL SYNDROME 11/28/2008   Scot Jun, PT, DPT, OCS, ATC 05/08/21  2:59 PM    [facility] [address] [place], [place], [address] Phone: [phone]   Fax:  [phone]  Name: Teresa Martinez MRN: [id number] Date of Birth: [birth year]

## 2021-05-10 ENCOUNTER — Encounter: Payer: Medicare PPO | Admitting: Rehabilitative and Restorative Service Providers"

## 2021-05-22 ENCOUNTER — Ambulatory Visit (HOSPITAL_BASED_OUTPATIENT_CLINIC_OR_DEPARTMENT_OTHER): Payer: Medicare PPO | Admitting: Internal Medicine

## 2021-05-22 ENCOUNTER — Other Ambulatory Visit: Payer: Self-pay

## 2021-05-22 VITALS — BP 162/94 | HR 87 | Ht 66.0 in | Wt 134.8 lb

## 2021-05-22 DIAGNOSIS — M791 Myalgia, unspecified site: Secondary | ICD-10-CM | POA: Diagnosis not present

## 2021-05-22 DIAGNOSIS — Z8673 Personal history of transient ischemic attack (TIA), and cerebral infarction without residual deficits: Secondary | ICD-10-CM | POA: Diagnosis not present

## 2021-05-22 DIAGNOSIS — E119 Type 2 diabetes mellitus without complications: Secondary | ICD-10-CM

## 2021-05-22 DIAGNOSIS — E7849 Other hyperlipidemia: Secondary | ICD-10-CM | POA: Diagnosis not present

## 2021-05-22 DIAGNOSIS — T466X5A Adverse effect of antihyperlipidemic and antiarteriosclerotic drugs, initial encounter: Secondary | ICD-10-CM

## 2021-05-22 MED ORDER — REPATHA SURECLICK 140 MG/ML ~~LOC~~ SOAJ: 1.0000 | SUBCUTANEOUS | 11 refills | Status: DC

## 2021-05-22 NOTE — Progress Notes (Signed)
LIPID CLINIC CONSULT NOTE  Chief Complaint:  Manage dyslipidemia  Primary Care Physician: Philip Aspen, Limmie Patricia, MD  Primary Cardiologist:  None  HPI:  Teresa Martinez is a 80 y.o. female who is being seen today for the evaluation of dyslipidemia at the request of Philip Aspen, Almira Bar*.  This is a pleasant 80 year old female kindly referred for evaluation management of dyslipidemia.  Interestingly she has been very athletic most of her life in fact competing in marathon races even into her 14s.  Unfortunately she had a stroke in 2016 and since then had some mediate issues with hemiparesis but says she has never been quite the same.  She also has a history of hypertension, dyslipidemia, mild bilateral carotid disease and some cerebrovascular atherosclerosis based on CT imaging.  She has been intolerant to statins and reports a strong family history of high cholesterol in both of her twin daughters, her aunt, her brother and other family members.  Initial blood pressure was 162/94 however she said it is typically running around 130/78.  Lipids were reassessed in March showing total cholesterol 244, HDL 55, HDL 172 and triglycerides 86.  She has been on ezetimibe but she says it does not seem to be effective for her.  Previously she had taken statins which caused her severe side effects.  She had tried 3 different statins with her prior primary care provider to cause significant inability to walk and she said she would not ever take them again.  PMHx:  Past Medical History:  Diagnosis Date   Cervical spine degeneration    C5/C6   Chronic right shoulder pain    Hemiparesis affecting dominant side as late effect of cerebrovascular accident ([facility]) 12/07/2014   Hyperlipemia    Hypertension    Menopausal syndrome    Floyde Parkins)   Overactive bladder    Statin intolerance     Past Surgical History:  Procedure Laterality Date   ABDOMINAL HYSTERECTOMY     1985   BAND  HEMORRHOIDECTOMY     2010   CHOLECYSTECTOMY     1950   PAROTIDECTOMY     30 years ago    FAMHx:  Family History  Problem Relation Age of Onset   Heart failure Father    Diabetes Mother    Hypertension Mother    Stroke Mother    Cirrhosis Sister    Diabetes Sister    Hyperlipidemia Brother    Hypertension Brother    Stroke Brother    Diabetes Brother     SOCHx:   reports that she has never smoked. She has never used smokeless tobacco. She reports that she does not drink alcohol and does not use drugs.  ALLERGIES:  Allergies  Allergen Reactions   Statins Other (See Comments)    Muscle pain and unable to walk Muscle pain and unable to walk   Latex Other (See Comments)    Burns her skin    ROS: Pertinent items noted in HPI and remainder of comprehensive ROS otherwise negative.  HOME MEDS: Current Outpatient Medications on File Prior to Visit  Medication Sig Dispense Refill   aspirin EC 325 MG EC tablet Take 1 tablet (325 mg total) by mouth daily. 100 tablet 1   conjugated estrogens (PREMARIN) vaginal cream Place 0.5 g vaginally.     ezetimibe (ZETIA) 10 MG tablet TAKE 1 TABLET(10 MG) BY MOUTH DAILY 90 tablet 1   fesoterodine (TOVIAZ) 4 MG TB24 tablet TAKE 1 TABLET(4 MG) BY  MOUTH DAILY 90 tablet 1   HYDROcodone-acetaminophen (NORCO/VICODIN) 5-325 MG tablet Take 1-2 tablets by mouth every 6 (six) hours as needed for moderate pain. 30 tablet 0   lisinopril (ZESTRIL) 10 MG tablet TAKE 1 TABLET(10 MG) BY MOUTH DAILY 90 tablet 1   No current facility-administered medications on file prior to visit.    LABS/IMAGING: No results found for this or any previous visit (from the past 48 hour(s)). No results found.  LIPID PANEL:    Component Value Date/Time   CHOL 244 (H) 01/26/2021 1031   TRIG 86.0 01/26/2021 1031   HDL 54.90 01/26/2021 1031   CHOLHDL 4 01/26/2021 1031   VLDL 17.2 01/26/2021 1031   LDLCALC 172 (H) 01/26/2021 1031   LDLCALC 142 (H) 07/26/2020 1009    LDLDIRECT 205.1 07/30/2013 0946    WEIGHTS: Wt Readings from Last 3 Encounters:  05/22/21 134 lb 12.8 oz (61.1 kg)  01/26/21 134 lb 9.6 oz (61.1 kg)  12/15/20 137 lb 3.2 oz (62.2 kg)    VITALS: BP (!) 162/94   Pulse 87   Ht 5\' 6"  (1.676 m)   Wt 134 lb 12.8 oz (61.1 kg)   SpO2 98%   BMI 21.76 kg/m   EXAM: General appearance: alert, no distress, and thin Neck: no carotid bruit, no JVD, and thyroid not enlarged, symmetric, no tenderness/mass/nodules Lungs: clear to auscultation bilaterally Heart: regular rate and rhythm, S1, S2 normal, no murmur, click, rub or gallop Abdomen: soft, non-tender; bowel sounds normal; no masses,  no organomegaly Extremities: extremities normal, atraumatic, no cyanosis or edema Pulses: 2+ and symmetric Skin: Skin color, texture, turgor normal. No rashes or lesions Neurologic: Grossly normal Psych: Pleasant  EKG: Deferred  ASSESSMENT: Probable familial hyperlipidemia History of stroke (2016) Mild bilateral carotid disease-ultrasound in 2015 Hypertension Family history of high cholesterol and multiple family members and twin daughters  PLAN: 1.   Ms. Martinez has probable familial hyperlipidemia with a treated LDL cholesterol of 172 on ezetimibe therefore her LDL is likely 20 to 25% higher than that untreated.  There is also very strong family history of high cholesterol including her twin daughters which have very similar high numbers.  This is strongly suggestive of FH which is autosomal dominant.  There is also history of heart disease and she has had personal stroke.  She has bilateral carotid artery disease and unfortunately cannot tolerate statins.  She is already on ezetimibe but remains well above her target LDL of less than 70.  I recommend a PCSK9 inhibitor for her and will pursue Repatha 140 mg every 2 weeks.  Plan repeat lipids in about 3 to 4 months and follow-up with me at that time.  Thanks again for the kind  referral.  2016, MD, [facility]  [place]  [facility]  Medical Director of the Advanced Lipid Disorders &  Cardiovascular Risk Reduction Clinic Diplomate of the American Board of Clinical Lipidology Attending Cardiologist  Direct Dial: [phone]  Fax: [phone]  Website:  [email or web address] 05/22/2021, 3:34 PM

## 2021-05-22 NOTE — Patient Instructions (Signed)
Medication Instructions:  Dr. Rennis Golden recommends Repatha 140mg /mL (PCSK9). This is an injectable cholesterol medication self-administered once every 14 days. This medication will likely need prior approval with your insurance company, which we will work on. If the medication is not approved initially, we may need to do an appeal with your insurance.   Administer medication in area of fatty tissue such as abdomen, outer thigh, back of upper arm - and rotate site with each injection Store medication in refrigerator until ready to administer - allow to sit at room temp for 30 mins - 1 hour prior to injection Dispose of medication in a SHARPS container - your pharmacy should be able to direct you on this and proper disposal   If you need a co-pay card for Repatha: >> paying for Repatha or red box that says "Repatha Copay Card" in top right If you need a co-pay card for Praluent: [email or web address].br >> starting & paying for Praluent  Patient Assistance:   The PAN Foundation: https://www.panfoundation.org/disease-funds/hypercholesterolemia/ -- can sign up for wait list  The [employer] Well foundation offers assistance to help pay for medication copays.  They will cover copays for all cholesterol lowering meds, including statins, fibrates, omega-3 fish oils like Vascepa, ezetimibe, Repatha, Praluent, Nexletol, Nexlizet.  The cards are usually good for $2,500 or 12 months, whichever comes first. Go to healthwellfoundation.org Click on "Apply Now" Answer questions as to whom is applying (patient or representative) Your disease fund will be "hypercholesterolemia - Medicare access" They will ask questions about finances and which medications you are taking for cholesterol When you submit, the approval is usually within minutes.  You will need to print the card information from the site You will need to show this information to your pharmacy, they will bill your Medicare Part D plan first -then bill Health  Well --for the copay.   You can also call them at [phone], although the hold times can be quite long.     *If you need a refill on your cardiac medications before your next appointment, please call your pharmacy*   Lab Work: FASTING lab work in 3-4 months to check cholesterol  -- please complete about 1 week before your next visit -- complete at any [facility] location   If you have labs (blood work) drawn today and your tests are completely normal, you will receive your results only by: MyChart Message (if you have MyChart) OR A paper copy in the mail If you have any lab test that is abnormal or we need to change your treatment, we will call you to review the results.   Follow-Up: At [facility], you and your health needs are our priority.  As part of our continuing mission to provide you with exceptional heart care, we have created designated Provider Care Teams.  These Care Teams include your primary Cardiologist (physician) and Advanced Practice Providers (APPs -  Physician Assistants and Nurse Practitioners) who all work together to provide you with the care you need, when you need it.  We recommend signing up for the patient portal called "MyChart".  Sign up information is provided on this After Visit Summary.  MyChart is used to connect with patients for Virtual Visits (Telemedicine).  Patients are able to view lab/test results, encounter notes, upcoming appointments, etc.  Non-urgent messages can be sent to your provider as well.   To learn more about what you can do with MyChart, go to [facility].    Your next appointment:   3-4  month(s) - lipid clinic  The format for your next appointment:   In Person  Provider:   K. Mali Hilty, MD   Other Instructions

## 2021-05-23 ENCOUNTER — Telehealth: Payer: Self-pay | Admitting: Internal Medicine

## 2021-05-23 ENCOUNTER — Encounter: Payer: Medicare PPO | Admitting: Rehabilitative and Restorative Service Providers"

## 2021-05-23 NOTE — Telephone Encounter (Signed)
PA Case: [id number], Status: Approved, Coverage Starts on: 05/23/2021 12:00:00 AM, Coverage Ends on: 11/19/2021 12:00:00 AM.

## 2021-05-23 NOTE — Telephone Encounter (Signed)
PA for repatha sureclick submitted via CMM (Key: [id number])

## 2021-05-25 ENCOUNTER — Other Ambulatory Visit: Payer: Self-pay | Admitting: Internal Medicine

## 2021-05-25 DIAGNOSIS — I10 Essential (primary) hypertension: Secondary | ICD-10-CM

## 2021-05-25 DIAGNOSIS — E7800 Pure hypercholesterolemia, unspecified: Secondary | ICD-10-CM

## 2021-05-28 ENCOUNTER — Encounter: Payer: Medicare PPO | Admitting: Rehabilitative and Restorative Service Providers"

## 2021-05-28 NOTE — Telephone Encounter (Signed)
Left message that med is approved & she should call back if she has questions/concerns.

## 2021-06-05 ENCOUNTER — Other Ambulatory Visit: Payer: Self-pay

## 2021-06-05 ENCOUNTER — Encounter: Payer: Self-pay | Admitting: Rehabilitative and Restorative Service Providers"

## 2021-06-05 ENCOUNTER — Ambulatory Visit (INDEPENDENT_AMBULATORY_CARE_PROVIDER_SITE_OTHER): Payer: Medicare PPO | Admitting: Rehabilitative and Restorative Service Providers"

## 2021-06-05 DIAGNOSIS — R293 Abnormal posture: Secondary | ICD-10-CM

## 2021-06-05 DIAGNOSIS — G8929 Other chronic pain: Secondary | ICD-10-CM | POA: Diagnosis not present

## 2021-06-05 DIAGNOSIS — M25562 Pain in left knee: Secondary | ICD-10-CM

## 2021-06-05 DIAGNOSIS — M25551 Pain in right hip: Secondary | ICD-10-CM

## 2021-06-05 DIAGNOSIS — M6281 Muscle weakness (generalized): Secondary | ICD-10-CM | POA: Diagnosis not present

## 2021-06-05 DIAGNOSIS — M545 Low back pain, unspecified: Secondary | ICD-10-CM

## 2021-06-05 DIAGNOSIS — R262 Difficulty in walking, not elsewhere classified: Secondary | ICD-10-CM

## 2021-06-05 NOTE — Therapy (Addendum)
[facility] [address] [place], [place], [address] Phone: [phone]   Fax:  [phone]  Physical Therapy Treatment/Re-Eval/Recertification - Discharge  Patient Details  Name: Teresa Martinez MRN: [id number] Date of Birth: 01-04-1941 Referring Provider (PT): Dr. Ninfa Linden   Encounter Date: 06/05/2021   PT End of Session - 06/05/21 1500     Visit Number 12    Number of Visits 17    Date for PT Re-Evaluation 07/17/21    Authorization Type [employer] request for 6 visits until 07/17/2021    Authorization - Visit Number 1    Authorization - Number of Visits 6    Progress Note Due on Visit 17   recertifcation for [employer] at 12 if necessary   PT Start Time 1430    PT Stop Time 1508    PT Time Calculation (min) 38 min    Activity Tolerance Patient tolerated treatment well    Behavior During Therapy [facility] for tasks assessed/performed           Progress Note Reporting Period 03/30/2021 to 06/05/2021  See note below for Objective Data and Assessment of Progress/Goals.      Past Medical History:  Diagnosis Date   Cervical spine degeneration    C5/C6   Chronic right shoulder pain    Hemiparesis affecting dominant side as late effect of cerebrovascular accident ([place]) 12/07/2014   Hyperlipemia    Hypertension    Menopausal syndrome    Pamelia Hoit)   Overactive bladder    Statin intolerance     Past Surgical History:  Procedure Laterality Date   ABDOMINAL HYSTERECTOMY     1985   BAND HEMORRHOIDECTOMY     2010   CHOLECYSTECTOMY     1950   PAROTIDECTOMY     30 years ago    There were no vitals filed for this visit.   Subjective Assessment - 06/05/21 1436     Subjective Pt. stated pain was 5-6/10 in Rt hip.  Pt. indicated a fall onto Rt hip in last several weeks that led to increased pain but progressively getting some better.  Pt. indicated feeling better with use of exercise.    Currently in Pain? No/denies    Pain  Score --   pain at 5-6/10 at worst   Pain Location Back    Pain Orientation Right    Pain Descriptors / Indicators Aching;Sore    Pain Onset More than a month ago    Aggravating Factors  pressure on lateral hip, pain after fall    Pain Relieving Factors exercises, rubbing c cream    Pain Onset More than a month ago                [facility] PT Assessment - 06/05/21 0001       Assessment   Medical Diagnosis low back pain, Rt hip pain, Lt knee pain    Referring Provider (PT) Dr. Ninfa Linden    Onset Date/Surgical Date 12/19/20    Hand Dominance Right      Balance Screen   Has the patient fallen in the past 6 months Yes    How many times? 1    Has the patient had a decrease in activity level because of a fear of falling?  No    Is the patient reluctant to leave their home because of a fear of falling?  No      AROM   Lumbar Extension 100 % [facility]      Strength  Right Hip Flexion 5/5    Right Hip ABduction 4/5    Left Hip Flexion 5/5    Right Knee Flexion 5/5    Right Knee Extension 5/5    Left Knee Flexion 5/5    Left Knee Extension 5/5      Palpation   Palpation comment Rt glute med tenderness/trigger point noted      Ambulation/Gait   Gait Comments SPC used but able to independently ambulation in clinic                           [facility] Adult PT Treatment/Exercise - 06/05/21 0001       Knee/Hip Exercises: Aerobic   Nustep Lvl 7 10 mins UE/LE      Knee/Hip Exercises: Machines for Strengthening   Cybex Knee Extension eccentric lowering 2x 15 20 lbs, performed bilateral                      PT Short Term Goals - 04/24/21 1501       PT SHORT TERM GOAL #1   Title Patient will demonstrate independent use of home exercise program to maintain progress from in clinic treatments.    Status Achieved               PT Long Term Goals - 06/05/21 1446       PT LONG TERM GOAL #1   Title Patient will demonstrate/report pain at worst less  than or equal to 2/10 to facilitate minimal limitation in daily activity secondary to pain symptoms.    Time 6    Period Weeks    Status Revised    Target Date 07/17/21      PT LONG TERM GOAL #2   Title Patient will demonstrate independent use of home exercise program to facilitate ability to maintain/progress functional gains from skilled physical therapy services.    Time 10    Period Weeks    Status Achieved      PT LONG TERM GOAL #3   Title Pt. will demonstrate FOTO outcome > or = 47 to indicate reduced disability due to condition.    Time 10    Period Weeks    Status Achieved      PT LONG TERM GOAL #4   Title Pt. will demonstrate lumbar ext > or = 75% WFL s symptoms to facilitate upright standing, walking at PLOF s limitation due to back symptoms.    Time 10    Period Weeks    Status Achieved      PT LONG TERM GOAL #5   Title Pt. will demonstrate Lt knee MMT 5/5 throughout to facilitate transfers, squats, stairs at PLOF.    Time 10    Period Weeks    Status Achieved      PT LONG TERM GOAL #6   Title Pt. will demonstrate bialteral SLS > 10 seconds to improve stability in ambulation on even and uneven surface.    Time 6    Period Weeks    Status Revised    Target Date 07/17/21                   Plan - 06/05/21 1447     Clinical Impression Statement Pt. has attended 12 visits overall during course of treatment.  Pt. has reported improvement in symptoms overall but recently indicated fall onto Rt hip that resulted in increase of symptoms that as  progressively reduced but still noted.  See objective data for updated information.  Functional limitations including morning transfers/movement, ambulation at times. Pt. is appropriate and recommended for additional approval from [facility] for visits to help address new complaints of Rt hip pain and help to improve function and progress towards HEP transitioning when appopriate.    Personal Factors and Comorbidities  Comorbidity 3+    Comorbidities Lt knee scope 06/01/20  DDD lumbar stenosis, ITB syndrome right side, rt shoulder adhesive capsulitis, CVA rt Hemiparesis, HTN, L3-5 laminectomy 2019,    Examination-Activity Limitations Sit;Sleep;Squat;Bend;Stairs;Stand;Transfers;Locomotion Level    Examination-Participation Restrictions Estate agent;Other;Interpersonal Relationship    Stability/Clinical Decision Making Evolving/Moderate complexity    Rehab Potential Good    PT Frequency 1x / week    PT Duration 6 weeks    PT Treatment/Interventions ADLs/Self Care Home Management;Cryotherapy;Electrical Stimulation;Iontophoresis 109m/ml Dexamethasone;Moist Heat;Traction;Balance training;Therapeutic exercise;Therapeutic activities;Functional mobility training;Stair training;Gait training;Ultrasound;DME Instruction;Neuromuscular re-education;Patient/family education;Passive range of motion;Spinal Manipulations;Joint Manipulations;Dry needling;Taping;Manual techniques    PT Next Visit Plan Manual intervention for Rt hip joint mobility and trigger point release.    PT Home Exercise Plan [email or web address]    Consulted and Agree with Plan of Care Patient             Patient will benefit from skilled therapeutic intervention in order to improve the following deficits and impairments:  Abnormal gait, Decreased endurance, Hypomobility, Pain, Increased edema, Decreased strength, Decreased activity tolerance, Decreased balance, Decreased mobility, Difficulty walking, Improper body mechanics, Impaired perceived functional ability, Impaired flexibility, Postural dysfunction, Decreased coordination, Decreased range of motion  Visit Diagnosis: Acute right-sided low back pain without sciatica  Pain in right hip  Chronic pain of left knee  Muscle weakness (generalized)  Abnormal posture  Difficulty in walking, not elsewhere classified     Problem List Patient Active Problem List   Diagnosis Date Noted    Preop testing 07/09/2018   Degenerative lumbar spinal stenosis 05/12/2018   Statin myopathy 08/29/2016   Iliotibial band syndrome of right side 04/23/2016   Acromioclavicular arthrosis 06/30/2015   Left carpal tunnel syndrome 05/15/2015   Adhesive capsulitis of right shoulder 02/06/2015   Dysarthria due to cerebrovascular accident 01/09/2015   Hemiparesis affecting dominant side as late effect of [place] 12/07[place]   Spastic neurogenic bladder 10/31/2014   Right rotator cuff tendonitis 10/19/2014   Stenosis of cervical spine region    Chronic right shoulder pain    Cerebral infarction due to thrombosis of left middle cerebral artery ([place]    [place]ine stroke [place]    TIA (transient ischemic attack) 10/14/2014   Overactive bladder 10/14/2014   H/O: CVA (cerebrovascular accident) 10/14/2014   NECK PAIN, CHRONIC 10/29/2010   Essential hypertension 10/10/2009   INSOMNIA 09/15/2009   Pure hypercholesterolemia 11/28/2008   MENOPAUSAL SYNDROME 11/28/2008    Referring diagnosis? M54.50 Treatment diagnosis? (if different than referring diagnosis) M25.551 What was this (referring dx) caused by? '[]'  Surgery '[x]'  Fall '[x]'  Ongoing issue '[]'  Arthritis '[]'  Other: ____________  Laterality: '[]'  Rt '[]'  Lt '[x]'  Both  Check all possible CPT codes:      '[x]'  97110 (Therapeutic Exercise)  '[]'  92507 (SLP Treatment)  '[x]'  97112 (Neuro Re-ed)   '[]'  92526 (Swallowing Treatment)   '[x]'  97116 (Gait Training)   '[]'  [id number](Cognitive Training, 1st 15 minutes) '[x]'  97140 (Manual Therapy)   '[]'  97130 (Cognitive Training, each add'l 15 minutes)  '[x]'  97530 (Therapeutic Activities)  '[]'  Other, List CPT Code ____________    '[x]'  928786(Self Care)       [  x] All codes above (97110 - 97535)  '[]'  97012 (Mechanical Traction)  '[x]'  97014 (E-stim Unattended)  '[]'  97032 (E-stim manual)  '[x]'  07072 (Ionto)  '[x]'  97035 (Ultrasound)  '[]'  97760 (Orthotic Fit) '[x]'  17116 (Physical Performance Training) '[]'   [id number] (Aquatic Therapy) '[]'  97034 (Contrast Bath) '[]'  [id number] (Paraffin) '[]'  97597 (Wound Care 1st 20 sq cm) '[]'  97598 (Wound Care each add'l 20 sq cm) '[]'  97016 (Vasopneumatic Device) '[]'  [phone] Comptroller) '[]'  43275 (Prosthetic Training)  Scot Jun, PT, DPT, OCS, ATC 06/05/21  3:04 PM  PHYSICAL THERAPY DISCHARGE SUMMARY  Visits from Start of Care: 12  Current functional level related to goals / functional outcomes: See note   Remaining deficits: See note   Education / Equipment: HEP   Patient agrees to discharge. Patient goals were met. Patient is being discharged due to not returning since the last visit.  Scot Jun, PT, DPT, OCS, ATC 07/17/21  11:21 AM    [facility] Physical Therapy [address] [place], [place], [address] Phone: [phone]   Fax:  [phone]  Name: Teresa Martinez MRN: [id number] Date of Birth: [birth year]

## 2021-06-14 ENCOUNTER — Other Ambulatory Visit: Payer: Self-pay

## 2021-06-14 ENCOUNTER — Telehealth: Payer: Self-pay

## 2021-06-14 ENCOUNTER — Ambulatory Visit: Payer: Medicare PPO | Admitting: Orthopaedic Surgery

## 2021-06-14 ENCOUNTER — Encounter: Payer: Self-pay | Admitting: Orthopaedic Surgery

## 2021-06-14 DIAGNOSIS — G8929 Other chronic pain: Secondary | ICD-10-CM | POA: Diagnosis not present

## 2021-06-14 DIAGNOSIS — M25551 Pain in right hip: Secondary | ICD-10-CM

## 2021-06-14 DIAGNOSIS — M1712 Unilateral primary osteoarthritis, left knee: Secondary | ICD-10-CM

## 2021-06-14 DIAGNOSIS — M25562 Pain in left knee: Secondary | ICD-10-CM

## 2021-06-14 MED ORDER — METHYLPREDNISOLONE ACETATE 40 MG/ML IJ SUSP
40.0000 mg | INTRAMUSCULAR | Status: AC | PRN
Start: 2021-06-14 — End: 2021-06-14
  Administered 2021-06-14: 40 mg via INTRA_ARTICULAR

## 2021-06-14 MED ORDER — LIDOCAINE HCL 1 % IJ SOLN
3.0000 mL | INTRAMUSCULAR | Status: AC | PRN
Start: 2021-06-14 — End: 2021-06-14
  Administered 2021-06-14: 3 mL

## 2021-06-14 NOTE — Telephone Encounter (Signed)
Left knee gel injection ?

## 2021-06-14 NOTE — Progress Notes (Signed)
Office Visit Note   Patient: Teresa Martinez           Date of Birth: 22-Nov-1940           MRN: [id number] Visit Date: 06/14/2021              Requested by: Philip Aspen, Limmie Patricia, MD [address] [place],  [place] [address] PCP: Philip Aspen, Limmie Patricia, MD   Assessment & Plan: Visit Diagnoses:  1. Chronic pain of left knee   2. Unilateral primary osteoarthritis, left knee     Plan: I did aspirate 30 to 40 cc of clear yellow fluid from the left knee and then place a steroid injection in her left knee to temporize the pain she is having.  We will see hopefully that we can get hyaluronic acid approved for her left knee to treat the pain from osteoarthritis since that is what is helped her in the past and she has had this before 6 months ago.  We will also see about Dr. Alvester Morin providing an intra-articular steroid injection into her right hip joint under fluoroscopy.  We will see her in follow-up for her hyaluronic acid injection.  Follow-Up Instructions: We will schedule return visits after she has had a steroid injection in her right hip by Dr. Alvester Morin under fluoroscopy and is approved for a left knee hyaluronic acid injection.  Orders:  Orders Placed This Encounter  Procedures   Large Joint Inj   No orders of the defined types were placed in this encounter.     Procedures: Large Joint Inj: L knee on 06/14/2021 2:52 PM Indications: diagnostic evaluation and pain Details: 22 G 1.5 in needle, superolateral approach  Arthrogram: No  Medications: 3 mL lidocaine 1 %; 40 mg methylPREDNISolone acetate 40 MG/ML Outcome: tolerated well, no immediate complications Procedure, treatment alternatives, risks and benefits explained, specific risks discussed. Consent was given by the patient. Immediately prior to procedure a time out was called to verify the correct patient, procedure, equipment, support staff and site/side marked as required. Patient was prepped and draped  in the usual sterile fashion.      Clinical Data: No additional findings.   Subjective: Chief Complaint  Patient presents with   Left Knee - Follow-up  The patient is well-known to me.  She is 80 years old and has known significant arthritis of her left knee and her right hip.  She also has L4 and L5 spondylolisthesis that causes back pain.  She has been through physical therapy to help with the mobility.  Hyaluronic acid was placed in her knee back in February.  She would like to have that done again for her left knee osteoarthritis.  That is helped better than steroid injections.  However she would like to have her left knee aspirated today and a steroid injection while she awaits approval for a hyaluronic acid injection for her left knee.  She is also been dealing with right hip pain.  She is never had a steroid injection in the right hip joint and she says at this point she is interested in that.  She does walk with a cane.  She has had no acute change in her medical status and is otherwise healthy.  HPI  Review of Systems There is no listed headache, chest pain, shortness of breath, fever, chills, nausea, vomiting  Objective: Vital Signs: There were no vitals taken for this visit.  Physical Exam She is alert and orient x3 and  in no acute distress Ortho Exam Examination of her left knee does show moderate effusion.  There is varus malalignment that is correctable and patellofemoral crepitation.  There is slight flexion contracture.  Her right hip moves more smoothly but there is pain in the groin with internal and external rotation. Specialty Comments:  No specialty comments available.  Imaging: No results found. Previous x-rays of the left knee and right hip were reviewed again from other visits in do show arthritis in both joints.  PMFS History: Patient Active Problem List   Diagnosis Date Noted   Preop testing 07/09/2018   Degenerative lumbar spinal stenosis 05/12/2018    Statin myopathy 08/29/2016   Iliotibial band syndrome of right side 04/23/2016   Acromioclavicular arthrosis 06/30/2015   Left carpal tunnel syndrome 05/15/2015   Adhesive capsulitis of right shoulder 02/06/2015   Dysarthria due to cerebrovascular accident 01/09/2015   Hemiparesis affecting dominant side as late effect of cerebrovascular accident ([facility]) 12/07/2014   Spastic neurogenic bladder 10/31/2014   Right rotator cuff tendonitis 10/19/2014   Stenosis of cervical spine region    Chronic right shoulder pain    Cerebral infarction due to thrombosis of left middle cerebral artery ([facility])    Left pontine stroke ([facility])    TIA (transient ischemic attack) 10/14/2014   Overactive bladder 10/14/2014   H/O: CVA (cerebrovascular accident) 10/14/2014   NECK PAIN, CHRONIC 10/29/2010   Essential hypertension 10/10/2009   INSOMNIA 09/15/2009   Pure hypercholesterolemia 11/28/2008   MENOPAUSAL SYNDROME 11/28/2008   Past Medical History:  Diagnosis Date   Cervical spine degeneration    C5/C6   Chronic right shoulder pain    Hemiparesis affecting dominant side as late effect of cerebrovascular accident ([facility]) 12/07/2014   Hyperlipemia    Hypertension    Menopausal syndrome    Floyde Parkins)   Overactive bladder    Statin intolerance     Family History  Problem Relation Age of Onset   Heart failure Father    Diabetes Mother    Hypertension Mother    Stroke Mother    Cirrhosis Sister    Diabetes Sister    Hyperlipidemia Brother    Hypertension Brother    Stroke Brother    Diabetes Brother     Past Surgical History:  Procedure Laterality Date   ABDOMINAL HYSTERECTOMY     1985   BAND HEMORRHOIDECTOMY     2010   CHOLECYSTECTOMY     1950   PAROTIDECTOMY     30 years ago   Social History   Occupational History   Occupation: retired  Tobacco Use   Smoking status: Never   Smokeless tobacco: Never  Substance and Sexual Activity   Alcohol use: No   Drug use: No   Sexual  activity: Not on file

## 2021-06-14 NOTE — Telephone Encounter (Signed)
Noted  

## 2021-06-26 ENCOUNTER — Encounter: Payer: Self-pay | Admitting: Physical Medicine and Rehabilitation

## 2021-06-26 ENCOUNTER — Ambulatory Visit (INDEPENDENT_AMBULATORY_CARE_PROVIDER_SITE_OTHER): Payer: Medicare PPO | Admitting: Physical Medicine and Rehabilitation

## 2021-06-26 ENCOUNTER — Other Ambulatory Visit: Payer: Self-pay

## 2021-06-26 ENCOUNTER — Ambulatory Visit: Payer: Self-pay

## 2021-06-26 DIAGNOSIS — M25551 Pain in right hip: Secondary | ICD-10-CM

## 2021-06-26 NOTE — Progress Notes (Signed)
   Teresa Martinez - 80 y.o. female MRN [id number]  Date of birth: [birth year]  Office Visit Note: Visit Date: 06/26/2021 PCP: Philip Aspen, Limmie Patricia, MD Referred by: Philip Aspen, Estel*  Subjective: Chief Complaint  Patient presents with   Right Hip - Pain   HPI:  Teresa Martinez is a 80 y.o. female who comes in today at the request of Dr. Doneen Poisson for planned Right anesthetic hip arthrogram with fluoroscopic guidance.  The patient has failed conservative care including home exercise, medications, time and activity modification.  This injection will be diagnostic and hopefully therapeutic.  Please see requesting physician notes for further details and justification.   ROS Otherwise per HPI.  Assessment & Plan: Visit Diagnoses:    ICD-10-CM   1. Pain in right hip  M25.551 XR C-ARM NO REPORT    Large Joint Inj: R hip joint      Plan: No additional findings.   Meds & Orders: No orders of the defined types were placed in this encounter.   Orders Placed This Encounter  Procedures   Large Joint Inj: R hip joint   XR C-ARM NO REPORT    Follow-up: Return if symptoms worsen or fail to improve.   Procedures: Large Joint Inj: R hip joint on 06/26/2021 3:14 PM Indications: diagnostic evaluation and pain Details: 22 G 3.5 in needle, fluoroscopy-guided anterior approach  Arthrogram: No  Medications: 4 mL bupivacaine 0.25 %; 60 mg triamcinolone acetonide 40 MG/ML Outcome: tolerated well, no immediate complications  There was excellent flow of contrast producing a partial arthrogram of the hip. The patient did have relief of symptoms during the anesthetic phase of the injection. Procedure, treatment alternatives, risks and benefits explained, specific risks discussed. Consent was given by the patient. Immediately prior to procedure a time out was called to verify the correct patient, procedure, equipment, support staff and site/side marked as required.  Patient was prepped and draped in the usual sterile fashion.         Clinical History: 03/15/2021 An AP pelvis and lateral right hip shows some mild arthritic changes with  slight superior lateral joint space narrowing and a small para-articular  osteophyte.     Objective:  VS:  HT:    WT:   BMI:     BP:   HR: bpm  TEMP: ( )  RESP:  Physical Exam   Imaging: No results found.

## 2021-06-26 NOTE — Progress Notes (Signed)
Pt state right hip pain.  Numeric Pain Rating Scale and Functional Assessment Average Pain 5   In the last MONTH (on 0-10 scale) has pain interfered with the following?  1. General activity like being  able to carry out your everyday physical activities such as walking, climbing stairs, carrying groceries, or moving a chair?  Rating(10)    -BT, -Dye Allergies.

## 2021-06-30 DIAGNOSIS — M545 Low back pain, unspecified: Secondary | ICD-10-CM | POA: Diagnosis not present

## 2021-06-30 DIAGNOSIS — G8929 Other chronic pain: Secondary | ICD-10-CM | POA: Diagnosis not present

## 2021-06-30 DIAGNOSIS — F411 Generalized anxiety disorder: Secondary | ICD-10-CM | POA: Diagnosis not present

## 2021-06-30 DIAGNOSIS — I69351 Hemiplegia and hemiparesis following cerebral infarction affecting right dominant side: Secondary | ICD-10-CM | POA: Diagnosis not present

## 2021-06-30 DIAGNOSIS — M1990 Unspecified osteoarthritis, unspecified site: Secondary | ICD-10-CM | POA: Diagnosis not present

## 2021-06-30 DIAGNOSIS — G629 Polyneuropathy, unspecified: Secondary | ICD-10-CM | POA: Diagnosis not present

## 2021-06-30 DIAGNOSIS — N952 Postmenopausal atrophic vaginitis: Secondary | ICD-10-CM | POA: Diagnosis not present

## 2021-06-30 DIAGNOSIS — I10 Essential (primary) hypertension: Secondary | ICD-10-CM | POA: Diagnosis not present

## 2021-06-30 DIAGNOSIS — E785 Hyperlipidemia, unspecified: Secondary | ICD-10-CM | POA: Diagnosis not present

## 2021-07-17 ENCOUNTER — Telehealth: Payer: Self-pay

## 2021-07-17 MED ORDER — TRIAMCINOLONE ACETONIDE 40 MG/ML IJ SUSP
60.0000 mg | INTRAMUSCULAR | Status: AC | PRN
  Administered 2021-06-26: 60 mg via INTRA_ARTICULAR

## 2021-07-17 MED ORDER — BUPIVACAINE HCL 0.25 % IJ SOLN
4.0000 mL | INTRAMUSCULAR | Status: AC | PRN
  Administered 2021-06-26: 4 mL via INTRA_ARTICULAR

## 2021-07-17 NOTE — Telephone Encounter (Signed)
VOB submitted for Monovisc, left knee. Pending BV. 

## 2021-07-26 ENCOUNTER — Other Ambulatory Visit: Payer: Self-pay

## 2021-07-27 ENCOUNTER — Ambulatory Visit (INDEPENDENT_AMBULATORY_CARE_PROVIDER_SITE_OTHER): Payer: Medicare PPO | Admitting: Internal Medicine

## 2021-07-27 ENCOUNTER — Encounter: Payer: Self-pay | Admitting: Internal Medicine

## 2021-07-27 VITALS — BP 130/80 | HR 80 | Temp 97.8°F | Ht 65.5 in | Wt 128.6 lb

## 2021-07-27 DIAGNOSIS — Z23 Encounter for immunization: Secondary | ICD-10-CM | POA: Diagnosis not present

## 2021-07-27 DIAGNOSIS — E7800 Pure hypercholesterolemia, unspecified: Secondary | ICD-10-CM | POA: Diagnosis not present

## 2021-07-27 DIAGNOSIS — Z0000 Encounter for general adult medical examination without abnormal findings: Secondary | ICD-10-CM | POA: Diagnosis not present

## 2021-07-27 DIAGNOSIS — Z8673 Personal history of transient ischemic attack (TIA), and cerebral infarction without residual deficits: Secondary | ICD-10-CM | POA: Diagnosis not present

## 2021-07-27 DIAGNOSIS — I10 Essential (primary) hypertension: Secondary | ICD-10-CM

## 2021-07-27 DIAGNOSIS — R7302 Impaired glucose tolerance (oral): Secondary | ICD-10-CM | POA: Insufficient documentation

## 2021-07-27 LAB — CBC WITH DIFFERENTIAL/PLATELET
Basophils Absolute: 0 10*3/uL (ref 0.0–0.1)
Basophils Relative: 0.4 % (ref 0.0–3.0)
Eosinophils Absolute: 0.2 10*3/uL (ref 0.0–0.7)
Eosinophils Relative: 3 % (ref 0.0–5.0)
HCT: 43.7 % (ref 36.0–46.0)
Hemoglobin: 14.2 g/dL (ref 12.0–15.0)
Lymphocytes Relative: 22.8 % (ref 12.0–46.0)
Lymphs Abs: 1.5 10*3/uL (ref 0.7–4.0)
MCHC: 32.5 g/dL (ref 30.0–36.0)
MCV: 81.5 fl (ref 78.0–100.0)
Monocytes Absolute: 0.6 10*3/uL (ref 0.1–1.0)
Monocytes Relative: 8.3 % (ref 3.0–12.0)
Neutro Abs: 4.4 10*3/uL (ref 1.4–7.7)
Neutrophils Relative %: 65.5 % (ref 43.0–77.0)
Platelets: 234 10*3/uL (ref 150.0–400.0)
RBC: 5.36 Mil/uL — ABNORMAL HIGH (ref 3.87–5.11)
RDW: 15.1 % (ref 11.5–15.5)
WBC: 6.8 10*3/uL (ref 4.0–10.5)

## 2021-07-27 LAB — LIPID PANEL
Cholesterol: 175 mg/dL (ref 0–200)
HDL: 60.5 mg/dL (ref >39.00)
LDL Cholesterol: 99 mg/dL (ref 0–99)
NonHDL: 114.66
Total CHOL/HDL Ratio: 3
Triglycerides: 76 mg/dL (ref 0.0–149.0)
VLDL: 15.2 mg/dL (ref 0.0–40.0)

## 2021-07-27 LAB — HEMOGLOBIN A1C: Hgb A1c MFr Bld: 6 % (ref 4.6–6.5)

## 2021-07-27 LAB — COMPREHENSIVE METABOLIC PANEL
ALT: 18 U/L (ref 0–35)
AST: 21 U/L (ref 0–37)
Albumin: 4.3 g/dL (ref 3.5–5.2)
Alkaline Phosphatase: 57 U/L (ref 39–117)
BUN: 12 mg/dL (ref 6–23)
CO2: 29 mEq/L (ref 19–32)
Calcium: 9.5 mg/dL (ref 8.4–10.5)
Chloride: 106 mEq/L (ref 96–112)
Creatinine, Ser: 0.78 mg/dL (ref 0.40–1.20)
GFR: 70.85 mL/min (ref >60.00)
Glucose, Bld: 96 mg/dL (ref 70–99)
Potassium: 4.2 mEq/L (ref 3.5–5.1)
Sodium: 143 mEq/L (ref 135–145)
Total Bilirubin: 0.4 mg/dL (ref 0.2–1.2)
Total Protein: 7 g/dL (ref 6.0–8.3)

## 2021-07-27 LAB — VITAMIN D 25 HYDROXY (VIT D DEFICIENCY, FRACTURES): VITD: 42.01 ng/mL (ref 30.00–100.00)

## 2021-07-27 LAB — TSH: TSH: 0.8 u[IU]/mL (ref 0.35–5.50)

## 2021-07-27 LAB — VITAMIN B12: Vitamin B-12: 1014 pg/mL — ABNORMAL HIGH (ref 211–911)

## 2021-07-27 NOTE — Addendum Note (Signed)
Addended by: Kern Reap B on: 07/27/2021 11:49 AM   Modules accepted: Orders

## 2021-07-27 NOTE — Addendum Note (Signed)
Addended by: Kandra Nicolas on: 07/27/2021 10:38 AM   Modules accepted: Orders

## 2021-07-27 NOTE — Patient Instructions (Signed)
-  Nice seeing you today!!  -Lab work today; will notify you once results are available.  -Flu vaccine today.  -Remember tetanus vaccine at the pharmacy.  -Schedule follow up in 6 months.

## 2021-07-27 NOTE — Progress Notes (Signed)
Established Patient Office Visit     This visit occurred during the SARS-CoV-2 public health emergency.  Safety protocols were in place, including screening questions prior to the visit, additional usage of staff PPE, and extensive cleaning of exam room while observing appropriate contact time as indicated for disinfecting solutions.    CC/Reason for Visit: Annual preventive exam and subsequent Medicare wellness visit  HPI: Teresa Martinez is a 80 y.o. female who is coming in today for the above mentioned reasons. Past Medical History is significant for: Hypertension and hyperlipidemia.  She is now on evolocumab via Dr. Rennis Golden for her hyperlipidemia in addition to ezetimibe.  She has significant statin myalgias.  She is doing well and has no concerns today.  She has routine eye and dental care.  No issues with her hearing.  She is requesting flu vaccine today.  She is due for an updated Tdap booster.  She has a mammogram every year in December.  She has elected to defer colon and cervical cancer screening due to age.   Past Medical/Surgical History: Past Medical History:  Diagnosis Date   Cervical spine degeneration    C5/C6   Chronic right shoulder pain    Hemiparesis affecting dominant side as late effect of cerebrovascular accident (HCC) 12/07/2014   Hyperlipemia    Hypertension    Menopausal syndrome    Floyde Parkins)   Overactive bladder    Statin intolerance     Past Surgical History:  Procedure Laterality Date   ABDOMINAL HYSTERECTOMY     1985   BAND HEMORRHOIDECTOMY     2010   CHOLECYSTECTOMY     1950   LUMBAR LAMINECTOMY Bilateral 08/03/2018   Dr. Christain Sacramento, L3 laminotomy, L4-L5 laminectomy with neural foraminal decompression.   PAROTIDECTOMY     30 years ago    Social History:  reports that she has never smoked. She has never used smokeless tobacco. She reports that she does not drink alcohol and does not use drugs.  Allergies: Allergies   Allergen Reactions   Statins Other (See Comments)    Muscle pain and unable to walk Muscle pain and unable to walk   Wasp Venom    Latex Other (See Comments)    Burns her skin    Family History:  Family History  Problem Relation Age of Onset   Heart failure Father    Diabetes Mother    Hypertension Mother    Stroke Mother    Cirrhosis Sister    Diabetes Sister    Hyperlipidemia Brother    Hypertension Brother    Stroke Brother    Diabetes Brother      Current Outpatient Medications:    aspirin EC 325 MG EC tablet, Take 1 tablet (325 mg total) by mouth daily., Disp: 100 tablet, Rfl: 1   conjugated estrogens (PREMARIN) vaginal cream, Place 0.5 g vaginally., Disp: , Rfl:    Evolocumab (REPATHA SURECLICK) 140 MG/ML SOAJ, Inject 1 Dose into the skin every 14 (fourteen) days., Disp: 2 mL, Rfl: 11   ezetimibe (ZETIA) 10 MG tablet, TAKE 1 TABLET(10 MG) BY MOUTH DAILY, Disp: 90 tablet, Rfl: 1   fesoterodine (TOVIAZ) 4 MG TB24 tablet, TAKE 1 TABLET(4 MG) BY MOUTH DAILY, Disp: 90 tablet, Rfl: 1   lisinopril (ZESTRIL) 10 MG tablet, TAKE 1 TABLET(10 MG) BY MOUTH DAILY, Disp: 90 tablet, Rfl: 1  Review of Systems:  Constitutional: Denies fever, chills, diaphoresis, appetite change and fatigue.  HEENT: Denies photophobia, eye  pain, redness, hearing loss, ear pain, congestion, sore throat, rhinorrhea, sneezing, mouth sores, trouble swallowing, neck pain, neck stiffness and tinnitus.   Respiratory: Denies SOB, DOE, cough, chest tightness,  and wheezing.   Cardiovascular: Denies chest pain, palpitations and leg swelling.  Gastrointestinal: Denies nausea, vomiting, abdominal pain, diarrhea, constipation, blood in stool and abdominal distention.  Genitourinary: Denies dysuria, urgency, frequency, hematuria, flank pain and difficulty urinating.  Endocrine: Denies: hot or cold intolerance, sweats, changes in hair or nails, polyuria, polydipsia. Musculoskeletal: Denies myalgias, back pain, joint  swelling, arthralgias and gait problem.  Skin: Denies pallor, rash and wound.  Neurological: Denies dizziness, seizures, syncope, weakness, light-headedness, numbness and headaches.  Hematological: Denies adenopathy. Easy bruising, personal or family bleeding history  Psychiatric/Behavioral: Denies suicidal ideation, mood changes, confusion, nervousness, sleep disturbance and agitation    Physical Exam: Vitals:   07/27/21 0952  BP: 130/80  Pulse: 80  Temp: 97.8 F (36.6 C)  TempSrc: Oral  SpO2: 98%  Weight: 128 lb 9.6 oz (58.3 kg)  Height: 5' 5.5" (1.664 m)    Body mass index is 21.07 kg/m.   Constitutional: NAD, calm, comfortable, ambulates with a cane Eyes: PERRL, lids and conjunctivae normal ENMT: Mucous membranes are moist. Posterior pharynx clear of any exudate or lesions. Normal dentition. Tympanic membrane is pearly white, no erythema or bulging. Neck: normal, supple, no masses, no thyromegaly Respiratory: clear to auscultation bilaterally, no wheezing, no crackles. Normal respiratory effort. No accessory muscle use.  Cardiovascular: Regular rate and rhythm, no murmurs / rubs / gallops. No extremity edema. 2+ pedal pulses. No carotid bruits.  Abdomen: no tenderness, no masses palpated. No hepatosplenomegaly. Bowel sounds positive.  Musculoskeletal: no clubbing / cyanosis. No joint deformity upper and lower extremities. Good ROM, no contractures. Normal muscle tone.  Skin: no rashes, lesions, ulcers. No induration Neurologic: CN 2-12 grossly intact. Sensation intact, DTR normal. Strength 5/5 in all 4.  Psychiatric: Normal judgment and insight. Alert and oriented x 3. Normal mood.   Subsequent Medicare wellness visit   1. Risk factors, based on past  M,S,F -cardiovascular disease risk factors include age, history of hypertension and hyperlipidemia   2.  Physical activities: No physical activity for exercise but she is very active in day-to-day activities   3.   Depression/mood: Stable, not depressed   4.  Hearing: No perceived issues   5.  ADL's: Independent in all ADLs   6.  Fall risk: Low fall risk   7.  Home safety: No problems identified   8.  Height weight, and visual acuity: height and weight as above, vision:  Vision Screening   Right eye Left eye Both eyes  Without correction 20/25 20/25 20/25   With correction        9.  Counseling: Advise she update her vaccination status   10. Lab orders based on risk factors: Laboratory update will be reviewed   11. Referral : None today   12. Care plan: Follow-up with me in 6 months   13. Cognitive assessment: No cognitive impairment   14. Screening: Patient provided with a written and personalized 5-10 year screening schedule in the AVS. yes   15. Provider List Update: PCP, cardiology Dr.  16. Advance Directives: Full code   17. Opioids: Patient is not on any opioid prescriptions and has no risk factors for a substance use disorder.   Flowsheet Row Office Visit from 12/15/2020 in [facility] [place]  PHQ-9 Total Score 1  Fall Risk  07/27/2021 01/26/2021 07/23/2019 12/02/2018 03/12/2018  Falls in the past year? 1 0 0 0 No  Comment - - - - -  Number falls in past yr: 0 0 0 0 -  Injury with Fall? - 0 0 0 -  Risk for fall due to : - No Fall Risks - - -  Follow up - Falls evaluation completed - - -  Comment - - - - -     Impression and Plan:  Encounter for preventive health examination -She has routine eye and dental care. -Flu vaccine in office today, she will update Tdap booster at pharmacy, otherwise immunizations are up-to-date. -Screening labs today. -Healthy lifestyle discussed in detail. -She gets a mammogram every December. -She has elected to defer colon and cervical cancer screening due to age, I agree -She had a DEXA scan in 2021 without evidence for osteoporosis.  Essential hypertension  -Well-controlled, she also brings in ambulatory  measurements that show systolics in the 110s to low 130s and diastolics in the 60s to 80s. Pure hypercholesterolemia  - Plan: Lipid panel -On acetamide and evolocumab  H/O: CVA (cerebrovascular accident) -Noted    Patient Instructions  -Nice seeing you today!!  -Lab work today; will notify you once results are available.  -Flu vaccine today.  -Remember tetanus vaccine at the pharmacy.  -Schedule follow up in 6 months.     Chaya Jan, MD [facility]

## 2021-07-31 ENCOUNTER — Telehealth: Payer: Self-pay

## 2021-07-31 NOTE — Telephone Encounter (Signed)
Called and left a VM advising patient to CB to schedule with Dr. Blackman for gel injection.  Approved for Monovisc, left knee. Buy & Bill Once OOP has been met, patient will be covered at 100%. Co-pay of $40.00 PA Approval#[id number] Valid 07/17/2021- 10/15/2021  

## 2021-08-14 DIAGNOSIS — M5441 Lumbago with sciatica, right side: Secondary | ICD-10-CM | POA: Diagnosis not present

## 2021-08-14 DIAGNOSIS — M9903 Segmental and somatic dysfunction of lumbar region: Secondary | ICD-10-CM | POA: Diagnosis not present

## 2021-08-15 DIAGNOSIS — M9903 Segmental and somatic dysfunction of lumbar region: Secondary | ICD-10-CM | POA: Diagnosis not present

## 2021-08-15 DIAGNOSIS — M5441 Lumbago with sciatica, right side: Secondary | ICD-10-CM | POA: Diagnosis not present

## 2021-08-16 DIAGNOSIS — E785 Hyperlipidemia, unspecified: Secondary | ICD-10-CM | POA: Diagnosis not present

## 2021-08-17 DIAGNOSIS — M9903 Segmental and somatic dysfunction of lumbar region: Secondary | ICD-10-CM | POA: Diagnosis not present

## 2021-08-17 DIAGNOSIS — M5441 Lumbago with sciatica, right side: Secondary | ICD-10-CM | POA: Diagnosis not present

## 2021-08-18 LAB — NMR, LIPOPROFILE
Cholesterol, Total: 168 mg/dL (ref 100–199)
HDL Particle Number: 41 umol/L (ref >=30.5)
HDL-C: 61 mg/dL (ref >39)
LDL Particle Number: 1248 nmol/L — ABNORMAL HIGH (ref <1000)
LDL Size: 21.1 nm (ref >20.5)
LDL-C (NIH Calc): 89 mg/dL (ref 0–99)
LP-IR Score: 42 (ref <=45)
Small LDL Particle Number: 678 nmol/L — ABNORMAL HIGH (ref <=527)
Triglycerides: 98 mg/dL (ref 0–149)

## 2021-08-18 LAB — LIPOPROTEIN A (LPA): Lipoprotein (a): 235.2 nmol/L — ABNORMAL HIGH (ref <75.0)

## 2021-08-21 DIAGNOSIS — M9903 Segmental and somatic dysfunction of lumbar region: Secondary | ICD-10-CM | POA: Diagnosis not present

## 2021-08-21 DIAGNOSIS — M5441 Lumbago with sciatica, right side: Secondary | ICD-10-CM | POA: Diagnosis not present

## 2021-08-22 ENCOUNTER — Other Ambulatory Visit: Payer: Self-pay

## 2021-08-22 ENCOUNTER — Ambulatory Visit: Payer: Medicare PPO | Admitting: Orthopaedic Surgery

## 2021-08-22 ENCOUNTER — Encounter: Payer: Self-pay | Admitting: Orthopaedic Surgery

## 2021-08-22 DIAGNOSIS — M1712 Unilateral primary osteoarthritis, left knee: Secondary | ICD-10-CM

## 2021-08-22 MED ORDER — HYALURONAN 88 MG/4ML IX SOSY
88.0000 mg | PREFILLED_SYRINGE | INTRA_ARTICULAR | Status: AC | PRN
  Administered 2021-08-22: 88 mg via INTRA_ARTICULAR

## 2021-08-22 NOTE — Progress Notes (Signed)
   Procedure Note  Patient: Teresa Martinez             Date of Birth: January 02, 1941           MRN: [id number]             Visit Date: 08/22/2021  Procedures: Visit Diagnoses:  1. Unilateral primary osteoarthritis, left knee     Large Joint Inj: L knee on 08/22/2021 10:42 AM Indications: diagnostic evaluation and pain Details: 22 G 1.5 in needle, superolateral approach  Arthrogram: No  Medications: 88 mg Hyaluronan 88 MG/4ML Outcome: tolerated well, no immediate complications Procedure, treatment alternatives, risks and benefits explained, specific risks discussed. Consent was given by the patient. Immediately prior to procedure a time out was called to verify the correct patient, procedure, equipment, support staff and site/side marked as required. Patient was prepped and draped in the usual sterile fashion.   The patient comes in today for scheduled hyaluronic acid injection with Monovisc in the left knee to treat the pain from osteoarthritis that is been well-documented.  She is 80 years old.  She does ambulate using a cane.  She has tried and failed other conservative treatment measures including steroid injections.  She has had hyaluronic acid in the past and this is helped her the most.  She has had no acute change in her medical status.  Examination of her left knee shows no effusion no varus malalignment with patellofemoral crepitation and medial joint line tenderness.  I did place Monovisc in the left knee without difficulty.  All questions and concerns were answered and addressed.  She knows to wait a minimum of 6 months between these injections.

## 2021-08-23 ENCOUNTER — Ambulatory Visit: Payer: Medicare PPO | Admitting: Internal Medicine

## 2021-08-23 ENCOUNTER — Encounter: Payer: Self-pay | Admitting: Internal Medicine

## 2021-08-23 VITALS — BP 159/94 | HR 89 | Ht 66.0 in | Wt 128.4 lb

## 2021-08-23 DIAGNOSIS — Z8673 Personal history of transient ischemic attack (TIA), and cerebral infarction without residual deficits: Secondary | ICD-10-CM | POA: Diagnosis not present

## 2021-08-23 DIAGNOSIS — T466X5A Adverse effect of antihyperlipidemic and antiarteriosclerotic drugs, initial encounter: Secondary | ICD-10-CM

## 2021-08-23 DIAGNOSIS — T466X5D Adverse effect of antihyperlipidemic and antiarteriosclerotic drugs, subsequent encounter: Secondary | ICD-10-CM | POA: Diagnosis not present

## 2021-08-23 DIAGNOSIS — M5441 Lumbago with sciatica, right side: Secondary | ICD-10-CM | POA: Diagnosis not present

## 2021-08-23 DIAGNOSIS — M9903 Segmental and somatic dysfunction of lumbar region: Secondary | ICD-10-CM | POA: Diagnosis not present

## 2021-08-23 DIAGNOSIS — M791 Myalgia, unspecified site: Secondary | ICD-10-CM | POA: Diagnosis not present

## 2021-08-23 DIAGNOSIS — E7841 Elevated Lipoprotein(a): Secondary | ICD-10-CM | POA: Diagnosis not present

## 2021-08-23 DIAGNOSIS — E7849 Other hyperlipidemia: Secondary | ICD-10-CM

## 2021-08-23 NOTE — Progress Notes (Signed)
LIPID CLINIC CONSULT NOTE  Chief Complaint:  Follow-up dyslipidemia  Primary Care Physician: Philip Aspen, Limmie Patricia, MD  Primary Cardiologist:  None  HPI:  Teresa Martinez is a 80 y.o. female who is being seen today for the evaluation of dyslipidemia at the request of Philip Aspen, Almira Bar*.  This is a pleasant 80 year old female kindly referred for evaluation management of dyslipidemia.  Interestingly she has been very athletic most of her life in fact competing in marathon races even into her 7s.  Unfortunately she had a stroke in 2016 and since then had some mediate issues with hemiparesis but says she has never been quite the same.  She also has a history of hypertension, dyslipidemia, mild bilateral carotid disease and some cerebrovascular atherosclerosis based on CT imaging.  She has been intolerant to statins and reports a strong family history of high cholesterol in both of her twin daughters, her aunt, her brother and other family members.  Initial blood pressure was 162/94 however she said it is typically running around 130/78.  Lipids were reassessed in March showing total cholesterol 244, HDL 55, HDL 172 and triglycerides 86.  She has been on ezetimibe but she says it does not seem to be effective for her.  Previously she had taken statins which caused her severe side effects.  She had tried 3 different statins with her prior primary care provider to cause significant inability to walk and she said she would not ever take them again.  08/23/2021  Mrs. Martinez returns today for follow-up.  She is done well on Repatha.  She seems to be tolerating however does have some injection site redness.  She said initially after the first several doses it was not an issue however she has now developed some that may last up to about a week.  She says it is itchy and tender but tolerable.  Her cholesterol though has responded nicely.  Her total cholesterol is now 168, HDL 61,  LDL 89 and triglycerides 98.  Her LDL has come down from 172.  Particle number was [id number].  Her LP(a) is elevated and probably was implicated in her stroke.  I suspect this number is now about 20 to 30% lower than it was previously, at 235.  Although she had history of stroke, according to the criteria for the [employer] study for a specific LP(a) lowering medication, she would not qualify because she would have had to have had prior PCI and history of stroke.  PMHx:  Past Medical History:  Diagnosis Date   Cervical spine degeneration    C5/C6   Chronic right shoulder pain    Hemiparesis affecting dominant side as late effect of cerebrovascular accident (HCC) 12/07/2014   Hyperlipemia    Hypertension    Menopausal syndrome    Floyde Parkins)   Overactive bladder    Statin intolerance     Past Surgical History:  Procedure Laterality Date   ABDOMINAL HYSTERECTOMY     1985   BAND HEMORRHOIDECTOMY     2010   CHOLECYSTECTOMY     1950   LUMBAR LAMINECTOMY Bilateral 08/03/2018   Dr. Christain Sacramento, L3 laminotomy, L4-L5 laminectomy with neural foraminal decompression.   PAROTIDECTOMY     30 years ago    FAMHx:  Family History  Problem Relation Age of Onset   Heart failure Father    Diabetes Mother    Hypertension Mother    Stroke Mother    Cirrhosis Sister    Diabetes  Sister    Hyperlipidemia Brother    Hypertension Brother    Stroke Brother    Diabetes Brother     SOCHx:   reports that she has never smoked. She has never used smokeless tobacco. She reports that she does not drink alcohol and does not use drugs.  ALLERGIES:  Allergies  Allergen Reactions   Statins Other (See Comments)    Muscle pain and unable to walk Muscle pain and unable to walk   Wasp Venom    Latex Other (See Comments)    Burns her skin    ROS: Pertinent items noted in HPI and remainder of comprehensive ROS otherwise negative.  HOME MEDS: Current Outpatient Medications on File Prior to Visit   Medication Sig Dispense Refill   aspirin EC 325 MG EC tablet Take 1 tablet (325 mg total) by mouth daily. 100 tablet 1   conjugated estrogens (PREMARIN) vaginal cream Place 0.5 g vaginally.     Evolocumab (REPATHA SURECLICK) 140 MG/ML SOAJ Inject 1 Dose into the skin every 14 (fourteen) days. 2 mL 11   ezetimibe (ZETIA) 10 MG tablet TAKE 1 TABLET(10 MG) BY MOUTH DAILY 90 tablet 1   fesoterodine (TOVIAZ) 4 MG TB24 tablet TAKE 1 TABLET(4 MG) BY MOUTH DAILY 90 tablet 1   lisinopril (ZESTRIL) 10 MG tablet TAKE 1 TABLET(10 MG) BY MOUTH DAILY 90 tablet 1   No current facility-administered medications on file prior to visit.    LABS/IMAGING: No results found for this or any previous visit (from the past 48 hour(s)). No results found.  LIPID PANEL:    Component Value Date/Time   CHOL 175 07/27/2021 1038   TRIG 76.0 07/27/2021 1038   HDL 60.50 07/27/2021 1038   CHOLHDL 3 07/27/2021 1038   VLDL 15.2 07/27/2021 1038   LDLCALC 99 07/27/2021 1038   LDLCALC 142 (H) 07/26/2020 1009   LDLDIRECT 205.1 07/30/2013 0946    WEIGHTS: Wt Readings from Last 3 Encounters:  08/23/21 128 lb 6.4 oz (58.2 kg)  07/27/21 128 lb 9.6 oz (58.3 kg)  05/22/21 134 lb 12.8 oz (61.1 kg)    VITALS: BP (!) 159/94   Pulse 89   Ht 5\' 6"  (1.676 m)   Wt 128 lb 6.4 oz (58.2 kg)   SpO2 98%   BMI 20.72 kg/m   EXAM: Deferred  EKG: Deferred  ASSESSMENT: Probable familial hyperlipidemia History of stroke (2016) Mild bilateral carotid disease-ultrasound in 2015 Hypertension Family history of high cholesterol and multiple family members and twin daughters Elevated LP(a)-235  PLAN: 1.   Ms. Martinez is responding nicely to Repatha.  Her cholesterols come down significantly.  Her LP(a) is high at 235, probably implicated in her stroke.  This should be about 20 to 30% lower on this medication.  Unfortunately she would not qualify for specific inhibitor at this time because no history of prior PCI even  though she had a stroke.  We will continue with this current therapy.  She has follow-up with her primary care provider in March and can see me annually or sooner as necessary.  April, MD, [facility], FACP  [place]  [facility]  Medical Director of the Advanced Lipid Disorders &  Cardiovascular Risk Reduction Clinic Diplomate of the American Board of Clinical Lipidology Attending Cardiologist  Direct Dial: [phone]  Fax: [phone]  Website:  [email or web address]  [phone] Karalyne Nusser 08/23/2021, 8:12 AM

## 2021-08-23 NOTE — Patient Instructions (Signed)
Medication Instructions:  No Changes In Medications at this time.  *If you need a refill on your cardiac medications before your next appointment, please call your pharmacy*  Follow-Up: At [facility], you and your health needs are our priority.  As part of our continuing mission to provide you with exceptional heart care, we have created designated Provider Care Teams.  These Care Teams include your primary Cardiologist (physician) and Advanced Practice Providers (APPs -  Physician Assistants and Nurse Practitioners) who all work together to provide you with the care you need, when you need it.  Your next appointment:   1 year(s) LIPID CLINIC   The format for your next appointment:   In Person  Provider:   K. Italy Hilty, MD

## 2021-08-24 DIAGNOSIS — M5441 Lumbago with sciatica, right side: Secondary | ICD-10-CM | POA: Diagnosis not present

## 2021-08-24 DIAGNOSIS — M9903 Segmental and somatic dysfunction of lumbar region: Secondary | ICD-10-CM | POA: Diagnosis not present

## 2021-08-29 DIAGNOSIS — M5441 Lumbago with sciatica, right side: Secondary | ICD-10-CM | POA: Diagnosis not present

## 2021-08-29 DIAGNOSIS — M9903 Segmental and somatic dysfunction of lumbar region: Secondary | ICD-10-CM | POA: Diagnosis not present

## 2021-08-31 DIAGNOSIS — M9903 Segmental and somatic dysfunction of lumbar region: Secondary | ICD-10-CM | POA: Diagnosis not present

## 2021-08-31 DIAGNOSIS — M5441 Lumbago with sciatica, right side: Secondary | ICD-10-CM | POA: Diagnosis not present

## 2021-09-03 ENCOUNTER — Telehealth: Payer: Self-pay

## 2021-09-05 DIAGNOSIS — M5441 Lumbago with sciatica, right side: Secondary | ICD-10-CM | POA: Diagnosis not present

## 2021-09-05 DIAGNOSIS — M9903 Segmental and somatic dysfunction of lumbar region: Secondary | ICD-10-CM | POA: Diagnosis not present

## 2021-09-05 NOTE — Telephone Encounter (Signed)
Dates are 08/27/21 - 09/18/21. Form placed in Dr Hardie Shackleton folder

## 2021-09-06 ENCOUNTER — Telehealth: Payer: Self-pay | Admitting: Internal Medicine

## 2021-09-06 NOTE — Telephone Encounter (Signed)
Patient called to follow up on FMLA paperwork and to speak with Fleet Contras. I let them know that they would receive a call when paperwork was finished. Patient still would like to speak with Fleet Contras.      Good callback number is [phone]     Please advise

## 2021-09-07 DIAGNOSIS — M5441 Lumbago with sciatica, right side: Secondary | ICD-10-CM | POA: Diagnosis not present

## 2021-09-07 DIAGNOSIS — M9903 Segmental and somatic dysfunction of lumbar region: Secondary | ICD-10-CM | POA: Diagnosis not present

## 2021-09-07 NOTE — Telephone Encounter (Signed)
Forms are complete and ready to be picked up.  Ms Teresa Martinez is aware.

## 2021-09-10 DIAGNOSIS — M5441 Lumbago with sciatica, right side: Secondary | ICD-10-CM | POA: Diagnosis not present

## 2021-09-10 DIAGNOSIS — M9903 Segmental and somatic dysfunction of lumbar region: Secondary | ICD-10-CM | POA: Diagnosis not present

## 2021-09-17 DIAGNOSIS — M9903 Segmental and somatic dysfunction of lumbar region: Secondary | ICD-10-CM | POA: Diagnosis not present

## 2021-09-17 DIAGNOSIS — M5441 Lumbago with sciatica, right side: Secondary | ICD-10-CM | POA: Diagnosis not present

## 2021-09-24 DIAGNOSIS — M533 Sacrococcygeal disorders, not elsewhere classified: Secondary | ICD-10-CM | POA: Diagnosis not present

## 2021-10-02 ENCOUNTER — Telehealth: Payer: Self-pay

## 2021-10-02 NOTE — Telephone Encounter (Signed)
Patient called stating the second half of the FMLA paperwork hasn't been received and would like them refax and a call when they are faxed

## 2021-10-03 NOTE — Telephone Encounter (Signed)
Spoke with patient and last form was refaxed per patient request.

## 2021-10-23 DIAGNOSIS — M533 Sacrococcygeal disorders, not elsewhere classified: Secondary | ICD-10-CM | POA: Diagnosis not present

## 2021-11-10 DIAGNOSIS — Z20822 Contact with and (suspected) exposure to covid-19: Secondary | ICD-10-CM | POA: Diagnosis not present

## 2021-11-13 ENCOUNTER — Telehealth: Payer: Medicare PPO | Admitting: Nurse Practitioner

## 2021-11-13 ENCOUNTER — Telehealth: Payer: Self-pay

## 2021-11-13 DIAGNOSIS — U071 COVID-19: Secondary | ICD-10-CM

## 2021-11-13 NOTE — Progress Notes (Signed)
Called patient for clarity on symptoms: Tested positive for COVID 11/10/21 She has been sick for over one week now - 4 days prior to testing (8 days ago)  She has coughed twice today Has a runny nose which is consistent for her  She has some tenderness on her chest not to touch and not when she takes a deep breath.  She is able to do her normal morning stretches and exercise without difficulty  She overall feels that she is doing OK   Recommended follow up with PCP this week discussed urgent reasons to be seen at [facility] no current emergency symptoms  Outside of the window for anti-viral   Based on what you shared with me, I feel your condition warrants further evaluation and I recommend that you be seen for a face to face visit.  Please contact your primary care physician practice to be seen. Many offices offer virtual options to be seen via video if you are not comfortable going in person to a medical facility at this time.  NOTE: You will NOT be charged for this eVisit.  If you do not have a PCP, [facility] offers a free physician referral service available at [phone]. Our trained staff has the experience, knowledge and resources to put you in touch with a physician who is right for you.    If you are having a true medical emergency please call 911.   Your e-visit answers were reviewed by a board certified advanced clinical practitioner to complete your personal care plan.  Thank you for using e-Visits.   I spent approximately 10 minutes reviewing the patient's history, current symptoms and coordinating their care today.

## 2021-11-13 NOTE — Telephone Encounter (Signed)
--  Caller just tested positive on home COVID test and want to know if they can get PAXLOVID sent over. Experiencing cough, chest congestion x3 days currently; caller says fever has resolved.    11/09/2021 10:40:22 PM Call PCP within 24 Hours Kadour, RN, [place]  User: Judeth Horn, RN Date/Time Lamount Cohen Time): 11/09/2021 10:41:14 PM Advised pt to call back tomorrow as on call cannot be paged from 9pm to 9am; I also advised her to go to urgent care within 24 hrs if not resolved.  11/13/21 1429: Pt states she is feeling better at this time. Covid positive on 11/09/21; symptoms began 11/04/21; was febrile x 2days that has resolved. Pt reports nasal congestion & cough. Pt notified that she may be out the window for antivirals, but she will do an e-visit to determine if qualify for anitviral.

## 2021-11-20 ENCOUNTER — Ambulatory Visit: Payer: Medicare PPO | Admitting: Internal Medicine

## 2021-11-21 ENCOUNTER — Ambulatory Visit (INDEPENDENT_AMBULATORY_CARE_PROVIDER_SITE_OTHER): Payer: Medicare PPO | Admitting: Internal Medicine

## 2021-11-21 ENCOUNTER — Encounter: Payer: Self-pay | Admitting: Internal Medicine

## 2021-11-21 ENCOUNTER — Other Ambulatory Visit: Payer: Self-pay | Admitting: Internal Medicine

## 2021-11-21 VITALS — BP 132/74 | HR 91 | Temp 98.0°F | Ht 66.0 in | Wt 135.0 lb

## 2021-11-21 DIAGNOSIS — J069 Acute upper respiratory infection, unspecified: Secondary | ICD-10-CM | POA: Diagnosis not present

## 2021-11-21 DIAGNOSIS — I10 Essential (primary) hypertension: Secondary | ICD-10-CM

## 2021-11-21 DIAGNOSIS — E7800 Pure hypercholesterolemia, unspecified: Secondary | ICD-10-CM

## 2021-11-21 NOTE — Patient Instructions (Signed)
-  Nice seeing you today!!  -Mucinex twice daily and zyrtec daily for about 10-14 days.

## 2021-11-21 NOTE — Progress Notes (Signed)
Established Patient Office Visit     This visit occurred during the SARS-CoV-2 public health emergency.  Safety protocols were in place, including screening questions prior to the visit, additional usage of staff PPE, and extensive cleaning of exam room while observing appropriate contact time as indicated for disinfecting solutions.    CC/Reason for Visit: Cough, mucus  HPI: Teresa Martinez is a 81 y.o. female who is coming in today for the above mentioned reasons.  She had COVID infection the week before Christmas.  Most of her symptoms have resolved but she remains with frequent throat clearing, runny nose and some lung congestion at times.  No shortness of breath.  Past Medical/Surgical History: Past Medical History:  Diagnosis Date   Cervical spine degeneration    C5/C6   Chronic right shoulder pain    Hemiparesis affecting dominant side as late effect of cerebrovascular accident ([place]) 12/07/2014   Hyperlipemia    Hypertension    Menopausal syndrome    Pamelia Hoit)   Overactive bladder    Statin intolerance     Past Surgical History:  Procedure Laterality Date   ABDOMINAL HYSTERECTOMY     1985   BAND HEMORRHOIDECTOMY     2010   CHOLECYSTECTOMY     1950   LUMBAR LAMINECTOMY Bilateral 08/03/2018   Dr. Octavio Manns, L3 laminotomy, L4-L5 laminectomy with neural foraminal decompression.   PAROTIDECTOMY     30 years ago    Social History:  reports that she has never smoked. She has never used smokeless tobacco. She reports that she does not drink alcohol and does not use drugs.  Allergies: Allergies  Allergen Reactions   Statins Other (See Comments)    Muscle pain and unable to walk Muscle pain and unable to walk   Wasp Venom    Latex Other (See Comments)    Burns her skin    Family History:  Family History  Problem Relation Age of Onset   Heart failure Father    Diabetes Mother    Hypertension Mother    Stroke Mother    Cirrhosis Sister     Diabetes Sister    Hyperlipidemia Brother    Hypertension Brother    Stroke Brother    Diabetes Brother      Current Outpatient Medications:    aspirin EC 325 MG EC tablet, Take 1 tablet (325 mg total) by mouth daily., Disp: 100 tablet, Rfl: 1   conjugated estrogens (PREMARIN) vaginal cream, Place 0.5 g vaginally., Disp: , Rfl:    Evolocumab (REPATHA SURECLICK) XX123456 MG/ML SOAJ, Inject 1 Dose into the skin every 14 (fourteen) days., Disp: 2 mL, Rfl: 11   ezetimibe (ZETIA) 10 MG tablet, TAKE 1 TABLET(10 MG) BY MOUTH DAILY, Disp: 90 tablet, Rfl: 1   fesoterodine (TOVIAZ) 4 MG TB24 tablet, TAKE 1 TABLET(4 MG) BY MOUTH DAILY, Disp: 90 tablet, Rfl: 1   lisinopril (ZESTRIL) 10 MG tablet, TAKE 1 TABLET(10 MG) BY MOUTH DAILY, Disp: 90 tablet, Rfl: 1  Review of Systems:  Constitutional: Denies fever, chills, diaphoresis, appetite change and fatigue.  HEENT: Denies photophobia, eye pain, redness, hearing loss, ear pain, mouth sores, trouble swallowing, neck pain, neck stiffness and tinnitus.   Respiratory: Denies SOB, DOE,  chest tightness,  and wheezing.   Cardiovascular: Denies chest pain, palpitations and leg swelling.  Gastrointestinal: Denies nausea, vomiting, abdominal pain, diarrhea, constipation, blood in stool and abdominal distention.  Genitourinary: Denies dysuria, urgency, frequency, hematuria, flank pain and difficulty urinating.  Endocrine: Denies: hot or cold intolerance, sweats, changes in hair or nails, polyuria, polydipsia. Musculoskeletal: Denies myalgias, back pain, joint swelling, arthralgias and gait problem.  Skin: Denies pallor, rash and wound.  Neurological: Denies dizziness, seizures, syncope, weakness, light-headedness, numbness and headaches.  Hematological: Denies adenopathy. Easy bruising, personal or family bleeding history  Psychiatric/Behavioral: Denies suicidal ideation, mood changes, confusion, nervousness, sleep disturbance and agitation    Physical  Exam: Vitals:   11/21/21 0937  BP: 132/74  Pulse: 91  Temp: 98 F (36.7 C)  TempSrc: Oral  SpO2: 98%  Weight: 135 lb (61.2 kg)  Height: 5\' 6"  (1.676 m)    Body mass index is 21.79 kg/m.   Constitutional: NAD, calm, comfortable Eyes: PERRL, lids and conjunctivae normal ENMT: Mucous membranes are moist.  Respiratory: clear to auscultation bilaterally, no wheezing, no crackles. Normal respiratory effort. No accessory muscle use.  Cardiovascular: Regular rate and rhythm, no murmurs / rubs / gallops. No extremity edema.  Neurologic: Grossly intact and nonfocal Psychiatric: Normal judgment and insight. Alert and oriented x 3. Normal mood.    Impression and Plan:  Viral upper respiratory tract infection -Given exam findings, PNA, pharyngitis, ear infection are not likely, hence abx have not been prescribed. -Have advised rest, fluids, OTC antihistamines, cough suppressants and mucinex. -RTC if no improvement in 10-14 days. -Likely a remnant of her recent COVID-19 infection.  Time spent: 20 minutes reviewing chart, interviewing and examining patient and formulating plan of care.     Patient Instructions  -Nice seeing you today!!  -Mucinex twice daily and zyrtec daily for about 10-14 days.    Lelon Frohlich, MD [facility]

## 2021-11-26 ENCOUNTER — Ambulatory Visit (INDEPENDENT_AMBULATORY_CARE_PROVIDER_SITE_OTHER): Payer: Medicare PPO

## 2021-11-26 ENCOUNTER — Telehealth: Payer: Self-pay | Admitting: Internal Medicine

## 2021-11-26 ENCOUNTER — Ambulatory Visit: Payer: Medicare PPO | Admitting: Orthopaedic Surgery

## 2021-11-26 DIAGNOSIS — M1712 Unilateral primary osteoarthritis, left knee: Secondary | ICD-10-CM | POA: Diagnosis not present

## 2021-11-26 NOTE — Telephone Encounter (Signed)
Patient is requesting a call back--no details offered after request.

## 2021-11-26 NOTE — Progress Notes (Signed)
Office Visit Note   Patient: Teresa Martinez           Date of Birth: [birth year]           MRN: [id number] Visit Date: 11/26/2021              Requested by: Isaac Bliss, Rayford Halsted, MD [place],  [place] [address] PCP: Isaac Bliss, Rayford Halsted, MD   Assessment & Plan: Visit Diagnoses:  1. Unilateral primary osteoarthritis, left knee     Plan: I did speak with her about knee replacement surgery and went over a knee model with her as well as her x-rays.  I described the surgery in detail and discussed the risk and benefits of surgery.  I would like to see her back on Monday, March 20.  That will be several days before her trip out of the country.  We can provide a steroid injection in her left knee at that visit and then see about scheduling her knee replacement.  She agrees with this treatment plan.  Follow-Up Instructions: Return in about 2 months (around 01/24/2022).   Orders:  Orders Placed This Encounter  Procedures   XR Knee 1-2 Views Left   No orders of the defined types were placed in this encounter.     Procedures: No procedures performed   Clinical Data: No additional findings.   Subjective: Chief Complaint  Patient presents with   Left Knee - Pain  The patient is very well-known to me.  She is 81 years old and has been dealing with left knee arthritis for some time now.  At this point she is interested in knee replacement surgery.  She does ambulate using a cane.  She is tried steroid injections in that left knee.  We did x-ray her knee today but her previous MRI showed full-thickness cartilage loss in the medial compartment of the knee and the patellofemoral joint.  She did recently lose her husband a few months ago.  She wants to consider knee replacement sometime in the spring.  She is going to travel out of the country in late March of this year.  She has had injections in the past in that knee.  She denies any other acute change  in her medical status.  She is not a diabetic and not on blood thinning medication.  She is thin and active.  HPI  Review of Systems There is currently listed no fever, chills, nausea, vomiting  Objective: Vital Signs: There were no vitals taken for this visit.  Physical Exam She is alert and oriented x3 and in no acute distress Ortho Exam Examination of her left knee does show varus malalignment that is not correctable.  She does not have a flexion contracture but good range of motion of the knee with patellofemoral crepitation and medial joint line tenderness. Specialty Comments:  No specialty comments available.  Imaging: XR Knee 1-2 Views Left  Result Date: 11/26/2021 2 views of the left knee show tricompartment arthritis with significant medial joint space narrowing and patellofemoral narrowing.  There is varus malalignment and osteophytes in all 3 compartments.    PMFS History: Patient Active Problem List   Diagnosis Date Noted   IGT (impaired glucose tolerance) 07/27/2021   Preop testing 07/09/2018   Degenerative lumbar spinal stenosis 05/12/2018   Statin myopathy 08/29/2016   Iliotibial band syndrome of right side 04/23/2016   Acromioclavicular arthrosis 06/30/2015   Left carpal tunnel syndrome 05/15/2015  Adhesive capsulitis of right shoulder 02/06/2015   Dysarthria due to cerebrovascular accident 01/09/2015   Hemiparesis affecting dominant side as late effect of cerebrovascular accident ([place]) 12/07/2014   Spastic neurogenic bladder 10/31/2014   Right rotator cuff tendonitis 10/19/2014   Stenosis of cervical spine region    Chronic right shoulder pain    Cerebral infarction due to thrombosis of left middle cerebral artery ([place])    Left pontine stroke ([place])    TIA (transient ischemic attack) 10/14/2014   Overactive bladder 10/14/2014   H/O: CVA (cerebrovascular accident) 10/14/2014   NECK PAIN, CHRONIC 10/29/2010   Essential hypertension 10/10/2009   INSOMNIA  09/15/2009   Pure hypercholesterolemia 11/28/2008   MENOPAUSAL SYNDROME 11/28/2008   Past Medical History:  Diagnosis Date   Cervical spine degeneration    C5/C6   Chronic right shoulder pain    Hemiparesis affecting dominant side as late effect of cerebrovascular accident ([place]) 12/07/2014   Hyperlipemia    Hypertension    Menopausal syndrome    Pamelia Hoit)   Overactive bladder    Statin intolerance     Family History  Problem Relation Age of Onset   Heart failure Father    Diabetes Mother    Hypertension Mother    Stroke Mother    Cirrhosis Sister    Diabetes Sister    Hyperlipidemia Brother    Hypertension Brother    Stroke Brother    Diabetes Brother     Past Surgical History:  Procedure Laterality Date   ABDOMINAL HYSTERECTOMY     1985   BAND HEMORRHOIDECTOMY     2010   CHOLECYSTECTOMY     1950   LUMBAR LAMINECTOMY Bilateral 08/03/2018   Dr. Octavio Manns, L3 laminotomy, L4-L5 laminectomy with neural foraminal decompression.   PAROTIDECTOMY     30 years ago   Social History   Occupational History   Occupation: retired  Tobacco Use   Smoking status: Never   Smokeless tobacco: Never  Substance and Sexual Activity   Alcohol use: No   Drug use: No   Sexual activity: Not on file

## 2021-11-28 ENCOUNTER — Telehealth: Payer: Self-pay | Admitting: Internal Medicine

## 2021-11-28 NOTE — Telephone Encounter (Signed)
Attempted PA for [facility]atha in [place] already on file for this request.  Authorization starting on 11/20/2021 and ending on 11/19/2022.

## 2021-11-29 NOTE — Telephone Encounter (Signed)
I spoke with patient. She is requesting a new referral to neurology for her daughter, one in the [facility] group. New referral placed to [facility].

## 2021-12-07 DIAGNOSIS — M25562 Pain in left knee: Secondary | ICD-10-CM | POA: Diagnosis not present

## 2021-12-07 DIAGNOSIS — M533 Sacrococcygeal disorders, not elsewhere classified: Secondary | ICD-10-CM | POA: Diagnosis not present

## 2021-12-07 DIAGNOSIS — G8929 Other chronic pain: Secondary | ICD-10-CM | POA: Diagnosis not present

## 2021-12-18 ENCOUNTER — Encounter: Payer: Self-pay | Admitting: Internal Medicine

## 2021-12-18 DIAGNOSIS — Z1231 Encounter for screening mammogram for malignant neoplasm of breast: Secondary | ICD-10-CM | POA: Diagnosis not present

## 2021-12-18 DIAGNOSIS — Z01419 Encounter for gynecological examination (general) (routine) without abnormal findings: Secondary | ICD-10-CM | POA: Diagnosis not present

## 2021-12-18 DIAGNOSIS — Z124 Encounter for screening for malignant neoplasm of cervix: Secondary | ICD-10-CM | POA: Diagnosis not present

## 2021-12-18 DIAGNOSIS — Z01411 Encounter for gynecological examination (general) (routine) with abnormal findings: Secondary | ICD-10-CM | POA: Diagnosis not present

## 2021-12-18 DIAGNOSIS — Z6822 Body mass index (BMI) 22.0-22.9, adult: Secondary | ICD-10-CM | POA: Diagnosis not present

## 2021-12-24 ENCOUNTER — Encounter: Payer: Self-pay | Admitting: Internal Medicine

## 2021-12-24 LAB — HM MAMMOGRAPHY

## 2021-12-25 DIAGNOSIS — N3941 Urge incontinence: Secondary | ICD-10-CM | POA: Diagnosis not present

## 2021-12-25 DIAGNOSIS — M545 Low back pain, unspecified: Secondary | ICD-10-CM | POA: Diagnosis not present

## 2021-12-25 DIAGNOSIS — Z791 Long term (current) use of non-steroidal anti-inflammatories (NSAID): Secondary | ICD-10-CM | POA: Diagnosis not present

## 2021-12-25 DIAGNOSIS — G8929 Other chronic pain: Secondary | ICD-10-CM | POA: Diagnosis not present

## 2021-12-25 DIAGNOSIS — N898 Other specified noninflammatory disorders of vagina: Secondary | ICD-10-CM | POA: Diagnosis not present

## 2021-12-25 DIAGNOSIS — I10 Essential (primary) hypertension: Secondary | ICD-10-CM | POA: Diagnosis not present

## 2021-12-25 DIAGNOSIS — M1990 Unspecified osteoarthritis, unspecified site: Secondary | ICD-10-CM | POA: Diagnosis not present

## 2021-12-25 DIAGNOSIS — E785 Hyperlipidemia, unspecified: Secondary | ICD-10-CM | POA: Diagnosis not present

## 2021-12-25 DIAGNOSIS — I69341 Monoplegia of lower limb following cerebral infarction affecting right dominant side: Secondary | ICD-10-CM | POA: Diagnosis not present

## 2022-01-24 ENCOUNTER — Ambulatory Visit: Payer: Medicare PPO | Admitting: Internal Medicine

## 2022-01-24 ENCOUNTER — Telehealth: Payer: Self-pay | Admitting: Internal Medicine

## 2022-01-24 ENCOUNTER — Encounter: Payer: Self-pay | Admitting: Internal Medicine

## 2022-01-24 VITALS — BP 120/84 | HR 82 | Temp 97.7°F | Wt 135.6 lb

## 2022-01-24 DIAGNOSIS — E7800 Pure hypercholesterolemia, unspecified: Secondary | ICD-10-CM

## 2022-01-24 DIAGNOSIS — M2560 Stiffness of unspecified joint, not elsewhere classified: Secondary | ICD-10-CM

## 2022-01-24 DIAGNOSIS — I10 Essential (primary) hypertension: Secondary | ICD-10-CM | POA: Diagnosis not present

## 2022-01-24 DIAGNOSIS — R739 Hyperglycemia, unspecified: Secondary | ICD-10-CM

## 2022-01-24 DIAGNOSIS — R7302 Impaired glucose tolerance (oral): Secondary | ICD-10-CM | POA: Diagnosis not present

## 2022-01-24 DIAGNOSIS — R5383 Other fatigue: Secondary | ICD-10-CM

## 2022-01-24 LAB — POCT GLYCOSYLATED HEMOGLOBIN (HGB A1C): Hemoglobin A1C: 5.9 % — AB (ref 4.0–5.6)

## 2022-01-24 LAB — LIPID PANEL
Cholesterol: 144 mg/dL (ref 0–200)
HDL: 51.1 mg/dL (ref >39.00)
LDL Cholesterol: 78 mg/dL (ref 0–99)
NonHDL: 93.38
Total CHOL/HDL Ratio: 3
Triglycerides: 78 mg/dL (ref 0.0–149.0)
VLDL: 15.6 mg/dL (ref 0.0–40.0)

## 2022-01-24 LAB — COMPREHENSIVE METABOLIC PANEL
ALT: 13 U/L (ref 0–35)
AST: 21 U/L (ref 0–37)
Albumin: 4.5 g/dL (ref 3.5–5.2)
Alkaline Phosphatase: 65 U/L (ref 39–117)
BUN: 16 mg/dL (ref 6–23)
CO2: 29 mEq/L (ref 19–32)
Calcium: 9.9 mg/dL (ref 8.4–10.5)
Chloride: 104 mEq/L (ref 96–112)
Creatinine, Ser: 0.88 mg/dL (ref 0.40–1.20)
GFR: 61.09 mL/min (ref >60.00)
Glucose, Bld: 92 mg/dL (ref 70–99)
Potassium: 4.4 mEq/L (ref 3.5–5.1)
Sodium: 140 mEq/L (ref 135–145)
Total Bilirubin: 0.3 mg/dL (ref 0.2–1.2)
Total Protein: 7.3 g/dL (ref 6.0–8.3)

## 2022-01-24 LAB — MAGNESIUM: Magnesium: 2.3 mg/dL (ref 1.5–2.5)

## 2022-01-24 NOTE — Progress Notes (Signed)
? ? ? ?Established Patient Office Visit ? ? ? ? ?This visit occurred during the SARS-CoV-2 public health emergency.  Safety protocols were in place, including screening questions prior to the visit, additional usage of staff PPE, and extensive cleaning of exam room while observing appropriate contact time as indicated for disinfecting solutions.  ? ? ?CC/Reason for Visit: 66-month follow-up chronic medical conditions ? ?HPI: Teresa Martinez is a 81 y.o. female who is coming in today for the above mentioned reasons. Past Medical History is significant for: Hypertension, hyperlipidemia, impaired glucose tolerance.  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  She had COVID infection in December and has felt like ever since she has had fatigue and decreased energy as well as stiffness of "all of my joints".  She wonders if she may have arthritis. ? ? ?Past Medical/Surgical History: ?Past Medical History:  ?Diagnosis Date  ? Cervical spine degeneration   ? C5/C6  ? Chronic right shoulder pain   ? Hemiparesis affecting dominant side as late effect of cerebrovascular accident ([place]) 12/07/2014  ? Hyperlipemia   ? Hypertension   ? Menopausal syndrome   ? Pamelia Hoit)  ? Overactive bladder   ? Statin intolerance   ? ? ?Past Surgical History:  ?Procedure Laterality Date  ? ABDOMINAL HYSTERECTOMY    ? 1985  ? BAND HEMORRHOIDECTOMY    ? 2010  ? CHOLECYSTECTOMY    ? 1950  ? LUMBAR LAMINECTOMY Bilateral 08/03/2018  ? Dr. Octavio Manns, L3 laminotomy, L4-L5 laminectomy with neural foraminal decompression.  ? PAROTIDECTOMY    ? 30 years ago  ? ? ?Social History: ? reports that she has never smoked. She has never used smokeless tobacco. She reports that she does not drink alcohol and does not use drugs. ? ?Allergies: ?Allergies  ?Allergen Reactions  ? Statins Other (See Comments)  ?  Muscle pain and unable to walk ?Muscle pain and unable to walk  ? Wasp Venom   ? Latex Other (See Comments)   ?  Burns her skin  ? ? ?Family History:  ?Family History  ?Problem Relation Age of Onset  ? Heart failure Father   ? Diabetes Mother   ? Hypertension Mother   ? Stroke Mother   ? Cirrhosis Sister   ? Diabetes Sister   ? Hyperlipidemia Brother   ? Hypertension Brother   ? Stroke Brother   ? Diabetes Brother   ? ? ? ?Current Outpatient Medications:  ?  aspirin EC 325 MG EC tablet, Take 1 tablet (325 mg total) by mouth daily., Disp: 100 tablet, Rfl: 1 ?  conjugated estrogens (PREMARIN) vaginal cream, Place 0.5 g vaginally., Disp: , Rfl:  ?  Evolocumab (REPATHA SURECLICK) XX123456 MG/ML SOAJ, Inject 1 Dose into the skin every 14 (fourteen) days., Disp: 2 mL, Rfl: 11 ?  ezetimibe (ZETIA) 10 MG tablet, TAKE 1 TABLET(10 MG) BY MOUTH DAILY, Disp: 90 tablet, Rfl: 1 ?  fesoterodine (TOVIAZ) 4 MG TB24 tablet, TAKE 1 TABLET(4 MG) BY MOUTH DAILY, Disp: 90 tablet, Rfl: 1 ?  lisinopril (ZESTRIL) 10 MG tablet, TAKE 1 TABLET(10 MG) BY MOUTH DAILY, Disp: 90 tablet, Rfl: 1 ? ?Review of Systems:  ?Constitutional: Denies fever, chills, diaphoresis, appetite change and fatigue.  ?HEENT: Denies photophobia, eye pain, redness, hearing loss, ear pain, congestion, sore throat, rhinorrhea, sneezing, mouth sores, trouble swallowing, neck pain, neck stiffness and tinnitus.   ?Respiratory: Denies SOB, DOE, cough, chest tightness,  and wheezing.   ?  Cardiovascular: Denies chest pain, palpitations and leg swelling.  ?Gastrointestinal: Denies nausea, vomiting, abdominal pain, diarrhea, constipation, blood in stool and abdominal distention.  ?Genitourinary: Denies dysuria, urgency, frequency, hematuria, flank pain and difficulty urinating.  ?Endocrine: Denies: hot or cold intolerance, sweats, changes in hair or nails, polyuria, polydipsia. ?Musculoskeletal: Denies back pain, joint swelling, arthralgias and gait problem.  ?Skin: Denies pallor, rash and wound.  ?Neurological: Denies dizziness, seizures, syncope, weakness, light-headedness, numbness and  headaches.  ?Hematological: Denies adenopathy. Easy bruising, personal or family bleeding history  ?Psychiatric/Behavioral: Denies suicidal ideation, mood changes, confusion, nervousness, sleep disturbance and agitation ? ? ? ?Physical Exam: ?Vitals:  ? 01/24/22 0839  ?BP: 120/84  ?Pulse: 82  ?Temp: 97.7 ?F (36.5 ?C)  ?TempSrc: Oral  ?SpO2: 99%  ?Weight: 135 lb 9.6 oz (61.5 kg)  ? ? ?Body mass index is 21.89 kg/m?. ? ? ?Constitutional: NAD, calm, comfortable ?Eyes: PERRL, lids and conjunctivae normal ?ENMT: Mucous membranes are moist.  ?Respiratory: clear to auscultation bilaterally, no wheezing, no crackles. Normal respiratory effort. No accessory muscle use.  ?Cardiovascular: Regular rate and rhythm, no murmurs / rubs / gallops. No extremity edema.  ?Neurologic: Grossly intact and nonfocal ?Psychiatric: Normal judgment and insight. Alert and oriented x 3. Normal mood.  ? ? ?Impression and Plan: ? ? ?IGT (impaired glucose tolerance) ?-A1c is stable at 5.9. ? ?Pure hypercholesterolemia  ?Currently on ezetimibe and evolocumab. ?- Plan: Lipid panel ? ?Essential hypertension ?-Blood pressures well controlled. ? ?Fatigue, unspecified type ?Joint stiffness  ?- Plan: Comprehensive metabolic panel, Magnesium ?-Suspect likely related to recent COVID infection, rule out electrolyte disturbances, question role of evolocumab. ? ?Time spent: 31 minutes reviewing chart, interviewing and examining patient and formulating plan of care. ? ? ?Patient Instructions  ?-Nice seeing you today!! ? ?-Lab work today; will notify you once results are available. ? ?-Remember your COVID booster and tdap at the pharmacy. ? ?-See you back in 6 months for your physical. ? ? ? ?Lelon Frohlich, MD ?[facility] at [facility] ? ? ?

## 2022-01-24 NOTE — Telephone Encounter (Signed)
Verified vaccines with patient  ?

## 2022-01-24 NOTE — Patient Instructions (Signed)
-  Nice seeing you today!! ? ?-Lab work today; will notify you once results are available. ? ?-Remember your COVID booster and tdap at the pharmacy. ? ?-See you back in 6 months for your physical. ?

## 2022-01-24 NOTE — Telephone Encounter (Signed)
Patient called in requesting a call from [facility] regarding a shot that she was supposed to get at the pharmacy. ? ?Please advise. ?

## 2022-02-04 ENCOUNTER — Ambulatory Visit: Payer: Medicare PPO | Admitting: Orthopaedic Surgery

## 2022-02-04 ENCOUNTER — Ambulatory Visit: Payer: Medicare PPO | Admitting: Physician Assistant

## 2022-02-04 ENCOUNTER — Encounter: Payer: Self-pay | Admitting: Physician Assistant

## 2022-02-04 VITALS — Ht 66.0 in | Wt 137.4 lb

## 2022-02-04 DIAGNOSIS — M1712 Unilateral primary osteoarthritis, left knee: Secondary | ICD-10-CM | POA: Diagnosis not present

## 2022-02-04 MED ORDER — LIDOCAINE HCL 1 % IJ SOLN
3.0000 mL | INTRAMUSCULAR | Status: AC | PRN
  Administered 2022-02-04: 3 mL

## 2022-02-04 MED ORDER — METHYLPREDNISOLONE ACETATE 40 MG/ML IJ SUSP
40.0000 mg | INTRAMUSCULAR | Status: AC | PRN
  Administered 2022-02-04: 40 mg via INTRA_ARTICULAR

## 2022-02-04 NOTE — Progress Notes (Signed)
? ?  Procedure Note ? ?Patient: Teresa Martinez             ?Date of Birth: 1941-09-07           ?MRN: [id number]             ?Visit Date: 02/04/2022 ?HPI: Mrs. Teresa Martinez comes in today requesting left knee injection.  She states she is not ready to schedule knee replacement as of yet.  She has had some swelling in the knee no new injury.  Last injection was in October 2023.  States the injection was helpful. ? ?Review of systems: See HPI otherwise negative or noncontributory. ? ?Left knee: Good range of motion no abnormal warmth erythema.  Positive effusion. ? ?Procedures: ?Visit Diagnoses:  ?1. Unilateral primary osteoarthritis, left knee   ? ? ?Large Joint Inj: L knee on 02/04/2022 1:50 PM ?Indications: pain ?Details: 22 G 1.5 in needle, superolateral approach ? ?Arthrogram: No ? ?Medications: 3 mL lidocaine 1 %; 40 mg methylPREDNISolone acetate 40 MG/ML ?Aspirate: 25 mL yellow and blood-tinged ?Outcome: tolerated well, no immediate complications ?Procedure, treatment alternatives, risks and benefits explained, specific risks discussed. Consent was given by the patient. Immediately prior to procedure a time out was called to verify the correct patient, procedure, equipment, support staff and site/side marked as required. Patient was prepped and draped in the usual sterile fashion.  ? ? ?Plan: ?Ace bandage was applied she will remove this this evening before going to bed.  She understands wait least 3 months between injections.  Questions were encouraged and answered.  Follow ? ? ?

## 2022-03-12 ENCOUNTER — Ambulatory Visit: Payer: Medicare PPO | Admitting: Internal Medicine

## 2022-03-12 ENCOUNTER — Encounter: Payer: Self-pay | Admitting: Internal Medicine

## 2022-03-12 VITALS — BP 120/84 | HR 90 | Temp 97.9°F | Wt 137.1 lb

## 2022-03-12 DIAGNOSIS — K219 Gastro-esophageal reflux disease without esophagitis: Secondary | ICD-10-CM

## 2022-03-12 MED ORDER — PANTOPRAZOLE SODIUM 40 MG PO TBEC: 40.0000 mg | DELAYED_RELEASE_TABLET | Freq: Every day | ORAL | 1 refills | Status: DC

## 2022-03-12 NOTE — Progress Notes (Signed)
? ? ? ?Established Patient Office Visit ? ? ? ? ?This visit occurred during the SARS-CoV-2 public health emergency.  Safety protocols were in place, including screening questions prior to the visit, additional usage of staff PPE, and extensive cleaning of exam room while observing appropriate contact time as indicated for disinfecting solutions.  ? ? ?CC/Reason for Visit: Stomach pains ? ?HPI: Teresa Martinez is a 81 y.o. female who is coming in today for the above mentioned reasons.  For the past few weeks she has been having epigastric pain that is burning in quality, increased burping after eating that is significant.  The pain has woken her up a few times during the night.  She thinks she might have an ulcer.  No vomiting although she has been a little nauseous. ? ?Past Medical/Surgical History: ?Past Medical History:  ?Diagnosis Date  ? Cervical spine degeneration   ? C5/C6  ? Chronic right shoulder pain   ? Hemiparesis affecting dominant side as late effect of cerebrovascular accident ([place]) 12/07/2014  ? Hyperlipemia   ? Hypertension   ? Menopausal syndrome   ? Pamelia Hoit)  ? Overactive bladder   ? Statin intolerance   ? ? ?Past Surgical History:  ?Procedure Laterality Date  ? ABDOMINAL HYSTERECTOMY    ? 1985  ? BAND HEMORRHOIDECTOMY    ? 2010  ? CHOLECYSTECTOMY    ? 1950  ? LUMBAR LAMINECTOMY Bilateral 08/03/2018  ? Dr. Octavio Manns, L3 laminotomy, L4-L5 laminectomy with neural foraminal decompression.  ? PAROTIDECTOMY    ? 30 years ago  ? ? ?Social History: ? reports that she has never smoked. She has never used smokeless tobacco. She reports that she does not drink alcohol and does not use drugs. ? ?Allergies: ?Allergies  ?Allergen Reactions  ? Statins Other (See Comments)  ?  Muscle pain and unable to walk ?Muscle pain and unable to walk  ? Wasp Venom   ? Latex Other (See Comments)  ?  Burns her skin  ? ? ?Family History:  ?Family History  ?Problem Relation Age of Onset  ? Heart failure Father    ? Diabetes Mother   ? Hypertension Mother   ? Stroke Mother   ? Cirrhosis Sister   ? Diabetes Sister   ? Hyperlipidemia Brother   ? Hypertension Brother   ? Stroke Brother   ? Diabetes Brother   ? ? ? ?Current Outpatient Medications:  ?  aspirin EC 325 MG EC tablet, Take 1 tablet (325 mg total) by mouth daily., Disp: 100 tablet, Rfl: 1 ?  conjugated estrogens (PREMARIN) vaginal cream, Place 0.5 g vaginally., Disp: , Rfl:  ?  Evolocumab (REPATHA SURECLICK) XX123456 MG/ML SOAJ, Inject 1 Dose into the skin every 14 (fourteen) days., Disp: 2 mL, Rfl: 11 ?  ezetimibe (ZETIA) 10 MG tablet, TAKE 1 TABLET(10 MG) BY MOUTH DAILY, Disp: 90 tablet, Rfl: 1 ?  fesoterodine (TOVIAZ) 4 MG TB24 tablet, TAKE 1 TABLET(4 MG) BY MOUTH DAILY, Disp: 90 tablet, Rfl: 1 ?  lisinopril (ZESTRIL) 10 MG tablet, TAKE 1 TABLET(10 MG) BY MOUTH DAILY, Disp: 90 tablet, Rfl: 1 ?  pantoprazole (PROTONIX) 40 MG tablet, Take 1 tablet (40 mg total) by mouth daily., Disp: 90 tablet, Rfl: 1 ? ?Review of Systems:  ?Constitutional: Denies fever, chills, diaphoresis and fatigue.  ?HEENT: Denies photophobia, eye pain, redness, hearing loss, ear pain, congestion, sore throat, rhinorrhea, sneezing, mouth sores, trouble swallowing, neck pain, neck stiffness and tinnitus.   ?Respiratory: Denies SOB, DOE,  cough, chest tightness,  and wheezing.   ?Cardiovascular: Denies chest pain, palpitations and leg swelling.  ?Gastrointestinal: Denies  diarrhea, constipation, blood in stool and abdominal distention.  ?Genitourinary: Denies dysuria, urgency, frequency, hematuria, flank pain and difficulty urinating.  ?Endocrine: Denies: hot or cold intolerance, sweats, changes in hair or nails, polyuria, polydipsia. ?Musculoskeletal: Denies myalgias, back pain, joint swelling, arthralgias and gait problem.  ?Skin: Denies pallor, rash and wound.  ?Neurological: Denies dizziness, seizures, syncope, weakness, light-headedness, numbness and headaches.  ?Hematological: Denies adenopathy.  Easy bruising, personal or family bleeding history  ?Psychiatric/Behavioral: Denies suicidal ideation, mood changes, confusion, nervousness, sleep disturbance and agitation ? ? ? ?Physical Exam: ?Vitals:  ? 03/12/22 1436  ?BP: 120/84  ?Pulse: 90  ?Temp: 97.9 ?F (36.6 ?C)  ?TempSrc: Oral  ?SpO2: 100%  ?Weight: 137 lb 1.6 oz (62.2 kg)  ? ? ?Body mass index is 22.13 kg/m?. ? ? ?Constitutional: NAD, calm, comfortable ?Eyes: PERRL, lids and conjunctivae normal ?ENMT: Mucous membranes are moist.  ?Psychiatric: Normal judgment and insight. Alert and oriented x 3. Normal mood.  ? ? ?Impression and Plan: ? ?Gastroesophageal reflux disease, unspecified whether esophagitis present  ?- Plan: pantoprazole (PROTONIX) 40 MG tablet ?-If no improvement with PPI therapy can consider GI referral. ? ? ? ?Time spent:30 minutes reviewing chart, interviewing and examining patient and formulating plan of care. ? ? ?Patient Instructions  ?-Nice seeing you today!! ? ?-Start Pantoprazole 40 mg daily. ? ? ? ?Lelon Frohlich, MD ?[facility] at [facility] ? ? ?

## 2022-03-12 NOTE — Patient Instructions (Signed)
-  Nice seeing you today!! ? ?-Start Pantoprazole 40 mg daily. ?

## 2022-03-18 DIAGNOSIS — M533 Sacrococcygeal disorders, not elsewhere classified: Secondary | ICD-10-CM | POA: Diagnosis not present

## 2022-03-25 ENCOUNTER — Telehealth: Payer: Self-pay | Admitting: Internal Medicine

## 2022-03-25 NOTE — Telephone Encounter (Signed)
Spoke to patient and the Protonix is working.  She still having some burping but less.  Patient does not qualify for colonoscopy due to age.  She would like to know if there is another way to "check her colon". ?

## 2022-03-25 NOTE — Telephone Encounter (Signed)
Pt requesting a call from nurse regarding the new medication. Was advised by provider to call in after 10 days.  ?

## 2022-04-22 ENCOUNTER — Other Ambulatory Visit (HOSPITAL_BASED_OUTPATIENT_CLINIC_OR_DEPARTMENT_OTHER): Payer: Self-pay | Admitting: Internal Medicine

## 2022-04-23 DIAGNOSIS — M533 Sacrococcygeal disorders, not elsewhere classified: Secondary | ICD-10-CM | POA: Diagnosis not present

## 2022-05-02 DIAGNOSIS — Z961 Presence of intraocular lens: Secondary | ICD-10-CM | POA: Diagnosis not present

## 2022-05-02 DIAGNOSIS — H04123 Dry eye syndrome of bilateral lacrimal glands: Secondary | ICD-10-CM | POA: Diagnosis not present

## 2022-05-10 ENCOUNTER — Ambulatory Visit: Payer: Medicare PPO | Admitting: Family

## 2022-05-10 VITALS — BP 130/90 | HR 57 | Temp 98.2°F | Wt 135.6 lb

## 2022-05-10 DIAGNOSIS — W010XXA Fall on same level from slipping, tripping and stumbling without subsequent striking against object, initial encounter: Secondary | ICD-10-CM

## 2022-05-10 DIAGNOSIS — S0181XA Laceration without foreign body of other part of head, initial encounter: Secondary | ICD-10-CM

## 2022-05-10 DIAGNOSIS — S0081XA Abrasion of other part of head, initial encounter: Secondary | ICD-10-CM

## 2022-05-13 NOTE — Progress Notes (Signed)
   Acute Office Visit  Subjective:     Patient ID: Teresa Martinez, female    DOB: [birth year], 81 y.o.   MRN: [id number]  Chief Complaint  Patient presents with   Fall    Pt reports she trip and fell and hit her head on Wednesday.L eye swells.     HPI Patient is in today for evaluation after sustaining a fall 2 days ago to concrete where she hit her head. She tripped over her feet. She reports having a small laceration to her left forehead, left eye blacked. She has a pmhx of CVA and walks with a cane. She also reports needing a knee replacement . Denies any loss of consciousness, blurred vision, double vision. She is statin intolerant.   Review of Systems  Constitutional: Negative.   Respiratory: Negative.    Cardiovascular: Negative.   Musculoskeletal:  Positive for falls.       Fall at home on the concrete  Neurological:  Negative for dizziness, tingling, focal weakness, loss of consciousness and headaches.  All other systems reviewed and are negative.      Objective:    BP 130/90 (BP Location: Right Arm, Patient Position: Sitting, Cuff Size: Normal)   Pulse (!) 57   Temp 98.2 F (36.8 C) (Oral)   Wt 135 lb 9.6 oz (61.5 kg)   SpO2 96%   BMI 21.89 kg/m    Physical Exam Vitals and nursing note reviewed.  Constitutional:      Appearance: Normal appearance.  HENT:     Head: Normocephalic. Left periorbital erythema and laceration present.      Comments: 1 cm laceration to the left forehead. Healing.   Black and blue area to the left eye. Moderate swelling.  Cardiovascular:     Rate and Rhythm: Normal rate and regular rhythm.  Pulmonary:     Effort: Pulmonary effort is normal.     Breath sounds: Normal breath sounds.  Musculoskeletal:        General: Normal range of motion.     Cervical back: Normal range of motion and neck supple.  Skin:    General: Skin is warm and dry.  Neurological:     Mental Status: She is alert.  Psychiatric:        Mood and  Affect: Mood normal.        Behavior: Behavior normal.   No results found for any visits on 05/10/22.      Assessment & Plan:   Problem List Items Addressed This Visit   None Visit Diagnoses     Fall from slip, trip, or stumble, initial encounter    -  Primary   Laceration of forehead, initial encounter       Abrasion, face w/o infection          Patient is stable. Likelihood of bleed 48 hours post fall is very low. No anticoagulants. Ice to the affected areas. Call if symptoms of lightheadedness, dizziness, blurred or double vision occur.  No orders of the defined types were placed in this encounter.   No follow-ups on file.  Eulis Foster, FNP

## 2022-05-20 ENCOUNTER — Other Ambulatory Visit: Payer: Self-pay | Admitting: Internal Medicine

## 2022-05-20 DIAGNOSIS — I10 Essential (primary) hypertension: Secondary | ICD-10-CM

## 2022-05-20 DIAGNOSIS — E7800 Pure hypercholesterolemia, unspecified: Secondary | ICD-10-CM

## 2022-05-24 DIAGNOSIS — M533 Sacrococcygeal disorders, not elsewhere classified: Secondary | ICD-10-CM | POA: Diagnosis not present

## 2022-05-29 ENCOUNTER — Ambulatory Visit: Payer: Medicare PPO | Admitting: Orthopaedic Surgery

## 2022-05-29 DIAGNOSIS — M25562 Pain in left knee: Secondary | ICD-10-CM

## 2022-05-29 DIAGNOSIS — M1712 Unilateral primary osteoarthritis, left knee: Secondary | ICD-10-CM | POA: Diagnosis not present

## 2022-05-29 DIAGNOSIS — G8929 Other chronic pain: Secondary | ICD-10-CM | POA: Diagnosis not present

## 2022-05-29 NOTE — Progress Notes (Signed)
The patient is a 81 year old female well-known to me.  She has well-documented arthritis of her left knee that is gotten more severe with time.  She has tried and failed conservative treatment for now well over a year with her left knee.  It is starting to cause her to have right-sided low back pain and right hip pain.  She has had previous spine surgery before by a spine surgeon in [place].  We have obtained x-rays of her knee in the past showing varus malalignment of the left knee with bone-on-bone wear the medial compartment and patellofemoral joint.  The x-rays were reviewed with her again showing tricompartment arthritis which is quite significant.  There is also osteophytes in all 3 compartments.  She says the knee does swell and it is hurting her on a daily basis.  At this point she does wish proceed with a knee replacement.  She reports that her only significant medical issue is high cholesterol.  She has a physical with her primary care physician in September and wants to consider knee replacement in October of this year.  She currently denies any headache, chest pain, shortness of breath, fever, chills, nausea, vomiting.  She is a thin individual and not a diabetic.  She does ambulate using a cane to offload that knee.  Examination of her left knee today shows a mild effusion.  There is varus malalignment that is correctable.  There is pain throughout the arc of motion of her knee especially involving the medial joint line and patellofemoral joint as well as crepitation.  I talked in detail about knee replacement surgery.  I discussed what the surgery involves and showed her knee replacement model.  I described the risk and benefits of the surgery and talked about what to expect from an intraoperative and postoperative course.  All questions and concerns were answered and addressed.  We will fill out a surgery scheduling sheet today for her having a knee replacement later in October per her  wishes.  If there is any issues before then she will let us know.

## 2022-07-04 ENCOUNTER — Other Ambulatory Visit: Payer: Self-pay | Admitting: Internal Medicine

## 2022-07-04 DIAGNOSIS — E7849 Other hyperlipidemia: Secondary | ICD-10-CM

## 2022-07-04 DIAGNOSIS — E7841 Elevated Lipoprotein(a): Secondary | ICD-10-CM

## 2022-07-09 ENCOUNTER — Other Ambulatory Visit: Payer: Self-pay

## 2022-07-30 ENCOUNTER — Ambulatory Visit (INDEPENDENT_AMBULATORY_CARE_PROVIDER_SITE_OTHER): Payer: Medicare PPO | Admitting: Internal Medicine

## 2022-07-30 ENCOUNTER — Encounter: Payer: Self-pay | Admitting: Internal Medicine

## 2022-07-30 VITALS — BP 124/78 | HR 88 | Temp 98.1°F | Ht 66.0 in | Wt 137.3 lb

## 2022-07-30 DIAGNOSIS — I10 Essential (primary) hypertension: Secondary | ICD-10-CM | POA: Diagnosis not present

## 2022-07-30 DIAGNOSIS — Z0000 Encounter for general adult medical examination without abnormal findings: Secondary | ICD-10-CM

## 2022-07-30 DIAGNOSIS — E7800 Pure hypercholesterolemia, unspecified: Secondary | ICD-10-CM | POA: Diagnosis not present

## 2022-07-30 DIAGNOSIS — G720 Drug-induced myopathy: Secondary | ICD-10-CM

## 2022-07-30 DIAGNOSIS — T466X5A Adverse effect of antihyperlipidemic and antiarteriosclerotic drugs, initial encounter: Secondary | ICD-10-CM | POA: Diagnosis not present

## 2022-07-30 DIAGNOSIS — Z780 Asymptomatic menopausal state: Secondary | ICD-10-CM

## 2022-07-30 DIAGNOSIS — Z13820 Encounter for screening for osteoporosis: Secondary | ICD-10-CM | POA: Diagnosis not present

## 2022-07-30 DIAGNOSIS — Z8673 Personal history of transient ischemic attack (TIA), and cerebral infarction without residual deficits: Secondary | ICD-10-CM | POA: Diagnosis not present

## 2022-07-30 DIAGNOSIS — R7302 Impaired glucose tolerance (oral): Secondary | ICD-10-CM

## 2022-07-30 LAB — CBC WITH DIFFERENTIAL/PLATELET
Basophils Absolute: 0 10*3/uL (ref 0.0–0.1)
Basophils Relative: 0.4 % (ref 0.0–3.0)
Eosinophils Absolute: 0.3 10*3/uL (ref 0.0–0.7)
Eosinophils Relative: 4.3 % (ref 0.0–5.0)
HCT: 41.8 % (ref 36.0–46.0)
Hemoglobin: 13.6 g/dL (ref 12.0–15.0)
Lymphocytes Relative: 30.7 % (ref 12.0–46.0)
Lymphs Abs: 1.9 10*3/uL (ref 0.7–4.0)
MCHC: 32.6 g/dL (ref 30.0–36.0)
MCV: 81.1 fl (ref 78.0–100.0)
Monocytes Absolute: 0.6 10*3/uL (ref 0.1–1.0)
Monocytes Relative: 9.2 % (ref 3.0–12.0)
Neutro Abs: 3.5 10*3/uL (ref 1.4–7.7)
Neutrophils Relative %: 55.4 % (ref 43.0–77.0)
Platelets: 235 10*3/uL (ref 150.0–400.0)
RBC: 5.16 Mil/uL — ABNORMAL HIGH (ref 3.87–5.11)
RDW: 14.4 % (ref 11.5–15.5)
WBC: 6.3 10*3/uL (ref 4.0–10.5)

## 2022-07-30 LAB — LIPID PANEL
Cholesterol: 155 mg/dL (ref 0–200)
HDL: 52.2 mg/dL (ref >39.00)
LDL Cholesterol: 81 mg/dL (ref 0–99)
NonHDL: 103.18
Total CHOL/HDL Ratio: 3
Triglycerides: 110 mg/dL (ref 0.0–149.0)
VLDL: 22 mg/dL (ref 0.0–40.0)

## 2022-07-30 LAB — COMPREHENSIVE METABOLIC PANEL
ALT: 11 U/L (ref 0–35)
AST: 17 U/L (ref 0–37)
Albumin: 4.2 g/dL (ref 3.5–5.2)
Alkaline Phosphatase: 70 U/L (ref 39–117)
BUN: 16 mg/dL (ref 6–23)
CO2: 28 mEq/L (ref 19–32)
Calcium: 9.4 mg/dL (ref 8.4–10.5)
Chloride: 105 mEq/L (ref 96–112)
Creatinine, Ser: 0.87 mg/dL (ref 0.40–1.20)
GFR: 61.71 mL/min (ref >60.00)
Glucose, Bld: 88 mg/dL (ref 70–99)
Potassium: 4.1 mEq/L (ref 3.5–5.1)
Sodium: 142 mEq/L (ref 135–145)
Total Bilirubin: 0.4 mg/dL (ref 0.2–1.2)
Total Protein: 7.1 g/dL (ref 6.0–8.3)

## 2022-07-30 LAB — HEMOGLOBIN A1C: Hgb A1c MFr Bld: 6.2 % (ref 4.6–6.5)

## 2022-07-30 LAB — TSH: TSH: 0.85 u[IU]/mL (ref 0.35–5.50)

## 2022-07-30 LAB — VITAMIN D 25 HYDROXY (VIT D DEFICIENCY, FRACTURES): VITD: 53.86 ng/mL (ref 30.00–100.00)

## 2022-07-30 LAB — VITAMIN B12: Vitamin B-12: 743 pg/mL (ref 211–911)

## 2022-07-30 NOTE — Progress Notes (Signed)
Established Patient Office Visit     CC/Reason for Visit: Annual preventive exam and subsequent Medicare wellness visit  HPI: Teresa Martinez is a 81 y.o. female who is coming in today for the above mentioned reasons. Past Medical History is significant for: Hypertension, hyperlipidemia, impaired glucose tolerance.  She suffered a fall in July that she attributes to leg pain from the evolocumab and decided to discontinue.  She does not see Dr. Rennis Golden until November to discuss this.  She tried calling and could not get an earlier appointment.  She will be having a left total knee replacement on October 24.  She is due for COVID, flu vaccines.  She had a mammogram in February.  She has routine eye and dental care.  She has no perceived hearing difficulty.  Blood pressure is noted to be high in office, however recent measurement at home was 124/78.   Past Medical/Surgical History: Past Medical History:  Diagnosis Date   Cervical spine degeneration    C5/C6   Chronic right shoulder pain    Hemiparesis affecting dominant side as late effect of cerebrovascular accident (HCC) 12/07/2014   Hyperlipemia    Hypertension    Menopausal syndrome    Floyde Parkins)   Overactive bladder    Statin intolerance     Past Surgical History:  Procedure Laterality Date   ABDOMINAL HYSTERECTOMY     1985   BAND HEMORRHOIDECTOMY     2010   CHOLECYSTECTOMY     1950   LUMBAR LAMINECTOMY Bilateral 08/03/2018   Dr. Christain Sacramento, L3 laminotomy, L4-L5 laminectomy with neural foraminal decompression.   PAROTIDECTOMY     30 years ago    Social History:  reports that she has never smoked. She has never used smokeless tobacco. She reports that she does not drink alcohol and does not use drugs.  Allergies: Allergies  Allergen Reactions   Statins Other (See Comments)    Muscle pain and unable to walk Muscle pain and unable to walk   Wasp Venom    Latex Other (See Comments)    Burns her skin     Family History:  Family History  Problem Relation Age of Onset   Heart failure Father    Diabetes Mother    Hypertension Mother    Stroke Mother    Cirrhosis Sister    Diabetes Sister    Hyperlipidemia Brother    Hypertension Brother    Stroke Brother    Diabetes Brother      Current Outpatient Medications:    aspirin EC 325 MG EC tablet, Take 1 tablet (325 mg total) by mouth daily., Disp: 100 tablet, Rfl: 1   conjugated estrogens (PREMARIN) vaginal cream, Place 0.5 g vaginally., Disp: , Rfl:    ezetimibe (ZETIA) 10 MG tablet, TAKE 1 TABLET(10 MG) BY MOUTH DAILY, Disp: 90 tablet, Rfl: 1   fesoterodine (TOVIAZ) 4 MG TB24 tablet, TAKE 1 TABLET(4 MG) BY MOUTH DAILY, Disp: 90 tablet, Rfl: 1   lisinopril (ZESTRIL) 10 MG tablet, TAKE 1 TABLET(10 MG) BY MOUTH DAILY, Disp: 90 tablet, Rfl: 1   pantoprazole (PROTONIX) 40 MG tablet, Take 1 tablet (40 mg total) by mouth daily., Disp: 90 tablet, Rfl: 1   REPATHA SURECLICK 140 MG/ML SOAJ, INJECT 1 DOSE INTO SKIN EVERY 14(FOURTEEN) DAYS, Disp: 2 mL, Rfl: 11  Review of Systems:  Constitutional: Denies fever, chills, diaphoresis, appetite change and fatigue.  HEENT: Denies photophobia, eye pain, redness, hearing loss, ear pain, congestion, sore  throat, rhinorrhea, sneezing, mouth sores, trouble swallowing, neck pain, neck stiffness and tinnitus.   Respiratory: Denies SOB, DOE, cough, chest tightness,  and wheezing.   Cardiovascular: Denies chest pain, palpitations and leg swelling.  Gastrointestinal: Denies nausea, vomiting, abdominal pain, diarrhea, constipation, blood in stool and abdominal distention.  Genitourinary: Denies dysuria, urgency, frequency, hematuria, flank pain and difficulty urinating.  Endocrine: Denies: hot or cold intolerance, sweats, changes in hair or nails, polyuria, polydipsia. Musculoskeletal: Positive for myalgias, back pain, joint swelling, arthralgias and gait problem.  Skin: Denies pallor, rash and wound.   Neurological: Denies dizziness, seizures, syncope, weakness, light-headedness, numbness and headaches.  Hematological: Denies adenopathy. Easy bruising, personal or family bleeding history  Psychiatric/Behavioral: Denies suicidal ideation, mood changes, confusion, nervousness, sleep disturbance and agitation    Physical Exam: Vitals:   07/30/22 0839 07/30/22 0845 07/30/22 0907  BP: (!) 140/90 (!) 162/99 124/78  Pulse: 88    Temp: 98.1 F (36.7 C)    TempSrc: Oral    SpO2: 98%    Weight: 137 lb 4.8 oz (62.3 kg)    Height: 5\' 6"  (1.676 m)      Body mass index is 22.16 kg/m.    Constitutional: NAD, calm, comfortable, ambulates with a cane Eyes: PERRL, lids and conjunctivae normal, wears corrective lenses ENMT: Mucous membranes are moist. Posterior pharynx clear of any exudate or lesions. Normal dentition. Tympanic membrane is pearly white, no erythema or bulging. Neck: normal, supple, no masses, no thyromegaly Respiratory: clear to auscultation bilaterally, no wheezing, no crackles. Normal respiratory effort. No accessory muscle use.  Cardiovascular: Regular rate and rhythm, no murmurs / rubs / gallops. No extremity edema. 2+ pedal pulses. No carotid bruits.  Abdomen: no tenderness, no masses palpated. No hepatosplenomegaly. Bowel sounds positive.  Musculoskeletal: no clubbing / cyanosis. No joint deformity upper and lower extremities. Good ROM, no contractures. Normal muscle tone.  Skin: no rashes, lesions, ulcers. No induration Neurologic: CN 2-12 grossly intact. Sensation intact, DTR normal. Strength 5/5 in all 4.  Psychiatric: Normal judgment and insight. Alert and oriented x 3. Normal mood.    Subsequent Medicare wellness visit   1. Risk factors, based on past  M,S,F -cardiovascular disease risk factors include age, history of hypertension, history of hyperlipidemia, prior CVA   2.  Physical activities: Stays busy with activities of daily living   3.  Depression/mood:  Stable, not depressed   4.  Hearing: No perceived issues   5.  ADL's: Independent in all ADLs   6.  Fall risk: Low fall risk   7.  Home safety: No problems identified   8.  Height weight, and visual acuity: height and weight as above, vision:  Vision Screening   Right eye Left eye Both eyes  Without correction     With correction 20/20 20/20 20/20      9.  Counseling: Advised to consider restarting evolocumab, she will do ambulatory blood pressure monitoring and follow-up in 3 months.   10. Lab orders based on risk factors: Laboratory update will be reviewed   11. Referral : None today   12. Care plan: Follow-up with me in 3 months   13. Cognitive assessment: No cognitive impairment   14. Screening: Patient provided with a written and personalized 5-10 year screening schedule in the AVS. yes   15. Provider List Update: PCP, cardiology Dr. , Ortho  Dr.  16. Advance Directives: Full code   17. Opioids: Patient is not on any opioid prescriptions and has  no risk factors for a substance use disorder.   Flowsheet Row Office Visit from 05/10/2022 in [facility] [place]  PHQ-9 Total Score 2          01/26/2021   10:00 AM 07/27/2021    9:52 AM 11/17/2021   11:23 AM 03/12/2022    2:41 PM 05/10/2022    4:46 PM  Fall Risk  Falls in the past year? 0 1 0 0 1  Number of falls in past year - Comments     Pt trip and fell  Was there an injury with Fall? 0   0 1  Fall Risk Category Calculator 0   0 3  Fall Risk Category Low   Low High  Patient Fall Risk Level Low fall risk   Low fall risk   Patient at Risk for Falls Due to No Fall Risks   No Fall Risks Other (Comment)  Fall risk Follow up Falls evaluation completed   Falls evaluation completed Falls evaluation completed     Impression and Plan:  Encounter for preventive health examination  IGT (impaired glucose tolerance) - Plan: Hemoglobin A1c, Hemoglobin A1c  Pure hypercholesterolemia - Plan:  Lipid panel, Lipid panel  Essential hypertension - Plan: CBC with Differential/Platelet, Comprehensive metabolic panel, Comprehensive metabolic panel, CBC with Differential/Platelet  H/O: CVA (cerebrovascular accident)  Statin myopathy - Plan: TSH, Vitamin B12, VITAMIN D 25 Hydroxy (Vit-D Deficiency, Fractures), VITAMIN D 25 Hydroxy (Vit-D Deficiency, Fractures), Vitamin B12, TSH   -Recommend routine eye and dental care. -Immunizations: Due for COVID, flu vaccines declines today. -Healthy lifestyle discussed in detail. -Labs to be updated today. -Colon cancer screening: No further due to age -Breast cancer screening: 12/2021 -Cervical cancer screening: No further due to age -Lung cancer screening: Not applicable -Prostate cancer screening: Not applicable -DEXA: Requested    Chaya Jan, MD [facility]

## 2022-07-30 NOTE — Addendum Note (Signed)
Addended by: Kern Reap B on: 07/30/2022 10:08 AM   Modules accepted: Orders

## 2022-07-31 ENCOUNTER — Telehealth: Payer: Self-pay | Admitting: Orthopaedic Surgery

## 2022-07-31 NOTE — Telephone Encounter (Signed)
Patient called in stating her PCP called her and let her know she has not heard anything from [facility] about the surgery and if he would please update her that would be appreciated its Dr. Philip Aspen, [place] Patricia

## 2022-08-02 ENCOUNTER — Telehealth: Payer: Self-pay | Admitting: Internal Medicine

## 2022-08-02 NOTE — Telephone Encounter (Signed)
Pt is calling and would like blood work results 

## 2022-08-05 NOTE — Telephone Encounter (Signed)
I called patient and left voice mail for return call. °

## 2022-08-08 ENCOUNTER — Telehealth: Payer: Self-pay | Admitting: Orthopaedic Surgery

## 2022-08-08 NOTE — Telephone Encounter (Signed)
Received medical records release form,$25.00 check and FMLA paperwork from  patient/Forwarding to [employer] today 

## 2022-08-27 ENCOUNTER — Other Ambulatory Visit: Payer: Self-pay | Admitting: Physician Assistant

## 2022-09-02 NOTE — Pre-Procedure Instructions (Signed)
Surgical Instructions    Your procedure is scheduled on September 10, 2022.  Report to [facility] Main Entrance "A" at 10:00 A.M., then check in with the Admitting office.  Call this number if you have problems the morning of surgery:  [phone]   If you have any questions prior to your surgery date call [phone]: Open Monday-Friday 8am-4pm    Remember:  Do not eat after midnight the night before your surgery  You may drink clear liquids until 9:00 AM the morning of your surgery.   Clear liquids allowed are: Water, Non-Citrus Juices (without pulp), Carbonated Beverages, Clear Tea, Black Coffee Only (NO MILK, CREAM OR POWDERED CREAMER of any kind), and Gatorade.  Patient Instructions  The night before surgery:  No food after midnight. ONLY clear liquids after midnight  The day of surgery (if you do NOT have diabetes):  Drink ONE (1) Pre-Surgery Clear Ensure by 9:00 AM the morning of surgery. Drink in one sitting. Do not sip.  This drink was given to you during your hospital  pre-op appointment visit.  Nothing else to drink after completing the  Pre-Surgery Clear Ensure.         If you have questions, please contact your surgeon's office.      Take these medicines the morning of surgery with A SIP OF WATER:  ezetimibe (ZETIA)  pantoprazole (PROTONIX)  fesoterodine (TOVIAZ)     Follow your surgeon's instructions on when to stop Aspirin.  If no instructions were given by your surgeon then you will need to call the office to get those instructions.     As of today, STOP taking any Aleve, Naproxen, Ibuprofen, Motrin, Advil, Goody's, BC's, all herbal medications, fish oil, and all vitamins.                     Do NOT Smoke (Tobacco/Vaping) for 24 hours prior to your procedure.  If you use a CPAP at night, you may bring your mask/headgear for your overnight stay.   Contacts, glasses, piercing's, hearing aid's, dentures or partials may not be worn into surgery, please  bring cases for these belongings.    For patients admitted to the hospital, discharge time will be determined by your treatment team.   Patients discharged the day of surgery will not be allowed to drive home, and someone needs to stay with them for 24 hours.  SURGICAL WAITING ROOM VISITATION Patients having surgery or a procedure may have no more than 2 support people in the waiting area - these visitors may rotate.   Children under the age of 38 must have an adult with them who is not the patient. If the patient needs to stay at the hospital during part of their recovery, the visitor guidelines for inpatient rooms apply. Pre-op nurse will coordinate an appropriate time for 1 support person to accompany patient in pre-op.  This support person may not rotate.   Please refer to the [facility] website for the visitor guidelines for Inpatients (after your surgery is over and you are in a regular room).    Special instructions:   [place]- Preparing For Surgery  Before surgery, you can play an important role. Because skin is not sterile, your skin needs to be as free of germs as possible. You can reduce the number of germs on your skin by washing with CHG (chlorahexidine gluconate) Soap before surgery.  CHG is an antiseptic cleaner which kills germs and bonds with the skin to  continue killing germs even after washing.    Oral Hygiene is also important to reduce your risk of infection.  Remember - BRUSH YOUR TEETH THE MORNING OF SURGERY WITH YOUR REGULAR TOOTHPASTE  Please do not use if you have an allergy to CHG or antibacterial soaps. If your skin becomes reddened/irritated stop using the CHG.  Do not shave (including legs and underarms) for at least 48 hours prior to first CHG shower. It is OK to shave your face.  Please follow these instructions carefully.   Shower the NIGHT BEFORE SURGERY and the MORNING OF SURGERY  If you chose to wash your hair, wash your hair first as usual with  your normal shampoo.  After you shampoo, rinse your hair and body thoroughly to remove the shampoo.  Use CHG Soap as you would any other liquid soap. You can apply CHG directly to the skin and wash gently with a scrungie or a clean washcloth.   Apply the CHG Soap to your body ONLY FROM THE NECK DOWN.  Do not use on open wounds or open sores. Avoid contact with your eyes, ears, mouth and genitals (private parts). Wash Face and genitals (private parts)  with your normal soap.   Wash thoroughly, paying special attention to the area where your surgery will be performed.  Thoroughly rinse your body with warm water from the neck down.  DO NOT shower/wash with your normal soap after using and rinsing off the CHG Soap.  Pat yourself dry with a CLEAN TOWEL.  Wear CLEAN PAJAMAS to bed the night before surgery  Place CLEAN SHEETS on your bed the night before your surgery  DO NOT SLEEP WITH PETS.   Day of Surgery: Take a shower with CHG soap. Do not wear jewelry or makeup Do not wear lotions, powders, perfumes/colognes, or deodorant. Do not shave 48 hours prior to surgery.  Men may shave face and neck. Do not bring valuables to the hospital.  [facility] is not responsible for any belongings or valuables. Do not wear nail polish, gel polish, artificial nails, or any other type of covering on natural nails (fingers and toes) If you have artificial nails or gel coating that need to be removed by a nail salon, please have this removed prior to surgery. Artificial nails or gel coating may interfere with anesthesia's ability to adequately monitor your vital signs.  Wear Clean/Comfortable clothing the morning of surgery Remember to brush your teeth WITH YOUR REGULAR TOOTHPASTE.   Please read over the following fact sheets that you were given.    If you received a COVID test during your pre-op visit  it is requested that you wear a mask when out in public, stay away from anyone that may not be  feeling well and notify your surgeon if you develop symptoms. If you have been in contact with anyone that has tested positive in the last 10 days please notify you surgeon.

## 2022-09-03 ENCOUNTER — Encounter (HOSPITAL_COMMUNITY)
Admission: RE | Admit: 2022-09-03 | Discharge: 2022-09-03 | Disposition: A | Discharge status: Home / Self Care | Payer: Medicare PPO | Source: Ambulatory Visit | Attending: Orthopaedic Surgery | Admitting: Orthopaedic Surgery

## 2022-09-03 ENCOUNTER — Encounter (HOSPITAL_COMMUNITY): Payer: Self-pay

## 2022-09-03 ENCOUNTER — Telehealth: Payer: Self-pay | Admitting: Orthopaedic Surgery

## 2022-09-03 ENCOUNTER — Other Ambulatory Visit: Payer: Self-pay

## 2022-09-03 VITALS — BP 146/90 | HR 89 | Temp 98.2°F | Resp 18 | Ht 66.0 in | Wt 138.6 lb

## 2022-09-03 DIAGNOSIS — I10 Essential (primary) hypertension: Secondary | ICD-10-CM | POA: Diagnosis not present

## 2022-09-03 DIAGNOSIS — Z01818 Encounter for other preprocedural examination: Secondary | ICD-10-CM | POA: Insufficient documentation

## 2022-09-03 HISTORY — DX: Gastro-esophageal reflux disease without esophagitis: K21.9

## 2022-09-03 LAB — BASIC METABOLIC PANEL
Anion gap: 8 (ref 5–15)
BUN: 17 mg/dL (ref 8–23)
CO2: 28 mmol/L (ref 22–32)
Calcium: 9.6 mg/dL (ref 8.9–10.3)
Chloride: 106 mmol/L (ref 98–111)
Creatinine, Ser: 0.88 mg/dL (ref 0.44–1.00)
GFR, Estimated: >60 mL/min (ref >60)
Glucose, Bld: 95 mg/dL (ref 70–99)
Potassium: 3.8 mmol/L (ref 3.5–5.1)
Sodium: 142 mmol/L (ref 135–145)

## 2022-09-03 LAB — CBC
HCT: 43.5 % (ref 36.0–46.0)
Hemoglobin: 14 g/dL (ref 12.0–15.0)
MCH: 26.5 pg (ref 26.0–34.0)
MCHC: 32.2 g/dL (ref 30.0–36.0)
MCV: 82.4 fL (ref 80.0–100.0)
Platelets: 267 10*3/uL (ref 150–400)
RBC: 5.28 MIL/uL — ABNORMAL HIGH (ref 3.87–5.11)
RDW: 14.1 % (ref 11.5–15.5)
WBC: 7.1 10*3/uL (ref 4.0–10.5)
nRBC: 0 % (ref 0.0–0.2)

## 2022-09-03 LAB — SURGICAL PCR SCREEN
MRSA, PCR: NEGATIVE
Staphylococcus aureus: NEGATIVE

## 2022-09-03 NOTE — Telephone Encounter (Signed)
LMOM for patient that note is at front desk

## 2022-09-03 NOTE — Progress Notes (Signed)
PCP - Dr.Estela Isaac Bliss Cardiologist - Dr.Hilty  PPM/ICD - denies Device Orders - n/a Rep Notified - n/a  Chest x-ray - n/a EKG - 09/03/22 Stress Test - denies ECHO - 10/15/2014 Cardiac Cath - denies  Sleep Study - 08/14/2010, negative  CPAP - denies  DM-pt denies Blood Thinner Instructions:denies Aspirin Instructions:pt states she already stopped taking ASA.   ERAS Protcol -yes PRE-SURGERY Ensure -given at PAT.  COVID TEST- n/a   Anesthesia review: yes, cardiac hx.   Patient denies shortness of breath, fever, cough and chest pain at PAT appointment   All instructions explained to the patient, with a verbal understanding of the material. Patient agrees to go over the instructions while at home for a better understanding. Patient also instructed to self quarantine after being tested for COVID-19. The opportunity to ask questions was provided.

## 2022-09-03 NOTE — Telephone Encounter (Signed)
Patient called. Says her daughter that will be taking care of her after surgery, got jury duty the date of the surgery. She needs a letter stating that mom is having surgery 10/24 and she is the primary care taker for her. Selena Batten is her daughter's name. [facility] duty TIR#[phone] should be on the letter. Would like to come by and pick it up today. Call back number is [phone]

## 2022-09-10 ENCOUNTER — Ambulatory Visit (HOSPITAL_COMMUNITY): Payer: Medicare PPO | Admitting: Certified Registered"

## 2022-09-10 ENCOUNTER — Observation Stay (HOSPITAL_COMMUNITY): Payer: Medicare PPO

## 2022-09-10 ENCOUNTER — Telehealth: Payer: Self-pay | Admitting: *Deleted

## 2022-09-10 ENCOUNTER — Encounter (HOSPITAL_COMMUNITY): Payer: Self-pay | Admitting: Orthopaedic Surgery

## 2022-09-10 ENCOUNTER — Encounter (HOSPITAL_COMMUNITY)
Admission: RE | Disposition: A | Discharge status: Home / Self Care | Payer: Self-pay | Source: Home / Self Care | Attending: Orthopaedic Surgery

## 2022-09-10 ENCOUNTER — Ambulatory Visit (HOSPITAL_BASED_OUTPATIENT_CLINIC_OR_DEPARTMENT_OTHER): Payer: Medicare PPO | Admitting: Certified Registered"

## 2022-09-10 ENCOUNTER — Other Ambulatory Visit: Payer: Self-pay

## 2022-09-10 ENCOUNTER — Observation Stay (HOSPITAL_COMMUNITY)
Admission: RE | Admit: 2022-09-10 | Discharge: 2022-09-12 | Disposition: A | Discharge status: Home / Self Care | Payer: Medicare PPO | Attending: Orthopaedic Surgery | Admitting: Orthopaedic Surgery

## 2022-09-10 DIAGNOSIS — I10 Essential (primary) hypertension: Secondary | ICD-10-CM

## 2022-09-10 DIAGNOSIS — M1712 Unilateral primary osteoarthritis, left knee: Principal | ICD-10-CM

## 2022-09-10 DIAGNOSIS — Z96652 Presence of left artificial knee joint: Secondary | ICD-10-CM

## 2022-09-10 DIAGNOSIS — Z471 Aftercare following joint replacement surgery: Secondary | ICD-10-CM | POA: Diagnosis not present

## 2022-09-10 DIAGNOSIS — G8918 Other acute postprocedural pain: Secondary | ICD-10-CM | POA: Diagnosis not present

## 2022-09-10 DIAGNOSIS — Z8673 Personal history of transient ischemic attack (TIA), and cerebral infarction without residual deficits: Secondary | ICD-10-CM | POA: Diagnosis not present

## 2022-09-10 HISTORY — PX: TOTAL KNEE ARTHROPLASTY: SHX125

## 2022-09-10 SURGERY — ARTHROPLASTY, KNEE, TOTAL
Anesthesia: Regional | Site: Knee | Laterality: Left

## 2022-09-10 MED ORDER — ASPIRIN 81 MG PO CHEW
81.0000 mg | CHEWABLE_TABLET | Freq: Two times a day (BID) | ORAL | Status: DC
  Administered 2022-09-10 – 2022-09-12 (×4): 81 mg via ORAL
  Filled 2022-09-10 (×4): qty 1

## 2022-09-10 MED ORDER — HYDROMORPHONE HCL 1 MG/ML IJ SOLN: 0.2500 mg | INTRAMUSCULAR | Status: DC | PRN

## 2022-09-10 MED ORDER — PHENOL 1.4 % MT LIQD
1.0000 | OROMUCOSAL | Status: DC | PRN
  Filled 2022-09-10: qty 177

## 2022-09-10 MED ORDER — FENTANYL CITRATE (PF) 250 MCG/5ML IJ SOLN
INTRAMUSCULAR | Status: DC | PRN
  Administered 2022-09-10: 50 ug via INTRAVENOUS

## 2022-09-10 MED ORDER — CEFAZOLIN SODIUM-DEXTROSE 1-4 GM/50ML-% IV SOLN
1.0000 g | Freq: Four times a day (QID) | INTRAVENOUS | Status: AC
  Administered 2022-09-10 – 2022-09-11 (×2): 1 g via INTRAVENOUS
  Filled 2022-09-10 (×2): qty 50

## 2022-09-10 MED ORDER — SODIUM CHLORIDE 0.9 % IV SOLN: INTRAVENOUS | Status: DC

## 2022-09-10 MED ORDER — ONDANSETRON HCL 4 MG PO TABS: 4.0000 mg | ORAL_TABLET | Freq: Four times a day (QID) | ORAL | Status: DC | PRN

## 2022-09-10 MED ORDER — FENTANYL CITRATE (PF) 100 MCG/2ML IJ SOLN
INTRAMUSCULAR | Status: AC
  Administered 2022-09-10: 50 ug via INTRAVENOUS
  Filled 2022-09-10: qty 2

## 2022-09-10 MED ORDER — BUPIVACAINE IN DEXTROSE 0.75-8.25 % IT SOLN
INTRATHECAL | Status: DC | PRN
  Administered 2022-09-10: 2 mL via INTRATHECAL

## 2022-09-10 MED ORDER — OXYCODONE HCL 5 MG/5ML PO SOLN: 5.0000 mg | Freq: Once | ORAL | Status: DC | PRN

## 2022-09-10 MED ORDER — PANTOPRAZOLE SODIUM 40 MG PO TBEC
40.0000 mg | DELAYED_RELEASE_TABLET | Freq: Every day | ORAL | Status: DC
  Administered 2022-09-11: 40 mg via ORAL
  Filled 2022-09-10: qty 1

## 2022-09-10 MED ORDER — ADULT MULTIVITAMIN W/MINERALS CH
1.0000 | ORAL_TABLET | Freq: Every day | ORAL | Status: DC
  Administered 2022-09-10 – 2022-09-12 (×3): 1 via ORAL
  Filled 2022-09-10 (×3): qty 1

## 2022-09-10 MED ORDER — ORAL CARE MOUTH RINSE: 15.0000 mL | Freq: Once | OROMUCOSAL | Status: AC

## 2022-09-10 MED ORDER — TRANEXAMIC ACID-NACL 1000-0.7 MG/100ML-% IV SOLN
1000.0000 mg | INTRAVENOUS | Status: AC
  Administered 2022-09-10: 1000 mg via INTRAVENOUS
  Filled 2022-09-10: qty 100

## 2022-09-10 MED ORDER — MENTHOL 3 MG MT LOZG
1.0000 | LOZENGE | OROMUCOSAL | Status: DC | PRN
  Administered 2022-09-11: 3 mg via ORAL

## 2022-09-10 MED ORDER — METHOCARBAMOL 1000 MG/10ML IJ SOLN: 500.0000 mg | Freq: Four times a day (QID) | INTRAVENOUS | Status: DC | PRN

## 2022-09-10 MED ORDER — 0.9 % SODIUM CHLORIDE (POUR BTL) OPTIME
TOPICAL | Status: DC | PRN
  Administered 2022-09-10: 1000 mL

## 2022-09-10 MED ORDER — ACETAMINOPHEN 325 MG PO TABS
325.0000 mg | ORAL_TABLET | Freq: Four times a day (QID) | ORAL | Status: DC | PRN
  Administered 2022-09-10 – 2022-09-12 (×4): 650 mg via ORAL
  Filled 2022-09-10 (×4): qty 2

## 2022-09-10 MED ORDER — PROPOFOL 500 MG/50ML IV EMUL
INTRAVENOUS | Status: DC | PRN
  Administered 2022-09-10: 75 ug/kg/min via INTRAVENOUS

## 2022-09-10 MED ORDER — LIDOCAINE 2% (20 MG/ML) 5 ML SYRINGE
INTRAMUSCULAR | Status: DC | PRN
  Administered 2022-09-10: 30 mg via INTRAVENOUS

## 2022-09-10 MED ORDER — POLYETHYLENE GLYCOL 3350 17 G PO PACK: 17.0000 g | PACK | Freq: Every day | ORAL | Status: DC | PRN

## 2022-09-10 MED ORDER — CEFAZOLIN SODIUM-DEXTROSE 2-4 GM/100ML-% IV SOLN
2.0000 g | INTRAVENOUS | Status: AC
  Administered 2022-09-10: 2 g via INTRAVENOUS
  Filled 2022-09-10: qty 100

## 2022-09-10 MED ORDER — FESOTERODINE FUMARATE ER 4 MG PO TB24
4.0000 mg | ORAL_TABLET | Freq: Every day | ORAL | Status: DC
  Administered 2022-09-11 – 2022-09-12 (×2): 4 mg via ORAL
  Filled 2022-09-10 (×3): qty 1

## 2022-09-10 MED ORDER — SODIUM CHLORIDE 0.9 % IR SOLN
Status: DC | PRN
  Administered 2022-09-10: 3000 mL

## 2022-09-10 MED ORDER — FENTANYL CITRATE (PF) 100 MCG/2ML IJ SOLN: 50.0000 ug | Freq: Once | INTRAMUSCULAR | Status: AC

## 2022-09-10 MED ORDER — LACTATED RINGERS IV SOLN: INTRAVENOUS | Status: DC | PRN

## 2022-09-10 MED ORDER — PROPOFOL 10 MG/ML IV BOLUS
INTRAVENOUS | Status: DC | PRN
  Administered 2022-09-10: 15 mg via INTRAVENOUS

## 2022-09-10 MED ORDER — PROMETHAZINE HCL 25 MG/ML IJ SOLN: 6.2500 mg | INTRAMUSCULAR | Status: DC | PRN

## 2022-09-10 MED ORDER — METOCLOPRAMIDE HCL 5 MG/ML IJ SOLN: 5.0000 mg | Freq: Three times a day (TID) | INTRAMUSCULAR | Status: DC | PRN

## 2022-09-10 MED ORDER — ALUM & MAG HYDROXIDE-SIMETH 200-200-20 MG/5ML PO SUSP: 30.0000 mL | ORAL | Status: DC | PRN

## 2022-09-10 MED ORDER — OXYCODONE HCL 5 MG PO TABS
5.0000 mg | ORAL_TABLET | ORAL | Status: DC | PRN
  Administered 2022-09-10: 5 mg via ORAL
  Filled 2022-09-10: qty 1

## 2022-09-10 MED ORDER — ONDANSETRON HCL 4 MG/2ML IJ SOLN
INTRAMUSCULAR | Status: AC
  Filled 2022-09-10: qty 2

## 2022-09-10 MED ORDER — PHENYLEPHRINE 80 MCG/ML (10ML) SYRINGE FOR IV PUSH (FOR BLOOD PRESSURE SUPPORT)
PREFILLED_SYRINGE | INTRAVENOUS | Status: AC
  Filled 2022-09-10: qty 10

## 2022-09-10 MED ORDER — ONDANSETRON HCL 4 MG/2ML IJ SOLN
INTRAMUSCULAR | Status: DC | PRN
  Administered 2022-09-10: 4 mg via INTRAVENOUS

## 2022-09-10 MED ORDER — DIPHENHYDRAMINE HCL 12.5 MG/5ML PO ELIX: 12.5000 mg | ORAL_SOLUTION | ORAL | Status: DC | PRN

## 2022-09-10 MED ORDER — LACTATED RINGERS IV SOLN: INTRAVENOUS | Status: DC

## 2022-09-10 MED ORDER — METOCLOPRAMIDE HCL 5 MG PO TABS: 5.0000 mg | ORAL_TABLET | Freq: Three times a day (TID) | ORAL | Status: DC | PRN

## 2022-09-10 MED ORDER — DOCUSATE SODIUM 100 MG PO CAPS
100.0000 mg | ORAL_CAPSULE | Freq: Two times a day (BID) | ORAL | Status: DC
  Administered 2022-09-11 – 2022-09-12 (×3): 100 mg via ORAL
  Filled 2022-09-10 (×5): qty 1

## 2022-09-10 MED ORDER — MIDAZOLAM HCL 2 MG/2ML IJ SOLN: 1.0000 mg | Freq: Once | INTRAMUSCULAR | Status: AC

## 2022-09-10 MED ORDER — OXYCODONE HCL 5 MG PO TABS: 5.0000 mg | ORAL_TABLET | Freq: Once | ORAL | Status: DC | PRN

## 2022-09-10 MED ORDER — FENTANYL CITRATE (PF) 250 MCG/5ML IJ SOLN
INTRAMUSCULAR | Status: AC
  Filled 2022-09-10: qty 5

## 2022-09-10 MED ORDER — EZETIMIBE 10 MG PO TABS
10.0000 mg | ORAL_TABLET | Freq: Every day | ORAL | Status: DC
  Administered 2022-09-10 – 2022-09-12 (×3): 10 mg via ORAL
  Filled 2022-09-10 (×3): qty 1

## 2022-09-10 MED ORDER — HYDROMORPHONE HCL 1 MG/ML IJ SOLN: 0.5000 mg | INTRAMUSCULAR | Status: DC | PRN

## 2022-09-10 MED ORDER — PHENYLEPHRINE 80 MCG/ML (10ML) SYRINGE FOR IV PUSH (FOR BLOOD PRESSURE SUPPORT)
PREFILLED_SYRINGE | INTRAVENOUS | Status: DC | PRN
  Administered 2022-09-10: 80 ug via INTRAVENOUS

## 2022-09-10 MED ORDER — POVIDONE-IODINE 10 % EX SWAB
2.0000 | Freq: Once | CUTANEOUS | Status: AC
  Administered 2022-09-10: 2 via TOPICAL

## 2022-09-10 MED ORDER — ROPIVACAINE HCL 5 MG/ML IJ SOLN
INTRAMUSCULAR | Status: DC | PRN
  Administered 2022-09-10: 20 mL via PERINEURAL

## 2022-09-10 MED ORDER — CHLORHEXIDINE GLUCONATE 0.12 % MT SOLN
15.0000 mL | Freq: Once | OROMUCOSAL | Status: AC
  Administered 2022-09-10: 15 mL via OROMUCOSAL
  Filled 2022-09-10: qty 15

## 2022-09-10 MED ORDER — ONDANSETRON HCL 4 MG/2ML IJ SOLN: 4.0000 mg | Freq: Four times a day (QID) | INTRAMUSCULAR | Status: DC | PRN

## 2022-09-10 MED ORDER — LIDOCAINE 2% (20 MG/ML) 5 ML SYRINGE
INTRAMUSCULAR | Status: AC
  Filled 2022-09-10: qty 5

## 2022-09-10 MED ORDER — MIDAZOLAM HCL 2 MG/2ML IJ SOLN
INTRAMUSCULAR | Status: AC
  Administered 2022-09-10: 1 mg via INTRAVENOUS
  Filled 2022-09-10: qty 2

## 2022-09-10 MED ORDER — METHOCARBAMOL 500 MG PO TABS
500.0000 mg | ORAL_TABLET | Freq: Four times a day (QID) | ORAL | Status: DC | PRN
  Administered 2022-09-10 – 2022-09-11 (×2): 500 mg via ORAL
  Filled 2022-09-10 (×2): qty 1

## 2022-09-10 MED ORDER — PHENYLEPHRINE HCL-NACL 20-0.9 MG/250ML-% IV SOLN
INTRAVENOUS | Status: DC | PRN
  Administered 2022-09-10: 20 ug/min via INTRAVENOUS

## 2022-09-10 MED ORDER — OXYCODONE HCL 5 MG PO TABS: 10.0000 mg | ORAL_TABLET | ORAL | Status: DC | PRN

## 2022-09-10 SURGICAL SUPPLY — 78 items
BAG COUNTER SPONGE SURGICOUNT (BAG) ×1 IMPLANT
BAG SPNG CNTER NS LX DISP (BAG) ×1
BANDAGE ESMARK 6X9 LF (GAUZE/BANDAGES/DRESSINGS) ×1 IMPLANT
BLADE SAG 18X100X1.27 (BLADE) ×1 IMPLANT
BNDG CMPR 9X6 STRL LF SNTH (GAUZE/BANDAGES/DRESSINGS) ×1
BNDG ELASTIC 6X5.8 VLCR STR LF (GAUZE/BANDAGES/DRESSINGS) ×2 IMPLANT
BNDG ESMARK 6X9 LF (GAUZE/BANDAGES/DRESSINGS) ×1
BONE CEMENT HUM AFFIXUS (Cement) ×1 IMPLANT
BOWL SMART MIX CTS (DISPOSABLE) ×1 IMPLANT
BSPLAT TIB 5D E CMNT STM LT (Knees) ×1 IMPLANT
CATH FOLEY LATEX FREE 16FR (CATHETERS) ×1
CATH FOLEY LF 16FR (CATHETERS) IMPLANT
CEMENT BONE HUM AFFIXUS (Cement) IMPLANT
CEMENT BONE R 1X40 (Cement) ×2 IMPLANT
COMP FEM CEMT PERSONA STD SZ7 (Knees) ×1 IMPLANT
COMPONENT FEM CMT PRSN STD SZ7 (Knees) IMPLANT
COOLER ICEMAN CLASSIC (MISCELLANEOUS) IMPLANT
COVER SURGICAL LIGHT HANDLE (MISCELLANEOUS) ×1 IMPLANT
CUFF TOURN SGL QUICK 34 (TOURNIQUET CUFF) ×1
CUFF TOURN SGL QUICK 42 (TOURNIQUET CUFF) IMPLANT
CUFF TRNQT CYL 34X4.125X (TOURNIQUET CUFF) ×1 IMPLANT
DRAPE EXTREMITY T 121X128X90 (DISPOSABLE) ×1 IMPLANT
DRAPE HALF SHEET 40X57 (DRAPES) ×1 IMPLANT
DRAPE U-SHAPE 47X51 STRL (DRAPES) ×1 IMPLANT
DRSG XEROFORM 1X8 (GAUZE/BANDAGES/DRESSINGS) IMPLANT
DURAPREP 26ML APPLICATOR (WOUND CARE) ×1 IMPLANT
ELECT CAUTERY BLADE 6.4 (BLADE) ×1 IMPLANT
ELECT REM PT RETURN 9FT ADLT (ELECTROSURGICAL) ×1
ELECTRODE REM PT RTRN 9FT ADLT (ELECTROSURGICAL) ×1 IMPLANT
FACESHIELD WRAPAROUND (MASK) ×2 IMPLANT
FACESHIELD WRAPAROUND OR TEAM (MASK) ×2 IMPLANT
GAUZE PAD ABD 8X10 STRL (GAUZE/BANDAGES/DRESSINGS) ×1 IMPLANT
GAUZE SPONGE 4X4 12PLY STRL (GAUZE/BANDAGES/DRESSINGS) ×1 IMPLANT
GAUZE XEROFORM 1X8 LF (GAUZE/BANDAGES/DRESSINGS) ×1 IMPLANT
GLOVE BIOGEL PI IND STRL 8 (GLOVE) ×2 IMPLANT
GLOVE ORTHO TXT STRL SZ7.5 (GLOVE) ×1 IMPLANT
GLOVE SURG ORTHO 8.0 STRL STRW (GLOVE) ×1 IMPLANT
GOWN STRL REUS W/ TWL LRG LVL3 (GOWN DISPOSABLE) IMPLANT
GOWN STRL REUS W/ TWL XL LVL3 (GOWN DISPOSABLE) ×2 IMPLANT
GOWN STRL REUS W/TWL LRG LVL3 (GOWN DISPOSABLE)
GOWN STRL REUS W/TWL XL LVL3 (GOWN DISPOSABLE) ×2
HANDPIECE INTERPULSE COAX TIP (DISPOSABLE) ×1
HDLS TROCR DRIL PIN KNEE 75 (PIN) ×1
IMMOBILIZER KNEE 22 UNIV (SOFTGOODS) ×1 IMPLANT
IV NS 1000ML (IV SOLUTION) ×1
IV NS 1000ML BAXH (IV SOLUTION) ×1 IMPLANT
KIT BASIN OR (CUSTOM PROCEDURE TRAY) ×1 IMPLANT
KIT TURNOVER KIT B (KITS) ×1 IMPLANT
MANIFOLD NEPTUNE II (INSTRUMENTS) ×1 IMPLANT
NDL 18GX1X1/2 (RX/OR ONLY) (NEEDLE) IMPLANT
NEEDLE 18GX1X1/2 (RX/OR ONLY) (NEEDLE) IMPLANT
NS IRRIG 1000ML POUR BTL (IV SOLUTION) ×1 IMPLANT
PACK TOTAL JOINT (CUSTOM PROCEDURE TRAY) ×1 IMPLANT
PAD ARMBOARD 7.5X6 YLW CONV (MISCELLANEOUS) ×1 IMPLANT
PAD COLD SHLDR WRAP-ON (PAD) IMPLANT
PADDING CAST COTTON 6X4 STRL (CAST SUPPLIES) ×1 IMPLANT
PIN DRILL HDLS TROCAR 75 4PK (PIN) IMPLANT
SCREW FEMALE HEX FIX 25X2.5 (ORTHOPEDIC DISPOSABLE SUPPLIES) IMPLANT
SET HNDPC FAN SPRY TIP SCT (DISPOSABLE) ×1 IMPLANT
SET PAD KNEE POSITIONER (MISCELLANEOUS) ×1 IMPLANT
STAPLER VISISTAT 35W (STAPLE) ×1 IMPLANT
STEM MED AS PERS SZ6-7 11 (Stem) IMPLANT
STEM POLY PAT PLY 32M KNEE (Knees) IMPLANT
STEM TIBIA 5 DEG SZ E L KNEE (Knees) IMPLANT
SUCTION FRAZIER HANDLE 10FR (MISCELLANEOUS) ×1
SUCTION TUBE FRAZIER 10FR DISP (MISCELLANEOUS) ×1 IMPLANT
SUT VIC AB 0 CT1 27 (SUTURE) ×1
SUT VIC AB 0 CT1 27XBRD ANBCTR (SUTURE) ×1 IMPLANT
SUT VIC AB 1 CT1 27 (SUTURE) ×2
SUT VIC AB 1 CT1 27XBRD ANBCTR (SUTURE) ×2 IMPLANT
SUT VIC AB 2-0 CT1 27 (SUTURE) ×3
SUT VIC AB 2-0 CT1 TAPERPNT 27 (SUTURE) ×2 IMPLANT
SYR 50ML LL SCALE MARK (SYRINGE) IMPLANT
TIBIA STEM 5 DEG SZ E L KNEE (Knees) ×1 IMPLANT
TOWEL GREEN STERILE (TOWEL DISPOSABLE) ×1 IMPLANT
TOWEL GREEN STERILE FF (TOWEL DISPOSABLE) ×1 IMPLANT
TRAY CATH 16FR W/PLASTIC CATH (SET/KITS/TRAYS/PACK) IMPLANT
WRAP KNEE MAXI GEL POST OP (GAUZE/BANDAGES/DRESSINGS) ×1 IMPLANT

## 2022-09-10 NOTE — Transfer of Care (Addendum)
Immediate Anesthesia Transfer of Care Note  Patient: Teresa Martinez  Procedure(s) Performed: LEFT TOTAL KNEE ARTHROPLASTY (Left: Knee)  Patient Location: PACU  Anesthesia Type:MAC and Spinal  Level of Consciousness: awake, drowsy, patient cooperative and responds to stimulation  Airway & Oxygen Therapy: Patient Spontanous Breathing and Patient connected to face mask oxygen  Post-op Assessment: Report given to RN and Post -op Vital signs reviewed and stable  Post vital signs: Reviewed and stable  Last Vitals:  Vitals Value Taken Time  BP 101/55 09/10/22 1430  Temp    Pulse 67 09/10/22 1433  Resp 17 09/10/22 1433  SpO2 100 % 09/10/22 1433  Vitals shown include unvalidated device data.  Last Pain:  Vitals:   09/10/22 1136  TempSrc:   PainSc: 4          Complications: No notable events documented.

## 2022-09-10 NOTE — Care Plan (Signed)
[facility] RNCM call to patient to discuss her upcoming Left total knee arthroplasty with Dr. Ninfa Linden today. Called several days ago and yesterday and was unable to reach patient. Spoke with her today to review Ortho bundle information. Patient is an Ortho bundle through [facility] and is agreeable to case management. She has a daughter and son, who will be assisting her at home after discharge. She has a RW already. Anticipate HHPT will be needed after a short hospital stay. Referral made to [facility] after choice provided. Reviewed briefly post op instructions. Will continue to follow for needs.

## 2022-09-10 NOTE — Anesthesia Preprocedure Evaluation (Signed)
Anesthesia Evaluation  Patient identified by MRN, date of birth, ID band Patient awake    Reviewed: Allergy & Precautions, NPO status , Patient's Chart, lab work & pertinent test results  Airway Mallampati: II  TM Distance: >3 FB Neck ROM: Full    Dental no notable dental hx.    Pulmonary neg pulmonary ROS,    Pulmonary exam normal breath sounds clear to auscultation       Cardiovascular hypertension, Pt. on medications negative cardio ROS Normal cardiovascular exam Rhythm:Regular Rate:Normal     Neuro/Psych TIACVA negative psych ROS   GI/Hepatic Neg liver ROS, GERD  ,  Endo/Other  negative endocrine ROS  Renal/GU negative Renal ROS  negative genitourinary   Musculoskeletal  (+) Arthritis , Osteoarthritis,    Abdominal   Peds negative pediatric ROS (+)  Hematology negative hematology ROS (+)   Anesthesia Other Findings   Reproductive/Obstetrics negative OB ROS                             Anesthesia Physical Anesthesia Plan  ASA: 3  Anesthesia Plan: Regional and Spinal   Post-op Pain Management: Regional block*   Induction: Intravenous  PONV Risk Score and Plan: 2 and Ondansetron, Midazolam and Treatment may vary due to age or medical condition  Airway Management Planned: Simple Face Mask  Additional Equipment:   Intra-op Plan:   Post-operative Plan:   Informed Consent: I have reviewed the patients History and Physical, chart, labs and discussed the procedure including the risks, benefits and alternatives for the proposed anesthesia with the patient or authorized representative who has indicated his/her understanding and acceptance.     Dental advisory given  Plan Discussed with: CRNA  Anesthesia Plan Comments:         Anesthesia Quick Evaluation

## 2022-09-10 NOTE — Anesthesia Procedure Notes (Signed)
Spinal  Patient location during procedure: OR Start time: 09/10/2022 12:37 PM End time: 09/10/2022 12:42 PM Reason for block: surgical anesthesia Staffing Performed: anesthesiologist  Anesthesiologist: Lynda Rainwater, MD Performed by: Lynda Rainwater, MD Authorized by: Lynda Rainwater, MD   Preanesthetic Checklist Completed: patient identified, IV checked, site marked, risks and benefits discussed, surgical consent, monitors and equipment checked, pre-op evaluation and timeout performed Spinal Block Patient position: sitting Prep: DuraPrep Patient monitoring: heart rate, cardiac monitor, continuous pulse ox and blood pressure Approach: midline Location: L3-4 Injection technique: single-shot Needle Needle type: Quincke  Needle gauge: 22 G Needle length: 9 cm Assessment Sensory level: T4 Events: CSF return

## 2022-09-10 NOTE — Anesthesia Postprocedure Evaluation (Signed)
Anesthesia Post Note  Patient: Teresa Martinez  Procedure(s) Performed: LEFT TOTAL KNEE ARTHROPLASTY (Left: Knee)     Patient location during evaluation: PACU Anesthesia Type: Regional and Spinal Level of consciousness: awake and alert Pain management: pain level controlled Vital Signs Assessment: post-procedure vital signs reviewed and stable Respiratory status: spontaneous breathing, nonlabored ventilation and respiratory function stable Cardiovascular status: blood pressure returned to baseline and stable Postop Assessment: no apparent nausea or vomiting Anesthetic complications: no   No notable events documented.  Last Vitals:  Vitals:   09/10/22 1600 09/10/22 1615  BP: 109/68 118/60  Pulse: 65 64  Resp: 12 10  Temp:    SpO2: 100% 100%    Last Pain:  Vitals:   09/10/22 1600  TempSrc:   PainSc: Asleep                 Lynda Rainwater

## 2022-09-10 NOTE — Op Note (Signed)
Operative Note  Date of operation: 09/10/2022 Preoperative diagnosis: Left knee osteoarthritis and DJD Postoperative diagnosis: Same  Procedure: Cemented left total knee arthroplasty  Implants: Biomet/Zimmer persona knee system with size 7 standard left CR femur, size E left tibial tray, 11 mm medial congruent left polythene insert, 32 mm patella button  Surgeon: Lind Guest. Ninfa Linden, MD Assistant: Benita Stabile, PA-C  Anesthesia: #1 left lower extremity regional block, #2 spinal EBL: Less than 100 cc Tourniquet time: Less than 1 hour Antibiotics: 2 g IV Ancef Complications: None  Indications: The patient is an 81 year old female well-known to me.  She has well-documented severe end-stage arthritis of her left knee and has tried and failed all forms conservative treatment.  At this point her left knee pain is daily and it is definitely affecting her mobility, her quality of life and actives daily living to the point she does wish to proceed with a total knee arthroplasty.  We talked in length in detail the risk of acute blood loss anemia, nerve vessel injury, fracture, infection, DVT, implant failure and wound healing issues.  We talked about her goals being decreased pain, improve mobility and hopefully improve quality of life.  Procedure description: After informed consent was obtained and the appropriate left knee was marked, anesthesia obtained a regional block of the left lower extremity the holding room and the patient was brought to the operating room and set up on the operating table where spinal anesthesia was obtained.  She was then laid in supine position on the operating table and a Foley catheter was placed.  A nonsterile tourniquet is placed around her upper left thigh and her left thigh, leg, knee, ankle and foot were prepped and draped with DuraPrep and sterile drapes.  A timeout was called and she identified the correct patient correct left knee.  An Esmarch was then used to  wrap out the leg and the tourniquet was inflated to 300 mm of pressure.  A direct midline incision was then made over the patella and carried proximally and distally.  Dissection was carried down the knee joint and a medial parapatellar arthrotomy was carried out.  A large joint effusion was in seen.  With the knee in a flexed position remanence of the ACL, medial lateral meniscus and osteophytes from all 3 compartments removed.  There was noted to be full-thickness cartilage loss in all 3 compartments.  Using an extramedullary cutting guide for making her proximal tibia cut, the cutting guide was set for a 3 degree slope and correction for varus and valgus and taking 2 mm off the low side.  We made this cut without difficulty.  Attention was then turned the femur.  Using an intramedullary cutting guide we set our distal femoral cut for 10 mm distal femoral cut for a left knee at 5 degrees external rotated and made the cut without difficulty and brought the knee back down to full extension and achieve full extension with a 10 mm extension block.  Attention was then turned back to the femur.  With the knee in a flexed position we chose a femoral sizing guide based off the epicondylar axis for a left knee at 3 degrees.  Based off of this we chose a size 7 femur.  We put a 4-in-1 cutting block for size 7 femur and made the anterior and posterior cuts followed by her chamfer cuts.  We then backed the tibia and chose a size E left tibial tray for coverage over the tibial  plateau setting the rotation of the tibial tubercle and the femur.  We did our keel punch and drill hole off of this.  We then trialed our size E left tibia followed by trialing our size left 7 femur.  We trialed a 10 mm fixed-bearing medial congruent left polyethylene insert and went up to an 11 insert and that so we found better stability and still had good range of motion.  We then made a patella cut and drilled 3 holes for a size 32 patella button.   With all transportation the knee with the knee through several cycles of motion and we are pleased with range of motion and stability.  All trials rotation was removed and we irrigated the knee with normal saline solution.  The cement was mixed in with the knee in a flexed position we cemented our Biomet Zimmer size E left tibial tray followed by cementing our size 7 left standard CR femur.  Excess cement debris was removed from the knee and we placed our medial congruent 11 mm thickness left polythene insert and cemented our size 32 patella button.  We then held the knee fully extended while the cement hardened.  Once it hardened the tourniquet was let down hemostasis was obtained with electrocautery.  The arthrotomy was then closed with interrupted #1 Vicryl suture followed by 0 Vicryl to close the deep tissue and 2-0 Vicryl to close the subcutaneous tissue.  The skin was closed with staples.  Well-padded sterile dressings applied.  The patient was taken recovery in stable addition with all final counts being correct and no complications noted.  Of note Benita Stabile, PA-C did assist the entire case from beginning to end and his assistance was medically necessary and crucial for soft tissue management and retraction, helping guide implant placement and a layered closure of the wound.

## 2022-09-10 NOTE — Anesthesia Procedure Notes (Signed)
Anesthesia Regional Block: Adductor canal block   Pre-Anesthetic Checklist: , timeout performed,  Correct Patient, Correct Site, Correct Laterality,  Correct Procedure, Correct Position, site marked,  Risks and benefits discussed,  Surgical consent,  Pre-op evaluation,  At surgeon's request and post-op pain management  Laterality: Left  Prep: chloraprep       Needles:  Injection technique: Single-shot  Needle Type: Stimiplex     Needle Length: 9cm  Needle Gauge: 21     Additional Needles:   Procedures:,,,, ultrasound used (permanent image in chart),,    Narrative:  Start time: 09/10/2022 12:08 PM End time: 09/10/2022 12:13 PM Injection made incrementally with aspirations every 5 mL.  Performed by: Personally  Anesthesiologist: Lynda Rainwater, MD

## 2022-09-10 NOTE — Telephone Encounter (Signed)
Ortho bundle pre-op call completed. 

## 2022-09-10 NOTE — H&P (Signed)
TOTAL KNEE ADMISSION H&P  Patient is being admitted for left total knee arthroplasty.  Subjective:  Chief Complaint:left knee pain.  HPI: Teresa Martinez, 81 y.o. female, has a history of pain and functional disability in the left knee due to arthritis and has failed non-surgical conservative treatments for greater than 12 weeks to includeNSAID's and/or analgesics, corticosteriod injections, viscosupplementation injections, flexibility and strengthening excercises, use of assistive devices, and activity modification.  Onset of symptoms was gradual, starting 3 years ago with gradually worsening course since that time. The patient noted no past surgery on the left knee(s).  Patient currently rates pain in the left knee(s) at 10 out of 10 with activity. Patient has night pain, worsening of pain with activity and weight bearing, pain that interferes with activities of daily living, and pain with passive range of motion.  Patient has evidence of subchondral sclerosis, periarticular osteophytes, and joint space narrowing by imaging studies. There is no active infection.  Patient Active Problem List   Diagnosis Date Noted   Unilateral primary osteoarthritis, left knee 09/10/2022   IGT (impaired glucose tolerance) 07/27/2021   Preop testing 07/09/2018   Degenerative lumbar spinal stenosis 05/12/2018   Statin myopathy 08/29/2016   Iliotibial band syndrome of right side 04/23/2016   Acromioclavicular arthrosis 06/30/2015   Left carpal tunnel syndrome 05/15/2015   Adhesive capsulitis of right shoulder 02/06/2015   Dysarthria due to cerebrovascular accident 01/09/2015   Hemiparesis affecting dominant side as late effect of cerebrovascular accident ([place]) 12/07/2014   Spastic neurogenic bladder 10/31/2014   Right rotator cuff tendonitis 10/19/2014   Stenosis of cervical spine region    Chronic right shoulder pain    Cerebral infarction due to thrombosis of left middle cerebral artery ([place])     Left pontine stroke ([place])    TIA (transient ischemic attack) 10/14/2014   Overactive bladder 10/14/2014   H/O: CVA (cerebrovascular accident) 10/14/2014   NECK PAIN, CHRONIC 10/29/2010   Essential hypertension 10/10/2009   INSOMNIA 09/15/2009   Pure hypercholesterolemia 11/28/2008   MENOPAUSAL SYNDROME 11/28/2008   Past Medical History:  Diagnosis Date   Cervical spine degeneration    C5/C6   Chronic right shoulder pain    GERD (gastroesophageal reflux disease)    Hemiparesis affecting dominant side as late effect of cerebrovascular accident ([place]) 12/07/2014   Hyperlipemia    Hypertension    Menopausal syndrome    [name]   Overactive bladder    Statin intolerance    Stroke ([place]) 2015    Past Surgical History:  Procedure Laterality Date   ABDOMINAL HYSTERECTOMY     1985   BAND HEMORRHOIDECTOMY     2010   CATARACT EXTRACTION, BILATERAL     CHOLECYSTECTOMY     1950   LUMBAR LAMINECTOMY Bilateral 08/03/2018   Dr. Octavio Manns, L3 laminotomy, L4-L5 laminectomy with neural foraminal decompression.   PAROTIDECTOMY     30 years ago    Current Facility-Administered Medications  Medication Dose Route Frequency Provider Last Rate Last Admin   ceFAZolin (ANCEF) IVPB 2g/100 mL premix  2 g Intravenous On Call to OR Pete Pelt, PA-C       fentaNYL (SUBLIMAZE) 100 MCG/2ML injection            lactated ringers infusion   Intravenous Continuous Ellender, Karyl Kinnier, MD 10 mL/hr at 09/10/22 1121 New Bag at 09/10/22 1121   midazolam (VERSED) 2 MG/2ML injection            tranexamic acid (CYKLOKAPRON)  IVPB 1,000 mg  1,000 mg Intravenous To OR Pete Pelt, PA-C       Allergies  Allergen Reactions   Statins Other (See Comments)    Muscle pain and unable to walk Muscle pain and unable to walk   Wasp Venom    Latex Other (See Comments)    Burns her skin    Social History   Tobacco Use   Smoking status: Never   Smokeless tobacco: Never  Substance Use Topics    Alcohol use: No    Family History  Problem Relation Age of Onset   Heart failure Father    Diabetes Mother    Hypertension Mother    Stroke Mother    Cirrhosis Sister    Diabetes Sister    Hyperlipidemia Brother    Hypertension Brother    Stroke Brother    Diabetes Brother      Review of Systems  All other systems reviewed and are negative.   Objective:  Physical Exam Vitals reviewed.  Constitutional:      Appearance: Normal appearance. She is normal weight.  HENT:     Head: Normocephalic and atraumatic.  Eyes:     Extraocular Movements: Extraocular movements intact.     Pupils: Pupils are equal, round, and reactive to light.  Cardiovascular:     Rate and Rhythm: Normal rate.     Pulses: Normal pulses.  Pulmonary:     Breath sounds: Normal breath sounds.  Abdominal:     General: Abdomen is flat.     Palpations: Abdomen is soft.  Musculoskeletal:     Cervical back: Normal range of motion and neck supple.     Left knee: Bony tenderness and crepitus present. Decreased range of motion. Tenderness present over the medial joint line, lateral joint line and patellar tendon. Abnormal alignment.  Neurological:     Mental Status: She is alert and oriented to person, place, and time.  Psychiatric:        Behavior: Behavior normal.     Vital signs in last 24 hours: Temp:  [98.4 F (36.9 C)] 98.4 F (36.9 C) (10/24 1010) Pulse Rate:  [83-88] 83 (10/24 1130) Resp:  [16-18] 16 (10/24 1130) BP: (147-180)/(73-83) 147/73 (10/24 1045) SpO2:  [97 %-98 %] 97 % (10/24 1130) Weight:  [62.6 kg] 62.6 kg (10/24 1010)  Labs:   Estimated body mass index is 22.27 kg/m as calculated from the following:   Height as of this encounter: 5\' 6"  (1.676 m).   Weight as of this encounter: 62.6 kg.   Imaging Review Plain radiographs demonstrate severe degenerative joint disease of the left knee(s). The overall alignment ismild varus. The bone quality appears to be good for age and  reported activity level.      Assessment/Plan:  End stage arthritis, left knee   The patient history, physical examination, clinical judgment of the provider and imaging studies are consistent with end stage degenerative joint disease of the left knee(s) and total knee arthroplasty is deemed medically necessary. The treatment options including medical management, injection therapy arthroscopy and arthroplasty were discussed at length. The risks and benefits of total knee arthroplasty were presented and reviewed. The risks due to aseptic loosening, infection, stiffness, patella tracking problems, thromboembolic complications and other imponderables were discussed. The patient acknowledged the explanation, agreed to proceed with the plan and consent was signed. Patient is being admitted for inpatient treatment for surgery, pain control, PT, OT, prophylactic antibiotics, VTE prophylaxis, progressive ambulation and ADL's  and discharge planning. The patient is planning to be discharged home with home health services

## 2022-09-11 ENCOUNTER — Other Ambulatory Visit: Payer: Self-pay | Admitting: *Deleted

## 2022-09-11 ENCOUNTER — Encounter (HOSPITAL_COMMUNITY): Payer: Self-pay | Admitting: Orthopaedic Surgery

## 2022-09-11 DIAGNOSIS — I10 Essential (primary) hypertension: Secondary | ICD-10-CM | POA: Diagnosis not present

## 2022-09-11 DIAGNOSIS — M1712 Unilateral primary osteoarthritis, left knee: Secondary | ICD-10-CM | POA: Diagnosis not present

## 2022-09-11 DIAGNOSIS — Z8673 Personal history of transient ischemic attack (TIA), and cerebral infarction without residual deficits: Secondary | ICD-10-CM | POA: Diagnosis not present

## 2022-09-11 DIAGNOSIS — Z96652 Presence of left artificial knee joint: Secondary | ICD-10-CM

## 2022-09-11 LAB — CBC
HCT: 36.3 % (ref 36.0–46.0)
Hemoglobin: 11.7 g/dL — ABNORMAL LOW (ref 12.0–15.0)
MCH: 26.6 pg (ref 26.0–34.0)
MCHC: 32.2 g/dL (ref 30.0–36.0)
MCV: 82.5 fL (ref 80.0–100.0)
Platelets: 220 10*3/uL (ref 150–400)
RBC: 4.4 MIL/uL (ref 3.87–5.11)
RDW: 13.9 % (ref 11.5–15.5)
WBC: 10 10*3/uL (ref 4.0–10.5)
nRBC: 0 % (ref 0.0–0.2)

## 2022-09-11 LAB — BASIC METABOLIC PANEL
Anion gap: 11 (ref 5–15)
BUN: 8 mg/dL (ref 8–23)
CO2: 24 mmol/L (ref 22–32)
Calcium: 9.2 mg/dL (ref 8.9–10.3)
Chloride: 104 mmol/L (ref 98–111)
Creatinine, Ser: 0.83 mg/dL (ref 0.44–1.00)
GFR, Estimated: >60 mL/min (ref >60)
Glucose, Bld: 125 mg/dL — ABNORMAL HIGH (ref 70–99)
Potassium: 3.9 mmol/L (ref 3.5–5.1)
Sodium: 139 mmol/L (ref 135–145)

## 2022-09-11 MED ORDER — CELECOXIB 200 MG PO CAPS: 200.0000 mg | ORAL_CAPSULE | Freq: Two times a day (BID) | ORAL | Status: DC | PRN

## 2022-09-11 MED ORDER — KETOROLAC TROMETHAMINE 15 MG/ML IJ SOLN
7.5000 mg | Freq: Four times a day (QID) | INTRAMUSCULAR | Status: AC
  Administered 2022-09-11 (×2): 7.5 mg via INTRAVENOUS
  Filled 2022-09-11 (×2): qty 1

## 2022-09-11 MED ORDER — LISINOPRIL 10 MG PO TABS
10.0000 mg | ORAL_TABLET | Freq: Every day | ORAL | Status: DC
  Administered 2022-09-11 – 2022-09-12 (×2): 10 mg via ORAL
  Filled 2022-09-11 (×2): qty 1

## 2022-09-11 MED ORDER — TRAMADOL HCL 50 MG PO TABS
50.0000 mg | ORAL_TABLET | Freq: Four times a day (QID) | ORAL | Status: DC | PRN
  Administered 2022-09-11 – 2022-09-12 (×4): 50 mg via ORAL
  Filled 2022-09-11 (×4): qty 1

## 2022-09-11 NOTE — Progress Notes (Signed)
Physical Therapy Treatment Patient Details Name: Teresa Martinez MRN: [id number] DOB: [birth year] Today's Date: 09/11/2022   History of Present Illness 81 y/o s/p R TKA on 09/10/22. PMH: CVA with R hemiparesis, HTN, neurogenic bladder    PT Comments    Patient progressing towards physical therapy goals. Patient continues to ambulate at min guard level with RW. Able to negotiate 3 stairs sideways with minA to prepare for discharge home next date. Encouraged heel slides, quad sets, SLR while in bed. D/c plan remains appropriate.     Recommendations for follow up therapy are one component of a multi-disciplinary discharge planning process, led by the attending physician.  Recommendations may be updated based on patient status, additional functional criteria and insurance authorization.  Follow Up Recommendations  Follow physician's recommendations for discharge plan and follow up therapies     Assistance Recommended at Discharge Frequent or constant Supervision/Assistance  Patient can return home with the following A little help with walking and/or transfers;A little help with bathing/dressing/bathroom;Assistance with cooking/housework;Assist for transportation;Help with stairs or ramp for entrance   Equipment Recommendations  Rolling Jovanni Eckhart (2 wheels)    Recommendations for Other Services       Precautions / Restrictions Precautions Precautions: Fall;Knee Precaution Booklet Issued: Yes (comment) Restrictions Weight Bearing Restrictions: Yes LLE Weight Bearing: Weight bearing as tolerated     Mobility  Bed Mobility Overal bed mobility: Needs Assistance Bed Mobility: Supine to Sit     Supine to sit: Min guard     General bed mobility comments: increased time to complete but able to manage LLE with use of UEs and R LE    Transfers Overall transfer level: Needs assistance Equipment used: Rolling Saara Kijowski (2 wheels) Transfers: Sit to/from Stand Sit to Stand: Min  assist           General transfer comment: assist to boost up into standing. Continues to require cues for hand placement    Ambulation/Gait Ambulation/Gait assistance: Min guard, Supervision Gait Distance (Feet): 100 Feet Assistive device: Rolling Keisuke Hollabaugh (2 wheels) Gait Pattern/deviations: Step-to pattern, Decreased stance time - left, Decreased stride length, Antalgic, Trunk flexed Gait velocity: decreased     General Gait Details: cues for upright posture and sequencing of ambulation with RW. Min guard for safety progressing to supervision. No overt LOB. Complaining of pain throughout   Stairs Stairs: Yes Stairs assistance: Min assist Stair Management: One rail Left, Step to pattern, Sideways Number of Stairs: 3 General stair comments: instructed on up with good and down with bad for safe stair negotiation. Assist for balance and boost up stair   Wheelchair Mobility    Modified Rankin (Stroke Patients Only)       Balance Overall balance assessment: Needs assistance Sitting-balance support: No upper extremity supported, Feet supported Sitting balance-Leahy Scale: Good     Standing balance support: Bilateral upper extremity supported, Reliant on assistive device for balance Standing balance-Leahy Scale: Poor Standing balance comment: reliant on UE support                            Cognition Arousal/Alertness: Awake/alert Behavior During Therapy: WFL for tasks assessed/performed Overall Cognitive Status: Within Functional Limits for tasks assessed                                          Exercises  General Comments        Pertinent Vitals/Pain Pain Assessment Pain Assessment: Faces Faces Pain Scale: Hurts even more Pain Location: L knee Pain Descriptors / Indicators: Discomfort, Grimacing, Guarding, Throbbing Pain Intervention(s): Limited activity within patient's tolerance, Monitored during session    Home Living                           Prior Function            PT Goals (current goals can now be found in the care plan section) Acute Rehab PT Goals Patient Stated Goal: to reduce pain PT Goal Formulation: With patient Time For Goal Achievement: 09/25/22 Potential to Achieve Goals: Good Progress towards PT goals: Progressing toward goals    Frequency    7X/week      PT Plan Current plan remains appropriate    Co-evaluation              AM-PAC PT "6 Clicks" Mobility   Outcome Measure  Help needed turning from your back to your side while in a flat bed without using bedrails?: A Little Help needed moving from lying on your back to sitting on the side of a flat bed without using bedrails?: A Little Help needed moving to and from a bed to a chair (including a wheelchair)?: A Little Help needed standing up from a chair using your arms (e.g., wheelchair or bedside chair)?: A Little Help needed to walk in hospital room?: A Little Help needed climbing 3-5 steps with a railing? : A Little 6 Click Score: 18    End of Session Equipment Utilized During Treatment: Gait belt Activity Tolerance: Patient tolerated treatment well Patient left: in bed;with call bell/phone within reach;with family/visitor present Nurse Communication: Mobility status PT Visit Diagnosis: Unsteadiness on feet (R26.81);Muscle weakness (generalized) (M62.81);Difficulty in walking, not elsewhere classified (R26.2)     Time: 6578-4696 PT Time Calculation (min) (ACUTE ONLY): 41 min  Charges:  $Gait Training: 23-37 mins $Therapeutic Activity: 8-22 mins                     Ormand Senn A. Gilford Rile PT, DPT Acute Rehabilitation Services Office [phone]    Linna Hoff 09/11/2022, 4:40 PM

## 2022-09-11 NOTE — Progress Notes (Signed)
Subjective: 1 Day Post-Op Procedure(s) (LRB): LEFT TOTAL KNEE ARTHROPLASTY (Left) Patient reports pain as moderate.  Does not tolerate any pain meds - gets too light-headed.  Vitals stable. Hgb/Hct stable.  Objective: Vital signs in last 24 hours: Temp:  [97 F (36.1 C)-99.1 F (37.3 C)] 98.4 F (36.9 C) (10/25 0734) Pulse Rate:  [64-88] 81 (10/25 0734) Resp:  [0-19] 16 (10/25 0734) BP: (101-180)/(55-95) 140/69 (10/25 0734) SpO2:  [97 %-100 %] 100 % (10/25 0734) Weight:  [62.6 kg] 62.6 kg (10/24 1010)  Intake/Output from previous day: 10/24 0701 - 10/25 0700 In: 1100 [I.V.:1100] Out: 1825 [Urine:1775; Blood:50] Intake/Output this shift: No intake/output data recorded.  Recent Labs    09/11/22 0642  HGB 11.7*   Recent Labs    09/11/22 0642  WBC 10.0  RBC 4.40  HCT 36.3  PLT 220   No results for input(s): "NA", "K", "CL", "CO2", "BUN", "CREATININE", "GLUCOSE", "CALCIUM" in the last 72 hours. No results for input(s): "LABPT", "INR" in the last 72 hours.  Sensation intact distally Intact pulses distally Dorsiflexion/Plantar flexion intact Incision: dressing C/D/I   Assessment/Plan: 1 Day Post-Op Procedure(s) (LRB): LEFT TOTAL KNEE ARTHROPLASTY (Left) Up with therapy      Teresa Martinez 09/11/2022, 7:48 AM

## 2022-09-11 NOTE — Evaluation (Signed)
Physical Therapy Evaluation Patient Details Name: Teresa Martinez MRN: [id number] DOB: 1941/09/04 Today's Date: 09/11/2022  History of Present Illness  81 y/o s/p R TKA on 09/10/22. PMH: CVA with R hemiparesis, HTN, neurogenic bladder  Clinical Impression  Patient admitted with the above. PTA, patient lives with children who will be available to assist at discharge. Patient reports independence with use of cane for community mobility. Patient presents with post op pain and weakness and impaired ROM. Requires minA for bed mobility and transfers. Ambulating in hallway with min guard and use of RW. Encouraged quad sets and ankle pumps while seated in recliner. Patient will benefit from skilled PT services during acute stay to address listed deficits. Will follow acutely.      Recommendations for follow up therapy are one component of a multi-disciplinary discharge planning process, led by the attending physician.  Recommendations may be updated based on patient status, additional functional criteria and insurance authorization.  Follow Up Recommendations Follow physician's recommendations for discharge plan and follow up therapies      Assistance Recommended at Discharge Frequent or constant Supervision/Assistance  Patient can return home with the following  A little help with walking and/or transfers;A little help with bathing/dressing/bathroom;Assistance with cooking/housework;Assist for transportation;Help with stairs or ramp for entrance    Equipment Recommendations Rolling Simisola Sandles (2 wheels)  Recommendations for Other Services       Functional Status Assessment Patient has had a recent decline in their functional status and demonstrates the ability to make significant improvements in function in a reasonable and predictable amount of time.     Precautions / Restrictions Precautions Precautions: Fall;Knee Precaution Booklet Issued: Yes (comment) Required Braces or Orthoses:  Knee Immobilizer - Left Knee Immobilizer - Left: Discontinue once straight leg raise with < 10 degree lag Restrictions Weight Bearing Restrictions: Yes LLE Weight Bearing: Weight bearing as tolerated      Mobility  Bed Mobility Overal bed mobility: Needs Assistance Bed Mobility: Supine to Sit     Supine to sit: Min assist     General bed mobility comments: assist for LLE management    Transfers Overall transfer level: Needs assistance Equipment used: Rolling Terena Bohan (2 wheels) Transfers: Sit to/from Stand Sit to Stand: Min assist           General transfer comment: cues for hand placement. Assist to steady upon standing. Increased time to complete    Ambulation/Gait Ambulation/Gait assistance: Min guard Gait Distance (Feet): 120 Feet Assistive device: Rolling Tiffinie Caillier (2 wheels) Gait Pattern/deviations: Step-to pattern, Decreased stance time - left, Decreased stride length, Antalgic, Trunk flexed Gait velocity: decreased     General Gait Details: cues for upright posture and sequencing of ambulation with RW. Min guard for safety. No overt LOB. Complaining of pain throughout  Stairs            Wheelchair Mobility    Modified Rankin (Stroke Patients Only)       Balance Overall balance assessment: Needs assistance Sitting-balance support: No upper extremity supported, Feet supported Sitting balance-Leahy Scale: Good     Standing balance support: Bilateral upper extremity supported, Reliant on assistive device for balance Standing balance-Leahy Scale: Poor Standing balance comment: reliant on UE support                             Pertinent Vitals/Pain Pain Assessment Pain Assessment: Faces Faces Pain Scale: Hurts even more Pain Location: L knee Pain Descriptors / Indicators:  Discomfort, Grimacing, Guarding, Throbbing Pain Intervention(s): Monitored during session, Repositioned, Premedicated before session    Home Living Family/patient  expects to be discharged to:: Private residence Living Arrangements: Children Available Help at Discharge: Family;Available 24 hours/day Type of Home: House Home Access: Stairs to enter Entrance Stairs-Rails: Doctor, general practice of Steps: 5   Home Layout: One level Home Equipment: Agricultural consultant (2 wheels);Cane - single point;Shower seat      Prior Function Prior Level of Function : Independent/Modified Independent;History of Falls (last six months)             Mobility Comments: uses cane for community mobility but utilizes walls in house if unstable. Reports 1 fall in past 6 months       Hand Dominance        Extremity/Trunk Assessment   Upper Extremity Assessment Upper Extremity Assessment: Overall WFL for tasks assessed    Lower Extremity Assessment Lower Extremity Assessment: LLE deficits/detail LLE Deficits / Details: deficits consistent with post op pain and weakness    Cervical / Trunk Assessment Cervical / Trunk Assessment: Kyphotic  Communication   Communication: No difficulties  Cognition Arousal/Alertness: Awake/alert Behavior During Therapy: WFL for tasks assessed/performed Overall Cognitive Status: Within Functional Limits for tasks assessed                                          General Comments      Exercises     Assessment/Plan    PT Assessment Patient needs continued PT services  PT Problem List Decreased strength;Decreased balance;Decreased activity tolerance;Decreased mobility;Decreased knowledge of use of DME;Decreased safety awareness;Decreased knowledge of precautions;Pain       PT Treatment Interventions DME instruction;Gait training;Stair training;Functional mobility training;Therapeutic activities;Therapeutic exercise;Balance training;Patient/family education    PT Goals (Current goals can be found in the Care Plan section)  Acute Rehab PT Goals Patient Stated Goal: to reduce pain PT Goal  Formulation: With patient Time For Goal Achievement: 09/25/22 Potential to Achieve Goals: Good    Frequency 7X/week     Co-evaluation               AM-PAC PT "6 Clicks" Mobility  Outcome Measure Help needed turning from your back to your side while in a flat bed without using bedrails?: A Little Help needed moving from lying on your back to sitting on the side of a flat bed without using bedrails?: A Little Help needed moving to and from a bed to a chair (including a wheelchair)?: A Little Help needed standing up from a chair using your arms (e.g., wheelchair or bedside chair)?: A Little Help needed to walk in hospital room?: A Little Help needed climbing 3-5 steps with a railing? : A Lot 6 Click Score: 17    End of Session Equipment Utilized During Treatment: Gait belt;Left knee immobilizer Activity Tolerance: Patient limited by pain Patient left: in chair;with call bell/phone within reach Nurse Communication: Mobility status PT Visit Diagnosis: Unsteadiness on feet (R26.81);Muscle weakness (generalized) (M62.81);Difficulty in walking, not elsewhere classified (R26.2)    Time: 1478-2956 PT Time Calculation (min) (ACUTE ONLY): 33 min   Charges:   PT Evaluation $PT Eval Low Complexity: 1 Low PT Treatments $Gait Training: 8-22 mins        Shandria Clinch A. Dan Humphreys PT, DPT Acute Rehabilitation Services Office [phone]   Viviann Spare 09/11/2022, 9:31 AM

## 2022-09-11 NOTE — Discharge Instructions (Signed)

## 2022-09-12 ENCOUNTER — Telehealth: Payer: Self-pay | Admitting: *Deleted

## 2022-09-12 DIAGNOSIS — Z8673 Personal history of transient ischemic attack (TIA), and cerebral infarction without residual deficits: Secondary | ICD-10-CM | POA: Diagnosis not present

## 2022-09-12 DIAGNOSIS — M1712 Unilateral primary osteoarthritis, left knee: Secondary | ICD-10-CM | POA: Diagnosis not present

## 2022-09-12 DIAGNOSIS — I10 Essential (primary) hypertension: Secondary | ICD-10-CM | POA: Diagnosis not present

## 2022-09-12 MED ORDER — ASPIRIN 81 MG PO CHEW: 81.0000 mg | CHEWABLE_TABLET | Freq: Two times a day (BID) | ORAL | 0 refills | Status: DC

## 2022-09-12 MED ORDER — TRAMADOL HCL 50 MG PO TABS: 50.0000 mg | ORAL_TABLET | Freq: Four times a day (QID) | ORAL | 0 refills | Status: DC | PRN

## 2022-09-12 MED ORDER — ORAL CARE MOUTH RINSE: 15.0000 mL | OROMUCOSAL | Status: DC | PRN

## 2022-09-12 NOTE — Progress Notes (Signed)
Physical Therapy Treatment Patient Details Name: Teresa Martinez MRN: [id number] DOB: [birth year] Today's Date: 09/12/2022   History of Present Illness 81 y/o s/p R TKA on 09/10/22. PMH: CVA with R hemiparesis, HTN, neurogenic bladder    PT Comments    Patient progressing towards physical therapy goals despite pain and fatigue. Extensive time required for ambulation this date due to fatigue but still ambulating at supervision level. Requires minA for stair negotiation sideways for balance with cues for utilizing UE to offweight R LE. Difficulty processing noted but may be due to medication. D/c plan remains appropriate.     Recommendations for follow up therapy are one component of a multi-disciplinary discharge planning process, led by the attending physician.  Recommendations may be updated based on patient status, additional functional criteria and insurance authorization.  Follow Up Recommendations  Follow physician's recommendations for discharge plan and follow up therapies     Assistance Recommended at Discharge Frequent or constant Supervision/Assistance  Patient can return home with the following A little help with walking and/or transfers;A little help with bathing/dressing/bathroom;Assistance with cooking/housework;Assist for transportation;Help with stairs or ramp for entrance   Equipment Recommendations  Rolling Allsion Nogales (2 wheels)    Recommendations for Other Services       Precautions / Restrictions Precautions Precautions: Fall;Knee Precaution Booklet Issued: Yes (comment) Restrictions Weight Bearing Restrictions: Yes LLE Weight Bearing: Weight bearing as tolerated     Mobility  Bed Mobility Overal bed mobility: Needs Assistance Bed Mobility: Supine to Sit     Supine to sit: Min guard     General bed mobility comments: increased time to complete but able to manage LLE with use of UEs and R LE    Transfers Overall transfer level: Needs  assistance Equipment used: Rolling Kwaku Mostafa (2 wheels) Transfers: Sit to/from Stand Sit to Stand: Min assist           General transfer comment: assist to boost into standing due to initial posterior lean    Ambulation/Gait Ambulation/Gait assistance: Supervision Gait Distance (Feet): 100 Feet Assistive device: Rolling Inna Tisdell (2 wheels) Gait Pattern/deviations: Step-to pattern, Decreased stance time - left, Decreased stride length, Antalgic, Trunk flexed Gait velocity: decreased     General Gait Details: cues for upright posture and sequencing. Supervision for safety   Stairs Stairs: Yes Stairs assistance: Min assist Stair Management: One rail Left, Step to pattern, Sideways Number of Stairs: 3 General stair comments: cues for use of UEs to offweight R LE but patient having difficulty processing instruction. Assist for balance and boost up   Wheelchair Mobility    Modified Rankin (Stroke Patients Only)       Balance Overall balance assessment: Needs assistance Sitting-balance support: No upper extremity supported, Feet supported Sitting balance-Leahy Scale: Good     Standing balance support: Bilateral upper extremity supported, Reliant on assistive device for balance Standing balance-Leahy Scale: Poor                              Cognition Arousal/Alertness: Awake/alert Behavior During Therapy: WFL for tasks assessed/performed Overall Cognitive Status: Within Functional Limits for tasks assessed                                          Exercises      General Comments        Pertinent Vitals/Pain  Pain Assessment Pain Assessment: Faces Faces Pain Scale: Hurts even more Pain Location: L knee Pain Descriptors / Indicators: Discomfort, Grimacing, Guarding, Throbbing Pain Intervention(s): Monitored during session    Home Living                          Prior Function            PT Goals (current goals can now  be found in the care plan section) Acute Rehab PT Goals Patient Stated Goal: to reduce pain PT Goal Formulation: With patient Time For Goal Achievement: 09/25/22 Potential to Achieve Goals: Good Progress towards PT goals: Progressing toward goals    Frequency    7X/week      PT Plan Current plan remains appropriate    Co-evaluation              AM-PAC PT "6 Clicks" Mobility   Outcome Measure  Help needed turning from your back to your side while in a flat bed without using bedrails?: A Little Help needed moving from lying on your back to sitting on the side of a flat bed without using bedrails?: A Little Help needed moving to and from a bed to a chair (including a wheelchair)?: A Little Help needed standing up from a chair using your arms (e.g., wheelchair or bedside chair)?: A Little Help needed to walk in hospital room?: A Little Help needed climbing 3-5 steps with a railing? : A Little 6 Click Score: 18    End of Session Equipment Utilized During Treatment: Gait belt Activity Tolerance: Patient tolerated treatment well Patient left: in bed;with call bell/phone within reach Nurse Communication: Mobility status PT Visit Diagnosis: Unsteadiness on feet (R26.81);Muscle weakness (generalized) (M62.81);Difficulty in walking, not elsewhere classified (R26.2)     Time: 1696-7893 PT Time Calculation (min) (ACUTE ONLY): 33 min  Charges:  $Gait Training: 8-22 mins $Therapeutic Activity: 8-22 mins                     Adrion Menz A. Dan Humphreys PT, DPT Acute Rehabilitation Services Office [phone]    Viviann Spare 09/12/2022, 12:34 PM

## 2022-09-12 NOTE — Progress Notes (Signed)
Patient alert and oriented  x4 , pt. Void, surgical site is clean and dry no sign of infections.d/c instructions explain and copy given to the patient and daughter. Pt. D/c home per order.

## 2022-09-12 NOTE — Progress Notes (Signed)
Patient ID: Teresa Martinez, female   DOB: [birth year], 81 y.o.   MRN: [id number] Doing well overall.  Left operative knee stable and dressing is clean and dry.  Notes from therapy also attest to the patient's mobility.  Vitals stable.  Can be discharged to home today.

## 2022-09-12 NOTE — Discharge Summary (Signed)
Patient ID: Teresa Martinez MRN: [id number] DOB/AGE: [birth year] 81 y.o.  Admit date: 09/10/2022 Discharge date: 09/12/2022  Admission Diagnoses:  Principal Problem:   Unilateral primary osteoarthritis, left knee Active Problems:   Status post left knee replacement   Discharge Diagnoses:  Same  Past Medical History:  Diagnosis Date   Cervical spine degeneration    C5/C6   Chronic right shoulder pain    GERD (gastroesophageal reflux disease)    Hemiparesis affecting dominant side as late effect of cerebrovascular accident ([place]) 12/07/2014   Hyperlipemia    Hypertension    Menopausal syndrome    Teresa Martinez)   Overactive bladder    Statin intolerance    Stroke [facility]) 2015    Surgeries: Procedure(s): LEFT TOTAL KNEE ARTHROPLASTY on 09/10/2022   Consultants:   Discharged Condition: Improved  Hospital Course: Teresa Martinez is an 81 y.o. female who was admitted 09/10/2022 for operative treatment ofUnilateral primary osteoarthritis, left knee. Patient has severe unremitting pain that affects sleep, daily activities, and work/hobbies. After pre-op clearance the patient was taken to the operating room on 09/10/2022 and underwent  Procedure(s): LEFT TOTAL KNEE ARTHROPLASTY.    Patient was given perioperative antibiotics:  Anti-infectives (From admission, onward)    Start     Dose/Rate Route Frequency Ordered Stop   09/10/22 1900  ceFAZolin (ANCEF) IVPB 1 g/50 mL premix        1 g 100 mL/hr over 30 Minutes Intravenous Every 6 hours 09/10/22 1720 09/11/22 0105   09/10/22 1000  ceFAZolin (ANCEF) IVPB 2g/100 mL premix        2 g 200 mL/hr over 30 Minutes Intravenous On call to O.R. 09/10/22 0956 09/10/22 1241        Patient was given sequential compression devices, early ambulation, and chemoprophylaxis to prevent DVT.  Patient benefited maximally from hospital stay and there were no complications.    Recent vital signs: Patient Vitals for the  past 24 hrs:  BP Temp Temp src Pulse Resp SpO2  09/12/22 0803 (!) 152/62 99 F (37.2 C) Oral 99 16 96 %  09/12/22 0434 (!) 150/66 100.3 F (37.9 C) Oral 99 20 94 %  09/11/22 2308 (!) 142/56 (!) 100.4 F (38 C) Oral 96 18 100 %  09/11/22 1948 (!) 144/73 99.7 F (37.6 C) Oral 95 18 97 %  09/11/22 1137 (!) 140/67 98.5 F (36.9 C) Oral 90 16 98 %     Recent laboratory studies:  Recent Labs    09/11/22 0642  WBC 10.0  HGB 11.7*  HCT 36.3  PLT 220  NA 139  K 3.9  CL 104  CO2 24  BUN 8  CREATININE 0.83  GLUCOSE 125*  CALCIUM 9.2     Discharge Medications:   Allergies as of 09/12/2022       Reactions   Statins Other (See Comments)   Muscle pain and unable to walk Muscle pain and unable to walk   Wasp Venom    Latex Other (See Comments)   Burns her skin        Medication List     STOP taking these medications    aspirin EC 325 MG tablet Replaced by: aspirin 81 MG chewable tablet       TAKE these medications    aspirin 81 MG chewable tablet Chew 1 tablet (81 mg total) by mouth 2 (two) times daily. Replaces: aspirin EC 325 MG tablet   CALTRATE PLUS PO Take 1 tablet by mouth daily.  ezetimibe 10 MG tablet Commonly known as: ZETIA TAKE 1 TABLET(10 MG) BY MOUTH DAILY   fesoterodine 4 MG Tb24 tablet Commonly known as: TOVIAZ TAKE 1 TABLET(4 MG) BY MOUTH DAILY   lisinopril 10 MG tablet Commonly known as: ZESTRIL TAKE 1 TABLET(10 MG) BY MOUTH DAILY   multivitamin with minerals Tabs tablet Take 1 tablet by mouth daily.   pantoprazole 40 MG tablet Commonly known as: Protonix Take 1 tablet (40 mg total) by mouth daily.   Repatha SureClick 140 MG/ML Soaj Generic drug: Evolocumab INJECT 1 DOSE INTO SKIN EVERY 14(FOURTEEN) DAYS   traMADol 50 MG tablet Commonly known as: ULTRAM Take 1 tablet (50 mg total) by mouth every 6 (six) hours as needed for severe pain.               Durable Medical Equipment  (From admission, onward)            Start     Ordered   09/10/22 1721  DME 3 n 1  Once        09/10/22 1720   09/10/22 1721  DME Walker rolling  Once       Question Answer Comment  Walker: With 5 Inch Wheels   Patient needs a walker to treat with the following condition Status post left knee replacement      09/10/22 1720            Diagnostic Studies: DG Knee Left Port  Result Date: 09/10/2022 CLINICAL DATA:  Status post left knee replacement. EXAM: PORTABLE LEFT KNEE - 1-2 VIEW COMPARISON:  Left knee radiographs 11/26/2021 FINDINGS: Left total knee arthroplasty noted. Components are well seated. The joint is located. Effusion is present. Gas is present in the joint space. Skin staples are in place. Atherosclerotic changes are noted. IMPRESSION: Left total knee arthroplasty without radiographic evidence for complication. Electronically Signed   By: Marin Roberts M.D.   On: 09/10/2022 15:11    Disposition: Discharge disposition: 01-Home or Self Care          Follow-up Information     Kathryne Hitch, MD Follow up in 2 week(s).   Specialty: Orthopedic Surgery Contact information: [address] [place] [address] [phone]         [facility] Follow up.   Specialty: Home Health Services Why: The home health agency will contact you for the first home visit. Contact information: [address] [place] [address] [phone]                  Signed: Kathryne Hitch 09/12/2022, 10:06 AM

## 2022-09-12 NOTE — Plan of Care (Signed)

## 2022-09-12 NOTE — Telephone Encounter (Signed)
Ortho bundle D/C call today after patient discharged from hospital after knee replacement.

## 2022-09-19 ENCOUNTER — Telehealth: Payer: Self-pay | Admitting: *Deleted

## 2022-09-19 NOTE — Telephone Encounter (Signed)
Attempted Ortho bundle 7 day call to patient; no answer and left VM requesting call back. ?

## 2022-09-20 NOTE — Therapy (Addendum)
OUTPATIENT PHYSICAL THERAPY EVALUATION   Patient Name: Teresa Martinez MRN: [id number] DOB:Jan 10, 1941, 81 y.o., female Today's Date: 09/23/2022  END OF SESSION:   PT End of Session - 09/23/22 1431     Visit Number 1    Number of Visits 20    Date for PT Re-Evaluation 12/02/22    Authorization Type [employer] medicare $20 copay    Progress Note Due on Visit 10    PT Start Time 1432    PT Stop Time 1510    PT Time Calculation (min) 38 min    Activity Tolerance Patient tolerated treatment well    Behavior During Therapy WFL for tasks assessed/performed             Past Medical History:  Diagnosis Date   Cervical spine degeneration    C5/C6   Chronic right shoulder pain    GERD (gastroesophageal reflux disease)    Hemiparesis affecting dominant side as late effect of cerebrovascular accident (HCC) 12/07/2014   Hyperlipemia    Hypertension    Menopausal syndrome    [name]   Overactive bladder    Statin intolerance    Stroke (HCC) 2015   Past Surgical History:  Procedure Laterality Date   ABDOMINAL HYSTERECTOMY     1985   BAND HEMORRHOIDECTOMY     2010   CATARACT EXTRACTION, BILATERAL     CHOLECYSTECTOMY     1950   LUMBAR LAMINECTOMY Bilateral 08/03/2018   Dr. Christain Sacramento, L3 laminotomy, L4-L5 laminectomy with neural foraminal decompression.   PAROTIDECTOMY     30 years ago   TOTAL KNEE ARTHROPLASTY Left 09/10/2022   Procedure: LEFT TOTAL KNEE ARTHROPLASTY;  Surgeon: Kathryne Hitch, MD;  Location: [facility] OR;  Service: Orthopedics;  Laterality: Left;   Patient Active Problem List   Diagnosis Date Noted   Status post left knee replacement 09/10/2022   IGT (impaired glucose tolerance) 07/27/2021   Preop testing 07/09/2018   Degenerative lumbar spinal stenosis 05/12/2018   Statin myopathy 08/29/2016   Iliotibial band syndrome of right side 04/23/2016   Acromioclavicular arthrosis 06/30/2015   Left carpal tunnel syndrome 05/15/2015    Adhesive capsulitis of right shoulder 02/06/2015   Dysarthria due to cerebrovascular accident 01/09/2015   Hemiparesis affecting dominant side as late effect of cerebrovascular accident (HCC) 12/07/2014   Spastic neurogenic bladder 10/31/2014   Right rotator cuff tendonitis 10/19/2014   Stenosis of cervical spine region    Chronic right shoulder pain    Cerebral infarction due to thrombosis of left middle cerebral artery (HCC)    Left pontine stroke (HCC)    TIA (transient ischemic attack) 10/14/2014   Overactive bladder 10/14/2014   H/O: CVA (cerebrovascular accident) 10/14/2014   NECK PAIN, CHRONIC 10/29/2010   Essential hypertension 10/10/2009   INSOMNIA 09/15/2009   Pure hypercholesterolemia 11/28/2008   MENOPAUSAL SYNDROME 11/28/2008    PCP: Townsend Roger MD  REFERRING PROVIDER: Kathryne Hitch, MD  REFERRING DIAG: [phone] (ICD-10-CM) - Status post left knee replacement M17.12 (ICD-10-CM) - Unilateral primary osteoarthritis, left knee  THERAPY DIAG:  Chronic pain of left knee  Muscle weakness (generalized)  Difficulty in walking, not elsewhere classified  Localized edema  Rationale for Evaluation and Treatment: Rehabilitation  ONSET DATE: 09/10/2022  SUBJECTIVE:   SUBJECTIVE STATEMENT: Pt came to clinic s/p recent Lt TKA.  Pt indicated sleeping in bed for several hours prior to waking.  Using walker in house.  Has cane.    PERTINENT HISTORY: PMH: CVA with  Rt hemiparesis, HTN, neurogenic bladder  PAIN:  NPRS scale: current 1/10, at worst 10/10 Pain location: Lt knee  Pain description: tightness, stiffness, shooting Aggravating factors: end range pain, getting leg movement.  Relieving factors: avoid pain  PRECAUTIONS: None  WEIGHT BEARING RESTRICTIONS: No  FALLS:  Has patient fallen in last 6 months? 1 fall Do you have a fear of falling?  nope  LIVING ENVIRONMENT: Lives with: primary alone but has children staying at this time.   Lives in: House/apartment Stairs:5 to enter at front, 2 in back with handrail on Lt  Has following equipment at home:FWW, SPC  OCCUPATION: Retired  PLOF: Independent, hobbies - read, exercise routine  PATIENT GOALS: Walking without cane or walker, strengthening.    OBJECTIVE:   PATIENT SURVEYS:  09/23/2022 FOTO intake: 33   predicted:  52  COGNITION: 09/23/2022 Overall cognitive status: WFL    EDEMA:  09/23/2022 Localized edema in Lt knee noted visually.   PALPATION: 09/23/2022 Mild tenderness joint line, posterior knee joint.   LOWER EXTREMITY MMT:   MMT Right 09/23/2022 Left 09/23/2022  Hip flexion 5/5 4/5  Hip extension    Hip abduction    Hip adduction    Hip internal rotation    Hip external rotation    Knee flexion 5/5 4/5  Knee extension 5/5 2/5  Ankle dorsiflexion 5/5 5/5  Ankle plantarflexion    Ankle inversion    Ankle eversion     (Blank rows = not tested)  LOWER EXTREMITY ROM:  ROM Right 09/23/2022 Left 09/23/2022  Hip flexion    Hip extension    Hip abduction    Hip adduction    Hip internal rotation    Hip external rotation    Knee flexion  80 AROM in supine heel slide  Knee extension  -10 AROM in seated LAQ.  -5 in supine quad set AROM  Ankle dorsiflexion    Ankle plantarflexion    Ankle inversion    Ankle eversion     (Blank rows = not tested)   FUNCTIONAL TESTS:  09/23/2022 18 inch chair transfer: unable without UE assist Lt SLS: unable unassisted   GAIT: 09/23/2022 FWW ambulation c reduced stance on Lt leg, maintained knee flexion in stance.    TODAY'S TREATMENT                                                                          DATE:09/23/2022 Therex:    HEP instruction/performance c cues for techniques, handout provided.  Trial set performed of each for comprehension and symptom assessment.  See below for exercise list  Vaso 10 mins Lt knee in elevation medium compression 34 degrees  PATIENT EDUCATION:   09/23/2022 Education details: HEP, POC Person educated: Patient Education method: Consulting civil engineer, Demonstration, Verbal cues, and Handouts Education comprehension: verbalized understanding, returned demonstration, and verbal cues required  HOME EXERCISE PROGRAM: Access Code: [email or web address] URL: [email or web address] Date: 09/23/2022 Prepared by: Scot Jun  Exercises - Seated Long Arc Quad (Mirrored)  - 3-5 x daily - 7 x weekly - 1-2 sets - 10 reps - 2 hold - Supine Heel Slide with Strap  - 3-5 x daily - 7 x weekly - 1-2 sets - 10 reps - 5  hold - Seated Quad Set (Mirrored)  - 3-5 x daily - 7 x weekly - 1 sets - 10 reps - 5 hold - Supine Knee Extension Mobilization with Weight (Mirrored)  - 3-5 x daily - 7 x weekly - 1 sets - 1 reps - to tolerance up to 15 mins hold  ASSESSMENT:  CLINICAL IMPRESSION: Patient is a 81 y.o. who comes to clinic with complaints of Lt knee pain s/p Lt TKA 09/10/2022 with mobility, strength and movement coordination deficits that impair their ability to perform usual daily and recreational functional activities without increase difficulty/symptoms at this time.  Patient to benefit from skilled PT services to address impairments and limitations to improve to previous level of function without restriction secondary to condition.   OBJECTIVE IMPAIRMENTS: Abnormal gait, decreased activity tolerance, decreased balance, decreased endurance, decreased mobility, difficulty walking, decreased ROM, decreased strength, hypomobility, increased edema, impaired flexibility, improper body mechanics, and pain.   ACTIVITY LIMITATIONS: carrying, lifting, bending, sitting, standing, squatting, sleeping, stairs, transfers, bed mobility, bathing, dressing, and locomotion level  PARTICIPATION LIMITATIONS: meal prep, cleaning, laundry, interpersonal relationship, driving, shopping, and community activity  PERSONAL FACTORS:  CVA with Rt hemiparesis, HTN, neurogenic bladder   are also affecting patient's functional outcome.   REHAB POTENTIAL: Good  CLINICAL DECISION MAKING: Stable/uncomplicated  EVALUATION COMPLEXITY: Low   GOALS: Goals reviewed with patient? Yes  SHORT TERM GOALS: (target date for Short term goals are 3 weeks 10/14/2022)   1.  Patient will demonstrate independent use of home exercise program to maintain progress from in clinic treatments.  Goal status: New  LONG TERM GOALS: (target dates for all long term goals are 10 weeks  12/02/2022 )   1. Patient will demonstrate/report pain at worst less than or equal to 2/10 to facilitate minimal limitation in daily activity secondary to pain symptoms.  Goal status: New   2. Patient will demonstrate independent use of home exercise program to facilitate ability to maintain/progress functional gains from skilled physical therapy services.  Goal status: New   3. Patient will demonstrate FOTO outcome > or = 52 % to indicate reduced disability due to condition.  Goal status: New   4.  Patient will demonstrate Lt  LE MMT 5/5 throughout to faciltiate usual transfers, stairs, squatting at [facility] for daily life.   Goal status: New   5.  Patient will demonstrate Lt knee AROM 0-110 deg to facilitate usual transfers, ambulation, and stairs at PLOF s limitation.  Goal status: New   6.  Patient will demonstrate independent ambulation community distances > 300 ft to facilitate community ambulation.  Goal status: New   7.  Patient will demonstrate ascending/descending stairs c reciprocal gait pattern for household navigation.  Goal Status: New   PLAN:  PT FREQUENCY: 2x/week  PT DURATION: 10 weeks  PLANNED INTERVENTIONS: Therapeutic exercises, Therapeutic activity, Neuro Muscular re-education, Balance training, Gait training, Patient/Family education, Joint mobilization, Stair training, DME instructions, Dry Needling, Electrical stimulation, Traction, Cryotherapy, vasopneumatic deviceMoist heat,  Taping, Ultrasound, Ionotophoresis 4mg /ml Dexamethasone, and Manual therapy.  All included unless contraindicated  PLAN FOR NEXT SESSION: Review HEP knowledge/results.   Progressive quad strengthening, end range mobility gains.    , PT, DPT, OCS, ATC 09/23/22  3:19 PM   09/23/2022 - 10/30/2022 Treatment time Referring diagnosis? 11/01/2022 (ICD-10-CM) - Status post left knee replacement M17.12 (ICD-10-CM) - Unilateral primary osteoarthritis, left knee Treatment diagnosis? (if different than referring diagnosis) M25.562 What was this (referring dx) caused by? [x]  Surgery []   Fall []  Ongoing issue []  Arthritis []  Other: ____________  Laterality: []  Rt [x]  Lt []  Both  Check all possible CPT codes:  *CHOOSE 10 OR LESS*    []  97110 (Therapeutic Exercise)  []  92507 (SLP Treatment)  []  97112 (Neuro Re-ed)   []  92526 (Swallowing Treatment)   []  97116 (Gait Training)   []  (Cognitive Training, 1st 15 minutes) []  97140 (Manual Therapy)   []  97130 (Cognitive Training, each add'l 15 minutes)  []  97164 (Re-evaluation)                              []  Other, List CPT Code ____________  []  97530 (Therapeutic Activities)     []  97535 (Self Care)   [x]  All codes above (97110 - 97535)  []  97012 (Mechanical Traction)  [x]  97014 (E-stim Unattended)  []  97032 (E-stim manual)  []  97033 (Ionto)  []  (Ultrasound) [x]  97750 (Physical Performance Training) []  (Aquatic Therapy) [x]  97016 (Vasopneumatic Device) []  (Paraffin) []  97034 (Contrast Bath) []  97597 (Wound Care 1st 20 sq cm) []  97598 (Wound Care each add'l 20 sq cm) []  97760 (Orthotic Fabrication, Fitting, Training Initial) []  (Prosthetic Management and Training Initial) []  (Orthotic or Prosthetic Training/ Modification Subsequent)

## 2022-09-23 ENCOUNTER — Encounter: Payer: Self-pay | Admitting: Rehabilitative and Restorative Service Providers"

## 2022-09-23 ENCOUNTER — Encounter: Payer: Self-pay | Admitting: Orthopaedic Surgery

## 2022-09-23 ENCOUNTER — Telehealth: Payer: Self-pay | Admitting: *Deleted

## 2022-09-23 ENCOUNTER — Ambulatory Visit (INDEPENDENT_AMBULATORY_CARE_PROVIDER_SITE_OTHER): Payer: Medicare PPO | Admitting: Orthopaedic Surgery

## 2022-09-23 ENCOUNTER — Other Ambulatory Visit: Payer: Self-pay

## 2022-09-23 ENCOUNTER — Ambulatory Visit (INDEPENDENT_AMBULATORY_CARE_PROVIDER_SITE_OTHER): Payer: Medicare PPO | Admitting: Rehabilitative and Restorative Service Providers"

## 2022-09-23 DIAGNOSIS — R262 Difficulty in walking, not elsewhere classified: Secondary | ICD-10-CM

## 2022-09-23 DIAGNOSIS — G8929 Other chronic pain: Secondary | ICD-10-CM | POA: Diagnosis not present

## 2022-09-23 DIAGNOSIS — M6281 Muscle weakness (generalized): Secondary | ICD-10-CM

## 2022-09-23 DIAGNOSIS — R600 Localized edema: Secondary | ICD-10-CM | POA: Diagnosis not present

## 2022-09-23 DIAGNOSIS — Z96652 Presence of left artificial knee joint: Secondary | ICD-10-CM

## 2022-09-23 DIAGNOSIS — M25562 Pain in left knee: Secondary | ICD-10-CM | POA: Diagnosis not present

## 2022-09-23 MED ORDER — METHOCARBAMOL 500 MG PO TABS: 500.0000 mg | ORAL_TABLET | Freq: Four times a day (QID) | ORAL | 1 refills | Status: DC | PRN

## 2022-09-23 NOTE — Telephone Encounter (Signed)
Ortho bundle 14 day in office meeting completed. °

## 2022-09-23 NOTE — Progress Notes (Signed)
The patient is an 81 year old female who is here today for first postoperative visit status post a left total knee arthroplasty.  She has been participating in home health therapy.  She starts outpatient therapy this afternoon.  The notes from therapy stated her range of motion passively is 0 to 87 degrees.  Her left knee incision looks great.  The staples are removed and Steri-Strips applied.  There is swelling to be expected.  She does have good range of motion per the home health therapy notes.  Her calf is soft.  There is no swelling in her feet and ankle.  She has been compliant with wearing TED hose and taking a baby aspirin twice daily.  She was on a baby aspirin once daily prior to surgery and can go back to this.  She will start outpatient physical therapy this afternoon and we will see her back in 4 weeks to see how she is doing overall but no x-rays are needed.  I will send in some Robaxin per her daughter's request.  She does not tolerate other pain medications at all.

## 2022-09-26 ENCOUNTER — Ambulatory Visit (INDEPENDENT_AMBULATORY_CARE_PROVIDER_SITE_OTHER): Payer: Medicare PPO | Admitting: Rehabilitative and Restorative Service Providers"

## 2022-09-26 ENCOUNTER — Encounter: Payer: Self-pay | Admitting: *Deleted

## 2022-09-26 ENCOUNTER — Encounter: Payer: Self-pay | Admitting: Rehabilitative and Restorative Service Providers"

## 2022-09-26 DIAGNOSIS — R600 Localized edema: Secondary | ICD-10-CM

## 2022-09-26 DIAGNOSIS — M25562 Pain in left knee: Secondary | ICD-10-CM

## 2022-09-26 DIAGNOSIS — M6281 Muscle weakness (generalized): Secondary | ICD-10-CM | POA: Diagnosis not present

## 2022-09-26 DIAGNOSIS — G8929 Other chronic pain: Secondary | ICD-10-CM

## 2022-09-26 DIAGNOSIS — R262 Difficulty in walking, not elsewhere classified: Secondary | ICD-10-CM

## 2022-09-26 NOTE — Therapy (Addendum)
OUTPATIENT PHYSICAL THERAPY TREATMENT   Patient Name: Teresa SchaumannFrances H Mintz-Whitcomb MRN: [id number] DOB:10/23/1941, 81 y.o., female Today's Date: 09/26/2022  END OF SESSION:   PT End of Session - 09/26/22 1342     Visit Number 2    Number of Visits 20    Date for PT Re-Evaluation 12/02/22    Authorization Type [employer] medicare $20 copay    Progress Note Due on Visit 10    PT Start Time 1343    PT Stop Time 1433    PT Time Calculation (min) 50 min    Activity Tolerance Patient tolerated treatment well    Behavior During Therapy WFL for tasks assessed/performed              Past Medical History:  Diagnosis Date   Cervical spine degeneration    C5/C6   Chronic right shoulder pain    GERD (gastroesophageal reflux disease)    Hemiparesis affecting dominant side as late effect of cerebrovascular accident (HCC) 12/07/2014   Hyperlipemia    Hypertension    Menopausal syndrome    [name](Sherry Dickstein)   Overactive bladder    Statin intolerance    Stroke (HCC) 2015   Past Surgical History:  Procedure Laterality Date   ABDOMINAL HYSTERECTOMY     1985   BAND HEMORRHOIDECTOMY     2010   CATARACT EXTRACTION, BILATERAL     CHOLECYSTECTOMY     1950   LUMBAR LAMINECTOMY Bilateral 08/03/2018   Dr. Christain SacramentoBirkedal, L3 laminotomy, L4-L5 laminectomy with neural foraminal decompression.   PAROTIDECTOMY     30 years ago   TOTAL KNEE ARTHROPLASTY Left 09/10/2022   Procedure: LEFT TOTAL KNEE ARTHROPLASTY;  Surgeon: Kathryne HitchBlackman, Christopher Y, MD;  Location: [facility] OR;  Service: Orthopedics;  Laterality: Left;   Patient Active Problem List   Diagnosis Date Noted   Status post left knee replacement 09/10/2022   IGT (impaired glucose tolerance) 07/27/2021   Preop testing 07/09/2018   Degenerative lumbar spinal stenosis 05/12/2018   Statin myopathy 08/29/2016   Iliotibial band syndrome of right side 04/23/2016   Acromioclavicular arthrosis 06/30/2015   Left carpal tunnel syndrome 05/15/2015    Adhesive capsulitis of right shoulder 02/06/2015   Dysarthria due to cerebrovascular accident 01/09/2015   Hemiparesis affecting dominant side as late effect of cerebrovascular accident (HCC) 12/07/2014   Spastic neurogenic bladder 10/31/2014   Right rotator cuff tendonitis 10/19/2014   Stenosis of cervical spine region    Chronic right shoulder pain    Cerebral infarction due to thrombosis of left middle cerebral artery (HCC)    Left pontine stroke (HCC)    TIA (transient ischemic attack) 10/14/2014   Overactive bladder 10/14/2014   H/O: CVA (cerebrovascular accident) 10/14/2014   NECK PAIN, CHRONIC 10/29/2010   Essential hypertension 10/10/2009   INSOMNIA 09/15/2009   Pure hypercholesterolemia 11/28/2008   MENOPAUSAL SYNDROME 11/28/2008    PCP: Townsend RogerHernandex Acosta, Estela MD  REFERRING PROVIDER: Kathryne HitchBlackman, Christopher Y, MD  REFERRING DIAG: [phone] (ICD-10-CM) - Status post left knee replacement M17.12 (ICD-10-CM) - Unilateral primary osteoarthritis, left knee  THERAPY DIAG:  Chronic pain of left knee  Muscle weakness (generalized)  Difficulty in walking, not elsewhere classified  Localized edema  Rationale for Evaluation and Treatment: Rehabilitation  ONSET DATE: 09/10/2022  SUBJECTIVE:   SUBJECTIVE STATEMENT: Pt indicated feeling stiff upon arrival today.  Pt indicated yesterday wasn't a good day.   PERTINENT HISTORY: PMH: CVA with Rt hemiparesis, HTN, neurogenic bladder  PAIN:  NPRS scale: 2/10 upon arrival  Pain location: Lt knee  Pain description: achy tightness, stiffness, shooting Aggravating factors: end range pain, getting leg movement.  Relieving factors: avoid pain  PRECAUTIONS: None  WEIGHT BEARING RESTRICTIONS: No  FALLS:  Has patient fallen in last 6 months? 1 fall Do you have a fear of falling?  nope  LIVING ENVIRONMENT: Lives with: primary alone but has children staying at this time.  Lives in: House/apartment Stairs:5 to enter at front, 2  in back with handrail on Lt  Has following equipment at home:FWW, SPC  OCCUPATION: Retired  PLOF: Independent, hobbies - read, exercise routine  PATIENT GOALS: Walking without cane or walker, strengthening.    OBJECTIVE:   PATIENT SURVEYS:  09/23/2022 FOTO intake: 33   predicted:  52  COGNITION: 09/23/2022 Overall cognitive status: WFL    EDEMA:  09/23/2022 Localized edema in Lt knee noted visually.   PALPATION: 09/23/2022 Mild tenderness joint line, posterior knee joint.   LOWER EXTREMITY MMT:   MMT Right 09/23/2022 Left 09/23/2022  Hip flexion 5/5 4/5  Hip extension    Hip abduction    Hip adduction    Hip internal rotation    Hip external rotation    Knee flexion 5/5 4/5  Knee extension 5/5 2/5  Ankle dorsiflexion 5/5 5/5  Ankle plantarflexion    Ankle inversion    Ankle eversion     (Blank rows = not tested)  LOWER EXTREMITY ROM:  ROM Right 09/23/2022 Left 09/23/2022  Hip flexion    Hip extension    Hip abduction    Hip adduction    Hip internal rotation    Hip external rotation    Knee flexion  80 AROM in supine heel slide  Knee extension  -10 AROM in seated LAQ.  -5 in supine quad set AROM  Ankle dorsiflexion    Ankle plantarflexion    Ankle inversion    Ankle eversion     (Blank rows = not tested)   FUNCTIONAL TESTS:  09/23/2022 18 inch chair transfer: unable without UE assist Lt SLS: unable unassisted   GAIT: 09/23/2022 FWW ambulation c reduced stance on Lt leg, maintained knee flexion in stance.    TODAY'S TREATMENT                                                                          DATE:09/26/2022 Therex: Nustep Lvl 5 8 mins UE/LE , seat 8 Incline bilateral gastroc stretch 30 sec x 3 Seated Lt leg quad set 5 sec hold x 10 Seated AROM LAQ c pause in end ranges x 20 Seated alternating isometric flexion/extension in mid range Lt knee 5 sec each x 12 Sit to stand to sit no UE assist 23 inch table height x 10   Neuro Re-ed Tandem  stance c SBA to occasional min A 1 min x 1 bilaterally [place] pew anterior/posterior weight shifting 2 mins c SBA for ankle strategy  Manual: Seated Lt knee flexion c distraction/IR mobilization c movement for mobiltiy gains   Vaso 10 mins Lt knee in elevation medium compression 34 degrees  TODAY'S TREATMENT  DATE:09/23/2022 Therex:    HEP instruction/performance c cues for techniques, handout provided.  Trial set performed of each for comprehension and symptom assessment.  See below for exercise list  Vaso 10 mins Lt knee in elevation medium compression 34 degrees  PATIENT EDUCATION:  09/23/2022 Education details: HEP, POC Person educated: Patient Education method: Consulting civil engineer, Demonstration, Verbal cues, and Handouts Education comprehension: verbalized understanding, returned demonstration, and verbal cues required  HOME EXERCISE PROGRAM: Access Code: [email or web address] URL: [email or web address] Date: 09/23/2022 Prepared by: Scot Jun  Exercises - Seated Long Arc Quad (Mirrored)  - 3-5 x daily - 7 x weekly - 1-2 sets - 10 reps - 2 hold - Supine Heel Slide with Strap  - 3-5 x daily - 7 x weekly - 1-2 sets - 10 reps - 5 hold - Seated Quad Set (Mirrored)  - 3-5 x daily - 7 x weekly - 1 sets - 10 reps - 5 hold - Supine Knee Extension Mobilization with Weight (Mirrored)  - 3-5 x daily - 7 x weekly - 1 sets - 1 reps - to tolerance up to 15 mins hold  ASSESSMENT:  CLINICAL IMPRESSION: Good recall of HEP.  Early improvement in quality of knee flexion today.    OBJECTIVE IMPAIRMENTS: Abnormal gait, decreased activity tolerance, decreased balance, decreased endurance, decreased mobility, difficulty walking, decreased ROM, decreased strength, hypomobility, increased edema, impaired flexibility, improper body mechanics, and pain.   ACTIVITY LIMITATIONS: carrying, lifting, bending, sitting, standing,  squatting, sleeping, stairs, transfers, bed mobility, bathing, dressing, and locomotion level  PARTICIPATION LIMITATIONS: meal prep, cleaning, laundry, interpersonal relationship, driving, shopping, and community activity  PERSONAL FACTORS:  CVA with Rt hemiparesis, HTN, neurogenic bladder  are also affecting patient's functional outcome.   REHAB POTENTIAL: Good  CLINICAL DECISION MAKING: Stable/uncomplicated  EVALUATION COMPLEXITY: Low   GOALS: Goals reviewed with patient? Yes  SHORT TERM GOALS: (target date for Short term goals are 3 weeks 10/14/2022)   1.  Patient will demonstrate independent use of home exercise program to maintain progress from in clinic treatments.  Goal status: on going - assessed 09/26/2022  LONG TERM GOALS: (target dates for all long term goals are 10 weeks  12/02/2022 )   1. Patient will demonstrate/report pain at worst less than or equal to 2/10 to facilitate minimal limitation in daily activity secondary to pain symptoms.  Goal status: New   2. Patient will demonstrate independent use of home exercise program to facilitate ability to maintain/progress functional gains from skilled physical therapy services.  Goal status: New   3. Patient will demonstrate FOTO outcome > or = 52 % to indicate reduced disability due to condition.  Goal status: New   4.  Patient will demonstrate Lt  LE MMT 5/5 throughout to faciltiate usual transfers, stairs, squatting at [facility] for daily life.   Goal status: New   5.  Patient will demonstrate Lt knee AROM 0-110 deg to facilitate usual transfers, ambulation, and stairs at PLOF s limitation.  Goal status: New   6.  Patient will demonstrate independent ambulation community distances > 300 ft to facilitate community ambulation.  Goal status: New   7.  Patient will demonstrate ascending/descending stairs c reciprocal gait pattern for household navigation.  Goal Status: New   PLAN:  PT FREQUENCY: 2x/week  PT  DURATION: 10 weeks  PLANNED INTERVENTIONS: Therapeutic exercises, Therapeutic activity, Neuro Muscular re-education, Balance training, Gait training, Patient/Family education, Joint mobilization, Stair training, DME instructions, Dry Needling, Electrical stimulation, Traction, Cryotherapy, vasopneumatic deviceMoist heat, Taping,  Ultrasound, Ionotophoresis 4mg /ml Dexamethasone, and Manual therapy.  All included unless contraindicated  PLAN FOR NEXT SESSION: Early static balance, strengthening as tolerated.  Vaso.    , PT, DPT, OCS, ATC 09/26/22  2:25 PM

## 2022-09-26 NOTE — Progress Notes (Deleted)
   September 26, 2022    To whom it may concern:  This letter is on behalf of Teresa Martinez, who is currently with her mother, Lochlyn Mintz-Whitcomb assisting with care after total knee replacement done on 08/11/22 with Dr. Doneen Poisson.   [name] is currently assisting her mother with ADLs, meals, cleaning/housework, driving, etc while recovering from her surgery.   Please extend her leave of absence for the dates 10/07/22-10/20/22 to assist with care of her mother.    Thank you,  Dr. Maureen Ralphs

## 2022-10-01 ENCOUNTER — Ambulatory Visit (INDEPENDENT_AMBULATORY_CARE_PROVIDER_SITE_OTHER): Payer: Medicare PPO | Admitting: Rehabilitative and Restorative Service Providers"

## 2022-10-01 ENCOUNTER — Encounter: Payer: Self-pay | Admitting: Rehabilitative and Restorative Service Providers"

## 2022-10-01 DIAGNOSIS — R262 Difficulty in walking, not elsewhere classified: Secondary | ICD-10-CM | POA: Diagnosis not present

## 2022-10-01 DIAGNOSIS — M6281 Muscle weakness (generalized): Secondary | ICD-10-CM | POA: Diagnosis not present

## 2022-10-01 DIAGNOSIS — R600 Localized edema: Secondary | ICD-10-CM | POA: Diagnosis not present

## 2022-10-01 DIAGNOSIS — M25562 Pain in left knee: Secondary | ICD-10-CM | POA: Diagnosis not present

## 2022-10-01 DIAGNOSIS — G8929 Other chronic pain: Secondary | ICD-10-CM

## 2022-10-01 NOTE — Therapy (Signed)
OUTPATIENT PHYSICAL THERAPY TREATMENT   Patient Name: Teresa Martinez MRN: [id number] DOB:[birth year], 81 y.o., female Today's Date: 10/01/2022  END OF SESSION:   PT End of Session - 10/01/22 1153     Visit Number 3    Number of Visits 20    Date for PT Re-Evaluation 12/02/22    Authorization Type [employer] medicare $20 copay    Progress Note Due on Visit 10    PT Start Time 1142    PT Stop Time 1232    PT Time Calculation (min) 50 min    Activity Tolerance Patient tolerated treatment well    Behavior During Therapy WFL for tasks assessed/performed               Past Medical History:  Diagnosis Date   Cervical spine degeneration    C5/C6   Chronic right shoulder pain    GERD (gastroesophageal reflux disease)    Hemiparesis affecting dominant side as late effect of cerebrovascular accident (HCC) 12/07/2014   Hyperlipemia    Hypertension    Menopausal syndrome    [name]   Overactive bladder    Statin intolerance    Stroke (HCC) 2015   Past Surgical History:  Procedure Laterality Date   ABDOMINAL HYSTERECTOMY     1985   BAND HEMORRHOIDECTOMY     2010   CATARACT EXTRACTION, BILATERAL     CHOLECYSTECTOMY     1950   LUMBAR LAMINECTOMY Bilateral 08/03/2018   Dr. Christain Sacramento, L3 laminotomy, L4-L5 laminectomy with neural foraminal decompression.   PAROTIDECTOMY     30 years ago   TOTAL KNEE ARTHROPLASTY Left 09/10/2022   Procedure: LEFT TOTAL KNEE ARTHROPLASTY;  Surgeon: Kathryne Hitch, MD;  Location: [facility] OR;  Service: Orthopedics;  Laterality: Left;   Patient Active Problem List   Diagnosis Date Noted   Status post left knee replacement 09/10/2022   IGT (impaired glucose tolerance) 07/27/2021   Preop testing 07/09/2018   Degenerative lumbar spinal stenosis 05/12/2018   Statin myopathy 08/29/2016   Iliotibial band syndrome of right side 04/23/2016   Acromioclavicular arthrosis 06/30/2015   Left carpal tunnel syndrome 05/15/2015    Adhesive capsulitis of right shoulder 02/06/2015   Dysarthria due to cerebrovascular accident 01/09/2015   Hemiparesis affecting dominant side as late effect of cerebrovascular accident (HCC) 12/07/2014   Spastic neurogenic bladder 10/31/2014   Right rotator cuff tendonitis 10/19/2014   Stenosis of cervical spine region    Chronic right shoulder pain    Cerebral infarction due to thrombosis of left middle cerebral artery (HCC)    Left pontine stroke (HCC)    TIA (transient ischemic attack) 10/14/2014   Overactive bladder 10/14/2014   H/O: CVA (cerebrovascular accident) 10/14/2014   NECK PAIN, CHRONIC 10/29/2010   Essential hypertension 10/10/2009   INSOMNIA 09/15/2009   Pure hypercholesterolemia 11/28/2008   MENOPAUSAL SYNDROME 11/28/2008    PCP: Townsend Roger MD  REFERRING PROVIDER: Kathryne Hitch, MD  REFERRING DIAG: [phone] (ICD-10-CM) - Status post left knee replacement M17.12 (ICD-10-CM) - Unilateral primary osteoarthritis, left knee  THERAPY DIAG:  Chronic pain of left knee  Muscle weakness (generalized)  Difficulty in walking, not elsewhere classified  Localized edema  Rationale for Evaluation and Treatment: Rehabilitation  ONSET DATE: 09/10/2022  SUBJECTIVE:   SUBJECTIVE STATEMENT: Pt indicated feeling stiff upon arrival today.  Pt indicated yesterday wasn't a good day.   PERTINENT HISTORY: PMH: CVA with Rt hemiparesis, HTN, neurogenic bladder  PAIN:  NPRS scale: 2/10 upon  arrival Pain location: Lt knee  Pain description: achy tightness, stiffness, shooting Aggravating factors: end range pain, getting leg movement.  Relieving factors: avoid pain  PRECAUTIONS: None  WEIGHT BEARING RESTRICTIONS: No  FALLS:  Has patient fallen in last 6 months? 1 fall Do you have a fear of falling?  nope  LIVING ENVIRONMENT: Lives with: primary alone but has children staying at this time.  Lives in: House/apartment Stairs:5 to enter at front, 2  in back with handrail on Lt  Has following equipment at home:FWW, SPC  OCCUPATION: Retired  PLOF: Independent, hobbies - read, exercise routine  PATIENT GOALS: Walking without cane or walker, strengthening.    OBJECTIVE:   PATIENT SURVEYS:  09/23/2022 FOTO intake: 33   predicted:  52  COGNITION: 09/23/2022 Overall cognitive status: WFL    EDEMA:  09/23/2022 Localized edema in Lt knee noted visually.   PALPATION: 09/23/2022 Mild tenderness joint line, posterior knee joint.   LOWER EXTREMITY MMT:   MMT Right 09/23/2022 Left 09/23/2022  Hip flexion 5/5 4/5  Hip extension    Hip abduction    Hip adduction    Hip internal rotation    Hip external rotation    Knee flexion 5/5 4/5  Knee extension 5/5 2/5  Ankle dorsiflexion 5/5 5/5  Ankle plantarflexion    Ankle inversion    Ankle eversion     (Blank rows = not tested)  LOWER EXTREMITY ROM:  ROM Right 09/23/2022 Left 09/23/2022 Left 10/01/2022  Hip flexion     Hip extension     Hip abduction     Hip adduction     Hip internal rotation     Hip external rotation     Knee flexion  80 AROM in supine heel slide 91 AROM in supine heel slide  Knee extension  -10 AROM in seated LAQ.  -5 in supine quad set AROM   Ankle dorsiflexion     Ankle plantarflexion     Ankle inversion     Ankle eversion      (Blank rows = not tested)   FUNCTIONAL TESTS:  09/23/2022 18 inch chair transfer: unable without UE assist Lt SLS: unable unassisted   GAIT: 09/23/2022 FWW ambulation c reduced stance on Lt leg, maintained knee flexion in stance.    TODAY'S TREATMENT                                                                          DATE:10/01/2022 Therex: Nustep Lvl 5 8 mins UE/LE , seat 8 Leg press Double leg 50 lbs x 15, Single leg x 15 25 lbs  Incline bilateral gastroc stretch 30 sec x 3 Seated AROM LAQ c pause in end ranges x 10, 2 lbs x 15 Seated alternating isometric flexion/extension in mid range Lt knee 5 sec each  x 12 Sit to stand to sit no UE assist 23 inch table height x 10   Manual: Seated Lt knee flexion c distraction/IR mobilization c movement for mobiltiy gains.  Contract/relax to Lt quad for flexion   Vaso 10 mins Lt knee in elevation medium compression 34 degrees  TODAY'S TREATMENT  DATE:09/26/2022 Therex: Nustep Lvl 5 8 mins UE/LE , seat 8 Incline bilateral gastroc stretch 30 sec x 3 Seated Lt leg quad set 5 sec hold x 10 Seated AROM LAQ c pause in end ranges x 20 Seated alternating isometric flexion/extension in mid range Lt knee 5 sec each x 12 Sit to stand to sit no UE assist 23 inch table height x 10   Neuro Re-ed Tandem stance c SBA to occasional min A 1 min x 1 bilaterally [place] pew anterior/posterior weight shifting 2 mins c SBA for ankle strategy  Manual: Seated Lt knee flexion c distraction/IR mobilization c movement for mobiltiy gains   Vaso 10 mins Lt knee in elevation medium compression 34 degrees  TODAY'S TREATMENT                                                                          DATE:09/23/2022 Therex:    HEP instruction/performance c cues for techniques, handout provided.  Trial set performed of each for comprehension and symptom assessment.  See below for exercise list  Vaso 10 mins Lt knee in elevation medium compression 34 degrees  PATIENT EDUCATION:  09/23/2022 Education details: HEP, POC Person educated: Patient Education method: Programmer, multimedia, Demonstration, Verbal cues, and Handouts Education comprehension: verbalized understanding, returned demonstration, and verbal cues required  HOME EXERCISE PROGRAM: Access Code: [email or web address] URL: [email or web address] Date: 09/23/2022 Prepared by: Chyrel Masson  Exercises - Seated Long Arc Quad (Mirrored)  - 3-5 x daily - 7 x weekly - 1-2 sets - 10 reps - 2 hold - Supine Heel Slide with Strap  - 3-5 x daily - 7 x weekly  - 1-2 sets - 10 reps - 5 hold - Seated Quad Set (Mirrored)  - 3-5 x daily - 7 x weekly - 1 sets - 10 reps - 5 hold - Supine Knee Extension Mobilization with Weight (Mirrored)  - 3-5 x daily - 7 x weekly - 1 sets - 1 reps - to tolerance up to 15 mins hold  ASSESSMENT:  CLINICAL IMPRESSION: Quality of flexion improving.  Mild resistance noted in movement.  Extension tightness more evident at this time.  Cues given for routine use of bending and extension stretching at home throughout the day.   OBJECTIVE IMPAIRMENTS: Abnormal gait, decreased activity tolerance, decreased balance, decreased endurance, decreased mobility, difficulty walking, decreased ROM, decreased strength, hypomobility, increased edema, impaired flexibility, improper body mechanics, and pain.   ACTIVITY LIMITATIONS: carrying, lifting, bending, sitting, standing, squatting, sleeping, stairs, transfers, bed mobility, bathing, dressing, and locomotion level  PARTICIPATION LIMITATIONS: meal prep, cleaning, laundry, interpersonal relationship, driving, shopping, and community activity  PERSONAL FACTORS:  CVA with Rt hemiparesis, HTN, neurogenic bladder  are also affecting patient's functional outcome.   REHAB POTENTIAL: Good  CLINICAL DECISION MAKING: Stable/uncomplicated  EVALUATION COMPLEXITY: Low   GOALS: Goals reviewed with patient? Yes  SHORT TERM GOALS: (target date for Short term goals are 3 weeks 10/14/2022)   1.  Patient will demonstrate independent use of home exercise program to maintain progress from in clinic treatments.  Goal status: on going - assessed 09/26/2022  LONG TERM GOALS: (target dates for all long term goals are 10 weeks  12/02/2022 )   1. Patient will demonstrate/report  pain at worst less than or equal to 2/10 to facilitate minimal limitation in daily activity secondary to pain symptoms.  Goal status: New   2. Patient will demonstrate independent use of home exercise program to facilitate  ability to maintain/progress functional gains from skilled physical therapy services.  Goal status: New   3. Patient will demonstrate FOTO outcome > or = 52 % to indicate reduced disability due to condition.  Goal status: New   4.  Patient will demonstrate Lt  LE MMT 5/5 throughout to faciltiate usual transfers, stairs, [facility] for daily life.   Goal status: New   5.  Patient will demonstrate Lt knee AROM 0-110 deg to facilitate usual transfers, ambulation, and stairs at PLOF s limitation.  Goal status: New   6.  Patient will demonstrate independent ambulation community distances > 300 ft to facilitate community ambulation.  Goal status: New   7.  Patient will demonstrate ascending/descending stairs c reciprocal gait pattern for household navigation.  Goal Status: New   PLAN:  PT FREQUENCY: 2x/week  PT DURATION: 10 weeks  PLANNED INTERVENTIONS: Therapeutic exercises, Therapeutic activity, Neuro Muscular re-education, Balance training, Gait training, Patient/Family education, Joint mobilization, Stair training, DME instructions, Dry Needling, Electrical stimulation, Traction, Cryotherapy, vasopneumatic deviceMoist heat, Taping, Ultrasound, Ionotophoresis 4mg /ml Dexamethasone, and Manual therapy.  All included unless contraindicated  PLAN FOR NEXT SESSION: Manual for mobility, strengthening, static balance.   Vaso.   SPC introduction in clinic soon when appropriate.    Chyrel MassonMichael Illiana Losurdo, PT, DPT, OCS, ATC 10/01/22  12:44 PM

## 2022-10-03 ENCOUNTER — Encounter: Payer: Self-pay | Admitting: Rehabilitative and Restorative Service Providers"

## 2022-10-03 ENCOUNTER — Ambulatory Visit (INDEPENDENT_AMBULATORY_CARE_PROVIDER_SITE_OTHER): Payer: Medicare PPO | Admitting: Rehabilitative and Restorative Service Providers"

## 2022-10-03 DIAGNOSIS — M25562 Pain in left knee: Secondary | ICD-10-CM | POA: Diagnosis not present

## 2022-10-03 DIAGNOSIS — R262 Difficulty in walking, not elsewhere classified: Secondary | ICD-10-CM | POA: Diagnosis not present

## 2022-10-03 DIAGNOSIS — R600 Localized edema: Secondary | ICD-10-CM | POA: Diagnosis not present

## 2022-10-03 DIAGNOSIS — G8929 Other chronic pain: Secondary | ICD-10-CM

## 2022-10-03 DIAGNOSIS — M6281 Muscle weakness (generalized): Secondary | ICD-10-CM

## 2022-10-03 NOTE — Therapy (Signed)
OUTPATIENT PHYSICAL THERAPY TREATMENT   Patient Name: Teresa Martinez MRN: [id number] DOB:Mar 26, 1941, 81 y.o., female Today's Date: 10/03/2022  END OF SESSION:   PT End of Session - 10/03/22 1512     Visit Number 4    Number of Visits 20    Date for PT Re-Evaluation 12/02/22    Authorization Type [employer] medicare $20 copay    Progress Note Due on Visit 10    PT Start Time 1512    PT Stop Time 1602    PT Time Calculation (min) 50 min    Activity Tolerance Patient tolerated treatment well    Behavior During Therapy WFL for tasks assessed/performed                Past Medical History:  Diagnosis Date   Cervical spine degeneration    C5/C6   Chronic right shoulder pain    GERD (gastroesophageal reflux disease)    Hemiparesis affecting dominant side as late effect of cerebrovascular accident (HCC) 12/07/2014   Hyperlipemia    Hypertension    Menopausal syndrome    [name]   Overactive bladder    Statin intolerance    Stroke (HCC) 2015   Past Surgical History:  Procedure Laterality Date   ABDOMINAL HYSTERECTOMY     1985   BAND HEMORRHOIDECTOMY     2010   CATARACT EXTRACTION, BILATERAL     CHOLECYSTECTOMY     1950   LUMBAR LAMINECTOMY Bilateral 08/03/2018   Dr. Christain [place], L3 laminotomy, L4-L5 laminectomy with neural foraminal decompression.   PAROTIDECTOMY     30 years ago   TOTAL KNEE ARTHROPLASTY Left 09/10/2022   Procedure: LEFT TOTAL KNEE ARTHROPLASTY;  Surgeon: Kathryne Hitch, MD;  Location: [facility] OR;  Service: Orthopedics;  Laterality: Left;   Patient Active Problem List   Diagnosis Date Noted   Status post left knee replacement 09/10/2022   IGT (impaired glucose tolerance) 07/27/2021   Preop testing 07/09/2018   Degenerative lumbar spinal stenosis 05/12/2018   Statin myopathy 08/29/2016   Iliotibial band syndrome of right side 04/23/2016   Acromioclavicular arthrosis 06/30/2015   Left carpal tunnel syndrome 05/15/2015    Adhesive capsulitis of right shoulder 02/06/2015   Dysarthria due to cerebrovascular accident 01/09/2015   Hemiparesis affecting dominant side as late effect of cerebrovascular accident (HCC) 12/07/2014   Spastic neurogenic bladder 10/31/2014   Right rotator cuff tendonitis 10/19/2014   Stenosis of cervical spine region    Chronic right shoulder pain    Cerebral infarction due to thrombosis of left middle cerebral artery (HCC)    Left pontine stroke (HCC)    TIA (transient ischemic attack) 10/14/2014   Overactive bladder 10/14/2014   H/O: CVA (cerebrovascular accident) 10/14/2014   NECK PAIN, CHRONIC 10/29/2010   Essential hypertension 10/10/2009   INSOMNIA 09/15/2009   Pure hypercholesterolemia 11/28/2008   MENOPAUSAL SYNDROME 11/28/2008    PCP: Townsend Roger MD  REFERRING PROVIDER: Kathryne Hitch, MD  REFERRING DIAG: [phone] (ICD-10-CM) - Status post left knee replacement M17.12 (ICD-10-CM) - Unilateral primary osteoarthritis, left knee  THERAPY DIAG:  Chronic pain of left knee  Muscle weakness (generalized)  Difficulty in walking, not elsewhere classified  Localized edema  Rationale for Evaluation and Treatment: Rehabilitation  ONSET DATE: 09/10/2022  SUBJECTIVE:   SUBJECTIVE STATEMENT: Pt indicated feeling about 4/10 today at worst.  Noticed a little more with exercise than at rest.  Wants to get better sleeping.   PERTINENT HISTORY: PMH: CVA with Rt hemiparesis,  HTN, neurogenic bladder  PAIN:  NPRS scale: 4/10 upon arrival Pain location: Lt knee  Pain description: achy tightness, stiffness, shooting Aggravating factors: end range pain, getting leg movement.  Relieving factors: avoid pain  PRECAUTIONS: None  WEIGHT BEARING RESTRICTIONS: No  FALLS:  Has patient fallen in last 6 months? 1 fall Do you have a fear of falling?  nope  LIVING ENVIRONMENT: Lives with: primary alone but has children staying at this time.  Lives in:  House/apartment Stairs:5 to enter at front, 2 in back with handrail on Lt  Has following equipment at home:FWW, SPC  OCCUPATION: Retired  PLOF: Independent, hobbies - read, exercise routine  PATIENT GOALS: Walking without cane or walker, strengthening.    OBJECTIVE:   PATIENT SURVEYS:  09/23/2022 FOTO intake: 33   predicted:  52  COGNITION: 09/23/2022 Overall cognitive status: WFL    EDEMA:  09/23/2022 Localized edema in Lt knee noted visually.   PALPATION: 09/23/2022 Mild tenderness joint line, posterior knee joint.   LOWER EXTREMITY MMT:   MMT Right 09/23/2022 Left 09/23/2022  Hip flexion 5/5 4/5  Hip extension    Hip abduction    Hip adduction    Hip internal rotation    Hip external rotation    Knee flexion 5/5 4/5  Knee extension 5/5 2/5  Ankle dorsiflexion 5/5 5/5  Ankle plantarflexion    Ankle inversion    Ankle eversion     (Blank rows = not tested)  LOWER EXTREMITY ROM:  ROM Right 09/23/2022 Left 09/23/2022 Left 10/01/2022  Hip flexion     Hip extension     Hip abduction     Hip adduction     Hip internal rotation     Hip external rotation     Knee flexion  80 AROM in supine heel slide 91 AROM in supine heel slide  Knee extension  -10 AROM in seated LAQ.  -5 in supine quad set AROM   Ankle dorsiflexion     Ankle plantarflexion     Ankle inversion     Ankle eversion      (Blank rows = not tested)   FUNCTIONAL TESTS:  09/23/2022 18 inch chair transfer: unable without UE assist Lt SLS: unable unassisted   GAIT: 09/23/2022 FWW ambulation c reduced stance on Lt leg, maintained knee flexion in stance.    TODAY'S TREATMENT                                                                          DATE:10/03/2022 Therex: Nustep Lvl 6 8 mins UE/LE , seat 7 Leg press Double leg 50 lbs x 15, Single leg x 15 25 lbs  Incline bilateral gastroc stretch 30 sec x 3 Seated AROM LAQ c pause in end ranges x 20 3 lbs  Supine quad set 5 sec hold x  15  Gait Training 2 inch step over foot clearing c fwd step/retro step for improved WB in ambulation, single hand Rt on bar c CGA Fwd/reverse ambulation // bars c cues for larger step lengths, CGA  SPC use in clinic 50 ft x 2 c CGA and brief cues for techniques.   Manual: Seated Lt knee flexion c distraction/IR mobilization c movement for  mobiltiy gains.  Contract/relax to Lt quad for flexion.  Attempted percussive device to quad , tolerated poor to fair so only used for 1-2 minutes due to tenderness in quad.    Vaso 10 mins Lt knee in elevation medium compression 34 degrees  TODAY'S TREATMENT                                                                          DATE:10/01/2022 Therex: Nustep Lvl 5 8 mins UE/LE , seat 8 Leg press Double leg 50 lbs x 15, Single leg x 15 25 lbs  Incline bilateral gastroc stretch 30 sec x 3 Seated AROM LAQ c pause in end ranges x 10, 2 lbs x 15 Seated alternating isometric flexion/extension in mid range Lt knee 5 sec each x 12 Sit to stand to sit no UE assist 23 inch table height x 10   Manual: Seated Lt knee flexion c distraction/IR mobilization c movement for mobiltiy gains.  Contract/relax to Lt quad for flexion   Vaso 10 mins Lt knee in elevation medium compression 34 degrees  TODAY'S TREATMENT                                                                          DATE:09/26/2022 Therex: Nustep Lvl 5 8 mins UE/LE , seat 8 Incline bilateral gastroc stretch 30 sec x 3 Seated Lt leg quad set 5 sec hold x 10 Seated AROM LAQ c pause in end ranges x 20 Seated alternating isometric flexion/extension in mid range Lt knee 5 sec each x 12 Sit to stand to sit no UE assist 23 inch table height x 10   Neuro Re-ed Tandem stance c SBA to occasional min A 1 min x 1 bilaterally [place] pew anterior/posterior weight shifting 2 mins c SBA for ankle strategy  Manual: Seated Lt knee flexion c distraction/IR mobilization c movement for mobiltiy  gains   Vaso 10 mins Lt knee in elevation medium compression 34 degrees    PATIENT EDUCATION:  09/23/2022 Education details: HEP, POC Person educated: Patient Education method: Programmer, multimedia, Facilities manager, Verbal cues, and Handouts Education comprehension: verbalized understanding, returned demonstration, and verbal cues required  HOME EXERCISE PROGRAM: Access Code: [email or web address] URL: [email or web address] Date: 09/23/2022 Prepared by: Chyrel Masson  Exercises - Seated Long Arc Quad (Mirrored)  - 3-5 x daily - 7 x weekly - 1-2 sets - 10 reps - 2 hold - Supine Heel Slide with Strap  - 3-5 x daily - 7 x weekly - 1-2 sets - 10 reps - 5 hold - Seated Quad Set (Mirrored)  - 3-5 x daily - 7 x weekly - 1 sets - 10 reps - 5 hold - Supine Knee Extension Mobilization with Weight (Mirrored)  - 3-5 x daily - 7 x weekly - 1 sets - 1 reps - to tolerance up to 15 mins hold  ASSESSMENT:  CLINICAL IMPRESSION: SPC use in clinic c CGA to SBA during performance on level  surfaces.  Close to transitioning to safety for home use.  Continued focus on improving strength and mobility to continue to improve transfers, ambulation and functional activity.  Skilled PT services indicated at this time.   OBJECTIVE IMPAIRMENTS: Abnormal gait, decreased activity tolerance, decreased balance, decreased endurance, decreased mobility, difficulty walking, decreased ROM, decreased strength, hypomobility, increased edema, impaired flexibility, improper body mechanics, and pain.   ACTIVITY LIMITATIONS: carrying, lifting, bending, sitting, standing, squatting, sleeping, stairs, transfers, bed mobility, bathing, dressing, and locomotion level  PARTICIPATION LIMITATIONS: meal prep, cleaning, laundry, interpersonal relationship, driving, shopping, and community activity  PERSONAL FACTORS:  CVA with Rt hemiparesis, HTN, neurogenic bladder  are also affecting patient's functional outcome.   REHAB POTENTIAL:  Good  CLINICAL DECISION MAKING: Stable/uncomplicated  EVALUATION COMPLEXITY: Low   GOALS: Goals reviewed with patient? Yes  SHORT TERM GOALS: (target date for Short term goals are 3 weeks 10/14/2022)   1.  Patient will demonstrate independent use of home exercise program to maintain progress from in clinic treatments.  Goal status: on going - assessed 09/26/2022  LONG TERM GOALS: (target dates for all long term goals are 10 weeks  12/02/2022 )   1. Patient will demonstrate/report pain at worst less than or equal to 2/10 to facilitate minimal limitation in daily activity secondary to pain symptoms.  Goal status: New   2. Patient will demonstrate independent use of home exercise program to facilitate ability to maintain/progress functional gains from skilled physical therapy services.  Goal status: New   3. Patient will demonstrate FOTO outcome > or = 52 % to indicate reduced disability due to condition.  Goal status: New   4.  Patient will demonstrate Lt  LE MMT 5/5 throughout to faciltiate usual transfers, stairs, squatting at [facility] for daily life.   Goal status: New   5.  Patient will demonstrate Lt knee AROM 0-110 deg to facilitate usual transfers, ambulation, and stairs at PLOF s limitation.  Goal status: New   6.  Patient will demonstrate independent ambulation community distances > 300 ft to facilitate community ambulation.  Goal status: New   7.  Patient will demonstrate ascending/descending stairs c reciprocal gait pattern for household navigation.  Goal Status: New   PLAN:  PT FREQUENCY: 2x/week  PT DURATION: 10 weeks  PLANNED INTERVENTIONS: Therapeutic exercises, Therapeutic activity, Neuro Muscular re-education, Balance training, Gait training, Patient/Family education, Joint mobilization, Stair training, DME instructions, Dry Needling, Electrical stimulation, Traction, Cryotherapy, vasopneumatic deviceMoist heat, Taping, Ultrasound, Ionotophoresis 4mg /ml  Dexamethasone, and Manual therapy.  All included unless contraindicated  PLAN FOR NEXT SESSION: SPC in clinic . Manual for mobility, strengthening, static balance/dynamic balance.    Vaso post treatment.    , PT, DPT, OCS, ATC 10/03/22  3:58 PM

## 2022-10-04 ENCOUNTER — Ambulatory Visit (HOSPITAL_BASED_OUTPATIENT_CLINIC_OR_DEPARTMENT_OTHER): Payer: Medicare PPO | Admitting: Internal Medicine

## 2022-10-07 ENCOUNTER — Ambulatory Visit (INDEPENDENT_AMBULATORY_CARE_PROVIDER_SITE_OTHER): Payer: Medicare PPO | Admitting: Rehabilitative and Restorative Service Providers"

## 2022-10-07 ENCOUNTER — Encounter: Payer: Self-pay | Admitting: Rehabilitative and Restorative Service Providers"

## 2022-10-07 DIAGNOSIS — R262 Difficulty in walking, not elsewhere classified: Secondary | ICD-10-CM | POA: Diagnosis not present

## 2022-10-07 DIAGNOSIS — M6281 Muscle weakness (generalized): Secondary | ICD-10-CM

## 2022-10-07 DIAGNOSIS — R600 Localized edema: Secondary | ICD-10-CM

## 2022-10-07 DIAGNOSIS — G8929 Other chronic pain: Secondary | ICD-10-CM | POA: Diagnosis not present

## 2022-10-07 DIAGNOSIS — M25562 Pain in left knee: Secondary | ICD-10-CM | POA: Diagnosis not present

## 2022-10-07 NOTE — Therapy (Signed)
OUTPATIENT PHYSICAL THERAPY TREATMENT   Patient Name: Teresa Martinez MRN: [id number] DOB:November 21, 1940, 81 y.o., female Today's Date: 10/07/2022  END OF SESSION:   PT End of Session - 10/07/22 1350     Visit Number 5    Number of Visits 20    Date for PT Re-Evaluation 12/02/22    Authorization Type [employer] medicare $20 copay    Progress Note Due on Visit 10    PT Start Time 1696    PT Stop Time 1434    PT Time Calculation (min) 49 min    Activity Tolerance Patient tolerated treatment well    Behavior During Therapy WFL for tasks assessed/performed                 Past Medical History:  Diagnosis Date   Cervical spine degeneration    C5/C6   Chronic right shoulder pain    GERD (gastroesophageal reflux disease)    Hemiparesis affecting dominant side as late effect of cerebrovascular accident ([place]) 12/07/2014   Hyperlipemia    Hypertension    Menopausal syndrome    [name]   Overactive bladder    Statin intolerance    Stroke ([place]) 2015   Past Surgical History:  Procedure Laterality Date   ABDOMINAL HYSTERECTOMY     1985   BAND HEMORRHOIDECTOMY     2010   CATARACT EXTRACTION, BILATERAL     CHOLECYSTECTOMY     1950   LUMBAR LAMINECTOMY Bilateral 08/03/2018   Dr. Octavio Manns, L3 laminotomy, L4-L5 laminectomy with neural foraminal decompression.   PAROTIDECTOMY     30 years ago   TOTAL KNEE ARTHROPLASTY Left 09/10/2022   Procedure: LEFT TOTAL KNEE ARTHROPLASTY;  Surgeon: Mcarthur Rossetti, MD;  Location: [facility];  Service: Orthopedics;  Laterality: Left;   Patient Active Problem List   Diagnosis Date Noted   Status post left knee replacement 09/10/2022   IGT (impaired glucose tolerance) 07/27/2021   Preop testing 07/09/2018   Degenerative lumbar spinal stenosis 05/12/2018   Statin myopathy 08/29/2016   Iliotibial band syndrome of right side 04/23/2016   Acromioclavicular arthrosis 06/30/2015   Left carpal tunnel syndrome 05/15/2015    Adhesive capsulitis of right shoulder 02/06/2015   Dysarthria due to cerebrovascular accident 01/09/2015   Hemiparesis affecting dominant side as late effect of cerebrovascular accident ([place]) 12/07/2014   Spastic neurogenic bladder 10/31/2014   Right rotator cuff tendonitis 10/19/2014   Stenosis of cervical spine region    Chronic right shoulder pain    Cerebral infarction due to thrombosis of left middle cerebral artery ([place])    Left pontine stroke ([place])    TIA (transient ischemic attack) 10/14/2014   Overactive bladder 10/14/2014   H/O: CVA (cerebrovascular accident) 10/14/2014   NECK PAIN, CHRONIC 10/29/2010   Essential hypertension 10/10/2009   INSOMNIA 09/15/2009   Pure hypercholesterolemia 11/28/2008   MENOPAUSAL SYNDROME 11/28/2008    PCP: Betsy Coder MD  REFERRING PROVIDER: Mcarthur Rossetti, MD  REFERRING DIAG: [phone] (ICD-10-CM) - Status post left knee replacement M17.12 (ICD-10-CM) - Unilateral primary osteoarthritis, left knee  THERAPY DIAG:  Chronic pain of left knee  Muscle weakness (generalized)  Difficulty in walking, not elsewhere classified  Localized edema  Rationale for Evaluation and Treatment: Rehabilitation  ONSET DATE: 09/10/2022  SUBJECTIVE:   SUBJECTIVE STATEMENT: Pt indicated feeling more tight today, reported 3/10.  She reported she forgot the tylenol today.   PERTINENT HISTORY: PMH: CVA with Rt hemiparesis, HTN, neurogenic bladder  PAIN:  NPRS  scale: 4/10 upon arrival Pain location: Lt knee  Pain description: achy tightness, stiffness, shooting Aggravating factors: end range pain, getting leg movement.  Relieving factors: avoid pain  PRECAUTIONS: None  WEIGHT BEARING RESTRICTIONS: No  FALLS:  Has patient fallen in last 6 months? 1 fall Do you have a fear of falling?  nope  LIVING ENVIRONMENT: Lives with: primary alone but has children staying at this time.  Lives in: House/apartment Stairs:5 to enter at  front, 2 in back with handrail on Lt  Has following equipment at home:FWW, SPC  OCCUPATION: Retired  PLOF: Independent, hobbies - read, exercise routine  PATIENT GOALS: Walking without cane or walker, strengthening.    OBJECTIVE:   PATIENT SURVEYS:  09/23/2022 FOTO intake: 33   predicted:  52  COGNITION: 09/23/2022 Overall cognitive status: WFL    EDEMA:  09/23/2022 Localized edema in Lt knee noted visually.   PALPATION: 09/23/2022 Mild tenderness joint line, posterior knee joint.   LOWER EXTREMITY MMT:   MMT Right 09/23/2022 Left 09/23/2022  Hip flexion 5/5 4/5  Hip extension    Hip abduction    Hip adduction    Hip internal rotation    Hip external rotation    Knee flexion 5/5 4/5  Knee extension 5/5 2/5  Ankle dorsiflexion 5/5 5/5  Ankle plantarflexion    Ankle inversion    Ankle eversion     (Blank rows = not tested)  LOWER EXTREMITY ROM:  ROM Right 09/23/2022 Left 09/23/2022 Left 10/01/2022  Hip flexion     Hip extension     Hip abduction     Hip adduction     Hip internal rotation     Hip external rotation     Knee flexion  80 AROM in supine heel slide 91 AROM in supine heel slide  Knee extension  -10 AROM in seated LAQ.  -5 in supine quad set AROM   Ankle dorsiflexion     Ankle plantarflexion     Ankle inversion     Ankle eversion      (Blank rows = not tested)   FUNCTIONAL TESTS:  09/23/2022 18 inch chair transfer: unable without UE assist Lt SLS: unable unassisted   GAIT: 10/07/2022:  SPC use in clinic c supervision  09/23/2022 FWW ambulation c reduced stance on Lt leg, maintained knee flexion in stance.    TODAY'S TREATMENT                                                                          DATE:10/07/2022 Therex: Nustep Lvl 6 8 mins UE/LE , seat 7 Leg press Double leg 56 lbs x 15, Single leg 2 x 15 25 lbs  Incline bilateral gastroc stretch 30 sec x 3 Seated AROM LAQ c pause in end ranges 2 x 15 4 lbs Standing alt toe/heel  lift x 15 c bilateral hand on bar Seated Lt knee AAROM c overpressure from Rt leg 10 sec x   Neuro Re-ed Tandem stance c occasional hand assist 1 min x 1 bilateral 4 inch step over foot clearing c fwd step/retro step for improved WB in ambulation, single hand Rt on bar c CGA x 10 each  Manual: Seated Lt knee flexion c  distraction/IR mobilization c movement for mobiltiy gains.    Vaso 10 mins Lt knee in elevation medium compression 34 degrees  TODAY'S TREATMENT                                                                          DATE:10/03/2022 Therex: Nustep Lvl 6 8 mins UE/LE , seat 7 Leg press Double leg 50 lbs x 15, Single leg x 15 25 lbs  Incline bilateral gastroc stretch 30 sec x 3 Seated AROM LAQ c pause in end ranges x 20 3 lbs  Supine quad set 5 sec hold x 15  Gait Training 2 inch step over foot clearing c fwd step/retro step for improved WB in ambulation, single hand Rt on bar c CGA Fwd/reverse ambulation // bars c cues for larger step lengths, CGA  SPC use in clinic 50 ft x 2 c CGA and brief cues for techniques.   Manual: Seated Lt knee flexion c distraction/IR mobilization c movement for mobiltiy gains.  Contract/relax to Lt quad for flexion.  Attempted percussive device to quad , tolerated poor to fair so only used for 1-2 minutes due to tenderness in quad.    Vaso 10 mins Lt knee in elevation medium compression 34 degrees  TODAY'S TREATMENT                                                                          DATE:10/01/2022 Therex: Nustep Lvl 5 8 mins UE/LE , seat 8 Leg press Double leg 50 lbs x 15, Single leg x 15 25 lbs  Incline bilateral gastroc stretch 30 sec x 3 Seated AROM LAQ c pause in end ranges x 10, 2 lbs x 15 Seated alternating isometric flexion/extension in mid range Lt knee 5 sec each x 12 Sit to stand to sit no UE assist 23 inch table height x 10   Manual: Seated Lt knee flexion c distraction/IR mobilization c movement for mobiltiy gains.   Contract/relax to Lt quad for flexion   Vaso 10 mins Lt knee in elevation medium compression 34 degrees   PATIENT EDUCATION:  09/23/2022 Education details: HEP, POC Person educated: Patient Education method: Consulting civil engineer, Demonstration, Verbal cues, and Handouts Education comprehension: verbalized understanding, returned demonstration, and verbal cues required  HOME EXERCISE PROGRAM: Access Code: [email or web address] URL: [email or web address] Date: 09/23/2022 Prepared by: Scot Jun  Exercises - Seated Long Arc Quad (Mirrored)  - 3-5 x daily - 7 x weekly - 1-2 sets - 10 reps - 2 hold - Supine Heel Slide with Strap  - 3-5 x daily - 7 x weekly - 1-2 sets - 10 reps - 5 hold - Seated Quad Set (Mirrored)  - 3-5 x daily - 7 x weekly - 1 sets - 10 reps - 5 hold - Supine Knee Extension Mobilization with Weight (Mirrored)  - 3-5 x daily - 7 x weekly - 1 sets - 1 reps - to tolerance up to 15 mins hold  ASSESSMENT:  CLINICAL IMPRESSION: Ambulation assessment in clinic c [facility] showed more concern requiring the supervision to SBA level of guarding due to Rt foot drop from past medical history vs. Instability from Lt leg.  Maintained knee flexion in stance on Lt noted though.  Continued steady overall progress noted with each visit toward motion goals.   OBJECTIVE IMPAIRMENTS: Abnormal gait, decreased activity tolerance, decreased balance, decreased endurance, decreased mobility, difficulty walking, decreased ROM, decreased strength, hypomobility, increased edema, impaired flexibility, improper body mechanics, and pain.   ACTIVITY LIMITATIONS: carrying, lifting, bending, sitting, standing, squatting, sleeping, stairs, transfers, bed mobility, bathing, dressing, and locomotion level  PARTICIPATION LIMITATIONS: meal prep, cleaning, laundry, interpersonal relationship, driving, shopping, and community activity  PERSONAL FACTORS:  CVA with Rt hemiparesis, HTN, neurogenic bladder  are also  affecting patient's functional outcome.   REHAB POTENTIAL: Good  CLINICAL DECISION MAKING: Stable/uncomplicated  EVALUATION COMPLEXITY: Low   GOALS: Goals reviewed with patient? Yes  SHORT TERM GOALS: (target date for Short term goals are 3 weeks 10/14/2022)   1.  Patient will demonstrate independent use of home exercise program to maintain progress from in clinic treatments.  Goal status: Met 10/07/2022  LONG TERM GOALS: (target dates for all long term goals are 10 weeks  12/02/2022 )   1. Patient will demonstrate/report pain at worst less than or equal to 2/10 to facilitate minimal limitation in daily activity secondary to pain symptoms.  Goal status: on going 10/07/2022   2. Patient will demonstrate independent use of home exercise program to facilitate ability to maintain/progress functional gains from skilled physical therapy services.  Goal status: on going 10/07/2022   3. Patient will demonstrate FOTO outcome > or = 52 % to indicate reduced disability due to condition.  Goal status: on going 10/07/2022   4.  Patient will demonstrate Lt  LE MMT 5/5 throughout to faciltiate usual transfers, stairs, squatting at [facility] for daily life.   Goal status: on going 10/07/2022   5.  Patient will demonstrate Lt knee AROM 0-110 deg to facilitate usual transfers, ambulation, and stairs at PLOF s limitation.  Goal status: on going 10/07/2022   6.  Patient will demonstrate independent ambulation community distances > 300 ft to facilitate community ambulation.  Goal status: on going 10/07/2022   7.  Patient will demonstrate ascending/descending stairs c reciprocal gait pattern for household navigation.  Goal Status: on going 10/07/2022   PLAN:  PT FREQUENCY: 2x/week  PT DURATION: 10 weeks  PLANNED INTERVENTIONS: Therapeutic exercises, Therapeutic activity, Neuro Muscular re-education, Balance training, Gait training, Patient/Family education, Joint mobilization, Stair training,  DME instructions, Dry Needling, Electrical stimulation, Traction, Cryotherapy, vasopneumatic deviceMoist heat, Taping, Ultrasound, Ionotophoresis 78m/ml Dexamethasone, and Manual therapy.  All included unless contraindicated  PLAN FOR NEXT SESSION: Continue SPC use training in clinic.  Progressive strengthening and balance improvements.    MScot Jun PT, DPT, OCS, ATC 10/07/22  2:32 PM

## 2022-10-09 ENCOUNTER — Ambulatory Visit (INDEPENDENT_AMBULATORY_CARE_PROVIDER_SITE_OTHER): Payer: Medicare PPO | Admitting: Rehabilitative and Restorative Service Providers"

## 2022-10-09 ENCOUNTER — Encounter: Payer: Self-pay | Admitting: Rehabilitative and Restorative Service Providers"

## 2022-10-09 DIAGNOSIS — R600 Localized edema: Secondary | ICD-10-CM

## 2022-10-09 DIAGNOSIS — G8929 Other chronic pain: Secondary | ICD-10-CM

## 2022-10-09 DIAGNOSIS — R262 Difficulty in walking, not elsewhere classified: Secondary | ICD-10-CM | POA: Diagnosis not present

## 2022-10-09 DIAGNOSIS — M25562 Pain in left knee: Secondary | ICD-10-CM | POA: Diagnosis not present

## 2022-10-09 DIAGNOSIS — M6281 Muscle weakness (generalized): Secondary | ICD-10-CM | POA: Diagnosis not present

## 2022-10-09 NOTE — Therapy (Signed)
OUTPATIENT PHYSICAL THERAPY TREATMENT   Patient Name: Teresa Martinez MRN: [id number] DOB:May 30, 1941, 81 y.o., female Today's Date: 10/09/2022  END OF SESSION:   PT End of Session - 10/09/22 1351     Visit Number 6    Number of Visits 20    Date for PT Re-Evaluation 12/02/22    Authorization Type [employer] medicare $20 copay    Progress Note Due on Visit 10    PT Start Time 1344    PT Stop Time 1434    PT Time Calculation (min) 50 min    Activity Tolerance Patient tolerated treatment well    Behavior During Therapy WFL for tasks assessed/performed                  Past Medical History:  Diagnosis Date   Cervical spine degeneration    C5/C6   Chronic right shoulder pain    GERD (gastroesophageal reflux disease)    Hemiparesis affecting dominant side as late effect of cerebrovascular accident ([place]) 12/07/2014   Hyperlipemia    Hypertension    Menopausal syndrome    [name]   Overactive bladder    Statin intolerance    Stroke ([place]) 2015   Past Surgical History:  Procedure Laterality Date   ABDOMINAL HYSTERECTOMY     1985   BAND HEMORRHOIDECTOMY     2010   CATARACT EXTRACTION, BILATERAL     CHOLECYSTECTOMY     1950   LUMBAR LAMINECTOMY Bilateral 08/03/2018   Dr. Octavio Manns, L3 laminotomy, L4-L5 laminectomy with neural foraminal decompression.   PAROTIDECTOMY     30 years ago   TOTAL KNEE ARTHROPLASTY Left 09/10/2022   Procedure: LEFT TOTAL KNEE ARTHROPLASTY;  Surgeon: Mcarthur Rossetti, MD;  Location: [facility];  Service: Orthopedics;  Laterality: Left;   Patient Active Problem List   Diagnosis Date Noted   Status post left knee replacement 09/10/2022   IGT (impaired glucose tolerance) 07/27/2021   Preop testing 07/09/2018   Degenerative lumbar spinal stenosis 05/12/2018   Statin myopathy 08/29/2016   Iliotibial band syndrome of right side 04/23/2016   Acromioclavicular arthrosis 06/30/2015   Left carpal tunnel syndrome 05/15/2015    Adhesive capsulitis of right shoulder 02/06/2015   Dysarthria due to cerebrovascular accident 01/09/2015   Hemiparesis affecting dominant side as late effect of cerebrovascular accident ([place]) 12/07/2014   Spastic neurogenic bladder 10/31/2014   Right rotator cuff tendonitis 10/19/2014   Stenosis of cervical spine region    Chronic right shoulder pain    Cerebral infarction due to thrombosis of left middle cerebral artery ([place])    Left pontine stroke ([place])    TIA (transient ischemic attack) 10/14/2014   Overactive bladder 10/14/2014   H/O: CVA (cerebrovascular accident) 10/14/2014   NECK PAIN, CHRONIC 10/29/2010   Essential hypertension 10/10/2009   INSOMNIA 09/15/2009   Pure hypercholesterolemia 11/28/2008   MENOPAUSAL SYNDROME 11/28/2008    PCP: Betsy Coder MD  REFERRING PROVIDER: Mcarthur Rossetti, MD  REFERRING DIAG: [phone] (ICD-10-CM) - Status post left knee replacement M17.12 (ICD-10-CM) - Unilateral primary osteoarthritis, left knee  THERAPY DIAG:  Chronic pain of left knee  Muscle weakness (generalized)  Difficulty in walking, not elsewhere classified  Localized edema  Rationale for Evaluation and Treatment: Rehabilitation  ONSET DATE: 09/10/2022  SUBJECTIVE:   SUBJECTIVE STATEMENT: Pt indicated having 4/10 pain today, feeling stiffer after doing HEP.  Pt indicated cane more at home.    PERTINENT HISTORY: PMH: CVA with Rt hemiparesis, HTN, neurogenic bladder  PAIN:  NPRS scale: 4/10  Pain location: Lt knee  Pain description: achy tightness, stiffness, shooting Aggravating factors: end range pain, getting leg movement.  Relieving factors: avoid pain  PRECAUTIONS: None  WEIGHT BEARING RESTRICTIONS: No  FALLS:  Has patient fallen in last 6 months? 1 fall Do you have a fear of falling?  nope  LIVING ENVIRONMENT: Lives with: primary alone but has children staying at this time.  Lives in: House/apartment Stairs:5 to enter at  front, 2 in back with handrail on Lt  Has following equipment at home:FWW, SPC  OCCUPATION: Retired  PLOF: Independent, hobbies - read, exercise routine  PATIENT GOALS: Walking without cane or walker, strengthening.    OBJECTIVE:   PATIENT SURVEYS:  09/23/2022 FOTO intake: 33   predicted:  52  COGNITION: 09/23/2022 Overall cognitive status: WFL    EDEMA:  09/23/2022 Localized edema in Lt knee noted visually.   PALPATION: 09/23/2022 Mild tenderness joint line, posterior knee joint.   LOWER EXTREMITY MMT:   MMT Right 09/23/2022 Left 09/23/2022  Hip flexion 5/5 4/5  Hip extension    Hip abduction    Hip adduction    Hip internal rotation    Hip external rotation    Knee flexion 5/5 4/5  Knee extension 5/5 2/5  Ankle dorsiflexion 5/5 5/5  Ankle plantarflexion    Ankle inversion    Ankle eversion     (Blank rows = not tested)  LOWER EXTREMITY ROM:  ROM Right 09/23/2022 Left 09/23/2022 Left 10/01/2022 Left 10/09/2022  Hip flexion      Hip extension      Hip abduction      Hip adduction      Hip internal rotation      Hip external rotation      Knee flexion  80 AROM in supine heel slide 91 AROM in supine heel slide 105 AROM in supine heel slide  Knee extension  -10 AROM in seated LAQ.  -5 in supine quad set AROM  -8 AROM in quad set in supine  Ankle dorsiflexion      Ankle plantarflexion      Ankle inversion      Ankle eversion       (Blank rows = not tested)   FUNCTIONAL TESTS:  09/23/2022 18 inch chair transfer: unable without UE assist Lt SLS: unable unassisted   GAIT: 10/07/2022:  SPC use in clinic c supervision  09/23/2022 FWW ambulation c reduced stance on Lt leg, maintained knee flexion in stance.    TODAY'S TREATMENT                                                                          DATE:10/09/2022 Therex: UBE UE/LE only lvl 2.0 seat 10 7 mins Supine quad set 5 sec hold x 10  Leg press Double leg 62 lbs x 15, Single leg 2 x 15 25 lbs   Seated AROM LAQ c pause in end ranges x 15 no weight  2 x 15 4 lbs Seated Lt heel prop in chair 4 mins (cues for Rt leg crossed over for extra pressure)  Manual: Seated Lt knee flexion c distraction/IR mobilization c movement for mobiltiy gains.    Vaso 10 mins  Lt knee in elevation medium compression 34 degrees  TODAY'S TREATMENT                                                                          DATE:10/07/2022 Therex: Nustep Lvl 6 8 mins UE/LE , seat 7 Leg press Double leg 56 lbs x 15, Single leg 2 x 15 25 lbs  Incline bilateral gastroc stretch 30 sec x 3 Seated AROM LAQ c pause in end ranges 2 x 15 4 lbs Standing alt toe/heel lift x 15 c bilateral hand on bar Seated Lt knee AAROM c overpressure from Rt leg 10 sec x   Neuro Re-ed Tandem stance c occasional hand assist 1 min x 1 bilateral 4 inch step over foot clearing c fwd step/retro step for improved WB in ambulation, single hand Rt on bar c CGA x 10 each  Manual: Seated Lt knee flexion c distraction/IR mobilization c movement for mobiltiy gains.    Vaso 10 mins Lt knee in elevation medium compression 34 degrees  TODAY'S TREATMENT                                                                          DATE:10/03/2022 Therex: Nustep Lvl 6 8 mins UE/LE , seat 7 Leg press Double leg 50 lbs x 15, Single leg x 15 25 lbs  Incline bilateral gastroc stretch 30 sec x 3 Seated AROM LAQ c pause in end ranges x 20 3 lbs  Supine quad set 5 sec hold x 15  Gait Training 2 inch step over foot clearing c fwd step/retro step for improved WB in ambulation, single hand Rt on bar c CGA Fwd/reverse ambulation // bars c cues for larger step lengths, CGA  SPC use in clinic 50 ft x 2 c CGA and brief cues for techniques.   Manual: Seated Lt knee flexion c distraction/IR mobilization c movement for mobiltiy gains.  Contract/relax to Lt quad for flexion.  Attempted percussive device to quad , tolerated poor to fair so only used for 1-2  minutes due to tenderness in quad.    Vaso 10 mins Lt knee in elevation medium compression 34 degrees   PATIENT EDUCATION:  09/23/2022 Education details: HEP, POC Person educated: Patient Education method: Consulting civil engineer, Demonstration, Verbal cues, and Handouts Education comprehension: verbalized understanding, returned demonstration, and verbal cues required  HOME EXERCISE PROGRAM: Access Code: [email or web address] URL: [email or web address] Date: 09/23/2022 Prepared by: Scot Jun  Exercises - Seated Long Arc Quad (Mirrored)  - 3-5 x daily - 7 x weekly - 1-2 sets - 10 reps - 2 hold - Supine Heel Slide with Strap  - 3-5 x daily - 7 x weekly - 1-2 sets - 10 reps - 5 hold - Seated Quad Set (Mirrored)  - 3-5 x daily - 7 x weekly - 1 sets - 10 reps - 5 hold - Supine Knee Extension Mobilization with Weight (Mirrored)  - 3-5 x daily - 7 x  weekly - 1 sets - 1 reps - to tolerance up to 15 mins hold  ASSESSMENT:  CLINICAL IMPRESSION: Ambulation assessment in clinic c [facility] showed more concern requiring the supervision to SBA level of guarding due to Rt foot drop from past medical history vs. Instability from Lt leg.  Maintained knee flexion in stance on Lt noted though.  Continued steady overall progress noted with each visit toward motion goals.   OBJECTIVE IMPAIRMENTS: Abnormal gait, decreased activity tolerance, decreased balance, decreased endurance, decreased mobility, difficulty walking, decreased ROM, decreased strength, hypomobility, increased edema, impaired flexibility, improper body mechanics, and pain.   ACTIVITY LIMITATIONS: carrying, lifting, bending, sitting, standing, squatting, sleeping, stairs, transfers, bed mobility, bathing, dressing, and locomotion level  PARTICIPATION LIMITATIONS: meal prep, cleaning, laundry, interpersonal relationship, driving, shopping, and community activity  PERSONAL FACTORS:  CVA with Rt hemiparesis, HTN, neurogenic bladder  are also  affecting patient's functional outcome.   REHAB POTENTIAL: Good  CLINICAL DECISION MAKING: Stable/uncomplicated  EVALUATION COMPLEXITY: Low   GOALS: Goals reviewed with patient? Yes  SHORT TERM GOALS: (target date for Short term goals are 3 weeks 10/14/2022)   1.  Patient will demonstrate independent use of home exercise program to maintain progress from in clinic treatments.  Goal status: Met 10/07/2022  LONG TERM GOALS: (target dates for all long term goals are 10 weeks  12/02/2022 )   1. Patient will demonstrate/report pain at worst less than or equal to 2/10 to facilitate minimal limitation in daily activity secondary to pain symptoms.  Goal status: on going 10/07/2022   2. Patient will demonstrate independent use of home exercise program to facilitate ability to maintain/progress functional gains from skilled physical therapy services.  Goal status: on going 10/07/2022   3. Patient will demonstrate FOTO outcome > or = 52 % to indicate reduced disability due to condition.  Goal status: on going 10/07/2022   4.  Patient will demonstrate Lt  LE MMT 5/5 throughout to faciltiate usual transfers, stairs, squatting at [facility] for daily life.   Goal status: on going 10/07/2022   5.  Patient will demonstrate Lt knee AROM 0-110 deg to facilitate usual transfers, ambulation, and stairs at PLOF s limitation.  Goal status: on going 10/07/2022   6.  Patient will demonstrate independent ambulation community distances > 300 ft to facilitate community ambulation.  Goal status: on going 10/07/2022   7.  Patient will demonstrate ascending/descending stairs c reciprocal gait pattern for household navigation.  Goal Status: on going 10/07/2022   PLAN:  PT FREQUENCY: 2x/week  PT DURATION: 10 weeks  PLANNED INTERVENTIONS: Therapeutic exercises, Therapeutic activity, Neuro Muscular re-education, Balance training, Gait training, Patient/Family education, Joint mobilization, Stair training,  DME instructions, Dry Needling, Electrical stimulation, Traction, Cryotherapy, vasopneumatic deviceMoist heat, Taping, Ultrasound, Ionotophoresis 68m/ml Dexamethasone, and Manual therapy.  All included unless contraindicated  PLAN FOR NEXT SESSION: Progressive strengthening and balance improvements.   Stairs?   MScot Jun PT, DPT, OCS, ATC 10/09/22  2:25 PM

## 2022-10-14 ENCOUNTER — Encounter: Payer: Self-pay | Admitting: Rehabilitative and Restorative Service Providers"

## 2022-10-14 ENCOUNTER — Telehealth: Payer: Self-pay | Admitting: Internal Medicine

## 2022-10-14 ENCOUNTER — Ambulatory Visit (INDEPENDENT_AMBULATORY_CARE_PROVIDER_SITE_OTHER): Payer: Medicare PPO | Admitting: Rehabilitative and Restorative Service Providers"

## 2022-10-14 DIAGNOSIS — R262 Difficulty in walking, not elsewhere classified: Secondary | ICD-10-CM | POA: Diagnosis not present

## 2022-10-14 DIAGNOSIS — M25562 Pain in left knee: Secondary | ICD-10-CM

## 2022-10-14 DIAGNOSIS — M6281 Muscle weakness (generalized): Secondary | ICD-10-CM | POA: Diagnosis not present

## 2022-10-14 DIAGNOSIS — G8929 Other chronic pain: Secondary | ICD-10-CM

## 2022-10-14 DIAGNOSIS — R600 Localized edema: Secondary | ICD-10-CM

## 2022-10-14 NOTE — Therapy (Signed)
OUTPATIENT PHYSICAL THERAPY TREATMENT   Patient Name: Teresa Barradas Mintz-Whitcomb MRN: [id number] DOB:03-09-1941, 81 y.o., female Today's Date: 10/14/2022  END OF SESSION:   PT End of Session - 10/14/22 1352     Visit Number 7    Number of Visits 20    Date for PT Re-Evaluation 12/02/22    Authorization Type [employer] medicare $20 copay    Progress Note Due on Visit 10    PT Start Time 1751    PT Stop Time 1434    PT Time Calculation (min) 49 min    Activity Tolerance Patient tolerated treatment well    Behavior During Therapy WFL for tasks assessed/performed                   Past Medical History:  Diagnosis Date   Cervical spine degeneration    C5/C6   Chronic right shoulder pain    GERD (gastroesophageal reflux disease)    Hemiparesis affecting dominant side as late effect of cerebrovascular accident ([place]) 12/07/2014   Hyperlipemia    Hypertension    Menopausal syndrome    [name]   Overactive bladder    Statin intolerance    Stroke ([place]) 2015   Past Surgical History:  Procedure Laterality Date   ABDOMINAL HYSTERECTOMY     1985   BAND HEMORRHOIDECTOMY     2010   CATARACT EXTRACTION, BILATERAL     CHOLECYSTECTOMY     1950   LUMBAR LAMINECTOMY Bilateral 08/03/2018   Dr. Octavio Manns, L3 laminotomy, L4-L5 laminectomy with neural foraminal decompression.   PAROTIDECTOMY     30 years ago   TOTAL KNEE ARTHROPLASTY Left 09/10/2022   Procedure: LEFT TOTAL KNEE ARTHROPLASTY;  Surgeon: Mcarthur Rossetti, MD;  Location: [place];  Service: Orthopedics;  Laterality: Left;   Patient Active Problem List   Diagnosis Date Noted   Status post left knee replacement 09/10/2022   IGT (impaired glucose tolerance) 07/27/2021   Preop testing 07/09/2018   Degenerative lumbar spinal stenosis 05/12/2018   Statin myopathy 08/29/2016   Iliotibial band syndrome of right side 04/23/2016   Acromioclavicular arthrosis 06/30/2015   Left carpal tunnel syndrome  05/15/2015   Adhesive capsulitis of right shoulder 02/06/2015   Dysarthria due to cerebrovascular accident 01/09/2015   Hemiparesis affecting dominant side as late effect of cerebrovascular accident ([place]) 12/07/2014   Spastic neurogenic bladder 10/31/2014   Right rotator cuff tendonitis 10/19/2014   Stenosis of cervical spine region    Chronic right shoulder pain    Cerebral infarction due to thrombosis of left middle cerebral artery ([place])    Left pontine stroke ([place])    TIA (transient ischemic attack) 10/14/2014   Overactive bladder 10/14/2014   H/O: CVA (cerebrovascular accident) 10/14/2014   NECK PAIN, CHRONIC 10/29/2010   Essential hypertension 10/10/2009   INSOMNIA 09/15/2009   Pure hypercholesterolemia 11/28/2008   MENOPAUSAL SYNDROME 11/28/2008    PCP: Betsy Coder MD  REFERRING PROVIDER: Mcarthur Rossetti, MD  REFERRING DIAG: [phone] (ICD-10-CM) - Status post left knee replacement M17.12 (ICD-10-CM) - Unilateral primary osteoarthritis, left knee  THERAPY DIAG:  Chronic pain of left knee  Muscle weakness (generalized)  Difficulty in walking, not elsewhere classified  Localized edema  Rationale for Evaluation and Treatment: Rehabilitation  ONSET DATE: 09/10/2022  SUBJECTIVE:   SUBJECTIVE STATEMENT: Pt indicated feeling soreness in knee and upper leg medially in times since last visit.  Pt indicated feeling it with walking.    PERTINENT HISTORY: PMH: CVA with  Rt hemiparesis, HTN, neurogenic bladder  PAIN:  NPRS scale: 4/10 Pain location: Lt knee  Pain description: achy tightness Aggravating factors: end range pain, getting leg movement.  Relieving factors: avoid pain  PRECAUTIONS: None  WEIGHT BEARING RESTRICTIONS: No  FALLS:  Has patient fallen in last 6 months? 1 fall Do you have a fear of falling?  nope  LIVING ENVIRONMENT: Lives with: primary alone but has children staying at this time.  Lives in: House/apartment Stairs:5 to  enter at front, 2 in back with handrail on Lt  Has following equipment at home:FWW, SPC  OCCUPATION: Retired  PLOF: Independent, hobbies - read, exercise routine  PATIENT GOALS: Walking without cane or walker, strengthening.    OBJECTIVE:   PATIENT SURVEYS:  09/23/2022 FOTO intake: 33   predicted:  52  COGNITION: 09/23/2022 Overall cognitive status: WFL    EDEMA:  09/23/2022 Localized edema in Lt knee noted visually.   PALPATION: 09/23/2022 Mild tenderness joint line, posterior knee joint.   LOWER EXTREMITY MMT:   MMT Right 09/23/2022 Left 09/23/2022  Hip flexion 5/5 4/5  Hip extension    Hip abduction    Hip adduction    Hip internal rotation    Hip external rotation    Knee flexion 5/5 4/5  Knee extension 5/5 2/5  Ankle dorsiflexion 5/5 5/5  Ankle plantarflexion    Ankle inversion    Ankle eversion     (Blank rows = not tested)  LOWER EXTREMITY ROM:  ROM Right 09/23/2022 Left 09/23/2022 Left 10/01/2022 Left 10/09/2022  Hip flexion      Hip extension      Hip abduction      Hip adduction      Hip internal rotation      Hip external rotation      Knee flexion  80 AROM in supine heel slide 91 AROM in supine heel slide 105 AROM in supine heel slide  Knee extension  -10 AROM in seated LAQ.  -5 in supine quad set AROM  -8 AROM in quad set in supine  Ankle dorsiflexion      Ankle plantarflexion      Ankle inversion      Ankle eversion       (Blank rows = not tested)   FUNCTIONAL TESTS:  09/23/2022 18 inch chair transfer: unable without UE assist Lt SLS: unable unassisted   GAIT: 10/07/2022:  SPC use in clinic c supervision  09/23/2022 FWW ambulation c reduced stance on Lt leg, maintained knee flexion in stance.    TODAY'S TREATMENT                                                                          DATE:10/14/2022 Therex: UBE UE/LE only lvl 2.5 seat 10 8 mins Leg press Double leg 75 lbs x 15, Lt Single leg 2 x 15 31 lbs  Seated AROM LAQ c  pause in end ranges x 10 no weight, 5 lbs x 15 Seated Lt knee flexion AAROM with Rt leg overpressure 10 sec x 5 Step up fwd Lt leg WB with single hand use x 10 Seated SLR Lt x 10   Neuro Re-ed Tandem stance 1 min x 1 bilateral   4  inch step over foot clearing c fwd step/retro step for improved WB in ambulation, single hand Rt on bar c CGA x 10 each  Vaso 10 mins Lt knee in elevation medium compression 34 degrees c heel prop stretch during first 5 mins  TODAY'S TREATMENT                                                                          DATE:10/09/2022 Therex: UBE UE/LE only lvl 2.0 seat 10 7 mins Supine quad set 5 sec hold x 10  Leg press Double leg 62 lbs x 15, Single leg 2 x 15 25 lbs  Seated AROM LAQ c pause in end ranges x 15 no weight  2 x 15 4 lbs Seated Lt heel prop in chair 4 mins (cues for Rt leg crossed over for extra pressure)  Manual: Seated Lt knee flexion c distraction/IR mobilization c movement for mobiltiy gains.    Vaso 10 mins Lt knee in elevation medium compression 34 degrees  TODAY'S TREATMENT                                                                          DATE:10/07/2022 Therex: Nustep Lvl 6 8 mins UE/LE , seat 7 Leg press Double leg 56 lbs x 15, Single leg 2 x 15 25 lbs  Incline bilateral gastroc stretch 30 sec x 3 Seated AROM LAQ c pause in end ranges 2 x 15 4 lbs Standing alt toe/heel lift x 15 c bilateral hand on bar Seated Lt knee AAROM c overpressure from Rt leg 10 sec x   Neuro Re-ed Tandem stance c occasional hand assist 1 min x 1 bilateral 4 inch step over foot clearing c fwd step/retro step for improved WB in ambulation, single hand Rt on bar c CGA x 10 each  Manual: Seated Lt knee flexion c distraction/IR mobilization c movement for mobiltiy gains.    Vaso 10 mins Lt knee in elevation medium compression 34 degrees   PATIENT EDUCATION:  09/23/2022 Education details: HEP, POC Person educated: Patient Education method:  Consulting civil engineer, Demonstration, Verbal cues, and Handouts Education comprehension: verbalized understanding, returned demonstration, and verbal cues required  HOME EXERCISE PROGRAM: Access Code: [email or web address] URL: [email or web address] Date: 09/23/2022 Prepared by: Scot Jun  Exercises - Seated Long Arc Quad (Mirrored)  - 3-5 x daily - 7 x weekly - 1-2 sets - 10 reps - 2 hold - Supine Heel Slide with Strap  - 3-5 x daily - 7 x weekly - 1-2 sets - 10 reps - 5 hold - Seated Quad Set (Mirrored)  - 3-5 x daily - 7 x weekly - 1 sets - 10 reps - 5 hold - Supine Knee Extension Mobilization with Weight (Mirrored)  - 3-5 x daily - 7 x weekly - 1 sets - 1 reps - to tolerance up to 15 mins hold  ASSESSMENT:  CLINICAL IMPRESSION: Terminal knee extension lacking as evident in ambulation and in  leg press/LAQ full range attempts.  Continued emphasis on quad sets, SLR and heel prop stretching.   OBJECTIVE IMPAIRMENTS: Abnormal gait, decreased activity tolerance, decreased balance, decreased endurance, decreased mobility, difficulty walking, decreased ROM, decreased strength, hypomobility, increased edema, impaired flexibility, improper body mechanics, and pain.   ACTIVITY LIMITATIONS: carrying, lifting, bending, sitting, standing, squatting, sleeping, stairs, transfers, bed mobility, bathing, dressing, and locomotion level  PARTICIPATION LIMITATIONS: meal prep, cleaning, laundry, interpersonal relationship, driving, shopping, and community activity  PERSONAL FACTORS:  CVA with Rt hemiparesis, HTN, neurogenic bladder  are also affecting patient's functional outcome.   REHAB POTENTIAL: Good  CLINICAL DECISION MAKING: Stable/uncomplicated  EVALUATION COMPLEXITY: Low   GOALS: Goals reviewed with patient? Yes  SHORT TERM GOALS: (target date for Short term goals are 3 weeks 10/14/2022)   1.  Patient will demonstrate independent use of home exercise program to maintain progress from in  clinic treatments.  Goal status: Met 10/07/2022  LONG TERM GOALS: (target dates for all long term goals are 10 weeks  12/02/2022 )   1. Patient will demonstrate/report pain at worst less than or equal to 2/10 to facilitate minimal limitation in daily activity secondary to pain symptoms.  Goal status: on going 10/07/2022   2. Patient will demonstrate independent use of home exercise program to facilitate ability to maintain/progress functional gains from skilled physical therapy services.  Goal status: on going 10/07/2022   3. Patient will demonstrate FOTO outcome > or = 52 % to indicate reduced disability due to condition.  Goal status: on going 10/07/2022   4.  Patient will demonstrate Lt  LE MMT 5/5 throughout to faciltiate usual transfers, stairs, squatting at [facility] for daily life.   Goal status: on going 10/07/2022   5.  Patient will demonstrate Lt knee AROM 0-110 deg to facilitate usual transfers, ambulation, and stairs at PLOF s limitation.  Goal status: on going 10/07/2022   6.  Patient will demonstrate independent ambulation community distances > 300 ft to facilitate community ambulation.  Goal status: on going 10/07/2022   7.  Patient will demonstrate ascending/descending stairs c reciprocal gait pattern for household navigation.  Goal Status: on going 10/07/2022   PLAN:  PT FREQUENCY: 2x/week  PT DURATION: 10 weeks  PLANNED INTERVENTIONS: Therapeutic exercises, Therapeutic activity, Neuro Muscular re-education, Balance training, Gait training, Patient/Family education, Joint mobilization, Stair training, DME instructions, Dry Needling, Electrical stimulation, Traction, Cryotherapy, vasopneumatic deviceMoist heat, Taping, Ultrasound, Ionotophoresis 30m/ml Dexamethasone, and Manual therapy.  All included unless contraindicated  PLAN FOR NEXT SESSION: Continued quad strengthening, extension mobility gains.    MScot Jun PT, DPT, OCS, ATC 10/14/22  2:25 PM

## 2022-10-14 NOTE — Telephone Encounter (Signed)
Pt c/o medication issue:  1. Name of Medication:   REPATHA SURECLICK 140 MG/ML SOAJ    2. How are you currently taking this medication (dosage and times per day)? Injects every 14 days  3. Are you having a reaction (difficulty breathing--STAT)? no  4. What is your medication issue? Patient states the injectable was faulty and she contacted the pharmacy. She states they said they need verification that the patient was prescribed the medication. She requests someone from the office call the pharmacy. Phone: [phone] case number: [phone]

## 2022-10-14 NOTE — Telephone Encounter (Addendum)
Verbal rx given to [facility] pharmacy to replace 1 Repatha pen for pt.

## 2022-10-14 NOTE — Telephone Encounter (Signed)
Phone kept ringing.  No answer.

## 2022-10-16 ENCOUNTER — Ambulatory Visit (INDEPENDENT_AMBULATORY_CARE_PROVIDER_SITE_OTHER): Payer: Medicare PPO | Admitting: Rehabilitative and Restorative Service Providers"

## 2022-10-16 ENCOUNTER — Encounter: Payer: Self-pay | Admitting: Rehabilitative and Restorative Service Providers"

## 2022-10-16 DIAGNOSIS — R600 Localized edema: Secondary | ICD-10-CM

## 2022-10-16 DIAGNOSIS — M6281 Muscle weakness (generalized): Secondary | ICD-10-CM | POA: Diagnosis not present

## 2022-10-16 DIAGNOSIS — M25562 Pain in left knee: Secondary | ICD-10-CM | POA: Diagnosis not present

## 2022-10-16 DIAGNOSIS — G8929 Other chronic pain: Secondary | ICD-10-CM

## 2022-10-16 DIAGNOSIS — R262 Difficulty in walking, not elsewhere classified: Secondary | ICD-10-CM | POA: Diagnosis not present

## 2022-10-16 NOTE — Therapy (Signed)
OUTPATIENT PHYSICAL THERAPY TREATMENT   Patient Name: Teresa Martinez MRN: [id number] DOB:04-01-1941, 81 y.o., female Today's Date: 10/16/2022  END OF SESSION:   PT End of Session - 10/16/22 1354     Visit Number 8    Number of Visits 20    Date for PT Re-Evaluation 12/02/22    Authorization Type [employer] medicare $20 copay    Progress Note Due on Visit 10    PT Start Time 1343    PT Stop Time 1435    PT Time Calculation (min) 52 min    Activity Tolerance Patient tolerated treatment well    Behavior During Therapy WFL for tasks assessed/performed                    Past Medical History:  Diagnosis Date   Cervical spine degeneration    C5/C6   Chronic right shoulder pain    GERD (gastroesophageal reflux disease)    Hemiparesis affecting dominant side as late effect of cerebrovascular accident ([place]) 12/07/2014   Hyperlipemia    Hypertension    Menopausal syndrome    [name]   Overactive bladder    Statin intolerance    Stroke ([place]) 2015   Past Surgical History:  Procedure Laterality Date   ABDOMINAL HYSTERECTOMY     1985   BAND HEMORRHOIDECTOMY     2010   CATARACT EXTRACTION, BILATERAL     CHOLECYSTECTOMY     1950   LUMBAR LAMINECTOMY Bilateral 08/03/2018   Dr. Octavio Manns, L3 laminotomy, L4-L5 laminectomy with neural foraminal decompression.   PAROTIDECTOMY     30 years ago   TOTAL KNEE ARTHROPLASTY Left 09/10/2022   Procedure: LEFT TOTAL KNEE ARTHROPLASTY;  Surgeon: Mcarthur Rossetti, MD;  Location: [place];  Service: Orthopedics;  Laterality: Left;   Patient Active Problem List   Diagnosis Date Noted   Status post left knee replacement 09/10/2022   IGT (impaired glucose tolerance) 07/27/2021   Preop testing 07/09/2018   Degenerative lumbar spinal stenosis 05/12/2018   Statin myopathy 08/29/2016   Iliotibial band syndrome of right side 04/23/2016   Acromioclavicular arthrosis 06/30/2015   Left carpal tunnel syndrome  05/15/2015   Adhesive capsulitis of right shoulder 02/06/2015   Dysarthria due to cerebrovascular accident 01/09/2015   Hemiparesis affecting dominant side as late effect of cerebrovascular accident ([place]) 12/07/2014   Spastic neurogenic bladder 10/31/2014   Right rotator cuff tendonitis 10/19/2014   Stenosis of cervical spine region    Chronic right shoulder pain    Cerebral infarction due to thrombosis of left middle cerebral artery ([place])    Left pontine stroke ([place])    TIA (transient ischemic attack) 10/14/2014   Overactive bladder 10/14/2014   H/O: CVA (cerebrovascular accident) 10/14/2014   NECK PAIN, CHRONIC 10/29/2010   Essential hypertension 10/10/2009   INSOMNIA 09/15/2009   Pure hypercholesterolemia 11/28/2008   MENOPAUSAL SYNDROME 11/28/2008    PCP: Betsy Coder MD  REFERRING PROVIDER: Mcarthur Rossetti, MD  REFERRING DIAG: [phone] (ICD-10-CM) - Status post left knee replacement M17.12 (ICD-10-CM) - Unilateral primary osteoarthritis, left knee  THERAPY DIAG:  Chronic pain of left knee  Muscle weakness (generalized)  Difficulty in walking, not elsewhere classified  Localized edema  Rationale for Evaluation and Treatment: Rehabilitation  ONSET DATE: 09/10/2022  SUBJECTIVE:   SUBJECTIVE STATEMENT: Pt indicated not hurting but "achy tight."  Pt indicated she's ready for it to stop being achy.   PERTINENT HISTORY: PMH: CVA with Rt hemiparesis, HTN, neurogenic  bladder  PAIN:  NPRS scale: 0/10 Pain location: Lt knee  Pain description: achy tightness Aggravating factors: end range pain, getting leg movement.  Relieving factors: avoid pain  PRECAUTIONS: None  WEIGHT BEARING RESTRICTIONS: No  FALLS:  Has patient fallen in last 6 months? 1 fall Do you have a fear of falling?  nope  LIVING ENVIRONMENT: Lives with: primary alone but has children staying at this time.  Lives in: House/apartment Stairs:5 to enter at front, 2 in back with  handrail on Lt  Has following equipment at home:FWW, SPC  OCCUPATION: Retired  PLOF: Independent, hobbies - read, exercise routine  PATIENT GOALS: Walking without cane or walker, strengthening.    OBJECTIVE:   PATIENT SURVEYS:  09/23/2022 FOTO intake: 33   predicted:  52  COGNITION: 09/23/2022 Overall cognitive status: WFL    EDEMA:  09/23/2022 Localized edema in Lt knee noted visually.   PALPATION: 09/23/2022 Mild tenderness joint line, posterior knee joint.   LOWER EXTREMITY MMT:   MMT Right 09/23/2022 Left 09/23/2022  Hip flexion 5/5 4/5  Hip extension    Hip abduction    Hip adduction    Hip internal rotation    Hip external rotation    Knee flexion 5/5 4/5  Knee extension 5/5 2/5  Ankle dorsiflexion 5/5 5/5  Ankle plantarflexion    Ankle inversion    Ankle eversion     (Blank rows = not tested)  LOWER EXTREMITY ROM:  ROM Right 09/23/2022 Left 09/23/2022 Left 10/01/2022 Left 10/09/2022  Hip flexion      Hip extension      Hip abduction      Hip adduction      Hip internal rotation      Hip external rotation      Knee flexion  80 AROM in supine heel slide 91 AROM in supine heel slide 105 AROM in supine heel slide  Knee extension  -10 AROM in seated LAQ.  -5 in supine quad set AROM  -8 AROM in quad set in supine  Ankle dorsiflexion      Ankle plantarflexion      Ankle inversion      Ankle eversion       (Blank rows = not tested)   FUNCTIONAL TESTS:  09/23/2022 18 inch chair transfer: unable without UE assist Lt SLS: unable unassisted   GAIT: 10/16/2022:  Mod independent c SPC within clinic  10/07/2022:  [place] use in clinic c supervision  09/23/2022 FWW ambulation c reduced stance on Lt leg, maintained knee flexion in stance.    TODAY'S TREATMENT                                                                          DATE:10/16/2022 Therex: UBE UE/LE only lvl 2.5 seat 9 8 mins Leg press Double leg 75 lbs x 15, Lt Single leg 2 x 15 37 lbs   Knee extension machine Double leg up, Lt leg lowering slowly 2 x 15 5 lbs  Lateral step up 4 inch c bilateral hand assist x 10  Incline gastroc runner stretch Lt leg posterior c manual overpressure for knee extension end range 30 sec x 5  Manual:   Seated Lt knee flexion  distraction c IR mobilization with movement.  Extension c ER mobilization c movement Lt knee  Vaso 10 mins Lt knee in elevation medium compression 34 degrees c heel prop stretch during first 5 mins  TODAY'S TREATMENT                                                                          DATE:10/14/2022 Therex: UBE UE/LE only lvl 2.5 seat 10 8 mins Leg press Double leg 75 lbs x 15, Lt Single leg 2 x 15 31 lbs  Seated AROM LAQ c pause in end ranges x 10 no weight, 5 lbs x 15 Seated Lt knee flexion AAROM with Rt leg overpressure 10 sec x 5 Step up fwd Lt leg WB with single hand use x 10 Seated SLR Lt x 10   Neuro Re-ed Tandem stance 1 min x 1 bilateral   4 inch step over foot clearing c fwd step/retro step for improved WB in ambulation, single hand Rt on bar c CGA x 10 each  Vaso 10 mins Lt knee in elevation medium compression 34 degrees c heel prop stretch during first 5 mins  TODAY'S TREATMENT                                                                          DATE:10/09/2022 Therex: UBE UE/LE only lvl 2.0 seat 10 7 mins Supine quad set 5 sec hold x 10  Leg press Double leg 62 lbs x 15, Single leg 2 x 15 25 lbs  Seated AROM LAQ c pause in end ranges x 15 no weight  2 x 15 4 lbs Seated Lt heel prop in chair 4 mins (cues for Rt leg crossed over for extra pressure)  Manual: Seated Lt knee flexion c distraction/IR mobilization c movement for mobiltiy gains.    Vaso 10 mins Lt knee in elevation medium compression 34 degrees  PATIENT EDUCATION:  09/23/2022 Education details: HEP, POC Person educated: Patient Education method: Consulting civil engineer, Demonstration, Verbal cues, and Handouts Education comprehension:  verbalized understanding, returned demonstration, and verbal cues required  HOME EXERCISE PROGRAM: Access Code: [email or web address] URL: [email or web address] Date: 09/23/2022 Prepared by: Scot Jun  Exercises - Seated Long Arc Quad (Mirrored)  - 3-5 x daily - 7 x weekly - 1-2 sets - 10 reps - 2 hold - Supine Heel Slide with Strap  - 3-5 x daily - 7 x weekly - 1-2 sets - 10 reps - 5 hold - Seated Quad Set (Mirrored)  - 3-5 x daily - 7 x weekly - 1 sets - 10 reps - 5 hold - Supine Knee Extension Mobilization with Weight (Mirrored)  - 3-5 x daily - 7 x weekly - 1 sets - 1 reps - to tolerance up to 15 mins hold  ASSESSMENT:  CLINICAL IMPRESSION: Continued necessity for skilled PT services and HEP to improve terminal knee extension both actively and passively.    OBJECTIVE IMPAIRMENTS: Abnormal gait, decreased activity tolerance,  decreased balance, decreased endurance, decreased mobility, difficulty walking, decreased ROM, decreased strength, hypomobility, increased edema, impaired flexibility, improper body mechanics, and pain.   ACTIVITY LIMITATIONS: carrying, lifting, bending, sitting, standing, squatting, sleeping, stairs, transfers, bed mobility, bathing, dressing, and locomotion level  PARTICIPATION LIMITATIONS: meal prep, cleaning, laundry, interpersonal relationship, driving, shopping, and community activity  PERSONAL FACTORS:  CVA with Rt hemiparesis, HTN, neurogenic bladder  are also affecting patient's functional outcome.   REHAB POTENTIAL: Good  CLINICAL DECISION MAKING: Stable/uncomplicated  EVALUATION COMPLEXITY: Low   GOALS: Goals reviewed with patient? Yes  SHORT TERM GOALS: (target date for Short term goals are 3 weeks 10/14/2022)   1.  Patient will demonstrate independent use of home exercise program to maintain progress from in clinic treatments.  Goal status: Met 10/07/2022  LONG TERM GOALS: (target dates for all long term goals are 10 weeks   12/02/2022 )   1. Patient will demonstrate/report pain at worst less than or equal to 2/10 to facilitate minimal limitation in daily activity secondary to pain symptoms.  Goal status: on going 10/07/2022   2. Patient will demonstrate independent use of home exercise program to facilitate ability to maintain/progress functional gains from skilled physical therapy services.  Goal status: on going 10/07/2022   3. Patient will demonstrate FOTO outcome > or = 52 % to indicate reduced disability due to condition.  Goal status: on going 10/07/2022   4.  Patient will demonstrate Lt  LE MMT 5/5 throughout to faciltiate usual transfers, stairs, squatting at [facility] for daily life.   Goal status: on going 10/07/2022   5.  Patient will demonstrate Lt knee AROM 0-110 deg to facilitate usual transfers, ambulation, and stairs at PLOF s limitation.  Goal status: on going 10/07/2022   6.  Patient will demonstrate independent ambulation community distances > 300 ft to facilitate community ambulation.  Goal status: on going 10/07/2022   7.  Patient will demonstrate ascending/descending stairs c reciprocal gait pattern for household navigation.  Goal Status: on going 10/07/2022   PLAN:  PT FREQUENCY: 2x/week  PT DURATION: 10 weeks  PLANNED INTERVENTIONS: Therapeutic exercises, Therapeutic activity, Neuro Muscular re-education, Balance training, Gait training, Patient/Family education, Joint mobilization, Stair training, DME instructions, Dry Needling, Electrical stimulation, Traction, Cryotherapy, vasopneumatic deviceMoist heat, Taping, Ultrasound, Ionotophoresis 42m/ml Dexamethasone, and Manual therapy.  All included unless contraindicated  PLAN FOR NEXT SESSION: Manual and there ex for extension gains.  Progressive quad strengthening, compliant surface balance.    MScot Jun PT, DPT, OCS, ATC 10/16/22  2:29 PM

## 2022-10-17 ENCOUNTER — Telehealth: Payer: Self-pay | Admitting: Internal Medicine

## 2022-10-17 NOTE — Telephone Encounter (Signed)
Faxed to [facility] order for repatha replacement pen x1

## 2022-10-21 ENCOUNTER — Ambulatory Visit (INDEPENDENT_AMBULATORY_CARE_PROVIDER_SITE_OTHER): Payer: Medicare PPO | Admitting: Rehabilitative and Restorative Service Providers"

## 2022-10-21 ENCOUNTER — Encounter: Payer: Self-pay | Admitting: Rehabilitative and Restorative Service Providers"

## 2022-10-21 DIAGNOSIS — R600 Localized edema: Secondary | ICD-10-CM | POA: Diagnosis not present

## 2022-10-21 DIAGNOSIS — M6281 Muscle weakness (generalized): Secondary | ICD-10-CM

## 2022-10-21 DIAGNOSIS — M25562 Pain in left knee: Secondary | ICD-10-CM

## 2022-10-21 DIAGNOSIS — R262 Difficulty in walking, not elsewhere classified: Secondary | ICD-10-CM

## 2022-10-21 DIAGNOSIS — G8929 Other chronic pain: Secondary | ICD-10-CM

## 2022-10-21 NOTE — Therapy (Signed)
OUTPATIENT PHYSICAL THERAPY TREATMENT   Patient Name: Teresa Martinez MRN: [id number] DOB:10/08/1941, 81 y.o., female Today's Date: 10/21/2022  END OF SESSION:   PT End of Session - 10/21/22 1402     Visit Number 9    Number of Visits 20    Date for PT Re-Evaluation 12/02/22    Authorization Type [employer] medicare $20 copay    Progress Note Due on Visit 10    PT Start Time 7591    PT Stop Time 1435    PT Time Calculation (min) 50 min    Equipment Utilized During Treatment Gait belt    Activity Tolerance Patient tolerated treatment well    Behavior During Therapy WFL for tasks assessed/performed                     Past Medical History:  Diagnosis Date   Cervical spine degeneration    C5/C6   Chronic right shoulder pain    GERD (gastroesophageal reflux disease)    Hemiparesis affecting dominant side as late effect of cerebrovascular accident ([place]) 12/07/2014   Hyperlipemia    Hypertension    Menopausal syndrome    [name]   Overactive bladder    Statin intolerance    Stroke ([place]) 2015   Past Surgical History:  Procedure Laterality Date   ABDOMINAL HYSTERECTOMY     1985   BAND HEMORRHOIDECTOMY     2010   CATARACT EXTRACTION, BILATERAL     CHOLECYSTECTOMY     1950   LUMBAR LAMINECTOMY Bilateral 08/03/2018   Dr. Octavio Manns, L3 laminotomy, L4-L5 laminectomy with neural foraminal decompression.   PAROTIDECTOMY     30 years ago   TOTAL KNEE ARTHROPLASTY Left 09/10/2022   Procedure: LEFT TOTAL KNEE ARTHROPLASTY;  Surgeon: Mcarthur Rossetti, MD;  Location: [facility];  Service: Orthopedics;  Laterality: Left;   Patient Active Problem List   Diagnosis Date Noted   Status post left knee replacement 09/10/2022   IGT (impaired glucose tolerance) 07/27/2021   Preop testing 07/09/2018   Degenerative lumbar spinal stenosis 05/12/2018   Statin myopathy 08/29/2016   Iliotibial band syndrome of right side 04/23/2016   Acromioclavicular  arthrosis 06/30/2015   Left carpal tunnel syndrome 05/15/2015   Adhesive capsulitis of right shoulder 02/06/2015   Dysarthria due to cerebrovascular accident 01/09/2015   Hemiparesis affecting dominant side as late effect of cerebrovascular accident ([place]) 12/07/2014   Spastic neurogenic bladder 10/31/2014   Right rotator cuff tendonitis 10/19/2014   Stenosis of cervical spine region    Chronic right shoulder pain    Cerebral infarction due to thrombosis of left middle cerebral artery ([place])    Left pontine stroke ([place])    TIA (transient ischemic attack) 10/14/2014   Overactive bladder 10/14/2014   H/O: CVA (cerebrovascular accident) 10/14/2014   NECK PAIN, CHRONIC 10/29/2010   Essential hypertension 10/10/2009   INSOMNIA 09/15/2009   Pure hypercholesterolemia 11/28/2008   MENOPAUSAL SYNDROME 11/28/2008    PCP: Betsy Coder MD  REFERRING PROVIDER: Mcarthur Rossetti, MD  REFERRING DIAG: [id number] (ICD-10-CM) - Status post left knee replacement M17.12 (ICD-10-CM) - Unilateral primary osteoarthritis, left knee  THERAPY DIAG:  Chronic pain of left knee  Muscle weakness (generalized)  Difficulty in walking, not elsewhere classified  Localized edema  Rationale for Evaluation and Treatment: Rehabilitation  ONSET DATE: 09/10/2022  SUBJECTIVE:   SUBJECTIVE STATEMENT: Pt indicated feeling stiff and achy still.  Reported nighttime and after exercise she can feel that.  PERTINENT HISTORY: PMH: CVA with Rt hemiparesis, HTN, neurogenic bladder  PAIN:  NPRS scale: 0/10 Pain location: Lt knee  Pain description: achy tightness Aggravating factors: end range pain, getting leg movement.  Relieving factors: avoid pain  PRECAUTIONS: None  WEIGHT BEARING RESTRICTIONS: No  FALLS:  Has patient fallen in last 6 months? 1 fall Do you have a fear of falling?  nope  LIVING ENVIRONMENT: Lives with: primary alone but has children staying at this time.  Lives in:  House/apartment Stairs:5 to enter at front, 2 in back with handrail on Lt  Has following equipment at home:FWW, SPC  OCCUPATION: Retired  PLOF: Independent, hobbies - read, exercise routine  PATIENT GOALS: Walking without cane or walker, strengthening.    OBJECTIVE:   PATIENT SURVEYS:  09/23/2022 FOTO intake: 33   predicted:  52  COGNITION: 09/23/2022 Overall cognitive status: WFL    EDEMA:  09/23/2022 Localized edema in Lt knee noted visually.   PALPATION: 09/23/2022 Mild tenderness joint line, posterior knee joint.   LOWER EXTREMITY MMT:   MMT Right 09/23/2022 Left 09/23/2022  Hip flexion 5/5 4/5  Hip extension    Hip abduction    Hip adduction    Hip internal rotation    Hip external rotation    Knee flexion 5/5 4/5  Knee extension 5/5 2/5  Ankle dorsiflexion 5/5 5/5  Ankle plantarflexion    Ankle inversion    Ankle eversion     (Blank rows = not tested)  LOWER EXTREMITY ROM:  ROM Right 09/23/2022 Left 09/23/2022 Left 10/01/2022 Left 10/09/2022  Hip flexion      Hip extension      Hip abduction      Hip adduction      Hip internal rotation      Hip external rotation      Knee flexion  80 AROM in supine heel slide 91 AROM in supine heel slide 105 AROM in supine heel slide  Knee extension  -10 AROM in seated LAQ.  -5 in supine quad set AROM  -8 AROM in quad set in supine  Ankle dorsiflexion      Ankle plantarflexion      Ankle inversion      Ankle eversion       (Blank rows = not tested)   FUNCTIONAL TESTS:  09/23/2022 18 inch chair transfer: unable without UE assist Lt SLS: unable unassisted   GAIT: 10/16/2022:  Mod independent c SPC within clinic  10/07/2022:  [place] use in clinic c supervision  09/23/2022 FWW ambulation c reduced stance on Lt leg, maintained knee flexion in stance.    TODAY'S TREATMENT                                                                          DATE:10/16/2022 Therex: Recumbent bike Lvl 1 8 mins partial to  full revolutions Leg press Double leg 75 lbs x 15, Lt Single leg 2 x 15 37 lbs  Knee extension machine Double leg up, Lt leg lowering slowly 2 x 10 10lbs  Step up 4 inch Lt leg WB with single hand assist x 10 c CGA with gait belt Lateral step up 4 inch c single  hand assist x 15  c CGA with gait belt Incline gastroc runner stretch Lt leg posterior c manual overpressure for knee extension end range 30 sec x 4  Neuro Re-ed Fitter rocker board fwd/back 20x light touch focus control no UE assist c CGA to min A c gait belt  Manual:   Seated Lt knee flexion distraction c IR mobilization with movement.  Extension c ER mobilization c movement Lt knee  Vaso 10 mins Lt knee in elevation medium compression 34 degrees c heel prop stretch during first 5 mins  TODAY'S TREATMENT                                                                          DATE:10/14/2022 Therex: UBE UE/LE only lvl 2.5 seat 10 8 mins Leg press Double leg 75 lbs x 15, Lt Single leg 2 x 15 31 lbs  Seated AROM LAQ c pause in end ranges x 10 no weight, 5 lbs x 15 Seated Lt knee flexion AAROM with Rt leg overpressure 10 sec x 5 Step up fwd Lt leg WB with single hand use x 10 Seated SLR Lt x 10   Neuro Re-ed Tandem stance 1 min x 1 bilateral   4 inch step over foot clearing c fwd step/retro step for improved WB in ambulation, single hand Rt on bar c CGA x 10 each  Vaso 10 mins Lt knee in elevation medium compression 34 degrees c heel prop stretch during first 5 mins  TODAY'S TREATMENT                                                                          DATE:10/09/2022 Therex: UBE UE/LE only lvl 2.0 seat 10 7 mins Supine quad set 5 sec hold x 10  Leg press Double leg 62 lbs x 15, Single leg 2 x 15 25 lbs  Seated AROM LAQ c pause in end ranges x 15 no weight  2 x 15 4 lbs Seated Lt heel prop in chair 4 mins (cues for Rt leg crossed over for extra pressure)  Manual: Seated Lt knee flexion c distraction/IR mobilization  c movement for mobiltiy gains.    Vaso 10 mins Lt knee in elevation medium compression 34 degrees  PATIENT EDUCATION:  09/23/2022 Education details: HEP, POC Person educated: Patient Education method: Consulting civil engineer, Demonstration, Verbal cues, and Handouts Education comprehension: verbalized understanding, returned demonstration, and verbal cues required  HOME EXERCISE PROGRAM: Access Code: [email or web address] URL: [email or web address] Date: 09/23/2022 Prepared by: Scot Jun  Exercises - Seated Long Arc Quad (Mirrored)  - 3-5 x daily - 7 x weekly - 1-2 sets - 10 reps - 2 hold - Supine Heel Slide with Strap  - 3-5 x daily - 7 x weekly - 1-2 sets - 10 reps - 5 hold - Seated Quad Set (Mirrored)  - 3-5 x daily - 7 x weekly - 1 sets - 10 reps - 5 hold - Supine Knee Extension Mobilization with Weight (Mirrored)  -  3-5 x daily - 7 x weekly - 1 sets - 1 reps - to tolerance up to 15 mins hold  ASSESSMENT:  CLINICAL IMPRESSION: Tightness upon arrival improved quickly with bike and manual intervention. In clinic attempts and independent gait showed times of Rt toe catch in swing from previous CVA.   OBJECTIVE IMPAIRMENTS: Abnormal gait, decreased activity tolerance, decreased balance, decreased endurance, decreased mobility, difficulty walking, decreased ROM, decreased strength, hypomobility, increased edema, impaired flexibility, improper body mechanics, and pain.   ACTIVITY LIMITATIONS: carrying, lifting, bending, sitting, standing, squatting, sleeping, stairs, transfers, bed mobility, bathing, dressing, and locomotion level  PARTICIPATION LIMITATIONS: meal prep, cleaning, laundry, interpersonal relationship, driving, shopping, and community activity  PERSONAL FACTORS:  CVA with Rt hemiparesis, HTN, neurogenic bladder  are also affecting patient's functional outcome.   REHAB POTENTIAL: Good  CLINICAL DECISION MAKING: Stable/uncomplicated  EVALUATION COMPLEXITY:  Low   GOALS: Goals reviewed with patient? Yes  SHORT TERM GOALS: (target date for Short term goals are 3 weeks 10/14/2022)   1.  Patient will demonstrate independent use of home exercise program to maintain progress from in clinic treatments.  Goal status: Met 10/07/2022  LONG TERM GOALS: (target dates for all long term goals are 10 weeks  12/02/2022 )   1. Patient will demonstrate/report pain at worst less than or equal to 2/10 to facilitate minimal limitation in daily activity secondary to pain symptoms.  Goal status: on going 10/07/2022   2. Patient will demonstrate independent use of home exercise program to facilitate ability to maintain/progress functional gains from skilled physical therapy services.  Goal status: on going 10/07/2022   3. Patient will demonstrate FOTO outcome > or = 52 % to indicate reduced disability due to condition.  Goal status: on going 10/07/2022   4.  Patient will demonstrate Lt  LE MMT 5/5 throughout to faciltiate usual transfers, stairs, squatting at [facility] for daily life.   Goal status: on going 10/07/2022   5.  Patient will demonstrate Lt knee AROM 0-110 deg to facilitate usual transfers, ambulation, and stairs at PLOF s limitation.  Goal status: on going 10/07/2022   6.  Patient will demonstrate independent ambulation community distances > 300 ft to facilitate community ambulation.  Goal status: on going 10/07/2022   7.  Patient will demonstrate ascending/descending stairs c reciprocal gait pattern for household navigation.  Goal Status: on going 10/07/2022   PLAN:  PT FREQUENCY: 2x/week  PT DURATION: 10 weeks  PLANNED INTERVENTIONS: Therapeutic exercises, Therapeutic activity, Neuro Muscular re-education, Balance training, Gait training, Patient/Family education, Joint mobilization, Stair training, DME instructions, Dry Needling, Electrical stimulation, Traction, Cryotherapy, vasopneumatic deviceMoist heat, Taping, Ultrasound,  Ionotophoresis 39m/ml Dexamethasone, and Manual therapy.  All included unless contraindicated  PLAN FOR NEXT SESSION: Manual and there ex for extension gains with strengthening, compliant surface balance.  Check FOTO/10th visit.  [employer] cert required at visit 1[place] PT, DPT, OCS, ATC 10/21/22  2:20 PM

## 2022-10-23 ENCOUNTER — Ambulatory Visit (INDEPENDENT_AMBULATORY_CARE_PROVIDER_SITE_OTHER): Payer: Medicare PPO | Admitting: Rehabilitative and Restorative Service Providers"

## 2022-10-23 ENCOUNTER — Encounter: Payer: Self-pay | Admitting: Rehabilitative and Restorative Service Providers"

## 2022-10-23 DIAGNOSIS — R600 Localized edema: Secondary | ICD-10-CM | POA: Diagnosis not present

## 2022-10-23 DIAGNOSIS — R262 Difficulty in walking, not elsewhere classified: Secondary | ICD-10-CM | POA: Diagnosis not present

## 2022-10-23 DIAGNOSIS — M25562 Pain in left knee: Secondary | ICD-10-CM | POA: Diagnosis not present

## 2022-10-23 DIAGNOSIS — M6281 Muscle weakness (generalized): Secondary | ICD-10-CM | POA: Diagnosis not present

## 2022-10-23 DIAGNOSIS — G8929 Other chronic pain: Secondary | ICD-10-CM

## 2022-10-23 NOTE — Therapy (Signed)
OUTPATIENT PHYSICAL THERAPY TREATMENT /PROGRESS NOTE   Patient Name: APPOLLONIA KLEE MRN: [id number] DOB:[birth year], 81 y.o., female Today's Date: 10/23/2022  Progress Note Reporting Period 09/23/2022 to 10/23/2022  See note below for Objective Data and Assessment of Progress/Goals.    END OF SESSION:   PT End of Session - 10/23/22 1347     Visit Number 10    Number of Visits 20    Date for PT Re-Evaluation 12/02/22    Authorization Type [employer] medicare $20 copay    Progress Note Due on Visit 10    PT Start Time 1342    PT Stop Time 1432    PT Time Calculation (min) 50 min    Equipment Utilized During Treatment Gait belt    Activity Tolerance Patient tolerated treatment well    Behavior During Therapy WFL for tasks assessed/performed                      Past Medical History:  Diagnosis Date   Cervical spine degeneration    C5/C6   Chronic right shoulder pain    GERD (gastroesophageal reflux disease)    Hemiparesis affecting dominant side as late effect of cerebrovascular accident ([place]) 12/07/2014   Hyperlipemia    Hypertension    Menopausal syndrome    [name]   Overactive bladder    Statin intolerance    Stroke ([place]) 2015   Past Surgical History:  Procedure Laterality Date   ABDOMINAL HYSTERECTOMY     1985   BAND HEMORRHOIDECTOMY     2010   CATARACT EXTRACTION, BILATERAL     CHOLECYSTECTOMY     1950   LUMBAR LAMINECTOMY Bilateral 08/03/2018   Dr. Octavio Manns, L3 laminotomy, L4-L5 laminectomy with neural foraminal decompression.   PAROTIDECTOMY     30 years ago   TOTAL KNEE ARTHROPLASTY Left 09/10/2022   Procedure: LEFT TOTAL KNEE ARTHROPLASTY;  Surgeon: Mcarthur Rossetti, MD;  Location: [facility];  Service: Orthopedics;  Laterality: Left;   Patient Active Problem List   Diagnosis Date Noted   Status post left knee replacement 09/10/2022   IGT (impaired glucose tolerance) 07/27/2021   Preop testing 07/09/2018    Degenerative lumbar spinal stenosis 05/12/2018   Statin myopathy 08/29/2016   Iliotibial band syndrome of right side 04/23/2016   Acromioclavicular arthrosis 06/30/2015   Left carpal tunnel syndrome 05/15/2015   Adhesive capsulitis of right shoulder 02/06/2015   Dysarthria due to cerebrovascular accident 01/09/2015   Hemiparesis affecting dominant side as late effect of cerebrovascular accident ([place]) 12/07/2014   Spastic neurogenic bladder 10/31/2014   Right rotator cuff tendonitis 10/19/2014   Stenosis of cervical spine region    Chronic right shoulder pain    Cerebral infarction due to thrombosis of left middle cerebral artery ([place])    Left pontine stroke ([place])    TIA (transient ischemic attack) 10/14/2014   Overactive bladder 10/14/2014   H/O: CVA (cerebrovascular accident) 10/14/2014   NECK PAIN, CHRONIC 10/29/2010   Essential hypertension 10/10/2009   INSOMNIA 09/15/2009   Pure hypercholesterolemia 11/28/2008   MENOPAUSAL SYNDROME 11/28/2008    PCP: Betsy Coder MD  REFERRING PROVIDER: Mcarthur Rossetti, MD  REFERRING DIAG: [phone] (ICD-10-CM) - Status post left knee replacement M17.12 (ICD-10-CM) - Unilateral primary osteoarthritis, left knee  THERAPY DIAG:  Chronic pain of left knee  Muscle weakness (generalized)  Difficulty in walking, not elsewhere classified  Localized edema  Rationale for Evaluation and Treatment: Rehabilitation  ONSET DATE: 09/10/2022  SUBJECTIVE:   SUBJECTIVE STATEMENT: Pt indicated some quick pains/pulling in posterior/lateral knee today.  Overall Pt indicated quite a bit better +5 overall.  Pt indicated pain can increase with prolonged walking/sitting.   PERTINENT HISTORY: PMH: CVA with Rt hemiparesis, HTN, neurogenic bladder  PAIN:  NPRS scale: past at worst in last few days 3/10 Pain location: Lt knee  Pain description: achy tightness Aggravating factors: end range pain, ache all the time Relieving factors:  avoid pain  PRECAUTIONS: None  WEIGHT BEARING RESTRICTIONS: No  FALLS:  Has patient fallen in last 6 months? 1 fall Do you have a fear of falling?  nope  LIVING ENVIRONMENT: Lives with: primary alone but has children staying at this time.  Lives in: House/apartment Stairs:5 to enter at front, 2 in back with handrail on Lt  Has following equipment at home:FWW, SPC  OCCUPATION: Retired  PLOF: Independent, hobbies - read, exercise routine  PATIENT GOALS: Walking without cane or walker, strengthening.    OBJECTIVE:   PATIENT SURVEYS:  10/23/2022:  FOTO update:  33  09/23/2022 FOTO intake: 33   predicted:  52  COGNITION: 09/23/2022 Overall cognitive status: WFL    EDEMA:  09/23/2022 Localized edema in Lt knee noted visually.   PALPATION: 09/23/2022 Mild tenderness joint line, posterior knee joint.   LOWER EXTREMITY MMT:   MMT Right 09/23/2022 Left 09/23/2022 Right 10/23/2022 Left 10/23/2022  Hip flexion 5/5 4/5    Hip extension      Hip abduction      Hip adduction      Hip internal rotation      Hip external rotation      Knee flexion 5/5 4/5  5/5  Knee extension 5/5 2/5 5/5 38, 39.6 lbs 5/5 35, 37 lbs  Ankle dorsiflexion 5/5 5/5    Ankle plantarflexion      Ankle inversion      Ankle eversion       (Blank rows = not tested)  LOWER EXTREMITY ROM:  ROM Right 09/23/2022 Left 09/23/2022 Left 10/01/2022 Left 10/09/2022 Left 10/23/2022  Hip flexion       Hip extension       Hip abduction       Hip adduction       Hip internal rotation       Hip external rotation       Knee flexion  80 AROM in supine heel slide 91 AROM in supine heel slide 105 AROM in supine heel slide 114 AROM in supine heel slide  Knee extension  -10 AROM in seated LAQ.  -5 in supine quad set AROM  -8 AROM in quad set in supine -6 AROM in seated LAQ  Ankle dorsiflexion       Ankle plantarflexion       Ankle inversion       Ankle eversion        (Blank rows = not  tested)   FUNCTIONAL TESTS:    09/23/2022 18 inch chair transfer: unable without UE assist Lt SLS: unable unassisted   GAIT: 10/23/2022:  Mod independent c SPC.  Times of Rt foot toe drag in swing.  Lt knee Lacking end range in stance  10/16/2022:  Mod independent c SPC within clinic  10/07/2022:  [place] use in clinic c supervision  09/23/2022 FWW ambulation c reduced stance on Lt leg, maintained knee flexion in stance.    TODAY'S TREATMENT  DATE:10/16/2022 Therex: Recumbent bike Lvl 1 8 mins partial to full revolutions Leg press Double leg 75 lbs x 15, Lt Single leg 2 x 15 37 lbs  Knee extension machine Double leg up, Lt leg lowering slowly 2 x 10 15 lbs  Step up 4 inch Lt leg WB with single hand assist x 15 c CGA with gait belt Lateral step up 4 inch c single  hand assist x 15 c CGA with gait belt  Neuo Re-ed Pre gait step over 4 inch step c fwd/retro step weight shifting x 15 bilateral Tandem stance 1 min x 1 bilateral  Manual:   Seated Lt knee flexion distraction c IR mobilization with movement.  Extension c ER mobilization c movement Lt knee  Vaso 10 mins Lt knee in elevation medium compression 34 degrees c heel prop stretch during first 5 mins  TODAY'S TREATMENT                                                                          DATE:10/14/2022 Therex: UBE UE/LE only lvl 2.5 seat 10 8 mins Leg press Double leg 75 lbs x 15, Lt Single leg 2 x 15 31 lbs  Seated AROM LAQ c pause in end ranges x 10 no weight, 5 lbs x 15 Seated Lt knee flexion AAROM with Rt leg overpressure 10 sec x 5 Step up fwd Lt leg WB with single hand use x 10 Seated SLR Lt x 10   Neuro Re-ed Tandem stance 1 min x 1 bilateral   4 inch step over foot clearing c fwd step/retro step for improved WB in ambulation, single hand Rt on bar c CGA x 10 each  Vaso 10 mins Lt knee in elevation medium compression 34 degrees c heel  prop stretch during first 5 mins  TODAY'S TREATMENT                                                                          DATE:10/09/2022 Therex: UBE UE/LE only lvl 2.0 seat 10 7 mins Supine quad set 5 sec hold x 10  Leg press Double leg 62 lbs x 15, Single leg 2 x 15 25 lbs  Seated AROM LAQ c pause in end ranges x 15 no weight  2 x 15 4 lbs Seated Lt heel prop in chair 4 mins (cues for Rt leg crossed over for extra pressure)  Manual: Seated Lt knee flexion c distraction/IR mobilization c movement for mobiltiy gains.    Vaso 10 mins Lt knee in elevation medium compression 34 degrees  PATIENT EDUCATION:  09/23/2022 Education details: HEP, POC Person educated: Patient Education method: Consulting civil engineer, Demonstration, Verbal cues, and Handouts Education comprehension: verbalized understanding, returned demonstration, and verbal cues required  HOME EXERCISE PROGRAM: Access Code: [email or web address] URL: [email or web address] Date: 09/23/2022 Prepared by: Scot Jun  Exercises - Seated Long Arc Quad (Mirrored)  - 3-5 x daily - 7 x weekly - 1-2 sets - 10 reps -  2 hold - Supine Heel Slide with Strap  - 3-5 x daily - 7 x weekly - 1-2 sets - 10 reps - 5 hold - Seated Quad Set (Mirrored)  - 3-5 x daily - 7 x weekly - 1 sets - 10 reps - 5 hold - Supine Knee Extension Mobilization with Weight (Mirrored)  - 3-5 x daily - 7 x weekly - 1 sets - 1 reps - to tolerance up to 15 mins hold  ASSESSMENT:  CLINICAL IMPRESSION: Pt has attended 10 visits overall during course of treatment.  See objective data for updated information.  Good progress noted in all areas measured showing gains towards goals.  Continued skilled PT services indicated at this time due to remaining impairments.    OBJECTIVE IMPAIRMENTS: Abnormal gait, decreased activity tolerance, decreased balance, decreased endurance, decreased mobility, difficulty walking, decreased ROM, decreased strength, hypomobility, increased  edema, impaired flexibility, improper body mechanics, and pain.   ACTIVITY LIMITATIONS: carrying, lifting, bending, sitting, standing, squatting, sleeping, stairs, transfers, bed mobility, bathing, dressing, and locomotion level  PARTICIPATION LIMITATIONS: meal prep, cleaning, laundry, interpersonal relationship, driving, shopping, and community activity  PERSONAL FACTORS:  CVA with Rt hemiparesis, HTN, neurogenic bladder  are also affecting patient's functional outcome.   REHAB POTENTIAL: Good  CLINICAL DECISION MAKING: Stable/uncomplicated  EVALUATION COMPLEXITY: Low   GOALS: Goals reviewed with patient? Yes  SHORT TERM GOALS: (target date for Short term goals are 3 weeks 10/14/2022)   1.  Patient will demonstrate independent use of home exercise program to maintain progress from in clinic treatments.  Goal status: Met 10/07/2022  LONG TERM GOALS: (target dates for all long term goals are 10 weeks  12/02/2022 )   1. Patient will demonstrate/report pain at worst less than or equal to 2/10 to facilitate minimal limitation in daily activity secondary to pain symptoms.  Goal status: on going 10/23/2022   2. Patient will demonstrate independent use of home exercise program to facilitate ability to maintain/progress functional gains from skilled physical therapy services.  Goal status: on going 10/23/2022   3. Patient will demonstrate FOTO outcome > or = 52 % to indicate reduced disability due to condition.  Goal status: on going 10/23/2022   4.  Patient will demonstrate Lt  LE MMT 5/5 throughout to faciltiate usual transfers, stairs, squatting at [facility] for daily life.   Goal status: Met strength 5/5 10/23/2022   5.  Patient will demonstrate Lt knee AROM 0-110 deg to facilitate usual transfers, ambulation, and stairs at PLOF s limitation.  Goal status: on going 10/23/2022   6.  Patient will demonstrate independent ambulation community distances > 300 ft to facilitate community  ambulation.  Goal status: on going 10/23/2022   7.  Patient will demonstrate ascending/descending stairs c reciprocal gait pattern for household navigation.  Goal Status: on going 10/23/2022   PLAN:  PT FREQUENCY: 2x/week  PT DURATION: 10 weeks  PLANNED INTERVENTIONS: Therapeutic exercises, Therapeutic activity, Neuro Muscular re-education, Balance training, Gait training, Patient/Family education, Joint mobilization, Stair training, DME instructions, Dry Needling, Electrical stimulation, Traction, Cryotherapy, vasopneumatic deviceMoist heat, Taping, Ultrasound, Ionotophoresis 21m/ml Dexamethasone, and Manual therapy.  All included unless contraindicated  PLAN FOR NEXT SESSION: Manual and there ex for extension gains with strengthening, compliant surface balance.  Check FOTO/10th visit.  [employer] cert required at visit 1[place] PT, DPT, OCS, ATC 10/23/22  2:20 PM

## 2022-10-24 ENCOUNTER — Ambulatory Visit (INDEPENDENT_AMBULATORY_CARE_PROVIDER_SITE_OTHER): Payer: Medicare PPO | Admitting: Orthopaedic Surgery

## 2022-10-24 ENCOUNTER — Encounter: Payer: Self-pay | Admitting: Orthopaedic Surgery

## 2022-10-24 DIAGNOSIS — Z96652 Presence of left artificial knee joint: Secondary | ICD-10-CM

## 2022-10-24 NOTE — Progress Notes (Signed)
The patient is 81 year old who is now 6 weeks status post a left total knee arthroplasty.  She feels like she is not where she wants to be at this point but I gave her reassurance she is doing great.  Her extension is getting close to full but her flexion is excellent.  Her swelling is what is to be expected at 6 weeks.  Still swollen and that is normal and I told her that that would take a while for all the swelling to go away.  The knee feels ligamentously stable.  She is ready to drive.  She examined with a cane.  I gave her reassurance that she looks great from my standpoint.  I do feel that she would benefit from continuing therapy through the month of December.  I will see her back in 4 weeks to see how she is doing overall but no x-rays are needed.

## 2022-10-28 ENCOUNTER — Ambulatory Visit (INDEPENDENT_AMBULATORY_CARE_PROVIDER_SITE_OTHER): Payer: Medicare PPO | Admitting: Physical Therapy

## 2022-10-28 ENCOUNTER — Encounter: Payer: Self-pay | Admitting: Physical Therapy

## 2022-10-28 DIAGNOSIS — M25562 Pain in left knee: Secondary | ICD-10-CM

## 2022-10-28 DIAGNOSIS — R262 Difficulty in walking, not elsewhere classified: Secondary | ICD-10-CM

## 2022-10-28 DIAGNOSIS — G8929 Other chronic pain: Secondary | ICD-10-CM | POA: Diagnosis not present

## 2022-10-28 DIAGNOSIS — R600 Localized edema: Secondary | ICD-10-CM

## 2022-10-28 DIAGNOSIS — M6281 Muscle weakness (generalized): Secondary | ICD-10-CM | POA: Diagnosis not present

## 2022-10-28 NOTE — Therapy (Signed)
OUTPATIENT PHYSICAL THERAPY TREATMENT    Patient Name: Teresa Martinez MRN: [id number] DOB:[birth year], 81 y.o., female Today's Date: 10/28/2022    END OF SESSION:   PT End of Session - 10/28/22 1348     Visit Number 11    Number of Visits 20    Date for PT Re-Evaluation 12/02/22    Authorization Type [employer] medicare $20 copay    Progress Note Due on Visit 10    PT Start Time 4098    PT Stop Time 1430    PT Time Calculation (min) 45 min    Equipment Utilized During Treatment Gait belt    Activity Tolerance Patient tolerated treatment well    Behavior During Therapy WFL for tasks assessed/performed                      Past Medical History:  Diagnosis Date   Cervical spine degeneration    C5/C6   Chronic right shoulder pain    GERD (gastroesophageal reflux disease)    Hemiparesis affecting dominant side as late effect of cerebrovascular accident ([place]) 12/07/2014   Hyperlipemia    Hypertension    Menopausal syndrome    [name]   Overactive bladder    Statin intolerance    Stroke ([place]) 2015   Past Surgical History:  Procedure Laterality Date   ABDOMINAL HYSTERECTOMY     1985   BAND HEMORRHOIDECTOMY     2010   CATARACT EXTRACTION, BILATERAL     CHOLECYSTECTOMY     1950   LUMBAR LAMINECTOMY Bilateral 08/03/2018   Dr. Octavio Manns, L3 laminotomy, L4-L5 laminectomy with neural foraminal decompression.   PAROTIDECTOMY     30 years ago   TOTAL KNEE ARTHROPLASTY Left 09/10/2022   Procedure: LEFT TOTAL KNEE ARTHROPLASTY;  Surgeon: Mcarthur Rossetti, MD;  Location: [facility];  Service: Orthopedics;  Laterality: Left;   Patient Active Problem List   Diagnosis Date Noted   Status post left knee replacement 09/10/2022   IGT (impaired glucose tolerance) 07/27/2021   Preop testing 07/09/2018   Degenerative lumbar spinal stenosis 05/12/2018   Statin myopathy 08/29/2016   Iliotibial band syndrome of right side 04/23/2016   Acromioclavicular  arthrosis 06/30/2015   Left carpal tunnel syndrome 05/15/2015   Adhesive capsulitis of right shoulder 02/06/2015   Dysarthria due to cerebrovascular accident 01/09/2015   Hemiparesis affecting dominant side as late effect of cerebrovascular accident ([place]) 12/07/2014   Spastic neurogenic bladder 10/31/2014   Right rotator cuff tendonitis 10/19/2014   Stenosis of cervical spine region    Chronic right shoulder pain    Cerebral infarction due to thrombosis of left middle cerebral artery ([place])    Left pontine stroke ([place])    TIA (transient ischemic attack) 10/14/2014   Overactive bladder 10/14/2014   H/O: CVA (cerebrovascular accident) 10/14/2014   NECK PAIN, CHRONIC 10/29/2010   Essential hypertension 10/10/2009   INSOMNIA 09/15/2009   Pure hypercholesterolemia 11/28/2008   MENOPAUSAL SYNDROME 11/28/2008    PCP: Betsy Coder MD  REFERRING PROVIDER: Mcarthur Rossetti, MD  REFERRING DIAG: [phone] (ICD-10-CM) - Status post left knee replacement M17.12 (ICD-10-CM) - Unilateral primary osteoarthritis, left knee  THERAPY DIAG:  Chronic pain of left knee  Muscle weakness (generalized)  Difficulty in walking, not elsewhere classified  Localized edema  Rationale for Evaluation and Treatment: Rehabilitation  ONSET DATE: 09/10/2022  SUBJECTIVE:   SUBJECTIVE STATEMENT: Pt indicated soreness more so than pain in her knee.   PERTINENT HISTORY: PMH:  CVA with Rt hemiparesis, HTN, neurogenic bladder  PAIN:  NPRS scale: past at worst in last few days 3/10 Pain location: Lt knee  Pain description: achy tightness Aggravating factors: end range pain, ache all the time Relieving factors: avoid pain  PRECAUTIONS: None  WEIGHT BEARING RESTRICTIONS: No  FALLS:  Has patient fallen in last 6 months? 1 fall Do you have a fear of falling?  nope  LIVING ENVIRONMENT: Lives with: primary alone but has children staying at this time.  Lives in: House/apartment Stairs:5  to enter at front, 2 in back with handrail on Lt  Has following equipment at home:FWW, SPC  OCCUPATION: Retired  PLOF: Independent, hobbies - read, exercise routine  PATIENT GOALS: Walking without cane or walker, strengthening.    OBJECTIVE:   PATIENT SURVEYS:  10/23/2022:  FOTO update:  33  09/23/2022 FOTO intake: 33   predicted:  52  COGNITION: 09/23/2022 Overall cognitive status: WFL    EDEMA:  09/23/2022 Localized edema in Lt knee noted visually.   PALPATION: 09/23/2022 Mild tenderness joint line, posterior knee joint.   LOWER EXTREMITY MMT:   MMT Right 09/23/2022 Left 09/23/2022 Right 10/23/2022 Left 10/23/2022  Hip flexion 5/5 4/5    Hip extension      Hip abduction      Hip adduction      Hip internal rotation      Hip external rotation      Knee flexion 5/5 4/5  5/5  Knee extension 5/5 2/5 5/5 38, 39.6 lbs 5/5 35, 37 lbs  Ankle dorsiflexion 5/5 5/5    Ankle plantarflexion      Ankle inversion      Ankle eversion       (Blank rows = not tested)  LOWER EXTREMITY ROM:  ROM Right 09/23/2022 Left 09/23/2022 Left 10/01/2022 Left 10/09/2022 Left 10/23/2022  Hip flexion       Hip extension       Hip abduction       Hip adduction       Hip internal rotation       Hip external rotation       Knee flexion  80 AROM in supine heel slide 91 AROM in supine heel slide 105 AROM in supine heel slide 114 AROM in supine heel slide  Knee extension  -10 AROM in seated LAQ.  -5 in supine quad set AROM  -8 AROM in quad set in supine -6 AROM in seated LAQ  Ankle dorsiflexion       Ankle plantarflexion       Ankle inversion       Ankle eversion        (Blank rows = not tested)   FUNCTIONAL TESTS:    09/23/2022 18 inch chair transfer: unable without UE assist Lt SLS: unable unassisted   GAIT: 10/23/2022:  Mod independent c SPC.  Times of Rt foot toe drag in swing.  Lt knee Lacking end range in stance  10/16/2022:  Mod independent c SPC within  clinic  10/07/2022:  [place] use in clinic c supervision  09/23/2022 FWW ambulation c reduced stance on Lt leg, maintained knee flexion in stance.    TODAY'S TREATMENT  DATE:10/28/2022 Therex: Sci fit bike seat #7 Lvl 5 10 mins full revolutions Leg press Double leg 81 lbs 2 x 10, Lt Single leg 2 x 15 43 lbs  Knee extension machine Double leg up, Lt leg lowering slowly 2 x 10 15 lbs  Seated hamstring curl machine DL 25# 2X10 Step up 6 inch Lt leg WB with single hand assist x 10  Lateral step up 6 inch c bilat UE assist x 10  Neuo Re-ed Tandem stance 1 min x 1 bilateral Balance on foam pad feet together 1 min Balance on foam pad feet almost together and head turns X 10 bilat  Manual:   Supine manual hamstring stretching, Lt knee PROM flexion and extension with overpressure, knee extension mobs grade 3  Vaso 10 mins Lt knee in elevation medium compression 34 degrees c heel prop stretch during first 5 mins  DATE:10/16/2022 Therex: Recumbent bike Lvl 1 8 mins partial to full revolutions Leg press Double leg 75 lbs x 15, Lt Single leg 2 x 15 37 lbs  Knee extension machine Double leg up, Lt leg lowering slowly 2 x 10 15 lbs  Step up 4 inch Lt leg WB with single hand assist x 15 c CGA with gait belt Lateral step up 4 inch c single  hand assist x 15 c CGA with gait belt  Neuo Re-ed Pre gait step over 4 inch step c fwd/retro step weight shifting x 15 bilateral Tandem stance 1 min x 1 bilateral  Manual:   Seated Lt knee flexion distraction c IR mobilization with movement.  Extension c ER mobilization c movement Lt knee  Vaso 10 mins Lt knee in elevation medium compression 34 degrees c heel prop stretch during first 5 mins   PATIENT EDUCATION:  09/23/2022 Education details: HEP, POC Person educated: Patient Education method: Consulting civil engineer, Media planner, Verbal cues, and Handouts Education comprehension:  verbalized understanding, returned demonstration, and verbal cues required  HOME EXERCISE PROGRAM: Access Code: [email or web address] URL: [email or web address] Date: 09/23/2022 Prepared by: Scot Jun  Exercises - Seated Long Arc Quad (Mirrored)  - 3-5 x daily - 7 x weekly - 1-2 sets - 10 reps - 2 hold - Supine Heel Slide with Strap  - 3-5 x daily - 7 x weekly - 1-2 sets - 10 reps - 5 hold - Seated Quad Set (Mirrored)  - 3-5 x daily - 7 x weekly - 1 sets - 10 reps - 5 hold - Supine Knee Extension Mobilization with Weight (Mirrored)  - 3-5 x daily - 7 x weekly - 1 sets - 1 reps - to tolerance up to 15 mins hold  ASSESSMENT:  CLINICAL IMPRESSION: She is progressing well overall. She appeared to have good MD follow up note. PT added more balance challenges today as she does still lack in this area.     OBJECTIVE IMPAIRMENTS: Abnormal gait, decreased activity tolerance, decreased balance, decreased endurance, decreased mobility, difficulty walking, decreased ROM, decreased strength, hypomobility, increased edema, impaired flexibility, improper body mechanics, and pain.   ACTIVITY LIMITATIONS: carrying, lifting, bending, sitting, standing, squatting, sleeping, stairs, transfers, bed mobility, bathing, dressing, and locomotion level  PARTICIPATION LIMITATIONS: meal prep, cleaning, laundry, interpersonal relationship, driving, shopping, and community activity  PERSONAL FACTORS:  CVA with Rt hemiparesis, HTN, neurogenic bladder  are also affecting patient's functional outcome.   REHAB POTENTIAL: Good  CLINICAL DECISION MAKING: Stable/uncomplicated  EVALUATION COMPLEXITY: Low   GOALS: Goals reviewed with patient? Yes  SHORT TERM GOALS: (target date for Short term  goals are 3 weeks 10/14/2022)   1.  Patient will demonstrate independent use of home exercise program to maintain progress from in clinic treatments.  Goal status: Met 10/07/2022  LONG TERM GOALS: (target dates for  all long term goals are 10 weeks  12/02/2022 )   1. Patient will demonstrate/report pain at worst less than or equal to 2/10 to facilitate minimal limitation in daily activity secondary to pain symptoms.  Goal status: on going 10/23/2022   2. Patient will demonstrate independent use of home exercise program to facilitate ability to maintain/progress functional gains from skilled physical therapy services.  Goal status: on going 10/23/2022   3. Patient will demonstrate FOTO outcome > or = 52 % to indicate reduced disability due to condition.  Goal status: on going 10/23/2022   4.  Patient will demonstrate Lt  LE MMT 5/5 throughout to faciltiate usual transfers, stairs, squatting at [facility] for daily life.   Goal status: Met strength 5/5 10/23/2022   5.  Patient will demonstrate Lt knee AROM 0-110 deg to facilitate usual transfers, ambulation, and stairs at PLOF s limitation.  Goal status: on going 10/23/2022   6.  Patient will demonstrate independent ambulation community distances > 300 ft to facilitate community ambulation.  Goal status: on going 10/23/2022   7.  Patient will demonstrate ascending/descending stairs c reciprocal gait pattern for household navigation.  Goal Status: on going 10/23/2022   PLAN:  PT FREQUENCY: 2x/week  PT DURATION: 10 weeks  PLANNED INTERVENTIONS: Therapeutic exercises, Therapeutic activity, Neuro Muscular re-education, Balance training, Gait training, Patient/Family education, Joint mobilization, Stair training, DME instructions, Dry Needling, Electrical stimulation, Traction, Cryotherapy, vasopneumatic deviceMoist heat, Taping, Ultrasound, Ionotophoresis 65m/ml Dexamethasone, and Manual therapy.  All included unless contraindicated  PLAN FOR NEXT SESSION: Manual and there ex for extension gains with strengthening, compliant surface balance.  [employer] cert required at visit 13

## 2022-10-29 ENCOUNTER — Ambulatory Visit: Payer: Medicare PPO | Admitting: Internal Medicine

## 2022-10-29 VITALS — BP 156/80 | HR 98 | Temp 98.5°F | Wt 138.4 lb

## 2022-10-29 DIAGNOSIS — R7302 Impaired glucose tolerance (oral): Secondary | ICD-10-CM

## 2022-10-29 DIAGNOSIS — I10 Essential (primary) hypertension: Secondary | ICD-10-CM

## 2022-10-29 DIAGNOSIS — E7800 Pure hypercholesterolemia, unspecified: Secondary | ICD-10-CM | POA: Diagnosis not present

## 2022-10-29 LAB — POCT GLYCOSYLATED HEMOGLOBIN (HGB A1C): Hemoglobin A1C: 5.5 % (ref 4.0–5.6)

## 2022-10-29 NOTE — Progress Notes (Signed)
Established Patient Office Visit     CC/Reason for Visit: Follow-up chronic conditions  HPI: Teresa Martinez is a 81 y.o. female who is coming in today for the above mentioned reasons. Past Medical History is significant for: Hypertension, hyperlipidemia on evolocumab followed by lipid clinic and impaired glucose tolerance.  In October she had a left total knee replacement.  Recovery has been slow and riddled with pain as she does not tolerate narcotics well.  She brings in ambulatory blood pressure measurements.  Systolics are usually in the mid to high 130s to 140s with diastolics in the 70s to 80s.   Past Medical/Surgical History: Past Medical History:  Diagnosis Date   Cervical spine degeneration    C5/C6   Chronic right shoulder pain    GERD (gastroesophageal reflux disease)    Hemiparesis affecting dominant side as late effect of cerebrovascular accident ([facility]) 12/07/2014   Hyperlipemia    Hypertension    Menopausal syndrome    Teresa Martinez)   Overactive bladder    Statin intolerance    Stroke [facility]) 2015    Past Surgical History:  Procedure Laterality Date   ABDOMINAL HYSTERECTOMY     1985   BAND HEMORRHOIDECTOMY     2010   CATARACT EXTRACTION, BILATERAL     CHOLECYSTECTOMY     1950   LUMBAR LAMINECTOMY Bilateral 08/03/2018   Dr. Christain Martinez, L3 laminotomy, L4-L5 laminectomy with neural foraminal decompression.   PAROTIDECTOMY     30 years ago   TOTAL KNEE ARTHROPLASTY Left 09/10/2022   Procedure: LEFT TOTAL KNEE ARTHROPLASTY;  Surgeon: Teresa Hitch, MD;  Location: [facility];  Service: Orthopedics;  Laterality: Left;    Social History:  reports that she has never smoked. She has never used smokeless tobacco. She reports that she does not drink alcohol and does not use drugs.  Allergies: Allergies  Allergen Reactions   Statins Other (See Comments)    Muscle pain and unable to walk Muscle pain and unable to walk   Wasp Venom    Latex  Other (See Comments)    Burns her skin    Family History:  Family History  Problem Relation Age of Onset   Heart failure Father    Diabetes Mother    Hypertension Mother    Stroke Mother    Cirrhosis Sister    Diabetes Sister    Hyperlipidemia Brother    Hypertension Brother    Stroke Brother    Diabetes Brother      Current Outpatient Medications:    aspirin 81 MG chewable tablet, Chew 1 tablet (81 mg total) by mouth 2 (two) times daily., Disp: 30 tablet, Rfl: 0   Calcium Carbonate-Vit D-Min (CALTRATE PLUS PO), Take 1 tablet by mouth daily., Disp: , Rfl:    ezetimibe (ZETIA) 10 MG tablet, TAKE 1 TABLET(10 MG) BY MOUTH DAILY, Disp: 90 tablet, Rfl: 1   fesoterodine (TOVIAZ) 4 MG TB24 tablet, TAKE 1 TABLET(4 MG) BY MOUTH DAILY, Disp: 90 tablet, Rfl: 1   lisinopril (ZESTRIL) 10 MG tablet, TAKE 1 TABLET(10 MG) BY MOUTH DAILY, Disp: 90 tablet, Rfl: 1   Multiple Vitamin (MULTIVITAMIN WITH MINERALS) TABS tablet, Take 1 tablet by mouth daily., Disp: , Rfl:    REPATHA SURECLICK 140 MG/ML SOAJ, INJECT 1 DOSE INTO SKIN EVERY 14(FOURTEEN) DAYS, Disp: 2 mL, Rfl: 11   methocarbamol (ROBAXIN) 500 MG tablet, Take 1 tablet (500 mg total) by mouth every 6 (six) hours as needed. (Patient not  taking: Reported on 10/29/2022), Disp: 40 tablet, Rfl: 1   pantoprazole (PROTONIX) 40 MG tablet, Take 1 tablet (40 mg total) by mouth daily. (Patient not taking: Reported on 10/29/2022), Disp: 90 tablet, Rfl: 1   traMADol (ULTRAM) 50 MG tablet, Take 1 tablet (50 mg total) by mouth every 6 (six) hours as needed for severe pain. (Patient not taking: Reported on 10/29/2022), Disp: 30 tablet, Rfl: 0  Review of Systems:  Constitutional: Denies fever, chills, diaphoresis, appetite change and fatigue.  HEENT: Denies photophobia, eye pain, redness, hearing loss, ear pain, congestion, sore throat, rhinorrhea, sneezing, mouth sores, trouble swallowing, neck pain, neck stiffness and tinnitus.   Respiratory: Denies SOB, DOE,  cough, chest tightness,  and wheezing.   Cardiovascular: Denies chest pain, palpitations and leg swelling.  Gastrointestinal: Denies nausea, vomiting, abdominal pain, diarrhea, constipation, blood in stool and abdominal distention.  Genitourinary: Denies dysuria, urgency, frequency, hematuria, flank pain and difficulty urinating.  Endocrine: Denies: hot or cold intolerance, sweats, changes in hair or nails, polyuria, polydipsia.  Skin: Denies pallor, rash and wound.  Neurological: Denies dizziness, seizures, syncope, weakness, light-headedness, numbness and headaches.  Hematological: Denies adenopathy. Easy bruising, personal or family bleeding history  Psychiatric/Behavioral: Denies suicidal ideation, mood changes, confusion, nervousness, sleep disturbance and agitation    Physical Exam: Vitals:   10/29/22 1253 10/29/22 1257  BP: (!) 140/90 (!) 156/80  Pulse: 98   Temp: 98.5 F (36.9 C)   TempSrc: Oral   SpO2: 100%   Weight: 138 lb 6.4 oz (62.8 kg)     Body mass index is 22.34 kg/m.   Constitutional: NAD, calm, comfortable, ambulating with a cane Eyes: PERRL, lids and conjunctivae normal, wears corrective lenses ENMT: Mucous membranes are moist.  Respiratory: clear to auscultation bilaterally, no wheezing, no crackles. Normal respiratory effort. No accessory muscle use.  Cardiovascular: Regular rate and rhythm, no murmurs / rubs / gallops. No extremity edema.  Psychiatric: Normal judgment and insight. Alert and oriented x 3. Normal mood.    Impression and Plan:  IGT (impaired glucose tolerance) - Plan: POCT glycosylated hemoglobin (Hb A1C)  Essential hypertension  Pure hypercholesterolemia  -A1c is stable to improved at 5.5. -Blood pressure remains elevated.  She is hesitant to make any changes to her antihypertensive regimen today as she believes that her pain following knee replacement surgery and anxiety are probably driving her blood pressure.  She will continue  ambulatory blood pressure monitoring and return in 3 to 4 months for follow-up. -Last LDL was at goal, followed by lipid clinic and Teresa Martinez.  Time spent:31 minutes reviewing chart, interviewing and examining patient and formulating plan of care.     Chaya Jan, MD [facility]

## 2022-10-30 ENCOUNTER — Ambulatory Visit (INDEPENDENT_AMBULATORY_CARE_PROVIDER_SITE_OTHER): Payer: Medicare PPO | Admitting: Physical Therapy

## 2022-10-30 ENCOUNTER — Encounter: Payer: Self-pay | Admitting: Physical Therapy

## 2022-10-30 DIAGNOSIS — R262 Difficulty in walking, not elsewhere classified: Secondary | ICD-10-CM | POA: Diagnosis not present

## 2022-10-30 DIAGNOSIS — M6281 Muscle weakness (generalized): Secondary | ICD-10-CM | POA: Diagnosis not present

## 2022-10-30 DIAGNOSIS — R600 Localized edema: Secondary | ICD-10-CM

## 2022-10-30 DIAGNOSIS — M25562 Pain in left knee: Secondary | ICD-10-CM

## 2022-10-30 DIAGNOSIS — G8929 Other chronic pain: Secondary | ICD-10-CM | POA: Diagnosis not present

## 2022-10-30 NOTE — Therapy (Signed)
OUTPATIENT PHYSICAL THERAPY TREATMENT    Patient Name: Teresa Martinez MRN: [id number] DOB:[birth year], 81 y.o., female Today's Date: 10/30/2022    END OF SESSION:   PT End of Session - 10/30/22 1359     Visit Number 12    Number of Visits 20    Date for PT Re-Evaluation 12/02/22    Authorization Type [employer] medicare $20 copay    Progress Note Due on Visit 10    PT Start Time 1191    PT Stop Time 1430    PT Time Calculation (min) 45 min    Equipment Utilized During Treatment --    Activity Tolerance Patient tolerated treatment well    Behavior During Therapy WFL for tasks assessed/performed                      Past Medical History:  Diagnosis Date   Cervical spine degeneration    C5/C6   Chronic right shoulder pain    GERD (gastroesophageal reflux disease)    Hemiparesis affecting dominant side as late effect of cerebrovascular accident ([place]) 12/07/2014   Hyperlipemia    Hypertension    Menopausal syndrome    [name]   Overactive bladder    Statin intolerance    Stroke ([place]) 2015   Past Surgical History:  Procedure Laterality Date   ABDOMINAL HYSTERECTOMY     1985   BAND HEMORRHOIDECTOMY     2010   CATARACT EXTRACTION, BILATERAL     CHOLECYSTECTOMY     1950   LUMBAR LAMINECTOMY Bilateral 08/03/2018   Dr. Octavio Manns, L3 laminotomy, L4-L5 laminectomy with neural foraminal decompression.   PAROTIDECTOMY     30 years ago   TOTAL KNEE ARTHROPLASTY Left 09/10/2022   Procedure: LEFT TOTAL KNEE ARTHROPLASTY;  Surgeon: Mcarthur Rossetti, MD;  Location: [facility];  Service: Orthopedics;  Laterality: Left;   Patient Active Problem List   Diagnosis Date Noted   Status post left knee replacement 09/10/2022   IGT (impaired glucose tolerance) 07/27/2021   Preop testing 07/09/2018   Degenerative lumbar spinal stenosis 05/12/2018   Statin myopathy 08/29/2016   Iliotibial band syndrome of right side 04/23/2016   Acromioclavicular  arthrosis 06/30/2015   Left carpal tunnel syndrome 05/15/2015   Adhesive capsulitis of right shoulder 02/06/2015   Dysarthria due to cerebrovascular accident 01/09/2015   Hemiparesis affecting dominant side as late effect of cerebrovascular accident ([place]) 12/07/2014   Spastic neurogenic bladder 10/31/2014   Right rotator cuff tendonitis 10/19/2014   Stenosis of cervical spine region    Chronic right shoulder pain    Cerebral infarction due to thrombosis of left middle cerebral artery ([place])    Left pontine stroke ([place])    TIA (transient ischemic attack) 10/14/2014   Overactive bladder 10/14/2014   H/O: CVA (cerebrovascular accident) 10/14/2014   NECK PAIN, CHRONIC 10/29/2010   Essential hypertension 10/10/2009   INSOMNIA 09/15/2009   Pure hypercholesterolemia 11/28/2008   MENOPAUSAL SYNDROME 11/28/2008    PCP: Betsy Coder MD  REFERRING PROVIDER: Mcarthur Rossetti, MD  REFERRING DIAG: [id number] (ICD-10-CM) - Status post left knee replacement M17.12 (ICD-10-CM) - Unilateral primary osteoarthritis, left knee  THERAPY DIAG:  Chronic pain of left knee  Muscle weakness (generalized)  Difficulty in walking, not elsewhere classified  Localized edema  Rationale for Evaluation and Treatment: Rehabilitation  ONSET DATE: 09/10/2022  SUBJECTIVE:   SUBJECTIVE STATEMENT: Pt indicated soreness in her knee about 3/10  PERTINENT HISTORY: PMH: CVA with Rt hemiparesis,  HTN, neurogenic bladder  PAIN:  NPRS scale: past at worst in last few days 3/10 Pain location: Lt knee  Pain description: achy tightness Aggravating factors: end range pain, ache all the time Relieving factors: avoid pain  PRECAUTIONS: None  WEIGHT BEARING RESTRICTIONS: No  FALLS:  Has patient fallen in last 6 months? 1 fall Do you have a fear of falling?  nope  LIVING ENVIRONMENT: Lives with: primary alone but has children staying at this time.  Lives in: House/apartment Stairs:5 to enter  at front, 2 in back with handrail on Lt  Has following equipment at home:FWW, SPC  OCCUPATION: Retired  PLOF: Independent, hobbies - read, exercise routine  PATIENT GOALS: Walking without cane or walker, strengthening.    OBJECTIVE:   PATIENT SURVEYS:  10/23/2022:  FOTO update:  33  09/23/2022 FOTO intake: 33   predicted:  52  COGNITION: 09/23/2022 Overall cognitive status: WFL    EDEMA:  09/23/2022 Localized edema in Lt knee noted visually.   PALPATION: 09/23/2022 Mild tenderness joint line, posterior knee joint.   LOWER EXTREMITY MMT:   MMT Right 09/23/2022 Left 09/23/2022 Right 10/23/2022 Left 10/23/2022  Hip flexion 5/5 4/5    Hip extension      Hip abduction      Hip adduction      Hip internal rotation      Hip external rotation      Knee flexion 5/5 4/5  5/5  Knee extension 5/5 2/5 5/5 38, 39.6 lbs 5/5 35, 37 lbs  Ankle dorsiflexion 5/5 5/5    Ankle plantarflexion      Ankle inversion      Ankle eversion       (Blank rows = not tested)  LOWER EXTREMITY ROM:  ROM Right 09/23/2022 Left 09/23/2022 Left 10/01/2022 Left 10/09/2022 Left 10/23/2022  Hip flexion       Hip extension       Hip abduction       Hip adduction       Hip internal rotation       Hip external rotation       Knee flexion  80 AROM in supine heel slide 91 AROM in supine heel slide 105 AROM in supine heel slide 114 AROM in supine heel slide  Knee extension  -10 AROM in seated LAQ.  -5 in supine quad set AROM  -8 AROM in quad set in supine -6 AROM in seated LAQ  Ankle dorsiflexion       Ankle plantarflexion       Ankle inversion       Ankle eversion        (Blank rows = not tested)   FUNCTIONAL TESTS:    09/23/2022 18 inch chair transfer: unable without UE assist Lt SLS: unable unassisted   GAIT: 10/23/2022:  Mod independent c SPC.  Times of Rt foot toe drag in swing.  Lt knee Lacking end range in stance  10/16/2022:  Mod independent c SPC within clinic  10/07/2022:  [place]  use in clinic c supervision  09/23/2022 FWW ambulation c reduced stance on Lt leg, maintained knee flexion in stance.   TODAY'S TREATMENT  DATE:10/30/2022 Therex: Sci fit bike seat #7 Lvl 5 10 mins full revolutions Leg press Double leg 81 lbs 2 x 10, Lt Single leg 2 x 15 43 lbs  Knee extension machine Double leg up, Lt leg lowering slowly 2 x 10 15 lbs  Seated hamstring curl machine DL 25# 2X10 Step up 6 inch Lt leg WB with single hand assist x 10  Lateral step up 6 inch c bilat UE assist x 10  Neuo Re-ed Tandem stance 1 min x 1 bilateral Balance on foam pad feet together 30 sec Balance on foam pad feet together and head turns X 30 seconds and head nods X 30 sec  Manual:   Supine manual hamstring stretching, Lt knee PROM flexion and extension with overpressure, knee extension mobs grade 3  Vaso 10 mins Lt knee in elevation medium compression 34 degrees c heel prop stretch during first 5 mins  TODAY'S TREATMENT                                                                           DATE:10/28/2022 Therex: Sci fit bike seat #7 Lvl 5 10 mins full revolutions Leg press Double leg 81 lbs 2 x 10, Lt Single leg 2 x 15 43 lbs  Knee extension machine Double leg up, Lt leg lowering slowly 2 x 10 15 lbs  Seated hamstring curl machine DL 25# 2X10 Step up 6 inch Lt leg WB with single hand assist x 10  Lateral step up 6 inch c bilat UE assist x 10  Neuo Re-ed Tandem stance 1 min x 1 bilateral Balance on foam pad feet together 1 min Balance on foam pad feet almost together and head turns X 10 bilat  Manual:   Supine manual hamstring stretching, Lt knee PROM flexion and extension with overpressure, knee extension mobs grade 3  Vaso 10 mins Lt knee in elevation medium compression 34 degrees c heel prop stretch during first 5 mins   PATIENT EDUCATION:  09/23/2022 Education details: HEP, POC Person educated:  Patient Education method: Consulting civil engineer, Media planner, Verbal cues, and Handouts Education comprehension: verbalized understanding, returned demonstration, and verbal cues required  HOME EXERCISE PROGRAM: Access Code: [email or web address] URL: [email or web address] Date: 09/23/2022 Prepared by: Scot Jun  Exercises - Seated Long Arc Quad (Mirrored)  - 3-5 x daily - 7 x weekly - 1-2 sets - 10 reps - 2 hold - Supine Heel Slide with Strap  - 3-5 x daily - 7 x weekly - 1-2 sets - 10 reps - 5 hold - Seated Quad Set (Mirrored)  - 3-5 x daily - 7 x weekly - 1 sets - 10 reps - 5 hold - Supine Knee Extension Mobilization with Weight (Mirrored)  - 3-5 x daily - 7 x weekly - 1 sets - 1 reps - to tolerance up to 15 mins hold  ASSESSMENT:  CLINICAL IMPRESSION: She now ambulates in clinic without AD, still uses cane in community. We continued to work to improve her balance, knee strength and knee ROM as tolerated.     OBJECTIVE IMPAIRMENTS: Abnormal gait, decreased activity tolerance, decreased balance, decreased endurance, decreased mobility, difficulty walking, decreased ROM, decreased strength, hypomobility, increased edema, impaired flexibility, improper body mechanics, and pain.  ACTIVITY LIMITATIONS: carrying, lifting, bending, sitting, standing, squatting, sleeping, stairs, transfers, bed mobility, bathing, dressing, and locomotion level  PARTICIPATION LIMITATIONS: meal prep, cleaning, laundry, interpersonal relationship, driving, shopping, and community activity  PERSONAL FACTORS:  CVA with Rt hemiparesis, HTN, neurogenic bladder  are also affecting patient's functional outcome.   REHAB POTENTIAL: Good  CLINICAL DECISION MAKING: Stable/uncomplicated  EVALUATION COMPLEXITY: Low   GOALS: Goals reviewed with patient? Yes  SHORT TERM GOALS: (target date for Short term goals are 3 weeks 10/14/2022)   1.  Patient will demonstrate independent use of home exercise program to  maintain progress from in clinic treatments.  Goal status: Met 10/07/2022  LONG TERM GOALS: (target dates for all long term goals are 10 weeks  12/02/2022 )   1. Patient will demonstrate/report pain at worst less than or equal to 2/10 to facilitate minimal limitation in daily activity secondary to pain symptoms.  Goal status: on going 10/23/2022   2. Patient will demonstrate independent use of home exercise program to facilitate ability to maintain/progress functional gains from skilled physical therapy services.  Goal status: on going 10/23/2022   3. Patient will demonstrate FOTO outcome > or = 52 % to indicate reduced disability due to condition.  Goal status: on going 10/23/2022   4.  Patient will demonstrate Lt  LE MMT 5/5 throughout to faciltiate usual transfers, stairs, squatting at [facility] for daily life.   Goal status: Met strength 5/5 10/23/2022   5.  Patient will demonstrate Lt knee AROM 0-110 deg to facilitate usual transfers, ambulation, and stairs at PLOF s limitation.  Goal status: on going 10/23/2022   6.  Patient will demonstrate independent ambulation community distances > 300 ft to facilitate community ambulation.  Goal status: on going 10/23/2022   7.  Patient will demonstrate ascending/descending stairs c reciprocal gait pattern for household navigation.  Goal Status: on going 10/23/2022   PLAN:  PT FREQUENCY: 2x/week  PT DURATION: 10 weeks  PLANNED INTERVENTIONS: Therapeutic exercises, Therapeutic activity, Neuro Muscular re-education, Balance training, Gait training, Patient/Family education, Joint mobilization, Stair training, DME instructions, Dry Needling, Electrical stimulation, Traction, Cryotherapy, vasopneumatic deviceMoist heat, Taping, Ultrasound, Ionotophoresis 19m/ml Dexamethasone, and Manual therapy.  All included unless contraindicated  PLAN FOR NEXT SESSION: She will need humana reauth next visit.

## 2022-11-05 ENCOUNTER — Encounter: Payer: Self-pay | Admitting: Rehabilitative and Restorative Service Providers"

## 2022-11-05 ENCOUNTER — Ambulatory Visit (INDEPENDENT_AMBULATORY_CARE_PROVIDER_SITE_OTHER): Payer: Medicare PPO | Admitting: Rehabilitative and Restorative Service Providers"

## 2022-11-05 DIAGNOSIS — R600 Localized edema: Secondary | ICD-10-CM

## 2022-11-05 DIAGNOSIS — M25562 Pain in left knee: Secondary | ICD-10-CM | POA: Diagnosis not present

## 2022-11-05 DIAGNOSIS — R262 Difficulty in walking, not elsewhere classified: Secondary | ICD-10-CM

## 2022-11-05 DIAGNOSIS — G8929 Other chronic pain: Secondary | ICD-10-CM | POA: Diagnosis not present

## 2022-11-05 DIAGNOSIS — M6281 Muscle weakness (generalized): Secondary | ICD-10-CM | POA: Diagnosis not present

## 2022-11-05 NOTE — Therapy (Signed)
OUTPATIENT PHYSICAL THERAPY TREATMENT  Mcarthur Rossetti RECERT/PROGRESS NOTE   Patient Name: Teresa Martinez MRN: [id number] DOB:[birth year], 81 y.o., female Today's Date: 11/05/2022  Progress Note Reporting Period 10/23/2022 to 11/05/2022  See note below for Objective Data and Assessment of Progress/Goals.    END OF SESSION:   PT End of Session - 11/05/22 1534     Visit Number 13    Number of Visits 24    Date for PT Re-Evaluation 12/31/22    Authorization Type [employer] medicare $20 copay    Progress Note Due on Visit 10    PT Start Time 1509    PT Stop Time 1559    PT Time Calculation (min) 50 min    Activity Tolerance Patient tolerated treatment well    Behavior During Therapy WFL for tasks assessed/performed               Past Medical History:  Diagnosis Date   Cervical spine degeneration    C5/C6   Chronic right shoulder pain    GERD (gastroesophageal reflux disease)    Hemiparesis affecting dominant side as late effect of cerebrovascular accident ([place]) 12/07/2014   Hyperlipemia    Hypertension    Menopausal syndrome    [name]   Overactive bladder    Statin intolerance    Stroke [facility]) 2015   Past Surgical History:  Procedure Laterality Date   ABDOMINAL HYSTERECTOMY     1985   BAND HEMORRHOIDECTOMY     2010   CATARACT EXTRACTION, BILATERAL     CHOLECYSTECTOMY     1950   LUMBAR LAMINECTOMY Bilateral 08/03/2018   Dr. Octavio Manns, L3 laminotomy, L4-L5 laminectomy with neural foraminal decompression.   PAROTIDECTOMY     30 years ago   TOTAL KNEE ARTHROPLASTY Left 09/10/2022   Procedure: LEFT TOTAL KNEE ARTHROPLASTY;  Surgeon: Mcarthur Rossetti, MD;  Location: [facility];  Service: Orthopedics;  Laterality: Left;   Patient Active Problem List   Diagnosis Date Noted   Status post left knee replacement 09/10/2022   IGT (impaired glucose tolerance) 07/27/2021   Preop testing 07/09/2018   Degenerative lumbar spinal stenosis 05/12/2018   Statin  myopathy 08/29/2016   Iliotibial band syndrome of right side 04/23/2016   Acromioclavicular arthrosis 06/30/2015   Left carpal tunnel syndrome 05/15/2015   Adhesive capsulitis of right shoulder 02/06/2015   Dysarthria due to cerebrovascular accident 01/09/2015   Hemiparesis affecting dominant side as late effect of cerebrovascular accident ([place]) 12/07/2014   Spastic neurogenic bladder 10/31/2014   Right rotator cuff tendonitis 10/19/2014   Stenosis of cervical spine region    Chronic right shoulder pain    Cerebral infarction due to thrombosis of left middle cerebral artery ([place])    Left pontine stroke ([place])    TIA (transient ischemic attack) 10/14/2014   Overactive bladder 10/14/2014   H/O: CVA (cerebrovascular accident) 10/14/2014   NECK PAIN, CHRONIC 10/29/2010   Essential hypertension 10/10/2009   INSOMNIA 09/15/2009   Pure hypercholesterolemia 11/28/2008   MENOPAUSAL SYNDROME 11/28/2008    PCP: Betsy Coder MD  REFERRING PROVIDER: Mcarthur Rossetti, MD  REFERRING DIAG: [phone] (ICD-10-CM) - Status post left knee replacement M17.12 (ICD-10-CM) - Unilateral primary osteoarthritis, left knee  THERAPY DIAG:  Chronic pain of left knee  Muscle weakness (generalized)  Difficulty in walking, not elsewhere classified  Localized edema  Rationale for Evaluation and Treatment: Rehabilitation  ONSET DATE: 09/10/2022  SUBJECTIVE:   SUBJECTIVE STATEMENT: Pt indicated soreness in her knee about 3/10  PERTINENT HISTORY: PMH: CVA with Rt hemiparesis, HTN, neurogenic bladder  PAIN:  NPRS scale: past at worst in last few days 3/10 Pain location: Lt knee  Pain description: achy tightness Aggravating factors: end range pain, ache all the time Relieving factors: avoid pain  PRECAUTIONS: None  WEIGHT BEARING RESTRICTIONS: No  FALLS:  Has patient fallen in last 6 months? 1 fall Do you have a fear of falling?  nope  LIVING ENVIRONMENT: Lives with:  primary alone but has children staying at this time.  Lives in: House/apartment Stairs:5 to enter at front, 2 in back with handrail on Lt  Has following equipment at home:FWW, SPC  OCCUPATION: Retired  PLOF: Independent, hobbies - read, exercise routine  PATIENT GOALS: Walking without cane or walker, strengthening.    OBJECTIVE:   PATIENT SURVEYS:  10/23/2022:  FOTO update:  33  09/23/2022 FOTO intake: 33   predicted:  52  COGNITION: 09/23/2022 Overall cognitive status: WFL    EDEMA:  09/23/2022 Localized edema in Lt knee noted visually.   PALPATION: 09/23/2022 Mild tenderness joint line, posterior knee joint.   LOWER EXTREMITY MMT:   MMT Right 09/23/2022 Left 09/23/2022 Right 10/23/2022 Left 10/23/2022 Left 11/05/2022  Hip flexion 5/5 4/5     Hip extension       Hip abduction       Hip adduction       Hip internal rotation       Hip external rotation       Knee flexion 5/5 4/5  5/5   Knee extension 5/5 2/5 5/5 38, 39.6 lbs 5/5 35, 37 lbs 5/5 40, 43 lbs  Ankle dorsiflexion 5/5 5/5     Ankle plantarflexion       Ankle inversion       Ankle eversion        (Blank rows = not tested)  LOWER EXTREMITY ROM:  ROM Right 09/23/2022 Left 09/23/2022 Left 10/01/2022 Left 10/09/2022 Left 10/23/2022 Left 11/05/2022  Hip flexion        Hip extension        Hip abduction        Hip adduction        Hip internal rotation        Hip external rotation        Knee flexion  80 AROM in supine heel slide 91 AROM in supine heel slide 105 AROM in supine heel slide 114 AROM in supine heel slide 118 AROM in supine heel slide  Knee extension  -10 AROM in seated LAQ.  -5 in supine quad set AROM  -8 AROM in quad set in supine -6 AROM in seated LAQ -4 AROM in seated LAQ  Ankle dorsiflexion        Ankle plantarflexion        Ankle inversion        Ankle eversion         (Blank rows = not tested)   FUNCTIONAL TESTS:    09/23/2022 18 inch chair transfer: unable without UE  assist Lt SLS: unable unassisted   GAIT: 10/23/2022:  Mod independent c SPC.  Times of Rt foot toe drag in swing.  Lt knee Lacking end range in stance  10/16/2022:  Mod independent c SPC within clinic  10/07/2022:  [place] use in clinic c supervision  09/23/2022 FWW ambulation c reduced stance on Lt leg, maintained knee flexion in stance.   TODAY'S TREATMENT  DATE:11/05/2022 Therex: UBW LE only Lvl 5 10 mins full revolutions Leg press Double leg 81 lbs x15, Lt Single leg 2 x 15 43 lbs  Knee extension machine Double leg up, Lt leg lowering slowly 2 x 10 15 lbs  Seated LAQ x 10 prior to ROM measurement Review and performance of supine heel prop TKE stretching passively 3 mins c Rt leg on top of Lt leg.   Neuo Re-ed Tandem stance 1 min x 1 bilateral on foam in // bars occasional HHA on bar  Vaso 10 mins Lt knee in elevation medium compression 34 degrees c heel prop stretch during first 5 mins  TODAY'S TREATMENT                                                                                       DATE:10/30/2022 Therex: Sci fit bike seat #7 Lvl 5 10 mins full revolutions Leg press Double leg 81 lbs 2 x 10, Lt Single leg 2 x 15 43 lbs  Knee extension machine Double leg up, Lt leg lowering slowly 2 x 10 15 lbs  Seated hamstring curl machine DL 25# 2X10 Step up 6 inch Lt leg WB with single hand assist x 10  Lateral step up 6 inch c bilat UE assist x 10   Neuo Re-ed Tandem stance 1 min x 1 bilateral Balance on foam pad feet together 30 sec Balance on foam pad feet together and head turns X 30 seconds and head nods X 30 sec   Manual:   Supine manual hamstring stretching, Lt knee PROM flexion and extension with overpressure, knee extension mobs grade 3   Vaso 10 mins Lt knee in elevation medium compression 34 degrees c heel prop stretch during first 5 mins   PATIENT EDUCATION:  09/23/2022 Education details:  HEP, POC Person educated: Patient Education method: Consulting civil engineer, Media planner, Verbal cues, and Handouts Education comprehension: verbalized understanding, returned demonstration, and verbal cues required  HOME EXERCISE PROGRAM: Access Code: [email or web address] URL: [email or web address] Date: 09/23/2022 Prepared by: Scot Jun  Exercises - Seated Long Arc Quad (Mirrored)  - 3-5 x daily - 7 x weekly - 1-2 sets - 10 reps - 2 hold - Supine Heel Slide with Strap  - 3-5 x daily - 7 x weekly - 1-2 sets - 10 reps - 5 hold - Seated Quad Set (Mirrored)  - 3-5 x daily - 7 x weekly - 1 sets - 10 reps - 5 hold - Supine Knee Extension Mobilization with Weight (Mirrored)  - 3-5 x daily - 7 x weekly - 1 sets - 1 reps - to tolerance up to 15 mins hold  ASSESSMENT:  CLINICAL IMPRESSION: Pt has attended 13 visits overall.  See objective data for updated information.  Pt has continued to show improvements in all areas to this point.  Pt does still have Lt knee pain c end range mobility, balance deficits that impair daily functional activity.  Continued skilled PT services indicated at this time.      OBJECTIVE IMPAIRMENTS: Abnormal gait, decreased activity tolerance, decreased balance, decreased endurance, decreased mobility, difficulty walking, decreased ROM, decreased strength, hypomobility, increased edema, impaired flexibility, improper body  mechanics, and pain.   ACTIVITY LIMITATIONS: carrying, lifting, bending, sitting, standing, squatting, sleeping, stairs, transfers, bed mobility, bathing, dressing, and locomotion level  PARTICIPATION LIMITATIONS: meal prep, cleaning, laundry, interpersonal relationship, driving, shopping, and community activity  PERSONAL FACTORS:  CVA with Rt hemiparesis, HTN, neurogenic bladder  are also affecting patient's functional outcome.   REHAB POTENTIAL: Good  CLINICAL DECISION MAKING: Stable/uncomplicated  EVALUATION COMPLEXITY: Low   GOALS: Goals  reviewed with patient? Yes  SHORT TERM GOALS: (target date for Short term goals are 3 weeks 10/14/2022)   1.  Patient will demonstrate independent use of home exercise program to maintain progress from in clinic treatments.  Goal status: Met 10/07/2022  LONG TERM GOALS: (target dates for all long term goals are 10 weeks  12/31/22 )   1. Patient will demonstrate/report pain at worst less than or equal to 2/10 to facilitate minimal limitation in daily activity secondary to pain symptoms.  Goal status: on going 11/05/2022   2. Patient will demonstrate independent use of home exercise program to facilitate ability to maintain/progress functional gains from skilled physical therapy services.  Goal status: on going 11/05/2022   3. Patient will demonstrate FOTO outcome > or = 52 % to indicate reduced disability due to condition.  Goal status: on going 11/05/2022   4.  Patient will demonstrate Lt  LE MMT 5/5 throughout to faciltiate usual transfers, stairs, squatting at [facility] for daily life.   Goal status: Met strength 5/5 10/23/2022   5.  Patient will demonstrate Lt knee AROM 0-110 deg to facilitate usual transfers, ambulation, and stairs at PLOF s limitation.  Goal status: on going 11/05/2022   6.  Patient will demonstrate independent ambulation community distances > 300 ft to facilitate community ambulation.  Goal status: on going 11/05/2022   7.  Patient will demonstrate ascending/descending stairs c reciprocal gait pattern for household navigation.  Goal Status: on going 11/05/2022   PLAN:  PT FREQUENCY: 1-2x/week  PT DURATION: 8 weeks  PLANNED INTERVENTIONS: Therapeutic exercises, Therapeutic activity, Neuro Muscular re-education, Balance training, Gait training, Patient/Family education, Joint mobilization, Stair training, DME instructions, Dry Needling, Electrical stimulation, Traction, Cryotherapy, vasopneumatic deviceMoist heat, Taping, Ultrasound, Ionotophoresis 65m/ml  Dexamethasone, and Manual therapy.  All included unless contraindicated  PLAN FOR NEXT SESSION: Continued strengthening, end range extension gains. Compliant surface balance.    Referring diagnosis? Z96.652 (ICD-10-CM) - Status post left knee replacement M17.12 (ICD-10-CM) - Unilateral primary osteoarthritis, left knee Treatment diagnosis? (if different than referring diagnosis) M25.562 What was this (referring dx) caused by? _0  Surgery _1  Fall _2  Ongoing issue _3  Arthritis _4  Other: ____________   Laterality: _5  Rt _6  Lt _7  Both   Check all possible CPT codes:                   *CHOOSE 10 OR LESS*                                _8  97110 (Therapeutic Exercise)                   _9  92507 (SLP Treatment)      _10  924097(Neuro Re-ed)                                _11  935329(Swallowing Treatment)                  _12   97116 (Gait Training)                                _0  [phone] (Cognitive Training, 1st 15 minutes) _1  97140 (Manual Therapy)                           _2  97130 (Cognitive Training, each add'l 15 minutes)         _3  97164 (Re-evaluation)                              _4  Other, List CPT Code ____________  _5  09323 (Therapeutic Activities)                                            _6  55732 (Self Care)               _7  All codes above (97110 - 97535)             _8  20254 (Mechanical Traction)             _9  97014 (E-stim Unattended)             _10  97032 (E-stim manual)             _11  97033 (Ionto)             _12  97035 (Ultrasound) _13  97750 (Physical Performance Training) _14  [id number] (Aquatic Therapy) _15  97016 (Vasopneumatic Device) _16  27062 (Paraffin) _17  97034 (Contrast Bath) _18  97597 (Wound Care 1st 20 sq cm) _19  97598 (Wound Care each add'l 20 sq cm) _20  97760 (Orthotic Fabrication, Fitting, Training Initial) _21  [id number] (Prosthetic Management and Training Initial) _22  [id number] (Orthotic or Prosthetic Training/ Modification Subsequent)

## 2022-11-06 ENCOUNTER — Ambulatory Visit (INDEPENDENT_AMBULATORY_CARE_PROVIDER_SITE_OTHER): Payer: Medicare PPO | Admitting: Rehabilitative and Restorative Service Providers"

## 2022-11-06 ENCOUNTER — Encounter: Payer: Self-pay | Admitting: Rehabilitative and Restorative Service Providers"

## 2022-11-06 DIAGNOSIS — G8929 Other chronic pain: Secondary | ICD-10-CM | POA: Diagnosis not present

## 2022-11-06 DIAGNOSIS — R262 Difficulty in walking, not elsewhere classified: Secondary | ICD-10-CM | POA: Diagnosis not present

## 2022-11-06 DIAGNOSIS — R600 Localized edema: Secondary | ICD-10-CM | POA: Diagnosis not present

## 2022-11-06 DIAGNOSIS — M6281 Muscle weakness (generalized): Secondary | ICD-10-CM | POA: Diagnosis not present

## 2022-11-06 DIAGNOSIS — M25562 Pain in left knee: Secondary | ICD-10-CM | POA: Diagnosis not present

## 2022-11-06 NOTE — Therapy (Signed)
OUTPATIENT PHYSICAL THERAPY TREATMENT    Patient Name: Teresa Martinez MRN: [id number] DOB:[birth year], 81 y.o., female Today's Date: 11/06/2022    END OF SESSION:   PT End of Session - 11/06/22 1432     Visit Number 14    Number of Visits 24    Date for PT Re-Evaluation 12/31/22    Authorization Type [employer] medicare $20 copay    Authorization Time Period -12/31/2021    Authorization - Visit Number 2    Authorization - Number of Visits 12    PT Start Time 1426    PT Stop Time 1515    PT Time Calculation (min) 49 min    Activity Tolerance Patient tolerated treatment well    Behavior During Therapy WFL for tasks assessed/performed                Past Medical History:  Diagnosis Date   Cervical spine degeneration    C5/C6   Chronic right shoulder pain    GERD (gastroesophageal reflux disease)    Hemiparesis affecting dominant side as late effect of cerebrovascular accident ([place]) 12/07/2014   Hyperlipemia    Hypertension    Menopausal syndrome    [name]   Overactive bladder    Statin intolerance    Stroke ([place]) 2015   Past Surgical History:  Procedure Laterality Date   ABDOMINAL HYSTERECTOMY     1985   BAND HEMORRHOIDECTOMY     2010   CATARACT EXTRACTION, BILATERAL     CHOLECYSTECTOMY     1950   LUMBAR LAMINECTOMY Bilateral 08/03/2018   Dr. Octavio Manns, L3 laminotomy, L4-L5 laminectomy with neural foraminal decompression.   PAROTIDECTOMY     30 years ago   TOTAL KNEE ARTHROPLASTY Left 09/10/2022   Procedure: LEFT TOTAL KNEE ARTHROPLASTY;  Surgeon: Mcarthur Rossetti, MD;  Location: [facility];  Service: Orthopedics;  Laterality: Left;   Patient Active Problem List   Diagnosis Date Noted   Status post left knee replacement 09/10/2022   IGT (impaired glucose tolerance) 07/27/2021   Preop testing 07/09/2018   Degenerative lumbar spinal stenosis 05/12/2018   Statin myopathy 08/29/2016   Iliotibial band syndrome of right side 04/23/2016    Acromioclavicular arthrosis 06/30/2015   Left carpal tunnel syndrome 05/15/2015   Adhesive capsulitis of right shoulder 02/06/2015   Dysarthria due to cerebrovascular accident 01/09/2015   Hemiparesis affecting dominant side as late effect of cerebrovascular accident ([place]) 12/07/2014   Spastic neurogenic bladder 10/31/2014   Right rotator cuff tendonitis 10/19/2014   Stenosis of cervical spine region    Chronic right shoulder pain    Cerebral infarction due to thrombosis of left middle cerebral artery ([place])    Left pontine stroke ([place])    TIA (transient ischemic attack) 10/14/2014   Overactive bladder 10/14/2014   H/O: CVA (cerebrovascular accident) 10/14/2014   NECK PAIN, CHRONIC 10/29/2010   Essential hypertension 10/10/2009   INSOMNIA 09/15/2009   Pure hypercholesterolemia 11/28/2008   MENOPAUSAL SYNDROME 11/28/2008    PCP: Betsy Coder MD  REFERRING PROVIDER: Mcarthur Rossetti, MD  REFERRING DIAG: [phone] (ICD-10-CM) - Status post left knee replacement M17.12 (ICD-10-CM) - Unilateral primary osteoarthritis, left knee  THERAPY DIAG:  Chronic pain of left knee  Muscle weakness (generalized)  Difficulty in walking, not elsewhere classified  Localized edema  Rationale for Evaluation and Treatment: Rehabilitation  ONSET DATE: 09/10/2022  SUBJECTIVE:   SUBJECTIVE STATEMENT: Sore in lateral knee with movement.  She indicated nothing major, just noted.  PERTINENT HISTORY: PMH: CVA with Rt hemiparesis, HTN, neurogenic bladder  PAIN:  NPRS scale:  0/10 pain at rest.  Sore in lateral knee with movement Pain location: Lt knee  Pain description: achy tightness Aggravating factors: end range pain, ache all the time Relieving factors: avoid pain  PRECAUTIONS: None  WEIGHT BEARING RESTRICTIONS: No  FALLS:  Has patient fallen in last 6 months? 1 fall Do you have a fear of falling?  nope  LIVING ENVIRONMENT: Lives with: primary alone but has  children staying at this time.  Lives in: House/apartment Stairs:5 to enter at front, 2 in back with handrail on Lt  Has following equipment at home:FWW, SPC  OCCUPATION: Retired  PLOF: Independent, hobbies - read, exercise routine  PATIENT GOALS: Walking without cane or walker, strengthening.    OBJECTIVE:   PATIENT SURVEYS:  11/06/2022  : FOTO update:  51  10/23/2022:  FOTO update:  33  09/23/2022 FOTO intake: 33   predicted:  52  COGNITION: 09/23/2022 Overall cognitive status: WFL    EDEMA:  09/23/2022 Localized edema in Lt knee noted visually.   PALPATION: 09/23/2022 Mild tenderness joint line, posterior knee joint.   LOWER EXTREMITY MMT:   MMT Right 09/23/2022 Left 09/23/2022 Right 10/23/2022 Left 10/23/2022 Left 11/05/2022  Hip flexion 5/5 4/5     Hip extension       Hip abduction       Hip adduction       Hip internal rotation       Hip external rotation       Knee flexion 5/5 4/5  5/5   Knee extension 5/5 2/5 5/5 38, 39.6 lbs 5/5 35, 37 lbs 5/5 40, 43 lbs  Ankle dorsiflexion 5/5 5/5     Ankle plantarflexion       Ankle inversion       Ankle eversion        (Blank rows = not tested)  LOWER EXTREMITY ROM:  ROM Right 09/23/2022 Left 09/23/2022 Left 10/01/2022 Left 10/09/2022 Left 10/23/2022 Left 11/05/2022  Hip flexion        Hip extension        Hip abduction        Hip adduction        Hip internal rotation        Hip external rotation        Knee flexion  80 AROM in supine heel slide 91 AROM in supine heel slide 105 AROM in supine heel slide 114 AROM in supine heel slide 118 AROM in supine heel slide  Knee extension  -10 AROM in seated LAQ.  -5 in supine quad set AROM  -8 AROM in quad set in supine -6 AROM in seated LAQ -4 AROM in seated LAQ  Ankle dorsiflexion        Ankle plantarflexion        Ankle inversion        Ankle eversion         (Blank rows = not tested)   FUNCTIONAL TESTS:    09/23/2022 18 inch chair transfer: unable  without UE assist Lt SLS: unable unassisted   GAIT: 10/23/2022:  Mod independent c SPC.  Times of Rt foot toe drag in swing.  Lt knee Lacking end range in stance  10/16/2022:  Mod independent c SPC within clinic  10/07/2022:  Hooven use in clinic c supervision  09/23/2022 FWW ambulation c reduced stance on Lt leg, maintained knee flexion in stance.   TODAY'S  TREATMENT                                                                                 DATE:11/06/2022 Therex: UBW LE only Lvl 3 8 mins full revolutions Leg press Double leg 81 lbs x15, Lt Single leg 2 x 15 43 lbs  Knee extension machine Double leg up, Lt leg lowering slowly 2 x 15 15 lbs  Supine LAQ with Lt hip in 90 deg flexion x 15 Supine hamstring stretch c strap Lt 15 sec x 3  TherActivity Step on over and down WB on Lt leg c single handrail assist 4 inch step x 16  Neuo Re-ed Pre gait step over 4 inch hurdle clear with fwd/retro rocker on stance leg x 15 bilateral c CGA  Vaso 10 mins Lt knee in elevation medium compression 34 degrees c heel prop stretch during first 5 mins  TODAY'S TREATMENT                                                                                 DATE:11/05/2022 Therex: UBW LE only Lvl 5 10 mins full revolutions Leg press Double leg 81 lbs x15, Lt Single leg 2 x 15 43 lbs  Knee extension machine Double leg up, Lt leg lowering slowly 2 x 10 15 lbs  Seated LAQ x 10 prior to ROM measurement Review and performance of supine heel prop TKE stretching passively 3 mins c Rt leg on top of Lt leg.   Neuo Re-ed Tandem stance 1 min x 1 bilateral on foam in // bars occasional HHA on bar  Vaso 10 mins Lt knee in elevation medium compression 34 degrees c heel prop stretch during first 5 mins  TODAY'S TREATMENT                                                                                       DATE:10/30/2022 Therex: Sci fit bike seat #7 Lvl 5 10 mins full revolutions Leg press Double leg 81 lbs 2 x  10, Lt Single leg 2 x 15 43 lbs  Knee extension machine Double leg up, Lt leg lowering slowly 2 x 10 15 lbs  Seated hamstring curl machine DL 25# 2X10 Step up 6 inch Lt leg WB with single hand assist x 10  Lateral step up 6 inch c bilat UE assist x 10   Neuo Re-ed Tandem stance 1 min x 1 bilateral Balance on foam pad feet together 30 sec Balance on foam pad feet together and head turns X 30  seconds and head nods X 30 sec   Manual:   Supine manual hamstring stretching, Lt knee PROM flexion and extension with overpressure, knee extension mobs grade 3   Vaso 10 mins Lt knee in elevation medium compression 34 degrees c heel prop stretch during first 5 mins   PATIENT EDUCATION:  09/23/2022 Education details: HEP, POC Person educated: Patient Education method: Consulting civil engineer, Media planner, Verbal cues, and Handouts Education comprehension: verbalized understanding, returned demonstration, and verbal cues required  HOME EXERCISE PROGRAM: Access Code: [email or web address] URL: [email or web address] Date: 09/23/2022 Prepared by: Scot Jun  Exercises - Seated Long Arc Quad (Mirrored)  - 3-5 x daily - 7 x weekly - 1-2 sets - 10 reps - 2 hold - Supine Heel Slide with Strap  - 3-5 x daily - 7 x weekly - 1-2 sets - 10 reps - 5 hold - Seated Quad Set (Mirrored)  - 3-5 x daily - 7 x weekly - 1 sets - 10 reps - 5 hold - Supine Knee Extension Mobilization with Weight (Mirrored)  - 3-5 x daily - 7 x weekly - 1 sets - 1 reps - to tolerance up to 15 mins hold  ASSESSMENT:  CLINICAL IMPRESSION: Pt arrived with some tightness symptoms but improved during movement in clinic.  WB stair lowering on Lt leg still lacking but getting better.    OBJECTIVE IMPAIRMENTS: Abnormal gait, decreased activity tolerance, decreased balance, decreased endurance, decreased mobility, difficulty walking, decreased ROM, decreased strength, hypomobility, increased edema, impaired flexibility, improper body  mechanics, and pain.   ACTIVITY LIMITATIONS: carrying, lifting, bending, sitting, standing, squatting, sleeping, stairs, transfers, bed mobility, bathing, dressing, and locomotion level  PARTICIPATION LIMITATIONS: meal prep, cleaning, laundry, interpersonal relationship, driving, shopping, and community activity  PERSONAL FACTORS:  CVA with Rt hemiparesis, HTN, neurogenic bladder  are also affecting patient's functional outcome.   REHAB POTENTIAL: Good  CLINICAL DECISION MAKING: Stable/uncomplicated  EVALUATION COMPLEXITY: Low   GOALS: Goals reviewed with patient? Yes  SHORT TERM GOALS: (target date for Short term goals are 3 weeks 10/14/2022)   1.  Patient will demonstrate independent use of home exercise program to maintain progress from in clinic treatments.  Goal status: Met 10/07/2022  LONG TERM GOALS: (target dates for all long term goals are 10 weeks  12/31/22 )   1. Patient will demonstrate/report pain at worst less than or equal to 2/10 to facilitate minimal limitation in daily activity secondary to pain symptoms.  Goal status: on going 11/05/2022   2. Patient will demonstrate independent use of home exercise program to facilitate ability to maintain/progress functional gains from skilled physical therapy services.  Goal status: on going 11/05/2022   3. Patient will demonstrate FOTO outcome > or = 52 % to indicate reduced disability due to condition.  Goal status: on going 11/05/2022   4.  Patient will demonstrate Lt  LE MMT 5/5 throughout to faciltiate usual transfers, stairs, squatting at [facility] for daily life.   Goal status: Met strength 5/5 10/23/2022   5.  Patient will demonstrate Lt knee AROM 0-110 deg to facilitate usual transfers, ambulation, and stairs at PLOF s limitation.  Goal status: on going 11/05/2022   6.  Patient will demonstrate independent ambulation community distances > 300 ft to facilitate community ambulation.  Goal status: on going  11/05/2022   7.  Patient will demonstrate ascending/descending stairs c reciprocal gait pattern for household navigation.  Goal Status: on going 11/05/2022   PLAN:  PT FREQUENCY: 1-2x/week  PT DURATION: 8 weeks  PLANNED INTERVENTIONS: Therapeutic exercises, Therapeutic activity, Neuro Muscular re-education, Balance training, Gait training, Patient/Family education, Joint mobilization, Stair training, DME instructions, Dry Needling, Electrical stimulation, Traction, Cryotherapy, vasopneumatic deviceMoist heat, Taping, Ultrasound, Ionotophoresis 94m/ml Dexamethasone, and Manual therapy.  All included unless contraindicated  PLAN FOR NEXT SESSION: Progressive strengthening, dynamic balance improvements.    MScot Jun PT, DPT, OCS, ATC 11/06/22  3:06 PM

## 2022-11-13 ENCOUNTER — Encounter: Payer: Self-pay | Admitting: Rehabilitative and Restorative Service Providers"

## 2022-11-13 ENCOUNTER — Ambulatory Visit (INDEPENDENT_AMBULATORY_CARE_PROVIDER_SITE_OTHER): Payer: Medicare PPO | Admitting: Rehabilitative and Restorative Service Providers"

## 2022-11-13 DIAGNOSIS — M25562 Pain in left knee: Secondary | ICD-10-CM

## 2022-11-13 DIAGNOSIS — R600 Localized edema: Secondary | ICD-10-CM

## 2022-11-13 DIAGNOSIS — G8929 Other chronic pain: Secondary | ICD-10-CM

## 2022-11-13 DIAGNOSIS — R262 Difficulty in walking, not elsewhere classified: Secondary | ICD-10-CM | POA: Diagnosis not present

## 2022-11-13 DIAGNOSIS — M6281 Muscle weakness (generalized): Secondary | ICD-10-CM

## 2022-11-13 NOTE — Therapy (Signed)
OUTPATIENT PHYSICAL THERAPY TREATMENT    Patient Name: Teresa Martinez MRN: [id number] DOB:10-03-1941, 81 y.o., female Today's Date: 11/13/2022    END OF SESSION:   PT End of Session - 11/13/22 1429     Visit Number 15    Number of Visits 24    Date for PT Re-Evaluation 12/31/22    Authorization Type [employer] medicare $20 copay    Authorization Time Period -12/31/2021    Authorization - Visit Number 3    Authorization - Number of Visits 12    PT Start Time 6237    PT Stop Time 1512    PT Time Calculation (min) 49 min    Activity Tolerance Patient tolerated treatment well    Behavior During Therapy WFL for tasks assessed/performed                 Past Medical History:  Diagnosis Date   Cervical spine degeneration    C5/C6   Chronic right shoulder pain    GERD (gastroesophageal reflux disease)    Hemiparesis affecting dominant side as late effect of cerebrovascular accident ([place]) 12/07/2014   Hyperlipemia    Hypertension    Menopausal syndrome    [name]   Overactive bladder    Statin intolerance    Stroke ([place]) 2015   Past Surgical History:  Procedure Laterality Date   ABDOMINAL HYSTERECTOMY     1985   BAND HEMORRHOIDECTOMY     2010   CATARACT EXTRACTION, BILATERAL     CHOLECYSTECTOMY     1950   LUMBAR LAMINECTOMY Bilateral 08/03/2018   Dr. Octavio Manns, L3 laminotomy, L4-L5 laminectomy with neural foraminal decompression.   PAROTIDECTOMY     30 years ago   TOTAL KNEE ARTHROPLASTY Left 09/10/2022   Procedure: LEFT TOTAL KNEE ARTHROPLASTY;  Surgeon: Mcarthur Rossetti, MD;  Location: [facility];  Service: Orthopedics;  Laterality: Left;   Patient Active Problem List   Diagnosis Date Noted   Status post left knee replacement 09/10/2022   IGT (impaired glucose tolerance) 07/27/2021   Preop testing 07/09/2018   Degenerative lumbar spinal stenosis 05/12/2018   Statin myopathy 08/29/2016   Iliotibial band syndrome of right side  04/23/2016   Acromioclavicular arthrosis 06/30/2015   Left carpal tunnel syndrome 05/15/2015   Adhesive capsulitis of right shoulder 02/06/2015   Dysarthria due to cerebrovascular accident 01/09/2015   Hemiparesis affecting dominant side as late effect of cerebrovascular accident ([place]) 12/07/2014   Spastic neurogenic bladder 10/31/2014   Right rotator cuff tendonitis 10/19/2014   Stenosis of cervical spine region    Chronic right shoulder pain    Cerebral infarction due to thrombosis of left middle cerebral artery ([place])    Left pontine stroke ([place])    TIA (transient ischemic attack) 10/14/2014   Overactive bladder 10/14/2014   H/O: CVA (cerebrovascular accident) 10/14/2014   NECK PAIN, CHRONIC 10/29/2010   Essential hypertension 10/10/2009   INSOMNIA 09/15/2009   Pure hypercholesterolemia 11/28/2008   MENOPAUSAL SYNDROME 11/28/2008    PCP: Betsy Coder MD  REFERRING PROVIDER: Mcarthur Rossetti, MD  REFERRING DIAG: [phone] (ICD-10-CM) - Status post left knee replacement M17.12 (ICD-10-CM) - Unilateral primary osteoarthritis, left knee  THERAPY DIAG:  Chronic pain of left knee  Muscle weakness (generalized)  Difficulty in walking, not elsewhere classified  Localized edema  Rationale for Evaluation and Treatment: Rehabilitation  ONSET DATE: 09/10/2022  SUBJECTIVE:   SUBJECTIVE STATEMENT: See indicated some twinges of symptoms here and there around the knee.  Reported  having some more swelling yesterday but not as much today.   PERTINENT HISTORY: PMH: CVA with Rt hemiparesis, HTN, neurogenic bladder  PAIN:  NPRS scale:  0/10 pain at rest.  Sore in lateral knee with movement Pain location: Lt knee  Pain description: achy tightness Aggravating factors: end range pain, ache all the time Relieving factors: avoid pain  PRECAUTIONS: None  WEIGHT BEARING RESTRICTIONS: No  FALLS:  Has patient fallen in last 6 months? 1 fall Do you have a fear of  falling?  nope  LIVING ENVIRONMENT: Lives with: primary alone but has children staying at this time.  Lives in: House/apartment Stairs:5 to enter at front, 2 in back with handrail on Lt  Has following equipment at home:FWW, SPC  OCCUPATION: Retired  PLOF: Independent, hobbies - read, exercise routine  PATIENT GOALS: Walking without cane or walker, strengthening.    OBJECTIVE:   PATIENT SURVEYS:  11/06/2022  : FOTO update:  51  10/23/2022:  FOTO update:  33  09/23/2022 FOTO intake: 33   predicted:  52  COGNITION: 09/23/2022 Overall cognitive status: WFL    EDEMA:  09/23/2022 Localized edema in Lt knee noted visually.   PALPATION: 09/23/2022 Mild tenderness joint line, posterior knee joint.   LOWER EXTREMITY MMT:   MMT Right 09/23/2022 Left 09/23/2022 Right 10/23/2022 Left 10/23/2022 Left 11/05/2022  Hip flexion 5/5 4/5     Hip extension       Hip abduction       Hip adduction       Hip internal rotation       Hip external rotation       Knee flexion 5/5 4/5  5/5   Knee extension 5/5 2/5 5/5 38, 39.6 lbs 5/5 35, 37 lbs 5/5 40, 43 lbs  Ankle dorsiflexion 5/5 5/5     Ankle plantarflexion       Ankle inversion       Ankle eversion        (Blank rows = not tested)  LOWER EXTREMITY ROM:  ROM Right 09/23/2022 Left 09/23/2022 Left 10/01/2022 Left 10/09/2022 Left 10/23/2022 Left 11/05/2022  Hip flexion        Hip extension        Hip abduction        Hip adduction        Hip internal rotation        Hip external rotation        Knee flexion  80 AROM in supine heel slide 91 AROM in supine heel slide 105 AROM in supine heel slide 114 AROM in supine heel slide 118 AROM in supine heel slide  Knee extension  -10 AROM in seated LAQ.  -5 in supine quad set AROM  -8 AROM in quad set in supine -6 AROM in seated LAQ -4 AROM in seated LAQ  Ankle dorsiflexion        Ankle plantarflexion        Ankle inversion        Ankle eversion         (Blank rows = not  tested)   FUNCTIONAL TESTS:    09/23/2022 18 inch chair transfer: unable without UE assist Lt SLS: unable unassisted   GAIT: 11/13/2022:  Able to perform independent ambulation in clinic with Rt leg limitation (foot drop) noted more than any Lt leg.  SPC use into and out of clinic.   10/23/2022:  Mod independent c SPC.  Times of Rt foot toe drag in swing.  Lt knee Lacking end range in stance  10/16/2022:  Mod independent c SPC within clinic  10/07/2022:  [place] use in clinic c supervision  09/23/2022 FWW ambulation c reduced stance on Lt leg, maintained knee flexion in stance.   TODAY'S TREATMENT                                                                                 DATE:11/13/2022 Therex: UBE LE only Lvl 3 8 mins full revolutions Leg press Double leg 87 lbs x15, Lt Single leg 2 x 10 50 lbs  Knee extension machine Double leg up, Lt leg lowering slowly 2 x 15 15 lbs  Seated SLR 2 x 10 slow movement focus  Incline board bilateral gastroc 30 sec x 3  TherActivity Flight of stairs up/down reciprocal gait with single hand rail assist and SBA x 2 (approx. 15 stairs x 2 each way)  Manual: Seated Lt knee flexion c distraction/IR mobilization c movement.   Vaso 10 mins Lt knee in elevation medium compression 34 degrees c heel prop stretch during first 5 mins  TODAY'S TREATMENT                                                                                 DATE:11/06/2022 Therex: UBE LE only Lvl 3 8 mins full revolutions Leg press Double leg 81 lbs x15, Lt Single leg 2 x 15 43 lbs  Knee extension machine Double leg up, Lt leg lowering slowly 2 x 15 15 lbs  Supine LAQ with Lt hip in 90 deg flexion x 15 Supine hamstring stretch c strap Lt 15 sec x 3  TherActivity Step on over and down WB on Lt leg c single handrail assist 4 inch step x 16  Neuo Re-ed Pre gait step over 4 inch hurdle clear with fwd/retro rocker on stance leg x 15 bilateral c CGA  Vaso 10 mins Lt knee in  elevation medium compression 34 degrees c heel prop stretch during first 5 mins  TODAY'S TREATMENT                                                                                 DATE:11/05/2022 Therex: UBW LE only Lvl 5 10 mins full revolutions Leg press Double leg 81 lbs x15, Lt Single leg 2 x 15 43 lbs  Knee extension machine Double leg up, Lt leg lowering slowly 2 x 10 15 lbs  Seated LAQ x 10 prior to ROM measurement Review and performance of supine heel prop TKE stretching passively 3 mins c Rt leg on  top of Lt leg.   Neuo Re-ed Tandem stance 1 min x 1 bilateral on foam in // bars occasional HHA on bar  Vaso 10 mins Lt knee in elevation medium compression 34 degrees c heel prop stretch during first 5 mins   PATIENT EDUCATION:  09/23/2022 Education details: HEP, POC Person educated: Patient Education method: Consulting civil engineer, Media planner, Verbal cues, and Handouts Education comprehension: verbalized understanding, returned demonstration, and verbal cues required  HOME EXERCISE PROGRAM: Access Code: [email or web address] URL: [email or web address] Date: 09/23/2022 Prepared by: Scot Jun  Exercises - Seated Long Arc Quad (Mirrored)  - 3-5 x daily - 7 x weekly - 1-2 sets - 10 reps - 2 hold - Supine Heel Slide with Strap  - 3-5 x daily - 7 x weekly - 1-2 sets - 10 reps - 5 hold - Seated Quad Set (Mirrored)  - 3-5 x daily - 7 x weekly - 1 sets - 10 reps - 5 hold - Supine Knee Extension Mobilization with Weight (Mirrored)  - 3-5 x daily - 7 x weekly - 1 sets - 1 reps - to tolerance up to 15 mins hold  ASSESSMENT:  CLINICAL IMPRESSION: Stair navigation , specifically lowering with weight on Lt leg still limited and painful compared to normal.  Continued improvement to help improve progressive navigation in community.   OBJECTIVE IMPAIRMENTS: Abnormal gait, decreased activity tolerance, decreased balance, decreased endurance, decreased mobility, difficulty walking, decreased  ROM, decreased strength, hypomobility, increased edema, impaired flexibility, improper body mechanics, and pain.   ACTIVITY LIMITATIONS: carrying, lifting, bending, sitting, standing, squatting, sleeping, stairs, transfers, bed mobility, bathing, dressing, and locomotion level  PARTICIPATION LIMITATIONS: meal prep, cleaning, laundry, interpersonal relationship, driving, shopping, and community activity  PERSONAL FACTORS:  CVA with Rt hemiparesis, HTN, neurogenic bladder  are also affecting patient's functional outcome.   REHAB POTENTIAL: Good  CLINICAL DECISION MAKING: Stable/uncomplicated  EVALUATION COMPLEXITY: Low   GOALS: Goals reviewed with patient? Yes  SHORT TERM GOALS: (target date for Short term goals are 3 weeks 10/14/2022)   1.  Patient will demonstrate independent use of home exercise program to maintain progress from in clinic treatments.  Goal status: Met 10/07/2022  LONG TERM GOALS: (target dates for all long term goals are 10 weeks  12/31/22 )   1. Patient will demonstrate/report pain at worst less than or equal to 2/10 to facilitate minimal limitation in daily activity secondary to pain symptoms.  Goal status: on going 11/05/2022   2. Patient will demonstrate independent use of home exercise program to facilitate ability to maintain/progress functional gains from skilled physical therapy services.  Goal status: on going 11/05/2022   3. Patient will demonstrate FOTO outcome > or = 52 % to indicate reduced disability due to condition.  Goal status: on going 11/05/2022   4.  Patient will demonstrate Lt  LE MMT 5/5 throughout to faciltiate usual transfers, stairs, squatting at [facility] for daily life.   Goal status: Met strength 5/5 10/23/2022   5.  Patient will demonstrate Lt knee AROM 0-110 deg to facilitate usual transfers, ambulation, and stairs at PLOF s limitation.  Goal status: on going 11/05/2022   6.  Patient will demonstrate independent ambulation  community distances > 300 ft to facilitate community ambulation.  Goal status: on going 11/05/2022   7.  Patient will demonstrate ascending/descending stairs c reciprocal gait pattern for household navigation.  Goal Status: on going 11/05/2022   PLAN:  PT FREQUENCY: 1-2x/week  PT DURATION: 8 weeks  PLANNED INTERVENTIONS: Therapeutic  exercises, Therapeutic activity, Neuro Muscular re-education, Balance training, Gait training, Patient/Family education, Joint mobilization, Stair training, DME instructions, Dry Needling, Electrical stimulation, Traction, Cryotherapy, vasopneumatic deviceMoist heat, Taping, Ultrasound, Ionotophoresis 63m/ml Dexamethasone, and Manual therapy.  All included unless contraindicated  PLAN FOR NEXT SESSION: Progressive strengthening, dynamic balance improvements.    MScot Jun PT, DPT, OCS, ATC 11/13/22  3:03 PM

## 2022-11-14 ENCOUNTER — Encounter: Payer: Self-pay | Admitting: Rehabilitative and Restorative Service Providers"

## 2022-11-14 ENCOUNTER — Ambulatory Visit (INDEPENDENT_AMBULATORY_CARE_PROVIDER_SITE_OTHER): Payer: Medicare PPO | Admitting: Rehabilitative and Restorative Service Providers"

## 2022-11-14 DIAGNOSIS — M6281 Muscle weakness (generalized): Secondary | ICD-10-CM

## 2022-11-14 DIAGNOSIS — M25562 Pain in left knee: Secondary | ICD-10-CM | POA: Diagnosis not present

## 2022-11-14 DIAGNOSIS — G8929 Other chronic pain: Secondary | ICD-10-CM

## 2022-11-14 DIAGNOSIS — R600 Localized edema: Secondary | ICD-10-CM

## 2022-11-14 DIAGNOSIS — R262 Difficulty in walking, not elsewhere classified: Secondary | ICD-10-CM | POA: Diagnosis not present

## 2022-11-14 NOTE — Therapy (Signed)
OUTPATIENT PHYSICAL THERAPY TREATMENT    Patient Name: Teresa Martinez MRN: [id number] DOB:10-Oct-1941, 81 y.o., female Today's Date: 11/14/2022    END OF SESSION:   PT End of Session - 11/14/22 1516     Visit Number 16    Number of Visits 24    Date for PT Re-Evaluation 12/31/22    Authorization Type [employer] medicare $20 copay    Authorization Time Period -12/31/2021    Authorization - Visit Number 4    Authorization - Number of Visits 12    PT Start Time 1503    PT Stop Time 1552    PT Time Calculation (min) 49 min    Activity Tolerance Patient tolerated treatment well    Behavior During Therapy WFL for tasks assessed/performed                  Past Medical History:  Diagnosis Date   Cervical spine degeneration    C5/C6   Chronic right shoulder pain    GERD (gastroesophageal reflux disease)    Hemiparesis affecting dominant side as late effect of cerebrovascular accident ([place]) 12/07/2014   Hyperlipemia    Hypertension    Menopausal syndrome    [name]   Overactive bladder    Statin intolerance    Stroke ([place]) 2015   Past Surgical History:  Procedure Laterality Date   ABDOMINAL HYSTERECTOMY     1985   BAND HEMORRHOIDECTOMY     2010   CATARACT EXTRACTION, BILATERAL     CHOLECYSTECTOMY     1950   LUMBAR LAMINECTOMY Bilateral 08/03/2018   Dr. Octavio Manns, L3 laminotomy, L4-L5 laminectomy with neural foraminal decompression.   PAROTIDECTOMY     30 years ago   TOTAL KNEE ARTHROPLASTY Left 09/10/2022   Procedure: LEFT TOTAL KNEE ARTHROPLASTY;  Surgeon: Mcarthur Rossetti, MD;  Location: [facility];  Service: Orthopedics;  Laterality: Left;   Patient Active Problem List   Diagnosis Date Noted   Status post left knee replacement 09/10/2022   IGT (impaired glucose tolerance) 07/27/2021   Preop testing 07/09/2018   Degenerative lumbar spinal stenosis 05/12/2018   Statin myopathy 08/29/2016   Iliotibial band syndrome of right side  04/23/2016   Acromioclavicular arthrosis 06/30/2015   Left carpal tunnel syndrome 05/15/2015   Adhesive capsulitis of right shoulder 02/06/2015   Dysarthria due to cerebrovascular accident 01/09/2015   Hemiparesis affecting dominant side as late effect of cerebrovascular accident ([place]) 12/07/2014   Spastic neurogenic bladder 10/31/2014   Right rotator cuff tendonitis 10/19/2014   Stenosis of cervical spine region    Chronic right shoulder pain    Cerebral infarction due to thrombosis of left middle cerebral artery ([place])    Left pontine stroke ([place])    TIA (transient ischemic attack) 10/14/2014   Overactive bladder 10/14/2014   H/O: CVA (cerebrovascular accident) 10/14/2014   NECK PAIN, CHRONIC 10/29/2010   Essential hypertension 10/10/2009   INSOMNIA 09/15/2009   Pure hypercholesterolemia 11/28/2008   MENOPAUSAL SYNDROME 11/28/2008    PCP: Betsy Coder MD  REFERRING PROVIDER: Mcarthur Rossetti, MD  REFERRING DIAG: [phone] (ICD-10-CM) - Status post left knee replacement M17.12 (ICD-10-CM) - Unilateral primary osteoarthritis, left knee  THERAPY DIAG:  Chronic pain of left knee  Muscle weakness (generalized)  Difficulty in walking, not elsewhere classified  Localized edema  Rationale for Evaluation and Treatment: Rehabilitation  ONSET DATE: 09/10/2022  SUBJECTIVE:   SUBJECTIVE STATEMENT: No change in symptoms from yesterday to today.   PERTINENT HISTORY: PMH:  CVA with Rt hemiparesis, HTN, neurogenic bladder  PAIN:  NPRS scale:  0/10 pain at rest.  Sore in lateral knee with movement Pain location: Lt knee  Pain description: achy tightness Aggravating factors: end range pain, ache all the time Relieving factors: avoid pain  PRECAUTIONS: None  WEIGHT BEARING RESTRICTIONS: No  FALLS:  Has patient fallen in last 6 months? 1 fall Do you have a fear of falling?  nope  LIVING ENVIRONMENT: Lives with: primary alone but has children staying at  this time.  Lives in: House/apartment Stairs:5 to enter at front, 2 in back with handrail on Lt  Has following equipment at home:FWW, SPC  OCCUPATION: Retired  PLOF: Independent, hobbies - read, exercise routine  PATIENT GOALS: Walking without cane or walker, strengthening.    OBJECTIVE:   PATIENT SURVEYS:  11/06/2022  : FOTO update:  51  10/23/2022:  FOTO update:  33  09/23/2022 FOTO intake: 33   predicted:  52  COGNITION: 09/23/2022 Overall cognitive status: WFL    EDEMA:  09/23/2022 Localized edema in Lt knee noted visually.   PALPATION: 09/23/2022 Mild tenderness joint line, posterior knee joint.   LOWER EXTREMITY MMT:   MMT Right 09/23/2022 Left 09/23/2022 Right 10/23/2022 Left 10/23/2022 Left 11/05/2022  Hip flexion 5/5 4/5     Hip extension       Hip abduction       Hip adduction       Hip internal rotation       Hip external rotation       Knee flexion 5/5 4/5  5/5   Knee extension 5/5 2/5 5/5 38, 39.6 lbs 5/5 35, 37 lbs 5/5 40, 43 lbs  Ankle dorsiflexion 5/5 5/5     Ankle plantarflexion       Ankle inversion       Ankle eversion        (Blank rows = not tested)  LOWER EXTREMITY ROM:  ROM Right 09/23/2022 Left 09/23/2022 Left 10/01/2022 Left 10/09/2022 Left 10/23/2022 Left 11/05/2022  Hip flexion        Hip extension        Hip abduction        Hip adduction        Hip internal rotation        Hip external rotation        Knee flexion  80 AROM in supine heel slide 91 AROM in supine heel slide 105 AROM in supine heel slide 114 AROM in supine heel slide 118 AROM in supine heel slide  Knee extension  -10 AROM in seated LAQ.  -5 in supine quad set AROM  -8 AROM in quad set in supine -6 AROM in seated LAQ -4 AROM in seated LAQ  Ankle dorsiflexion        Ankle plantarflexion        Ankle inversion        Ankle eversion         (Blank rows = not tested)   FUNCTIONAL TESTS:  11/14/2022:  5 x sit to stand 18 inch chair 18.23 seconds no UE assist.    09/23/2022 18 inch chair transfer: unable without UE assist Lt SLS: unable unassisted   GAIT: 11/13/2022:  Able to perform independent ambulation in clinic with Rt leg limitation (foot drop) noted more than any Lt leg.  SPC use into and out of clinic.   10/23/2022:  Mod independent c SPC.  Times of Rt foot toe drag in swing.  Lt knee Lacking end range in stance  10/16/2022:  Mod independent c SPC within clinic  10/07/2022:  Bettles use in clinic c supervision  09/23/2022 FWW ambulation c reduced stance on Lt leg, maintained knee flexion in stance.   TODAY'S TREATMENT                                                                                 DATE:11/14/2022 Therex: UBE LE only Lvl 3 8 mins full revolutions Leg press Double leg 87 lbs x15, Lt Single leg x 12 , x 15  50 lbs  Knee extension machine Double leg up, Lt leg lowering slowly 2 x 15 15 lbs  Fwd step up WB on Lt leg c TKE blue band 6 inch 2 x 10 c single hand assist on bar Incline board bilateral gastroc 30 sec x 3  Neuro Re-ed Fwd/back fitter board rocker light touching focus c SBA to occasional min A 25x  Front foot elevated 4 inch step tandem stance 1 min x 1 bilateral c occasional hand assist on bar, SBA  Vaso 10 mins Lt knee in elevation medium compression 34 degrees   TODAY'S TREATMENT                                                                                 DATE:11/13/2022 Therex: UBE LE only Lvl 3 8 mins full revolutions Leg press Double leg 87 lbs x15, Lt Single leg 2 x 10 50 lbs  Knee extension machine Double leg up, Lt leg lowering slowly 2 x 15 15 lbs  Seated SLR 2 x 10 slow movement focus  Incline board bilateral gastroc 30 sec x 3  TherActivity Flight of stairs up/down reciprocal gait with single hand rail assist and SBA x 2 (approx. 15 stairs x 2 each way)  Manual: Seated Lt knee flexion c distraction/IR mobilization c movement.   Vaso 10 mins Lt knee in elevation medium compression 34  degrees c heel prop stretch during first 5 mins  TODAY'S TREATMENT                                                                                 DATE:11/06/2022 Therex: UBE LE only Lvl 3 8 mins full revolutions Leg press Double leg 81 lbs x15, Lt Single leg 2 x 15 43 lbs  Knee extension machine Double leg up, Lt leg lowering slowly 2 x 15 15 lbs  Supine LAQ with Lt hip in 90 deg flexion x 15 Supine hamstring stretch c strap Lt 15 sec x 3  TherActivity  Step on over and down WB on Lt leg c single handrail assist 4 inch step x 16  Neuo Re-ed Pre gait step over 4 inch hurdle clear with fwd/retro rocker on stance leg x 15 bilateral c CGA  Vaso 10 mins Lt knee in elevation medium compression 34 degrees c heel prop stretch during first 5 mins  PATIENT EDUCATION:  09/23/2022 Education details: HEP, POC Person educated: Patient Education method: Consulting civil engineer, Media planner, Verbal cues, and Handouts Education comprehension: verbalized understanding, returned demonstration, and verbal cues required  HOME EXERCISE PROGRAM: Access Code: [email or web address] URL: [email or web address] Date: 09/23/2022 Prepared by: Scot Jun  Exercises - Seated Long Arc Quad (Mirrored)  - 3-5 x daily - 7 x weekly - 1-2 sets - 10 reps - 2 hold - Supine Heel Slide with Strap  - 3-5 x daily - 7 x weekly - 1-2 sets - 10 reps - 5 hold - Seated Quad Set (Mirrored)  - 3-5 x daily - 7 x weekly - 1 sets - 10 reps - 5 hold - Supine Knee Extension Mobilization with Weight (Mirrored)  - 3-5 x daily - 7 x weekly - 1 sets - 1 reps - to tolerance up to 15 mins hold  ASSESSMENT:  CLINICAL IMPRESSION: Pt to continue to benefit from static and dynamic balance improvements to help ambulation stability.  Extension end range active and passive can continue to improve.   OBJECTIVE IMPAIRMENTS: Abnormal gait, decreased activity tolerance, decreased balance, decreased endurance, decreased mobility, difficulty walking,  decreased ROM, decreased strength, hypomobility, increased edema, impaired flexibility, improper body mechanics, and pain.   ACTIVITY LIMITATIONS: carrying, lifting, bending, sitting, standing, squatting, sleeping, stairs, transfers, bed mobility, bathing, dressing, and locomotion level  PARTICIPATION LIMITATIONS: meal prep, cleaning, laundry, interpersonal relationship, driving, shopping, and community activity  PERSONAL FACTORS:  CVA with Rt hemiparesis, HTN, neurogenic bladder  are also affecting patient's functional outcome.   REHAB POTENTIAL: Good  CLINICAL DECISION MAKING: Stable/uncomplicated  EVALUATION COMPLEXITY: Low   GOALS: Goals reviewed with patient? Yes  SHORT TERM GOALS: (target date for Short term goals are 3 weeks 10/14/2022)   1.  Patient will demonstrate independent use of home exercise program to maintain progress from in clinic treatments.  Goal status: Met 10/07/2022  LONG TERM GOALS: (target dates for all long term goals are 10 weeks  12/31/22 )   1. Patient will demonstrate/report pain at worst less than or equal to 2/10 to facilitate minimal limitation in daily activity secondary to pain symptoms.  Goal status: on going 11/05/2022   2. Patient will demonstrate independent use of home exercise program to facilitate ability to maintain/progress functional gains from skilled physical therapy services.  Goal status: on going 11/05/2022   3. Patient will demonstrate FOTO outcome > or = 52 % to indicate reduced disability due to condition.  Goal status: on going 11/05/2022   4.  Patient will demonstrate Lt  LE MMT 5/5 throughout to faciltiate usual transfers, stairs, squatting at [facility] for daily life.   Goal status: Met strength 5/5 10/23/2022   5.  Patient will demonstrate Lt knee AROM 0-110 deg to facilitate usual transfers, ambulation, and stairs at PLOF s limitation.  Goal status: on going 11/05/2022   6.  Patient will demonstrate independent  ambulation community distances > 300 ft to facilitate community ambulation.  Goal status: on going 11/05/2022   7.  Patient will demonstrate ascending/descending stairs c reciprocal gait pattern for household navigation.  Goal Status: on going 11/05/2022  PLAN:  PT FREQUENCY: 1-2x/week  PT DURATION: 8 weeks  PLANNED INTERVENTIONS: Therapeutic exercises, Therapeutic activity, Neuro Muscular re-education, Balance training, Gait training, Patient/Family education, Joint mobilization, Stair training, DME instructions, Dry Needling, Electrical stimulation, Traction, Cryotherapy, vasopneumatic deviceMoist heat, Taping, Ultrasound, Ionotophoresis 12m/ml Dexamethasone, and Manual therapy.  All included unless contraindicated  PLAN FOR NEXT SESSION: Progressive strengthening, static and compliant balance.    MScot Jun PT, DPT, OCS, ATC 11/14/22  3:44 PM

## 2022-11-19 ENCOUNTER — Encounter: Payer: Self-pay | Admitting: Rehabilitative and Restorative Service Providers"

## 2022-11-19 ENCOUNTER — Ambulatory Visit (INDEPENDENT_AMBULATORY_CARE_PROVIDER_SITE_OTHER): Payer: Medicare PPO | Admitting: Rehabilitative and Restorative Service Providers"

## 2022-11-19 DIAGNOSIS — G8929 Other chronic pain: Secondary | ICD-10-CM

## 2022-11-19 DIAGNOSIS — R262 Difficulty in walking, not elsewhere classified: Secondary | ICD-10-CM | POA: Diagnosis not present

## 2022-11-19 DIAGNOSIS — M6281 Muscle weakness (generalized): Secondary | ICD-10-CM | POA: Diagnosis not present

## 2022-11-19 DIAGNOSIS — R600 Localized edema: Secondary | ICD-10-CM | POA: Diagnosis not present

## 2022-11-19 DIAGNOSIS — M25562 Pain in left knee: Secondary | ICD-10-CM

## 2022-11-19 NOTE — Therapy (Signed)
OUTPATIENT PHYSICAL THERAPY TREATMENT    Patient Name: Teresa Martinez MRN: [id number] DOB:[birth year], 82 y.o., female Today's Date: 11/19/2022    END OF SESSION:   PT End of Session - 11/19/22 1413     Visit Number 17    Number of Visits 24    Date for PT Re-Evaluation 12/31/22    Authorization Type [employer] medicare $20 copay    Authorization Time Period -12/31/2021    Authorization - Visit Number 5    Authorization - Number of Visits 12    PT Start Time 7989    PT Stop Time 2119    PT Time Calculation (min) 39 min    Activity Tolerance Patient tolerated treatment well    Behavior During Therapy WFL for tasks assessed/performed                   Past Medical History:  Diagnosis Date   Cervical spine degeneration    C5/C6   Chronic right shoulder pain    GERD (gastroesophageal reflux disease)    Hemiparesis affecting dominant side as late effect of cerebrovascular accident ([place]) 12/07/2014   Hyperlipemia    Hypertension    Menopausal syndrome    [name]   Overactive bladder    Statin intolerance    Stroke ([place]) 2015   Past Surgical History:  Procedure Laterality Date   ABDOMINAL HYSTERECTOMY     1985   BAND HEMORRHOIDECTOMY     2010   CATARACT EXTRACTION, BILATERAL     CHOLECYSTECTOMY     1950   LUMBAR LAMINECTOMY Bilateral 08/03/2018   Dr. Octavio Manns, L3 laminotomy, L4-L5 laminectomy with neural foraminal decompression.   PAROTIDECTOMY     30 years ago   TOTAL KNEE ARTHROPLASTY Left 09/10/2022   Procedure: LEFT TOTAL KNEE ARTHROPLASTY;  Surgeon: Mcarthur Rossetti, MD;  Location: [facility];  Service: Orthopedics;  Laterality: Left;   Patient Active Problem List   Diagnosis Date Noted   Status post left knee replacement 09/10/2022   IGT (impaired glucose tolerance) 07/27/2021   Preop testing 07/09/2018   Degenerative lumbar spinal stenosis 05/12/2018   Statin myopathy 08/29/2016   Iliotibial band syndrome of right side  04/23/2016   Acromioclavicular arthrosis 06/30/2015   Left carpal tunnel syndrome 05/15/2015   Adhesive capsulitis of right shoulder 02/06/2015   Dysarthria due to cerebrovascular accident 01/09/2015   Hemiparesis affecting dominant side as late effect of cerebrovascular accident ([place]) 12/07/2014   Spastic neurogenic bladder 10/31/2014   Right rotator cuff tendonitis 10/19/2014   Stenosis of cervical spine region    Chronic right shoulder pain    Cerebral infarction due to thrombosis of left middle cerebral artery ([place])    Left pontine stroke ([place])    TIA (transient ischemic attack) 10/14/2014   Overactive bladder 10/14/2014   H/O: CVA (cerebrovascular accident) 10/14/2014   NECK PAIN, CHRONIC 10/29/2010   Essential hypertension 10/10/2009   INSOMNIA 09/15/2009   Pure hypercholesterolemia 11/28/2008   MENOPAUSAL SYNDROME 11/28/2008    PCP: Betsy Coder MD  REFERRING PROVIDER: Mcarthur Rossetti, MD  REFERRING DIAG: [phone] (ICD-10-CM) - Status post left knee replacement M17.12 (ICD-10-CM) - Unilateral primary osteoarthritis, left knee  THERAPY DIAG:  Chronic pain of left knee  Muscle weakness (generalized)  Difficulty in walking, not elsewhere classified  Localized edema  Rationale for Evaluation and Treatment: Rehabilitation  ONSET DATE: 09/10/2022  SUBJECTIVE:   SUBJECTIVE STATEMENT: No change in symptoms from yesterday to today.   PERTINENT HISTORY:  PMH: CVA with Rt hemiparesis, HTN, neurogenic bladder  PAIN:  NPRS scale:  0/10 pain at rest.  Sore in lateral knee with movement Pain location: Lt knee  Pain description: achy tightness Aggravating factors: end range pain, ache all the time Relieving factors: avoid pain  PRECAUTIONS: None  WEIGHT BEARING RESTRICTIONS: No  FALLS:  Has patient fallen in last 6 months? 1 fall Do you have a fear of falling?  nope  LIVING ENVIRONMENT: Lives with: primary alone but has children staying at  this time.  Lives in: House/apartment Stairs:5 to enter at front, 2 in back with handrail on Lt  Has following equipment at home:FWW, SPC  OCCUPATION: Retired  PLOF: Independent, hobbies - read, exercise routine  PATIENT GOALS: Walking without cane or walker, strengthening.    OBJECTIVE:   PATIENT SURVEYS:  11/06/2022  : FOTO update:  51  10/23/2022:  FOTO update:  33  09/23/2022 FOTO intake: 33   predicted:  52  COGNITION: 09/23/2022 Overall cognitive status: WFL    EDEMA:  09/23/2022 Localized edema in Lt knee noted visually.   PALPATION: 09/23/2022 Mild tenderness joint line, posterior knee joint.   LOWER EXTREMITY MMT:   MMT Right 09/23/2022 Left 09/23/2022 Right 10/23/2022 Left 10/23/2022 Left 11/05/2022  Hip flexion 5/5 4/5     Hip extension       Hip abduction       Hip adduction       Hip internal rotation       Hip external rotation       Knee flexion 5/5 4/5  5/5   Knee extension 5/5 2/5 5/5 38, 39.6 lbs 5/5 35, 37 lbs 5/5 40, 43 lbs  Ankle dorsiflexion 5/5 5/5     Ankle plantarflexion       Ankle inversion       Ankle eversion        (Blank rows = not tested)  LOWER EXTREMITY ROM:  ROM Right 09/23/2022 Left 09/23/2022 Left 10/01/2022 Left 10/09/2022 Left 10/23/2022 Left 11/05/2022  Hip flexion        Hip extension        Hip abduction        Hip adduction        Hip internal rotation        Hip external rotation        Knee flexion  80 AROM in supine heel slide 91 AROM in supine heel slide 105 AROM in supine heel slide 114 AROM in supine heel slide 118 AROM in supine heel slide  Knee extension  -10 AROM in seated LAQ.  -5 in supine quad set AROM  -8 AROM in quad set in supine -6 AROM in seated LAQ -4 AROM in seated LAQ  Ankle dorsiflexion        Ankle plantarflexion        Ankle inversion        Ankle eversion         (Blank rows = not tested)   FUNCTIONAL TESTS:  11/14/2022:  5 x sit to stand 18 inch chair 18.23 seconds no UE assist.    09/23/2022 18 inch chair transfer: unable without UE assist Lt SLS: unable unassisted   GAIT: 11/13/2022:  Able to perform independent ambulation in clinic with Rt leg limitation (foot drop) noted more than any Lt leg.  SPC use into and out of clinic.   10/23/2022:  Mod independent c SPC.  Times of Rt foot toe drag in swing.  Lt knee Lacking end range in stance  10/16/2022:  Mod independent c SPC within clinic  10/07/2022:  [place] use in clinic c supervision  09/23/2022 FWW ambulation c reduced stance on Lt leg, maintained knee flexion in stance.   TODAY'S TREATMENT                                                                                 DATE:11/19/2022 Therex: UBE LE only Lvl 3 8 mins full revolutions Leg press Double leg 87 lbs x20 (increase weight next visit), Lt Single leg 2 x 15 56 lbs Knee extension machine Double leg up, Lt leg lowering slowly x 10 20 lbs x15 15 lbs Step on over and down WB on Lt leg 6 inch step x 8 with single hand rail assist  Incline board bilateral gastroc 30 sec x 3  Neuro Re-ed Fwd/back fitter board rocker light touching focus c SBA to occasional min A 25x  Standing on foam 6 inch box toe tapping alternating 2 x 10 bilateral c SBA to occasional min A    TODAY'S TREATMENT                                                                                 DATE:11/14/2022 Therex: UBE LE only Lvl 3 8 mins full revolutions Leg press Double leg 87 lbs x15, Lt Single leg x 12 , x 15  50 lbs  Knee extension machine Double leg up, Lt leg lowering slowly 2 x 15 15 lbs  Fwd step up WB on Lt leg c TKE blue band 6 inch 2 x 10 c single hand assist on bar Incline board bilateral gastroc 30 sec x 3  Neuro Re-ed Fwd/back fitter board rocker light touching focus c SBA to occasional min A 25x  Front foot elevated 4 inch step tandem stance 1 min x 1 bilateral c occasional hand assist on bar, SBA  Vaso 10 mins Lt knee in elevation medium compression 34 degrees    TODAY'S TREATMENT                                                                                 DATE:11/13/2022 Therex: UBE LE only Lvl 3 8 mins full revolutions Leg press Double leg 87 lbs x15, Lt Single leg 2 x 10 50 lbs  Knee extension machine Double leg up, Lt leg lowering slowly 2 x 15 15 lbs  Seated SLR 2 x 10 slow movement focus  Incline board bilateral gastroc 30 sec x 3  TherActivity Flight of stairs up/down reciprocal gait  with single hand rail assist and SBA x 2 (approx. 15 stairs x 2 each way)  Manual: Seated Lt knee flexion c distraction/IR mobilization c movement.   Vaso 10 mins Lt knee in elevation medium compression 34 degrees c heel prop stretch during first 5 mins   PATIENT EDUCATION:  09/23/2022 Education details: HEP, POC Person educated: Patient Education method: Consulting civil engineer, Demonstration, Verbal cues, and Handouts Education comprehension: verbalized understanding, returned demonstration, and verbal cues required  HOME EXERCISE PROGRAM: Access Code: [email or web address] URL: [email or web address] Date: 09/23/2022 Prepared by: Scot Jun  Exercises - Seated Long Arc Quad (Mirrored)  - 3-5 x daily - 7 x weekly - 1-2 sets - 10 reps - 2 hold - Supine Heel Slide with Strap  - 3-5 x daily - 7 x weekly - 1-2 sets - 10 reps - 5 hold - Seated Quad Set (Mirrored)  - 3-5 x daily - 7 x weekly - 1 sets - 10 reps - 5 hold - Supine Knee Extension Mobilization with Weight (Mirrored)  - 3-5 x daily - 7 x weekly - 1 sets - 1 reps - to tolerance up to 15 mins hold  ASSESSMENT:  CLINICAL IMPRESSION: Improvement in quality of control on stair navigation today.  Still reliant on hand assist for support on balance activity but reducing it.  Continued skilled PT services indicated at this time.   OBJECTIVE IMPAIRMENTS: Abnormal gait, decreased activity tolerance, decreased balance, decreased endurance, decreased mobility, difficulty walking, decreased ROM, decreased  strength, hypomobility, increased edema, impaired flexibility, improper body mechanics, and pain.   ACTIVITY LIMITATIONS: carrying, lifting, bending, sitting, standing, squatting, sleeping, stairs, transfers, bed mobility, bathing, dressing, and locomotion level  PARTICIPATION LIMITATIONS: meal prep, cleaning, laundry, interpersonal relationship, driving, shopping, and community activity  PERSONAL FACTORS:  CVA with Rt hemiparesis, HTN, neurogenic bladder  are also affecting patient's functional outcome.   REHAB POTENTIAL: Good  CLINICAL DECISION MAKING: Stable/uncomplicated  EVALUATION COMPLEXITY: Low   GOALS: Goals reviewed with patient? Yes  SHORT TERM GOALS: (target date for Short term goals are 3 weeks 10/14/2022)   1.  Patient will demonstrate independent use of home exercise program to maintain progress from in clinic treatments.  Goal status: Met 10/07/2022  LONG TERM GOALS: (target dates for all long term goals are 10 weeks  12/31/22 )   1. Patient will demonstrate/report pain at worst less than or equal to 2/10 to facilitate minimal limitation in daily activity secondary to pain symptoms.  Goal status: on going 11/05/2022   2. Patient will demonstrate independent use of home exercise program to facilitate ability to maintain/progress functional gains from skilled physical therapy services.  Goal status: on going 11/05/2022   3. Patient will demonstrate FOTO outcome > or = 52 % to indicate reduced disability due to condition.  Goal status: on going 11/05/2022   4.  Patient will demonstrate Lt  LE MMT 5/5 throughout to faciltiate usual transfers, stairs, squatting at [facility] for daily life.   Goal status: Met strength 5/5 10/23/2022   5.  Patient will demonstrate Lt knee AROM 0-110 deg to facilitate usual transfers, ambulation, and stairs at PLOF s limitation.  Goal status: on going 11/05/2022   6.  Patient will demonstrate independent ambulation community distances >  300 ft to facilitate community ambulation.  Goal status: on going 11/05/2022   7.  Patient will demonstrate ascending/descending stairs c reciprocal gait pattern for household navigation.  Goal Status: on going 11/05/2022   PLAN:  PT FREQUENCY: 1-2x/week  PT DURATION: 8 weeks  PLANNED INTERVENTIONS: Therapeutic exercises, Therapeutic activity, Neuro Muscular re-education, Balance training, Gait training, Patient/Family education, Joint mobilization, Stair training, DME instructions, Dry Needling, Electrical stimulation, Traction, Cryotherapy, vasopneumatic deviceMoist heat, Taping, Ultrasound, Ionotophoresis 58m/ml Dexamethasone, and Manual therapy.  All included unless contraindicated  PLAN FOR NEXT SESSION: MD note next visit.    MScot Jun PT, DPT, OCS, ATC 11/19/22  2:21 PM

## 2022-11-21 ENCOUNTER — Encounter: Payer: Self-pay | Admitting: Rehabilitative and Restorative Service Providers"

## 2022-11-21 ENCOUNTER — Ambulatory Visit (INDEPENDENT_AMBULATORY_CARE_PROVIDER_SITE_OTHER): Payer: Medicare PPO | Admitting: Rehabilitative and Restorative Service Providers"

## 2022-11-21 DIAGNOSIS — R600 Localized edema: Secondary | ICD-10-CM

## 2022-11-21 DIAGNOSIS — M25562 Pain in left knee: Secondary | ICD-10-CM

## 2022-11-21 DIAGNOSIS — G8929 Other chronic pain: Secondary | ICD-10-CM | POA: Diagnosis not present

## 2022-11-21 DIAGNOSIS — M6281 Muscle weakness (generalized): Secondary | ICD-10-CM | POA: Diagnosis not present

## 2022-11-21 DIAGNOSIS — R262 Difficulty in walking, not elsewhere classified: Secondary | ICD-10-CM | POA: Diagnosis not present

## 2022-11-21 NOTE — Therapy (Signed)
OUTPATIENT PHYSICAL THERAPY TREATMENT /PROGRESS NOTE   Patient Name: Teresa Martinez MRN: [id number] DOB:1940-12-31, 82 y.o., female Today's Date: 11/21/2022   Progress Note Reporting Period 11/06/2023 to 11/21/2022  See note below for Objective Data and Assessment of Progress/Goals.   END OF SESSION:   PT End of Session - 11/21/22 1352     Visit Number 18    Number of Visits 24    Date for PT Re-Evaluation 12/31/22    Authorization Type [employer] medicare $20 copay    Authorization Time Period -12/31/2021    Authorization - Visit Number 6    Authorization - Number of Visits 12    Progress Note Due on Visit 24    PT Start Time 1346    PT Stop Time 1425    PT Time Calculation (min) 39 min    Activity Tolerance Patient tolerated treatment well    Behavior During Therapy WFL for tasks assessed/performed               Past Medical History:  Diagnosis Date   Cervical spine degeneration    C5/C6   Chronic right shoulder pain    GERD (gastroesophageal reflux disease)    Hemiparesis affecting dominant side as late effect of cerebrovascular accident ([place]) 12/07/2014   Hyperlipemia    Hypertension    Menopausal syndrome    [name]   Overactive bladder    Statin intolerance    Stroke ([place]) 2015   Past Surgical History:  Procedure Laterality Date   ABDOMINAL HYSTERECTOMY     1985   BAND HEMORRHOIDECTOMY     2010   CATARACT EXTRACTION, BILATERAL     CHOLECYSTECTOMY     1950   LUMBAR LAMINECTOMY Bilateral 08/03/2018   Dr. Octavio Manns, L3 laminotomy, L4-L5 laminectomy with neural foraminal decompression.   PAROTIDECTOMY     30 years ago   TOTAL KNEE ARTHROPLASTY Left 09/10/2022   Procedure: LEFT TOTAL KNEE ARTHROPLASTY;  Surgeon: Mcarthur Rossetti, MD;  Location: [facility];  Service: Orthopedics;  Laterality: Left;   Patient Active Problem List   Diagnosis Date Noted   Status post left knee replacement 09/10/2022   IGT (impaired glucose tolerance)  07/27/2021   Preop testing 07/09/2018   Degenerative lumbar spinal stenosis 05/12/2018   Statin myopathy 08/29/2016   Iliotibial band syndrome of right side 04/23/2016   Acromioclavicular arthrosis 06/30/2015   Left carpal tunnel syndrome 05/15/2015   Adhesive capsulitis of right shoulder 02/06/2015   Dysarthria due to cerebrovascular accident 01/09/2015   Hemiparesis affecting dominant side as late effect of cerebrovascular accident ([place]) 12/07/2014   Spastic neurogenic bladder 10/31/2014   Right rotator cuff tendonitis 10/19/2014   Stenosis of cervical spine region    Chronic right shoulder pain    Cerebral infarction due to thrombosis of left middle cerebral artery ([place])    Left pontine stroke ([place])    TIA (transient ischemic attack) 10/14/2014   Overactive bladder 10/14/2014   H/O: CVA (cerebrovascular accident) 10/14/2014   NECK PAIN, CHRONIC 10/29/2010   Essential hypertension 10/10/2009   INSOMNIA 09/15/2009   Pure hypercholesterolemia 11/28/2008   MENOPAUSAL SYNDROME 11/28/2008    PCP: Betsy Coder MD  REFERRING PROVIDER: Mcarthur Rossetti, MD  REFERRING DIAG: [id number] (ICD-10-CM) - Status post left knee replacement M17.12 (ICD-10-CM) - Unilateral primary osteoarthritis, left knee  THERAPY DIAG:  Chronic pain of left knee  Muscle weakness (generalized)  Difficulty in walking, not elsewhere classified  Localized edema  Rationale for Evaluation  and Treatment: Rehabilitation  ONSET DATE: 09/10/2022  SUBJECTIVE:   SUBJECTIVE STATEMENT: She indicated stiffness complaint is still her main complaint.   PERTINENT HISTORY: PMH: CVA with Rt hemiparesis, HTN, neurogenic bladder  PAIN:  NPRS scale:  0/10 pain at rest.  Sore in lateral knee with movement Pain location: Lt knee  Pain description: achy tightness Aggravating factors: end range pain, ache all the time Relieving factors: avoid pain  PRECAUTIONS: None  WEIGHT BEARING RESTRICTIONS:  No  FALLS:  Has patient fallen in last 6 months? 1 fall Do you have a fear of falling?  nope  LIVING ENVIRONMENT: Lives with: primary alone but has children staying at this time.  Lives in: House/apartment Stairs:5 to enter at front, 2 in back with handrail on Lt  Has following equipment at home:FWW, SPC  OCCUPATION: Retired  PLOF: Independent, hobbies - read, exercise routine  PATIENT GOALS: Walking without cane or walker, strengthening.    OBJECTIVE:   PATIENT SURVEYS:  11/21/2022: FOTO update:  52  11/06/2022  : FOTO update:  51  10/23/2022:  FOTO update:  33  09/23/2022 FOTO intake: 33   predicted:  52  COGNITION: 09/23/2022 Overall cognitive status: WFL    EDEMA:  09/23/2022 Localized edema in Lt knee noted visually.   PALPATION: 09/23/2022 Mild tenderness joint line, posterior knee joint.   LOWER EXTREMITY MMT:   MMT Right 09/23/2022 Left 09/23/2022 Right 10/23/2022 Left 10/23/2022 Left 11/05/2022 Right 11/21/2022 Left 11/21/2022  Hip flexion 5/5 4/5       Hip extension         Hip abduction         Hip adduction         Hip internal rotation         Hip external rotation         Knee flexion 5/5 4/5  5/5     Knee extension 5/5 2/5 5/5 38, 39.6 lbs 5/5 35, 37 lbs 5/5 40, 43 lbs 5/5 54.7, 55 lbs 5/5 38, 40 lbs  Ankle dorsiflexion 5/5 5/5       Ankle plantarflexion         Ankle inversion         Ankle eversion          (Blank rows = not tested)  LOWER EXTREMITY ROM:  ROM Left 09/23/2022 Left 10/01/2022 Left 10/09/2022 Left 10/23/2022 Left 11/05/2022 Left 11/21/2022  Hip flexion        Hip extension        Hip abduction        Hip adduction        Hip internal rotation        Hip external rotation        Knee flexion 80 AROM in supine heel slide 91 AROM in supine heel slide 105 AROM in supine heel slide 114 AROM in supine heel slide 118 AROM in supine heel slide 123 AROM in supine heel slide  Knee extension -10 AROM in seated LAQ.  -5 in supine  quad set AROM  -8 AROM in quad set in supine -6 AROM in seated LAQ -4 AROM in seated LAQ -4 AROM in seated LAQ  -1 in supine quad set AROM  Ankle dorsiflexion        Ankle plantarflexion        Ankle inversion        Ankle eversion         (Blank rows = not tested)  FUNCTIONAL TESTS:    11/14/2022:  5 x sit to stand 18 inch chair 18.23 seconds no UE assist.   09/23/2022 18 inch chair transfer: unable without UE assist Lt SLS: unable unassisted   GAIT: 11/13/2022:  Able to perform independent ambulation in clinic with Rt leg limitation (foot drop) noted more than any Lt leg.  SPC use into and out of clinic.   10/23/2022:  Mod independent c SPC.  Times of Rt foot toe drag in swing.  Lt knee Lacking end range in stance  10/16/2022:  Mod independent c SPC within clinic  10/07/2022:  [facility] use in clinic c supervision  09/23/2022 FWW ambulation c reduced stance on Lt leg, maintained knee flexion in stance.   TODAY'S TREATMENT                                                                                 DATE:11/21/2022 Therex: UBE LE only Lvl 3 8 mins full revolutions Knee extension machine Double leg up, Lt leg lowering slowly 20 lbs 3 x 10  Leg press Lt Single leg 2 x 15 62 lbs c very slow lowering focus Incline board bilateral gastroc 30 sec x 3  Neuro Re-ed SLS on black mat c contralateral leg touching corner of mat x 8 WB on Lt leg  TODAY'S TREATMENT                                                                                 DATE:11/19/2022 Therex: UBE LE only Lvl 3 8 mins full revolutions Leg press Double leg 87 lbs x20 (increase weight next visit), Lt Single leg 2 x 15 56 lbs Knee extension machine Double leg up, Lt leg lowering slowly x 10 20 lbs x15 15 lbs Step on over and down WB on Lt leg 6 inch step x 8 with single hand rail assist  Incline board bilateral gastroc 30 sec x 3  Neuro Re-ed Fwd/back fitter board rocker light touching focus c SBA to occasional min A  25x  Standing on foam 6 inch box toe tapping alternating 2 x 10 bilateral c SBA to occasional min A    TODAY'S TREATMENT                                                                                 DATE:11/14/2022 Therex: UBE LE only Lvl 3 8 mins full revolutions Leg press Double leg 87 lbs x15, Lt Single leg x 12 , x 15  50 lbs  Knee extension machine Double leg up, Lt leg lowering slowly  2 x 15 15 lbs  Fwd step up WB on Lt leg c TKE blue band 6 inch 2 x 10 c single hand assist on bar Incline board bilateral gastroc 30 sec x 3  Neuro Re-ed Fwd/back fitter board rocker light touching focus c SBA to occasional min A 25x  Front foot elevated 4 inch step tandem stance 1 min x 1 bilateral c occasional hand assist on bar, SBA  Vaso 10 mins Lt knee in elevation medium compression 34 degrees     PATIENT EDUCATION:  09/23/2022 Education details: HEP, POC Person educated: Patient Education method: Consulting civil engineer, Media planner, Verbal cues, and Handouts Education comprehension: verbalized understanding, returned demonstration, and verbal cues required  HOME EXERCISE PROGRAM: Access Code: [email or web address] URL: [email or web address] Date: 09/23/2022 Prepared by: Scot Jun  Exercises - Seated Long Arc Quad (Mirrored)  - 3-5 x daily - 7 x weekly - 1-2 sets - 10 reps - 2 hold - Supine Heel Slide with Strap  - 3-5 x daily - 7 x weekly - 1-2 sets - 10 reps - 5 hold - Seated Quad Set (Mirrored)  - 3-5 x daily - 7 x weekly - 1 sets - 10 reps - 5 hold - Supine Knee Extension Mobilization with Weight (Mirrored)  - 3-5 x daily - 7 x weekly - 1 sets - 1 reps - to tolerance up to 15 mins hold  ASSESSMENT:  CLINICAL IMPRESSION: Pt has attended 18 visits overall and continued to show good overall progress.  See objective data for updated information.  ROM looking much better and reached goal for flexion.  Main remaining impairment for functional activity is strength to improve eccentric  control on stairs c Lt leg.  Continued skilled PT services warranted to help improve and progress towards goals and ultimate discharge to HEP.   OBJECTIVE IMPAIRMENTS: Abnormal gait, decreased activity tolerance, decreased balance, decreased endurance, decreased mobility, difficulty walking, decreased ROM, decreased strength, hypomobility, increased edema, impaired flexibility, improper body mechanics, and pain.   ACTIVITY LIMITATIONS: carrying, lifting, bending, sitting, standing, squatting, sleeping, stairs, transfers, bed mobility, bathing, dressing, and locomotion level  PARTICIPATION LIMITATIONS: meal prep, cleaning, laundry, interpersonal relationship, driving, shopping, and community activity  PERSONAL FACTORS:  CVA with Rt hemiparesis, HTN, neurogenic bladder  are also affecting patient's functional outcome.   REHAB POTENTIAL: Good  CLINICAL DECISION MAKING: Stable/uncomplicated  EVALUATION COMPLEXITY: Low   GOALS: Goals reviewed with patient? Yes  SHORT TERM GOALS: (target date for Short term goals are 3 weeks 10/14/2022)   1.  Patient will demonstrate independent use of home exercise program to maintain progress from in clinic treatments.  Goal status: Met 10/07/2022  LONG TERM GOALS: (target dates for all long term goals are 10 weeks  12/31/22 )   1. Patient will demonstrate/report pain at worst less than or equal to 2/10 to facilitate minimal limitation in daily activity secondary to pain symptoms.  Goal status: on going 11/21/2022   2. Patient will demonstrate independent use of home exercise program to facilitate ability to maintain/progress functional gains from skilled physical therapy services.  Goal status: on going 11/21/2022   3. Patient will demonstrate FOTO outcome > or = 52 % to indicate reduced disability due to condition.  Goal status: MET 11/21/2022   4.  Patient will demonstrate Lt  LE MMT 5/5 throughout to faciltiate usual transfers, stairs, squatting at  [facility] for daily life.   Goal status: Met strength 5/5 10/23/2022   5.  Patient will demonstrate Lt knee  AROM 0-110 deg to facilitate usual transfers, ambulation, and stairs at PLOF s limitation.  Goal status: on going 11/21/2022 (met flexion)   6.  Patient will demonstrate independent ambulation community distances > 300 ft to facilitate community ambulation.  Goal status: on going 11/21/2022   7.  Patient will demonstrate ascending/descending stairs c reciprocal gait pattern for household navigation.  Goal Status: on going 11/21/2022   PLAN:  PT FREQUENCY: 1-2x/week  PT DURATION: 8 weeks  PLANNED INTERVENTIONS: Therapeutic exercises, Therapeutic activity, Neuro Muscular re-education, Balance training, Gait training, Patient/Family education, Joint mobilization, Stair training, DME instructions, Dry Needling, Electrical stimulation, Traction, Cryotherapy, vasopneumatic deviceMoist heat, Taping, Ultrasound, Ionotophoresis 38m/ml Dexamethasone, and Manual therapy.  All included unless contraindicated  PLAN FOR NEXT SESSION: Continue eccentric strengthening for stair navigation down on Lt leg.    MScot Jun PT, DPT, OCS, ATC 11/21/22  2:27 PM

## 2022-11-25 ENCOUNTER — Other Ambulatory Visit: Payer: Self-pay

## 2022-11-25 ENCOUNTER — Ambulatory Visit (INDEPENDENT_AMBULATORY_CARE_PROVIDER_SITE_OTHER): Payer: Medicare PPO | Admitting: Orthopaedic Surgery

## 2022-11-25 ENCOUNTER — Encounter: Payer: Self-pay | Admitting: Orthopaedic Surgery

## 2022-11-25 DIAGNOSIS — M25551 Pain in right hip: Secondary | ICD-10-CM

## 2022-11-25 DIAGNOSIS — Z96652 Presence of left artificial knee joint: Secondary | ICD-10-CM

## 2022-11-25 NOTE — Progress Notes (Signed)
The patient is almost 3 months status post a left knee replacement.  She is 82 years old and very active.  She said the knee is doing well.  She did let me know that she is having some pain on the right side again.  She last had an injection in her right hip joint by Dr. Ernestina Patches back in August 2022 and she said that helped quite a bit.  She would like to have that injection again.  When we were discussing things I felt that she was describing back pain but I believe it has been more of her hip and not is an injection that helped her in the past.  She does have right hip arthritis.  Her left operative knee has almost full extension and full flexion.  The swelling is moderate to be expected.  Overall the knee looks great.  She has 4 more physical therapy sessions that she will continue to go to and transition to home exercise program after that.  She does ambulate with a cane but uses it sparingly.  From my standpoint I will see her back in 3 months with an AP and lateral of her left knee.  I told her we will set her up for an injection with Dr. Ernestina Patches again in the lumbar spine but I corrected myself after reviewing her chart and this will need to be on the right hip.  We will see if can get that set up soon.

## 2022-11-26 ENCOUNTER — Ambulatory Visit (INDEPENDENT_AMBULATORY_CARE_PROVIDER_SITE_OTHER): Payer: Medicare PPO | Admitting: Physical Therapy

## 2022-11-26 ENCOUNTER — Encounter: Payer: Self-pay | Admitting: Physical Therapy

## 2022-11-26 DIAGNOSIS — R262 Difficulty in walking, not elsewhere classified: Secondary | ICD-10-CM

## 2022-11-26 DIAGNOSIS — G8929 Other chronic pain: Secondary | ICD-10-CM

## 2022-11-26 DIAGNOSIS — M6281 Muscle weakness (generalized): Secondary | ICD-10-CM | POA: Diagnosis not present

## 2022-11-26 DIAGNOSIS — R600 Localized edema: Secondary | ICD-10-CM

## 2022-11-26 DIAGNOSIS — M25562 Pain in left knee: Secondary | ICD-10-CM | POA: Diagnosis not present

## 2022-11-26 NOTE — Therapy (Signed)
OUTPATIENT PHYSICAL THERAPY TREATMENT    Patient Name: Teresa Martinez MRN: [id number] DOB:1941/07/14, 82 y.o., female Today's Date: 11/26/2022   END OF SESSION:   PT End of Session - 11/26/22 1340     Visit Number 19    Number of Visits 24    Date for PT Re-Evaluation 12/31/22    Authorization Type [employer] medicare $20 copay    Authorization Time Period -12/31/2021    Authorization - Visit Number 7    Authorization - Number of Visits 12    Progress Note Due on Visit 24    PT Start Time 2725    PT Stop Time 1420    PT Time Calculation (min) 40 min    Activity Tolerance Patient tolerated treatment well    Behavior During Therapy WFL for tasks assessed/performed               Past Medical History:  Diagnosis Date   Cervical spine degeneration    C5/C6   Chronic right shoulder pain    GERD (gastroesophageal reflux disease)    Hemiparesis affecting dominant side as late effect of cerebrovascular accident ([place]) 12/07/2014   Hyperlipemia    Hypertension    Menopausal syndrome    [name]   Overactive bladder    Statin intolerance    Stroke ([place]) 2015   Past Surgical History:  Procedure Laterality Date   ABDOMINAL HYSTERECTOMY     1985   BAND HEMORRHOIDECTOMY     2010   CATARACT EXTRACTION, BILATERAL     CHOLECYSTECTOMY     1950   LUMBAR LAMINECTOMY Bilateral 08/03/2018   Dr. Octavio Manns, L3 laminotomy, L4-L5 laminectomy with neural foraminal decompression.   PAROTIDECTOMY     30 years ago   TOTAL KNEE ARTHROPLASTY Left 09/10/2022   Procedure: LEFT TOTAL KNEE ARTHROPLASTY;  Surgeon: Mcarthur Rossetti, MD;  Location: [place];  Service: Orthopedics;  Laterality: Left;   Patient Active Problem List   Diagnosis Date Noted   Status post left knee replacement 09/10/2022   IGT (impaired glucose tolerance) 07/27/2021   Preop testing 07/09/2018   Degenerative lumbar spinal stenosis 05/12/2018   Statin myopathy 08/29/2016   Iliotibial band  syndrome of right side 04/23/2016   Acromioclavicular arthrosis 06/30/2015   Left carpal tunnel syndrome 05/15/2015   Adhesive capsulitis of right shoulder 02/06/2015   Dysarthria due to cerebrovascular accident 01/09/2015   Hemiparesis affecting dominant side as late effect of cerebrovascular accident ([place]) 12/07/2014   Spastic neurogenic bladder 10/31/2014   Right rotator cuff tendonitis 10/19/2014   Stenosis of cervical spine region    Chronic right shoulder pain    Cerebral infarction due to thrombosis of left middle cerebral artery ([place])    Left pontine stroke ([place])    TIA (transient ischemic attack) 10/14/2014   Overactive bladder 10/14/2014   H/O: CVA (cerebrovascular accident) 10/14/2014   NECK PAIN, CHRONIC 10/29/2010   Essential hypertension 10/10/2009   INSOMNIA 09/15/2009   Pure hypercholesterolemia 11/28/2008   MENOPAUSAL SYNDROME 11/28/2008    PCP: Betsy Coder MD  REFERRING PROVIDER: Mcarthur Rossetti, MD  REFERRING DIAG: [phone] (ICD-10-CM) - Status post left knee replacement M17.12 (ICD-10-CM) - Unilateral primary osteoarthritis, left knee  THERAPY DIAG:  Chronic pain of left knee  Muscle weakness (generalized)  Difficulty in walking, not elsewhere classified  Localized edema  Rationale for Evaluation and Treatment: Rehabilitation  ONSET DATE: 09/10/2022  SUBJECTIVE:   SUBJECTIVE STATEMENT: She indicated MD was pleased, she always has  some stiffness and soreness but not really pain in her left knee   PERTINENT HISTORY: PMH: CVA with Rt hemiparesis, HTN, neurogenic bladder  PAIN:  NPRS scale:  0/10 pain at rest.  Sore in lateral knee with movement Pain location: Lt knee  Pain description: achy tightness Aggravating factors: end range pain, ache all the time Relieving factors: avoid pain  PRECAUTIONS: None  WEIGHT BEARING RESTRICTIONS: No  FALLS:  Has patient fallen in last 6 months? 1 fall Do you have a fear of falling?   nope  LIVING ENVIRONMENT: Lives with: primary alone but has children staying at this time.  Lives in: House/apartment Stairs:5 to enter at front, 2 in back with handrail on Lt  Has following equipment at home:FWW, SPC  OCCUPATION: Retired  PLOF: Independent, hobbies - read, exercise routine  PATIENT GOALS: Walking without cane or walker, strengthening.    OBJECTIVE:   PATIENT SURVEYS:  11/21/2022: FOTO update:  52  11/06/2022  : FOTO update:  51  10/23/2022:  FOTO update:  33  09/23/2022 FOTO intake: 33   predicted:  52  COGNITION: 09/23/2022 Overall cognitive status: WFL    EDEMA:  09/23/2022 Localized edema in Lt knee noted visually.   PALPATION: 09/23/2022 Mild tenderness joint line, posterior knee joint.   LOWER EXTREMITY MMT:   MMT Right 09/23/2022 Left 09/23/2022 Right 10/23/2022 Left 10/23/2022 Left 11/05/2022 Right 11/21/2022 Left 11/21/2022  Hip flexion 5/5 4/5       Hip extension         Hip abduction         Hip adduction         Hip internal rotation         Hip external rotation         Knee flexion 5/5 4/5  5/5     Knee extension 5/5 2/5 5/5 38, 39.6 lbs 5/5 35, 37 lbs 5/5 40, 43 lbs 5/5 54.7, 55 lbs 5/5 38, 40 lbs  Ankle dorsiflexion 5/5 5/5       Ankle plantarflexion         Ankle inversion         Ankle eversion          (Blank rows = not tested)  LOWER EXTREMITY ROM:  ROM Left 09/23/2022 Left 10/01/2022 Left 10/09/2022 Left 10/23/2022 Left 11/05/2022 Left 11/21/2022  Hip flexion        Hip extension        Hip abduction        Hip adduction        Hip internal rotation        Hip external rotation        Knee flexion 80 AROM in supine heel slide 91 AROM in supine heel slide 105 AROM in supine heel slide 114 AROM in supine heel slide 118 AROM in supine heel slide 123 AROM in supine heel slide  Knee extension -10 AROM in seated LAQ.  -5 in supine quad set AROM  -8 AROM in quad set in supine -6 AROM in seated LAQ -4 AROM in seated LAQ -4  AROM in seated LAQ  -1 in supine quad set AROM  Ankle dorsiflexion        Ankle plantarflexion        Ankle inversion        Ankle eversion         (Blank rows = not tested)   FUNCTIONAL TESTS:    11/14/2022:  5 x sit  to stand 18 inch chair 18.23 seconds no UE assist.   09/23/2022 18 inch chair transfer: unable without UE assist Lt SLS: unable unassisted   GAIT: 11/13/2022:  Able to perform independent ambulation in clinic with Rt leg limitation (foot drop) noted more than any Lt leg.  SPC use into and out of clinic.   10/23/2022:  Mod independent c SPC.  Times of Rt foot toe drag in swing.  Lt knee Lacking end range in stance  10/16/2022:  Mod independent c SPC within clinic  10/07/2022:  SPC use in clinic c supervision  09/23/2022 FWW ambulation c reduced stance on Lt leg, maintained knee flexion in stance.   TODAY'S TREATMENT                                                                                 DATE:11/26/2022 Therex: UBE LE only Lvl 3 8 mins full revolutions Leg press Double leg 93 lbs 2X15  Lt Single leg 2 x 15 56 lbs, Rt SL 81# 2X15 Knee extension machine Double leg up, Lt leg lowering slowly 2 x 10 20 lbs, then left leg only 10# 2X10 Stairs in clinic hall 2 UE support to descend reciprocally, one UE support to ascend reciprocally 2 flights Sit to stands no UE support X 10 reps Seated hamstring stretch 30 sec x 3  Neuro Re-ed Fwd/back fitter board rocker light touching focus c SBA to occasional min A 25x  Standing on foam 6 inch box toe tapping alternating 2 x 10 bilateral c SBA to occasional min A Tandem walk on foam beam and sidestepping on foam beam 3 round trips each with close guarding to min A   TODAY'S TREATMENT                                                                                 DATE:11/21/2022 Therex: UBE LE only Lvl 3 8 mins full revolutions Knee extension machine Double leg up, Lt leg lowering slowly 20 lbs 3 x 10  Leg press Lt Single  leg 2 x 15 62 lbs c very slow lowering focus Incline board bilateral gastroc 30 sec x 3  Neuro Re-ed SLS on black mat c contralateral leg touching corner of mat x 8 WB on Lt leg    PATIENT EDUCATION:  09/23/2022 Education details: HEP, POC Person educated: Patient Education method: Programmer, multimedia, Facilities manager, Verbal cues, and Handouts Education comprehension: verbalized understanding, returned demonstration, and verbal cues required  HOME EXERCISE PROGRAM: Access Code: [email or web address] URL: [email or web address] Date: 09/23/2022 Prepared by: Chyrel Masson  Exercises - Seated Long Arc Quad (Mirrored)  - 3-5 x daily - 7 x weekly - 1-2 sets - 10 reps - 2 hold - Supine Heel Slide with Strap  - 3-5 x daily - 7 x weekly - 1-2 sets - 10 reps - 5 hold - Seated Quad Set (Mirrored)  -  3-5 x daily - 7 x weekly - 1 sets - 10 reps - 5 hold - Supine Knee Extension Mobilization with Weight (Mirrored)  - 3-5 x daily - 7 x weekly - 1 sets - 1 reps - to tolerance up to 15 mins hold  ASSESSMENT:  CLINICAL IMPRESSION: Continued to work on left knee strength and worked on stairs today with good tolerance. Added more dynamic balance challenges as she still has some gait unsteadiness. Overall progressing well with left knee post op and  continued skilled PT services warranted to help improve and progress towards goals and ultimate discharge to HEP.   OBJECTIVE IMPAIRMENTS: Abnormal gait, decreased activity tolerance, decreased balance, decreased endurance, decreased mobility, difficulty walking, decreased ROM, decreased strength, hypomobility, increased edema, impaired flexibility, improper body mechanics, and pain.   ACTIVITY LIMITATIONS: carrying, lifting, bending, sitting, standing, squatting, sleeping, stairs, transfers, bed mobility, bathing, dressing, and locomotion level  PARTICIPATION LIMITATIONS: meal prep, cleaning, laundry, interpersonal relationship, driving, shopping, and community  activity  PERSONAL FACTORS:  CVA with Rt hemiparesis, HTN, neurogenic bladder  are also affecting patient's functional outcome.   REHAB POTENTIAL: Good  CLINICAL DECISION MAKING: Stable/uncomplicated  EVALUATION COMPLEXITY: Low   GOALS: Goals reviewed with patient? Yes  SHORT TERM GOALS: (target date for Short term goals are 3 weeks 10/14/2022)   1.  Patient will demonstrate independent use of home exercise program to maintain progress from in clinic treatments.  Goal status: Met 10/07/2022  LONG TERM GOALS: (target dates for all long term goals are 10 weeks  12/31/22 )   1. Patient will demonstrate/report pain at worst less than or equal to 2/10 to facilitate minimal limitation in daily activity secondary to pain symptoms.  Goal status: on going 11/21/2022   2. Patient will demonstrate independent use of home exercise program to facilitate ability to maintain/progress functional gains from skilled physical therapy services.  Goal status: on going 11/21/2022   3. Patient will demonstrate FOTO outcome > or = 52 % to indicate reduced disability due to condition.  Goal status: MET 11/21/2022   4.  Patient will demonstrate Lt  LE MMT 5/5 throughout to faciltiate usual transfers, stairs, squatting at [facility] for daily life.   Goal status: Met strength 5/5 10/23/2022   5.  Patient will demonstrate Lt knee AROM 0-110 deg to facilitate usual transfers, ambulation, and stairs at PLOF s limitation.  Goal status: on going 11/21/2022 (met flexion)   6.  Patient will demonstrate independent ambulation community distances > 300 ft to facilitate community ambulation.  Goal status: on going 11/21/2022   7.  Patient will demonstrate ascending/descending stairs c reciprocal gait pattern for household navigation.  Goal Status: on going 11/21/2022   PLAN:  PT FREQUENCY: 1-2x/week  PT DURATION: 8 weeks  PLANNED INTERVENTIONS: Therapeutic exercises, Therapeutic activity, Neuro Muscular re-education,  Balance training, Gait training, Patient/Family education, Joint mobilization, Stair training, DME instructions, Dry Needling, Electrical stimulation, Traction, Cryotherapy, vasopneumatic deviceMoist heat, Taping, Ultrasound, Ionotophoresis 4mg /ml Dexamethasone, and Manual therapy.  All included unless contraindicated  PLAN FOR NEXT SESSION: Continue eccentric strengthening for stair navigation down on Lt leg.   , PT, DPT 11/26/22 1:44 PM

## 2022-11-28 ENCOUNTER — Ambulatory Visit (INDEPENDENT_AMBULATORY_CARE_PROVIDER_SITE_OTHER): Payer: Medicare PPO | Admitting: Rehabilitative and Restorative Service Providers"

## 2022-11-28 ENCOUNTER — Encounter: Payer: Self-pay | Admitting: Rehabilitative and Restorative Service Providers"

## 2022-11-28 DIAGNOSIS — R262 Difficulty in walking, not elsewhere classified: Secondary | ICD-10-CM | POA: Diagnosis not present

## 2022-11-28 DIAGNOSIS — R600 Localized edema: Secondary | ICD-10-CM

## 2022-11-28 DIAGNOSIS — M25562 Pain in left knee: Secondary | ICD-10-CM | POA: Diagnosis not present

## 2022-11-28 DIAGNOSIS — M6281 Muscle weakness (generalized): Secondary | ICD-10-CM | POA: Diagnosis not present

## 2022-11-28 DIAGNOSIS — G8929 Other chronic pain: Secondary | ICD-10-CM | POA: Diagnosis not present

## 2022-11-28 NOTE — Therapy (Addendum)
OUTPATIENT PHYSICAL THERAPY TREATMENT    Patient Name: Teresa Martinez MRN: [id number] DOB:Oct 28, 1941, 82 y.o., female Today's Date: 11/28/2022   END OF SESSION:   PT End of Session - 11/28/22 1340     Visit Number 20    Number of Visits 24    Date for PT Re-Evaluation 12/31/22    Authorization Type [employer] medicare $20 copay    Authorization Time Period -12/31/2021    Authorization - Visit Number 8    Authorization - Number of Visits 12    Progress Note Due on Visit 24    PT Start Time 1340    PT Stop Time 1419    PT Time Calculation (min) 39 min    Activity Tolerance Patient tolerated treatment well    Behavior During Therapy WFL for tasks assessed/performed                Past Medical History:  Diagnosis Date   Cervical spine degeneration    C5/C6   Chronic right shoulder pain    GERD (gastroesophageal reflux disease)    Hemiparesis affecting dominant side as late effect of cerebrovascular accident (HCC) 12/07/2014   Hyperlipemia    Hypertension    Menopausal syndrome    [name]   Overactive bladder    Statin intolerance    Stroke (HCC) 2015   Past Surgical History:  Procedure Laterality Date   ABDOMINAL HYSTERECTOMY     1985   BAND HEMORRHOIDECTOMY     2010   CATARACT EXTRACTION, BILATERAL     CHOLECYSTECTOMY     1950   LUMBAR LAMINECTOMY Bilateral 08/03/2018   Dr. Christain Sacramento, L3 laminotomy, L4-L5 laminectomy with neural foraminal decompression.   PAROTIDECTOMY     30 years ago   TOTAL KNEE ARTHROPLASTY Left 09/10/2022   Procedure: LEFT TOTAL KNEE ARTHROPLASTY;  Surgeon: Kathryne Hitch, MD;  Location: [facility] OR;  Service: Orthopedics;  Laterality: Left;   Patient Active Problem List   Diagnosis Date Noted   Status post left knee replacement 09/10/2022   IGT (impaired glucose tolerance) 07/27/2021   Preop testing 07/09/2018   Degenerative lumbar spinal stenosis 05/12/2018   Statin myopathy 08/29/2016   Iliotibial band  syndrome of right side 04/23/2016   Acromioclavicular arthrosis 06/30/2015   Left carpal tunnel syndrome 05/15/2015   Adhesive capsulitis of right shoulder 02/06/2015   Dysarthria due to cerebrovascular accident 01/09/2015   Hemiparesis affecting dominant side as late effect of cerebrovascular accident (HCC) 12/07/2014   Spastic neurogenic bladder 10/31/2014   Right rotator cuff tendonitis 10/19/2014   Stenosis of cervical spine region    Chronic right shoulder pain    Cerebral infarction due to thrombosis of left middle cerebral artery (HCC)    Left pontine stroke (HCC)    TIA (transient ischemic attack) 10/14/2014   Overactive bladder 10/14/2014   H/O: CVA (cerebrovascular accident) 10/14/2014   NECK PAIN, CHRONIC 10/29/2010   Essential hypertension 10/10/2009   INSOMNIA 09/15/2009   Pure hypercholesterolemia 11/28/2008   MENOPAUSAL SYNDROME 11/28/2008    PCP: Townsend Roger MD  REFERRING PROVIDER: Kathryne Hitch, MD  REFERRING DIAG: [phone] (ICD-10-CM) - Status post left knee replacement M17.12 (ICD-10-CM) - Unilateral primary osteoarthritis, left knee  THERAPY DIAG:  Chronic pain of left knee  Muscle weakness (generalized)  Difficulty in walking, not elsewhere classified  Localized edema  Rationale for Evaluation and Treatment: Rehabilitation  ONSET DATE: 09/10/2022  SUBJECTIVE:   SUBJECTIVE STATEMENT: She indicated she hasn't heard yet about  scheduling for injection regarding back.  Overall doing similarly well with knee.  Wants to finish out her visits that are scheduled.   PERTINENT HISTORY: PMH: CVA with Rt hemiparesis, HTN, neurogenic bladder  PAIN:  NPRS scale: "hurts but not a pain" 0/10 upon arrival. Pain location: Lt knee  Pain description: achy tightness Aggravating factors: walking Relieving factors: avoid pain  PRECAUTIONS: None  WEIGHT BEARING RESTRICTIONS: No  FALLS:  Has patient fallen in last 6 months? 1 fall Do you  have a fear of falling?  nope  LIVING ENVIRONMENT: Lives with: primary alone but has children staying at this time.  Lives in: House/apartment Stairs:5 to enter at front, 2 in back with handrail on Lt  Has following equipment at home:FWW, SPC  OCCUPATION: Retired  PLOF: Independent, hobbies - read, exercise routine  PATIENT GOALS: Walking without cane or walker, strengthening.    OBJECTIVE:   PATIENT SURVEYS:  11/21/2022: FOTO update:  52  11/06/2022  : FOTO update:  51  10/23/2022:  FOTO update:  33  09/23/2022 FOTO intake: 33   predicted:  52  COGNITION: 09/23/2022 Overall cognitive status: WFL    EDEMA:  09/23/2022 Localized edema in Lt knee noted visually.   PALPATION: 09/23/2022 Mild tenderness joint line, posterior knee joint.   LOWER EXTREMITY MMT:   MMT Right 09/23/2022 Left 09/23/2022 Right 10/23/2022 Left 10/23/2022 Left 11/05/2022 Right 11/21/2022 Left 11/21/2022  Hip flexion 5/5 4/5       Hip extension         Hip abduction         Hip adduction         Hip internal rotation         Hip external rotation         Knee flexion 5/5 4/5  5/5     Knee extension 5/5 2/5 5/5 38, 39.6 lbs 5/5 35, 37 lbs 5/5 40, 43 lbs 5/5 54.7, 55 lbs 5/5 38, 40 lbs  Ankle dorsiflexion 5/5 5/5       Ankle plantarflexion         Ankle inversion         Ankle eversion          (Blank rows = not tested)  LOWER EXTREMITY ROM:  ROM Left 09/23/2022 Left 10/01/2022 Left 10/09/2022 Left 10/23/2022 Left 11/05/2022 Left 11/21/2022  Hip flexion        Hip extension        Hip abduction        Hip adduction        Hip internal rotation        Hip external rotation        Knee flexion 80 AROM in supine heel slide 91 AROM in supine heel slide 105 AROM in supine heel slide 114 AROM in supine heel slide 118 AROM in supine heel slide 123 AROM in supine heel slide  Knee extension -10 AROM in seated LAQ.  -5 in supine quad set AROM  -8 AROM in quad set in supine -6 AROM in seated LAQ  -4 AROM in seated LAQ -4 AROM in seated LAQ  -1 in supine quad set AROM  Ankle dorsiflexion        Ankle plantarflexion        Ankle inversion        Ankle eversion         (Blank rows = not tested)   FUNCTIONAL TESTS:    11/14/2022:  5 x sit  to stand 18 inch chair 18.23 seconds no UE assist.   09/23/2022 18 inch chair transfer: unable without UE assist Lt SLS: unable unassisted   GAIT: 11/13/2022:  Able to perform independent ambulation in clinic with Rt leg limitation (foot drop) noted more than any Lt leg.  SPC use into and out of clinic.   10/23/2022:  Mod independent c SPC.  Times of Rt foot toe drag in swing.  Lt knee Lacking end range in stance  10/16/2022:  Mod independent c SPC within clinic  10/07/2022:  SPC use in clinic c supervision  09/23/2022 FWW ambulation c reduced stance on Lt leg, maintained knee flexion in stance.   TODAY'S TREATMENT                                                                                 DATE:11/28/2022 Therex: UBE LE only Lvl 3.5 8 mins full revolutions Leg press Double leg 93 lbs 2X15  Lt Single leg 2 x 15 56 lbs Knee extension machine Double leg up, Lt leg lowering slowly 3 x 10 20 lbs  Step on over and down 6 inch WB on Lt leg x 20 with single hand assist  Seated SLR slow controlled focus 2 x 10 bilateral  Neuro Re-ed SLS c anterior cone touching 3 cones c x8 bilaterally CGA to min A at times  Tandem stance on foam 1 min x 2 bilateral c occasional hand assist required c SBA  TODAY'S TREATMENT                                                                                 DATE:11/26/2022 Therex: UBE LE only Lvl 3 8 mins full revolutions Leg press Double leg 93 lbs 2X15  Lt Single leg 2 x 15 56 lbs, Rt SL 81# 2X15 Knee extension machine Double leg up, Lt leg lowering slowly 2 x 10 20 lbs, then left leg only 10# 2X10 Stairs in clinic hall 2 UE support to descend reciprocally, one UE support to ascend reciprocally 2 flights Sit  to stands no UE support X 10 reps Seated hamstring stretch 30 sec x 3  Neuro Re-ed Fwd/back fitter board rocker light touching focus c SBA to occasional min A 25x  Standing on foam 6 inch box toe tapping alternating 2 x 10 bilateral c SBA to occasional min A Tandem walk on foam beam and sidestepping on foam beam 3 round trips each with close guarding to min A   TODAY'S TREATMENT  DATE:11/21/2022 Therex: UBE LE only Lvl 3 8 mins full revolutions Knee extension machine Double leg up, Lt leg lowering slowly 20 lbs 3 x 10  Leg press Lt Single leg 2 x 15 62 lbs c very slow lowering focus Incline board bilateral gastroc 30 sec x 3  Neuro Re-ed SLS on black mat c contralateral leg touching corner of mat x 8 WB on Lt leg    PATIENT EDUCATION:  09/23/2022 Education details: HEP, POC Person educated: Patient Education method: Programmer, multimedia, Facilities manager, Verbal cues, and Handouts Education comprehension: verbalized understanding, returned demonstration, and verbal cues required  HOME EXERCISE PROGRAM: Access Code: [email or web address] URL: [email or web address] Date: 09/23/2022 Prepared by: Chyrel Masson  Exercises - Seated Long Arc Quad (Mirrored)  - 3-5 x daily - 7 x weekly - 1-2 sets - 10 reps - 2 hold - Supine Heel Slide with Strap  - 3-5 x daily - 7 x weekly - 1-2 sets - 10 reps - 5 hold - Seated Quad Set (Mirrored)  - 3-5 x daily - 7 x weekly - 1 sets - 10 reps - 5 hold - Supine Knee Extension Mobilization with Weight (Mirrored)  - 3-5 x daily - 7 x weekly - 1 sets - 1 reps - to tolerance up to 15 mins hold  ASSESSMENT:  CLINICAL IMPRESSION: Fair control on balance intervention.  Improvement to benefit ambulation without SPC.   OBJECTIVE IMPAIRMENTS: Abnormal gait, decreased activity tolerance, decreased balance, decreased endurance, decreased mobility, difficulty walking, decreased ROM, decreased  strength, hypomobility, increased edema, impaired flexibility, improper body mechanics, and pain.   ACTIVITY LIMITATIONS: carrying, lifting, bending, sitting, standing, squatting, sleeping, stairs, transfers, bed mobility, bathing, dressing, and locomotion level  PARTICIPATION LIMITATIONS: meal prep, cleaning, laundry, interpersonal relationship, driving, shopping, and community activity  PERSONAL FACTORS:  CVA with Rt hemiparesis, HTN, neurogenic bladder  are also affecting patient's functional outcome.   REHAB POTENTIAL: Good  CLINICAL DECISION MAKING: Stable/uncomplicated  EVALUATION COMPLEXITY: Low   GOALS: Goals reviewed with patient? Yes  SHORT TERM GOALS: (target date for Short term goals are 3 weeks 10/14/2022)   1.  Patient will demonstrate independent use of home exercise program to maintain progress from in clinic treatments.  Goal status: Met 10/07/2022  LONG TERM GOALS: (target dates for all long term goals are 10 weeks  12/31/22 )   1. Patient will demonstrate/report pain at worst less than or equal to 2/10 to facilitate minimal limitation in daily activity secondary to pain symptoms.  Goal status: on going 11/21/2022   2. Patient will demonstrate independent use of home exercise program to facilitate ability to maintain/progress functional gains from skilled physical therapy services.  Goal status: on going 11/21/2022   3. Patient will demonstrate FOTO outcome > or = 52 % to indicate reduced disability due to condition.  Goal status: MET 11/21/2022   4.  Patient will demonstrate Lt  LE MMT 5/5 throughout to faciltiate usual transfers, stairs, squatting at [facility] for daily life.   Goal status: Met strength 5/5 10/23/2022   5.  Patient will demonstrate Lt knee AROM 0-110 deg to facilitate usual transfers, ambulation, and stairs at PLOF s limitation.  Goal status: on going 11/21/2022 (met flexion)   6.  Patient will demonstrate independent ambulation community distances  > 300 ft to facilitate community ambulation.  Goal status: on going 11/21/2022   7.  Patient will demonstrate ascending/descending stairs c reciprocal gait pattern for household navigation.  Goal Status: on going 11/21/2022   PLAN:  PT FREQUENCY: 1-2x/week  PT DURATION: 8 weeks  PLANNED INTERVENTIONS: Therapeutic exercises, Therapeutic activity, Neuro Muscular re-education, Balance training, Gait training, Patient/Family education, Joint mobilization, Stair training, DME instructions, Dry Needling, Electrical stimulation, Traction, Cryotherapy, vasopneumatic deviceMoist heat, Taping, Ultrasound, Ionotophoresis 4mg /ml Dexamethasone, and Manual therapy.  All included unless contraindicated  PLAN FOR NEXT SESSION: Compliant surface balance, strengthening.  Prep for HEP transitioning.   Scot Jun, PT, DPT, OCS, ATC 11/28/22  2:17 PM

## 2022-12-02 ENCOUNTER — Other Ambulatory Visit: Payer: Self-pay | Admitting: Internal Medicine

## 2022-12-02 DIAGNOSIS — E7800 Pure hypercholesterolemia, unspecified: Secondary | ICD-10-CM

## 2022-12-02 DIAGNOSIS — I10 Essential (primary) hypertension: Secondary | ICD-10-CM

## 2022-12-02 DIAGNOSIS — K219 Gastro-esophageal reflux disease without esophagitis: Secondary | ICD-10-CM

## 2022-12-03 ENCOUNTER — Encounter: Payer: Medicare PPO | Admitting: Rehabilitative and Restorative Service Providers"

## 2022-12-05 ENCOUNTER — Ambulatory Visit (INDEPENDENT_AMBULATORY_CARE_PROVIDER_SITE_OTHER): Payer: Medicare PPO | Admitting: Rehabilitative and Restorative Service Providers"

## 2022-12-05 ENCOUNTER — Encounter: Payer: Self-pay | Admitting: Rehabilitative and Restorative Service Providers"

## 2022-12-05 DIAGNOSIS — R600 Localized edema: Secondary | ICD-10-CM

## 2022-12-05 DIAGNOSIS — G8929 Other chronic pain: Secondary | ICD-10-CM

## 2022-12-05 DIAGNOSIS — M25562 Pain in left knee: Secondary | ICD-10-CM

## 2022-12-05 DIAGNOSIS — M6281 Muscle weakness (generalized): Secondary | ICD-10-CM

## 2022-12-05 DIAGNOSIS — R262 Difficulty in walking, not elsewhere classified: Secondary | ICD-10-CM | POA: Diagnosis not present

## 2022-12-05 NOTE — Therapy (Signed)
OUTPATIENT PHYSICAL THERAPY TREATMENT    Patient Name: Teresa Martinez MRN: [id number] DOB:[birth year], 82 y.o., female Today's Date: 12/05/2022   END OF SESSION:   PT End of Session - 12/05/22 1340     Visit Number 21    Number of Visits 24    Date for PT Re-Evaluation 12/31/22    Authorization Type [employer] medicare $20 copay    Authorization Time Period -12/31/2021    Authorization - Visit Number 9    Authorization - Number of Visits 12    Progress Note Due on Visit 24    PT Start Time 1341    PT Stop Time 1420    PT Time Calculation (min) 39 min    Activity Tolerance Patient tolerated treatment well    Behavior During Therapy WFL for tasks assessed/performed                 Past Medical History:  Diagnosis Date   Cervical spine degeneration    C5/C6   Chronic right shoulder pain    GERD (gastroesophageal reflux disease)    Hemiparesis affecting dominant side as late effect of cerebrovascular accident (HCC) 12/07/2014   Hyperlipemia    Hypertension    Menopausal syndrome    [name]   Overactive bladder    Statin intolerance    Stroke (HCC) 2015   Past Surgical History:  Procedure Laterality Date   ABDOMINAL HYSTERECTOMY     1985   BAND HEMORRHOIDECTOMY     2010   CATARACT EXTRACTION, BILATERAL     CHOLECYSTECTOMY     1950   LUMBAR LAMINECTOMY Bilateral 08/03/2018   Dr. Christain [place], L3 laminotomy, L4-L5 laminectomy with neural foraminal decompression.   PAROTIDECTOMY     30 years ago   TOTAL KNEE ARTHROPLASTY Left 09/10/2022   Procedure: LEFT TOTAL KNEE ARTHROPLASTY;  Surgeon: Kathryne Hitch, MD;  Location: [facility] OR;  Service: Orthopedics;  Laterality: Left;   Patient Active Problem List   Diagnosis Date Noted   Status post left knee replacement 09/10/2022   IGT (impaired glucose tolerance) 07/27/2021   Preop testing 07/09/2018   Degenerative lumbar spinal stenosis 05/12/2018   Statin myopathy 08/29/2016   Iliotibial band  syndrome of right side 04/23/2016   Acromioclavicular arthrosis 06/30/2015   Left carpal tunnel syndrome 05/15/2015   Adhesive capsulitis of right shoulder 02/06/2015   Dysarthria due to cerebrovascular accident 01/09/2015   Hemiparesis affecting dominant side as late effect of cerebrovascular accident (HCC) 12/07/2014   Spastic neurogenic bladder 10/31/2014   Right rotator cuff tendonitis 10/19/2014   Stenosis of cervical spine region    Chronic right shoulder pain    Cerebral infarction due to thrombosis of left middle cerebral artery (HCC)    Left pontine stroke (HCC)    TIA (transient ischemic attack) 10/14/2014   Overactive bladder 10/14/2014   H/O: CVA (cerebrovascular accident) 10/14/2014   NECK PAIN, CHRONIC 10/29/2010   Essential hypertension 10/10/2009   INSOMNIA 09/15/2009   Pure hypercholesterolemia 11/28/2008   MENOPAUSAL SYNDROME 11/28/2008    PCP: Townsend Roger MD  REFERRING PROVIDER: Kathryne Hitch, MD  REFERRING DIAG: [id number] (ICD-10-CM) - Status post left knee replacement M17.12 (ICD-10-CM) - Unilateral primary osteoarthritis, left knee  THERAPY DIAG:  Chronic pain of left knee  Muscle weakness (generalized)  Difficulty in walking, not elsewhere classified  Localized edema  Rationale for Evaluation and Treatment: Rehabilitation  ONSET DATE: 09/10/2022  SUBJECTIVE:   SUBJECTIVE STATEMENT: Pt indicated no pain , just  sore and stiff.    PERTINENT HISTORY: PMH: CVA with Rt hemiparesis, HTN, neurogenic bladder  PAIN:  NPRS scale: "hurts but not a pain" 0/10 upon arrival. Pain location: Lt knee  Pain description: achy tightness Aggravating factors: walking Relieving factors: avoid pain  PRECAUTIONS: None  WEIGHT BEARING RESTRICTIONS: No  FALLS:  Has patient fallen in last 6 months? 1 fall Do you have a fear of falling?  nope  LIVING ENVIRONMENT: Lives with: primary alone but has children staying at this time.  Lives  in: House/apartment Stairs:5 to enter at front, 2 in back with handrail on Lt  Has following equipment at home:FWW, SPC  OCCUPATION: Retired  PLOF: Independent, hobbies - read, exercise routine  PATIENT GOALS: Walking without cane or walker, strengthening.    OBJECTIVE:   PATIENT SURVEYS:  11/21/2022: FOTO update:  52  11/06/2022  : FOTO update:  51  10/23/2022:  FOTO update:  33  09/23/2022 FOTO intake: 33   predicted:  52  COGNITION: 09/23/2022 Overall cognitive status: WFL    EDEMA:  09/23/2022 Localized edema in Lt knee noted visually.   PALPATION: 09/23/2022 Mild tenderness joint line, posterior knee joint.   LOWER EXTREMITY MMT:   MMT Right 09/23/2022 Left 09/23/2022 Right 10/23/2022 Left 10/23/2022 Left 11/05/2022 Right 11/21/2022 Left 11/21/2022 Left 12/05/2022  Hip flexion 5/5 4/5        Hip extension          Hip abduction          Hip adduction          Hip internal rotation          Hip external rotation          Knee flexion 5/5 4/5  5/5      Knee extension 5/5 2/5 5/5 38, 39.6 lbs 5/5 35, 37 lbs 5/5 40, 43 lbs 5/5 54.7, 55 lbs 5/5 38, 40 lbs 5/5 43.1, 42 lbs  Ankle dorsiflexion 5/5 5/5        Ankle plantarflexion          Ankle inversion          Ankle eversion           (Blank rows = not tested)  LOWER EXTREMITY ROM:  ROM Left 09/23/2022 Left 10/01/2022 Left 10/09/2022 Left 10/23/2022 Left 11/05/2022 Left 11/21/2022  Hip flexion        Hip extension        Hip abduction        Hip adduction        Hip internal rotation        Hip external rotation        Knee flexion 80 AROM in supine heel slide 91 AROM in supine heel slide 105 AROM in supine heel slide 114 AROM in supine heel slide 118 AROM in supine heel slide 123 AROM in supine heel slide  Knee extension -10 AROM in seated LAQ.  -5 in supine quad set AROM  -8 AROM in quad set in supine -6 AROM in seated LAQ -4 AROM in seated LAQ -4 AROM in seated LAQ  -1 in supine quad set AROM  Ankle  dorsiflexion        Ankle plantarflexion        Ankle inversion        Ankle eversion         (Blank rows = not tested)   FUNCTIONAL TESTS:    11/14/2022:  5 x sit to  stand 18 inch chair 18.23 seconds no UE assist.   09/23/2022 18 inch chair transfer: unable without UE assist Lt SLS: unable unassisted   GAIT: 11/13/2022:  Able to perform independent ambulation in clinic with Rt leg limitation (foot drop) noted more than any Lt leg.  SPC use into and out of clinic.   10/23/2022:  Mod independent c SPC.  Times of Rt foot toe drag in swing.  Lt knee Lacking end range in stance  10/16/2022:  Mod independent c SPC within clinic  10/07/2022:  [place] use in clinic c supervision  09/23/2022 FWW ambulation c reduced stance on Lt leg, maintained knee flexion in stance.   TODAY'S TREATMENT                                                                                 DATE:12/05/2022 Therex: UBE LE only Lvl 3.0 9 mins full revolutions Leg press Double leg 106 lbs 2X15  Lt Single leg 2 x 15 56 lbs Knee extension machine Double leg up, Lt leg lowering slowly 3 x 10 20 lbs   Neuro Re-ed Tandem stance on ground x 1 min bilateral in // bars occasional HHA Fitter rocker board fwd/back 25 each with CGA to min A throughout to prevent loss of balance.   TherActivity Ascending/descending full flight of stairs in clinic with single hand rail assist and supervision  TODAY'S TREATMENT                                                                                 DATE:11/28/2022 Therex: UBE LE only Lvl 3.5 8 mins full revolutions Leg press Double leg 93 lbs 2X15  Lt Single leg 2 x 15 56 lbs Knee extension machine Double leg up, Lt leg lowering slowly 3 x 10 20 lbs  Step on over and down 6 inch WB on Lt leg x 20 with single hand assist  Seated SLR slow controlled focus 2 x 10 bilateral  Neuro Re-ed SLS c anterior cone touching 3 cones c x8 bilaterally CGA to min A at times  Tandem stance on foam 1  min x 2 bilateral c occasional hand assist required c SBA  TODAY'S TREATMENT                                                                                 DATE:11/26/2022 Therex: UBE LE only Lvl 3 8 mins full revolutions Leg press Double leg 93 lbs 2X15  Lt Single leg 2 x 15 56 lbs, Rt SL 81# 2X15 Knee extension machine Double leg  up, Lt leg lowering slowly 2 x 10 20 lbs, then left leg only 10# 2X10 Stairs in clinic hall 2 UE support to descend reciprocally, one UE support to ascend reciprocally 2 flights Sit to stands no UE support X 10 reps Seated hamstring stretch 30 sec x 3  Neuro Re-ed Fwd/back fitter board rocker light touching focus c SBA to occasional min A 25x  Standing on foam 6 inch box toe tapping alternating 2 x 10 bilateral c SBA to occasional min A Tandem walk on foam beam and sidestepping on foam beam 3 round trips each with close guarding to min A    PATIENT EDUCATION:  09/23/2022 Education details: HEP, POC Person educated: Patient Education method: Programmer, multimedia, Facilities manager, Verbal cues, and Handouts Education comprehension: verbalized understanding, returned demonstration, and verbal cues required  HOME EXERCISE PROGRAM: Access Code: [email or web address] URL: [email or web address] Date: 09/23/2022 Prepared by: Chyrel Masson  Exercises - Seated Long Arc Quad (Mirrored)  - 3-5 x daily - 7 x weekly - 1-2 sets - 10 reps - 2 hold - Supine Heel Slide with Strap  - 3-5 x daily - 7 x weekly - 1-2 sets - 10 reps - 5 hold - Seated Quad Set (Mirrored)  - 3-5 x daily - 7 x weekly - 1 sets - 10 reps - 5 hold - Supine Knee Extension Mobilization with Weight (Mirrored)  - 3-5 x daily - 7 x weekly - 1 sets - 1 reps - to tolerance up to 15 mins hold  ASSESSMENT:  CLINICAL IMPRESSION: Strength testing showed a bit more improvement towards goals of close to Rt leg strength.  Review HEP for possible discharge following next visit.  Plan to continue review next visit.     OBJECTIVE IMPAIRMENTS: Abnormal gait, decreased activity tolerance, decreased balance, decreased endurance, decreased mobility, difficulty walking, decreased ROM, decreased strength, hypomobility, increased edema, impaired flexibility, improper body mechanics, and pain.   ACTIVITY LIMITATIONS: carrying, lifting, bending, sitting, standing, squatting, sleeping, stairs, transfers, bed mobility, bathing, dressing, and locomotion level  PARTICIPATION LIMITATIONS: meal prep, cleaning, laundry, interpersonal relationship, driving, shopping, and community activity  PERSONAL FACTORS:  CVA with Rt hemiparesis, HTN, neurogenic bladder  are also affecting patient's functional outcome.   REHAB POTENTIAL: Good  CLINICAL DECISION MAKING: Stable/uncomplicated  EVALUATION COMPLEXITY: Low   GOALS: Goals reviewed with patient? Yes  SHORT TERM GOALS: (target date for Short term goals are 3 weeks 10/14/2022)   1.  Patient will demonstrate independent use of home exercise program to maintain progress from in clinic treatments.  Goal status: Met 10/07/2022  LONG TERM GOALS: (target dates for all long term goals are 10 weeks  12/31/22 )   1. Patient will demonstrate/report pain at worst less than or equal to 2/10 to facilitate minimal limitation in daily activity secondary to pain symptoms.  Goal status: on going 11/21/2022   2. Patient will demonstrate independent use of home exercise program to facilitate ability to maintain/progress functional gains from skilled physical therapy services.  Goal status: on going 11/21/2022   3. Patient will demonstrate FOTO outcome > or = 52 % to indicate reduced disability due to condition.  Goal status: MET 11/21/2022   4.  Patient will demonstrate Lt  LE MMT 5/5 throughout to faciltiate usual transfers, stairs, squatting at [facility] for daily life.   Goal status: Met strength 5/5 10/23/2022   5.  Patient will demonstrate Lt knee AROM 0-110 deg to facilitate usual  transfers, ambulation, and stairs at PLOF s limitation.  Goal status: on going 11/21/2022 (met flexion)   6.  Patient will demonstrate independent ambulation community distances > 300 ft to facilitate community ambulation.  Goal status: on going 11/21/2022   7.  Patient will demonstrate ascending/descending stairs c reciprocal gait pattern for household navigation.  Goal Status: on going 11/21/2022   PLAN:  PT FREQUENCY: 1-2x/week  PT DURATION: 8 weeks  PLANNED INTERVENTIONS: Therapeutic exercises, Therapeutic activity, Neuro Muscular re-education, Balance training, Gait training, Patient/Family education, Joint mobilization, Stair training, DME instructions, Dry Needling, Electrical stimulation, Traction, Cryotherapy, vasopneumatic deviceMoist heat, Taping, Ultrasound, Ionotophoresis 4mg /ml Dexamethasone, and Manual therapy.  All included unless contraindicated  PLAN FOR NEXT SESSION:  Prep for HEP transitioning.   Scot Jun, PT, DPT, OCS, ATC 12/05/22  2:20 PM

## 2022-12-09 ENCOUNTER — Ambulatory Visit (INDEPENDENT_AMBULATORY_CARE_PROVIDER_SITE_OTHER): Payer: Medicare PPO | Admitting: Physical Medicine and Rehabilitation

## 2022-12-09 ENCOUNTER — Ambulatory Visit: Payer: Self-pay

## 2022-12-09 DIAGNOSIS — M25551 Pain in right hip: Secondary | ICD-10-CM | POA: Diagnosis not present

## 2022-12-09 MED ORDER — BUPIVACAINE HCL 0.25 % IJ SOLN
4.0000 mL | INTRAMUSCULAR | Status: AC | PRN
  Administered 2022-12-09: 4 mL via INTRA_ARTICULAR

## 2022-12-09 MED ORDER — TRIAMCINOLONE ACETONIDE 40 MG/ML IJ SUSP
40.0000 mg | INTRAMUSCULAR | Status: AC | PRN
  Administered 2022-12-09: 40 mg via INTRA_ARTICULAR

## 2022-12-09 NOTE — Progress Notes (Signed)
Functional Pain Scale - descriptive words and definitions  Distracting (5)    Aware of pain/able to complete some ADL's but limited by pain/sleep is affected and active distractions are only slightly useful. Moderate range order  Average Pain 5   +Driver, -BT, -Dye Allergies.  Walking makes right hip pain worse

## 2022-12-09 NOTE — Progress Notes (Signed)
   Neal Oshea Mintz-Whitcomb - 82 y.o. female MRN [id number]  Date of birth: 16-Aug-1941  Office Visit Note: Visit Date: 12/09/2022 PCP: Isaac Bliss, Rayford Halsted, MD Referred by: Isaac Bliss, Estel*  Subjective: Chief Complaint  Patient presents with   Right Hip - Pain   HPI:  Darien Mignogna Mintz-Whitcomb is a 82 y.o. female who comes in today for planned repeat Right anesthetic hip arthrogram with fluoroscopic guidance.  The patient has failed conservative care including home exercise, medications, time and activity modification. Prior injection gave more than 50% relief for several months. This injection will be diagnostic and hopefully therapeutic.  Please see requesting physician notes for further details and justification.  Referring: Dr. Jean Rosenthal   ROS Otherwise per HPI.  Assessment & Plan: Visit Diagnoses:    ICD-10-CM   1. Pain in right hip  M25.551 Large Joint Inj    XR C-ARM NO REPORT      Plan: No additional findings.   Meds & Orders: No orders of the defined types were placed in this encounter.   Orders Placed This Encounter  Procedures   Large Joint Inj   XR C-ARM NO REPORT    Follow-up: No follow-ups on file.   Procedures: Large Joint Inj on 12/09/2022 1:01 PM Indications: diagnostic evaluation and pain Details: 22 G 3.5 in needle, fluoroscopy-guided anterior approach  Arthrogram: No  Medications: 4 mL bupivacaine 0.25 %; 40 mg triamcinolone acetonide 40 MG/ML Outcome: tolerated well, no immediate complications  There was excellent flow of contrast producing a partial arthrogram of the hip. The patient did have relief of symptoms during the anesthetic phase of the injection. Procedure, treatment alternatives, risks and benefits explained, specific risks discussed. Consent was given by the patient. Immediately prior to procedure a time out was called to verify the correct patient, procedure, equipment, support staff and site/side marked as  required. Patient was prepped and draped in the usual sterile fashion.          Clinical History: No specialty comments available.     Objective:  VS:  HT:    WT:   BMI:     BP:   HR: bpm  TEMP: ( )  RESP:  Physical Exam   Imaging: No results found.

## 2022-12-12 ENCOUNTER — Ambulatory Visit (INDEPENDENT_AMBULATORY_CARE_PROVIDER_SITE_OTHER): Payer: Medicare PPO | Admitting: Rehabilitative and Restorative Service Providers"

## 2022-12-12 ENCOUNTER — Encounter: Payer: Self-pay | Admitting: Rehabilitative and Restorative Service Providers"

## 2022-12-12 DIAGNOSIS — R262 Difficulty in walking, not elsewhere classified: Secondary | ICD-10-CM | POA: Diagnosis not present

## 2022-12-12 DIAGNOSIS — M25562 Pain in left knee: Secondary | ICD-10-CM | POA: Diagnosis not present

## 2022-12-12 DIAGNOSIS — R600 Localized edema: Secondary | ICD-10-CM | POA: Diagnosis not present

## 2022-12-12 DIAGNOSIS — G8929 Other chronic pain: Secondary | ICD-10-CM

## 2022-12-12 DIAGNOSIS — M6281 Muscle weakness (generalized): Secondary | ICD-10-CM

## 2022-12-12 NOTE — Therapy (Signed)
OUTPATIENT PHYSICAL THERAPY TREATMENT /DISCHARGE   Patient Name: Teresa Martinez MRN: [id number] DOB:1941/09/25, 82 y.o., female Today's Date: 12/12/2022   PHYSICAL THERAPY DISCHARGE SUMMARY  Visits from Start of Care: 22  Current functional level related to goals / functional outcomes: See note   Remaining deficits: See note   Education / Equipment: HEP  Patient goals were  mostly met . Patient is being discharged due to being pleased with the current functional level.    END OF SESSION:   PT End of Session - 12/12/22 1418     Visit Number 22    Number of Visits 24    Date for PT Re-Evaluation 12/31/22    Authorization Type [employer] medicare $20 copay    Authorization Time Period -12/31/2021    Authorization - Visit Number 10    Authorization - Number of Visits 12    Progress Note Due on Visit 24    PT Start Time 1417    PT Stop Time 1456    PT Time Calculation (min) 39 min    Activity Tolerance Patient tolerated treatment well    Behavior During Therapy WFL for tasks assessed/performed                  Past Medical History:  Diagnosis Date   Cervical spine degeneration    C5/C6   Chronic right shoulder pain    GERD (gastroesophageal reflux disease)    Hemiparesis affecting dominant side as late effect of cerebrovascular accident (HCC) 12/07/2014   Hyperlipemia    Hypertension    Menopausal syndrome    [name]   Overactive bladder    Statin intolerance    Stroke (HCC) 2015   Past Surgical History:  Procedure Laterality Date   ABDOMINAL HYSTERECTOMY     1985   BAND HEMORRHOIDECTOMY     2010   CATARACT EXTRACTION, BILATERAL     CHOLECYSTECTOMY     1950   LUMBAR LAMINECTOMY Bilateral 08/03/2018   Dr. Christain Sacramento, L3 laminotomy, L4-L5 laminectomy with neural foraminal decompression.   PAROTIDECTOMY     30 years ago   TOTAL KNEE ARTHROPLASTY Left 09/10/2022   Procedure: LEFT TOTAL KNEE ARTHROPLASTY;  Surgeon: Kathryne Hitch, MD;  Location: [facility] OR;  Service: Orthopedics;  Laterality: Left;   Patient Active Problem List   Diagnosis Date Noted   Status post left knee replacement 09/10/2022   IGT (impaired glucose tolerance) 07/27/2021   Preop testing 07/09/2018   Degenerative lumbar spinal stenosis 05/12/2018   Statin myopathy 08/29/2016   Iliotibial band syndrome of right side 04/23/2016   Acromioclavicular arthrosis 06/30/2015   Left carpal tunnel syndrome 05/15/2015   Adhesive capsulitis of right shoulder 02/06/2015   Dysarthria due to cerebrovascular accident 01/09/2015   Hemiparesis affecting dominant side as late effect of cerebrovascular accident (HCC) 12/07/2014   Spastic neurogenic bladder 10/31/2014   Right rotator cuff tendonitis 10/19/2014   Stenosis of cervical spine region    Chronic right shoulder pain    Cerebral infarction due to thrombosis of left middle cerebral artery (HCC)    Left pontine stroke (HCC)    TIA (transient ischemic attack) 10/14/2014   Overactive bladder 10/14/2014   H/O: CVA (cerebrovascular accident) 10/14/2014   NECK PAIN, CHRONIC 10/29/2010   Essential hypertension 10/10/2009   INSOMNIA 09/15/2009   Pure hypercholesterolemia 11/28/2008   MENOPAUSAL SYNDROME 11/28/2008    PCP: Townsend Roger MD  REFERRING PROVIDER: Kathryne Hitch, MD  REFERRING DIAG: [phone] (ICD-10-CM) -  Status post left knee replacement M17.12 (ICD-10-CM) - Unilateral primary osteoarthritis, left knee  THERAPY DIAG:  Chronic pain of left knee  Muscle weakness (generalized)  Difficulty in walking, not elsewhere classified  Localized edema  Rationale for Evaluation and Treatment: Rehabilitation  ONSET DATE: 09/10/2022  SUBJECTIVE:   SUBJECTIVE STATEMENT: Pt indicated no pain , just sore and stiff.    PERTINENT HISTORY: PMH: CVA with Rt hemiparesis, HTN, neurogenic bladder  PAIN:  NPRS scale: "hurts but not a pain" 0/10 upon arrival. Pain  location: Lt knee  Pain description: achy tightness Aggravating factors: walking Relieving factors: avoid pain  PRECAUTIONS: None  WEIGHT BEARING RESTRICTIONS: No  FALLS:  Has patient fallen in last 6 months? 1 fall Do you have a fear of falling?  nope  LIVING ENVIRONMENT: Lives with: primary alone but has children staying at this time.  Lives in: House/apartment Stairs:5 to enter at front, 2 in back with handrail on Lt  Has following equipment at home:FWW, SPC  OCCUPATION: Retired  PLOF: Independent, hobbies - read, exercise routine  PATIENT GOALS: Walking without cane or walker, strengthening.    OBJECTIVE:   PATIENT SURVEYS:  12/12/2022: FOTO update:  55  11/21/2022: FOTO update:  52  11/06/2022  : FOTO update:  51  10/23/2022:  FOTO update:  33  09/23/2022 FOTO intake: 33   predicted:  52  COGNITION: 09/23/2022 Overall cognitive status: WFL    EDEMA:  09/23/2022 Localized edema in Lt knee noted visually.   PALPATION: 09/23/2022 Mild tenderness joint line, posterior knee joint.   LOWER EXTREMITY MMT:   MMT Right 09/23/2022 Left 09/23/2022 Right 10/23/2022 Left 10/23/2022 Left 11/05/2022 Right 11/21/2022 Left 11/21/2022 Left 12/05/2022 Left 12/12/2022  Hip flexion 5/5 4/5         Hip extension           Hip abduction           Hip adduction           Hip internal rotation           Hip external rotation           Knee flexion 5/5 4/5  5/5       Knee extension 5/5 2/5 5/5 38, 39.6 lbs 5/5 35, 37 lbs 5/5 40, 43 lbs 5/5 54.7, 55 lbs 5/5 38, 40 lbs 5/5 43.1, 42 lbs 5/5 51, 53 lbs  Ankle dorsiflexion 5/5 5/5         Ankle plantarflexion           Ankle inversion           Ankle eversion            (Blank rows = not tested)  LOWER EXTREMITY ROM:  ROM Left 09/23/2022 Left 10/01/2022 Left 10/09/2022 Left 10/23/2022 Left 11/05/2022 Left 11/21/2022  Hip flexion        Hip extension        Hip abduction        Hip adduction        Hip internal  rotation        Hip external rotation        Knee flexion 80 AROM in supine heel slide 91 AROM in supine heel slide 105 AROM in supine heel slide 114 AROM in supine heel slide 118 AROM in supine heel slide 123 AROM in supine heel slide  Knee extension -10 AROM in seated LAQ.  -5 in supine quad set AROM  -8 AROM in  quad set in supine -6 AROM in seated LAQ -4 AROM in seated LAQ -4 AROM in seated LAQ  -1 in supine quad set AROM  Ankle dorsiflexion        Ankle plantarflexion        Ankle inversion        Ankle eversion         (Blank rows = not tested)   FUNCTIONAL TESTS:  12/12/2022 :  5 x sit to stand 9.41 seconds s UE assist   11/14/2022:  5 x sit to stand 18 inch chair 18.23 seconds no UE assist.   09/23/2022 18 inch chair transfer: unable without UE assist Lt SLS: unable unassisted   GAIT: 11/13/2022:  Able to perform independent ambulation in clinic with Rt leg limitation (foot drop) noted more than any Lt leg.  SPC use into and out of clinic.   10/23/2022:  Mod independent c SPC.  Times of Rt foot toe drag in swing.  Lt knee Lacking end range in stance  10/16/2022:  Mod independent c SPC within clinic  10/07/2022:  SPC use in clinic c supervision  09/23/2022 FWW ambulation c reduced stance on Lt leg, maintained knee flexion in stance.   TODAY'S TREATMENT                                                                                 DATE:12/12/2022 Therex: UBE LE only Lvl 3.0 9 mins full revolutions Verbal review of existing HEP c handout for knowledge improvements for HEP discharge.  Leg press Double leg 106 lbs 2X15  Lt Single leg 2 x 15 56 lbs Knee extension machine Double leg up, Lt leg lowering slowly 3 x 10 20 lbs  Knee flexion machine Lt leg only 2 x 10 20 lbs  Incline gastroc stretch 30 sec x 3 bilateral Standing alternating heel/toe lift x 15 each way Tandem stance 1 min x 1 bilateral c occasional HHA   TODAY'S TREATMENT                                                                                  DATE:12/05/2022 Therex: UBE LE only Lvl 3.0 9 mins full revolutions Leg press Double leg 106 lbs 2X15  Lt Single leg 2 x 15 56 lbs Knee extension machine Double leg up, Lt leg lowering slowly 3 x 10 20 lbs   Neuro Re-ed Tandem stance on ground x 1 min bilateral in // bars occasional HHA Fitter rocker board fwd/back 25 each with CGA to min A throughout to prevent loss of balance.   TherActivity Ascending/descending full flight of stairs in clinic with single hand rail assist and supervision  TODAY'S TREATMENT  DATE:11/28/2022 Therex: UBE LE only Lvl 3.5 8 mins full revolutions Leg press Double leg 93 lbs 2X15  Lt Single leg 2 x 15 56 lbs Knee extension machine Double leg up, Lt leg lowering slowly 3 x 10 20 lbs  Step on over and down 6 inch WB on Lt leg x 20 with single hand assist  Seated SLR slow controlled focus 2 x 10 bilateral  Neuro Re-ed SLS c anterior cone touching 3 cones c x8 bilaterally CGA to min A at times  Tandem stance on foam 1 min x 2 bilateral c occasional hand assist required c SBA   PATIENT EDUCATION:  12/12/2022  Education details: HEP update Person educated: Patient Education method: Consulting civil engineer, Demonstration, Verbal cues, and Handouts Education comprehension: verbalized understanding, returned demonstration, and verbal cues required  HOME EXERCISE PROGRAM: Access Code: [email or web address] URL: [email or web address] Date: 12/12/2022 Prepared by: Scot Jun  Exercises - Seated Long Arc Quad (Mirrored)  - 7 x weekly - 1-2 sets - 10 reps - 2 hold - Supine Heel Slide with Strap  - 1-2 x daily - 7 x weekly - 1-2 sets - 10 reps - 5 hold - Seated Quad Set (Mirrored)  - 3-5 x daily - 7 x weekly - 1 sets - 10 reps - 5 hold - Supine Knee Extension Mobilization with Weight (Mirrored)  - 3-5 x daily - 7 x weekly - 1 sets - 1 reps - to tolerance  up to 15 mins hold - Seated Straight Leg Heel Taps  - 1-2 x daily - 7 x weekly - 3 sets - 10 reps - Sit to Stand  - 3 x daily - 7 x weekly - 1 sets - 10 reps - Tandem Stance  - 1 x daily - 7 x weekly - 1 sets - 3-5 reps - 30 hold - Heel Toe Raises with Counter Support  - 1 x daily - 7 x weekly - 1-2 sets - 10 reps  ASSESSMENT:  CLINICAL IMPRESSION: Pt attended 22 visits overall with good progress noted.  See objective data and goal assessment for most recent presentation.  Recommend discharge to HEP at this time due to improvement.  Pt was in agreement.    OBJECTIVE IMPAIRMENTS: Abnormal gait, decreased activity tolerance, decreased balance, decreased endurance, decreased mobility, difficulty walking, decreased ROM, decreased strength, hypomobility, increased edema, impaired flexibility, improper body mechanics, and pain.   ACTIVITY LIMITATIONS: carrying, lifting, bending, sitting, standing, squatting, sleeping, stairs, transfers, bed mobility, bathing, dressing, and locomotion level  PARTICIPATION LIMITATIONS: meal prep, cleaning, laundry, interpersonal relationship, driving, shopping, and community activity  PERSONAL FACTORS:  CVA with Rt hemiparesis, HTN, neurogenic bladder  are also affecting patient's functional outcome.   REHAB POTENTIAL: Good  CLINICAL DECISION MAKING: Stable/uncomplicated  EVALUATION COMPLEXITY: Low   GOALS: Goals reviewed with patient? Yes  SHORT TERM GOALS: (target date for Short term goals are 3 weeks 10/14/2022)   1.  Patient will demonstrate independent use of home exercise program to maintain progress from in clinic treatments.  Goal status: Met 10/07/2022  LONG TERM GOALS: (target dates for all long term goals are 10 weeks  12/31/22 )   1. Patient will demonstrate/report pain at worst less than or equal to 2/10 to facilitate minimal limitation in daily activity secondary to pain symptoms.  Goal status: Met 12/12/2022   2. Patient will  demonstrate independent use of home exercise program to facilitate ability to maintain/progress functional gains from skilled physical therapy services.  Goal status: Met  12/12/2022   3. Patient will demonstrate FOTO outcome > or = 52 % to indicate reduced disability due to condition.  Goal status: Met 12/12/2022   4.  Patient will demonstrate Lt  LE MMT 5/5 throughout to faciltiate usual transfers, stairs, squatting at [facility] for daily life.   Goal status: Met 12/12/2022   5.  Patient will demonstrate Lt knee AROM 0-110 deg to facilitate usual transfers, ambulation, and stairs at PLOF s limitation.  Goal status: Met 12/12/2022   6.  Patient will demonstrate independent ambulation community distances > 300 ft to facilitate community ambulation.  Goal status: Mostly Met 12/12/2022   7.  Patient will demonstrate ascending/descending stairs c reciprocal gait pattern for household navigation.  Goal Status: Met 12/12/2022   PLAN:  PT FREQUENCY:   PT DURATION:   PLANNED INTERVENTIONS: Therapeutic exercises, Therapeutic activity, Neuro Muscular re-education, Balance training, Gait training, Patient/Family education, Joint mobilization, Stair training, DME instructions, Dry Needling, Electrical stimulation, Traction, Cryotherapy, vasopneumatic deviceMoist heat, Taping, Ultrasound, Ionotophoresis 4mg /ml Dexamethasone, and Manual therapy.  All included unless contraindicated  PLAN FOR NEXT SESSION:  Discharge to HEP.   Scot Jun, PT, DPT, OCS, ATC 12/12/22  2:57 PM

## 2022-12-25 ENCOUNTER — Encounter: Payer: Self-pay | Admitting: Internal Medicine

## 2022-12-25 ENCOUNTER — Ambulatory Visit: Payer: Medicare PPO | Admitting: Internal Medicine

## 2022-12-25 VITALS — BP 130/78 | HR 109 | Temp 98.7°F | Wt 137.2 lb

## 2022-12-25 DIAGNOSIS — R590 Localized enlarged lymph nodes: Secondary | ICD-10-CM | POA: Diagnosis not present

## 2022-12-25 DIAGNOSIS — R1319 Other dysphagia: Secondary | ICD-10-CM | POA: Diagnosis not present

## 2022-12-25 DIAGNOSIS — K219 Gastro-esophageal reflux disease without esophagitis: Secondary | ICD-10-CM

## 2022-12-25 NOTE — Progress Notes (Signed)
Established Patient Office Visit     CC/Reason for Visit: Discuss acute concerns  HPI: Teresa Martinez is a 82 y.o. female who is coming in today for the above mentioned reasons.  She is here today for 2 issues:  1.  Yesterday she noted a lump in her right upper neck area.  It was very painful.  She applied some warm compresses and this morning it has already significantly decreased in size.  A week ago she had some URI symptoms.  2.  She has a long-term history of GERD.  She came off her pantoprazole a few months ago.  She has noticed increasing reflux symptoms.  She also has been having some dysphagia.  Last week and, while at a party she regurgitated a large amount of food and ever since then her dysphagia has completely resolved.   Past Medical/Surgical History: Past Medical History:  Diagnosis Date   Cervical spine degeneration    C5/C6   Chronic right shoulder pain    GERD (gastroesophageal reflux disease)    Hemiparesis affecting dominant side as late effect of cerebrovascular accident ([place]) 12/07/2014   Hyperlipemia    Hypertension    Menopausal syndrome    [name]   Overactive bladder    Statin intolerance    Stroke [facility]) 2015    Past Surgical History:  Procedure Laterality Date   ABDOMINAL HYSTERECTOMY     1985   BAND HEMORRHOIDECTOMY     2010   CATARACT EXTRACTION, BILATERAL     CHOLECYSTECTOMY     1950   LUMBAR LAMINECTOMY Bilateral 08/03/2018   Dr. Octavio Manns, L3 laminotomy, L4-L5 laminectomy with neural foraminal decompression.   PAROTIDECTOMY     30 years ago   TOTAL KNEE ARTHROPLASTY Left 09/10/2022   Procedure: LEFT TOTAL KNEE ARTHROPLASTY;  Surgeon: Mcarthur Rossetti, MD;  Location: [facility];  Service: Orthopedics;  Laterality: Left;    Social History:  reports that she has never smoked. She has never used smokeless tobacco. She reports that she does not drink alcohol and does not use drugs.  Allergies: Allergies   Allergen Reactions   Statins Other (See Comments)    Muscle pain and unable to walk Muscle pain and unable to walk   Wasp Venom    Latex Other (See Comments)    Burns her skin    Family History:  Family History  Problem Relation Age of Onset   Heart failure Father    Diabetes Mother    Hypertension Mother    Stroke Mother    Cirrhosis Sister    Diabetes Sister    Hyperlipidemia Brother    Hypertension Brother    Stroke Brother    Diabetes Brother      Current Outpatient Medications:    aspirin 81 MG chewable tablet, Chew 1 tablet (81 mg total) by mouth 2 (two) times daily., Disp: 30 tablet, Rfl: 0   Calcium Carbonate-Vit D-Min (CALTRATE PLUS PO), Take 1 tablet by mouth daily., Disp: , Rfl:    ezetimibe (ZETIA) 10 MG tablet, TAKE 1 TABLET(10 MG) BY MOUTH DAILY, Disp: 90 tablet, Rfl: 1   fesoterodine (TOVIAZ) 4 MG TB24 tablet, TAKE 1 TABLET(4 MG) BY MOUTH DAILY, Disp: 90 tablet, Rfl: 1   lisinopril (ZESTRIL) 10 MG tablet, TAKE 1 TABLET(10 MG) BY MOUTH DAILY, Disp: 90 tablet, Rfl: 1   methocarbamol (ROBAXIN) 500 MG tablet, Take 1 tablet (500 mg total) by mouth every 6 (six) hours as needed., Disp:  40 tablet, Rfl: 1   Multiple Vitamin (MULTIVITAMIN WITH MINERALS) TABS tablet, Take 1 tablet by mouth daily., Disp: , Rfl:    pantoprazole (PROTONIX) 40 MG tablet, TAKE 1 TABLET(40 MG) BY MOUTH DAILY, Disp: 90 tablet, Rfl: 1   REPATHA SURECLICK 371 MG/ML SOAJ, INJECT 1 DOSE INTO SKIN EVERY 14(FOURTEEN) DAYS, Disp: 2 mL, Rfl: 11   traMADol (ULTRAM) 50 MG tablet, Take 1 tablet (50 mg total) by mouth every 6 (six) hours as needed for severe pain., Disp: 30 tablet, Rfl: 0  Review of Systems:  Negative unless indicated in HPI.   Physical Exam: Vitals:   12/25/22 1059 12/25/22 1102 12/25/22 1125  BP: (!) 160/110 (!) 160/92 130/78  Pulse: (!) 109    Temp: 98.7 F (37.1 C)    TempSrc: Oral    SpO2: 99%    Weight: 137 lb 3.2 oz (62.2 kg)      Body mass index is 22.14  kg/m.   Physical Exam Vitals reviewed.  Constitutional:      Appearance: Normal appearance.  HENT:     Head: Normocephalic and atraumatic.  Eyes:     Conjunctiva/sclera: Conjunctivae normal.     Pupils: Pupils are equal, round, and reactive to light.  Neck:   Cardiovascular:     Rate and Rhythm: Normal rate and regular rhythm.  Pulmonary:     Effort: Pulmonary effort is normal.     Breath sounds: Normal breath sounds.  Lymphadenopathy:     Cervical: Cervical adenopathy present.     Right cervical: Superficial cervical adenopathy present.  Skin:    General: Skin is warm and dry.  Neurological:     General: No focal deficit present.     Mental Status: She is alert and oriented to person, place, and time.  Psychiatric:        Mood and Affect: Mood normal.        Behavior: Behavior normal.        Thought Content: Thought content normal.        Judgment: Judgment normal.      Impression and Plan:  Gastroesophageal reflux disease, unspecified whether esophagitis present  Esophageal dysphagia  Cervical lymphadenopathy  -Suspect right cervical lymphadenopathy related to recent URI symptoms.  Observation only for now. -Suspect her dysphagia and increased reflux related to discontinuing pantoprazole and probably a food bolus impaction that seems to have resolved.  Has had no recurring symptoms since resuming.  If symptoms recur, I think GI evaluation versus barium esophagram would be indicated.  Time spent:32 minutes reviewing chart, interviewing and examining patient and formulating plan of care.     Lelon Frohlich, MD [facility] at [facility]

## 2023-01-15 ENCOUNTER — Ambulatory Visit
Admission: RE | Admit: 2023-01-15 | Discharge: 2023-01-15 | Disposition: A | Discharge status: Home / Self Care | Payer: Medicare PPO | Source: Ambulatory Visit | Attending: Internal Medicine | Admitting: Internal Medicine

## 2023-01-15 DIAGNOSIS — M85852 Other specified disorders of bone density and structure, left thigh: Secondary | ICD-10-CM | POA: Diagnosis not present

## 2023-01-15 DIAGNOSIS — Z13820 Encounter for screening for osteoporosis: Secondary | ICD-10-CM

## 2023-01-15 DIAGNOSIS — Z780 Asymptomatic menopausal state: Secondary | ICD-10-CM | POA: Diagnosis not present

## 2023-01-17 DIAGNOSIS — E7841 Elevated Lipoprotein(a): Secondary | ICD-10-CM | POA: Diagnosis not present

## 2023-01-17 DIAGNOSIS — E7849 Other hyperlipidemia: Secondary | ICD-10-CM | POA: Diagnosis not present

## 2023-01-18 LAB — LIPID PANEL
Chol/HDL Ratio: 2.9 ratio (ref 0.0–4.4)
Cholesterol, Total: 173 mg/dL (ref 100–199)
HDL: 59 mg/dL (ref >39)
LDL Chol Calc (NIH): 102 mg/dL — ABNORMAL HIGH (ref 0–99)
Triglycerides: 60 mg/dL (ref 0–149)
VLDL Cholesterol Cal: 12 mg/dL (ref 5–40)

## 2023-01-22 DIAGNOSIS — Z124 Encounter for screening for malignant neoplasm of cervix: Secondary | ICD-10-CM | POA: Diagnosis not present

## 2023-01-22 DIAGNOSIS — Z01419 Encounter for gynecological examination (general) (routine) without abnormal findings: Secondary | ICD-10-CM | POA: Diagnosis not present

## 2023-01-22 DIAGNOSIS — M8580 Other specified disorders of bone density and structure, unspecified site: Secondary | ICD-10-CM | POA: Diagnosis not present

## 2023-01-22 DIAGNOSIS — Z90711 Acquired absence of uterus with remaining cervical stump: Secondary | ICD-10-CM | POA: Diagnosis not present

## 2023-01-22 DIAGNOSIS — N952 Postmenopausal atrophic vaginitis: Secondary | ICD-10-CM | POA: Diagnosis not present

## 2023-01-22 DIAGNOSIS — Z6822 Body mass index (BMI) 22.0-22.9, adult: Secondary | ICD-10-CM | POA: Diagnosis not present

## 2023-01-22 DIAGNOSIS — Z1231 Encounter for screening mammogram for malignant neoplasm of breast: Secondary | ICD-10-CM | POA: Diagnosis not present

## 2023-01-22 DIAGNOSIS — N3941 Urge incontinence: Secondary | ICD-10-CM | POA: Diagnosis not present

## 2023-01-22 DIAGNOSIS — Z01411 Encounter for gynecological examination (general) (routine) with abnormal findings: Secondary | ICD-10-CM | POA: Diagnosis not present

## 2023-01-23 ENCOUNTER — Encounter: Payer: Self-pay | Admitting: Radiology

## 2023-01-23 LAB — HM MAMMOGRAPHY

## 2023-01-24 ENCOUNTER — Encounter (HOSPITAL_BASED_OUTPATIENT_CLINIC_OR_DEPARTMENT_OTHER): Payer: Self-pay | Admitting: Internal Medicine

## 2023-01-24 ENCOUNTER — Ambulatory Visit (HOSPITAL_BASED_OUTPATIENT_CLINIC_OR_DEPARTMENT_OTHER): Payer: Medicare PPO | Admitting: Internal Medicine

## 2023-01-24 VITALS — BP 150/90 | HR 81 | Ht 66.0 in | Wt 138.0 lb

## 2023-01-24 DIAGNOSIS — E7849 Other hyperlipidemia: Secondary | ICD-10-CM | POA: Diagnosis not present

## 2023-01-24 DIAGNOSIS — E7841 Elevated Lipoprotein(a): Secondary | ICD-10-CM

## 2023-01-24 DIAGNOSIS — T466X5A Adverse effect of antihyperlipidemic and antiarteriosclerotic drugs, initial encounter: Secondary | ICD-10-CM

## 2023-01-24 DIAGNOSIS — M791 Myalgia, unspecified site: Secondary | ICD-10-CM | POA: Diagnosis not present

## 2023-01-24 DIAGNOSIS — T466X5D Adverse effect of antihyperlipidemic and antiarteriosclerotic drugs, subsequent encounter: Secondary | ICD-10-CM | POA: Diagnosis not present

## 2023-01-24 DIAGNOSIS — Z8673 Personal history of transient ischemic attack (TIA), and cerebral infarction without residual deficits: Secondary | ICD-10-CM

## 2023-01-24 NOTE — Patient Instructions (Signed)
Medication Instructions:  Continue current medication   *If you need a refill on your cardiac medications before your next appointment, please call your pharmacy*   Lab Work: None Ordered   Testing/Procedures: None Ordered   Follow-Up: At [facility], you and your health needs are our priority.  As part of our continuing mission to provide you with exceptional heart care, we have created designated Provider Care Teams.  These Care Teams include your primary Cardiologist (physician) and Advanced Practice Providers (APPs -  Physician Assistants and Nurse Practitioners) who all work together to provide you with the care you need, when you need it.  We recommend signing up for the patient portal called "MyChart".  Sign up information is provided on this After Visit Summary.  MyChart is used to connect with patients for Virtual Visits (Telemedicine).  Patients are able to view lab/test results, encounter notes, upcoming appointments, etc.  Non-urgent messages can be sent to your provider as well.   To learn more about what you can do with MyChart, go to [email or web address].    Your next appointment:   1 year(s)  Provider:   K. Mali Hilty, MD    Other Instructions

## 2023-01-24 NOTE — Progress Notes (Signed)
LIPID CLINIC CONSULT NOTE  Chief Complaint:  Follow-up dyslipidemia  Primary Care Physician: Isaac Bliss, Rayford Halsted, MD  Primary Cardiologist:  None  HPI:  Teresa Martinez is a 82 y.o. female who is being seen today for the evaluation of dyslipidemia at the request of Isaac Bliss, [place] [facility]*.  This is a pleasant 82 year old female kindly referred for evaluation management of dyslipidemia.  Interestingly she has been very athletic most of her life in fact competing in marathon races even into her 28s.  Unfortunately she had a stroke in 2016 and since then had some mediate issues with hemiparesis but says she has never been quite the same.  She also has a history of hypertension, dyslipidemia, mild bilateral carotid disease and some cerebrovascular atherosclerosis based on CT imaging.  She has been intolerant to statins and reports a strong family history of high cholesterol in both of her twin daughters, her aunt, her brother and other family members.  Initial blood pressure was 162/94 however she said it is typically running around 130/78.  Lipids were reassessed in March showing total cholesterol 244, HDL 55, HDL 172 and triglycerides 86.  She has been on ezetimibe but she says it does not seem to be effective for her.  Previously she had taken statins which caused her severe side effects.  She had tried 3 different statins with her prior primary care provider to cause significant inability to walk and she said she would not ever take them again.  08/23/2021  Teresa Martinez returns today for follow-up.  She is done well on Repatha.  She seems to be tolerating however does have some injection site redness.  She said initially after the first several doses it was not an issue however she has now developed some that may last up to about a week.  She says it is itchy and tender but tolerable.  Her cholesterol though has responded nicely.  Her total cholesterol is now 168, HDL 61,  LDL 89 and triglycerides 98.  Her LDL has come down from 172.  Particle number was [id number].  Her LP(a) is elevated and probably was implicated in her stroke.  I suspect this number is now about 20 to 30% lower than it was previously, at 235.  Although she had history of stroke, according to the criteria for the [employer] study for a specific LP(a) lowering medication, she would not qualify because she would have had to have had prior PCI and history of stroke.  01/24/2023  Teresa Martinez returns today for follow-up.  She recently had knee surgery and has had some pain associated with that.  She also lost her husband recently and her brother.  She has been having a lot of stress.  Blood pressure was a little elevated today however came down to 150/90.  This is being monitored by her PCP.  She feels like stress is a factor.  Cholesterol has crept up slightly but still remains reasonably well treated.  Total 173, triglycerides 60, HDL 59 and LDL 102.  She is on Repatha and ezetimibe and reports compliance with medications.  PMHx:  Past Medical History:  Diagnosis Date   Cervical spine degeneration    C5/C6   Chronic right shoulder pain    GERD (gastroesophageal reflux disease)    Hemiparesis affecting dominant side as late effect of cerebrovascular accident ([place]) 12/07/2014   Hyperlipemia    Hypertension    Menopausal syndrome    Pamelia Hoit)   Overactive bladder  Statin intolerance    Stroke [facility]) 2015    Past Surgical History:  Procedure Laterality Date   ABDOMINAL HYSTERECTOMY     1985   BAND HEMORRHOIDECTOMY     2010   CATARACT EXTRACTION, BILATERAL     CHOLECYSTECTOMY     1950   LUMBAR LAMINECTOMY Bilateral 08/03/2018   Dr. Octavio Manns, L3 laminotomy, L4-L5 laminectomy with neural foraminal decompression.   PAROTIDECTOMY     30 years ago   TOTAL KNEE ARTHROPLASTY Left 09/10/2022   Procedure: LEFT TOTAL KNEE ARTHROPLASTY;  Surgeon: Mcarthur Rossetti, MD;  Location: [facility];   Service: Orthopedics;  Laterality: Left;    FAMHx:  Family History  Problem Relation Age of Onset   Heart failure Father    Diabetes Mother    Hypertension Mother    Stroke Mother    Cirrhosis Sister    Diabetes Sister    Hyperlipidemia Brother    Hypertension Brother    Stroke Brother    Diabetes Brother     SOCHx:   reports that she has never smoked. She has never used smokeless tobacco. She reports that she does not drink alcohol and does not use drugs.  ALLERGIES:  Allergies  Allergen Reactions   Oxycodone Nausea And Vomiting   Statins Other (See Comments)    Muscle pain and unable to walk Muscle pain and unable to walk   Wasp Venom    Latex Other (See Comments)    Burns her skin    ROS: Pertinent items noted in HPI and remainder of comprehensive ROS otherwise negative.  HOME MEDS: Current Outpatient Medications on File Prior to Visit  Medication Sig Dispense Refill   aspirin 81 MG chewable tablet Chew 1 tablet (81 mg total) by mouth 2 (two) times daily. 30 tablet 0   Calcium Carbonate-Vit D-Min (CALTRATE PLUS PO) Take 1 tablet by mouth daily.     ezetimibe (ZETIA) 10 MG tablet TAKE 1 TABLET(10 MG) BY MOUTH DAILY 90 tablet 1   fesoterodine (TOVIAZ) 4 MG TB24 tablet TAKE 1 TABLET(4 MG) BY MOUTH DAILY 90 tablet 1   lisinopril (ZESTRIL) 10 MG tablet TAKE 1 TABLET(10 MG) BY MOUTH DAILY 90 tablet 1   Multiple Vitamin (MULTIVITAMIN WITH MINERALS) TABS tablet Take 1 tablet by mouth daily.     pantoprazole (PROTONIX) 40 MG tablet TAKE 1 TABLET(40 MG) BY MOUTH DAILY 90 tablet 1   REPATHA SURECLICK XX123456 MG/ML SOAJ INJECT 1 DOSE INTO SKIN EVERY 14(FOURTEEN) DAYS 2 mL 11   No current facility-administered medications on file prior to visit.    LABS/IMAGING: No results found for this or any previous visit (from the past 48 hour(s)). No results found.  LIPID PANEL:    Component Value Date/Time   CHOL 173 01/17/2023 0921   TRIG 60 01/17/2023 0921   HDL 59 01/17/2023  0921   CHOLHDL 2.9 01/17/2023 0921   CHOLHDL 3 07/30/2022 0908   VLDL 22.0 07/30/2022 0908   LDLCALC 102 (H) 01/17/2023 0921   LDLCALC 142 (H) 07/26/2020 1009   LDLDIRECT 205.1 07/30/2013 0946    WEIGHTS: Wt Readings from Last 3 Encounters:  01/24/23 138 lb (62.6 kg)  12/25/22 137 lb 3.2 oz (62.2 kg)  10/29/22 138 lb 6.4 oz (62.8 kg)    VITALS: BP (!) 172/85   Pulse 81   Ht '5\' 6"'$  (1.676 m)   Wt 138 lb (62.6 kg)   BMI 22.27 kg/m   EXAM: Deferred  EKG: Deferred  ASSESSMENT: Probable  familial hyperlipidemia History of stroke (2016) Mild bilateral carotid disease-ultrasound in 2015 Hypertension Family history of high cholesterol and multiple family members and twin daughters Elevated LP(a)-235  PLAN: 1.   Ms. Martinez has had reasonably stable cholesterol with a small increase recently.  This may be due to less activity as she had recent knee replacement.  Hopefully she will be able to become more active and will see this number come down somewhat.  She did have a high LP(a) but is already on Repatha and ezetimibe.  Although she has had a prior stroke, we would not able to enroll her in the LP(a) specific lowering trial.  Will have to consider her for possible clinical trials in the future.  Plan follow-up annually or sooner as necessary.  Pixie Casino, MD, [facility] Director of the Advanced Lipid Disorders &  Cardiovascular Risk Reduction Clinic Diplomate of the American Board of Clinical Lipidology Attending Cardiologist  Direct Dial: [phone]  Fax: [phone]  Website:  [email or web address] 01/24/2023, 3:04 PM

## 2023-02-24 ENCOUNTER — Encounter: Payer: Self-pay | Admitting: Orthopaedic Surgery

## 2023-02-24 ENCOUNTER — Other Ambulatory Visit (INDEPENDENT_AMBULATORY_CARE_PROVIDER_SITE_OTHER): Payer: Medicare PPO

## 2023-02-24 ENCOUNTER — Ambulatory Visit: Payer: Medicare PPO | Admitting: Orthopaedic Surgery

## 2023-02-24 DIAGNOSIS — Z96652 Presence of left artificial knee joint: Secondary | ICD-10-CM

## 2023-02-24 NOTE — Progress Notes (Signed)
The patient is an 82 year old female who is now 6 months status post a left total knee replacement.  She is doing well overall with the knee.  Most of her complaints are right-sided low back pain.  She has had spinal decompression surgery without instrumentation in [place] sometime in the last few years.  On exam her left knee shows minimal swelling.  Her left knee has good range of motion and is ligamentously stable.  2 views left knee show well-seated total knee arthroplasty with no complicating features.  From a knee standpoint, follow-up can be as needed.  She does have some mild arthritis in her right hip but she has no groin pain and her hip moves smoothly and easily.  All of her pain seems to be in the right side of her low back.  She understands that she needs to see her spine surgeon for this.

## 2023-02-27 ENCOUNTER — Encounter: Payer: Self-pay | Admitting: Internal Medicine

## 2023-02-27 ENCOUNTER — Ambulatory Visit: Payer: Medicare PPO | Admitting: Internal Medicine

## 2023-02-27 VITALS — BP 158/92 | HR 82 | Temp 98.2°F | Wt 134.5 lb

## 2023-02-27 DIAGNOSIS — E7800 Pure hypercholesterolemia, unspecified: Secondary | ICD-10-CM | POA: Diagnosis not present

## 2023-02-27 DIAGNOSIS — I10 Essential (primary) hypertension: Secondary | ICD-10-CM | POA: Diagnosis not present

## 2023-02-27 DIAGNOSIS — R7302 Impaired glucose tolerance (oral): Secondary | ICD-10-CM | POA: Diagnosis not present

## 2023-02-27 DIAGNOSIS — Z8673 Personal history of transient ischemic attack (TIA), and cerebral infarction without residual deficits: Secondary | ICD-10-CM | POA: Diagnosis not present

## 2023-02-27 LAB — POCT GLYCOSYLATED HEMOGLOBIN (HGB A1C): Hemoglobin A1C: 6 % — AB (ref 4.0–5.6)

## 2023-02-27 MED ORDER — LISINOPRIL 20 MG PO TABS: 20.0000 mg | ORAL_TABLET | Freq: Every day | ORAL | 1 refills | Status: DC

## 2023-02-27 NOTE — Progress Notes (Signed)
Established Patient Office Visit     CC/Reason for Visit: Follow-up chronic conditions  HPI: Teresa Martinez is a 82 y.o. female who is coming in today for the above mentioned reasons. Past Medical History is significant for: Hypertension, hyperlipidemia impaired glucose tolerance.  She has been feeling well.  She has been checking her blood pressure at home as it remains elevated.  Average seems to be in the 140s over high 70s to 80s.   Past Medical/Surgical History: Past Medical History:  Diagnosis Date   Cervical spine degeneration    C5/C6   Chronic right shoulder pain    GERD (gastroesophageal reflux disease)    Hemiparesis affecting dominant side as late effect of cerebrovascular accident 12/07/2014   Hyperlipemia    Hypertension    Menopausal syndrome    [name]   Overactive bladder    Statin intolerance    Stroke 2015    Past Surgical History:  Procedure Laterality Date   ABDOMINAL HYSTERECTOMY     1985   BAND HEMORRHOIDECTOMY     2010   CATARACT EXTRACTION, BILATERAL     CHOLECYSTECTOMY     1950   LUMBAR LAMINECTOMY Bilateral 08/03/2018   Dr. Christain Sacramento, L3 laminotomy, L4-L5 laminectomy with neural foraminal decompression.   PAROTIDECTOMY     30 years ago   TOTAL KNEE ARTHROPLASTY Left 09/10/2022   Procedure: LEFT TOTAL KNEE ARTHROPLASTY;  Surgeon: Kathryne Hitch, MD;  Location: [facility];  Service: Orthopedics;  Laterality: Left;    Social History:  reports that she has never smoked. She has never used smokeless tobacco. She reports that she does not drink alcohol and does not use drugs.  Allergies: Allergies  Allergen Reactions   Oxycodone Nausea And Vomiting   Statins Other (See Comments)    Muscle pain and unable to walk Muscle pain and unable to walk   Wasp Venom    Latex Other (See Comments)    Burns her skin    Family History:  Family History  Problem Relation Age of Onset   Heart failure Father    Diabetes  Mother    Hypertension Mother    Stroke Mother    Cirrhosis Sister    Diabetes Sister    Hyperlipidemia Brother    Hypertension Brother    Stroke Brother    Diabetes Brother      Current Outpatient Medications:    aspirin 81 MG chewable tablet, Chew 1 tablet (81 mg total) by mouth 2 (two) times daily., Disp: 30 tablet, Rfl: 0   Calcium Carbonate-Vit D-Min (CALTRATE PLUS PO), Take 1 tablet by mouth daily., Disp: , Rfl:    ezetimibe (ZETIA) 10 MG tablet, TAKE 1 TABLET(10 MG) BY MOUTH DAILY, Disp: 90 tablet, Rfl: 1   fesoterodine (TOVIAZ) 4 MG TB24 tablet, TAKE 1 TABLET(4 MG) BY MOUTH DAILY, Disp: 90 tablet, Rfl: 1   Multiple Vitamin (MULTIVITAMIN WITH MINERALS) TABS tablet, Take 1 tablet by mouth daily., Disp: , Rfl:    pantoprazole (PROTONIX) 40 MG tablet, TAKE 1 TABLET(40 MG) BY MOUTH DAILY, Disp: 90 tablet, Rfl: 1   REPATHA SURECLICK 140 MG/ML SOAJ, INJECT 1 DOSE INTO SKIN EVERY 14(FOURTEEN) DAYS, Disp: 2 mL, Rfl: 11   lisinopril (ZESTRIL) 20 MG tablet, Take 1 tablet (20 mg total) by mouth daily., Disp: 90 tablet, Rfl: 1  Review of Systems:  Negative unless indicated in HPI.   Physical Exam: Vitals:   02/27/23 1508 02/27/23 1513  BP: (!) 151/91 Marland Kitchen)  158/92  Pulse: 82   Temp: 98.2 F (36.8 C)   TempSrc: Oral   SpO2: 95%   Weight: 134 lb 8 oz (61 kg)     Body mass index is 21.71 kg/m.   Physical Exam Vitals reviewed.  Constitutional:      Appearance: Normal appearance.  HENT:     Head: Normocephalic and atraumatic.  Eyes:     Conjunctiva/sclera: Conjunctivae normal.     Pupils: Pupils are equal, round, and reactive to light.  Cardiovascular:     Rate and Rhythm: Normal rate and regular rhythm.  Pulmonary:     Effort: Pulmonary effort is normal.     Breath sounds: Normal breath sounds.  Skin:    General: Skin is warm and dry.  Neurological:     General: No focal deficit present.     Mental Status: She is alert and oriented to person, place, and time.   Psychiatric:        Mood and Affect: Mood normal.        Behavior: Behavior normal.        Thought Content: Thought content normal.        Judgment: Judgment normal.      Impression and Plan:  IGT (impaired glucose tolerance) - Plan: POC HgB A1c  Pure hypercholesterolemia  Essential hypertension - Plan: lisinopril (ZESTRIL) 20 MG tablet  H/O: CVA (cerebrovascular accident)  -Blood pressure is not well-controlled.  Increase lisinopril from 10 to 20 mg.  She will continue ambulatory blood pressure measurements and return in 3 months for follow-up. -Hypercholesterolemia is followed by Dr. Rennis Golden, currently on ezetimibe and evolocumab. -A1c is stable at 6.0.  Time spent:31 minutes reviewing chart, interviewing and examining patient and formulating plan of care.     Chaya Jan, MD [facility] at [facility]

## 2023-03-31 DIAGNOSIS — M4726 Other spondylosis with radiculopathy, lumbar region: Secondary | ICD-10-CM | POA: Diagnosis not present

## 2023-03-31 DIAGNOSIS — M5117 Intervertebral disc disorders with radiculopathy, lumbosacral region: Secondary | ICD-10-CM | POA: Diagnosis not present

## 2023-03-31 DIAGNOSIS — M4727 Other spondylosis with radiculopathy, lumbosacral region: Secondary | ICD-10-CM | POA: Diagnosis not present

## 2023-03-31 DIAGNOSIS — S32048A Other fracture of fourth lumbar vertebra, initial encounter for closed fracture: Secondary | ICD-10-CM | POA: Diagnosis not present

## 2023-03-31 DIAGNOSIS — M461 Sacroiliitis, not elsewhere classified: Secondary | ICD-10-CM | POA: Diagnosis not present

## 2023-03-31 DIAGNOSIS — G8929 Other chronic pain: Secondary | ICD-10-CM | POA: Diagnosis not present

## 2023-03-31 DIAGNOSIS — M47816 Spondylosis without myelopathy or radiculopathy, lumbar region: Secondary | ICD-10-CM | POA: Diagnosis not present

## 2023-03-31 DIAGNOSIS — M5116 Intervertebral disc disorders with radiculopathy, lumbar region: Secondary | ICD-10-CM | POA: Diagnosis not present

## 2023-03-31 DIAGNOSIS — Z9889 Other specified postprocedural states: Secondary | ICD-10-CM | POA: Diagnosis not present

## 2023-03-31 DIAGNOSIS — M8588 Other specified disorders of bone density and structure, other site: Secondary | ICD-10-CM | POA: Diagnosis not present

## 2023-03-31 DIAGNOSIS — M5416 Radiculopathy, lumbar region: Secondary | ICD-10-CM | POA: Diagnosis not present

## 2023-03-31 DIAGNOSIS — M5136 Other intervertebral disc degeneration, lumbar region: Secondary | ICD-10-CM | POA: Diagnosis not present

## 2023-03-31 DIAGNOSIS — M4316 Spondylolisthesis, lumbar region: Secondary | ICD-10-CM | POA: Diagnosis not present

## 2023-03-31 DIAGNOSIS — M533 Sacrococcygeal disorders, not elsewhere classified: Secondary | ICD-10-CM | POA: Diagnosis not present

## 2023-03-31 DIAGNOSIS — M48062 Spinal stenosis, lumbar region with neurogenic claudication: Secondary | ICD-10-CM | POA: Diagnosis not present

## 2023-05-12 ENCOUNTER — Other Ambulatory Visit (HOSPITAL_BASED_OUTPATIENT_CLINIC_OR_DEPARTMENT_OTHER): Payer: Self-pay | Admitting: Internal Medicine

## 2023-05-13 DIAGNOSIS — Z9181 History of falling: Secondary | ICD-10-CM | POA: Diagnosis not present

## 2023-05-13 DIAGNOSIS — M4800 Spinal stenosis, site unspecified: Secondary | ICD-10-CM | POA: Diagnosis not present

## 2023-05-13 DIAGNOSIS — I69331 Monoplegia of upper limb following cerebral infarction affecting right dominant side: Secondary | ICD-10-CM | POA: Diagnosis not present

## 2023-05-13 DIAGNOSIS — M8580 Other specified disorders of bone density and structure, unspecified site: Secondary | ICD-10-CM | POA: Diagnosis not present

## 2023-05-13 DIAGNOSIS — E785 Hyperlipidemia, unspecified: Secondary | ICD-10-CM | POA: Diagnosis not present

## 2023-05-13 DIAGNOSIS — M5440 Lumbago with sciatica, unspecified side: Secondary | ICD-10-CM | POA: Diagnosis not present

## 2023-05-13 DIAGNOSIS — M1990 Unspecified osteoarthritis, unspecified site: Secondary | ICD-10-CM | POA: Diagnosis not present

## 2023-05-13 DIAGNOSIS — K219 Gastro-esophageal reflux disease without esophagitis: Secondary | ICD-10-CM | POA: Diagnosis not present

## 2023-05-13 DIAGNOSIS — I10 Essential (primary) hypertension: Secondary | ICD-10-CM | POA: Diagnosis not present

## 2023-05-15 DIAGNOSIS — M533 Sacrococcygeal disorders, not elsewhere classified: Secondary | ICD-10-CM | POA: Diagnosis not present

## 2023-05-15 DIAGNOSIS — M47896 Other spondylosis, lumbar region: Secondary | ICD-10-CM | POA: Diagnosis not present

## 2023-05-21 DIAGNOSIS — M533 Sacrococcygeal disorders, not elsewhere classified: Secondary | ICD-10-CM | POA: Diagnosis not present

## 2023-05-29 ENCOUNTER — Encounter: Payer: Self-pay | Admitting: Internal Medicine

## 2023-05-29 ENCOUNTER — Ambulatory Visit (INDEPENDENT_AMBULATORY_CARE_PROVIDER_SITE_OTHER): Payer: Medicare PPO | Admitting: Internal Medicine

## 2023-05-29 VITALS — BP 141/89 | HR 76 | Temp 97.9°F | Wt 133.4 lb

## 2023-05-29 DIAGNOSIS — E7800 Pure hypercholesterolemia, unspecified: Secondary | ICD-10-CM

## 2023-05-29 DIAGNOSIS — R7302 Impaired glucose tolerance (oral): Secondary | ICD-10-CM | POA: Diagnosis not present

## 2023-05-29 DIAGNOSIS — I10 Essential (primary) hypertension: Secondary | ICD-10-CM

## 2023-05-29 LAB — POCT GLYCOSYLATED HEMOGLOBIN (HGB A1C): Hemoglobin A1C: 5.6 % (ref 4.0–5.6)

## 2023-05-29 MED ORDER — VALSARTAN 160 MG PO TABS
160.0000 mg | ORAL_TABLET | Freq: Every day | ORAL | 1 refills | Status: DC
Start: 2023-05-29 — End: 2023-09-10

## 2023-05-29 NOTE — Assessment & Plan Note (Signed)
A1c stable to improved at 5.6 today.

## 2023-05-29 NOTE — Assessment & Plan Note (Signed)
On Repatha and ezetimibe.

## 2023-05-29 NOTE — Progress Notes (Signed)
Established Patient Office Visit     CC/Reason for Visit: Follow-up chronic medical conditions  HPI: Teresa Martinez is a 82 y.o. female who is coming in today for the above mentioned reasons. Past Medical History is significant for: Hypertension, hyperlipidemia, impaired glucose tolerance.  She has been feeling well.  She notes at home her blood pressures have remained elevated in the 140s to high 80s low 90s   Past Medical/Surgical History: Past Medical History:  Diagnosis Date   Cervical spine degeneration    C5/C6   Chronic right shoulder pain    GERD (gastroesophageal reflux disease)    Hemiparesis affecting dominant side as late effect of cerebrovascular accident ([facility]) 12/07/2014   Hyperlipemia    Hypertension    Menopausal syndrome    [name]   Overactive bladder    Statin intolerance    Stroke [facility]) 2015    Past Surgical History:  Procedure Laterality Date   ABDOMINAL HYSTERECTOMY     1985   BAND HEMORRHOIDECTOMY     2010   CATARACT EXTRACTION, BILATERAL     CHOLECYSTECTOMY     1950   LUMBAR LAMINECTOMY Bilateral 08/03/2018   Dr. Christain Sacramento, L3 laminotomy, L4-L5 laminectomy with neural foraminal decompression.   PAROTIDECTOMY     30 years ago   TOTAL KNEE ARTHROPLASTY Left 09/10/2022   Procedure: LEFT TOTAL KNEE ARTHROPLASTY;  Surgeon: Kathryne Hitch, MD;  Location: [facility];  Service: Orthopedics;  Laterality: Left;    Social History:  reports that she has never smoked. She has never used smokeless tobacco. She reports that she does not drink alcohol and does not use drugs.  Allergies: Allergies  Allergen Reactions   Oxycodone Nausea And Vomiting   Statins Other (See Comments)    Muscle pain and unable to walk Muscle pain and unable to walk   Wasp Venom    Latex Other (See Comments)    Burns her skin    Family History:  Family History  Problem Relation Age of Onset   Heart failure Father    Diabetes Mother     Hypertension Mother    Stroke Mother    Cirrhosis Sister    Diabetes Sister    Hyperlipidemia Brother    Hypertension Brother    Stroke Brother    Diabetes Brother      Current Outpatient Medications:    aspirin 81 MG chewable tablet, Chew 1 tablet (81 mg total) by mouth 2 (two) times daily., Disp: 30 tablet, Rfl: 0   Calcium Carbonate-Vit D-Min (CALTRATE PLUS PO), Take 1 tablet by mouth daily., Disp: , Rfl:    ezetimibe (ZETIA) 10 MG tablet, TAKE 1 TABLET(10 MG) BY MOUTH DAILY, Disp: 90 tablet, Rfl: 1   fesoterodine (TOVIAZ) 4 MG TB24 tablet, TAKE 1 TABLET(4 MG) BY MOUTH DAILY, Disp: 90 tablet, Rfl: 1   Multiple Vitamin (MULTIVITAMIN WITH MINERALS) TABS tablet, Take 1 tablet by mouth daily., Disp: , Rfl:    REPATHA SURECLICK 140 MG/ML SOAJ, INJECT 1 DOSE INTO SKIN EVERY 14 DAYS, Disp: 2 mL, Rfl: 11   valsartan (DIOVAN) 160 MG tablet, Take 1 tablet (160 mg total) by mouth daily., Disp: 90 tablet, Rfl: 1   pantoprazole (PROTONIX) 40 MG tablet, TAKE 1 TABLET(40 MG) BY MOUTH DAILY (Patient not taking: Reported on 05/29/2023), Disp: 90 tablet, Rfl: 1  Review of Systems:  Negative unless indicated in HPI.   Physical Exam: Vitals:   05/29/23 1529 05/29/23 1536  BP: Marland Kitchen)  146/89 (!) 141/89  Pulse: 76   Temp: 97.9 F (36.6 C)   TempSrc: Oral   SpO2: 100%   Weight: 133 lb 6.4 oz (60.5 kg)     Body mass index is 21.53 kg/m.   Physical Exam Vitals reviewed.  Constitutional:      Appearance: Normal appearance.  HENT:     Head: Normocephalic and atraumatic.  Eyes:     Conjunctiva/sclera: Conjunctivae normal.     Pupils: Pupils are equal, round, and reactive to light.  Cardiovascular:     Rate and Rhythm: Normal rate and regular rhythm.  Pulmonary:     Effort: Pulmonary effort is normal.     Breath sounds: Normal breath sounds.  Skin:    General: Skin is warm and dry.  Neurological:     General: No focal deficit present.     Mental Status: She is alert and oriented to  person, place, and time.  Psychiatric:        Mood and Affect: Mood normal.        Behavior: Behavior normal.        Thought Content: Thought content normal.        Judgment: Judgment normal.      Impression and Plan:  IGT (impaired glucose tolerance) Assessment & Plan: A1c stable to improved at 5.6 today.  Orders: -     POCT glycosylated hemoglobin (Hb A1C)  Essential hypertension Assessment & Plan: Blood pressure remains elevated above goal.  Discontinue lisinopril and placed on valsartan 160 mg daily.  She will return in 3 months for follow-up.  Orders: -     Valsartan; Take 1 tablet (160 mg total) by mouth daily.  Dispense: 90 tablet; Refill: 1  Pure hypercholesterolemia Assessment & Plan: On Repatha and ezetimibe.      Time spent:32 minutes reviewing chart, interviewing and examining patient and formulating plan of care.     Chaya Jan, MD [facility] at [facility]

## 2023-05-29 NOTE — Assessment & Plan Note (Signed)
Blood pressure remains elevated above goal.  Discontinue lisinopril and placed on valsartan 160 mg daily.  She will return in 3 months for follow-up.

## 2023-06-08 ENCOUNTER — Other Ambulatory Visit: Payer: Self-pay | Admitting: Internal Medicine

## 2023-06-08 DIAGNOSIS — I10 Essential (primary) hypertension: Secondary | ICD-10-CM

## 2023-06-08 DIAGNOSIS — K219 Gastro-esophageal reflux disease without esophagitis: Secondary | ICD-10-CM

## 2023-06-18 ENCOUNTER — Other Ambulatory Visit: Payer: Self-pay

## 2023-06-18 ENCOUNTER — Ambulatory Visit: Payer: Medicare PPO | Admitting: Orthopaedic Surgery

## 2023-06-18 DIAGNOSIS — Z96652 Presence of left artificial knee joint: Secondary | ICD-10-CM

## 2023-06-18 NOTE — Progress Notes (Signed)
The patient is now 9 months status post a left total knee arthroplasty.  She is getting ready to leave the country for several weeks and went to make sure her knee was doing well.  She says it is just a little bit sore but no known injury and says otherwise she is doing well.  She is an active 82 year old female.  On exam her left knee has full flexion and full extension.  The incision is well-healed.  It is not hot.  Swelling is minimal.  It feels ligamentously stable as well.  2 views left knee show well-seated total knee arthroplasty with no complicating features.  At this point I gave her reassurance that she can travel with no issues from our standpoint.  She should wear compressive socks when she travels and pump her feet during the plane flight.  Follow-up with any can be as needed at this point since she is doing well.  If she does develop problems she knows to let us know.

## 2023-06-19 ENCOUNTER — Other Ambulatory Visit: Payer: Self-pay | Admitting: Internal Medicine

## 2023-06-19 DIAGNOSIS — E7800 Pure hypercholesterolemia, unspecified: Secondary | ICD-10-CM

## 2023-08-09 ENCOUNTER — Other Ambulatory Visit: Payer: Self-pay | Admitting: Internal Medicine

## 2023-08-09 DIAGNOSIS — E7800 Pure hypercholesterolemia, unspecified: Secondary | ICD-10-CM

## 2023-08-11 ENCOUNTER — Telehealth: Payer: Self-pay | Admitting: Pharmacist

## 2023-08-11 MED ORDER — REPATHA SURECLICK 140 MG/ML ~~LOC~~ SOAJ: 140.0000 mg | SUBCUTANEOUS | 0 refills | Status: DC

## 2023-08-11 NOTE — Telephone Encounter (Signed)
Received fax from [facility] pharmacy to replace 1 Repatha pen for pt. Rx has been e-scribed.

## 2023-08-18 ENCOUNTER — Ambulatory Visit (INDEPENDENT_AMBULATORY_CARE_PROVIDER_SITE_OTHER): Payer: Medicare PPO

## 2023-08-18 VITALS — Ht 66.0 in | Wt 138.0 lb

## 2023-08-18 DIAGNOSIS — Z0000 Encounter for general adult medical examination without abnormal findings: Secondary | ICD-10-CM | POA: Diagnosis not present

## 2023-08-18 NOTE — Patient Instructions (Addendum)
Teresa Martinez , Thank you for taking time to come for your Medicare Wellness Visit. I appreciate your ongoing commitment to your health goals. Please review the following plan we discussed and let me know if I can assist you in the future.   Referrals/Orders/Follow-Ups/Clinician Recommendations:   This is a list of the screening recommended for you and due dates:  Health Maintenance  Topic Date Due   Flu Shot  06/19/2023   COVID-19 Vaccine (5 - 2023-24 season) 07/20/2023   Medicare Annual Wellness Visit  08/17/2024   DTaP/Tdap/Td vaccine (3 - Td or Tdap) 01/25/2032   Pneumonia Vaccine  Completed   DEXA scan (bone density measurement)  Completed   Zoster (Shingles) Vaccine  Completed   HPV Vaccine  Aged Out    Advanced directives: (Copy Requested) Please bring a copy of your health care power of attorney and living will to the office to be added to your chart at your convenience.  Next Medicare Annual Wellness Visit scheduled for next year: Yes

## 2023-08-18 NOTE — Progress Notes (Signed)
Subjective:   Teresa Martinez is a 82 y.o. female who presents for Medicare Annual (Subsequent) preventive examination.  Visit Complete: Virtual  I connected with  Teresa Martinez on 08/18/23 by a audio enabled telemedicine application and verified that I am speaking with the correct person using two identifiers.  Patient Location: Home  Provider Location: Home Office  I discussed the limitations of evaluation and management by telemedicine. The patient expressed understanding and agreed to proceed.  Patient Medicare AWV questionnaire was completed by the patient on 08/18/23; I have confirmed that all information answered by patient is correct and no changes since this date.  Cardiac Risk Factors include: advanced age (>31men, >18 women);hypertensionBecause this visit was a virtual/telehealth visit, some criteria may be missing or patient reported. Any vitals not documented were not able to be obtained and vitals that have been documented are patient reported.       Objective:    Today's Vitals   08/18/23 1436  Weight: 138 lb (62.6 kg)  Height: 5\' 6"  (1.676 m)   Body mass index is 22.27 kg/m.     08/18/2023    2:46 PM 09/23/2022    2:30 PM 09/10/2022   11:37 AM 09/03/2022    2:29 PM 03/30/2021    8:52 AM 03/12/2018   11:48 AM 03/12/2017    1:27 PM  Advanced Directives  Does Patient Have a Medical Advance Directive? Yes Yes  Yes Yes No No  Type of Estate agent of Sammy Martinez;Living will Healthcare Power of [employer] Power of [employer] Power of Attorney    Does patient want to make changes to medical advance directive?     No - Patient declined    Copy of Healthcare Power of Attorney in Chart? No - copy requested No - copy requested No - copy requested No - copy requested     Would patient like information on creating a medical advance directive?      Yes (MAU/Ambulatory/Procedural Areas - Information given)     Current  Medications (verified) Outpatient Encounter Medications as of 08/18/2023  Medication Sig   aspirin 81 MG chewable tablet Chew 1 tablet (81 mg total) by mouth 2 (two) times daily.   Calcium Carbonate-Vit D-Min (CALTRATE PLUS PO) Take 1 tablet by mouth daily.   ezetimibe (ZETIA) 10 MG tablet TAKE 1 TABLET(10 MG) BY MOUTH DAILY   fesoterodine (TOVIAZ) 4 MG TB24 tablet TAKE 1 TABLET(4 MG) BY MOUTH DAILY   Multiple Vitamin (MULTIVITAMIN WITH MINERALS) TABS tablet Take 1 tablet by mouth daily.   pantoprazole (PROTONIX) 40 MG tablet TAKE 1 TABLET(40 MG) BY MOUTH DAILY (Patient not taking: Reported on 08/18/2023)   REPATHA SURECLICK 140 MG/ML SOAJ INJECT 1 DOSE INTO SKIN EVERY 14 DAYS   valsartan (DIOVAN) 160 MG tablet Take 1 tablet (160 mg total) by mouth daily.   No facility-administered encounter medications on file as of 08/18/2023.    Allergies (verified) Oxycodone, Statins, Wasp venom, and Latex   History: Past Medical History:  Diagnosis Date   Cervical spine degeneration    C5/C6   Chronic right shoulder pain    GERD (gastroesophageal reflux disease)    Hemiparesis affecting dominant side as late effect of cerebrovascular accident (HCC) 12/07/2014   Hyperlipemia    Hypertension    Menopausal syndrome    [name]   Overactive bladder    Statin intolerance    Stroke [facility]) 2015   Past Surgical History:  Procedure Laterality  Date   ABDOMINAL HYSTERECTOMY     1985   BAND HEMORRHOIDECTOMY     2010   CATARACT EXTRACTION, BILATERAL     CHOLECYSTECTOMY     1950   LUMBAR LAMINECTOMY Bilateral 08/03/2018   Dr. Christain Sacramento, L3 laminotomy, L4-L5 laminectomy with neural foraminal decompression.   PAROTIDECTOMY     30 years ago   TOTAL KNEE ARTHROPLASTY Left 09/10/2022   Procedure: LEFT TOTAL KNEE ARTHROPLASTY;  Surgeon: Kathryne Hitch, MD;  Location: [facility] OR;  Service: Orthopedics;  Laterality: Left;   Family History  Problem Relation Age of Onset   Heart failure  Father    Diabetes Mother    Hypertension Mother    Stroke Mother    Cirrhosis Sister    Diabetes Sister    Hyperlipidemia Brother    Hypertension Brother    Stroke Brother    Diabetes Brother    Social History   Socioeconomic History   Marital status: Widowed    Spouse name: Not on file   Number of children: 4   Years of education: Not on file   Highest education level: Bachelor's degree (e.g., BA, AB, BS)  Occupational History   Occupation: retired  Tobacco Use   Smoking status: Never   Smokeless tobacco: Never  Vaping Use   Vaping status: Never Used  Substance and Sexual Activity   Alcohol use: No   Drug use: No   Sexual activity: Not on file  Other Topics Concern   Not on file  Social History Narrative   Regular exercise, 4 children, 6 grandchildren, one stepchild and two additional stepgrandchildren   Patient is right handed.   Patient doesn't drink caffeine.   Social Determinants of Health   Financial Resource Strain: Low Risk  (08/18/2023)   Overall Financial Resource Strain (CARDIA)    Difficulty of Paying Living Expenses: Not hard at all  Food Insecurity: No Food Insecurity (08/18/2023)   Hunger Vital Sign    Worried About Running Out of Food in the Last Year: Never true    Ran Out of Food in the Last Year: Never true  Transportation Needs: No Transportation Needs (08/18/2023)   PRAPARE - Administrator, Civil Service (Medical): No    Lack of Transportation (Non-Medical): No  Physical Activity: Sufficiently Active (08/18/2023)   Exercise Vital Sign    Days of Exercise per Week: 3 days    Minutes of Exercise per Session: 90 min  Stress: No Stress Concern Present (08/18/2023)   [employer] of Occupational Health - Occupational Stress Questionnaire    Feeling of Stress : Only a little  Social Connections: Unknown (08/18/2023)   Social Connection and Isolation Panel [NHANES]    Frequency of Communication with Friends and Family: More than  three times a week    Frequency of Social Gatherings with Friends and Family: Twice a week    Attends Religious Services: Not on Marketing executive or Organizations: Yes    Attends Banker Meetings: More than 4 times per year    Marital Status: Widowed    Tobacco Counseling Counseling given: Not Answered   Clinical Intake:  Pre-visit preparation completed: Yes  Pain : No/denies pain     BMI - recorded: 22.27 Nutritional Status: BMI of 19-24  Normal Nutritional Risks: None Diabetes: No  How often do you need to have someone help you when you read instructions, pamphlets, or other written materials from your doctor  or pharmacy?: 1 - Never  Interpreter Needed?: No  Information entered by :: Theresa Mulligan LPN   Activities of Daily Living    08/18/2023   12:27 PM 09/03/2022    2:35 PM  In your present state of health, do you have any difficulty performing the following activities:  Hearing? 0 0  Vision? 0 0  Difficulty concentrating or making decisions? 0 0  Walking or climbing stairs? 1 0  Comment Uses a cane   Dressing or bathing? 0 0  Doing errands, shopping? 0   Preparing Food and eating ? N   Using the Toilet? N   In the past six months, have you accidently leaked urine? Y   Comment Wears a pad. Followed by GYN   Do you have problems with loss of bowel control? N   Managing your Medications? N   Managing your Finances? N   Housekeeping or managing your Housekeeping? N     Patient Care Team: Philip Aspen, Limmie Patricia, MD as PCP - General (Internal Medicine)  Indicate any recent Medical Services you may have received from other than [facility] providers in the past year (date may be approximate).     Assessment:   This is a routine wellness examination for Teresa Martinez.  Hearing/Vision screen Hearing Screening - Comments:: Denies hearing difficulties   Vision Screening - Comments:: Wears rx glasses - up to date with routine eye exams with   Dr Dione Booze   Goals Addressed               This Visit's Progress     Get back to walking at my baseline (pt-stated)        G       Depression Screen    08/18/2023    2:41 PM 05/29/2023    3:38 PM 02/27/2023    3:19 PM 12/25/2022   11:33 AM 10/29/2022   12:57 PM 07/30/2022   10:10 AM 05/10/2022    4:46 PM  PHQ 2/9 Scores  PHQ - 2 Score 0 0  0  0 0  PHQ- 9 Score  2  1  2 2   Exception Documentation   Patient refusal  Patient refusal      Fall Risk    08/18/2023   12:27 PM 05/29/2023    3:38 PM 02/27/2023    3:18 PM 12/25/2022   11:33 AM 10/29/2022    1:25 PM  Fall Risk   Falls in the past year? 0 0 0 0 0  Number falls in past yr: 0 0 0 0 0  Injury with Fall? 0 0 0 0 0  Risk for fall due to : No Fall Risks  No Fall Risks No Fall Risks No Fall Risks  Follow up Falls prevention discussed Falls evaluation completed Falls evaluation completed Falls evaluation completed Falls evaluation completed    MEDICARE RISK AT HOME: Medicare Risk at Home Any stairs in or around the home?: Yes If so, are there any without handrails?: No Home free of loose throw rugs in walkways, pet beds, electrical cords, etc?: Yes Adequate lighting in your home to reduce risk of falls?: Yes Life alert?: No Use of a cane, walker or w/c?: Yes Grab bars in the bathroom?: Yes Shower chair or bench in shower?: Yes Elevated toilet seat or a handicapped toilet?: Yes  TIMED UP AND GO:  Was the test performed?  No    Cognitive Function:        08/18/2023  2:47 PM 08/18/2023    2:46 PM  6CIT Screen  What Year? 0 points 0 points  What month? 0 points 0 points  What time? 0 points 0 points  Count back from 20 0 points 0 points  Months in reverse 0 points 0 points  Repeat phrase 0 points 0 points  Total Score 0 points 0 points    Immunizations Immunization History  Administered Date(s) Administered   Fluad Quad(high Dose 65+) 07/23/2019, 07/26/2020, 07/27/2021   Influenza Split 07/24/2012    Influenza, High Dose Seasonal PF 08/28/2015, 08/16/2017, 09/26/2018   Influenza,inj,Quad PF,6+ Mos 07/30/2013, 08/26/2014   PFIZER(Purple Top)SARS-COV-2 Vaccination 12/07/2019, 12/26/2019, 09/12/2020, 05/09/2021   Pneumococcal Conjugate-13 08/26/2014   Pneumococcal Polysaccharide-23 07/19/2011   Tdap 07/19/2011, 01/24/2022   Zoster Recombinant(Shingrix) 07/23/2019, 09/23/2019   Zoster, Live 05/15/2015    TDAP status: Up to date  Flu Vaccine status: Due, Education has been provided regarding the importance of this vaccine. Advised may receive this vaccine at local pharmacy or Health Dept. Aware to provide a copy of the vaccination record if obtained from local pharmacy or Health Dept. Verbalized acceptance and understanding.  Pneumococcal vaccine status: Up to date  Covid-19 vaccine status: Declined, Education has been provided regarding the importance of this vaccine but patient still declined. Advised may receive this vaccine at local pharmacy or Health Dept.or vaccine clinic. Aware to provide a copy of the vaccination record if obtained from local pharmacy or Health Dept. Verbalized acceptance and understanding.  Qualifies for Shingles Vaccine? Yes   Zostavax completed Yes   Shingrix Completed?: Yes  Screening Tests Health Maintenance  Topic Date Due   INFLUENZA VACCINE  06/19/2023   COVID-19 Vaccine (5 - 2023-24 season) 07/20/2023   Medicare Annual Wellness (AWV)  08/17/2024   DTaP/Tdap/Td (3 - Td or Tdap) 01/25/2032   Pneumonia Vaccine 76+ Years old  Completed   DEXA SCAN  Completed   Zoster Vaccines- Shingrix  Completed   HPV VACCINES  Aged Out    Health Maintenance  Health Maintenance Due  Topic Date Due   INFLUENZA VACCINE  06/19/2023   COVID-19 Vaccine (5 - 2023-24 season) 07/20/2023        Bone Density status: Completed 01/15/23. Results reflect: Bone density results: OSTEOPENIA. Repeat every   years.     Additional Screening:    Vision Screening:  Recommended annual ophthalmology exams for early detection of glaucoma and other disorders of the eye. Is the patient up to date with their annual eye exam?  Yes  Who is the provider or what is the name of the office in which the patient attends annual eye exams? Dr Dione Booze If pt is not established with a provider, would they like to be referred to a provider to establish care? No .   Dental Screening: Recommended annual dental exams for proper oral hygiene   Community Resource Referral / Chronic Care Management:  CRR required this visit?  No   CCM required this visit?  No     Plan:     I have personally reviewed and noted the following in the patient's chart:   Medical and social history Use of alcohol, tobacco or illicit drugs  Current medications and supplements including opioid prescriptions. Patient is not currently taking opioid prescriptions. Functional ability and status Nutritional status Physical activity Advanced directives List of other physicians Hospitalizations, surgeries, and ER visits in previous 12 months Vitals Screenings to include cognitive, depression, and falls Referrals and appointments  In addition, I  have reviewed and discussed with patient certain preventive protocols, quality metrics, and best practice recommendations. A written personalized care plan for preventive services as well as general preventive health recommendations were provided to patient.     Tillie Rung, LPN   [id number]   After Visit Summary: (MyChart) Due to this being a telephonic visit, the after visit summary with patients personalized plan was offered to patient via MyChart   Nurse Notes: None

## 2023-08-25 DIAGNOSIS — M47816 Spondylosis without myelopathy or radiculopathy, lumbar region: Secondary | ICD-10-CM | POA: Diagnosis not present

## 2023-08-25 DIAGNOSIS — M533 Sacrococcygeal disorders, not elsewhere classified: Secondary | ICD-10-CM | POA: Diagnosis not present

## 2023-09-04 DIAGNOSIS — M47816 Spondylosis without myelopathy or radiculopathy, lumbar region: Secondary | ICD-10-CM | POA: Diagnosis not present

## 2023-09-10 ENCOUNTER — Ambulatory Visit (INDEPENDENT_AMBULATORY_CARE_PROVIDER_SITE_OTHER): Payer: Medicare PPO | Admitting: Internal Medicine

## 2023-09-10 ENCOUNTER — Encounter: Payer: Self-pay | Admitting: Internal Medicine

## 2023-09-10 VITALS — BP 110/80 | HR 76 | Temp 98.0°F | Ht 64.5 in | Wt 139.5 lb

## 2023-09-10 DIAGNOSIS — R7302 Impaired glucose tolerance (oral): Secondary | ICD-10-CM | POA: Diagnosis not present

## 2023-09-10 DIAGNOSIS — Z0000 Encounter for general adult medical examination without abnormal findings: Secondary | ICD-10-CM | POA: Diagnosis not present

## 2023-09-10 DIAGNOSIS — K219 Gastro-esophageal reflux disease without esophagitis: Secondary | ICD-10-CM

## 2023-09-10 DIAGNOSIS — Z23 Encounter for immunization: Secondary | ICD-10-CM

## 2023-09-10 DIAGNOSIS — I10 Essential (primary) hypertension: Secondary | ICD-10-CM | POA: Diagnosis not present

## 2023-09-10 DIAGNOSIS — E7800 Pure hypercholesterolemia, unspecified: Secondary | ICD-10-CM | POA: Diagnosis not present

## 2023-09-10 LAB — CBC WITH DIFFERENTIAL/PLATELET
Basophils Absolute: 0 10*3/uL (ref 0.0–0.1)
Basophils Relative: 0.4 % (ref 0.0–3.0)
Eosinophils Absolute: 0.2 10*3/uL (ref 0.0–0.7)
Eosinophils Relative: 3.6 % (ref 0.0–5.0)
HCT: 44.9 % (ref 36.0–46.0)
Hemoglobin: 14.1 g/dL (ref 12.0–15.0)
Lymphocytes Relative: 38.2 % (ref 12.0–46.0)
Lymphs Abs: 1.9 10*3/uL (ref 0.7–4.0)
MCHC: 31.5 g/dL (ref 30.0–36.0)
MCV: 81.1 fL (ref 78.0–100.0)
Monocytes Absolute: 0.5 10*3/uL (ref 0.1–1.0)
Monocytes Relative: 9.5 % (ref 3.0–12.0)
Neutro Abs: 2.4 10*3/uL (ref 1.4–7.7)
Neutrophils Relative %: 48.3 % (ref 43.0–77.0)
Platelets: 261 10*3/uL (ref 150.0–400.0)
RBC: 5.53 Mil/uL — ABNORMAL HIGH (ref 3.87–5.11)
RDW: 15.3 % (ref 11.5–15.5)
WBC: 4.9 10*3/uL (ref 4.0–10.5)

## 2023-09-10 LAB — LIPID PANEL
Cholesterol: 158 mg/dL (ref 0–200)
HDL: 54.8 mg/dL (ref >39.00)
LDL Cholesterol: 78 mg/dL (ref 0–99)
NonHDL: 103.63
Total CHOL/HDL Ratio: 3
Triglycerides: 130 mg/dL (ref 0.0–149.0)
VLDL: 26 mg/dL (ref 0.0–40.0)

## 2023-09-10 LAB — COMPREHENSIVE METABOLIC PANEL
ALT: 15 U/L (ref 0–35)
AST: 24 U/L (ref 0–37)
Albumin: 4.3 g/dL (ref 3.5–5.2)
Alkaline Phosphatase: 68 U/L (ref 39–117)
BUN: 11 mg/dL (ref 6–23)
CO2: 29 meq/L (ref 19–32)
Calcium: 9.6 mg/dL (ref 8.4–10.5)
Chloride: 103 meq/L (ref 96–112)
Creatinine, Ser: 0.83 mg/dL (ref 0.40–1.20)
GFR: 64.79 mL/min (ref >60.00)
Glucose, Bld: 91 mg/dL (ref 70–99)
Potassium: 4 meq/L (ref 3.5–5.1)
Sodium: 139 meq/L (ref 135–145)
Total Bilirubin: 0.4 mg/dL (ref 0.2–1.2)
Total Protein: 6.8 g/dL (ref 6.0–8.3)

## 2023-09-10 LAB — TSH: TSH: 1.3 u[IU]/mL (ref 0.35–5.50)

## 2023-09-10 LAB — VITAMIN B12: Vitamin B-12: 924 pg/mL — ABNORMAL HIGH (ref 211–911)

## 2023-09-10 LAB — HEMOGLOBIN A1C: Hgb A1c MFr Bld: 5.9 % (ref 4.6–6.5)

## 2023-09-10 LAB — VITAMIN D 25 HYDROXY (VIT D DEFICIENCY, FRACTURES): VITD: 42.65 ng/mL (ref 30.00–100.00)

## 2023-09-10 MED ORDER — FESOTERODINE FUMARATE ER 4 MG PO TB24: ORAL_TABLET | ORAL | 1 refills | Status: DC

## 2023-09-10 MED ORDER — PANTOPRAZOLE SODIUM 40 MG PO TBEC: 40.0000 mg | DELAYED_RELEASE_TABLET | Freq: Every day | ORAL | 1 refills | Status: DC

## 2023-09-10 MED ORDER — VALSARTAN 160 MG PO TABS: 160.0000 mg | ORAL_TABLET | Freq: Every day | ORAL | 1 refills | Status: DC

## 2023-09-10 MED ORDER — EZETIMIBE 10 MG PO TABS: ORAL_TABLET | ORAL | 1 refills | Status: DC

## 2023-09-10 NOTE — Addendum Note (Signed)
Addended by: Kern Reap B on: 09/10/2023 09:50 AM   Modules accepted: Orders

## 2023-09-10 NOTE — Progress Notes (Signed)
Established Patient Office Visit     CC/Reason for Visit: Annual preventive exam  HPI: Teresa Martinez is a 82 y.o. female who is coming in today for the above mentioned reasons. Past Medical History is significant for: Hypertension, hyperlipidemia, impaired glucose tolerance.  Has been feeling well other than some right hip and lower back pain for which she is having an ablation.  She is due for flu, COVID, RSV vaccines.  She had a mammogram in March.   Past Medical/Surgical History: Past Medical History:  Diagnosis Date   Cervical spine degeneration    C5/C6   Chronic right shoulder pain    GERD (gastroesophageal reflux disease)    Hemiparesis affecting dominant side as late effect of cerebrovascular accident (HCC) 12/07/2014   Hyperlipemia    Hypertension    Menopausal syndrome    [name]   Overactive bladder    Statin intolerance    Stroke [facility]) 2015    Past Surgical History:  Procedure Laterality Date   ABDOMINAL HYSTERECTOMY     1985   BAND HEMORRHOIDECTOMY     2010   CATARACT EXTRACTION, BILATERAL     CHOLECYSTECTOMY     1950   LUMBAR LAMINECTOMY Bilateral 08/03/2018   Dr. Christain Sacramento, L3 laminotomy, L4-L5 laminectomy with neural foraminal decompression.   PAROTIDECTOMY     30 years ago   TOTAL KNEE ARTHROPLASTY Left 09/10/2022   Procedure: LEFT TOTAL KNEE ARTHROPLASTY;  Surgeon: Kathryne Hitch, MD;  Location: [facility] OR;  Service: Orthopedics;  Laterality: Left;    Social History:  reports that she has never smoked. She has never used smokeless tobacco. She reports that she does not drink alcohol and does not use drugs.  Allergies: Allergies  Allergen Reactions   Oxycodone Nausea And Vomiting   Statins Other (See Comments)    Muscle pain and unable to walk Muscle pain and unable to walk   Wasp Venom    Latex Other (See Comments)    Burns her skin    Family History:  Family History  Problem Relation Age of Onset   Heart  failure Father    Diabetes Mother    Hypertension Mother    Stroke Mother    Cirrhosis Sister    Diabetes Sister    Hyperlipidemia Brother    Hypertension Brother    Stroke Brother    Diabetes Brother      Current Outpatient Medications:    aspirin 81 MG chewable tablet, Chew 1 tablet (81 mg total) by mouth 2 (two) times daily., Disp: 30 tablet, Rfl: 0   Calcium Carbonate-Vit D-Min (CALTRATE PLUS PO), Take 1 tablet by mouth daily., Disp: , Rfl:    Multiple Vitamin (MULTIVITAMIN WITH MINERALS) TABS tablet, Take 1 tablet by mouth daily., Disp: , Rfl:    REPATHA SURECLICK 140 MG/ML SOAJ, INJECT 1 DOSE INTO SKIN EVERY 14 DAYS, Disp: 2 mL, Rfl: 11   ezetimibe (ZETIA) 10 MG tablet, TAKE 1 TABLET(10 MG) BY MOUTH DAILY, Disp: 90 tablet, Rfl: 1   fesoterodine (TOVIAZ) 4 MG TB24 tablet, TAKE 1 TABLET(4 MG) BY MOUTH DAILY, Disp: 90 tablet, Rfl: 1   pantoprazole (PROTONIX) 40 MG tablet, Take 1 tablet (40 mg total) by mouth daily., Disp: 90 tablet, Rfl: 1   valsartan (DIOVAN) 160 MG tablet, Take 1 tablet (160 mg total) by mouth daily., Disp: 90 tablet, Rfl: 1  Review of Systems:  Negative unless indicated in HPI.   Physical Exam: Vitals:  09/10/23 0858  BP: 110/80  Pulse: 76  Temp: 98 F (36.7 C)  TempSrc: Oral  SpO2: 96%  Weight: 139 lb 8 oz (63.3 kg)  Height: 5' 4.5" (1.638 m)    Body mass index is 23.58 kg/m.   Physical Exam Vitals reviewed.  Constitutional:      General: She is not in acute distress.    Appearance: Normal appearance. She is not ill-appearing, toxic-appearing or diaphoretic.  HENT:     Head: Normocephalic.     Right Ear: Tympanic membrane, ear canal and external ear normal. There is no impacted cerumen.     Left Ear: Tympanic membrane, ear canal and external ear normal. There is no impacted cerumen.     Nose: Nose normal.     Mouth/Throat:     Mouth: Mucous membranes are moist.     Pharynx: Oropharynx is clear. No oropharyngeal exudate or posterior  oropharyngeal erythema.  Eyes:     General: No scleral icterus.       Right eye: No discharge.        Left eye: No discharge.     Conjunctiva/sclera: Conjunctivae normal.     Pupils: Pupils are equal, round, and reactive to light.  Neck:     Vascular: No carotid bruit.  Cardiovascular:     Rate and Rhythm: Normal rate and regular rhythm.     Pulses: Normal pulses.     Heart sounds: Normal heart sounds.  Pulmonary:     Effort: Pulmonary effort is normal. No respiratory distress.     Breath sounds: Normal breath sounds.  Abdominal:     General: Abdomen is flat. Bowel sounds are normal.     Palpations: Abdomen is soft.  Musculoskeletal:        General: Normal range of motion.     Cervical back: Normal range of motion.  Skin:    General: Skin is warm and dry.  Neurological:     General: No focal deficit present.     Mental Status: She is alert and oriented to person, place, and time. Mental status is at baseline.  Psychiatric:        Mood and Affect: Mood normal.        Behavior: Behavior normal.        Thought Content: Thought content normal.        Judgment: Judgment normal.     Flowsheet Row Office Visit from 09/10/2023 in [facility] [place]  PHQ-9 Total Score 2        Impression and Plan:  Encounter for preventive health examination  IGT (impaired glucose tolerance) -     Hemoglobin A1c; Future  Essential hypertension -     CBC with Differential/Platelet; Future -     Comprehensive metabolic panel; Future -     TSH; Future -     Vitamin B12; Future -     VITAMIN D 25 Hydroxy (Vit-D Deficiency, Fractures); Future -     Fesoterodine Fumarate ER; TAKE 1 TABLET(4 MG) BY MOUTH DAILY  Dispense: 90 tablet; Refill: 1 -     Valsartan; Take 1 tablet (160 mg total) by mouth daily.  Dispense: 90 tablet; Refill: 1  Pure hypercholesterolemia -     Lipid panel; Future -     Ezetimibe; TAKE 1 TABLET(10 MG) BY MOUTH DAILY  Dispense: 90 tablet;  Refill: 1  Gastroesophageal reflux disease, unspecified whether esophagitis present -     Pantoprazole Sodium; Take 1 tablet (40  mg total) by mouth daily.  Dispense: 90 tablet; Refill: 1  Immunization due   -Recommend routine eye and dental care. -Healthy lifestyle discussed in detail. -Labs to be updated today. -Prostate cancer screening: N/A Health Maintenance  Topic Date Due   Flu Shot  06/19/2023   COVID-19 Vaccine (5 - 2023-24 season) 07/20/2023   Medicare Annual Wellness Visit  08/17/2024   DTaP/Tdap/Td vaccine (3 - Td or Tdap) 01/25/2032   Pneumonia Vaccine  Completed   DEXA scan (bone density measurement)  Completed   Zoster (Shingles) Vaccine  Completed   HPV Vaccine  Aged Out     -Flu vaccine in office today. -She will update COVID and RSV vaccines at pharmacy.  She no longer does cervical or colon cancer screening due to age.  She prefers to keep her mammograms up-to-date.    Chaya Jan, MD [facility]

## 2023-09-18 DIAGNOSIS — M47816 Spondylosis without myelopathy or radiculopathy, lumbar region: Secondary | ICD-10-CM | POA: Diagnosis not present

## 2023-10-02 DIAGNOSIS — H04123 Dry eye syndrome of bilateral lacrimal glands: Secondary | ICD-10-CM | POA: Diagnosis not present

## 2023-10-02 DIAGNOSIS — Z961 Presence of intraocular lens: Secondary | ICD-10-CM | POA: Diagnosis not present

## 2023-10-21 DIAGNOSIS — M47816 Spondylosis without myelopathy or radiculopathy, lumbar region: Secondary | ICD-10-CM | POA: Diagnosis not present

## 2023-10-21 DIAGNOSIS — M47817 Spondylosis without myelopathy or radiculopathy, lumbosacral region: Secondary | ICD-10-CM | POA: Diagnosis not present

## 2023-11-06 DIAGNOSIS — M47896 Other spondylosis, lumbar region: Secondary | ICD-10-CM | POA: Diagnosis not present

## 2023-11-06 DIAGNOSIS — M533 Sacrococcygeal disorders, not elsewhere classified: Secondary | ICD-10-CM | POA: Diagnosis not present

## 2023-11-27 ENCOUNTER — Other Ambulatory Visit: Payer: Self-pay | Admitting: Internal Medicine

## 2023-11-27 DIAGNOSIS — I10 Essential (primary) hypertension: Secondary | ICD-10-CM

## 2023-12-15 ENCOUNTER — Telehealth: Payer: Self-pay | Admitting: Orthopaedic Surgery

## 2023-12-15 NOTE — Telephone Encounter (Signed)
Patient called. Says she did something to the operate knee. She stepped down wrong and now can not walk on it. Would like to be seen this week. [phone]

## 2023-12-16 ENCOUNTER — Ambulatory Visit: Payer: Medicare PPO | Admitting: Physician Assistant

## 2023-12-16 ENCOUNTER — Encounter: Payer: Self-pay | Admitting: Physician Assistant

## 2023-12-16 ENCOUNTER — Other Ambulatory Visit: Payer: Self-pay | Admitting: Internal Medicine

## 2023-12-16 ENCOUNTER — Other Ambulatory Visit (INDEPENDENT_AMBULATORY_CARE_PROVIDER_SITE_OTHER): Payer: Medicare PPO

## 2023-12-16 DIAGNOSIS — E7841 Elevated Lipoprotein(a): Secondary | ICD-10-CM

## 2023-12-16 DIAGNOSIS — Z96652 Presence of left artificial knee joint: Secondary | ICD-10-CM | POA: Diagnosis not present

## 2023-12-16 DIAGNOSIS — E7849 Other hyperlipidemia: Secondary | ICD-10-CM

## 2023-12-16 DIAGNOSIS — M791 Myalgia, unspecified site: Secondary | ICD-10-CM

## 2023-12-16 DIAGNOSIS — Z8673 Personal history of transient ischemic attack (TIA), and cerebral infarction without residual deficits: Secondary | ICD-10-CM

## 2023-12-16 NOTE — Progress Notes (Signed)
Office Visit Note   Patient: Teresa Martinez           Date of Birth: 06/09/1941           MRN: [id number] Visit Date: 12/16/2023              Requested by: Philip Aspen, Limmie Patricia, MD [address] [place],  [place] [address] PCP: Philip Aspen, Limmie Patricia, MD  No chief complaint on file.     HPI: Patient is a pleasant 83 year old woman who is a patient of Dr. Eliberto Ivory.  She is status post left total knee arthroplasty about 15 months ago.  She had been doing well until 5 days ago when she hyperextended her left knee.  She said initially she did not place any weight on it she is doing a little bit better now wants to make sure that she did not compromise knee replacement  Assessment & Plan: Visit Diagnoses:  1. Status post left knee replacement     Plan: Exam today of her knee was benign she had no effusion no erythema she has good active extension and flexion.  Compartments are soft and compressible may be just a tiny bit tender over the medial anterior joint line.  Good stability.  She has gotten significantly better than thirst I have told her to continue to treat this symptomatically can follow-up if she does not continue to improve  Follow-Up Instructions: No follow-ups on file.   Ortho Exam  Patient is alert, oriented, no adenopathy, well-dressed, normal affect, normal respiratory effort. Examination of her knee she is neurovascular intact she has good full extension and flexion to 120 degrees.  Compartments are soft and nontender tender.  She has good active dorsiflexion plantarflexion mildly tender over the anterior medial joint line no heat no effusion  Imaging: No results found. No images are attached to the encounter.  Labs: Lab Results  Component Value Date   HGBA1C 5.9 09/10/2023   HGBA1C 5.6 05/29/2023   HGBA1C 6.0 (A) 02/27/2023   ESRSEDRATE 17 04/28/2018   ESRSEDRATE 3 03/12/2017   ESRSEDRATE 8 02/17/2017   CRP <0.8 03/12/2017      Lab Results  Component Value Date   ALBUMIN 4.3 09/10/2023   ALBUMIN 4.2 07/30/2022   ALBUMIN 4.5 01/24/2022    Lab Results  Component Value Date   MG 2.3 01/24/2022   Lab Results  Component Value Date   VD25OH 42.65 09/10/2023   VD25OH 53.86 07/30/2022   VD25OH 42.01 07/27/2021    No results found for: "PREALBUMIN"    Latest Ref Rng & Units 09/10/2023    9:31 AM 09/11/2022    6:42 AM 09/03/2022    3:00 PM  CBC EXTENDED  WBC 4.0 - 10.5 K/uL 4.9  10.0  7.1   RBC 3.87 - 5.11 Mil/uL 5.53  4.40  5.28   Hemoglobin 12.0 - 15.0 g/dL 38.7  56.4  33.2   HCT 36.0 - 46.0 % 44.9  36.3  43.5   Platelets 150.0 - 400.0 K/uL 261.0  220  267   NEUT# 1.4 - 7.7 K/uL 2.4     Lymph# 0.7 - 4.0 K/uL 1.9        There is no height or weight on file to calculate BMI.  Orders:  No orders of the defined types were placed in this encounter.  No orders of the defined types were placed in this encounter.    Procedures: No procedures performed  Clinical Data: No  additional findings.  ROS:  All other systems negative, except as noted in the HPI. Review of Systems  Objective: Vital Signs: There were no vitals taken for this visit.  Specialty Comments:  No specialty comments available.  PMFS History: Patient Active Problem List   Diagnosis Date Noted   Status post left knee replacement 09/10/2022   IGT (impaired glucose tolerance) 07/27/2021   Preop testing 07/09/2018   Degenerative lumbar spinal stenosis 05/12/2018   Statin myopathy 08/29/2016   Iliotibial band syndrome of right side 04/23/2016   Acromioclavicular arthrosis 06/30/2015   Adhesive capsulitis of right shoulder 02/06/2015   Dysarthria due to cerebrovascular accident 01/09/2015   Hemiparesis affecting dominant side as late effect of cerebrovascular accident (HCC) 12/07/2014   Spastic neurogenic bladder 10/31/2014   Right rotator cuff tendonitis 10/19/2014   Stenosis of cervical spine region    Cerebral  infarction due to thrombosis of left middle cerebral artery (HCC)    Left pontine stroke (HCC)    TIA (transient ischemic attack) 10/14/2014   Overactive bladder 10/14/2014   H/O: CVA (cerebrovascular accident) 10/14/2014   Essential hypertension 10/10/2009   INSOMNIA 09/15/2009   Pure hypercholesterolemia 11/28/2008   MENOPAUSAL SYNDROME 11/28/2008   Past Medical History:  Diagnosis Date   Cervical spine degeneration    C5/C6   Chronic right shoulder pain    GERD (gastroesophageal reflux disease)    Hemiparesis affecting dominant side as late effect of cerebrovascular accident (HCC) 12/07/2014   Hyperlipemia    Hypertension    Menopausal syndrome    Floyde Parkins)   Overactive bladder    Statin intolerance    Stroke [facility]) 2015    Family History  Problem Relation Age of Onset   Heart failure Father    Diabetes Mother    Hypertension Mother    Stroke Mother    Cirrhosis Sister    Diabetes Sister    Hyperlipidemia Brother    Hypertension Brother    Stroke Brother    Diabetes Brother     Past Surgical History:  Procedure Laterality Date   ABDOMINAL HYSTERECTOMY     1985   BAND HEMORRHOIDECTOMY     2010   CATARACT EXTRACTION, BILATERAL     CHOLECYSTECTOMY     1950   LUMBAR LAMINECTOMY Bilateral 08/03/2018   Dr. Christain Sacramento, L3 laminotomy, L4-L5 laminectomy with neural foraminal decompression.   PAROTIDECTOMY     30 years ago   TOTAL KNEE ARTHROPLASTY Left 09/10/2022   Procedure: LEFT TOTAL KNEE ARTHROPLASTY;  Surgeon: Kathryne Hitch, MD;  Location: [facility] OR;  Service: Orthopedics;  Laterality: Left;   Social History   Occupational History   Occupation: retired  Tobacco Use   Smoking status: Never   Smokeless tobacco: Never  Vaping Use   Vaping status: Never Used  Substance and Sexual Activity   Alcohol use: No   Drug use: No   Sexual activity: Not on file

## 2023-12-30 ENCOUNTER — Ambulatory Visit: Payer: Self-pay | Admitting: Internal Medicine

## 2023-12-30 NOTE — Telephone Encounter (Signed)
  Chief Complaint: arthritis pain Symptoms: right lower back pain radiating down right hip and right leg, left thigh pain radiating down left leg Frequency: constant ongoing for months, worsening Pertinent Negatives: Patient denies abdominal pain, fever, changes in bowel or urination, numbness, weakness. Disposition: [] ED /[] Urgent Care (no appt availability in office) / [x] Appointment(In office/virtual)/ []  [employer] [facility]/ [] Home Care/ [x] Refused Recommended Disposition /[] [facility]/ []  Follow-up with PCP Additional Notes: Patient states she has been treating the arthritis pain at home with Voltaren gel, Advil and Aleve. She is requesting meloxicam, when asked if she has taken the medication before or why she is requesting the specific medication she states her daughter is a Teacher, early years/pre and told her to ask her PCP to send her some to try for the pain. Advised patient she would need to come in for a office visit. Patient offered appointment tomorrow with PCP and she states the weather will be too bad to come in. She is requesting Friday or Monday appointment and refused other available providers. Please advise.   Copied from CRM [id number]. Topic: Clinical - Red Word Triage >> Dec 30, 2023  1:14 PM Denese Killings wrote: [facility] that prompted transfer to Nurse Triage:  Patient is having symptoms of arthiritis. She has pain on her lower body and wants to know if she can be prescribed meloxicam. She has pain all the time Reason for Disposition  [1] MODERATE back pain (e.g., interferes with normal activities) AND [2] present > 3 days  Answer Assessment - Initial Assessment Questions 1. ONSET: "When did the pain begin?"      X months, "I've just been putting up with it, it was bothering me at my last physical in October. Since then just keeps getting worse and worse"  2. LOCATION: "Where does it hurt?" (upper, mid or lower back)     Right lower back down to right hip and down right leg,  left thigh down left leg.  3. SEVERITY: "How bad is the pain?"  (e.g., Scale 1-10; mild, moderate, or severe)   - MILD (1-3): Doesn't interfere with normal activities.    - MODERATE (4-7): Interferes with normal activities or awakens from sleep.    - SEVERE (8-10): Excruciating pain, unable to do any normal activities.      4-5/10.  4. PATTERN: "Is the pain constant?" (e.g., yes, no; constant, intermittent)      Constant.  5. RADIATION: "Does the pain shoot into your legs or somewhere else?"     Down both legs.  6. CAUSE:  "What do you think is causing the back pain?"      Patient states this has been an ongoing problem. 7. BACK OVERUSE:  "Any recent lifting of heavy objects, strenuous work or exercise?"     Denies any recent exercise or overuse. 8. MEDICINES: "What have you taken so far for the pain?" (e.g., nothing, acetaminophen, NSAIDS)     Voltaren gel, Advil and Aleve. 9. NEUROLOGIC SYMPTOMS: "Do you have any weakness, numbness, or problems with bowel/bladder control?"     Denies. 10. OTHER SYMPTOMS: "Do you have any other symptoms?" (e.g., fever, abdomen pain, burning with urination, blood in urine)       Denies.  Protocols used: Back Pain-A-[facility]

## 2024-01-15 ENCOUNTER — Encounter: Payer: Self-pay | Admitting: Internal Medicine

## 2024-01-15 ENCOUNTER — Telehealth: Payer: Self-pay | Admitting: Internal Medicine

## 2024-01-15 ENCOUNTER — Ambulatory Visit: Payer: Medicare PPO | Admitting: Internal Medicine

## 2024-01-15 VITALS — BP 140/82 | HR 80 | Temp 98.3°F | Ht 65.5 in | Wt 142.6 lb

## 2024-01-15 DIAGNOSIS — M791 Myalgia, unspecified site: Secondary | ICD-10-CM | POA: Diagnosis not present

## 2024-01-15 MED ORDER — MELOXICAM 7.5 MG PO TABS: 7.5000 mg | ORAL_TABLET | Freq: Every day | ORAL | 0 refills | Status: DC

## 2024-01-15 NOTE — Telephone Encounter (Signed)
 roUTING TO CORRECT TRIAGE POOL

## 2024-01-15 NOTE — Progress Notes (Signed)
 Established Patient Office Visit     CC/Reason for Visit: Muscle aches  HPI: Teresa Martinez is a 83 y.o. female who is coming in today for the above mentioned reasons.  She has been having myalgias now for a few weeks that has been progressing.  It is mainly around her thighs and upper arms.  She has a history of statin induced myalgias.  Was started on Repatha due to familial hyperlipidemia.  She is requesting a prescription for meloxicam to help with muscle aches.  She believes these are related to arthritis.  She has had no recent URI symptoms.   Past Medical/Surgical History: Past Medical History:  Diagnosis Date   Cervical spine degeneration    C5/C6   Chronic right shoulder pain    GERD (gastroesophageal reflux disease)    Hemiparesis affecting dominant side as late effect of cerebrovascular accident (HCC) 12/07/2014   Hyperlipemia    Hypertension    Menopausal syndrome    [name]   Overactive bladder    Statin intolerance    Stroke [facility]) 2015    Past Surgical History:  Procedure Laterality Date   ABDOMINAL HYSTERECTOMY     1985   BAND HEMORRHOIDECTOMY     2010   CATARACT EXTRACTION, BILATERAL     CHOLECYSTECTOMY     1950   LUMBAR LAMINECTOMY Bilateral 08/03/2018   Dr. Christain Sacramento, L3 laminotomy, L4-L5 laminectomy with neural foraminal decompression.   PAROTIDECTOMY     30 years ago   TOTAL KNEE ARTHROPLASTY Left 09/10/2022   Procedure: LEFT TOTAL KNEE ARTHROPLASTY;  Surgeon: Kathryne Hitch, MD;  Location: [facility] OR;  Service: Orthopedics;  Laterality: Left;    Social History:  reports that she has never smoked. She has never used smokeless tobacco. She reports that she does not drink alcohol and does not use drugs.  Allergies: Allergies  Allergen Reactions   Oxycodone Nausea And Vomiting   Statins Other (See Comments)    Muscle pain and unable to walk Muscle pain and unable to walk   Wasp Venom    Latex Other (See Comments)     Burns her skin    Family History:  Family History  Problem Relation Age of Onset   Heart failure Father    Diabetes Mother    Hypertension Mother    Stroke Mother    Cirrhosis Sister    Diabetes Sister    Hyperlipidemia Brother    Hypertension Brother    Stroke Brother    Diabetes Brother      Current Outpatient Medications:    aspirin 81 MG chewable tablet, Chew 1 tablet (81 mg total) by mouth 2 (two) times daily., Disp: 30 tablet, Rfl: 0   Calcium Carbonate-Vit D-Min (CALTRATE PLUS PO), Take 1 tablet by mouth daily., Disp: , Rfl:    COMIRNATY syringe, Inject 0.3 mLs into the muscle once., Disp: , Rfl:    ezetimibe (ZETIA) 10 MG tablet, TAKE 1 TABLET(10 MG) BY MOUTH DAILY, Disp: 90 tablet, Rfl: 1   fesoterodine (TOVIAZ) 4 MG TB24 tablet, TAKE 1 TABLET(4 MG) BY MOUTH DAILY, Disp: 90 tablet, Rfl: 1   meloxicam (MOBIC) 7.5 MG tablet, Take 1 tablet (7.5 mg total) by mouth daily., Disp: 30 tablet, Rfl: 0   Multiple Vitamin (MULTIVITAMIN WITH MINERALS) TABS tablet, Take 1 tablet by mouth daily., Disp: , Rfl:    pantoprazole (PROTONIX) 40 MG tablet, Take 1 tablet (40 mg total) by mouth daily., Disp: 90 tablet, Rfl:  1   REPATHA SURECLICK 140 MG/ML SOAJ, INJECT 1 DOSE INTO SKIN EVERY 14 DAYS, Disp: 2 mL, Rfl: 11   valsartan (DIOVAN) 160 MG tablet, TAKE 1 TABLET(160 MG) BY MOUTH DAILY, Disp: 90 tablet, Rfl: 1  Review of Systems:  Negative unless indicated in HPI.   Physical Exam: Vitals:   01/15/24 1324  BP: (!) 140/82  Pulse: 80  Temp: 98.3 F (36.8 C)  TempSrc: Oral  SpO2: 96%  Weight: 142 lb 9.6 oz (64.7 kg)  Height: 5' 5.5" (1.664 m)    Body mass index is 23.37 kg/m.     Impression and Plan:  Myalgia -     Meloxicam; Take 1 tablet (7.5 mg total) by mouth daily.  Dispense: 30 tablet; Refill: 0  -I have a suspicion that the evolocumab could be causing her myalgias.  Have advised her to contact Dr. Blanchie Dessert office to discuss.  In the meantime I will give 30 days  of meloxicam.  Do not expect this will help her significantly.   Time spent:30 minutes reviewing chart, interviewing and examining patient and formulating plan of care.     Chaya Jan, MD [facility] at [facility]

## 2024-01-15 NOTE — Telephone Encounter (Signed)
 Pt c/o medication issue:  1. Name of Medication: REPATHA SURECLICK 140 MG/ML SOAJ   2. How are you currently taking this medication (dosage and times per day)?   3. Are you having a reaction (difficulty breathing--STAT)?   4. What is your medication issue?  Patient is requesting call back due to pain she is experiencing in her leg she believes is due to this medication. She states she was advised by her PCP to call Dr. Rennis Golden to get the okay to pause this medication to see if this is the cause. Please advise.

## 2024-01-16 NOTE — Telephone Encounter (Signed)
 Chrystie Nose, MD  You; Velta Addison, Chantel M, LPN7 minutes ago (8:15 AM)    She can skip the next dose of Repatha to see if it improves, however, it is unlikely related.  Dr Rennis Golden

## 2024-01-16 NOTE — Telephone Encounter (Signed)
 MyChart message sent to patient.

## 2024-01-26 DIAGNOSIS — Z1231 Encounter for screening mammogram for malignant neoplasm of breast: Secondary | ICD-10-CM | POA: Diagnosis not present

## 2024-01-26 DIAGNOSIS — Z01419 Encounter for gynecological examination (general) (routine) without abnormal findings: Secondary | ICD-10-CM | POA: Diagnosis not present

## 2024-01-26 DIAGNOSIS — Z1331 Encounter for screening for depression: Secondary | ICD-10-CM | POA: Diagnosis not present

## 2024-01-26 LAB — HM MAMMOGRAPHY

## 2024-01-26 NOTE — Telephone Encounter (Signed)
 Left message that MD provided input/recommendations about possible side effects and this has been sent via MyChart. Advised to call back with questions/concerns.

## 2024-01-26 NOTE — Telephone Encounter (Signed)
 Spoke with patient. She cannot access MyChart right now. Informed her of MD recommendation to hold Repatha dose x1 and follow symptoms. Reminded her of 03/30/24 appt at [facility] and about fasting labs before visit. No further assistance needed.

## 2024-03-19 ENCOUNTER — Ambulatory Visit: Payer: Self-pay

## 2024-03-19 NOTE — Telephone Encounter (Signed)
 Patient was scheduled for an appt with PCP on 5/7.

## 2024-03-19 NOTE — Telephone Encounter (Signed)
 Summary: low grade fever/some pain in head   Copied From CRM [id number]. Reason for Triage: patient stated she has a low grade fever and some pain in her hea          Chief Complaint: headache Symptoms: low grade fever Frequency: intermittent Pertinent Negatives: Patient denies cough Disposition: [] ED /[] Urgent Care (no appt availability in office) / [] Appointment(In office/virtual)/ []  [facility]/ [] Home Care/ [] Refused Recommended Disposition /[] [facility]/ [x]  Follow-up with PCP Additional Notes: Pt reports headache and "not feeling well", states her normal temp is 97.1 and last night it was 98.60F. She took some tylenol  and felt better, but would like to see PCP to make sure she is not coming down with anything.  Reason for Disposition  [1] Fever AND [2] no signs of serious infection or localizing symptoms (all other triage questions negative)  Answer Assessment - Initial Assessment Questions 1. TEMPERATURE: "What is the most recent temperature?"  "How was it measured?"      Low grade (for her)   2. ONSET: "When did the fever start?"      7 or 8 pm yesterday  3. CHILLS: "Do you have chills?" If yes: "How bad are they?"  (e.g., none, mild, moderate, severe)   - NONE: no chills   - MILD: feeling cold   - MODERATE: feeling very cold, some shivering (feels better under a thick blanket)   - SEVERE: feeling extremely cold with shaking chills (general body shaking, rigors; even under a thick blanket)      None  4. OTHER SYMPTOMS: "Do you have any other symptoms besides the fever?"  (e.g., abdomen pain, cough, diarrhea, earache, headache, sore throat, urination pain)     Headache, doesn't feel well  5. CAUSE: If there are no symptoms, ask: "What do you think is causing the fever?"      Unsure of cause  6. CONTACTS: "Does anyone else in the family have an infection?"     No  7. TREATMENT: "What have you done so far to treat this fever?" (e.g., medications)      Took tylenol   8. IMMUNOCOMPROMISE: "Do you have of the following: diabetes, HIV positive, splenectomy, cancer chemotherapy, chronic steroid treatment, transplant patient, etc."     No  9. PREGNANCY: "Is there any chance you are pregnant?" "When was your last menstrual period?"     No  10. TRAVEL: "Have you traveled out of the country in the last month?" (e.g., travel history, exposures)       No  Protocols used: [facility]

## 2024-03-23 ENCOUNTER — Other Ambulatory Visit: Payer: Self-pay

## 2024-03-23 ENCOUNTER — Telehealth: Payer: Self-pay

## 2024-03-23 DIAGNOSIS — Z79899 Other long term (current) drug therapy: Secondary | ICD-10-CM

## 2024-03-23 DIAGNOSIS — E7849 Other hyperlipidemia: Secondary | ICD-10-CM

## 2024-03-23 NOTE — Telephone Encounter (Signed)
 Sasha called for labs to be released

## 2024-03-24 ENCOUNTER — Ambulatory Visit: Admitting: Internal Medicine

## 2024-03-24 ENCOUNTER — Encounter: Payer: Self-pay | Admitting: Internal Medicine

## 2024-03-24 VITALS — BP 140/84 | HR 80 | Temp 98.4°F | Ht 65.5 in | Wt 142.0 lb

## 2024-03-24 DIAGNOSIS — E7800 Pure hypercholesterolemia, unspecified: Secondary | ICD-10-CM

## 2024-03-24 DIAGNOSIS — R350 Frequency of micturition: Secondary | ICD-10-CM

## 2024-03-24 DIAGNOSIS — J069 Acute upper respiratory infection, unspecified: Secondary | ICD-10-CM | POA: Diagnosis not present

## 2024-03-24 LAB — URINALYSIS, ROUTINE W REFLEX MICROSCOPIC
Bilirubin Urine: NEGATIVE
Hgb urine dipstick: NEGATIVE
Ketones, ur: NEGATIVE
Leukocytes,Ua: NEGATIVE
Nitrite: NEGATIVE
RBC / HPF: NONE SEEN (ref 0–?)
Specific Gravity, Urine: 1.015 (ref 1.000–1.030)
Total Protein, Urine: NEGATIVE
Urine Glucose: NEGATIVE
Urobilinogen, UA: 0.2 (ref 0.0–1.0)
WBC, UA: NONE SEEN (ref 0–?)
pH: 7.5 (ref 5.0–8.0)

## 2024-03-24 LAB — NMR, LIPOPROFILE
Cholesterol, Total: 158 mg/dL (ref 100–199)
HDL Particle Number: 32.6 umol/L (ref >=30.5)
HDL-C: 51 mg/dL (ref >39)
LDL Particle Number: 1250 nmol/L — ABNORMAL HIGH (ref <1000)
LDL Size: 21.2 nm (ref >20.5)
LDL-C (NIH Calc): 88 mg/dL (ref 0–99)
LP-IR Score: 43 (ref <=45)
Small LDL Particle Number: 653 nmol/L — ABNORMAL HIGH (ref <=527)
Triglycerides: 101 mg/dL (ref 0–149)

## 2024-03-24 LAB — COMPREHENSIVE METABOLIC PANEL WITH GFR
ALT: 12 U/L (ref 0–35)
AST: 20 U/L (ref 0–37)
Albumin: 4.3 g/dL (ref 3.5–5.2)
Alkaline Phosphatase: 73 U/L (ref 39–117)
BUN: 24 mg/dL — ABNORMAL HIGH (ref 6–23)
CO2: 28 meq/L (ref 19–32)
Calcium: 9.5 mg/dL (ref 8.4–10.5)
Chloride: 103 meq/L (ref 96–112)
Creatinine, Ser: 0.75 mg/dL (ref 0.40–1.20)
GFR: 72.89 mL/min (ref >60.00)
Glucose, Bld: 83 mg/dL (ref 70–99)
Potassium: 4.4 meq/L (ref 3.5–5.1)
Sodium: 141 meq/L (ref 135–145)
Total Bilirubin: 0.3 mg/dL (ref 0.2–1.2)
Total Protein: 6.7 g/dL (ref 6.0–8.3)

## 2024-03-24 NOTE — Progress Notes (Signed)
 Established Patient Office Visit     CC/Reason for Visit: URI symptoms  HPI: Teresa Martinez is a 83 y.o. female who is coming in today for the above mentioned reasons.  For the past 6 days has been having a frontal headache, runny nose and postnasal drip.  She believes she had a fever and so scheduled this visit with me.  However her highest measured temperature was 98.6.  She has also been noticing an increased urine frequency.   Past Medical/Surgical History: Past Medical History:  Diagnosis Date   Cervical spine degeneration    C5/C6   Chronic right shoulder pain    GERD (gastroesophageal reflux disease)    Hemiparesis affecting dominant side as late effect of cerebrovascular accident (HCC) 12/07/2014   Hyperlipemia    Hypertension    Menopausal syndrome    Teresa Martinez)   Overactive bladder    Statin intolerance    Stroke [facility]) 2015    Past Surgical History:  Procedure Laterality Date   ABDOMINAL HYSTERECTOMY     1985   BAND HEMORRHOIDECTOMY     2010   CATARACT EXTRACTION, BILATERAL     CHOLECYSTECTOMY     1950   LUMBAR LAMINECTOMY Bilateral 08/03/2018   Dr. Whitman Hampshire, L3 laminotomy, L4-L5 laminectomy with neural foraminal decompression.   PAROTIDECTOMY     30 years ago   TOTAL KNEE ARTHROPLASTY Left 09/10/2022   Procedure: LEFT TOTAL KNEE ARTHROPLASTY;  Surgeon: Arnie Lao, MD;  Location: [facility] OR;  Service: Orthopedics;  Laterality: Left;    Social History:  reports that she has never smoked. She has never used smokeless tobacco. She reports that she does not drink alcohol and does not use drugs.  Allergies: Allergies  Allergen Reactions   Oxycodone  Nausea And Vomiting   Statins Other (See Comments)    Muscle pain and unable to walk Muscle pain and unable to walk   Wasp Venom    Latex Other (See Comments)    Burns her skin    Family History:  Family History  Problem Relation Age of Onset   Heart failure Father     Diabetes Mother    Hypertension Mother    Stroke Mother    Cirrhosis Sister    Diabetes Sister    Hyperlipidemia Brother    Hypertension Brother    Stroke Brother    Diabetes Brother      Current Outpatient Medications:    aspirin  81 MG chewable tablet, Chew 1 tablet (81 mg total) by mouth 2 (two) times daily., Disp: 30 tablet, Rfl: 0   Calcium  Carbonate-Vit D-Min (CALTRATE PLUS PO), Take 1 tablet by mouth daily., Disp: , Rfl:    COMIRNATY syringe, Inject 0.3 mLs into the muscle once., Disp: , Rfl:    ezetimibe  (ZETIA ) 10 MG tablet, TAKE 1 TABLET(10 MG) BY MOUTH DAILY, Disp: 90 tablet, Rfl: 1   fesoterodine  (TOVIAZ ) 4 MG TB24 tablet, TAKE 1 TABLET(4 MG) BY MOUTH DAILY, Disp: 90 tablet, Rfl: 1   meloxicam  (MOBIC ) 7.5 MG tablet, Take 1 tablet (7.5 mg total) by mouth daily., Disp: 30 tablet, Rfl: 0   Multiple Vitamin (MULTIVITAMIN WITH MINERALS) TABS tablet, Take 1 tablet by mouth daily., Disp: , Rfl:    pantoprazole  (PROTONIX ) 40 MG tablet, Take 1 tablet (40 mg total) by mouth daily., Disp: 90 tablet, Rfl: 1   REPATHA  SURECLICK 140 MG/ML SOAJ, INJECT 1 DOSE INTO SKIN EVERY 14 DAYS, Disp: 2 mL, Rfl: 11  valsartan  (DIOVAN ) 160 MG tablet, TAKE 1 TABLET(160 MG) BY MOUTH DAILY, Disp: 90 tablet, Rfl: 1  Review of Systems:  Negative unless indicated in HPI.   Physical Exam: Vitals:   03/24/24 1422  BP: (!) 140/84  Pulse: 80  Temp: 98.4 F (36.9 C)  TempSrc: Oral  SpO2: 96%  Weight: 142 lb (64.4 kg)  Height: 5' 5.5" (1.664 m)    Body mass index is 23.27 kg/m.   Physical Exam Vitals reviewed.  Constitutional:      Appearance: Normal appearance.  HENT:     Right Ear: Tympanic membrane, ear canal and external ear normal.     Left Ear: Tympanic membrane, ear canal and external ear normal.     Mouth/Throat:     Mouth: Mucous membranes are moist.     Pharynx: Oropharynx is clear.  Eyes:     Conjunctiva/sclera: Conjunctivae normal.     Pupils: Pupils are equal, round, and  reactive to light.  Cardiovascular:     Rate and Rhythm: Normal rate and regular rhythm.  Pulmonary:     Effort: Pulmonary effort is normal.     Breath sounds: Normal breath sounds.  Neurological:     Mental Status: She is alert.      Impression and Plan:  Upper respiratory tract infection, unspecified type  Pure hypercholesterolemia -     Comprehensive metabolic panel with GFR; Future  Frequency of urination -     Urinalysis, Routine w reflex microscopic   -Given exam findings, PNA, pharyngitis, ear infection are not likely, hence abx have not been prescribed. -Have advised rest, fluids, OTC antihistamines, cough suppressants and mucinex . -RTC if no improvement in 10-14 days. - Check UA, she is requesting LFTs to be checked today as well.   Time spent:20 minutes reviewing chart, interviewing and examining patient and formulating plan of care.     Marguerita Shih, MD [facility] at [facility]

## 2024-03-25 ENCOUNTER — Encounter: Payer: Self-pay | Admitting: Internal Medicine

## 2024-03-30 ENCOUNTER — Ambulatory Visit: Payer: Self-pay | Admitting: *Deleted

## 2024-03-30 ENCOUNTER — Ambulatory Visit (HOSPITAL_BASED_OUTPATIENT_CLINIC_OR_DEPARTMENT_OTHER): Payer: Medicare PPO | Admitting: Internal Medicine

## 2024-03-30 VITALS — BP 138/92 | HR 78 | Ht 65.5 in | Wt 141.8 lb

## 2024-03-30 DIAGNOSIS — Z8673 Personal history of transient ischemic attack (TIA), and cerebral infarction without residual deficits: Secondary | ICD-10-CM

## 2024-03-30 DIAGNOSIS — T466X5A Adverse effect of antihyperlipidemic and antiarteriosclerotic drugs, initial encounter: Secondary | ICD-10-CM

## 2024-03-30 DIAGNOSIS — E7841 Elevated Lipoprotein(a): Secondary | ICD-10-CM | POA: Diagnosis not present

## 2024-03-30 DIAGNOSIS — M791 Myalgia, unspecified site: Secondary | ICD-10-CM | POA: Diagnosis not present

## 2024-03-30 DIAGNOSIS — E7849 Other hyperlipidemia: Secondary | ICD-10-CM | POA: Diagnosis not present

## 2024-03-30 DIAGNOSIS — E785 Hyperlipidemia, unspecified: Secondary | ICD-10-CM | POA: Diagnosis not present

## 2024-03-30 DIAGNOSIS — T466X5D Adverse effect of antihyperlipidemic and antiarteriosclerotic drugs, subsequent encounter: Secondary | ICD-10-CM | POA: Diagnosis not present

## 2024-03-30 MED ORDER — NEXLIZET 180-10 MG PO TABS: 1.0000 | ORAL_TABLET | Freq: Every day | ORAL | Status: DC

## 2024-03-30 NOTE — Progress Notes (Signed)
 LIPID CLINIC CONSULT NOTE  Chief Complaint:  Follow-up dyslipidemia  Primary Care Physician: Zilphia Hilt, Charyl Coppersmith, MD  Primary Cardiologist:  None  HPI:  Teresa Martinez is a 83 y.o. female who is being seen today for the evaluation of dyslipidemia at the request of Zilphia Hilt, Achilles Achilles*.  This is a pleasant 83 year old female kindly referred for evaluation management of dyslipidemia.  Interestingly she has been very athletic most of her life in fact competing in marathon races even into her 25s.  Unfortunately she had a stroke in 2016 and since then had some mediate issues with hemiparesis but says she has never been quite the same.  She also has a history of hypertension, dyslipidemia, mild bilateral carotid disease and some cerebrovascular atherosclerosis based on CT imaging.  She has been intolerant to statins and reports a strong family history of high cholesterol in both of her twin daughters, her aunt, her brother and other family members.  Initial blood pressure was 162/94 however she said it is typically running around 130/78.  Lipids were reassessed in March showing total cholesterol 244, HDL 55, HDL 172 and triglycerides 86.  She has been on ezetimibe  but she says it does not seem to be effective for her.  Previously she had taken statins which caused her severe side effects.  She had tried 3 different statins with her prior primary care provider to cause significant inability to walk and she said she would not ever take them again.  08/23/2021  Teresa Martinez returns today for follow-up.  She is done well on Repatha .  She seems to be tolerating however does have some injection site redness.  She said initially after the first several doses it was not an issue however she has now developed some that may last up to about a week.  She says it is itchy and tender but tolerable.  Her cholesterol though has responded nicely.  Her total cholesterol is now 168, HDL 61,  LDL 89 and triglycerides 98.  Her LDL has come down from 172.  Particle number was [id number].  Her LP(a) is elevated and probably was implicated in her stroke.  I suspect this number is now about 20 to 30% lower than it was previously, at 235.  Although she had history of stroke, according to the criteria for the [employer] study for a specific LP(a) lowering medication, she would not qualify because she would have had to have had prior PCI and history of stroke.  01/24/2023  Teresa Martinez returns today for follow-up.  She recently had knee surgery and has had some pain associated with that.  She also lost her husband recently and her brother.  She has been having a lot of stress.  Blood pressure was a little elevated today however came down to 150/90.  This is being monitored by her PCP.  She feels like stress is a factor.  Cholesterol has crept up slightly but still remains reasonably well treated.  Total 173, triglycerides 60, HDL 59 and LDL 102.  She is on Repatha  and ezetimibe  and reports compliance with medications.  03/30/2024  Teresa Martinez is a follow-up.  She called in indicating that she was having some joint and muscle pains and was concerned this might be related to Repatha .  I advised she could take a holiday from that and skip a dose.  She ended up skipping 3 doses but noted no difference in her symptoms.  She then restarted the Repatha  and had  repeat labs.  Her labs are essentially unchanged from prior labs about 2 years ago.  This included an LDL particle number somewhat elevated at 1250, LDL 88, HDL 51 and triglycerides 101.  Small LDL particle number of 653, again above target.  Her goal LDL is less than 70 with a lower particle number.  She is on combination therapy with Repatha  and ezetimibe .  Diet is already fairly healthy and she is active as possible.  PMHx:  Past Medical History:  Diagnosis Date   Cervical spine degeneration    C5/C6   Chronic right shoulder pain    GERD  (gastroesophageal reflux disease)    Hemiparesis affecting dominant side as late effect of cerebrovascular accident (HCC) 12/07/2014   Hyperlipemia    Hypertension    Menopausal syndrome    [name])   Overactive bladder    Statin intolerance    Stroke [facility]) 2015    Past Surgical History:  Procedure Laterality Date   ABDOMINAL HYSTERECTOMY     1985   BAND HEMORRHOIDECTOMY     2010   CATARACT EXTRACTION, BILATERAL     CHOLECYSTECTOMY     1950   LUMBAR LAMINECTOMY Bilateral 08/03/2018   Dr. Whitman Hampshire, L3 laminotomy, L4-L5 laminectomy with neural foraminal decompression.   PAROTIDECTOMY     30 years ago   TOTAL KNEE ARTHROPLASTY Left 09/10/2022   Procedure: LEFT TOTAL KNEE ARTHROPLASTY;  Surgeon: Arnie Lao, MD;  Location: [facility] OR;  Service: Orthopedics;  Laterality: Left;    FAMHx:  Family History  Problem Relation Age of Onset   Heart failure Father    Diabetes Mother    Hypertension Mother    Stroke Mother    Cirrhosis Sister    Diabetes Sister    Hyperlipidemia Brother    Hypertension Brother    Stroke Brother    Diabetes Brother     SOCHx:   reports that she has never smoked. She has never used smokeless tobacco. She reports that she does not drink alcohol and does not use drugs.  ALLERGIES:  Allergies  Allergen Reactions   Oxycodone  Nausea And Vomiting   Statins Other (See Comments)    Muscle pain and unable to walk Muscle pain and unable to walk   Wasp Venom    Latex Other (See Comments)    Burns her skin    ROS: Pertinent items noted in HPI and remainder of comprehensive ROS otherwise negative.  HOME MEDS: Current Outpatient Medications on File Prior to Visit  Medication Sig Dispense Refill   aspirin  81 MG chewable tablet Chew 1 tablet (81 mg total) by mouth 2 (two) times daily. 30 tablet 0   Calcium  Carbonate-Vit D-Min (CALTRATE PLUS PO) Take 1 tablet by mouth daily.     COMIRNATY syringe Inject 0.3 mLs into the muscle once.      ezetimibe  (ZETIA ) 10 MG tablet TAKE 1 TABLET(10 MG) BY MOUTH DAILY 90 tablet 1   fesoterodine  (TOVIAZ ) 4 MG TB24 tablet TAKE 1 TABLET(4 MG) BY MOUTH DAILY 90 tablet 1   meloxicam  (MOBIC ) 7.5 MG tablet Take 1 tablet (7.5 mg total) by mouth daily. 30 tablet 0   Multiple Vitamin (MULTIVITAMIN WITH MINERALS) TABS tablet Take 1 tablet by mouth daily.     pantoprazole  (PROTONIX ) 40 MG tablet Take 1 tablet (40 mg total) by mouth daily. 90 tablet 1   REPATHA  SURECLICK 140 MG/ML SOAJ INJECT 1 DOSE INTO SKIN EVERY 14 DAYS 2 mL 11   valsartan  (DIOVAN )  160 MG tablet TAKE 1 TABLET(160 MG) BY MOUTH DAILY 90 tablet 1   No current facility-administered medications on file prior to visit.    LABS/IMAGING: No results found for this or any previous visit (from the past 48 hours). No results found.  LIPID PANEL:    Component Value Date/Time   CHOL 158 09/10/2023 0931   CHOL 173 01/17/2023 0921   TRIG 130.0 09/10/2023 0931   HDL 54.80 09/10/2023 0931   HDL 59 01/17/2023 0921   CHOLHDL 3 09/10/2023 0931   VLDL 26.0 09/10/2023 0931   LDLCALC 78 09/10/2023 0931   LDLCALC 102 (H) 01/17/2023 0921   LDLCALC 142 (H) 07/26/2020 1009   LDLDIRECT 205.1 07/30/2013 0946    WEIGHTS: Wt Readings from Last 3 Encounters:  03/30/24 141 lb 12.8 oz (64.3 kg)  03/24/24 142 lb (64.4 kg)  01/15/24 142 lb 9.6 oz (64.7 kg)    VITALS: BP (!) 138/92   Pulse 78   Ht 5' 5.5" (1.664 m)   Wt 141 lb 12.8 oz (64.3 kg)   SpO2 98%   BMI 23.24 kg/m   EXAM: Deferred  EKG: Deferred  ASSESSMENT: Probable familial hyperlipidemia History of stroke (2016) Mild bilateral carotid disease-ultrasound in 2015 Hypertension Family history of high cholesterol and multiple family members and twin daughters Elevated LP(a)-235  PLAN: 1.   Ms. Martinez continues to have a dyslipidemia above goal LDL less than 70.  Partially this may be due to elevated LP(a) however she already is on Repatha .  She also takes ezetimibe .   Unfortunately she cannot take statins.  I would like to drive her cholesterol lower since has been essentially unchanged in the past 2 years on therapy.  We may be able to accomplish this with Nexlizet in place of her ezetimibe .  I provided her with some samples today.  This is tolerated we will go ahead and get prior authorization and plan repeat lipids in about 4 months.  Hazle Lites, MD, [facility], FNLA, FACP  [place]  [facility]  Medical Director of the Advanced Lipid Disorders &  Cardiovascular Risk Reduction Clinic Diplomate of the American Board of Clinical Lipidology Attending Cardiologist  Direct Dial: [phone]  Fax: [phone]  Website:  [email or web address] Stepehn Eckard 03/30/2024, 11:29 AM

## 2024-03-30 NOTE — Patient Instructions (Addendum)
 Medication Instructions:  STOP Zetia  START NEXLIZET daily    The [employer] offers assistance to help pay for medication copays.  They will cover copays for all cholesterol lowering meds, including statins, fibrates, omega-3 fish oils like Vascepa, ezetimibe , Repatha , Praluent, Nexletol, Nexlizet.  The cards are usually good for $2,500 or 12 months, whichever comes first. Our fax # is [phone] (you will need this to apply) Go to healthwellfoundation.org Click on "Apply Now" Answer questions as to whom is applying (patient or representative) Your disease fund will be "hypercholesterolemia - Medicare access" They will ask questions about finances and which medications you are taking for cholesterol When you submit, the approval is usually within minutes.  You will need to print the card information from the site You will need to show this information to your pharmacy, they will bill your Medicare Part D plan first -then bill [employer] --for the copay.   You can also call them at [phone], although the hold times can be quite long.    *If you need a refill on your cardiac medications before your next appointment, please call your pharmacy*  Lab Work: FASTING LIPID- NMR in 4 months (September 2025) If you have labs (blood work) drawn today and your tests are completely normal, you will receive your results only by: MyChart Message (if you have MyChart) OR A paper copy in the mail If you have any lab test that is abnormal or we need to change your treatment, we will call you to review the results.   Follow-Up: At [facility], you and your health needs are our priority.  As part of our continuing mission to provide you with exceptional heart care, our providers are all part of one team.  This team includes your primary Cardiologist (physician) and Advanced Practice Providers or APPs (Physician Assistants and Nurse Practitioners) who all work together to provide you  with the care you need, when you need it.  Your next appointment:   Dr. Maximo Spar after lab work

## 2024-04-26 ENCOUNTER — Other Ambulatory Visit (INDEPENDENT_AMBULATORY_CARE_PROVIDER_SITE_OTHER): Payer: Self-pay

## 2024-04-26 ENCOUNTER — Ambulatory Visit: Admitting: Orthopaedic Surgery

## 2024-04-26 DIAGNOSIS — M25551 Pain in right hip: Secondary | ICD-10-CM

## 2024-04-26 NOTE — Progress Notes (Signed)
 The patient is an 83 year old patient well-known to us .  She has had knee replacement in the past.  She comes in with a chief complaint of right hip pain.  She does ambit with a cane.  She has had some lumbar spine stuff going on but she states that is really in her hip joint and not her back.  She complains of right hip groin pain.  Examination of her right hip shows stiffness with internal and external rotation of the right hip and pain in the groin with rotation.  The left hip exam is entirely normal with smooth rotation of the left hip and no pain.  Standing AP pelvis and lateral right hip shows moderate arthritis of the right hip with superolateral joint space narrowing, flattening of the femoral head and superolateral osteophytes around the lateral femoral head neck.  The left hip looks more normal on x-ray findings.  I explained her x-ray findings.  She does wish to stay with conservative treatment so we will see about setting her up for an intra-articular image guided steroid injection in her right hip joint by one of my partners.  She understands follow-up at that injection can be as needed and less it starts worsening in terms of her pain and she would like to consider hip replacement surgery.  She agrees with this treatment plan.

## 2024-05-07 ENCOUNTER — Ambulatory Visit: Admitting: Sports Medicine

## 2024-05-07 ENCOUNTER — Other Ambulatory Visit: Payer: Self-pay

## 2024-05-07 ENCOUNTER — Encounter: Payer: Self-pay | Admitting: Sports Medicine

## 2024-05-07 DIAGNOSIS — M1611 Unilateral primary osteoarthritis, right hip: Secondary | ICD-10-CM | POA: Diagnosis not present

## 2024-05-07 DIAGNOSIS — M25551 Pain in right hip: Secondary | ICD-10-CM

## 2024-05-07 MED ORDER — LIDOCAINE HCL 1 % IJ SOLN
4.0000 mL | INTRAMUSCULAR | Status: AC | PRN
  Administered 2024-05-07: 4 mL

## 2024-05-07 MED ORDER — METHYLPREDNISOLONE ACETATE 40 MG/ML IJ SUSP
40.0000 mg | INTRAMUSCULAR | Status: AC | PRN
  Administered 2024-05-07: 40 mg via INTRA_ARTICULAR

## 2024-05-07 NOTE — Progress Notes (Signed)
   Procedure Note  Patient: Teresa Martinez             Date of Birth: 19-Apr-1941           MRN: [id number]             Visit Date: 05/07/2024  Procedures: Visit Diagnoses:  1. Unilateral primary osteoarthritis, right hip   2. Pain in right hip    Large Joint Inj: R hip joint on 05/07/2024 11:08 AM Indications: pain Details: 22 G 3.5 in needle, ultrasound-guided anterior approach Medications: 4 mL lidocaine  1 %; 40 mg methylPREDNISolone  acetate 40 MG/ML Outcome: tolerated well, no immediate complications  Procedure: US -guided intra-articular hip injection, Right After discussion on risks/benefits/indications and informed verbal consent was obtained, a timeout was performed. Patient was lying supine on exam table. The hip was cleaned with betadine  and alcohol swabs . Then utilizing ultrasound guidance, the patient's femoral head and neck junction was identified and subsequently injected with 4:1 lidocaine :depomedrol via an in-plane approach with ultrasound visualization of the injectate administered into the hip joint. Patient tolerated procedure well without immediate complications.  Procedure, treatment alternatives, risks and benefits explained, specific risks discussed. Consent was given by the patient. Immediately prior to procedure a time out was called to verify the correct patient, procedure, equipment, support staff and site/side marked as required. Patient was prepped and draped in the usual sterile fashion.     - patient tolerated procedure well, discussed post-injection protocol - follow-up with Dr. Lucienne Ryder as indicated; I am happy to her back as needed - discussed infrequent nature of injections, can repeat if desires after 3 months  Shauna Del, DO Primary Care Sports Medicine Physician  [facility] - Orthopedics  This note was dictated using Dragon naturally speaking software and may contain errors in syntax, spelling, or content which have not been  identified prior to signing this note.

## 2024-06-04 ENCOUNTER — Other Ambulatory Visit: Payer: Self-pay | Admitting: Pharmacist Clinician (PhC)/ Clinical Pharmacy Specialist

## 2024-06-04 MED ORDER — REPATHA SURECLICK 140 MG/ML ~~LOC~~ SOAJ: 140.0000 mg | SUBCUTANEOUS | 3 refills | Status: AC

## 2024-06-17 ENCOUNTER — Telehealth: Payer: Self-pay | Admitting: Internal Medicine

## 2024-06-17 NOTE — Telephone Encounter (Signed)
*  STAT* If patient is at the pharmacy, call can be transferred to refill team.   1. Which medications need to be refilled? (please list name of each medication and dose if known) Bempedoic Acid-Ezetimibe  (NEXLIZET ) 180-10 MG TABS    2. Would you like to learn more about the convenience, safety, & potential cost savings by using the [facility]?      3. Are you open to using the [facility] (Type [facility].  ).   4. Which pharmacy/location (including street and city if local pharmacy) is medication to be sent to? [id number] - [place], [place] - [address] AT [facility] [address]    5. Do they need a 30 day or 90 day supply? 90 day

## 2024-06-18 ENCOUNTER — Telehealth: Payer: Self-pay | Admitting: Pharmacy Technician

## 2024-06-18 ENCOUNTER — Other Ambulatory Visit: Payer: Self-pay

## 2024-06-18 MED ORDER — NEXLIZET 180-10 MG PO TABS: 1.0000 | ORAL_TABLET | Freq: Every day | ORAL | 2 refills | Status: AC

## 2024-06-18 NOTE — Telephone Encounter (Signed)
 RX sent to requested Pharmacy

## 2024-06-18 NOTE — Telephone Encounter (Signed)
   Pharmacy Patient Advocate Encounter  Received notification from [employer] that Prior Authorization for nexlizet  has been APPROVED from 11/19/23 to 11/17/24   PA #/Case ID/Reference #: [id number]

## 2024-06-21 ENCOUNTER — Encounter: Payer: Self-pay | Admitting: Orthopaedic Surgery

## 2024-06-21 ENCOUNTER — Ambulatory Visit: Admitting: Orthopaedic Surgery

## 2024-06-21 DIAGNOSIS — M25551 Pain in right hip: Secondary | ICD-10-CM | POA: Diagnosis not present

## 2024-06-21 DIAGNOSIS — M1611 Unilateral primary osteoarthritis, right hip: Secondary | ICD-10-CM | POA: Diagnosis not present

## 2024-06-21 NOTE — Progress Notes (Signed)
 The patient is a 83 year old active female very well-known to us .  She has well-documented bone-on-bone arthritis of her right hip.  We have actually replaced her left knee remotely in the past.  She does ambulate using a cane.  She has worked on activity modification and taken anti-inflammatories.  She has rested her right hip and worked on hip strengthening exercises.  She has even had an intra-articular injection of the right hip that did not help.  She is at the point where her right hip pain is daily and it is detrimentally affecting her mobility, her quality of life and her actives daily living to the point she does wish to proceed with a hip replacement on the right side and we agree with this as well.  On exam her right hip has significant pain with any attempts of internal and external rotation and there is quite a bit of stiffness with rotation of the right hip.  Her left hip moves smoothly and fluidly.  Previous x-rays of the right hip were read reviewed showing bone-on-bone arthritis of the right hip with complete loss of joint space and osteophytes around the right hip.  I did show her hip replacement model and explained in length in detail what hip replacement surgery involves.  I gave her handout about hip replacement surgery as well.  We discussed the risks and benefits of surgery and what to expect from an intraoperative and postoperative standpoint.  She is not a diabetic and not on blood thinning medications.  I was able to review her past medical history and medications within epic.  She would like to have the surgery sometime in the second week of September so we will see if we can accommodate this for family purposes as well with having her daughter down here.  Will work on scheduling surgery and be in touch with scheduling her for a right total hip arthroplasty.  All questions and concerns were addressed and answered.

## 2024-06-28 ENCOUNTER — Other Ambulatory Visit: Payer: Self-pay | Admitting: Internal Medicine

## 2024-06-28 DIAGNOSIS — I10 Essential (primary) hypertension: Secondary | ICD-10-CM

## 2024-07-08 ENCOUNTER — Telehealth: Payer: Self-pay | Admitting: *Deleted

## 2024-07-08 NOTE — Telephone Encounter (Signed)
 Spoke with patient had she is having hip surgery 07/30/24.  Her physical is on 08/20/24.  She would like to know if she can be worked in before January if she needs to reschedule due to her surgery?

## 2024-07-08 NOTE — Telephone Encounter (Signed)
 Copied from CRM #[id number]. Topic: General - Other >> Jul 08, 2024  2:24 PM Dedra B wrote: Reason for CRM: Pt wants to notify her provider that she is having hip replacement surgery in a few weeks. She would like the nurse to call her.

## 2024-07-12 NOTE — Progress Notes (Signed)
 Sent message, via epic in basket, requesting orders in epic from Careers adviser.

## 2024-07-13 ENCOUNTER — Encounter: Payer: Self-pay | Admitting: Internal Medicine

## 2024-07-13 ENCOUNTER — Other Ambulatory Visit: Payer: Self-pay | Admitting: Physician Assistant

## 2024-07-13 DIAGNOSIS — Z01818 Encounter for other preprocedural examination: Secondary | ICD-10-CM

## 2024-07-13 NOTE — Telephone Encounter (Signed)
 Error

## 2024-07-14 NOTE — Telephone Encounter (Signed)
 Spoke to patient today- 0/27/25- got patient scheduled for her physical.

## 2024-07-16 NOTE — Patient Instructions (Signed)
 SURGICAL WAITING ROOM VISITATION  Patients having surgery or a procedure may have no more than 2 support people in the waiting area - these visitors may rotate.    Children under the age of 65 must have an adult with them who is not the patient.  Visitors with respiratory illnesses are discouraged from visiting and should remain at home.  If the patient needs to stay at the hospital during part of their recovery, the visitor guidelines for inpatient rooms apply. Pre-op nurse will coordinate an appropriate time for 1 support person to accompany patient in pre-op.  This support person may not rotate.    Please refer to the [facility] website for the visitor guidelines for Inpatients (after your surgery is over and you are in a regular room).       Your procedure is scheduled on: 07-30-24   Report to [facility] Main Entrance    Report to admitting at        10:00  AM   Call this number if you have problems the morning of surgery [phone]   Do not eat food :After Midnight.   After Midnight you may have the following liquids until _0930_____ AM/ DAY OF SURGERY    then nothing by mouth  Water Non-Citrus Juices (without pulp, NO RED-Apple, White grape, White cranberry) Black Coffee (NO MILK/CREAM OR CREAMERS, sugar ok)  Clear Tea (NO MILK/CREAM OR CREAMERS, sugar ok) regular and decaf                             Plain Jell-O (NO RED)                                           Fruit ices (not with fruit pulp, NO RED)                                     Popsicles (NO RED)                                                               Sports drinks like Gatorade (NO RED)                    The day of surgery:  Drink ONE (1) Pre-Surgery Clear Ensure  BY    0930 AM the morning of surgery. Drink in one sitting. Do not sip.  This drink was given to you during your hospital  pre-op appointment visit. Nothing else to drink after completing the  Pre-Surgery Clear Ensure .           If you have questions, please contact your surgeon's office.   FOLLOW  ANY ADDITIONAL PRE OP INSTRUCTIONS YOU RECEIVED FROM YOUR SURGEON'S OFFICE!!!     Oral Hygiene is also important to reduce your risk of infection.                                    Remember - BRUSH YOUR  TEETH THE MORNING OF SURGERY WITH YOUR REGULAR TOOTHPASTE  DENTURES WILL BE REMOVED PRIOR TO SURGERY PLEASE DO NOT APPLY Poly grip OR ADHESIVES!!!   Do NOT smoke after Midnight   Stop all vitamins and herbal supplements 7 days before surgery.   Take these medicines the morning of surgery with A SIP OF WATER: Pantoprazole , Toviaz                                  You may not have any metal on your body including hair pins, jewelry, and body piercing             Do not wear make-up, lotions, powders, perfumes/cologne, or deodorant  Do not wear nail polish including gel and S&S, artificial/acrylic nails, or any other type of covering on natural nails including finger and toenails. If you have artificial nails, gel coating, etc. that needs to be removed by a nail salon please have this removed prior to surgery or surgery may need to be canceled/ delayed if the surgeon/ anesthesia feels like they are unable to be safely monitored.   Do not shave  48 hours prior to surgery.           Do not bring valuables to the hospital. [facility] IS NOT             RESPONSIBLE   FOR VALUABLES.   Contacts, glasses, dentures or bridgework may not be worn into surgery.   Bring small overnight bag day of surgery.   DO NOT BRING YOUR HOME MEDICATIONS TO THE HOSPITAL. PHARMACY WILL DISPENSE MEDICATIONS LISTED ON YOUR MEDICATION LIST TO YOU DURING YOUR ADMISSION IN THE HOSPITAL!    Patients discharged on the day of surgery will not be allowed to drive home.  Someone NEEDS to stay with you for the first 24 hours after anesthesia.   Special Instructions: Bring a copy of your healthcare power of attorney and living will  documents the day of surgery if you haven't scanned them before.              Please read over the following fact sheets you were given: IF YOU HAVE QUESTIONS ABOUT YOUR PRE-OP INSTRUCTIONS PLEASE CALL [phone].    If you test positive for Covid or have been in contact with anyone that has tested positive in the last 10 days please notify you surgeon.      Pre-operative 5 CHG Bath Instructions   You can play a key role in reducing the risk of infection after surgery. Your skin needs to be as free of germs as possible. You can reduce the number of germs on your skin by washing with CHG (chlorhexidine  gluconate) soap before surgery. CHG is an antiseptic soap that kills germs and continues to kill germs even after washing.   DO NOT use if you have an allergy to chlorhexidine /CHG or antibacterial soaps. If your skin becomes reddened or irritated, stop using the CHG and notify one of our RNs at [phone].   Please shower with the CHG soap starting 4 days before surgery using the following schedule:     Please keep in mind the following:  DO NOT shave, including legs and underarms, starting the day of your first shower.   You may shave your face at any point before/day of surgery.  Place clean sheets on your bed the day you start using CHG soap. Use a clean washcloth (not  used since being washed) for each shower. DO NOT sleep with pets once you start using the CHG.   CHG Shower Instructions:  If you choose to wash your hair and private area, wash first with your normal shampoo/soap.  After you use shampoo/soap, rinse your hair and body thoroughly to remove shampoo/soap residue.  Turn the water OFF and apply about 3 tablespoons (45 ml) of CHG soap to a CLEAN washcloth.  Apply CHG soap ONLY FROM YOUR NECK DOWN TO YOUR TOES (washing for 3-5 minutes)  DO NOT use CHG soap on face, private areas, open wounds, or sores.  Pay special attention to the area where your surgery is being performed.   If you are having back surgery, having someone wash your back for you may be helpful. Wait 2 minutes after CHG soap is applied, then you may rinse off the CHG soap.  Pat dry with a clean towel  Put on clean clothes/pajamas   If you choose to wear lotion, please use ONLY the CHG-compatible lotions on the back of this paper.     Additional instructions for the day of surgery: DO NOT APPLY any lotions, deodorants, cologne, or perfumes.   Put on clean/comfortable clothes.  Brush your teeth.  Ask your nurse before applying any prescription medications to the skin.      CHG Compatible Lotions   Aveeno Moisturizing lotion  Cetaphil Moisturizing Cream  Cetaphil Moisturizing Lotion  Clairol Herbal Essence Moisturizing Lotion, Dry Skin  Clairol Herbal Essence Moisturizing Lotion, Extra Dry Skin  Clairol Herbal Essence Moisturizing Lotion, Normal Skin  Curel Age Defying Therapeutic Moisturizing Lotion with Alpha Hydroxy  Curel Extreme Care Body Lotion  Curel Soothing Hands Moisturizing Hand Lotion  Curel Therapeutic Moisturizing Cream, Fragrance-Free  Curel Therapeutic Moisturizing Lotion, Fragrance-Free  Curel Therapeutic Moisturizing Lotion, Original Formula  Eucerin Daily Replenishing Lotion  Eucerin Dry Skin Therapy Plus Alpha Hydroxy Crme  Eucerin Dry Skin Therapy Plus Alpha Hydroxy Lotion  Eucerin Original Crme  Eucerin Original Lotion  Eucerin Plus Crme Eucerin Plus Lotion  Eucerin TriLipid Replenishing Lotion  Keri Anti-Bacterial Hand Lotion  Keri Deep Conditioning Original Lotion Dry Skin Formula Softly Scented  Keri Deep Conditioning Original Lotion, Fragrance Free Sensitive Skin Formula  Keri Lotion Fast Absorbing Fragrance Free Sensitive Skin Formula  Keri Lotion Fast Absorbing Softly Scented Dry Skin Formula  Keri Original Lotion  Keri Skin Renewal Lotion Keri Silky Smooth Lotion  Keri Silky Smooth Sensitive Skin Lotion  Nivea Body Creamy Conditioning Oil  Nivea  Body Extra Enriched Teacher, adult education Moisturizing Lotion Nivea Crme  Nivea Skin Firming Lotion  NutraDerm 30 Skin Lotion  NutraDerm Skin Lotion  NutraDerm Therapeutic Skin Cream  NutraDerm Therapeutic Skin Lotion  ProShield Protective Hand Cream  WHAT IS A BLOOD TRANSFUSION? Blood Transfusion Information  A transfusion is the replacement of blood or some of its parts. Blood is made up of multiple cells which provide different functions. Red blood cells carry oxygen and are used for blood loss replacement. White blood cells fight against infection. Platelets control bleeding. Plasma helps clot blood. Other blood products are available for specialized needs, such as hemophilia or other clotting disorders. BEFORE THE TRANSFUSION  Who gives blood for transfusions?  Healthy volunteers who are fully evaluated to make sure their blood is safe. This is blood bank blood. Transfusion therapy is the safest it has ever been in the practice of medicine. Before blood is taken from a donor,  a complete history is taken to make sure that person has no history of diseases nor engages in risky social behavior (examples are intravenous drug use or sexual activity with multiple partners). The donor's travel history is screened to minimize risk of transmitting infections, such as malaria. The donated blood is tested for signs of infectious diseases, such as HIV and hepatitis. The blood is then tested to be sure it is compatible with you in order to minimize the chance of a transfusion reaction. If you or a relative donates blood, this is often done in anticipation of surgery and is not appropriate for emergency situations. It takes many days to process the donated blood. RISKS AND COMPLICATIONS Although transfusion therapy is very safe and saves many lives, the main dangers of transfusion include:  Getting an infectious disease. Developing a transfusion reaction. This is an  allergic reaction to something in the blood you were given. Every precaution is taken to prevent this. The decision to have a blood transfusion has been considered carefully by your caregiver before blood is given. Blood is not given unless the benefits outweigh the risks. AFTER THE TRANSFUSION Right after receiving a blood transfusion, you will usually feel much better and more energetic. This is especially true if your red blood cells have gotten low (anemic). The transfusion raises the level of the red blood cells which carry oxygen, and this usually causes an energy increase. The nurse administering the transfusion will monitor you carefully for complications. HOME CARE INSTRUCTIONS  No special instructions are needed after a transfusion. You may find your energy is better. Speak with your caregiver about any limitations on activity for underlying diseases you may have. SEEK MEDICAL CARE IF:  Your condition is not improving after your transfusion. You develop redness or irritation at the intravenous (IV) site. SEEK IMMEDIATE MEDICAL CARE IF:  Any of the following symptoms occur over the next 12 hours: Shaking chills. You have a temperature by mouth above 102 F (38.9 C), not controlled by medicine. Chest, back, or muscle pain. People around you feel you are not acting correctly or are confused. Shortness of breath or difficulty breathing. Dizziness and fainting. You get a rash or develop hives. You have a decrease in urine output. Your urine turns a dark color or changes to pink, red, or brown. Any of the following symptoms occur over the next 10 days: You have a temperature by mouth above 102 F (38.9 C), not controlled by medicine. Shortness of breath. Weakness after normal activity. The white part of the eye turns yellow (jaundice). You have a decrease in the amount of urine or are urinating less often. Your urine turns a dark color or changes to pink, red, or brown. Document  Released: 11/01/2000 Document Revised: 01/27/2012 Document Reviewed: 06/20/2008 Exit[employer] Patient Information 2014 [place], [place].  _______________________________________________________________________  Incentive Spirometer  An incentive spirometer is a tool that can help keep your lungs clear and active. This tool measures how well you are filling your lungs with each breath. Taking long deep breaths may help reverse or decrease the chance of developing breathing (pulmonary) problems (especially infection) following: A long period of time when you are unable to move or be active. BEFORE THE PROCEDURE  If the spirometer includes an indicator to show your best effort, your nurse or respiratory therapist will set it to a desired goal. If possible, sit up straight or lean slightly forward. Try not to slouch. Hold the incentive spirometer in an upright position. INSTRUCTIONS  FOR USE  Sit on the edge of your bed if possible, or sit up as far as you can in bed or on a chair. Hold the incentive spirometer in an upright position. Breathe out normally. Place the mouthpiece in your mouth and seal your lips tightly around it. Breathe in slowly and as deeply as possible, raising the piston or the ball toward the top of the column. Hold your breath for 3-5 seconds or for as long as possible. Allow the piston or ball to fall to the bottom of the column. Remove the mouthpiece from your mouth and breathe out normally. Rest for a few seconds and repeat Steps 1 through 7 at least 10 times every 1-2 hours when you are awake. Take your time and take a few normal breaths between deep breaths. The spirometer may include an indicator to show your best effort. Use the indicator as a goal to work toward during each repetition. After each set of 10 deep breaths, practice coughing to be sure your lungs are clear. If you have an incision (the cut made at the time of surgery), support your incision when coughing by  placing a pillow or rolled up towels firmly against it. Once you are able to get out of bed, walk around indoors and cough well. You may stop using the incentive spirometer when instructed by your caregiver.  RISKS AND COMPLICATIONS Take your time so you do not get dizzy or light-headed. If you are in pain, you may need to take or ask for pain medication before doing incentive spirometry. It is harder to take a deep breath if you are having pain. AFTER USE Rest and breathe slowly and easily. It can be helpful to keep track of a log of your progress. Your caregiver can provide you with a simple table to help with this. If you are using the spirometer at home, follow these instructions: SEEK MEDICAL CARE IF:  You are having difficultly using the spirometer. You have trouble using the spirometer as often as instructed. Your pain medication is not giving enough relief while using the spirometer. You develop fever of 100.5 F (38.1 C) or higher. SEEK IMMEDIATE MEDICAL CARE IF:  You cough up bloody sputum that had not been present before. You develop fever of 102 F (38.9 C) or greater. You develop worsening pain at or near the incision site. MAKE SURE YOU:  Understand these instructions. Will watch your condition. Will get help right away if you are not doing well or get worse. Document Released: 03/17/2007 Document Revised: 01/27/2012 Document Reviewed: 05/18/2007 [facility] Patient Information 2014 [place], [place].   ________________________________________________________________________

## 2024-07-20 ENCOUNTER — Other Ambulatory Visit: Payer: Self-pay | Admitting: *Deleted

## 2024-07-20 ENCOUNTER — Other Ambulatory Visit: Payer: Self-pay

## 2024-07-20 ENCOUNTER — Encounter (HOSPITAL_COMMUNITY): Payer: Self-pay

## 2024-07-20 ENCOUNTER — Encounter (HOSPITAL_COMMUNITY)
Admission: RE | Admit: 2024-07-20 | Discharge: 2024-07-20 | Disposition: A | Discharge status: Home / Self Care | Source: Ambulatory Visit | Attending: Orthopaedic Surgery | Admitting: Orthopaedic Surgery

## 2024-07-20 VITALS — BP 148/86 | HR 79 | Temp 98.5°F | Resp 16 | Ht 66.0 in | Wt 134.0 lb

## 2024-07-20 DIAGNOSIS — Z8673 Personal history of transient ischemic attack (TIA), and cerebral infarction without residual deficits: Secondary | ICD-10-CM | POA: Insufficient documentation

## 2024-07-20 DIAGNOSIS — I10 Essential (primary) hypertension: Secondary | ICD-10-CM | POA: Insufficient documentation

## 2024-07-20 DIAGNOSIS — E7849 Other hyperlipidemia: Secondary | ICD-10-CM

## 2024-07-20 DIAGNOSIS — Z01818 Encounter for other preprocedural examination: Secondary | ICD-10-CM | POA: Diagnosis not present

## 2024-07-20 DIAGNOSIS — M1611 Unilateral primary osteoarthritis, right hip: Secondary | ICD-10-CM | POA: Insufficient documentation

## 2024-07-20 HISTORY — DX: Unspecified osteoarthritis, unspecified site: M19.90

## 2024-07-20 HISTORY — DX: Asymptomatic varicose veins of unspecified lower extremity: I83.90

## 2024-07-20 LAB — BASIC METABOLIC PANEL WITH GFR
Anion gap: 13 (ref 5–15)
BUN: 20 mg/dL (ref 8–23)
CO2: 24 mmol/L (ref 22–32)
Calcium: 10.1 mg/dL (ref 8.9–10.3)
Chloride: 105 mmol/L (ref 98–111)
Creatinine, Ser: 0.81 mg/dL (ref 0.44–1.00)
GFR, Estimated: >60 mL/min (ref >60)
Glucose, Bld: 96 mg/dL (ref 70–99)
Potassium: 4.3 mmol/L (ref 3.5–5.1)
Sodium: 142 mmol/L (ref 135–145)

## 2024-07-20 LAB — CBC
HCT: 44.6 % (ref 36.0–46.0)
Hemoglobin: 14.1 g/dL (ref 12.0–15.0)
MCH: 25.6 pg — ABNORMAL LOW (ref 26.0–34.0)
MCHC: 31.6 g/dL (ref 30.0–36.0)
MCV: 81.1 fL (ref 80.0–100.0)
Platelets: 283 K/uL (ref 150–400)
RBC: 5.5 MIL/uL — ABNORMAL HIGH (ref 3.87–5.11)
RDW: 14.6 % (ref 11.5–15.5)
WBC: 6.5 K/uL (ref 4.0–10.5)
nRBC: 0 % (ref 0.0–0.2)

## 2024-07-20 LAB — SURGICAL PCR SCREEN
MRSA, PCR: NEGATIVE
Staphylococcus aureus: NEGATIVE

## 2024-07-20 NOTE — Progress Notes (Addendum)
 Anesthesia Review:  PCP: Theophilus Andrews LOV 03/24/24  Cardiologist : Vinie Maxcy LOV 03/30/24   PPM/ ICD: Device Orders: Rep Notified:  Chest x-ray : EKG :  07/20/24  Echo : 2015  Stress test: Cardiac Cath :   Activity level: can do a flight of stairs iwthout difficutly  Sleep Study/ CPAP : none  Fasting Blood Sugar :      / Checks Blood Sugar -- times a day:    Blood Thinner/ Instructions /Last Dose: ASA / Instructions/ Last Dose :    325 mg aspirin  - PT states at preop on 07/20/24 that she stopped approx on 07/13/2024 on her own per pt.

## 2024-07-22 NOTE — Progress Notes (Signed)
 Anesthesia Chart Review   Case: [id number] Date/Time: 07/30/24 1215   Procedure: ARTHROPLASTY, HIP, TOTAL, ANTERIOR APPROACH (Right: Hip)   Anesthesia type: Spinal   Diagnosis: Primary osteoarthritis of right hip [M16.11]   Pre-op diagnosis: osteoarthritis right hip   Location: [facility] ROOM 09 / [facility]   Surgeons: Vernetta Lonni GRADE, MD       DISCUSSION:83 y.o. never smoker with h/o HTN, stroke 2016, right hip OA scheduled for above procedure 07/30/24 with Dr. Lonni Vernetta.   Pt follows with cardiology for dyslipidemia. Last seen 03/30/2024. Stable at this visit. Pt can climb a flight of stairs without difficulty, denies chest pain or shortness of breath. VS: BP (!) 148/86   Pulse 79   Temp 36.9 C (Oral)   Resp 16   Ht 5' 6 (1.676 m)   Wt 60.8 kg   SpO2 97%   BMI 21.63 kg/m   PROVIDERS: Theophilus Andrews, Tully GRADE, MD is PCP   Mona Kent, MD is Cardiologist  LABS: Labs reviewed: Acceptable for surgery. (all labs ordered are listed, but only abnormal results are displayed)  Labs Reviewed  CBC - Abnormal; Notable for the following components:      Result Value   RBC 5.50 (*)    MCH 25.6 (*)    All other components within normal limits  SURGICAL PCR SCREEN  BASIC METABOLIC PANEL WITH GFR  TYPE AND SCREEN     IMAGES:   EKG:   CV: Echo 10/15/2014 - Left ventricle: The cavity size was normal. Wall thickness was    normal. Systolic function was normal. The estimated ejection    fraction was in the range of 55% to 60%. Wall motion was normal;    there were no regional wall motion abnormalities. Features are    consistent with a pseudonormal left ventricular filling pattern,    with concomitant abnormal relaxation and increased filling    pressure (grade 2 diastolic dysfunction).  - Atrial septum: There was redundancy of the septum, with    borderline criteria for aneurysm.  Past Medical History:  Diagnosis Date   Arthritis    Cervical spine  degeneration    C5/C6   Chronic right shoulder pain    GERD (gastroesophageal reflux disease)    Hemiparesis affecting dominant side as late effect of cerebrovascular accident (HCC) 12/07/2014   Hyperlipemia    Hypertension    Menopausal syndrome    [name]   Overactive bladder    Statin intolerance    Stroke (HCC) 2015   weakness on right side   Varicose vein of leg    left leg    Past Surgical History:  Procedure Laterality Date   ABDOMINAL HYSTERECTOMY     1985   BAND HEMORRHOIDECTOMY     2010   CATARACT EXTRACTION, BILATERAL     CHOLECYSTECTOMY     1950   LUMBAR LAMINECTOMY Bilateral 08/03/2018   Dr. Wallene, L3 laminotomy, L4-L5 laminectomy with neural foraminal decompression.   PAROTIDECTOMY     30 years ago   TOTAL KNEE ARTHROPLASTY Left 09/10/2022   Procedure: LEFT TOTAL KNEE ARTHROPLASTY;  Surgeon: Vernetta Lonni GRADE, MD;  Location: [facility] OR;  Service: Orthopedics;  Laterality: Left;    MEDICATIONS:  aspirin  EC 325 MG tablet   Bempedoic Acid-Ezetimibe  (NEXLIZET ) 180-10 MG TABS   Calcium  Carbonate-Vit D-Min (CALTRATE PLUS PO)   carboxymethylcellulose (REFRESH PLUS) 0.5 % SOLN   Evolocumab  (REPATHA  SURECLICK) 140 MG/ML SOAJ   fesoterodine  (TOVIAZ ) 4 MG TB24  tablet   Magnesium  250 MG TABS   Multiple Vitamin (MULTIVITAMIN WITH MINERALS) TABS tablet   Omega-3 Fatty Acids (FISH OIL ULTRA) 1400 MG CAPS   pantoprazole  (PROTONIX ) 40 MG tablet   valsartan  (DIOVAN ) 160 MG tablet   No current facility-administered medications for this encounter.      Harlene Hoots Ward, PA-C [facility] Pre-Surgical Testing [phone]

## 2024-07-29 ENCOUNTER — Telehealth: Payer: Self-pay | Admitting: *Deleted

## 2024-07-29 DIAGNOSIS — M1611 Unilateral primary osteoarthritis, right hip: Principal | ICD-10-CM | POA: Insufficient documentation

## 2024-07-29 NOTE — H&P (Signed)
 TOTAL HIP ADMISSION H&P  Patient is admitted for right total hip arthroplasty.  Subjective:  Chief Complaint: right hip pain  HPI: Teresa Martinez, 83 y.o. female, has a history of pain and functional disability in the right hip(s) due to arthritis and patient has failed non-surgical conservative treatments for greater than 12 weeks to include NSAID's and/or analgesics, use of assistive devices, and activity modification.  Onset of symptoms was gradual starting a few years ago with gradually worsening course since that time.The patient noted no past surgery on the right hip(s).  Patient currently rates pain in the right hip at 10 out of 10 with activity. Patient has night pain, worsening of pain with activity and weight bearing, pain that interfers with activities of daily living, and pain with passive range of motion. Patient has evidence of subchondral sclerosis, periarticular osteophytes, and joint space narrowing by imaging studies. This condition presents safety issues increasing the risk of falls.  There is no current active infection.  Patient Active Problem List   Diagnosis Date Noted   Unilateral primary osteoarthritis, right hip 07/29/2024   Status post left knee replacement 09/10/2022   IGT (impaired glucose tolerance) 07/27/2021   Preop testing 07/09/2018   Degenerative lumbar spinal stenosis 05/12/2018   Statin myopathy 08/29/2016   Iliotibial band syndrome of right side 04/23/2016   Acromioclavicular arthrosis 06/30/2015   Adhesive capsulitis of right shoulder 02/06/2015   Dysarthria due to cerebrovascular accident 01/09/2015   Hemiparesis affecting dominant side as late effect of cerebrovascular accident (HCC) 12/07/2014   Spastic neurogenic bladder 10/31/2014   Right rotator cuff tendonitis 10/19/2014   Stenosis of cervical spine region    Cerebral infarction due to thrombosis of left middle cerebral artery (HCC)    Left pontine stroke (HCC)    TIA (transient  ischemic attack) 10/14/2014   Overactive bladder 10/14/2014   H/O: CVA (cerebrovascular accident) 10/14/2014   Essential hypertension 10/10/2009   INSOMNIA 09/15/2009   Pure hypercholesterolemia 11/28/2008   MENOPAUSAL SYNDROME 11/28/2008   Past Medical History:  Diagnosis Date   Arthritis    Cervical spine degeneration    C5/C6   Chronic right shoulder pain    GERD (gastroesophageal reflux disease)    Hemiparesis affecting dominant side as late effect of cerebrovascular accident (HCC) 12/07/2014   Hyperlipemia    Hypertension    Menopausal syndrome    [name]   Overactive bladder    Statin intolerance    Stroke (HCC) 2015   weakness on right side   Varicose vein of leg    left leg    Past Surgical History:  Procedure Laterality Date   ABDOMINAL HYSTERECTOMY     1985   BAND HEMORRHOIDECTOMY     2010   CATARACT EXTRACTION, BILATERAL     CHOLECYSTECTOMY     1950   LUMBAR LAMINECTOMY Bilateral 08/03/2018   Dr. Wallene, L3 laminotomy, L4-L5 laminectomy with neural foraminal decompression.   PAROTIDECTOMY     30 years ago   TOTAL KNEE ARTHROPLASTY Left 09/10/2022   Procedure: LEFT TOTAL KNEE ARTHROPLASTY;  Surgeon: Vernetta Lonni GRADE, MD;  Location: [facility] OR;  Service: Orthopedics;  Laterality: Left;    No current facility-administered medications for this encounter.   Current Outpatient Medications  Medication Sig Dispense Refill Last Dose/Taking   aspirin  EC 325 MG tablet Take 325 mg by mouth at bedtime.   Taking   Bempedoic Acid-Ezetimibe  (NEXLIZET ) 180-10 MG TABS Take 1 tablet by mouth daily. (Patient taking differently:  Take 1 tablet by mouth at bedtime.) 90 tablet 2 Taking Differently   Calcium  Carbonate-Vit D-Min (CALTRATE PLUS PO) Take 1 tablet by mouth in the morning.   Taking   carboxymethylcellulose (REFRESH PLUS) 0.5 % SOLN Place 1-2 drops into both eyes 3 (three) times daily as needed (dry/irritated eyes.).   Taking As Needed   Evolocumab   (REPATHA  SURECLICK) 140 MG/ML SOAJ Inject 140 mg into the skin every 14 (fourteen) days. 6 mL 3 Taking   fesoterodine  (TOVIAZ ) 4 MG TB24 tablet TAKE 1 TABLET(4 MG) BY MOUTH DAILY 90 tablet 1 Taking   Magnesium  250 MG TABS Take 250 mg by mouth in the morning.   Taking   Multiple Vitamin (MULTIVITAMIN WITH MINERALS) TABS tablet Take 1 tablet by mouth daily. Centrum   Taking   Omega-3 Fatty Acids (FISH OIL ULTRA) 1400 MG CAPS Take 1,400 mg by mouth in the morning.   Taking   pantoprazole  (PROTONIX ) 40 MG tablet Take 1 tablet (40 mg total) by mouth daily. 90 tablet 1 Taking   valsartan  (DIOVAN ) 160 MG tablet TAKE 1 TABLET(160 MG) BY MOUTH DAILY (Patient taking differently: Take 160 mg by mouth at bedtime.) 90 tablet 1 Taking Differently   Allergies  Allergen Reactions   Oxycodone  Nausea And Vomiting   Statins Other (See Comments)    Muscle pain and unable to walk Muscle pain and unable to walk   Wasp Venom    Latex Other (See Comments)    Burns her skin    Social History   Tobacco Use   Smoking status: Never   Smokeless tobacco: Never  Substance Use Topics   Alcohol  use: No    Family History  Problem Relation Age of Onset   Heart failure Father    Diabetes Mother    Hypertension Mother    Stroke Mother    Cirrhosis Sister    Diabetes Sister    Hyperlipidemia Brother    Hypertension Brother    Stroke Brother    Diabetes Brother      Review of Systems  Objective:  Physical Exam Vitals reviewed.  Constitutional:      Appearance: Normal appearance. She is normal weight.  HENT:     Head: Normocephalic and atraumatic.  Eyes:     Extraocular Movements: Extraocular movements intact.     Pupils: Pupils are equal, round, and reactive to light.  Cardiovascular:     Rate and Rhythm: Normal rate.  Pulmonary:     Effort: Pulmonary effort is normal.  Abdominal:     Palpations: Abdomen is soft.  Musculoskeletal:     Cervical back: Normal range of motion and neck supple.      Right hip: Tenderness and bony tenderness present. Decreased range of motion. Decreased strength.  Neurological:     Mental Status: She is alert and oriented to person, place, and time.  Psychiatric:        Behavior: Behavior normal.     Vital signs in last 24 hours:    Labs:   Estimated body mass index is 21.63 kg/m as calculated from the following:   Height as of 07/20/24: 5' 6 (1.676 m).   Weight as of 07/20/24: 60.8 kg.   Imaging Review Plain radiographs demonstrate severe degenerative joint disease of the right hip(s). The bone quality appears to be good for age and reported activity level.      Assessment/Plan:  End stage arthritis, right hip(s)  The patient history, physical examination, clinical judgement of the  provider and imaging studies are consistent with end stage degenerative joint disease of the right hip(s) and total hip arthroplasty is deemed medically necessary. The treatment options including medical management, injection therapy, arthroscopy and arthroplasty were discussed at length. The risks and benefits of total hip arthroplasty were presented and reviewed. The risks due to aseptic loosening, infection, stiffness, dislocation/subluxation,  thromboembolic complications and other imponderables were discussed.  The patient acknowledged the explanation, agreed to proceed with the plan and consent was signed. Patient is being admitted for inpatient treatment for surgery, pain control, PT, OT, prophylactic antibiotics, VTE prophylaxis, progressive ambulation and ADL's and discharge planning.The patient is planning to be discharged home with home health services

## 2024-07-29 NOTE — Care Plan (Signed)
 [facility] RNCM call to patient to discuss her upcoming Right Total hip replacement with Dr. Vernetta on 07/30/24 at [facility]. She is agreeable to case management. She has a daughter that will be assisting her at home after discharge. She has a RW already. No other needed DME. Anticipate HHPT will be needed after short hospital stay. Referral made to [facility] after choice provided. Reviewed post op care instructions and will continue to follow for needs.

## 2024-07-29 NOTE — Telephone Encounter (Signed)
 [facility] pre-op  call completed for upcoming right total hip arthroplasty.

## 2024-07-29 NOTE — Anesthesia Preprocedure Evaluation (Signed)
 Anesthesia Evaluation  Patient identified by MRN, date of birth, ID band Patient awake    Reviewed: Allergy & Precautions, NPO status , Patient's Chart, lab work & pertinent test results  History of Anesthesia Complications Negative for: history of anesthetic complications  Airway Mallampati: III   Neck ROM: Full    Dental  (+) Implants Lower molar chipped:   Pulmonary sleep apnea and Continuous Positive Airway Pressure Ventilation , neg COPD, Patient abstained from smoking.Not current smoker   Pulmonary exam normal breath sounds clear to auscultation       Cardiovascular Exercise Tolerance: Good METShypertension, (-) CAD and (-) Past MI Normal cardiovascular exam(-) dysrhythmias  Rhythm:Regular Rate:Normal     Neuro/Psych negative neurological ROS  negative psych ROS   GI/Hepatic ,GERD  ,,(+)     (-) substance abuse    Endo/Other  diabetes, Type 2Hypothyroidism    Renal/GU Renal disease (nephrolithiasis)negative Renal ROS     Musculoskeletal  (+) Arthritis ,    Abdominal   Peds  Hematology negative hematology ROS (+)   Anesthesia Other Findings Past Medical History: No date: Arthritis No date: B12 deficiency No date: Bladder polyps No date: Diverticulitis No date: GERD (gastroesophageal reflux disease) No date: History of blood in urine No date: History of chickenpox No date: History of [place] measles No date: History of kidney stones No date: History of mumps No date: Hypertension No date: Hypothyroidism No date: IBS (irritable bowel syndrome) No date: LPRD (laryngopharyngeal reflux disease) No date: Sleep apnea     Comment:  CPAP  Reproductive/Obstetrics                              Anesthesia Physical Anesthesia Plan  ASA: 3  Anesthesia Plan: MAC   Post-op Pain Management: Minimal or no pain anticipated   Induction: Intravenous  PONV Risk Score and Plan: 2 and  TIVA, Midazolam  and Treatment may vary due to age or medical condition  Airway Management Planned: Nasal Cannula  Additional Equipment:   Intra-op Plan:   Post-operative Plan:   Informed Consent: I have reviewed the patients History and Physical, chart, labs and discussed the procedure including the risks, benefits and alternatives for the proposed anesthesia with the patient or authorized representative who has indicated his/her understanding and acceptance.       Plan Discussed with: CRNA  Anesthesia Plan Comments: (Explained risks of anesthesia, including PONV, and rare emergencies such as cardiac events, respiratory problems, and allergic reactions, requiring invasive intervention. Discussed the role of CRNA in patient's perioperative care. Patient understands. )         Anesthesia Quick Evaluation

## 2024-07-30 ENCOUNTER — Ambulatory Visit (HOSPITAL_BASED_OUTPATIENT_CLINIC_OR_DEPARTMENT_OTHER): Payer: Self-pay | Admitting: Anesthesiology

## 2024-07-30 ENCOUNTER — Observation Stay (HOSPITAL_COMMUNITY)

## 2024-07-30 ENCOUNTER — Other Ambulatory Visit: Payer: Self-pay

## 2024-07-30 ENCOUNTER — Ambulatory Visit (HOSPITAL_COMMUNITY): Payer: Self-pay | Admitting: Physician Assistant

## 2024-07-30 ENCOUNTER — Encounter (HOSPITAL_COMMUNITY)
Admission: RE | Disposition: A | Discharge status: Home / Self Care | Payer: Self-pay | Source: Ambulatory Visit | Attending: Orthopaedic Surgery

## 2024-07-30 ENCOUNTER — Observation Stay (HOSPITAL_COMMUNITY)
Admission: RE | Admit: 2024-07-30 | Discharge: 2024-08-02 | Disposition: A | Discharge status: Home / Self Care | Source: Ambulatory Visit | Attending: Orthopaedic Surgery | Admitting: Orthopaedic Surgery

## 2024-07-30 ENCOUNTER — Encounter (HOSPITAL_COMMUNITY): Payer: Self-pay | Admitting: Orthopaedic Surgery

## 2024-07-30 ENCOUNTER — Ambulatory Visit (HOSPITAL_COMMUNITY)

## 2024-07-30 DIAGNOSIS — E7800 Pure hypercholesterolemia, unspecified: Secondary | ICD-10-CM | POA: Diagnosis not present

## 2024-07-30 DIAGNOSIS — Z79899 Other long term (current) drug therapy: Secondary | ICD-10-CM | POA: Insufficient documentation

## 2024-07-30 DIAGNOSIS — Z96652 Presence of left artificial knee joint: Secondary | ICD-10-CM | POA: Insufficient documentation

## 2024-07-30 DIAGNOSIS — M1611 Unilateral primary osteoarthritis, right hip: Secondary | ICD-10-CM | POA: Diagnosis not present

## 2024-07-30 DIAGNOSIS — I878 Other specified disorders of veins: Secondary | ICD-10-CM | POA: Diagnosis not present

## 2024-07-30 DIAGNOSIS — Z8673 Personal history of transient ischemic attack (TIA), and cerebral infarction without residual deficits: Secondary | ICD-10-CM | POA: Insufficient documentation

## 2024-07-30 DIAGNOSIS — I10 Essential (primary) hypertension: Secondary | ICD-10-CM | POA: Diagnosis not present

## 2024-07-30 DIAGNOSIS — Z96641 Presence of right artificial hip joint: Secondary | ICD-10-CM

## 2024-07-30 DIAGNOSIS — I679 Cerebrovascular disease, unspecified: Secondary | ICD-10-CM

## 2024-07-30 DIAGNOSIS — M25551 Pain in right hip: Secondary | ICD-10-CM | POA: Diagnosis present

## 2024-07-30 DIAGNOSIS — Z91040 Latex allergy status: Secondary | ICD-10-CM | POA: Diagnosis not present

## 2024-07-30 DIAGNOSIS — Z7982 Long term (current) use of aspirin: Secondary | ICD-10-CM | POA: Diagnosis not present

## 2024-07-30 HISTORY — PX: TOTAL HIP ARTHROPLASTY: SHX124

## 2024-07-30 LAB — ABO/RH: ABO/RH(D): B NEG

## 2024-07-30 LAB — TYPE AND SCREEN
ABO/RH(D): B NEG
Antibody Screen: NEGATIVE

## 2024-07-30 LAB — GLUCOSE, CAPILLARY: Glucose-Capillary: 58 mg/dL — ABNORMAL LOW (ref 70–99)

## 2024-07-30 SURGERY — ARTHROPLASTY, HIP, TOTAL, ANTERIOR APPROACH
Anesthesia: Monitor Anesthesia Care | Site: Hip | Laterality: Right

## 2024-07-30 MED ORDER — IRBESARTAN 150 MG PO TABS
150.0000 mg | ORAL_TABLET | Freq: Every day | ORAL | Status: DC
  Administered 2024-07-30 – 2024-08-01 (×3): 150 mg via ORAL
  Filled 2024-07-30 (×3): qty 1

## 2024-07-30 MED ORDER — MORPHINE SULFATE (PF) 2 MG/ML IV SOLN: 0.5000 mg | INTRAVENOUS | Status: DC | PRN

## 2024-07-30 MED ORDER — BUPIVACAINE IN DEXTROSE 0.75-8.25 % IT SOLN
INTRATHECAL | Status: DC | PRN
  Administered 2024-07-30: 1.6 mL via INTRATHECAL

## 2024-07-30 MED ORDER — DIPHENHYDRAMINE HCL 12.5 MG/5ML PO ELIX
12.5000 mg | ORAL_SOLUTION | ORAL | Status: DC | PRN
  Administered 2024-07-30 – 2024-07-31 (×2): 25 mg via ORAL
  Filled 2024-07-30 (×2): qty 10

## 2024-07-30 MED ORDER — FENTANYL CITRATE PF 50 MCG/ML IJ SOSY: 25.0000 ug | PREFILLED_SYRINGE | INTRAMUSCULAR | Status: DC | PRN

## 2024-07-30 MED ORDER — POLYVINYL ALCOHOL 1.4 % OP SOLN: 1.0000 [drp] | Freq: Three times a day (TID) | OPHTHALMIC | Status: DC | PRN

## 2024-07-30 MED ORDER — ASPIRIN 81 MG PO CHEW
81.0000 mg | CHEWABLE_TABLET | Freq: Two times a day (BID) | ORAL | Status: DC
  Administered 2024-07-30 – 2024-08-02 (×6): 81 mg via ORAL
  Filled 2024-07-30 (×7): qty 1

## 2024-07-30 MED ORDER — TRANEXAMIC ACID-NACL 1000-0.7 MG/100ML-% IV SOLN
1000.0000 mg | INTRAVENOUS | Status: AC
Start: 2024-07-30 — End: 2024-07-30
  Administered 2024-07-30: 1000 mg via INTRAVENOUS
  Filled 2024-07-30: qty 100

## 2024-07-30 MED ORDER — DOCUSATE SODIUM 100 MG PO CAPS
100.0000 mg | ORAL_CAPSULE | Freq: Two times a day (BID) | ORAL | Status: DC
  Administered 2024-07-30 – 2024-08-02 (×6): 100 mg via ORAL
  Filled 2024-07-30 (×6): qty 1

## 2024-07-30 MED ORDER — 0.9 % SODIUM CHLORIDE (POUR BTL) OPTIME
TOPICAL | Status: DC | PRN
  Administered 2024-07-30: 1000 mL

## 2024-07-30 MED ORDER — METOCLOPRAMIDE HCL 5 MG PO TABS: 5.0000 mg | ORAL_TABLET | Freq: Three times a day (TID) | ORAL | Status: DC | PRN

## 2024-07-30 MED ORDER — METHOCARBAMOL 500 MG PO TABS
500.0000 mg | ORAL_TABLET | Freq: Four times a day (QID) | ORAL | Status: DC | PRN
  Administered 2024-07-30: 500 mg via ORAL
  Filled 2024-07-30: qty 1

## 2024-07-30 MED ORDER — ORAL CARE MOUTH RINSE: 15.0000 mL | Freq: Once | OROMUCOSAL | Status: AC

## 2024-07-30 MED ORDER — ALUM & MAG HYDROXIDE-SIMETH 200-200-20 MG/5ML PO SUSP: 30.0000 mL | ORAL | Status: DC | PRN

## 2024-07-30 MED ORDER — PANTOPRAZOLE SODIUM 40 MG PO TBEC
40.0000 mg | DELAYED_RELEASE_TABLET | Freq: Every day | ORAL | Status: DC
Start: 2024-07-31 — End: 2024-08-02
  Administered 2024-07-31 – 2024-08-02 (×3): 40 mg via ORAL
  Filled 2024-07-30 (×3): qty 1

## 2024-07-30 MED ORDER — HYDROCODONE-ACETAMINOPHEN 5-325 MG PO TABS: 1.0000 | ORAL_TABLET | ORAL | Status: DC | PRN

## 2024-07-30 MED ORDER — SODIUM CHLORIDE 0.9 % IV SOLN
INTRAVENOUS | Status: DC
Start: 2024-07-30 — End: 2024-08-02

## 2024-07-30 MED ORDER — ACETAMINOPHEN 325 MG PO TABS: 325.0000 mg | ORAL_TABLET | Freq: Four times a day (QID) | ORAL | Status: DC | PRN

## 2024-07-30 MED ORDER — OXYCODONE HCL 5 MG PO TABS: 5.0000 mg | ORAL_TABLET | Freq: Once | ORAL | Status: DC | PRN

## 2024-07-30 MED ORDER — PROPOFOL 10 MG/ML IV BOLUS
INTRAVENOUS | Status: DC | PRN
Start: 2024-07-30 — End: 2024-07-30
  Administered 2024-07-30: 30 mg via INTRAVENOUS

## 2024-07-30 MED ORDER — METOCLOPRAMIDE HCL 5 MG/ML IJ SOLN: 5.0000 mg | Freq: Three times a day (TID) | INTRAMUSCULAR | Status: DC | PRN

## 2024-07-30 MED ORDER — HYDROCODONE-ACETAMINOPHEN 7.5-325 MG PO TABS: 1.0000 | ORAL_TABLET | ORAL | Status: DC | PRN

## 2024-07-30 MED ORDER — METHOCARBAMOL 1000 MG/10ML IJ SOLN: 500.0000 mg | Freq: Four times a day (QID) | INTRAMUSCULAR | Status: DC | PRN

## 2024-07-30 MED ORDER — MAGNESIUM OXIDE -MG SUPPLEMENT 400 (240 MG) MG PO TABS
200.0000 mg | ORAL_TABLET | Freq: Every day | ORAL | Status: DC
  Administered 2024-08-01 – 2024-08-02 (×2): 200 mg via ORAL
  Filled 2024-07-30 (×3): qty 1

## 2024-07-30 MED ORDER — PHENOL 1.4 % MT LIQD: 1.0000 | OROMUCOSAL | Status: DC | PRN

## 2024-07-30 MED ORDER — ONDANSETRON HCL 4 MG/2ML IJ SOLN
4.0000 mg | Freq: Four times a day (QID) | INTRAMUSCULAR | Status: DC | PRN
  Administered 2024-07-30: 4 mg via INTRAVENOUS
  Filled 2024-07-30 (×2): qty 2

## 2024-07-30 MED ORDER — OXYCODONE HCL 5 MG/5ML PO SOLN: 5.0000 mg | Freq: Once | ORAL | Status: DC | PRN

## 2024-07-30 MED ORDER — ONDANSETRON HCL 4 MG/2ML IJ SOLN: 4.0000 mg | Freq: Once | INTRAMUSCULAR | Status: DC | PRN

## 2024-07-30 MED ORDER — STERILE WATER FOR IRRIGATION IR SOLN
Status: DC | PRN
  Administered 2024-07-30: 2000 mL

## 2024-07-30 MED ORDER — EZETIMIBE 10 MG PO TABS
10.0000 mg | ORAL_TABLET | Freq: Every day | ORAL | Status: DC
  Administered 2024-07-30 – 2024-08-01 (×3): 10 mg via ORAL
  Filled 2024-07-30 (×3): qty 1

## 2024-07-30 MED ORDER — TRAMADOL HCL 50 MG PO TABS
50.0000 mg | ORAL_TABLET | Freq: Four times a day (QID) | ORAL | Status: DC | PRN
  Administered 2024-07-30 – 2024-07-31 (×2): 50 mg via ORAL
  Administered 2024-08-01: 100 mg via ORAL
  Administered 2024-08-01: 50 mg via ORAL
  Filled 2024-07-30 (×2): qty 1
  Filled 2024-07-30: qty 2
  Filled 2024-07-30 (×3): qty 1

## 2024-07-30 MED ORDER — MENTHOL 3 MG MT LOZG: 1.0000 | LOZENGE | OROMUCOSAL | Status: DC | PRN

## 2024-07-30 MED ORDER — PHENYLEPHRINE HCL-NACL 20-0.9 MG/250ML-% IV SOLN
INTRAVENOUS | Status: DC | PRN
  Administered 2024-07-30: 40 ug/min via INTRAVENOUS

## 2024-07-30 MED ORDER — LACTATED RINGERS IV SOLN: INTRAVENOUS | Status: DC

## 2024-07-30 MED ORDER — POVIDONE-IODINE 10 % EX SWAB
2.0000 | Freq: Once | CUTANEOUS | Status: DC
Start: 2024-07-30 — End: 2024-07-30

## 2024-07-30 MED ORDER — ONDANSETRON HCL 4 MG PO TABS: 4.0000 mg | ORAL_TABLET | Freq: Four times a day (QID) | ORAL | Status: DC | PRN

## 2024-07-30 MED ORDER — FESOTERODINE FUMARATE ER 4 MG PO TB24
4.0000 mg | ORAL_TABLET | Freq: Every day | ORAL | Status: DC
  Administered 2024-07-31 – 2024-08-02 (×3): 4 mg via ORAL
  Filled 2024-07-30 (×3): qty 1

## 2024-07-30 MED ORDER — PROPOFOL 500 MG/50ML IV EMUL
INTRAVENOUS | Status: DC | PRN
  Administered 2024-07-30: 50 ug/kg/min via INTRAVENOUS

## 2024-07-30 MED ORDER — CHLORHEXIDINE GLUCONATE 0.12 % MT SOLN
15.0000 mL | Freq: Once | OROMUCOSAL | Status: AC
  Administered 2024-07-30: 15 mL via OROMUCOSAL

## 2024-07-30 MED ORDER — CEFAZOLIN SODIUM-DEXTROSE 2-4 GM/100ML-% IV SOLN
2.0000 g | Freq: Four times a day (QID) | INTRAVENOUS | Status: AC
  Administered 2024-07-30 – 2024-07-31 (×2): 2 g via INTRAVENOUS
  Filled 2024-07-30 (×2): qty 100

## 2024-07-30 MED ORDER — SODIUM CHLORIDE 0.9 % IR SOLN
Status: DC | PRN
  Administered 2024-07-30: 1000 mL

## 2024-07-30 MED ORDER — MEPERIDINE HCL 100 MG/ML IJ SOLN: 6.2500 mg | INTRAMUSCULAR | Status: DC | PRN

## 2024-07-30 MED ORDER — CEFAZOLIN SODIUM-DEXTROSE 2-4 GM/100ML-% IV SOLN
2.0000 g | INTRAVENOUS | Status: AC
  Administered 2024-07-30: 2 g via INTRAVENOUS
  Filled 2024-07-30: qty 100

## 2024-07-30 SURGICAL SUPPLY — 36 items
BAG COUNTER SPONGE SURGICOUNT (BAG) ×1 IMPLANT
BAG ZIPLOCK 12X15 (MISCELLANEOUS) ×1 IMPLANT
BENZOIN TINCTURE PRP APPL 2/3 (GAUZE/BANDAGES/DRESSINGS) IMPLANT
BLADE SAW SGTL 18X1.27X75 (BLADE) ×1 IMPLANT
COVER PERINEAL POST (MISCELLANEOUS) ×1 IMPLANT
COVER SURGICAL LIGHT HANDLE (MISCELLANEOUS) ×1 IMPLANT
DRAPE FOOT SWITCH (DRAPES) ×1 IMPLANT
DRAPE STERI IOBAN 125X83 (DRAPES) ×1 IMPLANT
DRAPE U-SHAPE 47X51 STRL (DRAPES) ×2 IMPLANT
DRSG AQUACEL AG ADV 3.5X10 (GAUZE/BANDAGES/DRESSINGS) ×1 IMPLANT
DURAPREP 26ML APPLICATOR (WOUND CARE) ×1 IMPLANT
ELECT PENCIL ROCKER SW 15FT (MISCELLANEOUS) ×1 IMPLANT
ELECT REM PT RETURN 15FT ADLT (MISCELLANEOUS) ×1 IMPLANT
GAUZE XEROFORM 1X8 LF (GAUZE/BANDAGES/DRESSINGS) IMPLANT
GLOVE BIO SURGEON STRL SZ7.5 (GLOVE) ×1 IMPLANT
GLOVE BIOGEL PI IND STRL 8 (GLOVE) ×2 IMPLANT
GLOVE ECLIPSE 8.0 STRL XLNG CF (GLOVE) ×1 IMPLANT
GOWN STRL REUS W/ TWL XL LVL3 (GOWN DISPOSABLE) ×2 IMPLANT
HEAD M SROM 36MM PLUS 1.5 (Hips) IMPLANT
HOLDER FOLEY CATH W/STRAP (MISCELLANEOUS) ×1 IMPLANT
KIT TURNOVER KIT A (KITS) ×1 IMPLANT
LINER NEUTRAL 52X36MM PLUS 4 (Liner) IMPLANT
PACK ANTERIOR HIP CUSTOM (KITS) ×1 IMPLANT
PIN SECTOR W/GRIP ACE CUP 52MM (Hips) IMPLANT
SET HNDPC FAN SPRY TIP SCT (DISPOSABLE) ×1 IMPLANT
STAPLER SKIN PROX 35W (STAPLE) IMPLANT
STEM FEM ACTIS HIGH SZ3 (Stem) IMPLANT
STRIP CLOSURE SKIN 1/2X4 (GAUZE/BANDAGES/DRESSINGS) IMPLANT
SUT ETHIBOND NAB CT1 #1 30IN (SUTURE) ×1 IMPLANT
SUT ETHILON 2 0 PS N (SUTURE) IMPLANT
SUT MNCRL AB 4-0 PS2 18 (SUTURE) IMPLANT
SUT VIC AB 0 CT1 36 (SUTURE) ×1 IMPLANT
SUT VIC AB 1 CT1 36 (SUTURE) ×1 IMPLANT
SUT VIC AB 2-0 CT1 TAPERPNT 27 (SUTURE) ×2 IMPLANT
TRAY FOLEY MTR SLVR 16FR STAT (SET/KITS/TRAYS/PACK) ×1 IMPLANT
YANKAUER SUCT BULB TIP NO VENT (SUCTIONS) ×1 IMPLANT

## 2024-07-30 NOTE — TOC Transition Note (Signed)
 Transition of Care [facility]) - Discharge Note   Patient Details  Name: Teresa Martinez MRN: [id number] Date of Birth: 12-25-1940  [facility] CM/SW Contact:  NORMAN ASPEN, LCSW Phone Number: 07/30/2024, 3:58 PM   Clinical Narrative:     Met with pt who confirms she has needed DME in the home.  HHPT prearranged with [facility] via ortho MD office prior to surgery.  No further TOC needs.  Final next level of care: Home w Home Health Services Barriers to Discharge: No Barriers Identified   Patient Goals and CMS Choice Patient states their goals for this hospitalization and ongoing recovery are:: return home          Discharge Placement                       Discharge Plan and Services Additional resources added to the After Visit Summary for                  DME Arranged: N/A DME Agency: NA       HH Arranged: PT HH Agency: [facility]        Social Drivers of Health (SDOH) Interventions SDOH Screenings   Food Insecurity: No Food Insecurity (09/10/2023)  Housing: Low Risk  (08/18/2023)  Transportation Needs: No Transportation Needs (08/18/2023)  Utilities: Not At Risk (08/18/2023)  Alcohol  Screen: Low Risk  (08/18/2023)  Depression (PHQ2-9): Low Risk  (01/15/2024)  Financial Resource Strain: Low Risk  (08/18/2023)  Physical Activity: Sufficiently Active (08/18/2023)  Social Connections: Unknown (08/18/2023)  Stress: No Stress Concern Present (08/18/2023)  Tobacco Use: Low Risk  (07/30/2024)  Health Literacy: Adequate Health Literacy (08/18/2023)     Readmission Risk Interventions     No data to display

## 2024-07-30 NOTE — Evaluation (Signed)
 Physical Therapy Evaluation Patient Details Name: Teresa Martinez MRN: [id number] DOB: 1941/10/05 Today's Date: 07/30/2024  History of Present Illness  83 yo female presents to therapy s/p R THA, anterior approach on 07/30/2024 due to failure of conservative measures. 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  Clinical Impression  Teresa Martinez is a 83 y.o. female POD 0 s/p R THA. Patient reports mod I with mobility at baseline. Patient is now limited by functional impairments (see PT problem list below) and requires min A for bed mobility and CGA and cues for transfers. Patient was able to ambulate 25 feet with RW and CGA level of assist. Patient will benefit from continued skilled PT interventions to address impairments and progress towards PLOF. Acute PT will follow to progress mobility and stair training in preparation for safe discharge home with family support and [facility] services.        If plan is discharge home, recommend the following: A little help with walking and/or transfers;A little help with bathing/dressing/bathroom;Assistance with cooking/housework;Assist for transportation;Help with stairs or ramp for entrance   Can travel by private vehicle        Equipment Recommendations None recommended by PT  Recommendations for Other Services       Functional Status Assessment Patient has had a recent decline in their functional status and demonstrates the ability to make significant improvements in function in a reasonable and predictable amount of time.     Precautions / Restrictions Precautions Precautions: Fall Restrictions Weight Bearing Restrictions Per Provider Order: No      Mobility  Bed Mobility Overal bed mobility: Needs Assistance Bed Mobility: Supine to Sit      Supine to sit: Min assist, HOB elevated     General bed mobility comments: cues and min A for trunk with CGA/tactle cues for LEs to EOB    Transfers Overall transfer level: Needs assistance Equipment used: Rolling walker (2 wheels) Transfers: Sit to/from Stand Sit to Stand: Contact guard assist           General transfer comment: min cues pt push with L UE from EOB, pt sates some residual R side weakness, not apparent with mobiltiy at time of eval    Ambulation/Gait Ambulation/Gait assistance: Contact guard assist Gait Distance (Feet): 25 Feet Assistive device: Rolling walker (2 wheels) Gait Pattern/deviations: Step-to pattern, Decreased stance time - right, Antalgic, Trunk flexed Gait velocity: decreased     General Gait Details: slight truk flexion with B UE support at RW with pt offloading R LE in stance phase, pt requested to amb in personal room and demonstrated abiltiy to perform obstacle navigation and turns with min cues  Stairs            Wheelchair Mobility     Tilt Bed    Modified Rankin (Stroke Patients Only)       Balance Overall balance assessment: Needs assistance Sitting-balance support: Feet supported Sitting balance-Leahy Scale: Good     Standing balance support: Bilateral upper extremity supported, During functional activity, Reliant on assistive device for balance Standing balance-Leahy Scale: Fair Standing balance comment: static standing no UE support                             Pertinent Vitals/Pain Pain  Assessment Pain Assessment: 0-10 Pain Score: 4  Pain Location: R hip and LE Pain Descriptors / Indicators: Aching, Constant, Discomfort, Grimacing, Operative site guarding Pain Intervention(s): Limited activity within patient's tolerance, Monitored during session, Premedicated before session, Repositioned, Ice applied    Home Living Family/patient expects to be discharged to:: Private residence Living  Arrangements: Children Available Help at Discharge: Family Type of Home: House Home Access: Stairs to enter Entrance Stairs-Rails: Left Entrance Stairs-Number of Steps: 6   Home Layout: One level Home Equipment: Agricultural consultant (2 wheels);Cane - single point Additional Comments: L knee brace s/p L TKA    Prior Function Prior Level of Function : Independent/Modified Independent;Driving             Mobility Comments: IND with use of SPC for all mobility tasks, ADLs, and self care tasks       Extremity/Trunk Assessment        Lower Extremity Assessment Lower Extremity Assessment: RLE deficits/detail RLE Deficits / Details: ankle DF/PF 5/5 RLE Sensation: WNL    Cervical / Trunk Assessment Cervical / Trunk Assessment: Normal  Communication   Communication Communication: No apparent difficulties    Cognition Arousal: Alert Behavior During Therapy: WFL for tasks assessed/performed   PT - Cognitive impairments: No apparent impairments                         Following commands: Intact       Cueing       General Comments      Exercises     Assessment/Plan    PT Assessment Patient needs continued PT services  PT Problem List Decreased strength;Decreased range of motion;Decreased activity tolerance;Decreased balance;Decreased mobility;Decreased coordination;Pain       PT Treatment Interventions DME instruction;Gait training;Stair training;Functional mobility training;Therapeutic activities;Therapeutic exercise;Balance training;Neuromuscular re-education;Patient/family education;Modalities    PT Goals (Current goals can be found in the Care Plan section)  Acute Rehab PT Goals Patient Stated Goal: to be able to walk without pain PT Goal Formulation: With patient Time For Goal Achievement: 08/13/24 Potential to Achieve Goals: Good    Frequency 7X/week     Co-evaluation               AM-PAC PT 6 Clicks Mobility  Outcome Measure Help  needed turning from your back to your side while in a flat bed without using bedrails?: A Little Help needed moving from lying on your back to sitting on the side of a flat bed without using bedrails?: A Little Help needed moving to and from a bed to a chair (including a wheelchair)?: A Little Help needed standing up from a chair using your arms (e.g., wheelchair or bedside chair)?: A Little Help needed to walk in hospital room?: A Little Help needed climbing 3-5 steps with a railing? : Total 6 Click Score: 16    End of Session Equipment Utilized During Treatment: Gait belt Activity Tolerance: Patient tolerated treatment well Patient left: in chair;with call bell/phone within reach Nurse Communication: Mobility status PT Visit Diagnosis: Unsteadiness on feet (R26.81);Other abnormalities of gait and mobility (R26.89);Muscle weakness (generalized) (M62.81);Difficulty in walking, not elsewhere classified (R26.2);Pain Pain - Right/Left: Right Pain - part of body: Hip;Leg    Time: 8275-8252 PT Time Calculation (min) (ACUTE ONLY): 23 min   Charges:   PT Evaluation $PT Eval Low Complexity: 1 Low PT Treatments $Therapeutic Activity: 8-22 mins PT General Charges $$ ACUTE PT VISIT: 1 Visit  [place], PT Acute Rehab   [place] VEAR Drone 07/30/2024, 6:33 PM

## 2024-07-30 NOTE — Anesthesia Postprocedure Evaluation (Signed)
 Anesthesia Post Note  Patient: Whittney Steenson Mintz-Whitcomb  Procedure(s) Performed: ARTHROPLASTY, HIP, TOTAL, ANTERIOR APPROACH (Right: Hip)     Patient location during evaluation: PACU Anesthesia Type: MAC Level of consciousness: awake and alert Pain management: pain level controlled Vital Signs Assessment: post-procedure vital signs reviewed and stable Respiratory status: spontaneous breathing, nonlabored ventilation, respiratory function stable and patient connected to nasal cannula oxygen Cardiovascular status: stable and blood pressure returned to baseline Postop Assessment: no apparent nausea or vomiting Anesthetic complications: no   No notable events documented.  Last Vitals:  Vitals:   07/30/24 1445 07/30/24 1500  BP: 117/75 (!) 131/91  Pulse: 76 80  Resp: 20 17  Temp:    SpO2: 98% 100%    Last Pain:  Vitals:   07/30/24 1500  TempSrc:   PainSc: 0-No pain                 Kemontae Dunklee

## 2024-07-30 NOTE — Op Note (Signed)
 Operative Note  Date of operation: 07/30/2024 Preoperative diagnosis: Right hip primary osteoarthritis Postoperative diagnosis: Same  Procedure: Right direct anterior total hip arthroplasty  Implants: Implant Name Type Inv. Item Serial No. Manufacturer Lot No. LRB No. Used Action  PIN SECTOR W/GRIP ACE CUP - [id number] Hips PIN SECTOR W/GRIP ACE CUP  [employer] [id number] Right 1 Implanted  LINER NEUTRAL [id number] PLUS 4 - [id number] Liner LINER NEUTRAL [id number] PLUS 4  [employer] [id number] Right 1 Implanted  HEAD M SROM PLUS 1.5 - [id number] Hips HEAD M SROM PLUS 1.5  [employer]PU[employer] [id number] Right 1 Implanted  STEM FEM ACTIS HIGH SZ3 - [id number] Stem STEM FEM ACTIS HIGH SZ3  [employer] [id number] Right 1 Implanted   Surgeon: Lonni GRADE. Vernetta, MD Assistant: Tory Gaskins, PA-C  Anesthesia: Spinal Antibiotics: IV Ancef  EBL: 100 cc Complications: None  Indications: The patient is a 83 year old active female with debilitating arthritis involving her right hip.  This has been well-documented on clinical exam and x-ray findings.  At this point her right hip pain is daily and it is detrimentally affecting her mobility, her quality of life and her actives daily living to the point she does wish to proceed with hip replacement we agree with this as well.  Having had this done before with a left knee replacement she is fully aware of the risks of acute blood loss anemia, nerve vessel injury, fracture, infection, DVT, dislocation, implant failure, leg length differences and wound healing issues.  She understands that our goals are hopefully decreased pain, improved mobility and improve quality of life.  Procedure description: After informed consent was obtained in the appropriate right hip was marked, the patient was brought to the operating room and set up on a stretcher where spinal anesthesia was obtained.  She was then laid in a supine position on the  stretcher and a Foley catheter was placed.  Traction boots were placed on both her feet and then she was placed supine on the Hana fracture table with a perineal post and placed in both legs and inline skeletal traction devices no traction applied.  Her right operative hip and pelvis were assessed radiographically.  The right hip was prepped and draped with DuraPrep and sterile drapes.  A timeout was called and she was identified as correct patient and the correct right hip.  An incision was then made just inferior and posterior to the ASIS and carried slightly obliquely down the leg.  Dissection was carried down to the tensor fascia lata muscle and the tensor fascia was then divided longitudinally to proceed with a direct anterior process of the hip.  Circumflex vessels were identified and cauterized.  The hip capsule identified and opened up in L-type format finding a moderate joint effusion.  Cobra retractors were placed around the medial and lateral femoral neck and a femoral neck cut was made with an oscillating saw just proximal to the lesser trochanter and this cut was completed with an osteotome.  A corkscrew guide was placed in the femoral head and the femoral head was removed in its entirety and there is a widely devoid of cartilage.  A bent Hohmann was then placed over the medial acetabular rim and remnants of the acetabular labrum and other debris removed.  Reaming was then initiated from a size 43 reamer and stepwise increments going up to a 51 reamer with all reamers placed under direct visualization the last reamer also placed under direct fluoroscopy in order  to obtain the depth reaming, the inclination and anteversion.  The real DePuy sector GRIPTION acetabular component size 52 was then placed without difficulty followed by a 36+4 polyethylene liner.  The right leg was then x-ray treated to the 120 degrees, extended and adducted, a Mueller retractor was placed medially and Hohmann tractor behind  the greater trochanter.  The lateral joint capsule was released and a box cutting osteotome was used into the femoral canal.  Broaching was then initiated using the Actis broaching system from a size 0 going up to a size 3.  With a size 3 in place we trialed a standard offset femoral neck and a 36+1.5 trial head ball.  This was reduced in the pelvis after bring to the leg over and up.  Based on radiographic and clinical assessment we felt like we needed a little bit more offset.  We dislocated the hip and remove the trial components.  We then placed the real Actis femoral component size 3 with high offset and with the real 36+1.5 metal head ball.  Again this was reduced into pelvis and we are pleased with radiographic and clinical assessment and stability.  The soft tissue was then irrigated with normal saline solution.  Remnants of the joint capsule were closed with interrupted #1 Ethibond suture followed by #1 Vicryl close tensor fascia.  0 Vicryl used to close the deep tissue and 2-0 Vicryl was used to close subcutaneous tissue.  The skin was closed with staples.  An Aquacel dressing was applied.  The patient was taken off the Hana table and taken recovery room.  Tory Gaskins, PA-C did assist during the entire case and beginning to end and his assistance was crucial and medically necessary for soft tissue management and retraction, helping guide implant placement and a layered closure of the wound.

## 2024-07-30 NOTE — Anesthesia Procedure Notes (Signed)
 Spinal  Patient location during procedure: OR Start time: 07/30/2024 12:05 PM End time: 07/30/2024 12:07 PM Reason for block: surgical anesthesia Staffing Performed: resident/CRNA  Resident/CRNA: [place], Daivion Pape C, CRNA Performed by: [place], Gilberto Stanforth C, CRNA Authorized by: Mallory Manus, MD   Preanesthetic Checklist Completed: patient identified, IV checked, site marked, risks and benefits discussed, surgical consent, monitors and equipment checked, pre-op evaluation and timeout performed Spinal Block Patient position: sitting Prep: DuraPrep and site prepped and draped Patient monitoring: heart rate, cardiac monitor, continuous pulse ox and blood pressure Approach: midline Location: L3-4 Injection technique: single-shot Needle Needle type: Pencan  Needle gauge: 24 G Needle length: 9 cm Assessment Sensory level: T4 Events: CSF return Additional Notes IV functioning, monitors applied to pt. Expiration date of kit checked and confirmed to be in date. Sterile prep and drape, hand hygiene and sterile gloved used. Pt was positioned and spine was prepped in sterile fashion. Skin was anesthetized with lidocaine . Free flow of clear CSF obtained prior to injecting local anesthetic into CSF x 1 attempt. Spinal needle aspirated freely following injection. Needle was carefully withdrawn, and pt tolerated procedure well. Loss of motor and sensory on exam post injection.

## 2024-07-30 NOTE — Transfer of Care (Signed)
 Immediate Anesthesia Transfer of Care Note  Patient: Teresa Martinez  Procedure(s) Performed: ARTHROPLASTY, HIP, TOTAL, ANTERIOR APPROACH (Right: Hip)  Patient Location: PACU  Anesthesia Type:Spinal  Level of Consciousness: awake and drowsy  Airway & Oxygen Therapy: Patient Spontanous Breathing and Patient connected to face mask oxygen  Post-op Assessment: Report given to RN and Post -op Vital signs reviewed and stable  Post vital signs: Reviewed and stable  Last Vitals:  Vitals Value Taken Time  BP 88/50 07/30/24 13:40  Temp    Pulse 59 07/30/24 13:40  Resp 17 07/30/24 13:40  SpO2 100 % 07/30/24 13:40  Vitals shown include unfiled device data.  Last Pain:  Vitals:   07/30/24 1015  TempSrc: Oral  PainSc: 0-No pain      Patients Stated Pain Goal: 4 (07/30/24 1015)  Complications: No notable events documented.

## 2024-07-30 NOTE — Interval H&P Note (Signed)
 History and Physical Interval Note: The patient understands that she is here today for a right total hip replacement to treat her significant right hip pain and arthritis.  There is been no acute or interval change in her medical status.  The risks and benefits of surgery have been discussed in detail and informed consent has been obtained.  The right operative hip has been marked.  07/30/2024 10:54 AM  Teresa Martinez  has presented today for surgery, with the diagnosis of osteoarthritis right hip.  The various methods of treatment have been discussed with the patient and family. After consideration of risks, benefits and other options for treatment, the patient has consented to  Procedure(s): ARTHROPLASTY, HIP, TOTAL, ANTERIOR APPROACH (Right) as a surgical intervention.  The patient's history has been reviewed, patient examined, no change in status, stable for surgery.  I have reviewed the patient's chart and labs.  Questions were answered to the patient's satisfaction.     Lonni [name]

## 2024-07-31 ENCOUNTER — Other Ambulatory Visit: Payer: Self-pay

## 2024-07-31 DIAGNOSIS — Z7982 Long term (current) use of aspirin: Secondary | ICD-10-CM | POA: Diagnosis not present

## 2024-07-31 DIAGNOSIS — Z79899 Other long term (current) drug therapy: Secondary | ICD-10-CM | POA: Diagnosis not present

## 2024-07-31 DIAGNOSIS — Z96652 Presence of left artificial knee joint: Secondary | ICD-10-CM | POA: Diagnosis not present

## 2024-07-31 DIAGNOSIS — Z91040 Latex allergy status: Secondary | ICD-10-CM | POA: Diagnosis not present

## 2024-07-31 DIAGNOSIS — M1611 Unilateral primary osteoarthritis, right hip: Secondary | ICD-10-CM | POA: Diagnosis not present

## 2024-07-31 DIAGNOSIS — Z8673 Personal history of transient ischemic attack (TIA), and cerebral infarction without residual deficits: Secondary | ICD-10-CM | POA: Diagnosis not present

## 2024-07-31 DIAGNOSIS — I10 Essential (primary) hypertension: Secondary | ICD-10-CM | POA: Diagnosis not present

## 2024-07-31 LAB — BASIC METABOLIC PANEL WITH GFR
Anion gap: 14 (ref 5–15)
BUN: 11 mg/dL (ref 8–23)
CO2: 22 mmol/L (ref 22–32)
Calcium: 8.7 mg/dL — ABNORMAL LOW (ref 8.9–10.3)
Chloride: 102 mmol/L (ref 98–111)
Creatinine, Ser: 0.88 mg/dL (ref 0.44–1.00)
GFR, Estimated: >60 mL/min (ref >60)
Glucose, Bld: 139 mg/dL — ABNORMAL HIGH (ref 70–99)
Potassium: 4.1 mmol/L (ref 3.5–5.1)
Sodium: 138 mmol/L (ref 135–145)

## 2024-07-31 LAB — CBC
HCT: 34.2 % — ABNORMAL LOW (ref 36.0–46.0)
Hemoglobin: 10.9 g/dL — ABNORMAL LOW (ref 12.0–15.0)
MCH: 26 pg (ref 26.0–34.0)
MCHC: 31.9 g/dL (ref 30.0–36.0)
MCV: 81.4 fL (ref 80.0–100.0)
Platelets: 217 K/uL (ref 150–400)
RBC: 4.2 MIL/uL (ref 3.87–5.11)
RDW: 14.6 % (ref 11.5–15.5)
WBC: 11.4 K/uL — ABNORMAL HIGH (ref 4.0–10.5)
nRBC: 0 % (ref 0.0–0.2)

## 2024-07-31 LAB — GLUCOSE, CAPILLARY
Glucose-Capillary: 163 mg/dL — ABNORMAL HIGH (ref 70–99)
Glucose-Capillary: 182 mg/dL — ABNORMAL HIGH (ref 70–99)

## 2024-07-31 MED ORDER — ACETAMINOPHEN 325 MG PO TABS
325.0000 mg | ORAL_TABLET | Freq: Four times a day (QID) | ORAL | Status: DC | PRN
  Administered 2024-07-31: 325 mg via ORAL
  Administered 2024-07-31: 650 mg via ORAL
  Administered 2024-07-31 (×2): 325 mg via ORAL
  Administered 2024-08-02: 650 mg via ORAL
  Filled 2024-07-31 (×2): qty 2
  Filled 2024-07-31: qty 1
  Filled 2024-07-31: qty 2
  Filled 2024-07-31: qty 1
  Filled 2024-07-31: qty 2

## 2024-07-31 NOTE — Progress Notes (Signed)
 PT TX NOTE  07/31/24 1500  PT Visit Information  Last PT Received On 07/31/24  Assistance Needed Pt making steady progress this afternoon. Pt able to amb ~ 65' with CGA and improved ability to self assist with all functional tasks. Plan per MD is to stay for another day and likely d/c tomorrow.   History of Present Illness 83 yo female presents to therapy s/p R THA, anterior approach on 07/30/2024 due to failure of conservative measures. 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  Subjective Data  Patient Stated Goal to be able to walk without pain  Precautions  Precautions Fall  Restrictions  Weight Bearing Restrictions Per Provider Order No  Pain Assessment  Pain Assessment 0-10  Pain Score 6  Pain Location R hip and LE  Pain Descriptors / Indicators Aching;Constant;Discomfort;Grimacing;Operative site guarding  Pain Intervention(s) Limited activity within patient's tolerance;Monitored during session;Premedicated before session;Repositioned;Ice applied  Cognition  Arousal Alert  Behavior During Therapy WFL for tasks assessed/performed  PT - Cognitive impairments No apparent impairments  Following Commands  Following commands Intact  Communication  Communication No apparent difficulties  Bed Mobility  Overal bed mobility Needs Assistance  Bed Mobility Supine to Sit;Sit to Supine  Supine to sit Min assist  Sit to supine Min assist  General bed mobility comments cues and min A for trunk with CGA/tactile cues for LEs to EOB. incr time needed  Transfers  Overall transfer level Needs assistance  Equipment used Rolling walker (2 wheels)  Transfers Sit to/from Stand  Sit to Stand Contact guard assist  General transfer comment STS from bed and toilet. ongoing education for LE position and hand placement   Ambulation/Gait  Ambulation/Gait assistance Contact guard assist  Gait Distance (Feet) 60 Feet (10' more)  Assistive device Rolling walker (2 wheels)  Gait Pattern/deviations Step-to pattern;Decreased stance time - right;Antalgic;Trunk flexed  General Gait Details cues for RW position, sequence. distance to tolerance. wt shift to RLE improved  Gait velocity decreased  Balance  Overall balance assessment Needs assistance  Sitting-balance support Feet supported  Sitting balance-Leahy Scale Good  Standing balance support Bilateral upper extremity supported;During functional activity;Reliant on assistive device for balance  Standing balance-Leahy Scale Poor  Total Joint Exercises  Ankle Circles/Pumps AROM;Both;10 reps  Heel Slides AAROM;Right;10 reps  Hip ABduction/ADduction AAROM;Right;10 reps  PT - End of Session  Equipment Utilized During Treatment Gait belt  Activity Tolerance Patient tolerated treatment well  Patient left with call bell/phone within reach;with family/visitor present;in bed;with bed alarm set  Nurse Communication Mobility status   PT - Assessment/Plan  PT Visit Diagnosis Unsteadiness on feet (R26.81);Other abnormalities of gait and mobility (R26.89);Muscle weakness (generalized) (M62.81);Difficulty in walking, not elsewhere classified (R26.2);Pain  Pain - Right/Left Right  Pain - part of body Hip;Leg  PT Frequency (ACUTE ONLY) 7X/week  Follow Up Recommendations Follow physician's recommendations for discharge plan and follow up therapies  Patient can return home with the following A little help with walking and/or transfers;A little help with bathing/dressing/bathroom;Assistance with cooking/housework;Assist for transportation;Help with stairs or ramp for entrance  PT equipment None recommended by PT  AM-PAC PT 6 Clicks Mobility Outcome Measure (Version 2)  Help needed turning from your back to your side while in a flat bed without using bedrails? 3  Help needed  moving from lying on  your back to sitting on the side of a flat bed without using bedrails? 3  Help needed moving to and from a bed to a chair (including a wheelchair)? 3  Help needed standing up from a chair using your arms (e.g., wheelchair or bedside chair)? 3  Help needed to walk in hospital room? 3  Help needed climbing 3-5 steps with a railing?  2  6 Click Score 17  Consider Recommendation of Discharge To: Home with [facility]  Progressive Mobility  What is the highest level of mobility based on the mobility assessment? Level 4 (Ambulates with assistance) - Balance while stepping forward/back - Complete  PT Goal Progression  Progress towards PT goals Progressing toward goals  Acute Rehab PT Goals  PT Goal Formulation With patient  Time For Goal Achievement 08/13/24  Potential to Achieve Goals Good  PT Time Calculation  PT Start Time (ACUTE ONLY) 1400  PT Stop Time (ACUTE ONLY) 1424  PT Time Calculation (min) (ACUTE ONLY) 24 min  PT General Charges  $$ ACUTE PT VISIT 1 Visit  PT Treatments  $Gait Training 23-37 mins

## 2024-07-31 NOTE — Care Management Obs Status (Signed)
 MEDICARE OBSERVATION STATUS NOTIFICATION   Patient Details  Name: Teresa Martinez MRN: [id number] Date of Birth: 1941-11-14   Medicare Observation Status Notification Given:  Yes    Sheri ONEIDA Sharps, LCSW 07/31/2024, 3:58 PM

## 2024-07-31 NOTE — Progress Notes (Signed)
 Hypoglycemic Event  CBG: 58  Treatment: 8 oz juice/soda  Symptoms: Pale and became unable to respond to voice  Follow-up CBG: Time: 0024 CBG Result:163  Possible Reasons for Event: Inadequate meal intake  Comments/MD notified: Call placed to notify on-call provider (Dr. Arlinda) of event, no new orders at this time    Tawni Pouch

## 2024-07-31 NOTE — Progress Notes (Signed)
 Physical Therapy Treatment Patient Details Name: Teresa Martinez MRN: [id number] DOB: 1941/07/31 Today's Date: 07/31/2024   History of Present Illness 83 yo female presents to therapy s/p R THA, anterior approach on 07/30/2024 due to failure of conservative measures. 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    PT Comments  Pt progressing this session. Gait distance limited by fatigue and pain (pt is only able to take tylenol ).  Pt and dtr hopeful to go home  later today, will see again in pm and monitor progress for potential d/c.    If plan is discharge home, recommend the following: A little help with walking and/or transfers;A little help with bathing/dressing/bathroom;Assistance with cooking/housework;Assist for transportation;Help with stairs or ramp for entrance   Can travel by private vehicle        Equipment Recommendations  None recommended by PT    Recommendations for Other Services       Precautions / Restrictions Precautions Precautions: Fall Restrictions Weight Bearing Restrictions Per Provider Order: No     Mobility  Bed Mobility Overal bed mobility: Needs Assistance Bed Mobility: Supine to Sit     Supine to sit: Min assist, HOB elevated     General bed mobility comments: cues and min A for trunk with CGA/tactile cues for LEs to EOB    Transfers Overall transfer level: Needs assistance Equipment used: Rolling walker (2 wheels) Transfers: Sit to/from Stand Sit to Stand: Contact guard assist, Min assist           General transfer comment: cues pt push with L UE from EOB, cues for proper hand placement fot stand to sit and LE position    Ambulation/Gait Ambulation/Gait assistance: Contact guard assist Gait Distance (Feet): 30 Feet Assistive device: Rolling walker  (2 wheels) Gait Pattern/deviations: Step-to pattern, Decreased stance time - right, Antalgic, Trunk flexed Gait velocity: decreased     General Gait Details: cues for RW position, sequence, distance limtied by fatigue and pain   Stairs             Wheelchair Mobility     Tilt Bed    Modified Rankin (Stroke Patients Only)       Balance Overall balance assessment: Needs assistance Sitting-balance support: Feet supported Sitting balance-Leahy Scale: Good     Standing balance support: Bilateral upper extremity supported, During functional activity, Reliant on assistive device for balance Standing balance-Leahy Scale: Poor                              Communication Communication Communication: No apparent difficulties  Cognition Arousal: Alert Behavior During Therapy: WFL for tasks assessed/performed   PT - Cognitive impairments: No apparent impairments                         Following commands: Intact      Cueing    Exercises Total Joint Exercises Ankle Circles/Pumps: AROM, Both, 10 reps Heel Slides: AAROM, Right, 10 reps    General Comments        Pertinent Vitals/Pain Pain Assessment Pain Assessment: 0-10 Pain Score: 8  Pain Location: R hip and LE Pain Descriptors / Indicators: Aching, Constant, Discomfort, Grimacing, Operative site guarding Pain Intervention(s): Limited activity within patient's tolerance, Monitored during  session, Premedicated before session, Repositioned, Ice applied    Home Living                          Prior Function            PT Goals (current goals can now be found in the care plan section) Acute Rehab PT Goals Patient Stated Goal: to be able to walk without pain PT Goal Formulation: With patient Time For Goal Achievement: 08/13/24 Potential to Achieve Goals: Good Progress towards PT goals: Progressing toward goals    Frequency    7X/week      PT Plan       Co-evaluation              AM-PAC PT 6 Clicks Mobility   Outcome Measure  Help needed turning from your back to your side while in a flat bed without using bedrails?: A Little Help needed moving from lying on your back to sitting on the side of a flat bed without using bedrails?: A Little Help needed moving to and from a bed to a chair (including a wheelchair)?: A Little Help needed standing up from a chair using your arms (e.g., wheelchair or bedside chair)?: A Little Help needed to walk in hospital room?: A Little Help needed climbing 3-5 steps with a railing? : A Lot 6 Click Score: 17    End of Session Equipment Utilized During Treatment: Gait belt Activity Tolerance: Patient tolerated treatment well Patient left: in chair;with call bell/phone within reach;with chair alarm set;with family/visitor present Nurse Communication: Mobility status PT Visit Diagnosis: Unsteadiness on feet (R26.81);Other abnormalities of gait and mobility (R26.89);Muscle weakness (generalized) (M62.81);Difficulty in walking, not elsewhere classified (R26.2);Pain Pain - Right/Left: Right Pain - part of body: Hip;Leg     Time: 9049-8987 PT Time Calculation (min) (ACUTE ONLY): 22 min  Charges:    $Gait Training: 8-22 mins PT General Charges $$ ACUTE PT VISIT: 1 Visit                     Lodie Waheed, PT  Acute Rehab Dept [facility]) [phone]  07/31/2024    [facility] 07/31/2024, 12:35 PM

## 2024-07-31 NOTE — Discharge Instructions (Signed)
 Per [facility] clinic policy, our goal is ensure optimal postoperative pain control with a multimodal pain management strategy. For all [facility] patients, our goal is to wean post-operative narcotic medications by 6 weeks post-operatively. If this is not possible due to utilization of pain medication prior to surgery, your [facility] doctor will support your acute post-operative pain control for the first 6 weeks postoperatively, with a plan to transition you back to your primary pain team following that. Teresa Martinez will work to ensure a Therapist, occupational.  INSTRUCTIONS AFTER JOINT REPLACEMENT   Remove items at home which could result in a fall. This includes throw rugs or furniture in walking pathways ICE to the affected joint every three hours while awake for 30 minutes at a time, for at least the first 3-5 days, and then as needed for pain and swelling.  Continue to use ice for pain and swelling. You may notice swelling that will progress down to the foot and ankle.  This is normal after surgery.  Elevate your leg when you are not up walking on it.   Continue to use the breathing machine you got in the hospital (incentive spirometer) which will help keep your temperature down.  It is common for your temperature to cycle up and down following surgery, especially at night when you are not up moving around and exerting yourself.  The breathing machine keeps your lungs expanded and your temperature down.   DIET:  As you were doing prior to hospitalization, we recommend a well-balanced diet.  DRESSING / WOUND CARE / SHOWERING  Keep the surgical dressing until follow up.  The dressing is water  proof, so you can shower without any extra covering.  IF THE DRESSING FALLS OFF or the wound gets wet inside, change the dressing with sterile gauze.  Please use good hand washing techniques before changing the dressing.  Do not use any lotions or creams on the incision until instructed by your surgeon.     ACTIVITY  Increase activity slowly as tolerated, but follow the weight bearing instructions below.   No driving for 6 weeks or until further direction given by your physician.  You cannot drive while taking narcotics.  No lifting or carrying greater than 10 lbs. until further directed by your surgeon. Avoid periods of inactivity such as sitting longer than an hour when not asleep. This helps prevent blood clots.  You may return to work once you are authorized by your doctor.     WEIGHT BEARING   Weight bearing as tolerated with assist device (walker, cane, etc) as directed, use it as long as suggested by your surgeon or therapist, typically at least 4-6 weeks.   EXERCISES  Results after joint replacement surgery are often greatly improved when you follow the exercise, range of motion and muscle strengthening exercises prescribed by your doctor. Safety measures are also important to protect the joint from further injury. Any time any of these exercises cause you to have increased pain or swelling, decrease what you are doing until you are comfortable again and then slowly increase them. If you have problems or questions, call your caregiver or physical therapist for advice.   Rehabilitation is important following a joint replacement. After just a few days of immobilization, the muscles of the leg can become weakened and shrink (atrophy).  These exercises are designed to build up the tone and strength of the thigh and leg muscles and to improve motion. Often times heat used for twenty to thirty minutes before  working out will loosen up your tissues and help with improving the range of motion but do not use heat for the first two weeks following surgery (sometimes heat can increase post-operative swelling).   These exercises can be done on a training (exercise) mat, on the floor, on a table or on a bed. Use whatever works the best and is most comfortable for you.    Use music or television  while you are exercising so that the exercises are a pleasant break in your day. This will make your life better with the exercises acting as a break in your routine that you can look forward to.   Perform all exercises about fifteen times, three times per day or as directed.  You should exercise both the operative leg and the other leg as well.  Exercises include:   Quad Sets - Tighten up the muscle on the front of the thigh (Quad) and hold for 5-10 seconds.   Straight Leg Raises - With your knee straight (if you were given a brace, keep it on), lift the leg to 60 degrees, hold for 3 seconds, and slowly lower the leg.  Perform this exercise against resistance later as your leg gets stronger.  Leg Slides: Lying on your back, slowly slide your foot toward your buttocks, bending your knee up off the floor (only go as far as is comfortable). Then slowly slide your foot back down until your leg is flat on the floor again.  Angel Wings: Lying on your back spread your legs to the side as far apart as you can without causing discomfort.  Hamstring Strength:  Lying on your back, push your heel against the floor with your leg straight by tightening up the muscles of your buttocks.  Repeat, but this time bend your knee to a comfortable angle, and push your heel against the floor.  You may put a pillow under the heel to make it more comfortable if necessary.   A rehabilitation program following joint replacement surgery can speed recovery and prevent re-injury in the future due to weakened muscles. Contact your doctor or a physical therapist for more information on knee rehabilitation.    CONSTIPATION  Constipation is defined medically as fewer than three stools per week and severe constipation as less than one stool per week.  Even if you have a regular bowel pattern at home, your normal regimen is likely to be disrupted due to multiple reasons following surgery.  Combination of anesthesia, postoperative  narcotics, change in appetite and fluid intake all can affect your bowels.   YOU MUST use at least one of the following options; they are listed in order of increasing strength to get the job done.  They are all available over the counter, and you may need to use some, POSSIBLY even all of these options:    Drink plenty of fluids (prune juice may be helpful) and high fiber foods Colace 100 mg by mouth twice a day  Senokot for constipation as directed and as needed Dulcolax (bisacodyl), take with full glass of water  Miralax (polyethylene glycol) once or twice a day as needed.  If you have tried all these things and are unable to have a bowel movement in the first 3-4 days after surgery call either your surgeon or your primary doctor.    If you experience loose stools or diarrhea, hold the medications until you stool forms back up.  If your symptoms do not get better within 1 week  or if they get worse, check with your doctor.  If you experience the worst abdominal pain ever or develop nausea or vomiting, please contact the office immediately for further recommendations for treatment.   ITCHING:  If you experience itching with your medications, try taking only a single pain pill, or even half a pain pill at a time.  You can also use Benadryl  over the counter for itching or also to help with sleep.   TED HOSE STOCKINGS:  Use stockings on both legs until for at least 2 weeks or as directed by physician office. They may be removed at night for sleeping.  MEDICATIONS:  See your medication summary on the "After Visit Summary" that nursing will review with you.  You may have some home medications which will be placed on hold until you complete the course of blood thinner medication.  It is important for you to complete the blood thinner medication as prescribed.  PRECAUTIONS:  If you experience chest pain or shortness of breath - call 911 immediately for transfer to the hospital emergency department.    If you develop a fever greater that 101 F, purulent drainage from wound, increased redness or drainage from wound, foul odor from the wound/dressing, or calf pain - CONTACT YOUR SURGEON.                                                   FOLLOW-UP APPOINTMENTS:  If you do not already have a post-op appointment, please call the office for an appointment to be seen by your surgeon.  Guidelines for how soon to be seen are listed in your "After Visit Summary", but are typically between 1-4 weeks after surgery.  OTHER INSTRUCTIONS:   Knee Replacement:  Do not place pillow under knee, focus on keeping the knee straight while resting. CPM instructions: 0-90 degrees, 2 hours in the morning, 2 hours in the afternoon, and 2 hours in the evening. Place foam block, curve side up under heel at all times except when in CPM or when walking.  DO NOT modify, tear, cut, or change the foam block in any way.  POST-OPERATIVE OPIOID TAPER INSTRUCTIONS: It is important to wean off of your opioid medication as soon as possible. If you do not need pain medication after your surgery it is ok to stop day one. Opioids include: Codeine, Hydrocodone(Norco, Vicodin), Oxycodone (Percocet, oxycontin ) and hydromorphone  amongst others.  Long term and even short term use of opiods can cause: Increased pain response Dependence Constipation Depression Respiratory depression And more.  Withdrawal symptoms can include Flu like symptoms Nausea, vomiting And more Techniques to manage these symptoms Hydrate well Eat regular healthy meals Stay active Use relaxation techniques(deep breathing, meditating, yoga) Do Not substitute Alcohol to help with tapering If you have been on opioids for less than two weeks and do not have pain than it is ok to stop all together.  Plan to wean off of opioids This plan should start within one week post op of your joint replacement. Maintain the same interval or time between taking each dose  and first decrease the dose.  Cut the total daily intake of opioids by one tablet each day Next start to increase the time between doses. The last dose that should be eliminated is the evening dose.   MAKE SURE YOU:  Understand these instructions.  Get help right away if you are not doing well or get worse.    Thank you for letting us  be a part of your medical care team.  It is a privilege we respect greatly.  We hope these instructions will help you stay on track for a fast and full recovery!     Dental Antibiotics:  In most cases prophylactic antibiotics for Dental procdeures after total joint surgery are not necessary.  Exceptions are as follows:  1. History of prior total joint infection  2. Severely immunocompromised (Organ Transplant, cancer chemotherapy, Rheumatoid biologic meds such as Humera)  3. Poorly controlled diabetes (A1C &gt; 8.0, blood glucose over 200)  If you have one of these conditions, contact your surgeon for an antibiotic prescription, prior to your dental procedure.

## 2024-07-31 NOTE — Plan of Care (Signed)

## 2024-07-31 NOTE — Progress Notes (Signed)
 Subjective: 1 Day Post-Op Procedure(s) (LRB): ARTHROPLASTY, HIP, TOTAL, ANTERIOR APPROACH (Right) Patient reports pain as moderate.  According to the patient and to nursing notes she did have an episode of being somewhat unresponsive last night but her vital signs were stable.  She feels a little more discomfort today and has been slow to mobilize.  She is in a bedside chair and is awake and alert.  Objective: Vital signs in last 24 hours: Temp:  [96.6 F (35.9 C)-99.5 F (37.5 C)] 99.5 F (37.5 C) (09/13 1053) Pulse Rate:  [54-87] 77 (09/13 1053) Resp:  [16-28] 17 (09/13 1053) BP: (69-150)/(45-91) 123/62 (09/13 1053) SpO2:  [93 %-100 %] 99 % (09/13 1053) Weight:  [60.7 kg] 60.7 kg (09/13 0121)  Intake/Output from previous day: 09/12 0701 - 09/13 0700 In: 1826.3 [P.O.:360; I.V.:1189.8; IV Piggyback:276.6] Out: 2100 [Urine:2000; Blood:100] Intake/Output this shift: Total I/O In: 120 [P.O.:120] Out: 300 [Urine:300]  Recent Labs    07/31/24 0414  HGB 10.9*   Recent Labs    07/31/24 0414  WBC 11.4*  RBC 4.20  HCT 34.2*  PLT 217   Recent Labs    07/31/24 0414  NA 138  K 4.1  CL 102  CO2 22  BUN 11  CREATININE 0.88  GLUCOSE 139*  CALCIUM  8.7*   No results for input(s): LABPT, INR in the last 72 hours.  Sensation intact distally Intact pulses distally Dorsiflexion/Plantar flexion intact Incision: dressing C/D/I   Assessment/Plan: 1 Day Post-Op Procedure(s) (LRB): ARTHROPLASTY, HIP, TOTAL, ANTERIOR APPROACH (Right) Up with therapy Will definitely need to keep her here today and monitor her progress with mobility. Not safe for discharge to home today.     Teresa Martinez 07/31/2024, 11:01 AM

## 2024-07-31 NOTE — Plan of Care (Signed)

## 2024-08-01 DIAGNOSIS — M1611 Unilateral primary osteoarthritis, right hip: Secondary | ICD-10-CM | POA: Diagnosis not present

## 2024-08-01 MED ORDER — TRAMADOL HCL 50 MG PO TABS: 50.0000 mg | ORAL_TABLET | Freq: Four times a day (QID) | ORAL | 0 refills | Status: AC | PRN

## 2024-08-01 MED ORDER — METHOCARBAMOL 500 MG PO TABS: 500.0000 mg | ORAL_TABLET | Freq: Four times a day (QID) | ORAL | 0 refills | Status: AC | PRN

## 2024-08-01 NOTE — Progress Notes (Signed)
 Physical Therapy Treatment Patient Details Name: Teresa Martinez MRN: [id number] DOB: May 01, 1941 Today's Date: 08/01/2024   History of Present Illness 83 yo female presents to therapy s/p R THA, anterior approach on 07/30/2024 due to failure of conservative measures. 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    PT Comments  Pt making good progress this session, tol incr gait distance and able to review stairs x2 with dtr able to return demo assist on second trial. Pt is min assist to CGA for functional tasks. Pt's dtr is very supportive, encouraging and able to cue pt appropriately.   Pt is ready to d/c home from PT standpoint, pt requests we do a second session this pm --will see again later today    If plan is discharge home, recommend the following: A little help with walking and/or transfers;A little help with bathing/dressing/bathroom;Assistance with cooking/housework;Assist for transportation;Help with stairs or ramp for entrance   Can travel by private vehicle        Equipment Recommendations  None recommended by PT    Recommendations for Other Services       Precautions / Restrictions Precautions Precautions: Fall Restrictions Weight Bearing Restrictions Per Provider Order: No RLE Weight Bearing Per Provider Order: Weight bearing as tolerated     Mobility  Bed Mobility Overal bed mobility: Needs Assistance Bed Mobility: Supine to Sit, Sit to Supine     Supine to sit: Min assist     General bed mobility comments: cues and min A for trunk with CGA/tactile cues for LEs to EOB. incr time needed. improved pt effort    Transfers Overall transfer level: Needs assistance Equipment used: Rolling walker (2 wheels) Transfers: Sit to/from Stand Sit to Stand: Contact guard assist            General transfer comment: ongoing education for LE position and hand placement    Ambulation/Gait Ambulation/Gait assistance: Contact guard assist, Supervision Gait Distance (Feet): 70 Feet Assistive device: Rolling walker (2 wheels) Gait Pattern/deviations: Step-to pattern, Decreased stance time - right, Antalgic, Trunk flexed Gait velocity: decreased     General Gait Details: ongoing education  for RW position, sequence. distance to tolerance. wt shift to RLE improved with early step through pattern emerging   Stairs Stairs: Yes Stairs assistance: Contact guard assist Stair Management: One rail Left, Step to pattern, Forwards, With cane Number of Stairs: 5 (x2) General stair comments: cues for sequence, technique. CGA for safety. no LOB. dtr persent for session and able to assist   Wheelchair Mobility     Tilt Bed    Modified Rankin (Stroke Patients Only)       Balance Overall balance assessment: Needs assistance Sitting-balance support: Feet supported Sitting balance-Leahy Scale: Good     Standing balance support: Bilateral upper extremity supported, During functional activity, Reliant on assistive device for balance Standing balance-Leahy Scale: Poor                              Communication Communication Communication: No apparent difficulties  Cognition Arousal: Alert Behavior During Therapy: WFL for tasks assessed/performed   PT - Cognitive impairments: No apparent impairments  Following commands: Intact      Cueing    Exercises Total Joint Exercises Heel Slides: AAROM, Right, 10 reps    General Comments        Pertinent Vitals/Pain Pain Assessment Pain Assessment: 0-10 Pain Score: 7  Pain Location: R hip and LE Pain Descriptors / Indicators: Aching, Constant, Discomfort, Grimacing, Operative site guarding Pain Intervention(s): Limited activity within patient's tolerance, Monitored during  session, Premedicated before session, Repositioned, Ice applied    Home Living                          Prior Function            PT Goals (current goals can now be found in the care plan section) Acute Rehab PT Goals Patient Stated Goal: to be able to walk without pain PT Goal Formulation: With patient Time For Goal Achievement: 08/13/24 Potential to Achieve Goals: Good Progress towards PT goals: Progressing toward goals    Frequency    7X/week      PT Plan      Co-evaluation              AM-PAC PT 6 Clicks Mobility   Outcome Measure  Help needed turning from your back to your side while in a flat bed without using bedrails?: A Little Help needed moving from lying on your back to sitting on the side of a flat bed without using bedrails?: A Little Help needed moving to and from a bed to a chair (including a wheelchair)?: A Little Help needed standing up from a chair using your arms (e.g., wheelchair or bedside chair)?: A Little Help needed to walk in hospital room?: A Little Help needed climbing 3-5 steps with a railing? : A Lot 6 Click Score: 17    End of Session Equipment Utilized During Treatment: Gait belt Activity Tolerance: Patient tolerated treatment well Patient left: with call bell/phone within reach;with family/visitor present;in bed;with bed alarm set Nurse Communication: Mobility status PT Visit Diagnosis: Unsteadiness on feet (R26.81);Other abnormalities of gait and mobility (R26.89);Muscle weakness (generalized) (M62.81);Difficulty in walking, not elsewhere classified (R26.2);Pain Pain - Right/Left: Right Pain - part of body: Hip;Leg     Time: 9054-8990 PT Time Calculation (min) (ACUTE ONLY): 24 min  Charges:    $Gait Training: 23-37 mins PT General Charges $$ ACUTE PT VISIT: 1 Visit                     Karim Aiello, PT  Acute Rehab Dept [facility]) [phone]  08/01/2024    [facility] 08/01/2024, 10:46 AM

## 2024-08-01 NOTE — Plan of Care (Signed)

## 2024-08-01 NOTE — Plan of Care (Signed)

## 2024-08-01 NOTE — Progress Notes (Signed)
 PT TX NOTE   08/01/24 1600  PT Visit Information  Last PT Received On 08/01/24  Assistance Needed Pt continues to make steady progress, reports she feels loopy from tramadol  which she had earlier this morning. Pt is meeting  goals and could d/c from PT standpoint. Pt requests another day in hospital.   History of Present Illness 83 yo female presents to therapy s/p R THA, anterior approach on 07/30/2024 due to failure of conservative measures. 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  Subjective Data  Patient Stated Goal to be able to walk without pain  Precautions  Precautions Fall  Restrictions  Weight Bearing Restrictions Per Provider Order No  RLE Weight Bearing Per Provider Order WBAT  Pain Assessment  Pain Assessment 0-10  Pain Score 6  Pain Location R hip and LE  Pain Descriptors / Indicators Aching;Constant;Discomfort;Grimacing;Operative site guarding  Cognition  Arousal Alert  Behavior During Therapy WFL for tasks assessed/performed  PT - Cognitive impairments No apparent impairments  Following Commands  Following commands Intact  Communication  Communication No apparent difficulties  Bed Mobility  Overal bed mobility Needs Assistance  Bed Mobility Supine to Sit;Sit to Supine  Supine to sit Min assist  Sit to supine Min assist  General bed mobility comments cues and min A for trunk with CGA/tactile cues for LEs to EOB. incr time needed. min assist to bring LEs on to bed, difficulty using leg lifter d/t incr pain  Transfers  Overall transfer level Needs assistance  Equipment used Rolling walker (2 wheels)  Transfers Sit to/from Stand  Sit to Stand Contact guard assist  General transfer comment ongoing education for LE position and hand placement  Ambulation/Gait  Ambulation/Gait  assistance Contact guard assist;Supervision  Gait Distance (Feet) 75 Feet  Assistive device Rolling walker (2 wheels)  Gait Pattern/deviations Step-to pattern;Decreased stance time - right;Antalgic;Trunk flexed  General Gait Details ongoing education  for RW position, sequence. distance to tolerance. wt shift to RLE improved with  step through pattern emerging  Gait velocity decreased  Balance  Overall balance assessment Needs assistance  Sitting-balance support Feet supported  Sitting balance-Leahy Scale Good  Standing balance support Bilateral upper extremity supported;During functional activity;Reliant on assistive device for balance  Standing balance-Leahy Scale Poor  Total Joint Exercises  Ankle Circles/Pumps AROM;Both;10 reps  Heel Slides AAROM;Right;10 reps  Hip ABduction/ADduction AAROM;Right;10 reps  PT - End of Session  Equipment Utilized During Treatment Gait belt  Activity Tolerance Patient tolerated treatment well  Patient left with call bell/phone within reach;with family/visitor present;in bed;with bed alarm set  Nurse Communication Mobility status   PT - Assessment/Plan  PT Visit Diagnosis Unsteadiness on feet (R26.81);Other abnormalities of gait and mobility (R26.89);Muscle weakness (generalized) (M62.81);Difficulty in walking, not elsewhere classified (R26.2);Pain  Pain - Right/Left Right  Pain - part of body Hip;Leg  PT Frequency (ACUTE ONLY) 7X/week  Follow Up Recommendations Follow physician's recommendations for discharge plan and follow up therapies  Patient can return home with the following A little help with walking and/or transfers;A little help with bathing/dressing/bathroom;Assistance with cooking/housework;Assist for transportation;Help with stairs or ramp for entrance  PT equipment None recommended by PT  AM-PAC PT 6 Clicks Mobility Outcome Measure (Version 2)  Help needed turning from your back to your side while in a flat bed without using  bedrails? 3   Help needed moving from lying on your back to sitting on the side of a flat bed without using bedrails? 3  Help needed moving to and from a bed to a chair (including a wheelchair)? 3  Help needed standing up from a chair using your arms (e.g., wheelchair or bedside chair)? 3  Help needed to walk in hospital room? 3  Help needed climbing 3-5 steps with a railing?  3  6 Click Score 18  Consider Recommendation of Discharge To: Home with [facility]  Progressive Mobility  What is the highest level of mobility based on the mobility assessment? Level 4 (Ambulates with assistance) - Balance while stepping forward/back - Complete  PT Goal Progression  Progress towards PT goals Progressing toward goals  Acute Rehab PT Goals  PT Goal Formulation With patient  Time For Goal Achievement 08/13/24  Potential to Achieve Goals Good  PT Time Calculation  PT Start Time (ACUTE ONLY) 1427  PT Stop Time (ACUTE ONLY) 1448  PT Time Calculation (min) (ACUTE ONLY) 21 min  PT General Charges  $$ ACUTE PT VISIT 1 Visit  PT Treatments  $Gait Training 8-22 mins

## 2024-08-01 NOTE — Progress Notes (Signed)
 Subjective: 2 Days Post-Op Procedure(s) (LRB): ARTHROPLASTY, HIP, TOTAL, ANTERIOR APPROACH (Right) Patient reports pain as mild.  She did better yesterday as the day progressed.  Objective: Vital signs in last 24 hours: Temp:  [98.9 F (37.2 C)-99.5 F (37.5 C)] 99.3 F (37.4 C) (09/14 9386) Pulse Rate:  [75-86] 75 (09/14 0613) Resp:  [16-17] 16 (09/14 0613) BP: (122-142)/(59-67) 122/59 (09/14 0613) SpO2:  [97 %-99 %] 97 % (09/14 0613)  Intake/Output from previous day: 09/13 0701 - 09/14 0700 In: 480 [P.O.:480] Out: 500 [Urine:500] Intake/Output this shift: No intake/output data recorded.  Recent Labs    07/31/24 0414  HGB 10.9*   Recent Labs    07/31/24 0414  WBC 11.4*  RBC 4.20  HCT 34.2*  PLT 217   Recent Labs    07/31/24 0414  NA 138  K 4.1  CL 102  CO2 22  BUN 11  CREATININE 0.88  GLUCOSE 139*  CALCIUM  8.7*   No results for input(s): LABPT, INR in the last 72 hours.  Sensation intact distally Intact pulses distally Dorsiflexion/Plantar flexion intact Incision: dressing C/D/I   Assessment/Plan: 2 Days Post-Op Procedure(s) (LRB): ARTHROPLASTY, HIP, TOTAL, ANTERIOR APPROACH (Right) Up with therapy Discharge home with home health this afternoon if clears therapy.      Lonni [name] 08/01/2024, 8:43 AM

## 2024-08-02 ENCOUNTER — Encounter (HOSPITAL_COMMUNITY): Payer: Self-pay | Admitting: Orthopaedic Surgery

## 2024-08-02 DIAGNOSIS — M1611 Unilateral primary osteoarthritis, right hip: Secondary | ICD-10-CM | POA: Diagnosis not present

## 2024-08-02 NOTE — Discharge Summary (Signed)
 Patient ID: Teresa Martinez MRN: [id number] DOB/AGE: 85/09/1941 85 y.o.  Admit date: 07/30/2024 Discharge date: 08/02/2024  Admission Diagnoses:  Principal Problem:   Unilateral primary osteoarthritis, right hip Active Problems:   Status post total replacement of right hip   Discharge Diagnoses:  Same  Past Medical History:  Diagnosis Date   Arthritis    Cervical spine degeneration    C5/C6   Chronic right shoulder pain    GERD (gastroesophageal reflux disease)    Hemiparesis affecting dominant side as late effect of cerebrovascular accident (HCC) 12/07/2014   Hyperlipemia    Hypertension    Menopausal syndrome    [name]   Overactive bladder    Statin intolerance    Stroke (HCC) 2015   weakness on right side   Varicose vein of leg    left leg    Surgeries: Procedure(s): ARTHROPLASTY, HIP, TOTAL, ANTERIOR APPROACH on 07/30/2024   Consultants:   Discharged Condition: Improved  Hospital Course: Teresa Martinez is an 83 y.o. female who was admitted 07/30/2024 for operative treatment ofUnilateral primary osteoarthritis, right hip. Patient has severe unremitting pain that affects sleep, daily activities, and work/hobbies. After pre-op clearance the patient was taken to the operating room on 07/30/2024 and underwent  Procedure(s): ARTHROPLASTY, HIP, TOTAL, ANTERIOR APPROACH.    Patient was given perioperative antibiotics:  Anti-infectives (From admission, onward)    Start     Dose/Rate Route Frequency Ordered Stop   07/30/24 1800  ceFAZolin  (ANCEF ) IVPB 2g/100 mL premix        2 g 200 mL/hr over 30 Minutes Intravenous Every 6 hours 07/30/24 1543 07/31/24 0103   07/30/24 1015  ceFAZolin  (ANCEF ) IVPB 2g/100 mL premix        2 g 200 mL/hr over 30 Minutes Intravenous On call to O.R. 07/30/24 1007 07/30/24 1212        Patient was given sequential compression devices, early ambulation, and chemoprophylaxis to prevent DVT.  Inpatient Morphine   Milligram Equivalents Per Day 9/12 - 9/15   Values displayed are in units of MME/Day    Order Start / End Date 9/12 9/13 Yesterday Today    oxyCODONE  (Oxy IR/ROXICODONE ) immediate release tablet 5 mg 9/12 - 9/12 0 of Unknown -- -- --    oxyCODONE  (ROXICODONE ) 5 MG/5ML solution 5 mg 9/12 - 9/12 0 of Unknown -- -- --      Group total: 0 of Unknown       fentaNYL  (SUBLIMAZE ) injection 25-50 mcg 9/12 - 9/12 0 of 45-90 -- -- --    meperidine  (DEMEROL ) injection 6.25-12.5 mg 9/12 - 9/12 0 of 7.5-15 -- -- --    HYDROcodone -acetaminophen  (NORCO/VICODIN) 5-325 MG per tablet 1-2 tablet 9/12 - 9/12 0 of 5-10 -- -- --    HYDROcodone -acetaminophen  (NORCO) 7.5-325 MG per tablet 1-2 tablet 9/12 - 9/12 0 of 7.5-15 -- -- --    Daily Totals  0 of Unknown (at least 65-130) -- -- --    Calculation Errors     Order Type Date Details   oxyCODONE  (Oxy IR/ROXICODONE ) immediate release tablet 5 mg Ordered Dose -- Insufficient frequency information   oxyCODONE  (ROXICODONE ) 5 MG/5ML solution 5 mg Ordered Dose -- Insufficient frequency information            Patient benefited maximally from hospital stay and there were no complications.    Recent vital signs: Patient Vitals for the past 24 hrs:  BP Temp Temp src Pulse Resp SpO2  08/02/24 1322 119/70 98  F (36.7 C) -- 86 15 95 %  08/02/24 1000 120/67 98.7 F (37.1 C) Oral 85 16 96 %  08/02/24 0546 124/80 98.8 F (37.1 C) Oral 87 16 96 %  08/01/24 2209 (!) 126/59 99.8 F (37.7 C) Oral 88 17 96 %     Recent laboratory studies:  Recent Labs    07/31/24 0414  WBC 11.4*  HGB 10.9*  HCT 34.2*  PLT 217  NA 138  K 4.1  CL 102  CO2 22  BUN 11  CREATININE 0.88  GLUCOSE 139*  CALCIUM  8.7*     Discharge Medications:   Allergies as of 08/02/2024       Reactions   Oxycodone  Nausea And Vomiting   Statins Other (See Comments)   Muscle pain and unable to walk Muscle pain and unable to walk   Wasp Venom    Latex Other (See Comments)   Burns her  skin        Medication List     TAKE these medications    aspirin  EC 325 MG tablet Take 325 mg by mouth at bedtime.   CALTRATE PLUS PO Take 1 tablet by mouth in the morning.   carboxymethylcellulose 0.5 % Soln Commonly known as: REFRESH PLUS Place 1-2 drops into both eyes 3 (three) times daily as needed (dry/irritated eyes.).   fesoterodine  4 MG Tb24 tablet Commonly known as: TOVIAZ  TAKE 1 TABLET(4 MG) BY MOUTH DAILY   Fish Oil Ultra 1400 MG Caps Take 1,400 mg by mouth in the morning.   Magnesium  250 MG Tabs Take 250 mg by mouth in the morning.   methocarbamol  500 MG tablet Commonly known as: ROBAXIN  Take 1 tablet (500 mg total) by mouth every 6 (six) hours as needed for muscle spasms.   multivitamin with minerals Tabs tablet Take 1 tablet by mouth daily. Centrum   Nexlizet  180-10 MG Tabs Generic drug: Bempedoic Acid-Ezetimibe  Take 1 tablet by mouth daily. What changed: when to take this   pantoprazole  40 MG tablet Commonly known as: PROTONIX  Take 1 tablet (40 mg total) by mouth daily.   Repatha  SureClick 140 MG/ML Soaj Generic drug: Evolocumab  Inject 140 mg into the skin every 14 (fourteen) days.   traMADol  50 MG tablet Commonly known as: ULTRAM  Take 1-2 tablets (50-100 mg total) by mouth every 6 (six) hours as needed for severe pain (pain score 7-10).   valsartan  160 MG tablet Commonly known as: DIOVAN  TAKE 1 TABLET(160 MG) BY MOUTH DAILY What changed: See the new instructions.               Durable Medical Equipment  (From admission, onward)           Start     Ordered   07/30/24 1544  DME 3 n 1  Once        07/30/24 1543   07/30/24 1544  DME Walker rolling  Once       Question Answer Comment  Walker: With 5 Inch Wheels   Patient needs a walker to treat with the following condition Status post total replacement of right hip      07/30/24 1543            Diagnostic Studies: DG Pelvis Portable Result Date: 07/30/2024 CLINICAL  DATA:  Status post right hip arthroplasty EXAM: PORTABLE PELVIS 1-2 VIEWS COMPARISON:  Same day operative radiographs FINDINGS: Sequelae of interval right total hip arthroplasty with components well seated and articulating appropriately. No acute fractures identified. Pelvic  phleboliths. Surgical staples along the right lateral 5. Soft tissue gas in the right thigh. IMPRESSION: Sequelae of interval right total hip arthroplasty with no acute adverse features. Electronically Signed   By: Michaeline Blanch M.D.   On: 07/30/2024 15:19   DG HIP UNILAT WITH PELVIS 1V RIGHT Result Date: 07/30/2024 CLINICAL DATA:  Right hip arthroplasty, intraoperative EXAM: OPERATIVE right HIP (WITH PELVIS IF PERFORMED) 5 VIEWS TECHNIQUE: Fluoroscopic spot image(s) were submitted for interpretation post-operatively. COMPARISON:  April 26, 2024 FINDINGS: Fluoroscopy time: 15 seconds.  Total dose: 1.4 mGy Intraoperative images from right hip total arthroplasty are obtained. Right hip arthroplasty hardware is intact with components well seated and articulating appropriately. Pelvic phleboliths. IMPRESSION: Intraoperative images from right hip total arthroplasty. Please refer to operative report for further details. Electronically Signed   By: Michaeline Blanch M.D.   On: 07/30/2024 14:24   DG C-Arm 1-60 Min-No Report Result Date: 07/30/2024 Fluoroscopy was utilized by the requesting physician.  No radiographic interpretation.    Disposition: Discharge disposition: 01-Home or Self Care          Follow-up Information     Vernetta Lonni GRADE, MD Follow up in 2 week(s).   Specialty: Orthopedic Surgery Contact information: [address] [place]  [place] [address] [phone]                  Signed: Lonni GRADE Vernetta 08/02/2024, 4:20 PM

## 2024-08-02 NOTE — Progress Notes (Signed)
 Patient ID: Teresa Martinez, female   DOB: 31-Dec-1940, 83 y.o.   MRN: [id number] The patient is awake and alert this morning.  Her vital signs are stable.  Her right operative hip is stable.  She did end up staying another day to maximize her therapy and mobility.  The goal is another therapy session today and then hopefully discharge to home later today.

## 2024-08-02 NOTE — Plan of Care (Signed)
  Problem: Activity: Goal: Risk for activity intolerance will decrease Outcome: Progressing   Problem: Safety: Goal: Ability to remain free from injury will improve Outcome: Progressing   Problem: Pain Managment: Goal: General experience of comfort will improve and/or be controlled Outcome: Progressing

## 2024-08-02 NOTE — Progress Notes (Signed)
 Physical Therapy Treatment Patient Details Name: Teresa Martinez MRN: [id number] DOB: 08-Sep-1941 Today's Date: 08/02/2024   History of Present Illness 83 yo female presents to therapy s/p R THA, anterior approach on 07/30/2024 due to failure of conservative measures. 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    PT Comments  Patient  demonstrates progress toward PT goals, practiced steps with daughter present. Patient  has met PT goals for Dc.   If plan is discharge home, recommend the following: A little help with walking and/or transfers;A little help with bathing/dressing/bathroom;Assistance with cooking/housework;Assist for transportation;Help with stairs or ramp for entrance   Can travel by private vehicle        Equipment Recommendations  None recommended by PT    Recommendations for Other Services       Precautions / Restrictions Precautions Precautions: Fall Restrictions RLE Weight Bearing Per Provider Order: Weight bearing as tolerated     Mobility  Bed Mobility Overal bed mobility: Needs Assistance Bed Mobility: Supine to Sit           General bed mobility comments: cues , use of  belt to self assist RLe to bed edge.    Transfers Overall transfer level: Needs assistance Equipment used: Rolling walker (2 wheels) Transfers: Sit to/from Stand Sit to Stand: Contact guard assist           General transfer comment: ongoing education for LE position and hand placement    Ambulation/Gait Ambulation/Gait assistance: Contact guard assist Gait Distance (Feet): 100 Feet   Gait Pattern/deviations: Step-to pattern, Decreased stance time - right, Antalgic, Trunk flexed Gait velocity: decreased     General Gait Details: cues for posture, step length   Stairs Stairs:  Yes Stairs assistance: Contact guard assist Stair Management: One rail Left, Step to pattern, Forwards, With cane Number of Stairs: 3 General stair comments: cues for sequence, technique. CGA for safety. no LOB. dtr persent for session and able to assist. dtr  present   Wheelchair Mobility     Tilt Bed    Modified Rankin (Stroke Patients Only)       Balance   Sitting-balance support: Feet supported Sitting balance-Leahy Scale: Good       Standing balance-Leahy Scale: Fair Standing balance comment: static standing no UE support                            Communication Communication Communication: No apparent difficulties  Cognition Arousal: Alert Behavior During Therapy: WFL for tasks assessed/performed                             Following commands: Intact      Cueing    Exercises Total Joint Exercises Heel Slides: AAROM, Right, 5 reps Hip ABduction/ADduction: AAROM, Right, 10 reps Long Arc Quad: AROM, Right, 5 reps    General Comments        Pertinent Vitals/Pain Pain Assessment Pain Score: 6  Pain Location: R hip and LE Pain Descriptors / Indicators: Aching, Constant, Discomfort, Grimacing, Operative site guarding Pain Intervention(s): Monitored during session, Premedicated before session, Ice applied    Home Living  Prior Function            PT Goals (current goals can now be found in the care plan section) Progress towards PT goals: Progressing toward goals    Frequency    7X/week      PT Plan      Co-evaluation              AM-PAC PT 6 Clicks Mobility   Outcome Measure  Help needed turning from your back to your side while in a flat bed without using bedrails?: A Little Help needed moving from lying on your back to sitting on the side of a flat bed without using bedrails?: A Little Help needed moving to and from a bed to a chair (including a wheelchair)?: A  Little Help needed standing up from a chair using your arms (e.g., wheelchair or bedside chair)?: A Little Help needed to walk in hospital room?: A Little Help needed climbing 3-5 steps with a railing? : A Little 6 Click Score: 18    End of Session Equipment Utilized During Treatment: Gait belt Activity Tolerance: Patient tolerated treatment well Patient left: with call bell/phone within reach;with family/visitor present;in bed;in chair   PT Visit Diagnosis: Unsteadiness on feet (R26.81);Other abnormalities of gait and mobility (R26.89);Muscle weakness (generalized) (M62.81);Difficulty in walking, not elsewhere classified (R26.2);Pain Pain - Right/Left: Right Pain - part of body: Hip;Leg     Time: 9071-9042 PT Time Calculation (min) (ACUTE ONLY): 29 min  Charges:    $Gait Training: 8-22 mins $Therapeutic Exercise: 8-22 mins PT General Charges $$ ACUTE PT VISIT: 1 Visit                     Darice Potters PT Acute Rehabilitation Services Office [phone]    Potters Darice Norris 08/02/2024, 12:14 PM

## 2024-08-03 DIAGNOSIS — M4802 Spinal stenosis, cervical region: Secondary | ICD-10-CM | POA: Diagnosis not present

## 2024-08-03 DIAGNOSIS — I69351 Hemiplegia and hemiparesis following cerebral infarction affecting right dominant side: Secondary | ICD-10-CM | POA: Diagnosis not present

## 2024-08-03 DIAGNOSIS — G4700 Insomnia, unspecified: Secondary | ICD-10-CM | POA: Diagnosis not present

## 2024-08-03 DIAGNOSIS — M48061 Spinal stenosis, lumbar region without neurogenic claudication: Secondary | ICD-10-CM | POA: Diagnosis not present

## 2024-08-03 DIAGNOSIS — I10 Essential (primary) hypertension: Secondary | ICD-10-CM | POA: Diagnosis not present

## 2024-08-03 DIAGNOSIS — M47812 Spondylosis without myelopathy or radiculopathy, cervical region: Secondary | ICD-10-CM | POA: Diagnosis not present

## 2024-08-03 DIAGNOSIS — I69322 Dysarthria following cerebral infarction: Secondary | ICD-10-CM | POA: Diagnosis not present

## 2024-08-03 DIAGNOSIS — Z471 Aftercare following joint replacement surgery: Secondary | ICD-10-CM | POA: Diagnosis not present

## 2024-08-03 DIAGNOSIS — M19019 Primary osteoarthritis, unspecified shoulder: Secondary | ICD-10-CM | POA: Diagnosis not present

## 2024-08-04 DIAGNOSIS — M19019 Primary osteoarthritis, unspecified shoulder: Secondary | ICD-10-CM | POA: Diagnosis not present

## 2024-08-04 DIAGNOSIS — M4802 Spinal stenosis, cervical region: Secondary | ICD-10-CM | POA: Diagnosis not present

## 2024-08-04 DIAGNOSIS — Z471 Aftercare following joint replacement surgery: Secondary | ICD-10-CM | POA: Diagnosis not present

## 2024-08-04 DIAGNOSIS — I69351 Hemiplegia and hemiparesis following cerebral infarction affecting right dominant side: Secondary | ICD-10-CM | POA: Diagnosis not present

## 2024-08-04 DIAGNOSIS — G4700 Insomnia, unspecified: Secondary | ICD-10-CM | POA: Diagnosis not present

## 2024-08-04 DIAGNOSIS — M48061 Spinal stenosis, lumbar region without neurogenic claudication: Secondary | ICD-10-CM | POA: Diagnosis not present

## 2024-08-04 DIAGNOSIS — I69322 Dysarthria following cerebral infarction: Secondary | ICD-10-CM | POA: Diagnosis not present

## 2024-08-04 DIAGNOSIS — I10 Essential (primary) hypertension: Secondary | ICD-10-CM | POA: Diagnosis not present

## 2024-08-04 DIAGNOSIS — M47812 Spondylosis without myelopathy or radiculopathy, cervical region: Secondary | ICD-10-CM | POA: Diagnosis not present

## 2024-08-06 DIAGNOSIS — Z471 Aftercare following joint replacement surgery: Secondary | ICD-10-CM | POA: Diagnosis not present

## 2024-08-06 DIAGNOSIS — M4802 Spinal stenosis, cervical region: Secondary | ICD-10-CM | POA: Diagnosis not present

## 2024-08-06 DIAGNOSIS — M19019 Primary osteoarthritis, unspecified shoulder: Secondary | ICD-10-CM | POA: Diagnosis not present

## 2024-08-06 DIAGNOSIS — I69351 Hemiplegia and hemiparesis following cerebral infarction affecting right dominant side: Secondary | ICD-10-CM | POA: Diagnosis not present

## 2024-08-06 DIAGNOSIS — G4700 Insomnia, unspecified: Secondary | ICD-10-CM | POA: Diagnosis not present

## 2024-08-06 DIAGNOSIS — I10 Essential (primary) hypertension: Secondary | ICD-10-CM | POA: Diagnosis not present

## 2024-08-06 DIAGNOSIS — I69322 Dysarthria following cerebral infarction: Secondary | ICD-10-CM | POA: Diagnosis not present

## 2024-08-06 DIAGNOSIS — M47812 Spondylosis without myelopathy or radiculopathy, cervical region: Secondary | ICD-10-CM | POA: Diagnosis not present

## 2024-08-06 DIAGNOSIS — M48061 Spinal stenosis, lumbar region without neurogenic claudication: Secondary | ICD-10-CM | POA: Diagnosis not present

## 2024-08-09 DIAGNOSIS — I69351 Hemiplegia and hemiparesis following cerebral infarction affecting right dominant side: Secondary | ICD-10-CM | POA: Diagnosis not present

## 2024-08-09 DIAGNOSIS — G4700 Insomnia, unspecified: Secondary | ICD-10-CM | POA: Diagnosis not present

## 2024-08-09 DIAGNOSIS — I69322 Dysarthria following cerebral infarction: Secondary | ICD-10-CM | POA: Diagnosis not present

## 2024-08-09 DIAGNOSIS — M4802 Spinal stenosis, cervical region: Secondary | ICD-10-CM | POA: Diagnosis not present

## 2024-08-09 DIAGNOSIS — M19019 Primary osteoarthritis, unspecified shoulder: Secondary | ICD-10-CM | POA: Diagnosis not present

## 2024-08-09 DIAGNOSIS — M48061 Spinal stenosis, lumbar region without neurogenic claudication: Secondary | ICD-10-CM | POA: Diagnosis not present

## 2024-08-09 DIAGNOSIS — Z471 Aftercare following joint replacement surgery: Secondary | ICD-10-CM | POA: Diagnosis not present

## 2024-08-09 DIAGNOSIS — M47812 Spondylosis without myelopathy or radiculopathy, cervical region: Secondary | ICD-10-CM | POA: Diagnosis not present

## 2024-08-09 DIAGNOSIS — I10 Essential (primary) hypertension: Secondary | ICD-10-CM | POA: Diagnosis not present

## 2024-08-11 DIAGNOSIS — M19019 Primary osteoarthritis, unspecified shoulder: Secondary | ICD-10-CM | POA: Diagnosis not present

## 2024-08-11 DIAGNOSIS — M4802 Spinal stenosis, cervical region: Secondary | ICD-10-CM | POA: Diagnosis not present

## 2024-08-11 DIAGNOSIS — I69322 Dysarthria following cerebral infarction: Secondary | ICD-10-CM | POA: Diagnosis not present

## 2024-08-11 DIAGNOSIS — M47812 Spondylosis without myelopathy or radiculopathy, cervical region: Secondary | ICD-10-CM | POA: Diagnosis not present

## 2024-08-11 DIAGNOSIS — Z471 Aftercare following joint replacement surgery: Secondary | ICD-10-CM | POA: Diagnosis not present

## 2024-08-11 DIAGNOSIS — G4700 Insomnia, unspecified: Secondary | ICD-10-CM | POA: Diagnosis not present

## 2024-08-11 DIAGNOSIS — I10 Essential (primary) hypertension: Secondary | ICD-10-CM | POA: Diagnosis not present

## 2024-08-11 DIAGNOSIS — I69351 Hemiplegia and hemiparesis following cerebral infarction affecting right dominant side: Secondary | ICD-10-CM | POA: Diagnosis not present

## 2024-08-11 DIAGNOSIS — M48061 Spinal stenosis, lumbar region without neurogenic claudication: Secondary | ICD-10-CM | POA: Diagnosis not present

## 2024-08-12 ENCOUNTER — Other Ambulatory Visit: Payer: Self-pay

## 2024-08-12 ENCOUNTER — Ambulatory Visit (INDEPENDENT_AMBULATORY_CARE_PROVIDER_SITE_OTHER): Admitting: Orthopaedic Surgery

## 2024-08-12 ENCOUNTER — Encounter: Payer: Self-pay | Admitting: Orthopaedic Surgery

## 2024-08-12 DIAGNOSIS — Z96641 Presence of right artificial hip joint: Secondary | ICD-10-CM

## 2024-08-12 NOTE — Progress Notes (Signed)
 The patient is an 83 year old female who is here today for first postoperative visit status post a right total hip replacement to treat significant right hip pain and arthritis.  She is ambulating with a walker.  She would like to have some outpatient physical therapy just to work on her balance and coordination as well as strengthening.  She would like it to be set up for her upstairs here.  She is only taking an occasional tramadol  for pain.  She is on an aspirin  daily.  On exam her right hip incision looks good.  Staples were removed and Steri-Strips applied.  Her calf is soft.  We will see her back in a month to see how she is doing overall but no x-rays are needed.  We will work on setting her up for some outpatient physical therapy.

## 2024-08-16 ENCOUNTER — Encounter: Payer: Self-pay | Admitting: Rehabilitative and Restorative Service Providers"

## 2024-08-16 ENCOUNTER — Ambulatory Visit (INDEPENDENT_AMBULATORY_CARE_PROVIDER_SITE_OTHER): Admitting: Rehabilitative and Restorative Service Providers"

## 2024-08-16 DIAGNOSIS — R262 Difficulty in walking, not elsewhere classified: Secondary | ICD-10-CM

## 2024-08-16 DIAGNOSIS — M6281 Muscle weakness (generalized): Secondary | ICD-10-CM

## 2024-08-16 DIAGNOSIS — M25551 Pain in right hip: Secondary | ICD-10-CM | POA: Diagnosis not present

## 2024-08-16 NOTE — Therapy (Signed)
 OUTPATIENT PHYSICAL THERAPY EVALUATION   Patient Name: Teresa Martinez MRN: [id number] DOB:[birth year], 83 y.o., female Today's Date: 08/16/2024  END OF SESSION:  PT End of Session - 08/16/24 1008     Visit Number 1    Number of Visits 20    Date for Recertification  10/25/24    Authorization Type [employer] $20 copay    Authorization - Visit Number 1    PT Start Time 1009    PT Stop Time 1041    PT Time Calculation (min) 32 min    Activity Tolerance Patient tolerated treatment well    Behavior During Therapy WFL for tasks assessed/performed          Past Medical History:  Diagnosis Date   Arthritis    Cervical spine degeneration    C5/C6   Chronic right shoulder pain    GERD (gastroesophageal reflux disease)    Hemiparesis affecting dominant side as late effect of cerebrovascular accident (HCC) 12/07/2014   Hyperlipemia    Hypertension    Menopausal syndrome    [name]   Overactive bladder    Statin intolerance    Stroke (HCC) 2015   weakness on right side   Varicose vein of leg    left leg   Past Surgical History:  Procedure Laterality Date   ABDOMINAL HYSTERECTOMY     1985   BAND HEMORRHOIDECTOMY     2010   CATARACT EXTRACTION, BILATERAL     CHOLECYSTECTOMY     1950   LUMBAR LAMINECTOMY Bilateral 08/03/2018   Dr. Wallene, L3 laminotomy, L4-L5 laminectomy with neural foraminal decompression.   PAROTIDECTOMY     30 years ago   TOTAL HIP ARTHROPLASTY Right 07/30/2024   Procedure: ARTHROPLASTY, HIP, TOTAL, ANTERIOR APPROACH;  Surgeon: Vernetta Lonni GRADE, MD;  Location: [facility];  Service: Orthopedics;  Laterality: Right;   TOTAL KNEE ARTHROPLASTY Left 09/10/2022   Procedure: LEFT TOTAL KNEE ARTHROPLASTY;  Surgeon: Vernetta Lonni GRADE, MD;  Location: [facility];  Service: Orthopedics;  Laterality: Left;   Patient Active Problem List   Diagnosis Date Noted   Status post total replacement of right hip 07/30/2024   Status post left knee  replacement 09/10/2022   IGT (impaired glucose tolerance) 07/27/2021   Preop testing 07/09/2018   Degenerative lumbar spinal stenosis 05/12/2018   Statin myopathy 08/29/2016   Iliotibial band syndrome of right side 04/23/2016   Acromioclavicular arthrosis 06/30/2015   Adhesive capsulitis of right shoulder 02/06/2015   Dysarthria due to cerebrovascular accident 01/09/2015   Hemiparesis affecting dominant side as late effect of cerebrovascular accident (HCC) 12/07/2014   Spastic neurogenic bladder 10/31/2014   Right rotator cuff tendonitis 10/19/2014   Stenosis of cervical spine region    Cerebral infarction due to thrombosis of left middle cerebral artery (HCC)    Left pontine stroke (HCC)    TIA (transient ischemic attack) 10/14/2014   Overactive bladder 10/14/2014   H/O: CVA (cerebrovascular accident) 10/14/2014   Essential hypertension 10/10/2009   INSOMNIA 09/15/2009   Pure hypercholesterolemia 11/28/2008   MENOPAUSAL SYNDROME 11/28/2008    PCP: Theophilus Andrews MD  REFERRING PROVIDER: Vernetta Lonni GRADE, MD  REFERRING DIAG: [phone] (ICD-10-CM) - Status post total replacement of right hip  THERAPY DIAG:  Pain in right hip  Muscle weakness (generalized)  Difficulty in walking, not elsewhere classified  Rationale for Evaluation and Treatment: Rehabilitation  ONSET DATE: 07/30/2024 surgery Rt THA  SUBJECTIVE:   SUBJECTIVE STATEMENT: Pt indicated surgery on 07/30/2024.  Pt indicated  having pain in the beginning more than now.  Reported having a sensitivity to medication in surgery and indicated passing out/not responsive.   Pt indicated waking due to pain in past but not doing it now as much.  Reported having difficulty sleeping on Rt side still.  Walker use in house.    PERTINENT HISTORY: PMH: CVA with Rt hemiparesis, HTN, neurogenic bladder.  Lt TKA 09/10/2022   PAIN:  NPRS scale: at worst 4/10, at current 0/10.  Pain location: anterior, posterior/lateral,  even into thigh.  Pain description: soreness Aggravating factors: lying on Rt side, WB pressure.  Relieving factors: sit/rest. OTC medicine.   PRECAUTIONS: Anterior hip  WEIGHT BEARING RESTRICTIONS: No  FALLS:  Has patient fallen in last 6 months? No  LIVING ENVIRONMENT: Lives with: primary alone but has children staying at this time.  Lives in: House/apartment Stairs:5 to enter at front, 2 in back with handrail on Lt  Has following equipment at home:FWW, SPC  OCCUPATION: Retired  PLOF: Independent, hobbies - read, exercise routine   PATIENT GOALS: walk, drive  OBJECTIVE:   PATIENT SURVEYS:  Patient-Specific Activity Scoring Scheme  0 represents "unable to perform." 10 represents "able to perform at prior level. 0 1 2 3 4 5 6 7 8 9  10 (Date and Score)   Activity Eval  08/16/2024    1. Driving  0    2. Walking  4    3. Cooking 2   4. laundry 2   5. House cleaning 5   Score 2.6avg    Total score = sum of the activity scores/number of activities Minimum detectable change (90%CI) for average score = 2 points Minimum detectable change (90%CI) for single activity score = 3 points  COGNITION: 08/16/2024 Overall cognitive status: WFL    SENSATION: 08/16/2024 No impairment observed in dermatomes.   MUSCLE LENGTH: 08/16/2024 No specific testing.   POSTURE:  08/16/2024 Mild weight shift off Rt leg to Lt in standing.   PALPATION: 08/16/2024 Tenderness to light touch, trigger points/muscle tenderness in Rt glute max, med, min, anterior/lateral quad Rt.   LOWER EXTREMITY ROM:  08/16/2024: Held formalized ROM testing today   ROM Right Eval 08/16/2024 Left Eval 08/16/2024  Hip flexion    Hip extension    Hip abduction    Hip adduction    Hip internal rotation    Hip external rotation    Knee flexion    Knee extension    Ankle dorsiflexion    Ankle plantarflexion    Ankle inversion    Ankle eversion     (Blank rows = not tested)  LOWER EXTREMITY  MMT:  MMT Right Eval 08/16/2024 Left Eval 08/16/2024  Hip flexion 4/5 5/5  Hip extension    Hip abduction 3+/5   Hip adduction    Hip internal rotation    Hip external rotation    Knee flexion 5/5 5/5  Knee extension 4/5 5/5  Ankle dorsiflexion 4+/5 5/5  Ankle plantarflexion    Ankle inversion    Ankle eversion     (Blank rows = not tested)  LOWER EXTREMITY SPECIAL TESTS:  08/16/2024 No   FUNCTIONAL TESTS:  08/16/2024 18 inch chair transfer: unable s UE assist  Lt SLS: not tested  Rt SLS: unable  TUG with FWW;  21.62 seconds  GAIT: 08/16/2024 Household distances in clinic with FWW with step through gait pattern.  TODAY'S TREATMENT                                                                          DATE: 08/16/2024 Therex:    HEP instruction/performance c cues for techniques, handout provided.  Trial set performed of each for comprehension and symptom assessment.  See below for exercise list  Neuro Re-ed (muscle activation, balance control0 Supine quad set 5 sec hold x 10 Standing church pew anterior/posterior weight shifting 1 min with SBA Standing retro step Rt leg posterior weight shift x 10 with SBA  PATIENT EDUCATION:  08/16/2024 Education details: HEP, POC Person educated: Patient Education method: Programmer, multimedia, Demonstration, Verbal cues, and Handouts Education comprehension: verbalized understanding, returned demonstration, and verbal cues required  HOME EXERCISE PROGRAM: Access Code: [email or web address] URL: [email or web address] Date: 08/16/2024 Prepared by: Ozell Silvan  Exercises - Supine Bridge  - 1-2 x daily - 7 x weekly - 1-2 sets - 10 reps - 2 hold - Seated Quad Set  - 2-3 x daily - 7 x weekly - 1 sets - 10 reps - 5 hold - Clamshell (Mirrored)  - 1-2 x daily - 7 x weekly -  2-3 sets - 10-15 reps - Seated Isometric Knee Extension  - 2-3 x daily - 7 x weekly - 1 sets - 10 reps - 5-10 hold - Retro Step  - 1-2 x daily - 7 x weekly - 1 sets - 10-20 reps - Church Pew  - 1-2 x daily - 7 x weekly - 1 sets - 1-2 mins hold  ASSESSMENT:  CLINICAL IMPRESSION: Patient is a 83 y.o. who comes to clinic with complaints of Rt hip pain with mobility, strength and movement coordination deficits that impair their ability to perform usual daily and recreational functional activities without increase difficulty/symptoms at this time.  Patient to benefit from skilled PT services to address impairments and limitations to improve to previous level of function without restriction secondary to condition.   OBJECTIVE IMPAIRMENTS: Abnormal gait, decreased activity tolerance, decreased balance, decreased coordination, decreased endurance, decreased mobility, difficulty walking, decreased ROM, decreased strength, increased fascial restrictions, impaired perceived functional ability, increased muscle spasms, impaired flexibility, improper body mechanics, and pain.   ACTIVITY LIMITATIONS: carrying, lifting, bending, sitting, standing, squatting, sleeping, stairs, transfers, bed mobility, dressing, hygiene/grooming, and locomotion level  PARTICIPATION LIMITATIONS: meal prep, cleaning, laundry, interpersonal relationship, driving, shopping, and community activity  PERSONAL FACTORS: PMH: CVA with Rt hemiparesis, HTN, neurogenic bladder Lt TKA 09/10/2022,  are also affecting patient's functional outcome.   REHAB POTENTIAL: Good  CLINICAL DECISION MAKING: Stable/uncomplicated  EVALUATION COMPLEXITY: Low   GOALS: Goals reviewed with patient? Yes  SHORT TERM GOALS: (target date for Short term goals are 3 weeks 09/06/2024)   1.  Patient will demonstrate independent use of home exercise program to maintain progress from in clinic treatments.  Goal status: New  LONG TERM GOALS: (target dates  for all long term goals are 10 weeks  10/25/2024 )   1. Patient will demonstrate/report pain at worst less than or equal to 2/10 to facilitate minimal limitation in daily activity secondary to pain symptoms.  Goal status: New   2. Patient will demonstrate independent use of home exercise program to facilitate ability  to maintain/progress functional gains from skilled physical therapy services.  Goal status: New   3. Patient will demonstrate Patient specific functional scale avg > or = 8/10 to indicate reduced disability due to condition.   Goal status: New   4.  Patient will demonstrate Rt LE MMT 5/5 throughout to faciltiate usual transfers, stairs, squatting at [facility] for daily life.   Goal status: New   5.  Patient will demonstrate independent ambulation community distances > 500 ft to facilitate community integration.  Goal status: New   6.  Patient will demonstrate TUG < 14 seconds independent to reduce fall risk.  Goal status: New   7.  Patient will demonstrate ascending/descending stairs reciprocally s UE assist for community integration.   Goal Status: New   PLAN:  PT FREQUENCY: 1-2x/week  PT DURATION: 10 weeks  PLANNED INTERVENTIONS: Can include 02853- PT Re-evaluation, 97110-Therapeutic exercises, 97530- Therapeutic activity, 97112- Neuromuscular re-education, 97535- Self Care, 97140- Manual therapy, [phone]- Gait training,[id number]- Electrical stimulation (unattended), 97750 Physical performance testing  Patient/Family education, Balance training, Stair training, Taping, Dry Needling, Joint mobilization, Joint manipulation, Spinal manipulation, Spinal mobilization, Scar mobilization, Vestibular training, Visual/preceptual remediation/compensation, DME instructions, Cryotherapy, and Moist heat.  All performed as medically necessary.  All included unless contraindicated  PLAN FOR NEXT SESSION: Review HEP knowledge/results.  Progressive mobility and strength gains.    Ozell Silvan, PT, DPT, OCS, ATC 08/16/24  10:47 AM   Referring diagnosis? S03.358 (ICD-10-CM) - Status post total replacement of right hip Treatment diagnosis? (if different than referring diagnosis) M25.551, M62.81, R 26.2 What was this (referring dx) caused by? [x]  Surgery []  Fall []  Ongoing issue []  Arthritis []  Other: ____________  Laterality: [x]  Rt []  Lt []  Both  Check all possible CPT codes:  *CHOOSE 10 OR LESS*    See Planned Interventions listed in the Plan section of the Evaluation.

## 2024-08-17 ENCOUNTER — Ambulatory Visit (INDEPENDENT_AMBULATORY_CARE_PROVIDER_SITE_OTHER): Admitting: Physical Therapy

## 2024-08-17 ENCOUNTER — Encounter: Payer: Self-pay | Admitting: Physical Therapy

## 2024-08-17 DIAGNOSIS — R262 Difficulty in walking, not elsewhere classified: Secondary | ICD-10-CM | POA: Diagnosis not present

## 2024-08-17 DIAGNOSIS — M6281 Muscle weakness (generalized): Secondary | ICD-10-CM

## 2024-08-17 DIAGNOSIS — M25551 Pain in right hip: Secondary | ICD-10-CM | POA: Diagnosis not present

## 2024-08-17 DIAGNOSIS — R600 Localized edema: Secondary | ICD-10-CM | POA: Diagnosis not present

## 2024-08-17 DIAGNOSIS — M25562 Pain in left knee: Secondary | ICD-10-CM | POA: Diagnosis not present

## 2024-08-17 DIAGNOSIS — G8929 Other chronic pain: Secondary | ICD-10-CM

## 2024-08-17 NOTE — Therapy (Signed)
 OUTPATIENT PHYSICAL THERAPY TREATMENT  Patient Name: Teresa Martinez MRN: [id number] DOB:06/08/1941, 83 y.o., female Today's Date: 08/17/2024  END OF SESSION:  PT End of Session - 08/17/24 1550     Visit Number 2    Number of Visits 20    Date for Recertification  10/25/24    Authorization Type [employer] $20 copay    PT Start Time 1521    PT Stop Time 1600    PT Time Calculation (min) 39 min    Activity Tolerance Patient tolerated treatment well    Behavior During Therapy WFL for tasks assessed/performed           Past Medical History:  Diagnosis Date   Arthritis    Cervical spine degeneration    C5/C6   Chronic right shoulder pain    GERD (gastroesophageal reflux disease)    Hemiparesis affecting dominant side as late effect of cerebrovascular accident (HCC) 12/07/2014   Hyperlipemia    Hypertension    Menopausal syndrome    [name]   Overactive bladder    Statin intolerance    Stroke (HCC) 2015   weakness on right side   Varicose vein of leg    left leg   Past Surgical History:  Procedure Laterality Date   ABDOMINAL HYSTERECTOMY     1985   BAND HEMORRHOIDECTOMY     2010   CATARACT EXTRACTION, BILATERAL     CHOLECYSTECTOMY     1950   LUMBAR LAMINECTOMY Bilateral 08/03/2018   Dr. Wallene, L3 laminotomy, L4-L5 laminectomy with neural foraminal decompression.   PAROTIDECTOMY     30 years ago   TOTAL HIP ARTHROPLASTY Right 07/30/2024   Procedure: ARTHROPLASTY, HIP, TOTAL, ANTERIOR APPROACH;  Surgeon: Vernetta Lonni GRADE, MD;  Location: [facility];  Service: Orthopedics;  Laterality: Right;   TOTAL KNEE ARTHROPLASTY Left 09/10/2022   Procedure: LEFT TOTAL KNEE ARTHROPLASTY;  Surgeon: Vernetta Lonni GRADE, MD;  Location: [facility];  Service: Orthopedics;  Laterality: Left;   Patient Active Problem List   Diagnosis Date Noted   Status post total replacement of right hip 07/30/2024   Status post left knee replacement 09/10/2022   IGT (impaired  glucose tolerance) 07/27/2021   Preop testing 07/09/2018   Degenerative lumbar spinal stenosis 05/12/2018   Statin myopathy 08/29/2016   Iliotibial band syndrome of right side 04/23/2016   Acromioclavicular arthrosis 06/30/2015   Adhesive capsulitis of right shoulder 02/06/2015   Dysarthria due to cerebrovascular accident 01/09/2015   Hemiparesis affecting dominant side as late effect of cerebrovascular accident (HCC) 12/07/2014   Spastic neurogenic bladder 10/31/2014   Right rotator cuff tendonitis 10/19/2014   Stenosis of cervical spine region    Cerebral infarction due to thrombosis of left middle cerebral artery (HCC)    Left pontine stroke (HCC)    TIA (transient ischemic attack) 10/14/2014   Overactive bladder 10/14/2014   H/O: CVA (cerebrovascular accident) 10/14/2014   Essential hypertension 10/10/2009   INSOMNIA 09/15/2009   Pure hypercholesterolemia 11/28/2008   MENOPAUSAL SYNDROME 11/28/2008    PCP: Theophilus Andrews MD  REFERRING PROVIDER: Vernetta Lonni GRADE, MD  REFERRING DIAG: [phone] (ICD-10-CM) - Status post total replacement of right hip  THERAPY DIAG:  Pain in right hip  Muscle weakness (generalized)  Difficulty in walking, not elsewhere classified  Chronic pain of left knee  Localized edema  Rationale for Evaluation and Treatment: Rehabilitation  ONSET DATE: 07/30/2024 surgery Rt THA  SUBJECTIVE:   SUBJECTIVE STATEMENT: Pt arriving today reporting 1/10 pain in  Rt thigh more lateral. Pt stating it hurts when I move.   PERTINENT HISTORY: PMH: CVA with Rt hemiparesis, HTN, neurogenic bladder.  Lt TKA 09/10/2022   PAIN:  NPRS scale: at worst 1/10 Pain location: anterior, posterior/lateral, even into thigh.  Pain description: soreness Aggravating factors: lying on Rt side, WB pressure.  Relieving factors: sit/rest. OTC medicine.   PRECAUTIONS: Anterior hip  WEIGHT BEARING RESTRICTIONS: No  FALLS:  Has patient fallen in last 6  months? No  LIVING ENVIRONMENT: Lives with: primary alone but has children staying at this time.  Lives in: House/apartment Stairs:5 to enter at front, 2 in back with handrail on Lt  Has following equipment at home:FWW, SPC  OCCUPATION: Retired  PLOF: Independent, hobbies - read, exercise routine   PATIENT GOALS: walk, drive  OBJECTIVE:   PATIENT SURVEYS:  Patient-Specific Activity Scoring Scheme  0 represents "unable to perform." 10 represents "able to perform at prior level. 0 1 2 3 4 5 6 7 8 9  10 (Date and Score)   Activity Eval  08/16/2024    1. Driving  0    2. Walking  4    3. Cooking 2   4. laundry 2   5. House cleaning 5   Score 2.6avg    Total score = sum of the activity scores/number of activities Minimum detectable change (90%CI) for average score = 2 points Minimum detectable change (90%CI) for single activity score = 3 points  COGNITION: 08/16/2024 Overall cognitive status: WFL    SENSATION: 08/16/2024 No impairment observed in dermatomes.   MUSCLE LENGTH: 08/16/2024 No specific testing.   POSTURE:  08/16/2024 Mild weight shift off Rt leg to Lt in standing.   PALPATION: 08/16/2024 Tenderness to light touch, trigger points/muscle tenderness in Rt glute max, med, min, anterior/lateral quad Rt.   LOWER EXTREMITY ROM:  08/16/2024: Held formalized ROM testing today   ROM Right Eval 08/16/2024 Left Eval 08/16/2024  Hip flexion    Hip extension    Hip abduction    Hip adduction    Hip internal rotation    Hip external rotation    Knee flexion    Knee extension    Ankle dorsiflexion    Ankle plantarflexion    Ankle inversion    Ankle eversion     (Blank rows = not tested)  LOWER EXTREMITY MMT:  MMT Right Eval 08/16/2024 Left Eval 08/16/2024  Hip flexion 4/5 5/5  Hip extension    Hip abduction 3+/5   Hip adduction    Hip internal rotation    Hip external rotation    Knee flexion 5/5 5/5  Knee extension 4/5 5/5  Ankle  dorsiflexion 4+/5 5/5  Ankle plantarflexion    Ankle inversion    Ankle eversion     (Blank rows = not tested)  LOWER EXTREMITY SPECIAL TESTS:  08/16/2024 No   FUNCTIONAL TESTS:  08/16/2024 18 inch chair transfer: unable s UE assist  Lt SLS: not tested  Rt SLS: unable  TUG with FWW;  21.62 seconds  GAIT: 08/16/2024 Household distances in clinic with FWW with step through gait pattern.  TODAY'S TREATMENT                                                                          DATE: 08/17/2024 TherEx LAQ: 2 x 10 bil LE x 3 #  Seated SLR: 2 x 10 bil  Bridges:  x 10 c core activation Supine SAQ: 2 x 10 , quad set hold x 5 seconds Supine clam shells x 10 Supine ball squeezes 2 x 10 holding 5 sec Neuro Re-Ed Standing weight shifting: x 10 bil Standing hip hikes x 10  TherAct Sit to stand from mat table at 21 inches 2 x 10 no UE support ( pt instructed in proper foot placement: knees over toes) Nustep: level 4 x 6 minutes UE/LE (push, pull, hip flexion, knee flexion, strengthening)     TODAY'S TREATMENT                                                                          DATE: 08/16/2024 Therex:    HEP instruction/performance c cues for techniques, handout provided.  Trial set performed of each for comprehension and symptom assessment.  See below for exercise list  Neuro Re-ed (muscle activation, balance control0 Supine quad set 5 sec hold x 10 Standing church pew anterior/posterior weight shifting 1 min with SBA Standing retro step Rt leg posterior weight shift x 10 with SBA    PATIENT EDUCATION:  08/16/2024 Education details: HEP, POC Person educated: Patient Education method: Programmer, multimedia, Demonstration, Verbal cues, and Handouts Education comprehension: verbalized understanding, returned  demonstration, and verbal cues required  HOME EXERCISE PROGRAM: Access Code: [email or web address] URL: [email or web address] Date: 08/16/2024 Prepared by: Ozell Silvan  Exercises - Supine Bridge  - 1-2 x daily - 7 x weekly - 1-2 sets - 10 reps - 2 hold - Seated Quad Set  - 2-3 x daily - 7 x weekly - 1 sets - 10 reps - 5 hold - Clamshell (Mirrored)  - 1-2 x daily - 7 x weekly - 2-3 sets - 10-15 reps - Seated Isometric Knee Extension  - 2-3 x daily - 7 x weekly - 1 sets - 10 reps - 5-10 hold - Retro Step  - 1-2 x daily - 7 x weekly - 1 sets - 10-20 reps - [employer] Pew  - 1-2 x daily - 7 x weekly - 1 sets - 1-2 mins hold  ASSESSMENT:  CLINICAL IMPRESSION: Pt tolerating exercises well with no reports of increased pain during session.  Pt's HEP were reviewed and pt able to demonstrate compliance. Recommending continued skilled PT interventions to maximize pt's function.    OBJECTIVE IMPAIRMENTS: Abnormal gait, decreased activity tolerance, decreased balance, decreased coordination, decreased endurance, decreased mobility, difficulty walking, decreased ROM, decreased strength, increased fascial restrictions, impaired perceived functional ability, increased muscle spasms, impaired flexibility, improper body mechanics, and pain.   ACTIVITY LIMITATIONS: carrying, lifting, bending, sitting, standing, squatting, sleeping, stairs, transfers, bed mobility, dressing, hygiene/grooming, and locomotion level  PARTICIPATION  LIMITATIONS: meal prep, cleaning, laundry, interpersonal relationship, driving, shopping, and community activity  PERSONAL FACTORS: PMH: CVA with Rt hemiparesis, HTN, neurogenic bladder Lt TKA 09/10/2022,  are also affecting patient's functional outcome.   REHAB POTENTIAL: Good  CLINICAL DECISION MAKING: Stable/uncomplicated  EVALUATION COMPLEXITY: Low   GOALS: Goals reviewed with patient? Yes  SHORT TERM GOALS: (target date for Short term goals are 3 weeks 09/06/2024)    1.  Patient will demonstrate independent use of home exercise program to maintain progress from in clinic treatments.  Goal status: New  LONG TERM GOALS: (target dates for all long term goals are 10 weeks  10/25/2024 )   1. Patient will demonstrate/report pain at worst less than or equal to 2/10 to facilitate minimal limitation in daily activity secondary to pain symptoms.  Goal status: New   2. Patient will demonstrate independent use of home exercise program to facilitate ability to maintain/progress functional gains from skilled physical therapy services.  Goal status: New   3. Patient will demonstrate Patient specific functional scale avg > or = 8/10 to indicate reduced disability due to condition.   Goal status: New   4.  Patient will demonstrate Rt LE MMT 5/5 throughout to faciltiate usual transfers, stairs, squatting at [facility] for daily life.   Goal status: New   5.  Patient will demonstrate independent ambulation community distances > 500 ft to facilitate community integration.  Goal status: New   6.  Patient will demonstrate TUG < 14 seconds independent to reduce fall risk.  Goal status: New   7.  Patient will demonstrate ascending/descending stairs reciprocally s UE assist for community integration.   Goal Status: New   PLAN:  PT FREQUENCY: 1-2x/week  PT DURATION: 10 weeks  PLANNED INTERVENTIONS: Can include 02853- PT Re-evaluation, 97110-Therapeutic exercises, 97530- Therapeutic activity, 97112- Neuromuscular re-education, 97535- Self Care, 97140- Manual therapy, [phone]- Gait training,G0283- Electrical stimulation (unattended), 97750 Physical performance testing  Patient/Family education, Balance training, Stair training, Taping, Dry Needling, Joint mobilization, Joint manipulation, Spinal manipulation, Spinal mobilization, Scar mobilization, Vestibular training, Visual/preceptual remediation/compensation, DME instructions, Cryotherapy, and Moist heat.  All performed  as medically necessary.  All included unless contraindicated  PLAN FOR NEXT SESSION:  Progressive mobility and strength gains.    Delon Lunger, PT, MPT 08/17/24 3:54 PM   08/17/24  3:54 PM   Referring diagnosis? S03.358 (ICD-10-CM) - Status post total replacement of right hip Treatment diagnosis? (if different than referring diagnosis) M25.551, M62.81, R 26.2 What was this (referring dx) caused by? [x]  Surgery []  Fall []  Ongoing issue []  Arthritis []  Other: ____________  Laterality: [x]  Rt []  Lt []  Both  Check all possible CPT codes:  *CHOOSE 10 OR LESS*    See Planned Interventions listed in the Plan section of the Evaluation.

## 2024-08-23 ENCOUNTER — Encounter: Payer: Self-pay | Admitting: Physical Therapy

## 2024-08-23 ENCOUNTER — Ambulatory Visit: Admitting: Physical Therapy

## 2024-08-23 DIAGNOSIS — M25551 Pain in right hip: Secondary | ICD-10-CM | POA: Diagnosis not present

## 2024-08-23 DIAGNOSIS — R262 Difficulty in walking, not elsewhere classified: Secondary | ICD-10-CM

## 2024-08-23 DIAGNOSIS — M6281 Muscle weakness (generalized): Secondary | ICD-10-CM | POA: Diagnosis not present

## 2024-08-23 NOTE — Therapy (Signed)
 OUTPATIENT PHYSICAL THERAPY TREATMENT  Patient Name: Teresa Martinez MRN: [id number] DOB:[birth year], 83 y.o., female Today's Date: 08/23/2024  END OF SESSION:  PT End of Session - 08/23/24 1143     Visit Number 3    Number of Visits 20    Date for Recertification  10/25/24    Authorization Type [employer] $20 copay    Authorization Time Period 9/29-12/8; 10 visits    Authorization - Visit Number 3    Authorization - Number of Visits 10    PT Start Time 1141    PT Stop Time 1220    PT Time Calculation (min) 39 min    Activity Tolerance Patient tolerated treatment well    Behavior During Therapy WFL for tasks assessed/performed            Past Medical History:  Diagnosis Date   Arthritis    Cervical spine degeneration    C5/C6   Chronic right shoulder pain    GERD (gastroesophageal reflux disease)    Hemiparesis affecting dominant side as late effect of cerebrovascular accident (HCC) 12/07/2014   Hyperlipemia    Hypertension    Menopausal syndrome    [name]   Overactive bladder    Statin intolerance    Stroke (HCC) 2015   weakness on right side   Varicose vein of leg    left leg   Past Surgical History:  Procedure Laterality Date   ABDOMINAL HYSTERECTOMY     1985   BAND HEMORRHOIDECTOMY     2010   CATARACT EXTRACTION, BILATERAL     CHOLECYSTECTOMY     1950   LUMBAR LAMINECTOMY Bilateral 08/03/2018   Dr. Wallene, L3 laminotomy, L4-L5 laminectomy with neural foraminal decompression.   PAROTIDECTOMY     30 years ago   TOTAL HIP ARTHROPLASTY Right 07/30/2024   Procedure: ARTHROPLASTY, HIP, TOTAL, ANTERIOR APPROACH;  Surgeon: Vernetta Lonni GRADE, MD;  Location: [facility];  Service: Orthopedics;  Laterality: Right;   TOTAL KNEE ARTHROPLASTY Left 09/10/2022   Procedure: LEFT TOTAL KNEE ARTHROPLASTY;  Surgeon: Vernetta Lonni GRADE, MD;  Location: [facility];  Service: Orthopedics;  Laterality: Left;   Patient Active Problem List   Diagnosis Date  Noted   Status post total replacement of right hip 07/30/2024   Status post left knee replacement 09/10/2022   IGT (impaired glucose tolerance) 07/27/2021   Preop testing 07/09/2018   Degenerative lumbar spinal stenosis 05/12/2018   Statin myopathy 08/29/2016   Iliotibial band syndrome of right side 04/23/2016   Acromioclavicular arthrosis 06/30/2015   Adhesive capsulitis of right shoulder 02/06/2015   Dysarthria due to cerebrovascular accident 01/09/2015   Hemiparesis affecting dominant side as late effect of cerebrovascular accident (HCC) 12/07/2014   Spastic neurogenic bladder 10/31/2014   Right rotator cuff tendonitis 10/19/2014   Stenosis of cervical spine region    Cerebral infarction due to thrombosis of left middle cerebral artery (HCC)    Left pontine stroke (HCC)    TIA (transient ischemic attack) 10/14/2014   Overactive bladder 10/14/2014   H/O: CVA (cerebrovascular accident) 10/14/2014   Essential hypertension 10/10/2009   INSOMNIA 09/15/2009   Pure hypercholesterolemia 11/28/2008   MENOPAUSAL SYNDROME 11/28/2008    PCP: Theophilus Andrews MD  REFERRING PROVIDER: Vernetta Lonni GRADE, MD  REFERRING DIAG: [phone] (ICD-10-CM) - Status post total replacement of right hip  THERAPY DIAG:  Pain in right hip  Muscle weakness (generalized)  Difficulty in walking, not elsewhere classified  Rationale for Evaluation and Treatment: Rehabilitation  ONSET  DATE: 07/30/2024 surgery Rt THA  SUBJECTIVE:   SUBJECTIVE STATEMENT: C/O soreness in Rt thigh, otherwise pain is minimal  PERTINENT HISTORY: PMH: CVA with Rt hemiparesis, HTN, neurogenic bladder.  Lt TKA 09/10/2022   PAIN:  NPRS scale: soreness 3/10 Pain location: anterior, posterior/lateral, even into thigh.  Pain description: soreness Aggravating factors: lying on Rt side, WB pressure.  Relieving factors: sit/rest. OTC medicine.   PRECAUTIONS: Anterior hip  WEIGHT BEARING RESTRICTIONS: No  FALLS:  Has  patient fallen in last 6 months? No  LIVING ENVIRONMENT: Lives with: primary alone but has children staying at this time.  Lives in: House/apartment Stairs:5 to enter at front, 2 in back with handrail on Lt  Has following equipment at home:FWW, SPC  OCCUPATION: Retired  PLOF: Independent, hobbies - read, exercise routine   PATIENT GOALS: walk, drive  OBJECTIVE:   PATIENT SURVEYS:  Patient-Specific Activity Scoring Scheme  0 represents "unable to perform." 10 represents "able to perform at prior level. 0 1 2 3 4 5 6 7 8 9  10 (Date and Score)   Activity Eval  08/16/2024    1. Driving  0    2. Walking  4    3. Cooking 2   4. laundry 2   5. House cleaning 5   Score 2.6avg    Total score = sum of the activity scores/number of activities Minimum detectable change (90%CI) for average score = 2 points Minimum detectable change (90%CI) for single activity score = 3 points  COGNITION: 08/16/2024 Overall cognitive status: WFL    SENSATION: 08/16/2024 No impairment observed in dermatomes.   MUSCLE LENGTH: 08/16/2024 No specific testing.   POSTURE:  08/16/2024 Mild weight shift off Rt leg to Lt in standing.   PALPATION: 08/16/2024 Tenderness to light touch, trigger points/muscle tenderness in Rt glute max, med, min, anterior/lateral quad Rt.   LOWER EXTREMITY ROM:  08/16/2024: Held formalized ROM testing today   ROM Right Eval 08/16/2024 Left Eval 08/16/2024  Hip flexion    Hip extension    Hip abduction    Hip adduction    Hip internal rotation    Hip external rotation    Knee flexion    Knee extension    Ankle dorsiflexion    Ankle plantarflexion    Ankle inversion    Ankle eversion     (Blank rows = not tested)  LOWER EXTREMITY MMT:  MMT Right Eval 08/16/2024 Left Eval 08/16/2024  Hip flexion 4/5 5/5  Hip extension    Hip abduction 3+/5   Hip adduction    Hip internal rotation    Hip external rotation    Knee flexion 5/5 5/5  Knee extension  4/5 5/5  Ankle dorsiflexion 4+/5 5/5  Ankle plantarflexion    Ankle inversion    Ankle eversion     (Blank rows = not tested)  LOWER EXTREMITY SPECIAL TESTS:  08/16/2024 No   FUNCTIONAL TESTS:  08/16/2024 18 inch chair transfer: unable s UE assist  Lt SLS: not tested  Rt SLS: unable  TUG with FWW;  21.62 seconds  GAIT: 08/16/2024 Household distances in clinic with FWW with step through gait pattern.  TODAY'S TREATMENT 08/23/24 TherAct NuStep L5 x 8 min Leg press bil 75# 3x10; then RLE only 31# 3x10 Forward step ups onto 6 step x 10 bil Lateral step ups onto 6 step x 10 bil Sit to/from stand x10 reps without UE support Bridges 2x10; 5 sec hold Hooklying single limb clamshell 2x10 bil; L3 band  TherEx Seated SLR x10 reps on Rt Supine quad/hip flexor stretch 3x30 sec Supine hip flexion with foot off mat x 5 reps - encouraged to do at home as able  Gait Training Amb within clinic with SPC minguard A to supervision with min cues initially for hand placement and sequencing.    08/17/2024 TherEx LAQ: 2 x 10 bil LE x 3 #  Seated SLR: 2 x 10 bil  Bridges:  x 10 c core activation Supine SAQ: 2 x 10 , quad set hold x 5 seconds Supine clam shells x 10 Supine ball squeezes 2 x 10 holding 5 sec Neuro Re-Ed Standing weight shifting: x 10 bil Standing hip hikes x 10  TherAct Sit to stand from mat table at 21 inches 2 x 10 no UE support ( pt instructed in proper foot placement: knees over toes) Nustep: level 4 x 6 minutes UE/LE (push, pull, hip flexion, knee flexion, strengthening)     08/16/2024 Therex:    HEP instruction/performance c cues for techniques, handout provided.  Trial set performed of each for comprehension and symptom assessment.  See below for exercise list  Neuro Re-ed (muscle activation,  balance control0 Supine quad set 5 sec hold x 10 Standing church pew anterior/posterior weight shifting 1 min with SBA Standing retro step Rt leg posterior weight shift x 10 with SBA    PATIENT EDUCATION:  08/16/2024 Education details: HEP, POC Person educated: Patient Education method: Programmer, multimedia, Demonstration, Verbal cues, and Handouts Education comprehension: verbalized understanding, returned demonstration, and verbal cues required  HOME EXERCISE PROGRAM: Access Code: [email or web address] URL: [email or web address] Date: 08/16/2024 Prepared by: Ozell Silvan  Exercises - Supine Bridge  - 1-2 x daily - 7 x weekly - 1-2 sets - 10 reps - 2 hold - Seated Quad Set  - 2-3 x daily - 7 x weekly - 1 sets - 10 reps - 5 hold - Clamshell (Mirrored)  - 1-2 x daily - 7 x weekly - 2-3 sets - 10-15 reps - Seated Isometric Knee Extension  - 2-3 x daily - 7 x weekly - 1 sets - 10 reps - 5-10 hold - Retro Step  - 1-2 x daily - 7 x weekly - 1 sets - 10-20 reps - Church Pew  - 1-2 x daily - 7 x weekly - 1 sets - 1-2 mins hold  ASSESSMENT:  CLINICAL IMPRESSION: Pt tolerated session well today with continued work on strengthening.  Did begin amb with SPC today with good sequencing and fair balance.  Feel she can practice this at home along counter.  Continue skilled PT.   OBJECTIVE IMPAIRMENTS: Abnormal gait, decreased activity tolerance, decreased balance, decreased coordination, decreased endurance, decreased mobility, difficulty walking, decreased ROM, decreased strength, increased fascial restrictions, impaired perceived functional ability, increased muscle spasms, impaired flexibility, improper body mechanics, and pain.   ACTIVITY LIMITATIONS: carrying, lifting, bending, sitting, standing, squatting, sleeping, stairs, transfers, bed mobility, dressing, hygiene/grooming, and locomotion level  PARTICIPATION LIMITATIONS: meal prep, cleaning, laundry, interpersonal relationship, driving,  shopping, and community activity  PERSONAL FACTORS: PMH: CVA with Rt hemiparesis, HTN, neurogenic bladder Lt TKA 09/10/2022,  are also affecting patient's functional outcome.  REHAB POTENTIAL: Good  CLINICAL DECISION MAKING: Stable/uncomplicated  EVALUATION COMPLEXITY: Low   GOALS: Goals reviewed with patient? Yes  SHORT TERM GOALS: (target date for Short term goals are 3 weeks 09/06/2024)   1.  Patient will demonstrate independent use of home exercise program to maintain progress from in clinic treatments.  Goal status: New  LONG TERM GOALS: (target dates for all long term goals are 10 weeks  10/25/2024 )   1. Patient will demonstrate/report pain at worst less than or equal to 2/10 to facilitate minimal limitation in daily activity secondary to pain symptoms.  Goal status: New   2. Patient will demonstrate independent use of home exercise program to facilitate ability to maintain/progress functional gains from skilled physical therapy services.  Goal status: New   3. Patient will demonstrate Patient specific functional scale avg > or = 8/10 to indicate reduced disability due to condition.   Goal status: New   4.  Patient will demonstrate Rt LE MMT 5/5 throughout to faciltiate usual transfers, stairs, squatting at [facility] for daily life.   Goal status: New   5.  Patient will demonstrate independent ambulation community distances > 500 ft to facilitate community integration.  Goal status: New   6.  Patient will demonstrate TUG < 14 seconds independent to reduce fall risk.  Goal status: New   7.  Patient will demonstrate ascending/descending stairs reciprocally s UE assist for community integration.   Goal Status: New   PLAN:  PT FREQUENCY: 1-2x/week  PT DURATION: 10 weeks  PLANNED INTERVENTIONS: Can include 02853- PT Re-evaluation, 97110-Therapeutic exercises, 97530- Therapeutic activity, 97112- Neuromuscular re-education, 97535- Self Care, [id number]- Manual therapy,  [phone]- Gait training,[id number]- Electrical stimulation (unattended), 97750 Physical performance testing  Patient/Family education, Balance training, Stair training, Taping, Dry Needling, Joint mobilization, Joint manipulation, Spinal manipulation, Spinal mobilization, Scar mobilization, Vestibular training, Visual/preceptual remediation/compensation, DME instructions, Cryotherapy, and Moist heat.  All performed as medically necessary.  All included unless contraindicated  PLAN FOR NEXT SESSION: amb with SPC  Progressive mobility and strength gains.    Corean JULIANNA Ku, PT, DPT 08/23/24 12:44 PM    Referring diagnosis? S03.358 (ICD-10-CM) - Status post total replacement of right hip Treatment diagnosis? (if different than referring diagnosis) M25.551, M62.81, R 26.2 What was this (referring dx) caused by? [x]  Surgery []  Fall []  Ongoing issue []  Arthritis []  Other: ____________  Laterality: [x]  Rt []  Lt []  Both  Check all possible CPT codes:  *CHOOSE 10 OR LESS*    See Planned Interventions listed in the Plan section of the Evaluation.

## 2024-08-24 DIAGNOSIS — K219 Gastro-esophageal reflux disease without esophagitis: Secondary | ICD-10-CM | POA: Diagnosis not present

## 2024-08-24 DIAGNOSIS — E785 Hyperlipidemia, unspecified: Secondary | ICD-10-CM | POA: Diagnosis not present

## 2024-08-24 DIAGNOSIS — Z7982 Long term (current) use of aspirin: Secondary | ICD-10-CM | POA: Diagnosis not present

## 2024-08-24 DIAGNOSIS — K5900 Constipation, unspecified: Secondary | ICD-10-CM | POA: Diagnosis not present

## 2024-08-24 DIAGNOSIS — M1990 Unspecified osteoarthritis, unspecified site: Secondary | ICD-10-CM | POA: Diagnosis not present

## 2024-08-24 DIAGNOSIS — I2510 Atherosclerotic heart disease of native coronary artery without angina pectoris: Secondary | ICD-10-CM | POA: Diagnosis not present

## 2024-08-24 DIAGNOSIS — N182 Chronic kidney disease, stage 2 (mild): Secondary | ICD-10-CM | POA: Diagnosis not present

## 2024-08-24 DIAGNOSIS — I129 Hypertensive chronic kidney disease with stage 1 through stage 4 chronic kidney disease, or unspecified chronic kidney disease: Secondary | ICD-10-CM | POA: Diagnosis not present

## 2024-08-24 DIAGNOSIS — F329 Major depressive disorder, single episode, unspecified: Secondary | ICD-10-CM | POA: Diagnosis not present

## 2024-08-25 ENCOUNTER — Ambulatory Visit: Admitting: Internal Medicine

## 2024-08-25 ENCOUNTER — Encounter: Payer: Self-pay | Admitting: Rehabilitative and Restorative Service Providers"

## 2024-08-25 ENCOUNTER — Ambulatory Visit (INDEPENDENT_AMBULATORY_CARE_PROVIDER_SITE_OTHER): Admitting: Rehabilitative and Restorative Service Providers"

## 2024-08-25 DIAGNOSIS — R262 Difficulty in walking, not elsewhere classified: Secondary | ICD-10-CM

## 2024-08-25 DIAGNOSIS — M25551 Pain in right hip: Secondary | ICD-10-CM | POA: Diagnosis not present

## 2024-08-25 DIAGNOSIS — M6281 Muscle weakness (generalized): Secondary | ICD-10-CM

## 2024-08-25 NOTE — Therapy (Signed)
 OUTPATIENT PHYSICAL THERAPY TREATMENT  Patient Name: Teresa Martinez MRN: [id number] DOB:Jul 11, 1941, 83 y.o., female Today's Date: 08/25/2024  END OF SESSION:  PT End of Session - 08/25/24 1138     Visit Number 4    Number of Visits 20    Date for Recertification  10/25/24    Authorization Type [employer] $20 copay    Authorization Time Period 9/29-12/8; 10 visits    Authorization - Visit Number 4    Authorization - Number of Visits 10    PT Start Time 1133    PT Stop Time 1213    PT Time Calculation (min) 40 min    Activity Tolerance Patient tolerated treatment well    Behavior During Therapy WFL for tasks assessed/performed             Past Medical History:  Diagnosis Date   Arthritis    Cervical spine degeneration    C5/C6   Chronic right shoulder pain    GERD (gastroesophageal reflux disease)    Hemiparesis affecting dominant side as late effect of cerebrovascular accident (HCC) 12/07/2014   Hyperlipemia    Hypertension    Menopausal syndrome    [name]   Overactive bladder    Statin intolerance    Stroke (HCC) 2015   weakness on right side   Varicose vein of leg    left leg   Past Surgical History:  Procedure Laterality Date   ABDOMINAL HYSTERECTOMY     1985   BAND HEMORRHOIDECTOMY     2010   CATARACT EXTRACTION, BILATERAL     CHOLECYSTECTOMY     1950   LUMBAR LAMINECTOMY Bilateral 08/03/2018   Dr. Wallene, L3 laminotomy, L4-L5 laminectomy with neural foraminal decompression.   PAROTIDECTOMY     30 years ago   TOTAL HIP ARTHROPLASTY Right 07/30/2024   Procedure: ARTHROPLASTY, HIP, TOTAL, ANTERIOR APPROACH;  Surgeon: Vernetta Lonni GRADE, MD;  Location: [facility];  Service: Orthopedics;  Laterality: Right;   TOTAL KNEE ARTHROPLASTY Left 09/10/2022   Procedure: LEFT TOTAL KNEE ARTHROPLASTY;  Surgeon: Vernetta Lonni GRADE, MD;  Location: [facility];  Service: Orthopedics;  Laterality: Left;   Patient Active Problem List   Diagnosis  Date Noted   Status post total replacement of right hip 07/30/2024   Status post left knee replacement 09/10/2022   IGT (impaired glucose tolerance) 07/27/2021   Preop testing 07/09/2018   Degenerative lumbar spinal stenosis 05/12/2018   Statin myopathy 08/29/2016   Iliotibial band syndrome of right side 04/23/2016   Acromioclavicular arthrosis 06/30/2015   Adhesive capsulitis of right shoulder 02/06/2015   Dysarthria due to cerebrovascular accident 01/09/2015   Hemiparesis affecting dominant side as late effect of cerebrovascular accident (HCC) 12/07/2014   Spastic neurogenic bladder 10/31/2014   Right rotator cuff tendonitis 10/19/2014   Stenosis of cervical spine region    Cerebral infarction due to thrombosis of left middle cerebral artery (HCC)    Left pontine stroke (HCC)    TIA (transient ischemic attack) 10/14/2014   Overactive bladder 10/14/2014   H/O: CVA (cerebrovascular accident) 10/14/2014   Essential hypertension 10/10/2009   INSOMNIA 09/15/2009   Pure hypercholesterolemia 11/28/2008   MENOPAUSAL SYNDROME 11/28/2008    PCP: Theophilus Andrews MD  REFERRING PROVIDER: Vernetta Lonni GRADE, MD  REFERRING DIAG: [phone] (ICD-10-CM) - Status post total replacement of right hip  THERAPY DIAG:  Pain in right hip  Muscle weakness (generalized)  Difficulty in walking, not elsewhere classified  Rationale for Evaluation and Treatment: Rehabilitation  ONSET DATE: 07/30/2024 surgery Rt THA  SUBJECTIVE:   SUBJECTIVE STATEMENT: Pt indicated no specific pain upon arrival today.  Reported swelling getting a little less every day.  Pt indicated using cane at home.   PERTINENT HISTORY: PMH: CVA with Rt hemiparesis, HTN, neurogenic bladder.  Lt TKA 09/10/2022   PAIN:  NPRS scale: soreness 3/10 Pain location: anterior, posterior/lateral, even into thigh.  Pain description: soreness Aggravating factors: lying on Rt side, WB pressure.  Relieving factors: sit/rest. OTC  medicine.   PRECAUTIONS: Anterior hip  WEIGHT BEARING RESTRICTIONS: No  FALLS:  Has patient fallen in last 6 months? No  LIVING ENVIRONMENT: Lives with: primary alone but has children staying at this time.  Lives in: House/apartment Stairs:5 to enter at front, 2 in back with handrail on Lt  Has following equipment at home:FWW, SPC  OCCUPATION: Retired  PLOF: Independent, hobbies - read, exercise routine   PATIENT GOALS: walk, drive  OBJECTIVE:   PATIENT SURVEYS:  Patient-Specific Activity Scoring Scheme  0 represents "unable to perform." 10 represents "able to perform at prior level. 0 1 2 3 4 5 6 7 8 9  10 (Date and Score)   Activity Eval  08/16/2024    1. Driving  0    2. Walking  4    3. Cooking 2   4. laundry 2   5. House cleaning 5   Score 2.6avg    Total score = sum of the activity scores/number of activities Minimum detectable change (90%CI) for average score = 2 points Minimum detectable change (90%CI) for single activity score = 3 points  COGNITION: 08/16/2024 Overall cognitive status: WFL    SENSATION: 08/16/2024 No impairment observed in dermatomes.   MUSCLE LENGTH: 08/16/2024 No specific testing.   POSTURE:  08/16/2024 Mild weight shift off Rt leg to Lt in standing.   PALPATION: 08/16/2024 Tenderness to light touch, trigger points/muscle tenderness in Rt glute max, med, min, anterior/lateral quad Rt.   LOWER EXTREMITY ROM:  08/16/2024: Held formalized ROM testing today   ROM Right Eval 08/16/2024 Left Eval 08/16/2024  Hip flexion    Hip extension    Hip abduction    Hip adduction    Hip internal rotation    Hip external rotation    Knee flexion    Knee extension    Ankle dorsiflexion    Ankle plantarflexion    Ankle inversion    Ankle eversion     (Blank rows = not tested)  LOWER EXTREMITY MMT:  MMT Right Eval 08/16/2024 Left Eval 08/16/2024  Hip flexion 4/5 5/5  Hip extension    Hip abduction 3+/5   Hip adduction     Hip internal rotation    Hip external rotation    Knee flexion 5/5 5/5  Knee extension 4/5 5/5  Ankle dorsiflexion 4+/5 5/5  Ankle plantarflexion    Ankle inversion    Ankle eversion     (Blank rows = not tested)  LOWER EXTREMITY SPECIAL TESTS:  08/16/2024 No   FUNCTIONAL TESTS:  08/25/2024:  TUG with SPC : 20 seconds.   08/16/2024 18 inch chair transfer: unable s UE assist  Lt SLS: not tested  Rt SLS: unable  TUG with FWW;  21.62 seconds  GAIT: 08/16/2024 Household distances in clinic with FWW with step through gait pattern.  TODAY'S TREATMENT     DATE:  08/25/2024 Therex: Nustep lvl 6 UE/LE for ROM, endurance - 10 mins  Seated quad set with SLR x 10 bilateral with slow control focus , 2-3 inches off floor.   TherActivity: (to improve ambulation, stairs, squatting, transfers) Leg press double leg 81 lbs x 15 slow lowering focus Leg press single leg 37 lbs 2 x 15 bilateral Sit to stand to sit 18 inch chair s UE assist x 10  Step on over and down WB on Rt leg with Lt hand rail assist x 12    Neuro Re-ed (balance improvements, muscle activation) Tandem stance 1 min x 2 bilateral with occasional HHA  Tandem ambulation in // bars fwd/back 10 ft x 4 each way with moderate amount of HHA during movement, SBA.   TODAY'S TREATMENT     DATE: 08/23/24 TherAct NuStep L5 x 8 min Leg press bil 75# 3x10; then RLE only 31# 3x10 Forward step ups onto 6 step x 10 bil Lateral step ups onto 6 step x 10 bil Sit to/from stand x10 reps without UE support Bridges 2x10; 5 sec hold Hooklying single limb clamshell 2x10 bil; L3 band  TherEx Seated SLR x10 reps on Rt Supine quad/hip flexor stretch 3x30 sec Supine hip flexion with foot off mat x 5 reps - encouraged to do at home as able  Gait Training Amb within clinic  with SPC minguard A to supervision with min cues initially for hand placement and sequencing.    TODAY'S TREATMENT     DATE: 08/17/2024 TherEx LAQ: 2 x 10 bil LE x 3 #  Seated SLR: 2 x 10 bil  Bridges:  x 10 c core activation Supine SAQ: 2 x 10 , quad set hold x 5 seconds Supine clam shells x 10 Supine ball squeezes 2 x 10 holding 5 sec Neuro Re-Ed Standing weight shifting: x 10 bil Standing hip hikes x 10  TherAct Sit to stand from mat table at 21 inches 2 x 10 no UE support ( pt instructed in proper foot placement: knees over toes) Nustep: level 4 x 6 minutes UE/LE (push, pull, hip flexion, knee flexion, strengthening)   TODAY'S TREATMENT     DATE: 08/16/2024 Therex:    HEP instruction/performance c cues for techniques, handout provided.  Trial set performed of each for comprehension and symptom assessment.  See below for exercise list  Neuro Re-ed (muscle activation, balance control0 Supine quad set 5 sec hold x 10 Standing church pew anterior/posterior weight shifting 1 min with SBA Standing retro step Rt leg posterior weight shift x 10 with SBA    PATIENT EDUCATION:  08/16/2024 Education details: HEP, POC Person educated: Patient Education method: Programmer, multimedia, Demonstration, Verbal cues, and Handouts Education comprehension: verbalized understanding, returned demonstration, and verbal cues required  HOME EXERCISE PROGRAM: Access Code: [email or web address] URL: [email or web address] Date: 08/16/2024 Prepared by: Ozell Silvan  Exercises - Supine Bridge  - 1-2 x daily - 7 x weekly - 1-2 sets - 10 reps - 2 hold - Seated Quad Set  - 2-3 x daily - 7 x weekly - 1 sets - 10 reps - 5 hold - Clamshell (Mirrored)  - 1-2 x daily - 7 x weekly - 2-3 sets - 10-15 reps - Seated Isometric Knee Extension  - 2-3 x daily - 7 x weekly - 1 sets - 10 reps - 5-10 hold - Retro Step  - 1-2 x daily - 7 x weekly - 1 sets - 10-20 reps -  Church Pew  - 1-2 x daily - 7 x weekly - 1 sets -  1-2 mins hold  ASSESSMENT:  CLINICAL IMPRESSION: Quad weakness on Rt noted compared to Lt.  Will hold SLR for home until improved technique noted in clinic.  Continued skilled PT services indicated at this time.   OBJECTIVE IMPAIRMENTS: Abnormal gait, decreased activity tolerance, decreased balance, decreased coordination, decreased endurance, decreased mobility, difficulty walking, decreased ROM, decreased strength, increased fascial restrictions, impaired perceived functional ability, increased muscle spasms, impaired flexibility, improper body mechanics, and pain.   ACTIVITY LIMITATIONS: carrying, lifting, bending, sitting, standing, squatting, sleeping, stairs, transfers, bed mobility, dressing, hygiene/grooming, and locomotion level  PARTICIPATION LIMITATIONS: meal prep, cleaning, laundry, interpersonal relationship, driving, shopping, and community activity  PERSONAL FACTORS: PMH: CVA with Rt hemiparesis, HTN, neurogenic bladder Lt TKA 09/10/2022,  are also affecting patient's functional outcome.   REHAB POTENTIAL: Good  CLINICAL DECISION MAKING: Stable/uncomplicated  EVALUATION COMPLEXITY: Low   GOALS: Goals reviewed with patient? Yes  SHORT TERM GOALS: (target date for Short term goals are 3 weeks 09/06/2024)   1.  Patient will demonstrate independent use of home exercise program to maintain progress from in clinic treatments.  Goal status: New  LONG TERM GOALS: (target dates for all long term goals are 10 weeks  10/25/2024 )   1. Patient will demonstrate/report pain at worst less than or equal to 2/10 to facilitate minimal limitation in daily activity secondary to pain symptoms.  Goal status: New   2. Patient will demonstrate independent use of home exercise program to facilitate ability to maintain/progress functional gains from skilled physical therapy services.  Goal status: New   3. Patient will demonstrate Patient specific functional scale avg > or = 8/10 to  indicate reduced disability due to condition.   Goal status: New   4.  Patient will demonstrate Rt LE MMT 5/5 throughout to faciltiate usual transfers, stairs, squatting at [facility] for daily life.   Goal status: New   5.  Patient will demonstrate independent ambulation community distances > 500 ft to facilitate community integration.  Goal status: New   6.  Patient will demonstrate TUG < 14 seconds independent to reduce fall risk.  Goal status: New   7.  Patient will demonstrate ascending/descending stairs reciprocally s UE assist for community integration.   Goal Status: New   PLAN:  PT FREQUENCY: 1-2x/week  PT DURATION: 10 weeks  PLANNED INTERVENTIONS: Can include 02853- PT Re-evaluation, 97110-Therapeutic exercises, 97530- Therapeutic activity, 97112- Neuromuscular re-education, 97535- Self Care, [id number]- Manual therapy, [id number]- Gait training,[id number]- Electrical stimulation (unattended), 97750 Physical performance testing  Patient/Family education, Balance training, Stair training, Taping, Dry Needling, Joint mobilization, Joint manipulation, Spinal manipulation, Spinal mobilization, Scar mobilization, Vestibular training, Visual/preceptual remediation/compensation, DME instructions, Cryotherapy, and Moist heat.  All performed as medically necessary.  All included unless contraindicated  PLAN FOR NEXT SESSION: SPC use.  Progressive strengthening.  Maybe SLR for home.    Ozell Silvan, PT, DPT, OCS, ATC 08/25/24  12:17 PM      Referring diagnosis? S03.358 (ICD-10-CM) - Status post total replacement of right hip Treatment diagnosis? (if different than referring diagnosis) M25.551, M62.81, R 26.2 What was this (referring dx) caused by? [x]  Surgery []  Fall []  Ongoing issue []  Arthritis []  Other: ____________  Laterality: [x]  Rt []  Lt []  Both  Check all possible CPT codes:  *CHOOSE 10 OR LESS*    See Planned Interventions listed in the Plan section of the Evaluation.

## 2024-08-30 ENCOUNTER — Encounter: Payer: Self-pay | Admitting: Rehabilitative and Restorative Service Providers"

## 2024-08-30 ENCOUNTER — Ambulatory Visit: Admitting: Rehabilitative and Restorative Service Providers"

## 2024-08-30 DIAGNOSIS — M6281 Muscle weakness (generalized): Secondary | ICD-10-CM | POA: Diagnosis not present

## 2024-08-30 DIAGNOSIS — R262 Difficulty in walking, not elsewhere classified: Secondary | ICD-10-CM

## 2024-08-30 DIAGNOSIS — M25551 Pain in right hip: Secondary | ICD-10-CM | POA: Diagnosis not present

## 2024-08-30 NOTE — Therapy (Signed)
 OUTPATIENT PHYSICAL THERAPY TREATMENT  Patient Name: Teresa Martinez MRN: [id number] DOB:[birth year], 83 y.o., female Today's Date: 08/30/2024  END OF SESSION:  PT End of Session - 08/30/24 1516     Visit Number 5    Number of Visits 20    Date for Recertification  10/25/24    Authorization Type [employer] $20 copay    Authorization Time Period 9/29-12/8; 10 visits    Authorization - Number of Visits 10    PT Start Time 1512    PT Stop Time 1551    PT Time Calculation (min) 39 min    Activity Tolerance Patient tolerated treatment well    Behavior During Therapy WFL for tasks assessed/performed              Past Medical History:  Diagnosis Date   Arthritis    Cervical spine degeneration    C5/C6   Chronic right shoulder pain    GERD (gastroesophageal reflux disease)    Hemiparesis affecting dominant side as late effect of cerebrovascular accident (HCC) 12/07/2014   Hyperlipemia    Hypertension    Menopausal syndrome    [name]   Overactive bladder    Statin intolerance    Stroke (HCC) 2015   weakness on right side   Varicose vein of leg    left leg   Past Surgical History:  Procedure Laterality Date   ABDOMINAL HYSTERECTOMY     1985   BAND HEMORRHOIDECTOMY     2010   CATARACT EXTRACTION, BILATERAL     CHOLECYSTECTOMY     1950   LUMBAR LAMINECTOMY Bilateral 08/03/2018   Dr. Wallene, L3 laminotomy, L4-L5 laminectomy with neural foraminal decompression.   PAROTIDECTOMY     30 years ago   TOTAL HIP ARTHROPLASTY Right 07/30/2024   Procedure: ARTHROPLASTY, HIP, TOTAL, ANTERIOR APPROACH;  Surgeon: Vernetta Lonni GRADE, MD;  Location: [facility];  Service: Orthopedics;  Laterality: Right;   TOTAL KNEE ARTHROPLASTY Left 09/10/2022   Procedure: LEFT TOTAL KNEE ARTHROPLASTY;  Surgeon: Vernetta Lonni GRADE, MD;  Location: [facility];  Service: Orthopedics;  Laterality: Left;   Patient Active Problem List   Diagnosis Date Noted   Status post total  replacement of right hip 07/30/2024   Status post left knee replacement 09/10/2022   IGT (impaired glucose tolerance) 07/27/2021   Preop testing 07/09/2018   Degenerative lumbar spinal stenosis 05/12/2018   Statin myopathy 08/29/2016   Iliotibial band syndrome of right side 04/23/2016   Acromioclavicular arthrosis 06/30/2015   Adhesive capsulitis of right shoulder 02/06/2015   Dysarthria due to cerebrovascular accident 01/09/2015   Hemiparesis affecting dominant side as late effect of cerebrovascular accident (HCC) 12/07/2014   Spastic neurogenic bladder 10/31/2014   Right rotator cuff tendonitis 10/19/2014   Stenosis of cervical spine region    Cerebral infarction due to thrombosis of left middle cerebral artery (HCC)    Left pontine stroke (HCC)    TIA (transient ischemic attack) 10/14/2014   Overactive bladder 10/14/2014   H/O: CVA (cerebrovascular accident) 10/14/2014   Essential hypertension 10/10/2009   INSOMNIA 09/15/2009   Pure hypercholesterolemia 11/28/2008   MENOPAUSAL SYNDROME 11/28/2008    PCP: Theophilus Andrews MD  REFERRING PROVIDER: Vernetta Lonni GRADE, MD  REFERRING DIAG: [phone] (ICD-10-CM) - Status post total replacement of right hip  THERAPY DIAG:  Pain in right hip  Muscle weakness (generalized)  Difficulty in walking, not elsewhere classified  Rationale for Evaluation and Treatment: Rehabilitation  ONSET DATE: 07/30/2024 surgery Rt THA  SUBJECTIVE:   SUBJECTIVE STATEMENT: Pt indicated sorness in thigh and lateral hip, not real bad.  Reported no specific pain.  Walking with SPC now.   PERTINENT HISTORY: PMH: CVA with Rt hemiparesis, HTN, neurogenic bladder.  Lt TKA 09/10/2022   PAIN:  NPRS scale: soreness not pain.  Pain location: anterior, posterior/lateral, even into thigh.  Pain description: soreness Aggravating factors: lying on Rt side, WB pressure.  Relieving factors: sit/rest. OTC medicine.   PRECAUTIONS: Anterior  hip  WEIGHT BEARING RESTRICTIONS: No  FALLS:  Has patient fallen in last 6 months? No  LIVING ENVIRONMENT: Lives with: primary alone but has children staying at this time.  Lives in: House/apartment Stairs:5 to enter at front, 2 in back with handrail on Lt  Has following equipment at home:FWW, SPC  OCCUPATION: Retired  PLOF: Independent, hobbies - read, exercise routine   PATIENT GOALS: walk, drive  OBJECTIVE:   PATIENT SURVEYS:  Patient-Specific Activity Scoring Scheme  0 represents "unable to perform." 10 represents "able to perform at prior level. 0 1 2 3 4 5 6 7 8 9  10 (Date and Score)   Activity Eval  08/16/2024    1. Driving  0    2. Walking  4    3. Cooking 2   4. laundry 2   5. House cleaning 5   Score 2.6avg    Total score = sum of the activity scores/number of activities Minimum detectable change (90%CI) for average score = 2 points Minimum detectable change (90%CI) for single activity score = 3 points  COGNITION: 08/16/2024 Overall cognitive status: WFL    SENSATION: 08/16/2024 No impairment observed in dermatomes.   MUSCLE LENGTH: 08/16/2024 No specific testing.   POSTURE:  08/16/2024 Mild weight shift off Rt leg to Lt in standing.   PALPATION: 08/16/2024 Tenderness to light touch, trigger points/muscle tenderness in Rt glute max, med, min, anterior/lateral quad Rt.   LOWER EXTREMITY ROM:  08/16/2024: Held formalized ROM testing today   ROM Right Eval 08/16/2024 Left Eval 08/16/2024  Hip flexion    Hip extension    Hip abduction    Hip adduction    Hip internal rotation    Hip external rotation    Knee flexion    Knee extension    Ankle dorsiflexion    Ankle plantarflexion    Ankle inversion    Ankle eversion     (Blank rows = not tested)  LOWER EXTREMITY MMT:  MMT Right Eval 08/16/2024 Left Eval 08/16/2024 Right 08/30/2024  Hip flexion 4/5 5/5 5/5  Hip extension     Hip abduction 3+/5    Hip adduction     Hip  internal rotation     Hip external rotation     Knee flexion 5/5 5/5 5/5  Knee extension 4/5 5/5 5/5  Ankle dorsiflexion 4+/5 5/5 5/5  Ankle plantarflexion     Ankle inversion     Ankle eversion      (Blank rows = not tested)  LOWER EXTREMITY SPECIAL TESTS:  08/16/2024 No   FUNCTIONAL TESTS:  08/25/2024:  TUG with SPC : 20 seconds.   08/16/2024 18 inch chair transfer: unable s UE assist  Lt SLS: not tested  Rt SLS: unable  TUG with FWW;  21.62 seconds  GAIT: 08/16/2024 Household distances in clinic with FWW with step through gait pattern.  TODAY'S TREATMENT     DATE:  08/30/2024 Therex: Nustep lvl 6 UE/LE for ROM, endurance - 10 mins  Incline gastroc stretch 30 sec x 3 bilateral    TherActivity: (to improve ambulation, stairs, squatting, transfers) Seated driving replication bosu ball Rt leg press reactive to blazepod green/red light 30 seconds x 6 with various light on times.  Additional time spent in education of return to driving advice with trial in open area for practice.  Lateral stepping 4 inch 2 x 10 bilaterally with light hand assist on bar.    Neuro Re-ed (balance improvements, muscle activation) Retro step x 15 bilaterally in // bars with occasional HHA, SBA Alternate toe tapping 8 inch box x 10 bilateral with CGA to min A    TODAY'S TREATMENT     DATE:  08/25/2024 Therex: Nustep lvl 6 UE/LE for ROM, endurance - 10 mins  Seated quad set with SLR x 10 bilateral with slow control focus , 2-3 inches off floor.   TherActivity: (to improve ambulation, stairs, squatting, transfers) Leg press double leg 81 lbs x 15 slow lowering focus Leg press single leg 37 lbs 2 x 15 bilateral Sit to stand to sit 18 inch chair s UE assist x 10  Step on over and down WB on Rt leg with Lt hand rail assist x 12     Neuro Re-ed (balance improvements, muscle activation) Tandem stance 1 min x 2 bilateral with occasional HHA  Tandem ambulation in // bars fwd/back 10 ft x 4 each way with moderate amount of HHA during movement, SBA.   TODAY'S TREATMENT     DATE: 08/23/24 TherAct NuStep L5 x 8 min Leg press bil 75# 3x10; then RLE only 31# 3x10 Forward step ups onto 6 step x 10 bil Lateral step ups onto 6 step x 10 bil Sit to/from stand x10 reps without UE support Bridges 2x10; 5 sec hold Hooklying single limb clamshell 2x10 bil; L3 band  TherEx Seated SLR x10 reps on Rt Supine quad/hip flexor stretch 3x30 sec Supine hip flexion with foot off mat x 5 reps - encouraged to do at home as able  Gait Training Amb within clinic with SPC minguard A to supervision with min cues initially for hand placement and sequencing.    TODAY'S TREATMENT     DATE: 08/17/2024 TherEx LAQ: 2 x 10 bil LE x 3 #  Seated SLR: 2 x 10 bil  Bridges:  x 10 c core activation Supine SAQ: 2 x 10 , quad set hold x 5 seconds Supine clam shells x 10 Supine ball squeezes 2 x 10 holding 5 sec Neuro Re-Ed Standing weight shifting: x 10 bil Standing hip hikes x 10  TherAct Sit to stand from mat table at 21 inches 2 x 10 no UE support ( pt instructed in proper foot placement: knees over toes) Nustep: level 4 x 6 minutes UE/LE (push, pull, hip flexion, knee flexion, strengthening)   PATIENT EDUCATION:  08/16/2024 Education details: HEP, POC Person educated: Patient Education method: Programmer, multimedia, Demonstration, Verbal cues, and Handouts Education comprehension: verbalized understanding, returned demonstration, and verbal cues required  HOME EXERCISE PROGRAM: Access Code: [email or web address] URL: [email or web address] Date: 08/16/2024 Prepared by: Ozell Silvan  Exercises - Supine Bridge  - 1-2 x daily - 7 x weekly - 1-2 sets - 10 reps - 2 hold - Seated Quad Set  - 2-3 x daily - 7 x weekly - 1 sets - 10 reps - 5  hold - Clamshell (Mirrored)  -  1-2 x daily - 7 x weekly - 2-3 sets - 10-15 reps - Seated Isometric Knee Extension  - 2-3 x daily - 7 x weekly - 1 sets - 10 reps - 5-10 hold - Retro Step  - 1-2 x daily - 7 x weekly - 1 sets - 10-20 reps - [place]  - 1-2 x daily - 7 x weekly - 1 sets - 1-2 mins hold  ASSESSMENT:  CLINICAL IMPRESSION: Discussed working on reaction time for Rt leg movement with pressure for driving return.  Also detailed no prescription pain medicine allowed with driving.  Encouraged open area practice prior to driving.  Continued balance and strengthening important to return to independent ambulation.   OBJECTIVE IMPAIRMENTS: Abnormal gait, decreased activity tolerance, decreased balance, decreased coordination, decreased endurance, decreased mobility, difficulty walking, decreased ROM, decreased strength, increased fascial restrictions, impaired perceived functional ability, increased muscle spasms, impaired flexibility, improper body mechanics, and pain.   ACTIVITY LIMITATIONS: carrying, lifting, bending, sitting, standing, squatting, sleeping, stairs, transfers, bed mobility, dressing, hygiene/grooming, and locomotion level  PARTICIPATION LIMITATIONS: meal prep, cleaning, laundry, interpersonal relationship, driving, shopping, and community activity  PERSONAL FACTORS: PMH: CVA with Rt hemiparesis, HTN, neurogenic bladder Lt TKA 09/10/2022,  are also affecting patient's functional outcome.   REHAB POTENTIAL: Good  CLINICAL DECISION MAKING: Stable/uncomplicated  EVALUATION COMPLEXITY: Low   GOALS: Goals reviewed with patient? Yes  SHORT TERM GOALS: (target date for Short term goals are 3 weeks 09/06/2024)   1.  Patient will demonstrate independent use of home exercise program to maintain progress from in clinic treatments.  Goal status: Met  LONG TERM GOALS: (target dates for all long term goals are 10 weeks  10/25/2024 )   1. Patient will demonstrate/report pain  at worst less than or equal to 2/10 to facilitate minimal limitation in daily activity secondary to pain symptoms.  Goal status: on going 08/30/2024   2. Patient will demonstrate independent use of home exercise program to facilitate ability to maintain/progress functional gains from skilled physical therapy services.  Goal status: on going 08/30/2024   3. Patient will demonstrate Patient specific functional scale avg > or = 8/10 to indicate reduced disability due to condition.   Goal status: on going 08/30/2024   4.  Patient will demonstrate Rt LE MMT 5/5 throughout to faciltiate usual transfers, stairs, squatting at [facility] for daily life.   Goal status: on going 08/30/2024   5.  Patient will demonstrate independent ambulation community distances > 500 ft to facilitate community integration.  Goal status: on going 08/30/2024   6.  Patient will demonstrate TUG < 14 seconds independent to reduce fall risk.  Goal status: on going 08/30/2024   7.  Patient will demonstrate ascending/descending stairs reciprocally s UE assist for community integration.   Goal Status: on going 08/30/2024   PLAN:  PT FREQUENCY: 1-2x/week  PT DURATION: 10 weeks  PLANNED INTERVENTIONS: Can include 02853- PT Re-evaluation, 97110-Therapeutic exercises, 97530- Therapeutic activity, 97112- Neuromuscular re-education, 97535- Self Care, [id number]- Manual therapy, [phone]- Gait training,[id number]- Electrical stimulation (unattended), 97750 Physical performance testing  Patient/Family education, Balance training, Stair training, Taping, Dry Needling, Joint mobilization, Joint manipulation, Spinal manipulation, Spinal mobilization, Scar mobilization, Vestibular training, Visual/preceptual remediation/compensation, DME instructions, Cryotherapy, and Moist heat.  All performed as medically necessary.  All included unless contraindicated  PLAN FOR NEXT SESSION: Driving reaction training for Rt leg movements.    Ozell Silvan, PT, DPT, OCS, ATC 08/30/24  3:51 PM  Referring diagnosis? S03.358 (ICD-10-CM) - Status post total replacement of right hip Treatment diagnosis? (if different than referring diagnosis) M25.551, M62.81, R 26.2 What was this (referring dx) caused by? [x]  Surgery []  Fall []  Ongoing issue []  Arthritis []  Other: ____________  Laterality: [x]  Rt []  Lt []  Both  Check all possible CPT codes:  *CHOOSE 10 OR LESS*    See Planned Interventions listed in the Plan section of the Evaluation.

## 2024-09-01 ENCOUNTER — Encounter: Payer: Self-pay | Admitting: Rehabilitative and Restorative Service Providers"

## 2024-09-01 ENCOUNTER — Ambulatory Visit (INDEPENDENT_AMBULATORY_CARE_PROVIDER_SITE_OTHER): Admitting: Rehabilitative and Restorative Service Providers"

## 2024-09-01 DIAGNOSIS — M6281 Muscle weakness (generalized): Secondary | ICD-10-CM | POA: Diagnosis not present

## 2024-09-01 DIAGNOSIS — M25551 Pain in right hip: Secondary | ICD-10-CM

## 2024-09-01 DIAGNOSIS — R262 Difficulty in walking, not elsewhere classified: Secondary | ICD-10-CM | POA: Diagnosis not present

## 2024-09-01 NOTE — Therapy (Signed)
 OUTPATIENT PHYSICAL THERAPY TREATMENT  Patient Name: Teresa Martinez MRN: [id number] DOB:1941-01-31, 83 y.o., female Today's Date: 09/01/2024  END OF SESSION:  PT End of Session - 09/01/24 1154     Visit Number 6    Number of Visits 20    Date for Recertification  10/25/24    Authorization Type [employer] $20 copay    Authorization Time Period 9/29-12/8; 10 visits    Authorization - Visit Number 6    Authorization - Number of Visits 10    PT Start Time 1138    PT Stop Time 1218    PT Time Calculation (min) 40 min    Activity Tolerance Patient tolerated treatment well    Behavior During Therapy [facility] for tasks assessed/performed               Past Medical History:  Diagnosis Date   Arthritis    Cervical spine degeneration    C5/C6   Chronic right shoulder pain    GERD (gastroesophageal reflux disease)    Hemiparesis affecting dominant side as late effect of cerebrovascular accident (HCC) 12/07/2014   Hyperlipemia    Hypertension    Menopausal syndrome    [name]   Overactive bladder    Statin intolerance    Stroke (HCC) 2015   weakness on right side   Varicose vein of leg    left leg   Past Surgical History:  Procedure Laterality Date   ABDOMINAL HYSTERECTOMY     1985   BAND HEMORRHOIDECTOMY     2010   CATARACT EXTRACTION, BILATERAL     CHOLECYSTECTOMY     1950   LUMBAR LAMINECTOMY Bilateral 08/03/2018   Dr. Wallene, L3 laminotomy, L4-L5 laminectomy with neural foraminal decompression.   PAROTIDECTOMY     30 years ago   TOTAL HIP ARTHROPLASTY Right 07/30/2024   Procedure: ARTHROPLASTY, HIP, TOTAL, ANTERIOR APPROACH;  Surgeon: Vernetta Lonni GRADE, MD;  Location: [facility];  Service: Orthopedics;  Laterality: Right;   TOTAL KNEE ARTHROPLASTY Left 09/10/2022   Procedure: LEFT TOTAL KNEE ARTHROPLASTY;  Surgeon: Vernetta Lonni GRADE, MD;  Location: [facility] OR;  Service: Orthopedics;  Laterality: Left;   Patient Active Problem List    Diagnosis Date Noted   Status post total replacement of right hip 07/30/2024   Status post left knee replacement 09/10/2022   IGT (impaired glucose tolerance) 07/27/2021   Preop testing 07/09/2018   Degenerative lumbar spinal stenosis 05/12/2018   Statin myopathy 08/29/2016   Iliotibial band syndrome of right side 04/23/2016   Acromioclavicular arthrosis 06/30/2015   Adhesive capsulitis of right shoulder 02/06/2015   Dysarthria due to cerebrovascular accident 01/09/2015   Hemiparesis affecting dominant side as late effect of cerebrovascular accident (HCC) 12/07/2014   Spastic neurogenic bladder 10/31/2014   Right rotator cuff tendonitis 10/19/2014   Stenosis of cervical spine region    Cerebral infarction due to thrombosis of left middle cerebral artery (HCC)    Left pontine stroke (HCC)    TIA (transient ischemic attack) 10/14/2014   Overactive bladder 10/14/2014   H/O: CVA (cerebrovascular accident) 10/14/2014   Essential hypertension 10/10/2009   INSOMNIA 09/15/2009   Pure hypercholesterolemia 11/28/2008   MENOPAUSAL SYNDROME 11/28/2008    PCP: Theophilus Andrews MD  REFERRING PROVIDER: Vernetta Lonni GRADE, MD  REFERRING DIAG: [phone] (ICD-10-CM) - Status post total replacement of right hip  THERAPY DIAG:  Pain in right hip  Muscle weakness (generalized)  Difficulty in walking, not elsewhere classified  Rationale for Evaluation and Treatment:  Rehabilitation  ONSET DATE: 07/30/2024 surgery Rt THA  SUBJECTIVE:   SUBJECTIVE STATEMENT: Pt indicated sorness in thigh and lateral hip, not real bad.  Reported no specific pain.  Walking with SPC now.   PERTINENT HISTORY: PMH: CVA with Rt hemiparesis, HTN, neurogenic bladder.  Lt TKA 09/10/2022   PAIN:  NPRS scale: soreness not pain.  Pain location: anterior, posterior/lateral, even into thigh.  Pain description: soreness Aggravating factors: lying on Rt side, WB pressure.  Relieving factors: sit/rest. OTC  medicine.   PRECAUTIONS: Anterior hip  WEIGHT BEARING RESTRICTIONS: No  FALLS:  Has patient fallen in last 6 months? No  LIVING ENVIRONMENT: Lives with: primary alone but has children staying at this time.  Lives in: House/apartment Stairs:5 to enter at front, 2 in back with handrail on Lt  Has following equipment at home:FWW, SPC  OCCUPATION: Retired  PLOF: Independent, hobbies - read, exercise routine   PATIENT GOALS: walk, drive  OBJECTIVE:   PATIENT SURVEYS:  Patient-Specific Activity Scoring Scheme  0 represents "unable to perform." 10 represents "able to perform at prior level. 0 1 2 3 4 5 6 7 8 9  10 (Date and Score)   Activity Eval  08/16/2024    1. Driving  0    2. Walking  4    3. Cooking 2   4. laundry 2   5. House cleaning 5   Score 2.6avg    Total score = sum of the activity scores/number of activities Minimum detectable change (90%CI) for average score = 2 points Minimum detectable change (90%CI) for single activity score = 3 points  COGNITION: 08/16/2024 Overall cognitive status: WFL    SENSATION: 08/16/2024 No impairment observed in dermatomes.   MUSCLE LENGTH: 08/16/2024 No specific testing.   POSTURE:  08/16/2024 Mild weight shift off Rt leg to Lt in standing.   PALPATION: 08/16/2024 Tenderness to light touch, trigger points/muscle tenderness in Rt glute max, med, min, anterior/lateral quad Rt.   LOWER EXTREMITY ROM:  08/16/2024: Held formalized ROM testing today   ROM Right Eval 08/16/2024 Left Eval 08/16/2024  Hip flexion    Hip extension    Hip abduction    Hip adduction    Hip internal rotation    Hip external rotation    Knee flexion    Knee extension    Ankle dorsiflexion    Ankle plantarflexion    Ankle inversion    Ankle eversion     (Blank rows = not tested)  LOWER EXTREMITY MMT:  MMT Right Eval 08/16/2024 Left Eval 08/16/2024 Right 08/30/2024  Hip flexion 4/5 5/5 5/5  Hip extension     Hip abduction  3+/5    Hip adduction     Hip internal rotation     Hip external rotation     Knee flexion 5/5 5/5 5/5  Knee extension 4/5 5/5 5/5  Ankle dorsiflexion 4+/5 5/5 5/5  Ankle plantarflexion     Ankle inversion     Ankle eversion      (Blank rows = not tested)  LOWER EXTREMITY SPECIAL TESTS:  08/16/2024 No   FUNCTIONAL TESTS:  08/25/2024:  TUG with SPC : 20 seconds.   08/16/2024 18 inch chair transfer: unable s UE assist  Lt SLS: not tested  Rt SLS: unable  TUG with FWW;  21.62 seconds  GAIT: 08/16/2024 Household distances in clinic with FWW with step through gait pattern.  TODAY'S TREATMENT     DATE:  09/01/2024 Therex: Nustep lvl 6 UE/LE for ROM, endurance - 10 mins  Lateral stepping green band around mid lower leg in // bars 10 ft x 4 each way  - hands on bar for balance.    TherActivity: (to improve ambulation, stairs, squatting, transfers) Seated driving replication bosu ball Rt leg press reactive to blazepod green/red light 30 seconds x 6 with various light on times.  Leg press double leg 87 lbs x 15 slow lowering focus Leg press single leg 43 lbs 2 x 15 bilateral   Neuro Re-ed (balance improvements, muscle activation) Tandem stance 1 min x 2 bilateral on foam in // bars with occasional to moderate HHA Feet together stance on foam 1 min with SBA and occasional HHA     TODAY'S TREATMENT     DATE:  08/30/2024 Therex: Nustep lvl 6 UE/LE for ROM, endurance - 10 mins  Incline gastroc stretch 30 sec x 3 bilateral    TherActivity: (to improve ambulation, stairs, squatting, transfers) Seated driving replication bosu ball Rt leg press reactive to blazepod green/red light 30 seconds x 6 with various light on times.  Additional time spent in education of return to driving advice with trial in open area for  practice.  Lateral stepping 4 inch 2 x 10 bilaterally with light hand assist on bar.    Neuro Re-ed (balance improvements, muscle activation) Retro step x 15 bilaterally in // bars with occasional HHA, SBA Alternate toe tapping 8 inch box x 10 bilateral with CGA to min A    TODAY'S TREATMENT     DATE:  08/25/2024 Therex: Nustep lvl 6 UE/LE for ROM, endurance - 10 mins  Seated quad set with SLR x 10 bilateral with slow control focus , 2-3 inches off floor.   TherActivity: (to improve ambulation, stairs, squatting, transfers) Leg press double leg 81 lbs x 15 slow lowering focus Leg press single leg 37 lbs 2 x 15 bilateral Sit to stand to sit 18 inch chair s UE assist x 10  Step on over and down WB on Rt leg with Lt hand rail assist x 12    Neuro Re-ed (balance improvements, muscle activation) Tandem stance 1 min x 2 bilateral with occasional HHA  Tandem ambulation in // bars fwd/back 10 ft x 4 each way with moderate amount of HHA during movement, SBA.   TODAY'S TREATMENT     DATE: 08/23/24 TherAct NuStep L5 x 8 min Leg press bil 75# 3x10; then RLE only 31# 3x10 Forward step ups onto 6 step x 10 bil Lateral step ups onto 6 step x 10 bil Sit to/from stand x10 reps without UE support Bridges 2x10; 5 sec hold Hooklying single limb clamshell 2x10 bil; L3 band  TherEx Seated SLR x10 reps on Rt Supine quad/hip flexor stretch 3x30 sec Supine hip flexion with foot off mat x 5 reps - encouraged to do at home as able  Gait Training Amb within clinic with SPC minguard A to supervision with min cues initially for hand placement and sequencing.    TODAY'S TREATMENT     DATE: 08/17/2024 TherEx LAQ: 2 x 10 bil LE x 3 #  Seated SLR: 2 x 10 bil  Bridges:  x 10 c core activation Supine SAQ: 2 x 10 , quad set hold x 5 seconds Supine clam shells x 10 Supine ball squeezes 2 x 10 holding 5 sec Neuro Re-Ed Standing weight shifting: x 10 bil Standing  hip hikes x 10  TherAct Sit to stand  from mat table at 21 inches 2 x 10 no UE support ( pt instructed in proper foot placement: knees over toes) Nustep: level 4 x 6 minutes UE/LE (push, pull, hip flexion, knee flexion, strengthening)   PATIENT EDUCATION:  08/16/2024 Education details: HEP, POC Person educated: Patient Education method: Programmer, multimedia, Demonstration, Verbal cues, and Handouts Education comprehension: verbalized understanding, returned demonstration, and verbal cues required  HOME EXERCISE PROGRAM: Access Code: [email or web address] URL: [email or web address] Date: 08/16/2024 Prepared by: Ozell Silvan  Exercises - Supine Bridge  - 1-2 x daily - 7 x weekly - 1-2 sets - 10 reps - 2 hold - Seated Quad Set  - 2-3 x daily - 7 x weekly - 1 sets - 10 reps - 5 hold - Clamshell (Mirrored)  - 1-2 x daily - 7 x weekly - 2-3 sets - 10-15 reps - Seated Isometric Knee Extension  - 2-3 x daily - 7 x weekly - 1 sets - 10 reps - 5-10 hold - Retro Step  - 1-2 x daily - 7 x weekly - 1 sets - 10-20 reps - [place]  - 1-2 x daily - 7 x weekly - 1 sets - 1-2 mins hold  ASSESSMENT:  CLINICAL IMPRESSION: Discussed working on reaction time for Rt leg movement with pressure for driving return.  Also detailed no prescription pain medicine allowed with driving.  Encouraged open area practice prior to driving.  Continued balance and strengthening important to return to independent ambulation.   OBJECTIVE IMPAIRMENTS: Abnormal gait, decreased activity tolerance, decreased balance, decreased coordination, decreased endurance, decreased mobility, difficulty walking, decreased ROM, decreased strength, increased fascial restrictions, impaired perceived functional ability, increased muscle spasms, impaired flexibility, improper body mechanics, and pain.   ACTIVITY LIMITATIONS: carrying, lifting, bending, sitting, standing, squatting, sleeping, stairs, transfers, bed mobility, dressing, hygiene/grooming, and locomotion  level  PARTICIPATION LIMITATIONS: meal prep, cleaning, laundry, interpersonal relationship, driving, shopping, and community activity  PERSONAL FACTORS: PMH: CVA with Rt hemiparesis, HTN, neurogenic bladder Lt TKA 09/10/2022,  are also affecting patient's functional outcome.   REHAB POTENTIAL: Good  CLINICAL DECISION MAKING: Stable/uncomplicated  EVALUATION COMPLEXITY: Low   GOALS: Goals reviewed with patient? Yes  SHORT TERM GOALS: (target date for Short term goals are 3 weeks 09/06/2024)   1.  Patient will demonstrate independent use of home exercise program to maintain progress from in clinic treatments.  Goal status: Met  LONG TERM GOALS: (target dates for all long term goals are 10 weeks  10/25/2024 )   1. Patient will demonstrate/report pain at worst less than or equal to 2/10 to facilitate minimal limitation in daily activity secondary to pain symptoms.  Goal status: on going 08/30/2024   2. Patient will demonstrate independent use of home exercise program to facilitate ability to maintain/progress functional gains from skilled physical therapy services.  Goal status: on going 08/30/2024   3. Patient will demonstrate Patient specific functional scale avg > or = 8/10 to indicate reduced disability due to condition.   Goal status: on going 08/30/2024   4.  Patient will demonstrate Rt LE MMT 5/5 throughout to faciltiate usual transfers, stairs, squatting at [facility] for daily life.   Goal status: on going 08/30/2024   5.  Patient will demonstrate independent ambulation community distances > 500 ft to facilitate community integration.  Goal status: on going 08/30/2024   6.  Patient will demonstrate TUG < 14 seconds independent to reduce fall risk.  Goal status:  on going 08/30/2024   7.  Patient will demonstrate ascending/descending stairs reciprocally s UE assist for community integration.   Goal Status: on going 08/30/2024   PLAN:  PT FREQUENCY: 1-2x/week  PT  DURATION: 10 weeks  PLANNED INTERVENTIONS: Can include 02853- PT Re-evaluation, 97110-Therapeutic exercises, 97530- Therapeutic activity, 97112- Neuromuscular re-education, 97535- Self Care, [id number]- Manual therapy, [phone]- Gait training,[id number]- Electrical stimulation (unattended), [id number] Physical performance testing  Patient/Family education, Balance training, Stair training, Taping, Dry Needling, Joint mobilization, Joint manipulation, Spinal manipulation, Spinal mobilization, Scar mobilization, Vestibular training, Visual/preceptual remediation/compensation, DME instructions, Cryotherapy, and Moist heat.  All performed as medically necessary.  All included unless contraindicated  PLAN FOR NEXT SESSION: Continued dynamic balance and strengthening as able.    Ozell Silvan, PT, DPT, OCS, ATC 09/01/24  12:18 PM      Referring diagnosis? S03.358 (ICD-10-CM) - Status post total replacement of right hip Treatment diagnosis? (if different than referring diagnosis) M25.551, M62.81, R 26.2 What was this (referring dx) caused by? [x]  Surgery []  Fall []  Ongoing issue []  Arthritis []  Other: ____________  Laterality: [x]  Rt []  Lt []  Both  Check all possible CPT codes:  *CHOOSE 10 OR LESS*    See Planned Interventions listed in the Plan section of the Evaluation.

## 2024-09-06 ENCOUNTER — Encounter: Payer: Self-pay | Admitting: Rehabilitative and Restorative Service Providers"

## 2024-09-06 ENCOUNTER — Ambulatory Visit (INDEPENDENT_AMBULATORY_CARE_PROVIDER_SITE_OTHER): Admitting: Rehabilitative and Restorative Service Providers"

## 2024-09-06 DIAGNOSIS — R262 Difficulty in walking, not elsewhere classified: Secondary | ICD-10-CM

## 2024-09-06 DIAGNOSIS — M25551 Pain in right hip: Secondary | ICD-10-CM | POA: Diagnosis not present

## 2024-09-06 DIAGNOSIS — M6281 Muscle weakness (generalized): Secondary | ICD-10-CM | POA: Diagnosis not present

## 2024-09-06 NOTE — Therapy (Signed)
 OUTPATIENT PHYSICAL THERAPY TREATMENT  Patient Name: Teresa Martinez MRN: [id number] DOB:10/01/1941, 83 y.o., female Today's Date: 09/06/2024  END OF SESSION:  PT End of Session - 09/06/24 1143     Visit Number 7    Number of Visits 20    Date for Recertification  10/25/24    Authorization Type [employer] $20 copay    Authorization Time Period 9/29-12/8; 10 visits    Authorization - Visit Number 7    Authorization - Number of Visits 10    PT Start Time 1138    PT Stop Time 1218    PT Time Calculation (min) 40 min    Equipment Utilized During Treatment Gait belt    Activity Tolerance Patient tolerated treatment well    Behavior During Therapy WFL for tasks assessed/performed                Past Medical History:  Diagnosis Date   Arthritis    Cervical spine degeneration    C5/C6   Chronic right shoulder pain    GERD (gastroesophageal reflux disease)    Hemiparesis affecting dominant side as late effect of cerebrovascular accident (HCC) 12/07/2014   Hyperlipemia    Hypertension    Menopausal syndrome    [name]   Overactive bladder    Statin intolerance    Stroke (HCC) 2015   weakness on right side   Varicose vein of leg    left leg   Past Surgical History:  Procedure Laterality Date   ABDOMINAL HYSTERECTOMY     1985   BAND HEMORRHOIDECTOMY     2010   CATARACT EXTRACTION, BILATERAL     CHOLECYSTECTOMY     1950   LUMBAR LAMINECTOMY Bilateral 08/03/2018   Dr. Wallene, L3 laminotomy, L4-L5 laminectomy with neural foraminal decompression.   PAROTIDECTOMY     30 years ago   TOTAL HIP ARTHROPLASTY Right 07/30/2024   Procedure: ARTHROPLASTY, HIP, TOTAL, ANTERIOR APPROACH;  Surgeon: Vernetta Lonni GRADE, MD;  Location: [facility];  Service: Orthopedics;  Laterality: Right;   TOTAL KNEE ARTHROPLASTY Left 09/10/2022   Procedure: LEFT TOTAL KNEE ARTHROPLASTY;  Surgeon: Vernetta Lonni GRADE, MD;  Location: [facility];  Service: Orthopedics;   Laterality: Left;   Patient Active Problem List   Diagnosis Date Noted   Status post total replacement of right hip 07/30/2024   Status post left knee replacement 09/10/2022   IGT (impaired glucose tolerance) 07/27/2021   Preop testing 07/09/2018   Degenerative lumbar spinal stenosis 05/12/2018   Statin myopathy 08/29/2016   Iliotibial band syndrome of right side 04/23/2016   Acromioclavicular arthrosis 06/30/2015   Adhesive capsulitis of right shoulder 02/06/2015   Dysarthria due to cerebrovascular accident 01/09/2015   Hemiparesis affecting dominant side as late effect of cerebrovascular accident (HCC) 12/07/2014   Spastic neurogenic bladder 10/31/2014   Right rotator cuff tendonitis 10/19/2014   Stenosis of cervical spine region    Cerebral infarction due to thrombosis of left middle cerebral artery (HCC)    Left pontine stroke (HCC)    TIA (transient ischemic attack) 10/14/2014   Overactive bladder 10/14/2014   H/O: CVA (cerebrovascular accident) 10/14/2014   Essential hypertension 10/10/2009   INSOMNIA 09/15/2009   Pure hypercholesterolemia 11/28/2008   MENOPAUSAL SYNDROME 11/28/2008    PCP: Theophilus Andrews MD  REFERRING PROVIDER: Vernetta Lonni GRADE, MD  REFERRING DIAG: [phone] (ICD-10-CM) - Status post total replacement of right hip  THERAPY DIAG:  Pain in right hip  Muscle weakness (generalized)  Difficulty in  walking, not elsewhere classified  Rationale for Evaluation and Treatment: Rehabilitation  ONSET DATE: 07/30/2024 surgery Rt THA  SUBJECTIVE:   SUBJECTIVE STATEMENT: Pt indicated some soreness in thigh and some complaints in hip when sitting.  Reported not painful.   Getting up after sitting can be some stiffness.  Reported driving today.   PERTINENT HISTORY: PMH: CVA with Rt hemiparesis, HTN, neurogenic bladder.  Lt TKA 09/10/2022   PAIN:  NPRS scale: soreness not pain.  Pain location: anterior, posterior/lateral, even into thigh.  Pain  description: soreness Aggravating factors: lying on Rt side, WB pressure.  Relieving factors: sit/rest. OTC medicine.   PRECAUTIONS: Anterior hip  WEIGHT BEARING RESTRICTIONS: No  FALLS:  Has patient fallen in last 6 months? No  LIVING ENVIRONMENT: Lives with: primary alone but has children staying at this time.  Lives in: House/apartment Stairs:5 to enter at front, 2 in back with handrail on Lt  Has following equipment at home:FWW, SPC  OCCUPATION: Retired  PLOF: Independent, hobbies - read, exercise routine   PATIENT GOALS: walk, drive  OBJECTIVE:   PATIENT SURVEYS:  Patient-Specific Activity Scoring Scheme  0 represents "unable to perform." 10 represents "able to perform at prior level. 0 1 2 3 4 5 6 7 8 9  10 (Date and Score)   Activity Eval  08/16/2024  09/06/2024  1. Driving  0 8   2. Walking  4 8   3. Cooking 2 10  4. laundry 2 7  5. House cleaning 5 5  Score 2.6avg 7.6   Total score = sum of the activity scores/number of activities Minimum detectable change (90%CI) for average score = 2 points Minimum detectable change (90%CI) for single activity score = 3 points  COGNITION: 08/16/2024 Overall cognitive status: WFL    SENSATION: 08/16/2024 No impairment observed in dermatomes.   MUSCLE LENGTH: 08/16/2024 No specific testing.   POSTURE:  08/16/2024 Mild weight shift off Rt leg to Lt in standing.   PALPATION: 08/16/2024 Tenderness to light touch, trigger points/muscle tenderness in Rt glute max, med, min, anterior/lateral quad Rt.   LOWER EXTREMITY ROM:  08/16/2024: Held formalized ROM testing today   ROM Right Eval 08/16/2024 Left Eval 08/16/2024  Hip flexion    Hip extension    Hip abduction    Hip adduction    Hip internal rotation    Hip external rotation    Knee flexion    Knee extension    Ankle dorsiflexion    Ankle plantarflexion    Ankle inversion    Ankle eversion     (Blank rows = not tested)  LOWER EXTREMITY  MMT:  MMT Right Eval 08/16/2024 Left Eval 08/16/2024 Right 08/30/2024  Hip flexion 4/5 5/5 5/5  Hip extension     Hip abduction 3+/5    Hip adduction     Hip internal rotation     Hip external rotation     Knee flexion 5/5 5/5 5/5  Knee extension 4/5 5/5 5/5  Ankle dorsiflexion 4+/5 5/5 5/5  Ankle plantarflexion     Ankle inversion     Ankle eversion      (Blank rows = not tested)  LOWER EXTREMITY SPECIAL TESTS:  08/16/2024 No   FUNCTIONAL TESTS:  08/25/2024:  TUG with SPC : 20 seconds.   08/16/2024 18 inch chair transfer: unable s UE assist  Lt SLS: not tested  Rt SLS: unable  TUG with FWW;  21.62 seconds  GAIT: 09/06/2024: Ambulation with SPC to clinic.  Able  to do short distances in clinic independent.   08/16/2024 Household distances in clinic with FWW with step through gait pattern.                                                                                                                                                                         TODAY'S TREATMENT     DATE:  09/06/2024 Therex: Nustep lvl 6 UE/LE for ROM, endurance - 10 mins  Seated Rt leg LAQ 4 lbs 3 x 8   TherActivity: (to improve ambulation, stairs, squatting, transfers) Leg press double leg 100 lbs 2 x 15 slow lowering focus Leg press single leg 56 lbs 2 x 15 bilateral Forward step up WB on Rt leg 4 inch step with SBA to min A at times for LOB.  X 10 Lateral step down 4 inch step WB on Rt leg x 10 with SBA  Neuro Re-ed (balance improvements, muscle activation) Modified tandem stance foot forward with leg elevated with SBA 1 min x 1 bilateral Standing alternating toe tapping 4 inch step x 10 bilateral with SBA  Lateral stepping 3 cones x 6 each way , performed bilaterally with CGA to min A with gait belt.     TODAY'S TREATMENT     DATE:  09/01/2024 Therex: Nustep lvl 6 UE/LE for ROM, endurance - 10 mins  Lateral stepping green band around mid lower leg in // bars 10 ft x 4 each way   - hands on bar for balance.    TherActivity: (to improve ambulation, stairs, squatting, transfers) Seated driving replication bosu ball Rt leg press reactive to blazepod green/red light 30 seconds x 6 with various light on times.  Leg press double leg 87 lbs x 15 slow lowering focus Leg press single leg 43 lbs 2 x 15 bilateral   Neuro Re-ed (balance improvements, muscle activation) Tandem stance 1 min x 2 bilateral on foam in // bars with occasional to moderate HHA Feet together stance on foam 1 min with SBA and occasional HHA     TODAY'S TREATMENT     DATE:  08/30/2024 Therex: Nustep lvl 6 UE/LE for ROM, endurance - 10 mins  Incline gastroc stretch 30 sec x 3 bilateral    TherActivity: (to improve ambulation, stairs, squatting, transfers) Seated driving replication bosu ball Rt leg press reactive to blazepod green/red light 30 seconds x 6 with various light on times.  Additional time spent in education of return to driving advice with trial in open area for practice.  Lateral stepping 4 inch 2 x 10 bilaterally with light hand assist on bar.    Neuro Re-ed (balance improvements, muscle activation) Retro step x 15 bilaterally in // bars with occasional HHA, SBA  Alternate toe tapping 8 inch box x 10 bilateral with CGA to min A    TODAY'S TREATMENT     DATE:  08/25/2024 Therex: Nustep lvl 6 UE/LE for ROM, endurance - 10 mins  Seated quad set with SLR x 10 bilateral with slow control focus , 2-3 inches off floor.   TherActivity: (to improve ambulation, stairs, squatting, transfers) Leg press double leg 81 lbs x 15 slow lowering focus Leg press single leg 37 lbs 2 x 15 bilateral Sit to stand to sit 18 inch chair s UE assist x 10  Step on over and down WB on Rt leg with Lt hand rail assist x 12    Neuro Re-ed (balance improvements, muscle activation) Tandem stance 1 min x 2 bilateral with occasional HHA  Tandem ambulation in // bars fwd/back 10 ft x 4 each way with moderate  amount of HHA during movement, SBA.   TODAY'S TREATMENT     DATE: 08/23/24 TherAct NuStep L5 x 8 min Leg press bil 75# 3x10; then RLE only 31# 3x10 Forward step ups onto 6 step x 10 bil Lateral step ups onto 6 step x 10 bil Sit to/from stand x10 reps without UE support Bridges 2x10; 5 sec hold Hooklying single limb clamshell 2x10 bil; L3 band  TherEx Seated SLR x10 reps on Rt Supine quad/hip flexor stretch 3x30 sec Supine hip flexion with foot off mat x 5 reps - encouraged to do at home as able  Gait Training Amb within clinic with SPC minguard A to supervision with min cues initially for hand placement and sequencing.    PATIENT EDUCATION:  08/16/2024 Education details: HEP, POC Person educated: Patient Education method: Programmer, multimedia, Demonstration, Verbal cues, and Handouts Education comprehension: verbalized understanding, returned demonstration, and verbal cues required  HOME EXERCISE PROGRAM: Access Code: [email or web address] URL: [email or web address] Date: 08/16/2024 Prepared by: Ozell Silvan  Exercises - Supine Bridge  - 1-2 x daily - 7 x weekly - 1-2 sets - 10 reps - 2 hold - Seated Quad Set  - 2-3 x daily - 7 x weekly - 1 sets - 10 reps - 5 hold - Clamshell (Mirrored)  - 1-2 x daily - 7 x weekly - 2-3 sets - 10-15 reps - Seated Isometric Knee Extension  - 2-3 x daily - 7 x weekly - 1 sets - 10 reps - 5-10 hold - Retro Step  - 1-2 x daily - 7 x weekly - 1 sets - 10-20 reps - [place]  - 1-2 x daily - 7 x weekly - 1 sets - 1-2 mins hold  ASSESSMENT:  CLINICAL IMPRESSION: SPC ambulation continued to show improvement in quality.  Continued strengthening to help improve WB pressure control.  Advice on soft tissue sensitization for incision area.  Continued skilled PT services indicated at this time.     OBJECTIVE IMPAIRMENTS: Abnormal gait, decreased activity tolerance, decreased balance, decreased coordination, decreased endurance, decreased mobility,  difficulty walking, decreased ROM, decreased strength, increased fascial restrictions, impaired perceived functional ability, increased muscle spasms, impaired flexibility, improper body mechanics, and pain.   ACTIVITY LIMITATIONS: carrying, lifting, bending, sitting, standing, squatting, sleeping, stairs, transfers, bed mobility, dressing, hygiene/grooming, and locomotion level  PARTICIPATION LIMITATIONS: meal prep, cleaning, laundry, interpersonal relationship, driving, shopping, and community activity  PERSONAL FACTORS: PMH: CVA with Rt hemiparesis, HTN, neurogenic bladder Lt TKA 09/10/2022,  are also affecting patient's functional outcome.   REHAB POTENTIAL: Good  CLINICAL DECISION MAKING: Stable/uncomplicated  EVALUATION COMPLEXITY: Low   GOALS: Goals  reviewed with patient? Yes  SHORT TERM GOALS: (target date for Short term goals are 3 weeks 09/06/2024)   1.  Patient will demonstrate independent use of home exercise program to maintain progress from in clinic treatments.  Goal status: Met  LONG TERM GOALS: (target dates for all long term goals are 10 weeks  10/25/2024 )   1. Patient will demonstrate/report pain at worst less than or equal to 2/10 to facilitate minimal limitation in daily activity secondary to pain symptoms.  Goal status: on going 08/30/2024   2. Patient will demonstrate independent use of home exercise program to facilitate ability to maintain/progress functional gains from skilled physical therapy services.  Goal status: on going 08/30/2024   3. Patient will demonstrate Patient specific functional scale avg > or = 8/10 to indicate reduced disability due to condition.   Goal status: on going 08/30/2024   4.  Patient will demonstrate Rt LE MMT 5/5 throughout to faciltiate usual transfers, stairs, squatting at [facility] for daily life.   Goal status: on going 08/30/2024   5.  Patient will demonstrate independent ambulation community distances > 500 ft to  facilitate community integration.  Goal status: on going 08/30/2024   6.  Patient will demonstrate TUG < 14 seconds independent to reduce fall risk.  Goal status: on going 08/30/2024   7.  Patient will demonstrate ascending/descending stairs reciprocally s UE assist for community integration.   Goal Status: on going 08/30/2024   PLAN:  PT FREQUENCY: 1-2x/week  PT DURATION: 10 weeks  PLANNED INTERVENTIONS: Can include 02853- PT Re-evaluation, 97110-Therapeutic exercises, 97530- Therapeutic activity, 97112- Neuromuscular re-education, 97535- Self Care, [id number]- Manual therapy, [phone]- Gait training,[id number]- Electrical stimulation (unattended), [id number] Physical performance testing  Patient/Family education, Balance training, Stair training, Taping, Dry Needling, Joint mobilization, Joint manipulation, Spinal manipulation, Spinal mobilization, Scar mobilization, Vestibular training, Visual/preceptual remediation/compensation, DME instructions, Cryotherapy, and Moist heat.  All performed as medically necessary.  All included unless contraindicated  PLAN FOR NEXT SESSION: Strength and stabiltiy.    Ozell Silvan, PT, DPT, OCS, ATC 09/06/24  12:22 PM      Referring diagnosis? S03.358 (ICD-10-CM) - Status post total replacement of right hip Treatment diagnosis? (if different than referring diagnosis) M25.551, M62.81, R 26.2 What was this (referring dx) caused by? [x]  Surgery []  Fall []  Ongoing issue []  Arthritis []  Other: ____________  Laterality: [x]  Rt []  Lt []  Both  Check all possible CPT codes:  *CHOOSE 10 OR LESS*    See Planned Interventions listed in the Plan section of the Evaluation.

## 2024-09-08 ENCOUNTER — Encounter: Payer: Self-pay | Admitting: Rehabilitative and Restorative Service Providers"

## 2024-09-08 ENCOUNTER — Ambulatory Visit (INDEPENDENT_AMBULATORY_CARE_PROVIDER_SITE_OTHER): Admitting: Rehabilitative and Restorative Service Providers"

## 2024-09-08 DIAGNOSIS — M25551 Pain in right hip: Secondary | ICD-10-CM | POA: Diagnosis not present

## 2024-09-08 DIAGNOSIS — R262 Difficulty in walking, not elsewhere classified: Secondary | ICD-10-CM | POA: Diagnosis not present

## 2024-09-08 DIAGNOSIS — M6281 Muscle weakness (generalized): Secondary | ICD-10-CM | POA: Diagnosis not present

## 2024-09-08 NOTE — Therapy (Signed)
 OUTPATIENT PHYSICAL THERAPY TREATMENT  Patient Name: Teresa Martinez MRN: [id number] DOB:[birth year], 83 y.o., female Today's Date: 09/08/2024  END OF SESSION:  PT End of Session - 09/08/24 1153     Visit Number 8    Number of Visits 20    Date for Recertification  10/25/24    Authorization Type [employer] $20 copay    Authorization Time Period 9/29-12/8; 10 visits    Authorization - Visit Number 8    Authorization - Number of Visits 10    Progress Note Due on Visit 10    PT Start Time 1138    PT Stop Time 1218    PT Time Calculation (min) 40 min    Equipment Utilized During Treatment Gait belt    Activity Tolerance Patient tolerated treatment well    Behavior During Therapy WFL for tasks assessed/performed                 Past Medical History:  Diagnosis Date   Arthritis    Cervical spine degeneration    C5/C6   Chronic right shoulder pain    GERD (gastroesophageal reflux disease)    Hemiparesis affecting dominant side as late effect of cerebrovascular accident (HCC) 12/07/2014   Hyperlipemia    Hypertension    Menopausal syndrome    [name]   Overactive bladder    Statin intolerance    Stroke (HCC) 2015   weakness on right side   Varicose vein of leg    left leg   Past Surgical History:  Procedure Laterality Date   ABDOMINAL HYSTERECTOMY     1985   BAND HEMORRHOIDECTOMY     2010   CATARACT EXTRACTION, BILATERAL     CHOLECYSTECTOMY     1950   LUMBAR LAMINECTOMY Bilateral 08/03/2018   Dr. Wallene, L3 laminotomy, L4-L5 laminectomy with neural foraminal decompression.   PAROTIDECTOMY     30 years ago   TOTAL HIP ARTHROPLASTY Right 07/30/2024   Procedure: ARTHROPLASTY, HIP, TOTAL, ANTERIOR APPROACH;  Surgeon: Vernetta Lonni GRADE, MD;  Location: [facility];  Service: Orthopedics;  Laterality: Right;   TOTAL KNEE ARTHROPLASTY Left 09/10/2022   Procedure: LEFT TOTAL KNEE ARTHROPLASTY;  Surgeon: Vernetta Lonni GRADE, MD;  Location: [facility]  OR;  Service: Orthopedics;  Laterality: Left;   Patient Active Problem List   Diagnosis Date Noted   Status post total replacement of right hip 07/30/2024   Status post left knee replacement 09/10/2022   IGT (impaired glucose tolerance) 07/27/2021   Preop testing 07/09/2018   Degenerative lumbar spinal stenosis 05/12/2018   Statin myopathy 08/29/2016   Iliotibial band syndrome of right side 04/23/2016   Acromioclavicular arthrosis 06/30/2015   Adhesive capsulitis of right shoulder 02/06/2015   Dysarthria due to cerebrovascular accident 01/09/2015   Hemiparesis affecting dominant side as late effect of cerebrovascular accident (HCC) 12/07/2014   Spastic neurogenic bladder 10/31/2014   Right rotator cuff tendonitis 10/19/2014   Stenosis of cervical spine region    Cerebral infarction due to thrombosis of left middle cerebral artery (HCC)    Left pontine stroke (HCC)    TIA (transient ischemic attack) 10/14/2014   Overactive bladder 10/14/2014   H/O: CVA (cerebrovascular accident) 10/14/2014   Essential hypertension 10/10/2009   INSOMNIA 09/15/2009   Pure hypercholesterolemia 11/28/2008   MENOPAUSAL SYNDROME 11/28/2008    PCP: Theophilus Andrews MD  REFERRING PROVIDER: Vernetta Lonni GRADE, MD  REFERRING DIAG: [phone] (ICD-10-CM) - Status post total replacement of right hip  THERAPY DIAG:  Pain  in right hip  Muscle weakness (generalized)  Difficulty in walking, not elsewhere classified  Rationale for Evaluation and Treatment: Rehabilitation  ONSET DATE: 07/30/2024 surgery Rt THA  SUBJECTIVE:   SUBJECTIVE STATEMENT: Pt indicated some soreness in thigh and some complaints in hip when sitting.  Reported not painful.   Getting up after sitting can be some stiffness.  Reported driving today.   PERTINENT HISTORY: PMH: CVA with Rt hemiparesis, HTN, neurogenic bladder.  Lt TKA 09/10/2022   PAIN:  NPRS scale: soreness not pain.  Pain location: anterior,  posterior/lateral, even into thigh.  Pain description: soreness Aggravating factors: lying on Rt side, WB pressure.  Relieving factors: sit/rest. OTC medicine.   PRECAUTIONS: Anterior hip  WEIGHT BEARING RESTRICTIONS: No  FALLS:  Has patient fallen in last 6 months? No  LIVING ENVIRONMENT: Lives with: primary alone but has children staying at this time.  Lives in: House/apartment Stairs:5 to enter at front, 2 in back with handrail on Lt  Has following equipment at home:FWW, SPC  OCCUPATION: Retired  PLOF: Independent, hobbies - read, exercise routine   PATIENT GOALS: walk, drive  OBJECTIVE:   PATIENT SURVEYS:  Patient-Specific Activity Scoring Scheme  0 represents "unable to perform." 10 represents "able to perform at prior level. 0 1 2 3 4 5 6 7 8 9  10 (Date and Score)   Activity Eval  08/16/2024  09/06/2024  1. Driving  0 8   2. Walking  4 8   3. Cooking 2 10  4. laundry 2 7  5. House cleaning 5 5  Score 2.6avg 7.6   Total score = sum of the activity scores/number of activities Minimum detectable change (90%CI) for average score = 2 points Minimum detectable change (90%CI) for single activity score = 3 points  COGNITION: 08/16/2024 Overall cognitive status: WFL    SENSATION: 08/16/2024 No impairment observed in dermatomes.   MUSCLE LENGTH: 08/16/2024 No specific testing.   POSTURE:  08/16/2024 Mild weight shift off Rt leg to Lt in standing.   PALPATION: 08/16/2024 Tenderness to light touch, trigger points/muscle tenderness in Rt glute max, med, min, anterior/lateral quad Rt.   LOWER EXTREMITY ROM:  08/16/2024: Held formalized ROM testing today   ROM Right Eval 08/16/2024 Left Eval 08/16/2024  Hip flexion    Hip extension    Hip abduction    Hip adduction    Hip internal rotation    Hip external rotation    Knee flexion    Knee extension    Ankle dorsiflexion    Ankle plantarflexion    Ankle inversion    Ankle eversion     (Blank rows  = not tested)  LOWER EXTREMITY MMT:  MMT Right Eval 08/16/2024 Left Eval 08/16/2024 Right 08/30/2024  Hip flexion 4/5 5/5 5/5  Hip extension     Hip abduction 3+/5    Hip adduction     Hip internal rotation     Hip external rotation     Knee flexion 5/5 5/5 5/5  Knee extension 4/5 5/5 5/5  Ankle dorsiflexion 4+/5 5/5 5/5  Ankle plantarflexion     Ankle inversion     Ankle eversion      (Blank rows = not tested)  LOWER EXTREMITY SPECIAL TESTS:  08/16/2024 No   FUNCTIONAL TESTS:  08/25/2024:  TUG with SPC : 20 seconds.   08/16/2024 18 inch chair transfer: unable s UE assist  Lt SLS: not tested  Rt SLS: unable  TUG with FWW;  21.62 seconds  GAIT: 09/06/2024: Ambulation with SPC to clinic.  Able to do short distances in clinic independent.   08/16/2024 Household distances in clinic with FWW with step through gait pattern.                                                                                                                                                                         TODAY'S TREATMENT     DATE:  09/08/2024 Therex: Nustep lvl 5 UE/LE for ROM, endurance - 10 mins  Incline gastroc stretch 30 sec x 3 bilateral    TherActivity: (to improve ambulation, stairs, squatting, transfers) Leg press double leg 100 lbs 2 x 15 slow lowering focus Leg press single leg 56 lbs 2 x 15 bilateral Squat to 8 inch box with 5 lb kettle bell x 10 with cues for positioning.   Neuro Re-ed 6 inch hurdle step to pattern in // bars with occasional HHA with SBA.  10 ft x 4 leading with each LE first.  Lateral stepping over 6 inch hurdle in // bars with occasional to moderate HHA 10 ft x 3 with SBA   Manual: Percussive device to Rt quad middle and distally, lateral thigh for trigger points and soft tissue mobility.  Tennis ball use for same goal.  Education for home use with tennis ball provided.    TODAY'S TREATMENT     DATE:  09/06/2024 Therex: Nustep lvl 6 UE/LE for  ROM, endurance - 10 mins  Seated Rt leg LAQ 4 lbs 3 x 8   TherActivity: (to improve ambulation, stairs, squatting, transfers) Leg press double leg 100 lbs 2 x 15 slow lowering focus Leg press single leg 56 lbs 2 x 15 bilateral Forward step up WB on Rt leg 4 inch step with SBA to min A at times for LOB.  X 10 Lateral step down 4 inch step WB on Rt leg x 10 with SBA  Neuro Re-ed (balance improvements, muscle activation) Modified tandem stance foot forward with leg elevated with SBA 1 min x 1 bilateral Standing alternating toe tapping 4 inch step x 10 bilateral with SBA  Lateral stepping 3 cones x 6 each way , performed bilaterally with CGA to min A with gait belt.     TODAY'S TREATMENT     DATE:  09/01/2024 Therex: Nustep lvl 6 UE/LE for ROM, endurance - 10 mins  Lateral stepping green band around mid lower leg in // bars 10 ft x 4 each way  - hands on bar for balance.    TherActivity: (to improve ambulation, stairs, squatting, transfers) Seated driving replication bosu ball Rt leg press reactive to blazepod green/red light 30 seconds x 6 with various light on times.  Leg press double  leg 87 lbs x 15 slow lowering focus Leg press single leg 43 lbs 2 x 15 bilateral   Neuro Re-ed (balance improvements, muscle activation) Tandem stance 1 min x 2 bilateral on foam in // bars with occasional to moderate HHA Feet together stance on foam 1 min with SBA and occasional HHA     TODAY'S TREATMENT     DATE:  08/30/2024 Therex: Nustep lvl 6 UE/LE for ROM, endurance - 10 mins  Incline gastroc stretch 30 sec x 3 bilateral    TherActivity: (to improve ambulation, stairs, squatting, transfers) Seated driving replication bosu ball Rt leg press reactive to blazepod green/red light 30 seconds x 6 with various light on times.  Additional time spent in education of return to driving advice with trial in open area for practice.  Lateral stepping 4 inch 2 x 10 bilaterally with light hand assist on  bar.    Neuro Re-ed (balance improvements, muscle activation) Retro step x 15 bilaterally in // bars with occasional HHA, SBA Alternate toe tapping 8 inch box x 10 bilateral with CGA to min A    TODAY'S TREATMENT     DATE:  08/25/2024 Therex: Nustep lvl 6 UE/LE for ROM, endurance - 10 mins  Seated quad set with SLR x 10 bilateral with slow control focus , 2-3 inches off floor.   TherActivity: (to improve ambulation, stairs, squatting, transfers) Leg press double leg 81 lbs x 15 slow lowering focus Leg press single leg 37 lbs 2 x 15 bilateral Sit to stand to sit 18 inch chair s UE assist x 10  Step on over and down WB on Rt leg with Lt hand rail assist x 12    Neuro Re-ed (balance improvements, muscle activation) Tandem stance 1 min x 2 bilateral with occasional HHA  Tandem ambulation in // bars fwd/back 10 ft x 4 each way with moderate amount of HHA during movement, SBA.    PATIENT EDUCATION:  08/16/2024 Education details: HEP, POC Person educated: Patient Education method: Programmer, multimedia, Demonstration, Verbal cues, and Handouts Education comprehension: verbalized understanding, returned demonstration, and verbal cues required  HOME EXERCISE PROGRAM: Access Code: [email or web address] URL: [email or web address] Date: 08/16/2024 Prepared by: Ozell Silvan  Exercises - Supine Bridge  - 1-2 x daily - 7 x weekly - 1-2 sets - 10 reps - 2 hold - Seated Quad Set  - 2-3 x daily - 7 x weekly - 1 sets - 10 reps - 5 hold - Clamshell (Mirrored)  - 1-2 x daily - 7 x weekly - 2-3 sets - 10-15 reps - Seated Isometric Knee Extension  - 2-3 x daily - 7 x weekly - 1 sets - 10 reps - 5-10 hold - Retro Step  - 1-2 x daily - 7 x weekly - 1 sets - 10-20 reps - [place]  - 1-2 x daily - 7 x weekly - 1 sets - 1-2 mins hold  ASSESSMENT:  CLINICAL IMPRESSION: Time spent in clinic for soft tissue mobilization and incision desensitization review for home.  Continued focus on gaining strength  and balance control.  Continued skilled PT services indicated at this time.    OBJECTIVE IMPAIRMENTS: Abnormal gait, decreased activity tolerance, decreased balance, decreased coordination, decreased endurance, decreased mobility, difficulty walking, decreased ROM, decreased strength, increased fascial restrictions, impaired perceived functional ability, increased muscle spasms, impaired flexibility, improper body mechanics, and pain.   ACTIVITY LIMITATIONS: carrying, lifting, bending, sitting, standing, squatting, sleeping, stairs, transfers, bed mobility, dressing, hygiene/grooming, and locomotion level  PARTICIPATION LIMITATIONS: meal prep, cleaning, laundry, interpersonal relationship, driving, shopping, and community activity  PERSONAL FACTORS: PMH: CVA with Rt hemiparesis, HTN, neurogenic bladder Lt TKA 09/10/2022,  are also affecting patient's functional outcome.   REHAB POTENTIAL: Good  CLINICAL DECISION MAKING: Stable/uncomplicated  EVALUATION COMPLEXITY: Low   GOALS: Goals reviewed with patient? Yes  SHORT TERM GOALS: (target date for Short term goals are 3 weeks 09/06/2024)   1.  Patient will demonstrate independent use of home exercise program to maintain progress from in clinic treatments.  Goal status: Met  LONG TERM GOALS: (target dates for all long term goals are 10 weeks  10/25/2024 )   1. Patient will demonstrate/report pain at worst less than or equal to 2/10 to facilitate minimal limitation in daily activity secondary to pain symptoms.  Goal status: on going 08/30/2024   2. Patient will demonstrate independent use of home exercise program to facilitate ability to maintain/progress functional gains from skilled physical therapy services.  Goal status: on going 08/30/2024   3. Patient will demonstrate Patient specific functional scale avg > or = 8/10 to indicate reduced disability due to condition.   Goal status: on going 08/30/2024   4.  Patient will  demonstrate Rt LE MMT 5/5 throughout to faciltiate usual transfers, stairs, squatting at [facility] for daily life.   Goal status: on going 08/30/2024   5.  Patient will demonstrate independent ambulation community distances > 500 ft to facilitate community integration.  Goal status: on going 08/30/2024   6.  Patient will demonstrate TUG < 14 seconds independent to reduce fall risk.  Goal status: on going 08/30/2024   7.  Patient will demonstrate ascending/descending stairs reciprocally s UE assist for community integration.   Goal Status: on going 08/30/2024   PLAN:  PT FREQUENCY: 1-2x/week  PT DURATION: 10 weeks  PLANNED INTERVENTIONS: Can include 02853- PT Re-evaluation, 97110-Therapeutic exercises, 97530- Therapeutic activity, 97112- Neuromuscular re-education, 97535- Self Care, [id number]- Manual therapy, [phone]- Gait training,[id number]- Electrical stimulation (unattended), 97750 Physical performance testing  Patient/Family education, Balance training, Stair training, Taping, Dry Needling, Joint mobilization, Joint manipulation, Spinal manipulation, Spinal mobilization, Scar mobilization, Vestibular training, Visual/preceptual remediation/compensation, DME instructions, Cryotherapy, and Moist heat.  All performed as medically necessary.  All included unless contraindicated  PLAN FOR NEXT SESSION: Stairs.    Ozell Silvan, PT, DPT, OCS, ATC 09/08/24  12:19 PM      Referring diagnosis? S03.358 (ICD-10-CM) - Status post total replacement of right hip Treatment diagnosis? (if different than referring diagnosis) M25.551, M62.81, R 26.2 What was this (referring dx) caused by? [x]  Surgery []  Fall []  Ongoing issue []  Arthritis []  Other: ____________  Laterality: [x]  Rt []  Lt []  Both  Check all possible CPT codes:  *CHOOSE 10 OR LESS*    See Planned Interventions listed in the Plan section of the Evaluation.

## 2024-09-09 ENCOUNTER — Other Ambulatory Visit: Payer: Self-pay | Admitting: Internal Medicine

## 2024-09-09 DIAGNOSIS — I10 Essential (primary) hypertension: Secondary | ICD-10-CM

## 2024-09-13 ENCOUNTER — Encounter: Payer: Self-pay | Admitting: Rehabilitative and Restorative Service Providers"

## 2024-09-13 ENCOUNTER — Ambulatory Visit (INDEPENDENT_AMBULATORY_CARE_PROVIDER_SITE_OTHER): Admitting: Rehabilitative and Restorative Service Providers"

## 2024-09-13 DIAGNOSIS — R262 Difficulty in walking, not elsewhere classified: Secondary | ICD-10-CM

## 2024-09-13 DIAGNOSIS — M6281 Muscle weakness (generalized): Secondary | ICD-10-CM

## 2024-09-13 DIAGNOSIS — M25551 Pain in right hip: Secondary | ICD-10-CM

## 2024-09-13 NOTE — Therapy (Signed)
 OUTPATIENT PHYSICAL THERAPY TREATMENT  Patient Name: Teresa Martinez MRN: [id number] DOB:1941/06/04, 83 y.o., female Today's Date: 09/13/2024  END OF SESSION:  PT End of Session - 09/13/24 1136     Visit Number 9    Number of Visits 20    Date for Recertification  10/25/24    Authorization Type [employer] $20 copay    Authorization Time Period 9/29-12/8; 10 visits    Authorization - Number of Visits 10    Progress Note Due on Visit 10    PT Start Time 1137    PT Stop Time 1217    PT Time Calculation (min) 40 min    Activity Tolerance Patient tolerated treatment well    Behavior During Therapy [facility] for tasks assessed/performed                  Past Medical History:  Diagnosis Date   Arthritis    Cervical spine degeneration    C5/C6   Chronic right shoulder pain    GERD (gastroesophageal reflux disease)    Hemiparesis affecting dominant side as late effect of cerebrovascular accident (HCC) 12/07/2014   Hyperlipemia    Hypertension    Menopausal syndrome    [name]   Overactive bladder    Statin intolerance    Stroke (HCC) 2015   weakness on right side   Varicose vein of leg    left leg   Past Surgical History:  Procedure Laterality Date   ABDOMINAL HYSTERECTOMY     1985   BAND HEMORRHOIDECTOMY     2010   CATARACT EXTRACTION, BILATERAL     CHOLECYSTECTOMY     1950   LUMBAR LAMINECTOMY Bilateral 08/03/2018   Dr. Wallene, L3 laminotomy, L4-L5 laminectomy with neural foraminal decompression.   PAROTIDECTOMY     30 years ago   TOTAL HIP ARTHROPLASTY Right 07/30/2024   Procedure: ARTHROPLASTY, HIP, TOTAL, ANTERIOR APPROACH;  Surgeon: Vernetta Lonni GRADE, MD;  Location: [facility];  Service: Orthopedics;  Laterality: Right;   TOTAL KNEE ARTHROPLASTY Left 09/10/2022   Procedure: LEFT TOTAL KNEE ARTHROPLASTY;  Surgeon: Vernetta Lonni GRADE, MD;  Location: [facility] OR;  Service: Orthopedics;  Laterality: Left;   Patient Active Problem List    Diagnosis Date Noted   Status post total replacement of right hip 07/30/2024   Status post left knee replacement 09/10/2022   IGT (impaired glucose tolerance) 07/27/2021   Preop testing 07/09/2018   Degenerative lumbar spinal stenosis 05/12/2018   Statin myopathy 08/29/2016   Iliotibial band syndrome of right side 04/23/2016   Acromioclavicular arthrosis 06/30/2015   Adhesive capsulitis of right shoulder 02/06/2015   Dysarthria due to cerebrovascular accident 01/09/2015   Hemiparesis affecting dominant side as late effect of cerebrovascular accident (HCC) 12/07/2014   Spastic neurogenic bladder 10/31/2014   Right rotator cuff tendonitis 10/19/2014   Stenosis of cervical spine region    Cerebral infarction due to thrombosis of left middle cerebral artery (HCC)    Left pontine stroke (HCC)    TIA (transient ischemic attack) 10/14/2014   Overactive bladder 10/14/2014   H/O: CVA (cerebrovascular accident) 10/14/2014   Essential hypertension 10/10/2009   INSOMNIA 09/15/2009   Pure hypercholesterolemia 11/28/2008   MENOPAUSAL SYNDROME 11/28/2008    PCP: Theophilus Andrews MD  REFERRING PROVIDER: Vernetta Lonni GRADE, MD  REFERRING DIAG: [phone] (ICD-10-CM) - Status post total replacement of right hip  THERAPY DIAG:  Pain in right hip  Muscle weakness (generalized)  Difficulty in walking, not elsewhere classified  Rationale  for Evaluation and Treatment: Rehabilitation  ONSET DATE: 07/30/2024 surgery Rt THA  SUBJECTIVE:   SUBJECTIVE STATEMENT: Pt indicated the soreness in hip doesn't seem to worsening or improve with activity in HEP.  Denied specific pain, just described soreness.   PERTINENT HISTORY: PMH: CVA with Rt hemiparesis, HTN, neurogenic bladder.  Lt TKA 09/10/2022   PAIN:  NPRS scale: soreness not pain.  Pain location: anterior, posterior/lateral, even into thigh.  Pain description: soreness Aggravating factors: lying on Rt side, WB pressure.  Relieving  factors: sit/rest. OTC medicine.   PRECAUTIONS: Anterior hip  WEIGHT BEARING RESTRICTIONS: No  FALLS:  Has patient fallen in last 6 months? No  LIVING ENVIRONMENT: Lives with: primary alone but has children staying at this time.  Lives in: House/apartment Stairs:5 to enter at front, 2 in back with handrail on Lt  Has following equipment at home:FWW, SPC  OCCUPATION: Retired  PLOF: Independent, hobbies - read, exercise routine   PATIENT GOALS: walk, drive  OBJECTIVE:   PATIENT SURVEYS:  Patient-Specific Activity Scoring Scheme  0 represents "unable to perform." 10 represents "able to perform at prior level. 0 1 2 3 4 5 6 7 8 9  10 (Date and Score)   Activity Eval  08/16/2024  09/06/2024  1. Driving  0 8   2. Walking  4 8   3. Cooking 2 10  4. laundry 2 7  5. House cleaning 5 5  Score 2.6avg 7.6   Total score = sum of the activity scores/number of activities Minimum detectable change (90%CI) for average score = 2 points Minimum detectable change (90%CI) for single activity score = 3 points  COGNITION: 08/16/2024 Overall cognitive status: WFL    SENSATION: 08/16/2024 No impairment observed in dermatomes.   MUSCLE LENGTH: 08/16/2024 No specific testing.   POSTURE:  08/16/2024 Mild weight shift off Rt leg to Lt in standing.   PALPATION: 08/16/2024 Tenderness to light touch, trigger points/muscle tenderness in Rt glute max, med, min, anterior/lateral quad Rt.   LOWER EXTREMITY ROM:  08/16/2024: Held formalized ROM testing today   ROM Right Eval 08/16/2024 Left Eval 08/16/2024  Hip flexion    Hip extension    Hip abduction    Hip adduction    Hip internal rotation    Hip external rotation    Knee flexion    Knee extension    Ankle dorsiflexion    Ankle plantarflexion    Ankle inversion    Ankle eversion     (Blank rows = not tested)  LOWER EXTREMITY MMT:  MMT Right Eval 08/16/2024 Left Eval 08/16/2024 Right 08/30/2024  Hip flexion 4/5 5/5  5/5  Hip extension     Hip abduction 3+/5    Hip adduction     Hip internal rotation     Hip external rotation     Knee flexion 5/5 5/5 5/5  Knee extension 4/5 5/5 5/5  Ankle dorsiflexion 4+/5 5/5 5/5  Ankle plantarflexion     Ankle inversion     Ankle eversion      (Blank rows = not tested)  LOWER EXTREMITY SPECIAL TESTS:  08/16/2024 No   FUNCTIONAL TESTS:   09/13/2024: TUG with SPC: 14.2 seconds TUG independently : 12.20 seconds 18 inch chair transfer : 1st try without UE assist.   08/25/2024:  TUG with SPC : 20 seconds.   08/16/2024 18 inch chair transfer: unable s UE assist  Lt SLS: not tested  Rt SLS: unable  TUG with FWW;  21.62 seconds  GAIT: 09/06/2024: Ambulation with SPC to clinic.  Able to do short distances in clinic independent.   08/16/2024 Household distances in clinic with FWW with step through gait pattern.                                                                                                                                                                         TODAY'S TREATMENT     DATE:  09/13/2024 Therex: Nustep lvl 6 UE/LE for ROM, endurance - 10 mins  Supine bridge 2-3 sec hold 2 x 10  Supine hooklying clam shell green band 2 x 20 bilateral with isometric leg holding steady.  Supine SKC 30 sec x 3 bilaterally   Neuro Re-ed Forward stepping weight shift x 15 bilateral with mirror feedback Retro stepping weight shift x 15 bilateral in // bars with occasional HHA, mirror feedback Fwd/back ambulation 10 ft in // bars with focus on equal step lengths bilateral. 10 ft x 5 each way with supervision. Banker.   TherActivity Flight of stairs up/down with reciprocal gait pattern with supervision and bilateral hand rail assist.  Performed going down full flight with reciprocal gait pattern and Lt hand on rail only    TODAY'S TREATMENT                                                         DATE:  09/08/2024 Therex: Nustep lvl 5  UE/LE for ROM, endurance - 10 mins  Incline gastroc stretch 30 sec x 3 bilateral      TherActivity: (to improve ambulation, stairs, squatting, transfers) Leg press double leg 100 lbs 2 x 15 slow lowering focus Leg press single leg 56 lbs 2 x 15 bilateral Squat to 8 inch box with 5 lb kettle bell x 10 with cues for positioning.    Neuro Re-ed 6 inch hurdle step to pattern in // bars with occasional HHA with SBA.  10 ft x 4 leading with each LE first.  Lateral stepping over 6 inch hurdle in // bars with occasional to moderate HHA 10 ft x 3 with SBA    Manual: Percussive device to Rt quad middle and distally, lateral thigh for trigger points and soft tissue mobility.  Tennis ball use for same goal.  Education for home use with tennis ball provided.    TODAY'S TREATMENT     DATE:  09/06/2024 Therex: Nustep lvl 6 UE/LE for ROM, endurance - 10 mins  Seated Rt leg LAQ 4 lbs 3 x 8   TherActivity: (to improve ambulation, stairs,  squatting, transfers) Leg press double leg 100 lbs 2 x 15 slow lowering focus Leg press single leg 56 lbs 2 x 15 bilateral Forward step up WB on Rt leg 4 inch step with SBA to min A at times for LOB.  X 10 Lateral step down 4 inch step WB on Rt leg x 10 with SBA  Neuro Re-ed (balance improvements, muscle activation) Modified tandem stance foot forward with leg elevated with SBA 1 min x 1 bilateral Standing alternating toe tapping 4 inch step x 10 bilateral with SBA  Lateral stepping 3 cones x 6 each way , performed bilaterally with CGA to min A with gait belt.     TODAY'S TREATMENT     DATE:  09/01/2024 Therex: Nustep lvl 6 UE/LE for ROM, endurance - 10 mins  Lateral stepping green band around mid lower leg in // bars 10 ft x 4 each way  - hands on bar for balance.    TherActivity: (to improve ambulation, stairs, squatting, transfers) Seated driving replication bosu ball Rt leg press reactive to blazepod green/red light 30 seconds x 6 with various light on  times.  Leg press double leg 87 lbs x 15 slow lowering focus Leg press single leg 43 lbs 2 x 15 bilateral   Neuro Re-ed (balance improvements, muscle activation) Tandem stance 1 min x 2 bilateral on foam in // bars with occasional to moderate HHA Feet together stance on foam 1 min with SBA and occasional HHA   PATIENT EDUCATION:  08/16/2024 Education details: HEP, POC Person educated: Patient Education method: Programmer, Multimedia, Demonstration, Verbal cues, and Handouts Education comprehension: verbalized understanding, returned demonstration, and verbal cues required  HOME EXERCISE PROGRAM: Access Code: [email or web address] URL: [email or web address] Date: 08/16/2024 Prepared by: Ozell Silvan  Exercises - Supine Bridge  - 1-2 x daily - 7 x weekly - 1-2 sets - 10 reps - 2 hold - Seated Quad Set  - 2-3 x daily - 7 x weekly - 1 sets - 10 reps - 5 hold - Clamshell (Mirrored)  - 1-2 x daily - 7 x weekly - 2-3 sets - 10-15 reps - Seated Isometric Knee Extension  - 2-3 x daily - 7 x weekly - 1 sets - 10 reps - 5-10 hold - Retro Step  - 1-2 x daily - 7 x weekly - 1 sets - 10-20 reps - [place]  - 1-2 x daily - 7 x weekly - 1 sets - 1-2 mins hold  ASSESSMENT:  CLINICAL IMPRESSION: Pt discussed with MD office about swelling/soreness around incision over the last few days.  No evidence of worsening with physical activity within clinic or HEP per Pt report.  Continued work on improving WB acceptance and control to help improve ambulation control without cane.   OBJECTIVE IMPAIRMENTS: Abnormal gait, decreased activity tolerance, decreased balance, decreased coordination, decreased endurance, decreased mobility, difficulty walking, decreased ROM, decreased strength, increased fascial restrictions, impaired perceived functional ability, increased muscle spasms, impaired flexibility, improper body mechanics, and pain.   ACTIVITY LIMITATIONS: carrying, lifting, bending, sitting, standing,  squatting, sleeping, stairs, transfers, bed mobility, dressing, hygiene/grooming, and locomotion level  PARTICIPATION LIMITATIONS: meal prep, cleaning, laundry, interpersonal relationship, driving, shopping, and community activity  PERSONAL FACTORS: PMH: CVA with Rt hemiparesis, HTN, neurogenic bladder Lt TKA 09/10/2022,  are also affecting patient's functional outcome.   REHAB POTENTIAL: Good  CLINICAL DECISION MAKING: Stable/uncomplicated  EVALUATION COMPLEXITY: Low   GOALS: Goals reviewed with patient? Yes  SHORT TERM GOALS: (target date for Short  term goals are 3 weeks 09/06/2024)   1.  Patient will demonstrate independent use of home exercise program to maintain progress from in clinic treatments.  Goal status: Met  LONG TERM GOALS: (target dates for all long term goals are 10 weeks  10/25/2024 )   1. Patient will demonstrate/report pain at worst less than or equal to 2/10 to facilitate minimal limitation in daily activity secondary to pain symptoms.  Goal status: on going 08/30/2024   2. Patient will demonstrate independent use of home exercise program to facilitate ability to maintain/progress functional gains from skilled physical therapy services.  Goal status: on going 08/30/2024   3. Patient will demonstrate Patient specific functional scale avg > or = 8/10 to indicate reduced disability due to condition.   Goal status: on going 08/30/2024   4.  Patient will demonstrate Rt LE MMT 5/5 throughout to faciltiate usual transfers, stairs, squatting at [facility] for daily life.   Goal status: on going 08/30/2024   5.  Patient will demonstrate independent ambulation community distances > 500 ft to facilitate community integration.  Goal status: on going 08/30/2024   6.  Patient will demonstrate TUG < 14 seconds independent to reduce fall risk.  Goal status: on going 08/30/2024   7.  Patient will demonstrate ascending/descending stairs reciprocally s UE assist for community  integration.   Goal Status: on going 08/30/2024   PLAN:  PT FREQUENCY: 1-2x/week  PT DURATION: 10 weeks  PLANNED INTERVENTIONS: Can include 02853- PT Re-evaluation, 97110-Therapeutic exercises, 97530- Therapeutic activity, 97112- Neuromuscular re-education, 97535- Self Care, [id number]- Manual therapy, [id number]- Gait training,[id number]- Electrical stimulation (unattended), 97750 Physical performance testing  Patient/Family education, Balance training, Stair training, Taping, Dry Needling, Joint mobilization, Joint manipulation, Spinal manipulation, Spinal mobilization, Scar mobilization, Vestibular training, Visual/preceptual remediation/compensation, DME instructions, Cryotherapy, and Moist heat.  All performed as medically necessary.  All included unless contraindicated  PLAN FOR NEXT SESSION: 10th visit progress note. MYLENE Laws, MD note.    Ozell Silvan, PT, DPT, OCS, ATC 09/13/24  12:20 PM       Referring diagnosis? S03.358 (ICD-10-CM) - Status post total replacement of right hip Treatment diagnosis? (if different than referring diagnosis) M25.551, M62.81, R 26.2 What was this (referring dx) caused by? [x]  Surgery []  Fall []  Ongoing issue []  Arthritis []  Other: ____________  Laterality: [x]  Rt []  Lt []  Both  Check all possible CPT codes:  *CHOOSE 10 OR LESS*    See Planned Interventions listed in the Plan section of the Evaluation.

## 2024-09-15 NOTE — Therapy (Signed)
 OUTPATIENT PHYSICAL THERAPY TREATMENT/ PROGRESS NOTE  Patient Name: Teresa Martinez MRN: [id number] DOB:[birth year], 83 y.o., female Today's Date: 09/16/2024   Progress Note Reporting Period 08/16/2024 to 09/16/2024  See note below for Objective Data and Assessment of Progress/Goals.      END OF SESSION:  PT End of Session - 09/16/24 1420     Visit Number 10    Number of Visits 20    Date for Recertification  10/25/24    Authorization Type [employer] $20 copay    Authorization Time Period 9/29-12/8; 10 visits    Authorization - Number of Visits 10    Progress Note Due on Visit 20    PT Start Time 1428    PT Stop Time 1509    PT Time Calculation (min) 41 min    Activity Tolerance Patient tolerated treatment well    Behavior During Therapy WFL for tasks assessed/performed             Past Medical History:  Diagnosis Date   Arthritis    Cervical spine degeneration    C5/C6   Chronic right shoulder pain    GERD (gastroesophageal reflux disease)    Hemiparesis affecting dominant side as late effect of cerebrovascular accident (HCC) 12/07/2014   Hyperlipemia    Hypertension    Menopausal syndrome    [name]   Overactive bladder    Statin intolerance    Stroke (HCC) 2015   weakness on right side   Varicose vein of leg    left leg   Past Surgical History:  Procedure Laterality Date   ABDOMINAL HYSTERECTOMY     1985   BAND HEMORRHOIDECTOMY     2010   CATARACT EXTRACTION, BILATERAL     CHOLECYSTECTOMY     1950   LUMBAR LAMINECTOMY Bilateral 08/03/2018   Dr. Wallene, L3 laminotomy, L4-L5 laminectomy with neural foraminal decompression.   PAROTIDECTOMY     30 years ago   TOTAL HIP ARTHROPLASTY Right 07/30/2024   Procedure: ARTHROPLASTY, HIP, TOTAL, ANTERIOR APPROACH;  Surgeon: Vernetta Lonni GRADE, MD;  Location: [facility];  Service: Orthopedics;  Laterality: Right;   TOTAL KNEE ARTHROPLASTY Left 09/10/2022   Procedure: LEFT TOTAL KNEE  ARTHROPLASTY;  Surgeon: Vernetta Lonni GRADE, MD;  Location: [facility];  Service: Orthopedics;  Laterality: Left;   Patient Active Problem List   Diagnosis Date Noted   Status post total replacement of right hip 07/30/2024   Status post left knee replacement 09/10/2022   IGT (impaired glucose tolerance) 07/27/2021   Preop testing 07/09/2018   Degenerative lumbar spinal stenosis 05/12/2018   Statin myopathy 08/29/2016   Iliotibial band syndrome of right side 04/23/2016   Acromioclavicular arthrosis 06/30/2015   Adhesive capsulitis of right shoulder 02/06/2015   Dysarthria due to cerebrovascular accident 01/09/2015   Hemiparesis affecting dominant side as late effect of cerebrovascular accident (HCC) 12/07/2014   Spastic neurogenic bladder 10/31/2014   Right rotator cuff tendonitis 10/19/2014   Stenosis of cervical spine region    Cerebral infarction due to thrombosis of left middle cerebral artery (HCC)    Left pontine stroke (HCC)    TIA (transient ischemic attack) 10/14/2014   Overactive bladder 10/14/2014   H/O: CVA (cerebrovascular accident) 10/14/2014   Essential hypertension 10/10/2009   INSOMNIA 09/15/2009   Pure hypercholesterolemia 11/28/2008   MENOPAUSAL SYNDROME 11/28/2008    PCP: Theophilus Andrews MD  REFERRING PROVIDER: Vernetta Lonni GRADE, MD  REFERRING DIAG: [phone] (ICD-10-CM) - Status post total replacement of right hip  THERAPY DIAG:  Pain in right hip  Muscle weakness (generalized)  Difficulty in walking, not elsewhere classified  Chronic pain of left knee  Localized edema  Rationale for Evaluation and Treatment: Rehabilitation  ONSET DATE: 07/30/2024 surgery Rt THA  SUBJECTIVE:   SUBJECTIVE STATEMENT: Patient indicating no pain, but soreness and slight catching in anterior hip. Patient endorses c sign type soreness.   PERTINENT HISTORY: PMH: CVA with Rt hemiparesis, HTN, neurogenic bladder.  Lt TKA 09/10/2022   PAIN:  NPRS scale:  soreness not pain; not objectively rated   Pain location: anterior, posterior/lateral, even into thigh.  Pain description: soreness Aggravating factors: lying on Rt side, WB pressure.  Relieving factors: sit/rest. OTC medicine.   PRECAUTIONS: Anterior hip  WEIGHT BEARING RESTRICTIONS: No  FALLS:  Has patient fallen in last 6 months? No  LIVING ENVIRONMENT: Lives with: primary alone but has children staying at this time.  Lives in: House/apartment Stairs:5 to enter at front, 2 in back with handrail on Lt  Has following equipment at home:FWW, SPC  OCCUPATION: Retired  PLOF: Independent, hobbies - read, exercise routine   PATIENT GOALS: walk, drive  OBJECTIVE:   PATIENT SURVEYS:  Patient-Specific Activity Scoring Scheme  0 represents "unable to perform." 10 represents "able to perform at prior level. 0 1 2 3 4 5 6 7 8 9  10 (Date and Score)   Activity Eval  08/16/2024  09/06/2024 09/16/2024   1. Driving  0 8  10  2. Walking  4 8  8   3. Cooking 2 10 10   4. laundry 2 7 10   5. House cleaning 5 5 7   Score 2.6avg 7.6 9   Total score = sum of the activity scores/number of activities Minimum detectable change (90%CI) for average score = 2 points Minimum detectable change (90%CI) for single activity score = 3 points  COGNITION: 08/16/2024 Overall cognitive status: WFL    SENSATION: 08/16/2024 No impairment observed in dermatomes.   MUSCLE LENGTH: 08/16/2024 No specific testing.   POSTURE:  08/16/2024 Mild weight shift off Rt leg to Lt in standing.   PALPATION: 08/16/2024 Tenderness to light touch, trigger points/muscle tenderness in Rt glute max, med, min, anterior/lateral quad Rt.   LOWER EXTREMITY ROM:  08/16/2024: Held formalized ROM testing today   ROM Right Eval 08/16/2024 Left Eval 08/16/2024  Hip flexion    Hip extension    Hip abduction    Hip adduction    Hip internal rotation    Hip external rotation    Knee flexion    Knee extension     Ankle dorsiflexion    Ankle plantarflexion    Ankle inversion    Ankle eversion     (Blank rows = not tested)  LOWER EXTREMITY MMT:  MMT Right Eval 08/16/2024 Left Eval 08/16/2024 Right 08/30/2024  Hip flexion 4/5 5/5 5/5  Hip extension     Hip abduction 3+/5    Hip adduction     Hip internal rotation     Hip external rotation     Knee flexion 5/5 5/5 5/5  Knee extension 4/5 5/5 5/5  Ankle dorsiflexion 4+/5 5/5 5/5  Ankle plantarflexion     Ankle inversion     Ankle eversion      (Blank rows = not tested)  LOWER EXTREMITY SPECIAL TESTS:  08/16/2024 No   FUNCTIONAL TESTS:   09/13/2024: TUG with SPC: 14.2 seconds TUG independently : 12.20 seconds 18 inch chair transfer : 1st try without UE assist.  08/25/2024:  TUG with SPC : 20 seconds.   08/16/2024 18 inch chair transfer: unable s UE assist  Lt SLS: not tested  Rt SLS: unable  TUG with FWW;  21.62 seconds  09/16/2024 6 minute walk test: no AD for 836'3 5x STS: 12.57s no UE use from 18 arm chair   GAIT: 09/06/2024: Ambulation with SPC to clinic.  Able to do short distances in clinic independent.   08/16/2024 Household distances in clinic with FWW with step through gait pattern.                                                                                                                                                                         TODAY'S TREATMENT     DATE:  09/16/2024 Physical Performance: Patient completed 6-minute walk test with no AD with results noted above and discussed with patient  Patient completed 5xSTS with no UE use from 18 inch chair with results noted above and discussed with patient   TherEx:  Nustep level 6 for 5 minutes with bilat UE and LE  Bilat leg press 2x15 with 100#  Unilat leg press 2x15 with 56#  Knee extension machine with bilat up and unilat down 3x6 with each leg  Supine SLR with 3# ankle weight 2x12 each side    TODAY'S TREATMENT     DATE:   09/13/2024 Therex: Nustep lvl 6 UE/LE for ROM, endurance - 10 mins  Supine bridge 2-3 sec hold 2 x 10  Supine hooklying clam shell green band 2 x 20 bilateral with isometric leg holding steady.  Supine SKC 30 sec x 3 bilaterally   Neuro Re-ed Forward stepping weight shift x 15 bilateral with mirror feedback Retro stepping weight shift x 15 bilateral in // bars with occasional HHA, mirror feedback Fwd/back ambulation 10 ft in // bars with focus on equal step lengths bilateral. 10 ft x 5 each way with supervision. Banker.   TherActivity Flight of stairs up/down with reciprocal gait pattern with supervision and bilateral hand rail assist.  Performed going down full flight with reciprocal gait pattern and Lt hand on rail only    TODAY'S TREATMENT                                                         DATE:  09/08/2024 Therex: Nustep lvl 5 UE/LE for ROM, endurance - 10 mins  Incline gastroc stretch 30 sec x 3 bilateral      TherActivity: (to improve ambulation, stairs, squatting, transfers) Leg press double leg 100  lbs 2 x 15 slow lowering focus Leg press single leg 56 lbs 2 x 15 bilateral Squat to 8 inch box with 5 lb kettle bell x 10 with cues for positioning.    Neuro Re-ed 6 inch hurdle step to pattern in // bars with occasional HHA with SBA.  10 ft x 4 leading with each LE first.  Lateral stepping over 6 inch hurdle in // bars with occasional to moderate HHA 10 ft x 3 with SBA    Manual: Percussive device to Rt quad middle and distally, lateral thigh for trigger points and soft tissue mobility.  Tennis ball use for same goal.  Education for home use with tennis ball provided.    TODAY'S TREATMENT     DATE:  09/06/2024 Therex: Nustep lvl 6 UE/LE for ROM, endurance - 10 mins  Seated Rt leg LAQ 4 lbs 3 x 8   TherActivity: (to improve ambulation, stairs, squatting, transfers) Leg press double leg 100 lbs 2 x 15 slow lowering focus Leg press single leg 56 lbs 2 x 15  bilateral Forward step up WB on Rt leg 4 inch step with SBA to min A at times for LOB.  X 10 Lateral step down 4 inch step WB on Rt leg x 10 with SBA  Neuro Re-ed (balance improvements, muscle activation) Modified tandem stance foot forward with leg elevated with SBA 1 min x 1 bilateral Standing alternating toe tapping 4 inch step x 10 bilateral with SBA  Lateral stepping 3 cones x 6 each way , performed bilaterally with CGA to min A with gait belt.      PATIENT EDUCATION:  08/16/2024 Education details: HEP, POC Person educated: Patient Education method: Programmer, Multimedia, Demonstration, Verbal cues, and Handouts Education comprehension: verbalized understanding, returned demonstration, and verbal cues required  HOME EXERCISE PROGRAM: Access Code: [email or web address] URL: [email or web address] Date: 08/16/2024 Prepared by: Ozell Silvan  Exercises - Supine Bridge  - 1-2 x daily - 7 x weekly - 1-2 sets - 10 reps - 2 hold - Seated Quad Set  - 2-3 x daily - 7 x weekly - 1 sets - 10 reps - 5 hold - Clamshell (Mirrored)  - 1-2 x daily - 7 x weekly - 2-3 sets - 10-15 reps - Seated Isometric Knee Extension  - 2-3 x daily - 7 x weekly - 1 sets - 10 reps - 5-10 hold - Retro Step  - 1-2 x daily - 7 x weekly - 1 sets - 10-20 reps - [place]  - 1-2 x daily - 7 x weekly - 1 sets - 1-2 mins hold  ASSESSMENT:  CLINICAL IMPRESSION: Patient arrived to session noting c-sign type soreness in the Rt hip that feels like a catching, though doesn't necessarily have any pain. Patient is making improvements in strength, ROM, and independence with most activities. Patient will continue to benefit from skilled PT.   OBJECTIVE IMPAIRMENTS: Abnormal gait, decreased activity tolerance, decreased balance, decreased coordination, decreased endurance, decreased mobility, difficulty walking, decreased ROM, decreased strength, increased fascial restrictions, impaired perceived functional ability, increased muscle  spasms, impaired flexibility, improper body mechanics, and pain.   ACTIVITY LIMITATIONS: carrying, lifting, bending, sitting, standing, squatting, sleeping, stairs, transfers, bed mobility, dressing, hygiene/grooming, and locomotion level  PARTICIPATION LIMITATIONS: meal prep, cleaning, laundry, interpersonal relationship, driving, shopping, and community activity  PERSONAL FACTORS: PMH: CVA with Rt hemiparesis, HTN, neurogenic bladder Lt TKA 09/10/2022,  are also affecting patient's functional outcome.   REHAB POTENTIAL: Good  CLINICAL DECISION MAKING:  Stable/uncomplicated  EVALUATION COMPLEXITY: Low   GOALS: Goals reviewed with patient? Yes  SHORT TERM GOALS: (target date for Short term goals are 3 weeks 09/06/2024)   1.  Patient will demonstrate independent use of home exercise program to maintain progress from in clinic treatments.  Goal status: Met  LONG TERM GOALS: (target dates for all long term goals are 10 weeks  10/25/2024 )   1. Patient will demonstrate/report pain at worst less than or equal to 2/10 to facilitate minimal limitation in daily activity secondary to pain symptoms.  Goal status: on going 08/30/2024   2. Patient will demonstrate independent use of home exercise program to facilitate ability to maintain/progress functional gains from skilled physical therapy services.  Goal status: on going 08/30/2024   3. Patient will demonstrate Patient specific functional scale avg > or = 8/10 to indicate reduced disability due to condition.   Goal status: GOAL MET, 09/16/2024   4.  Patient will demonstrate Rt LE MMT 5/5 throughout to faciltiate usual transfers, stairs, squatting at [facility] for daily life.   Goal status: on going 08/30/2024   5.  Patient will demonstrate independent ambulation community distances > 500 ft to facilitate community integration.  Goal status: on going 08/30/2024   6.  Patient will demonstrate TUG < 14 seconds independent to reduce fall  risk.  Goal status: on going 08/30/2024   7.  Patient will demonstrate ascending/descending stairs reciprocally s UE assist for community integration.   Goal Status: on going 08/30/2024   PLAN:  PT FREQUENCY: 1-2x/week  PT DURATION: 10 weeks  PLANNED INTERVENTIONS: Can include 02853- PT Re-evaluation, 97110-Therapeutic exercises, 97530- Therapeutic activity, 97112- Neuromuscular re-education, 97535- Self Care, [id number]- Manual therapy, [phone]- Gait training,[id number]- Electrical stimulation (unattended), 97750 Physical performance testing  Patient/Family education, Balance training, Stair training, Taping, Dry Needling, Joint mobilization, Joint manipulation, Spinal manipulation, Spinal mobilization, Scar mobilization, Vestibular training, Visual/preceptual remediation/compensation, DME instructions, Cryotherapy, and Moist heat.  All performed as medically necessary.  All included unless contraindicated  PLAN FOR NEXT SESSION: generalized strengthening, functional activities, return to gym/independence activities, balance (?)    Susannah Daring, PT, DPT 09/16/24 3:18 PM         Referring diagnosis? S03.358 (ICD-10-CM) - Status post total replacement of right hip Treatment diagnosis? (if different than referring diagnosis) M25.551, M62.81, R 26.2 What was this (referring dx) caused by? [x]  Surgery []  Fall []  Ongoing issue []  Arthritis []  Other: ____________  Laterality: [x]  Rt []  Lt []  Both  Check all possible CPT codes:  *CHOOSE 10 OR LESS*    See Planned Interventions listed in the Plan section of the Evaluation.

## 2024-09-16 ENCOUNTER — Ambulatory Visit

## 2024-09-16 ENCOUNTER — Ambulatory Visit: Admitting: Orthopaedic Surgery

## 2024-09-16 ENCOUNTER — Encounter: Payer: Self-pay | Admitting: Orthopaedic Surgery

## 2024-09-16 DIAGNOSIS — R600 Localized edema: Secondary | ICD-10-CM

## 2024-09-16 DIAGNOSIS — Z96641 Presence of right artificial hip joint: Secondary | ICD-10-CM

## 2024-09-16 DIAGNOSIS — M25551 Pain in right hip: Secondary | ICD-10-CM

## 2024-09-16 DIAGNOSIS — G8929 Other chronic pain: Secondary | ICD-10-CM | POA: Diagnosis not present

## 2024-09-16 DIAGNOSIS — M6281 Muscle weakness (generalized): Secondary | ICD-10-CM

## 2024-09-16 DIAGNOSIS — R262 Difficulty in walking, not elsewhere classified: Secondary | ICD-10-CM

## 2024-09-16 DIAGNOSIS — M25562 Pain in left knee: Secondary | ICD-10-CM | POA: Diagnosis not present

## 2024-09-16 NOTE — Progress Notes (Signed)
 The patient is an active 83 year old female who is now 6 weeks status post a right total hip arthroplasty to treat significant right hip arthritis.  We have replaced her left knee as well.  She has actually been in outpatient physical therapy in order to help with her strengthening as well as balance and coordination.  She is ambulate with a cane.  She reports some hip pain and stiffness but does report that her range of motion and strength are improving.  Her right hip still is stiff with rotation but not severe.  There is still some pain to be expected around the incision and the lateral aspect of the hip.  Overall though she does look improved and better with her mobility.  From our standpoint we will see her back in 3 months.  Will have a standing AP pelvis at that visit.

## 2024-09-20 ENCOUNTER — Encounter: Payer: Self-pay | Admitting: Rehabilitative and Restorative Service Providers"

## 2024-09-20 ENCOUNTER — Ambulatory Visit: Admitting: Rehabilitative and Restorative Service Providers"

## 2024-09-20 ENCOUNTER — Encounter: Payer: Self-pay | Admitting: Radiology

## 2024-09-20 DIAGNOSIS — M6281 Muscle weakness (generalized): Secondary | ICD-10-CM

## 2024-09-20 DIAGNOSIS — M25551 Pain in right hip: Secondary | ICD-10-CM | POA: Diagnosis not present

## 2024-09-20 DIAGNOSIS — R262 Difficulty in walking, not elsewhere classified: Secondary | ICD-10-CM | POA: Diagnosis not present

## 2024-09-20 NOTE — Therapy (Signed)
 OUTPATIENT PHYSICAL THERAPY TREATMENT  Patient Name: Teresa Martinez MRN: [id number] DOB:1941/05/12, 83 y.o., female Today's Date: 09/20/2024   END OF SESSION:  PT End of Session - 09/20/24 1215     Visit Number 11    Number of Visits 20    Date for Recertification  10/25/24    Authorization Type [employer] $20 copay    Authorization Time Period 09/16/2024-10/25/2024    Authorization - Visit Number 2    Authorization - Number of Visits 10    Progress Note Due on Visit 19    PT Start Time 1141    PT Stop Time 1224    PT Time Calculation (min) 43 min    Activity Tolerance Patient tolerated treatment well    Behavior During Therapy WFL for tasks assessed/performed              Past Medical History:  Diagnosis Date   Arthritis    Cervical spine degeneration    C5/C6   Chronic right shoulder pain    GERD (gastroesophageal reflux disease)    Hemiparesis affecting dominant side as late effect of cerebrovascular accident (HCC) 12/07/2014   Hyperlipemia    Hypertension    Menopausal syndrome    [name]   Overactive bladder    Statin intolerance    Stroke (HCC) 2015   weakness on right side   Varicose vein of leg    left leg   Past Surgical History:  Procedure Laterality Date   ABDOMINAL HYSTERECTOMY     1985   BAND HEMORRHOIDECTOMY     2010   CATARACT EXTRACTION, BILATERAL     CHOLECYSTECTOMY     1950   LUMBAR LAMINECTOMY Bilateral 08/03/2018   Dr. Wallene, L3 laminotomy, L4-L5 laminectomy with neural foraminal decompression.   PAROTIDECTOMY     30 years ago   TOTAL HIP ARTHROPLASTY Right 07/30/2024   Procedure: ARTHROPLASTY, HIP, TOTAL, ANTERIOR APPROACH;  Surgeon: Vernetta Lonni GRADE, MD;  Location: [facility];  Service: Orthopedics;  Laterality: Right;   TOTAL KNEE ARTHROPLASTY Left 09/10/2022   Procedure: LEFT TOTAL KNEE ARTHROPLASTY;  Surgeon: Vernetta Lonni GRADE, MD;  Location: [facility];  Service: Orthopedics;  Laterality: Left;    Patient Active Problem List   Diagnosis Date Noted   Status post total replacement of right hip 07/30/2024   Status post left knee replacement 09/10/2022   IGT (impaired glucose tolerance) 07/27/2021   Preop testing 07/09/2018   Degenerative lumbar spinal stenosis 05/12/2018   Statin myopathy 08/29/2016   Iliotibial band syndrome of right side 04/23/2016   Acromioclavicular arthrosis 06/30/2015   Adhesive capsulitis of right shoulder 02/06/2015   Dysarthria due to cerebrovascular accident 01/09/2015   Hemiparesis affecting dominant side as late effect of cerebrovascular accident (HCC) 12/07/2014   Spastic neurogenic bladder 10/31/2014   Right rotator cuff tendonitis 10/19/2014   Stenosis of cervical spine region    Cerebral infarction due to thrombosis of left middle cerebral artery (HCC)    Left pontine stroke (HCC)    TIA (transient ischemic attack) 10/14/2014   Overactive bladder 10/14/2014   H/O: CVA (cerebrovascular accident) 10/14/2014   Essential hypertension 10/10/2009   INSOMNIA 09/15/2009   Pure hypercholesterolemia 11/28/2008   MENOPAUSAL SYNDROME 11/28/2008    PCP: Theophilus Andrews MD  REFERRING PROVIDER: Vernetta Lonni GRADE, MD  REFERRING DIAG: [phone] (ICD-10-CM) - Status post total replacement of right hip  THERAPY DIAG:  Pain in right hip  Muscle weakness (generalized)  Difficulty in walking, not elsewhere  classified  Rationale for Evaluation and Treatment: Rehabilitation  ONSET DATE: 07/30/2024 surgery Rt THA  SUBJECTIVE:   SUBJECTIVE STATEMENT: Pt indicated about the same soreness complaints.  No better, no worse.   PERTINENT HISTORY: PMH: CVA with Rt hemiparesis, HTN, neurogenic bladder.  Lt TKA 09/10/2022   PAIN:  NPRS scale: soreness not pain; not objectively rated   Pain location: anterior, posterior/lateral, even into thigh.  Pain description: soreness Aggravating factors: lying on Rt side, WB pressure.  Relieving factors:  sit/rest. OTC medicine.   PRECAUTIONS: Anterior hip  WEIGHT BEARING RESTRICTIONS: No  FALLS:  Has patient fallen in last 6 months? No  LIVING ENVIRONMENT: Lives with: primary alone but has children staying at this time.  Lives in: House/apartment Stairs:5 to enter at front, 2 in back with handrail on Lt  Has following equipment at home:FWW, SPC  OCCUPATION: Retired  PLOF: Independent, hobbies - read, exercise routine   PATIENT GOALS: walk, drive  OBJECTIVE:   PATIENT SURVEYS:  Patient-Specific Activity Scoring Scheme  0 represents "unable to perform." 10 represents "able to perform at prior level. 0 1 2 3 4 5 6 7 8 9  10 (Date and Score)   Activity Eval  08/16/2024  09/06/2024 09/16/2024   1. Driving  0 8  10  2. Walking  4 8  8   3. Cooking 2 10 10   4. laundry 2 7 10   5. House cleaning 5 5 7   Score 2.6avg 7.6 9   Total score = sum of the activity scores/number of activities Minimum detectable change (90%CI) for average score = 2 points Minimum detectable change (90%CI) for single activity score = 3 points  COGNITION: 08/16/2024 Overall cognitive status: WFL    SENSATION: 08/16/2024 No impairment observed in dermatomes.   MUSCLE LENGTH: 08/16/2024 No specific testing.   POSTURE:  08/16/2024 Mild weight shift off Rt leg to Lt in standing.   PALPATION: 08/16/2024 Tenderness to light touch, trigger points/muscle tenderness in Rt glute max, med, min, anterior/lateral quad Rt.   LOWER EXTREMITY ROM:  08/16/2024: Held formalized ROM testing today   ROM Right Eval 08/16/2024 Left Eval 08/16/2024  Hip flexion    Hip extension    Hip abduction    Hip adduction    Hip internal rotation    Hip external rotation    Knee flexion    Knee extension    Ankle dorsiflexion    Ankle plantarflexion    Ankle inversion    Ankle eversion     (Blank rows = not tested)  LOWER EXTREMITY MMT:  MMT Right Eval 08/16/2024 Left Eval 08/16/2024 Right 08/30/2024   Hip flexion 4/5 5/5 5/5  Hip extension     Hip abduction 3+/5    Hip adduction     Hip internal rotation     Hip external rotation     Knee flexion 5/5 5/5 5/5  Knee extension 4/5 5/5 5/5  Ankle dorsiflexion 4+/5 5/5 5/5  Ankle plantarflexion     Ankle inversion     Ankle eversion      (Blank rows = not tested)  LOWER EXTREMITY SPECIAL TESTS:  08/16/2024 No   FUNCTIONAL TESTS:   09/13/2024: TUG with SPC: 14.2 seconds TUG independently : 12.20 seconds 18 inch chair transfer : 1st try without UE assist.   08/25/2024:  TUG with SPC : 20 seconds.   08/16/2024 18 inch chair transfer: unable s UE assist  Lt SLS: not tested  Rt SLS: unable  TUG with  FWW;  21.62 seconds  09/16/2024 6 minute walk test: no AD for 836'3 5x STS: 12.57s no UE use from 18 arm chair   GAIT: 09/06/2024: Ambulation with SPC to clinic.  Able to do short distances in clinic independent.   08/16/2024 Household distances in clinic with FWW with step through gait pattern.                                                                                                                                                                         TODAY'S TREATMENT     DATE: 11/3/025 Therex: Nustep Lvl 6 10 mins for ROM, aerobic exercise Supine bridge with green band around knees for hip abduction hold 2 x 10 Supine guided Thomas stretch to neutral Rt leg with Lt knee to chest 30 sec x 3    Neuro Re-ed SLS with contralateral leg tapping 3 anteriorly placed cones in // bars with occasional HHA, SBA x 6 each cone, performed bilaterally  Fitter rocker board fwd/back light slow touching in // bars with moderate HHA on bar x 25 each way   TherActivity Leg press double leg 100 lbs x 20  Leg press single leg , performed bilaterally 56 lbs 2 x 15  Step up forward with slow lowering reverse step down 6 inch step in // bars with Lt hand assist on bar     TODAY'S TREATMENT     DATE:  09/16/2024 Physical  Performance: Patient completed 6-minute walk test with no AD with results noted above and discussed with patient  Patient completed 5xSTS with no UE use from 18 inch chair with results noted above and discussed with patient   TherEx:  Nustep level 6 for 5 minutes with bilat UE and LE  Bilat leg press 2x15 with 100#  Unilat leg press 2x15 with 56#  Knee extension machine with bilat up and unilat down 3x6 with each leg  Supine SLR with 3# ankle weight 2x12 each side    TODAY'S TREATMENT     DATE:  09/13/2024 Therex: Nustep lvl 6 UE/LE for ROM, endurance - 10 mins  Supine bridge 2-3 sec hold 2 x 10  Supine hooklying clam shell green band 2 x 20 bilateral with isometric leg holding steady.  Supine SKC 30 sec x 3 bilaterally   Neuro Re-ed Forward stepping weight shift x 15 bilateral with mirror feedback Retro stepping weight shift x 15 bilateral in // bars with occasional HHA, mirror feedback Fwd/back ambulation 10 ft in // bars with focus on equal step lengths bilateral. 10 ft x 5 each way with supervision. Banker.   TherActivity Flight of stairs up/down with reciprocal gait pattern with supervision and bilateral hand rail assist.  Performed  going down full flight with reciprocal gait pattern and Lt hand on rail only    TODAY'S TREATMENT                                                         DATE:  09/08/2024 Therex: Nustep lvl 5 UE/LE for ROM, endurance - 10 mins  Incline gastroc stretch 30 sec x 3 bilateral      TherActivity: (to improve ambulation, stairs, squatting, transfers) Leg press double leg 100 lbs 2 x 15 slow lowering focus Leg press single leg 56 lbs 2 x 15 bilateral Squat to 8 inch box with 5 lb kettle bell x 10 with cues for positioning.    Neuro Re-ed 6 inch hurdle step to pattern in // bars with occasional HHA with SBA.  10 ft x 4 leading with each LE first.  Lateral stepping over 6 inch hurdle in // bars with occasional to moderate HHA 10 ft x 3 with  SBA    Manual: Percussive device to Rt quad middle and distally, lateral thigh for trigger points and soft tissue mobility.  Tennis ball use for same goal.  Education for home use with tennis ball provided.    TODAY'S TREATMENT     DATE:  09/06/2024 Therex: Nustep lvl 6 UE/LE for ROM, endurance - 10 mins  Seated Rt leg LAQ 4 lbs 3 x 8   TherActivity: (to improve ambulation, stairs, squatting, transfers) Leg press double leg 100 lbs 2 x 15 slow lowering focus Leg press single leg 56 lbs 2 x 15 bilateral Forward step up WB on Rt leg 4 inch step with SBA to min A at times for LOB.  X 10 Lateral step down 4 inch step WB on Rt leg x 10 with SBA  Neuro Re-ed (balance improvements, muscle activation) Modified tandem stance foot forward with leg elevated with SBA 1 min x 1 bilateral Standing alternating toe tapping 4 inch step x 10 bilateral with SBA  Lateral stepping 3 cones x 6 each way , performed bilaterally with CGA to min A with gait belt.      PATIENT EDUCATION:  08/16/2024 Education details: HEP, POC Person educated: Patient Education method: Programmer, Multimedia, Demonstration, Verbal cues, and Handouts Education comprehension: verbalized understanding, returned demonstration, and verbal cues required  HOME EXERCISE PROGRAM: Access Code: [email or web address] URL: [email or web address] Date: 08/16/2024 Prepared by: Ozell Silvan  Exercises - Supine Bridge  - 1-2 x daily - 7 x weekly - 1-2 sets - 10 reps - 2 hold - Seated Quad Set  - 2-3 x daily - 7 x weekly - 1 sets - 10 reps - 5 hold - Clamshell (Mirrored)  - 1-2 x daily - 7 x weekly - 2-3 sets - 10-15 reps - Seated Isometric Knee Extension  - 2-3 x daily - 7 x weekly - 1 sets - 10 reps - 5-10 hold - Retro Step  - 1-2 x daily - 7 x weekly - 1 sets - 10-20 reps - [place]  - 1-2 x daily - 7 x weekly - 1 sets - 1-2 mins hold  ASSESSMENT:  CLINICAL IMPRESSION: Continued work on strengthening and balance control indicated at  this time to improve stability in ambulation and functional tasks like stairs.   OBJECTIVE IMPAIRMENTS: Abnormal gait, decreased activity tolerance, decreased balance,  decreased coordination, decreased endurance, decreased mobility, difficulty walking, decreased ROM, decreased strength, increased fascial restrictions, impaired perceived functional ability, increased muscle spasms, impaired flexibility, improper body mechanics, and pain.   ACTIVITY LIMITATIONS: carrying, lifting, bending, sitting, standing, squatting, sleeping, stairs, transfers, bed mobility, dressing, hygiene/grooming, and locomotion level  PARTICIPATION LIMITATIONS: meal prep, cleaning, laundry, interpersonal relationship, driving, shopping, and community activity  PERSONAL FACTORS: PMH: CVA with Rt hemiparesis, HTN, neurogenic bladder Lt TKA 09/10/2022,  are also affecting patient's functional outcome.   REHAB POTENTIAL: Good  CLINICAL DECISION MAKING: Stable/uncomplicated  EVALUATION COMPLEXITY: Low   GOALS: Goals reviewed with patient? Yes  SHORT TERM GOALS: (target date for Short term goals are 3 weeks 09/06/2024)   1.  Patient will demonstrate independent use of home exercise program to maintain progress from in clinic treatments.  Goal status: Met  LONG TERM GOALS: (target dates for all long term goals are 10 weeks  10/25/2024 )   1. Patient will demonstrate/report pain at worst less than or equal to 2/10 to facilitate minimal limitation in daily activity secondary to pain symptoms.  Goal status: on going 08/30/2024   2. Patient will demonstrate independent use of home exercise program to facilitate ability to maintain/progress functional gains from skilled physical therapy services.  Goal status: on going 08/30/2024   3. Patient will demonstrate Patient specific functional scale avg > or = 8/10 to indicate reduced disability due to condition.   Goal status: GOAL MET, 09/16/2024   4.  Patient will  demonstrate Rt LE MMT 5/5 throughout to faciltiate usual transfers, stairs, squatting at [facility] for daily life.   Goal status: on going 08/30/2024   5.  Patient will demonstrate independent ambulation community distances > 500 ft to facilitate community integration.  Goal status: on going 08/30/2024   6.  Patient will demonstrate TUG < 14 seconds independent to reduce fall risk.  Goal status: on going 08/30/2024   7.  Patient will demonstrate ascending/descending stairs reciprocally s UE assist for community integration.   Goal Status: on going 08/30/2024   PLAN:  PT FREQUENCY: 1-2x/week  PT DURATION: 10 weeks  PLANNED INTERVENTIONS: Can include 02853- PT Re-evaluation, 97110-Therapeutic exercises, 97530- Therapeutic activity, 97112- Neuromuscular re-education, 97535- Self Care, [id number]- Manual therapy, [phone]- Gait training,[id number]- Electrical stimulation (unattended), [id number] Physical performance testing  Patient/Family education, Balance training, Stair training, Taping, Dry Needling, Joint mobilization, Joint manipulation, Spinal manipulation, Spinal mobilization, Scar mobilization, Vestibular training, Visual/preceptual remediation/compensation, DME instructions, Cryotherapy, and Moist heat.  All performed as medically necessary.  All included unless contraindicated  PLAN FOR NEXT SESSION:  balance improvements.    Ozell Silvan, PT, DPT, OCS, ATC 09/20/24  12:29 PM         Referring diagnosis? S03.358 (ICD-10-CM) - Status post total replacement of right hip Treatment diagnosis? (if different than referring diagnosis) M25.551, M62.81, R 26.2 What was this (referring dx) caused by? [x]  Surgery []  Fall []  Ongoing issue []  Arthritis []  Other: ____________  Laterality: [x]  Rt []  Lt []  Both  Check all possible CPT codes:  *CHOOSE 10 OR LESS*    See Planned Interventions listed in the Plan section of the Evaluation.

## 2024-09-22 ENCOUNTER — Ambulatory Visit (INDEPENDENT_AMBULATORY_CARE_PROVIDER_SITE_OTHER): Admitting: Rehabilitative and Restorative Service Providers"

## 2024-09-22 ENCOUNTER — Encounter: Payer: Self-pay | Admitting: Rehabilitative and Restorative Service Providers"

## 2024-09-22 DIAGNOSIS — M25551 Pain in right hip: Secondary | ICD-10-CM

## 2024-09-22 DIAGNOSIS — M6281 Muscle weakness (generalized): Secondary | ICD-10-CM | POA: Diagnosis not present

## 2024-09-22 DIAGNOSIS — R262 Difficulty in walking, not elsewhere classified: Secondary | ICD-10-CM

## 2024-09-22 NOTE — Therapy (Signed)
 OUTPATIENT PHYSICAL THERAPY TREATMENT  Patient Name: Teresa Martinez MRN: [id number] DOB:October 24, 1941, 83 y.o., female Today's Date: 09/22/2024   END OF SESSION:  PT End of Session - 09/22/24 1138     Visit Number 12    Number of Visits 20    Date for Recertification  10/25/24    Authorization Type [employer] $20 copay    Authorization Time Period 09/16/2024-10/25/2024    Authorization - Visit Number 3    Authorization - Number of Visits 10    Progress Note Due on Visit 19    PT Start Time 1140    PT Stop Time 1219    PT Time Calculation (min) 39 min    Activity Tolerance Patient tolerated treatment well    Behavior During Therapy WFL for tasks assessed/performed            Past Medical History:  Diagnosis Date   Arthritis    Cervical spine degeneration    C5/C6   Chronic right shoulder pain    GERD (gastroesophageal reflux disease)    Hemiparesis affecting dominant side as late effect of cerebrovascular accident (HCC) 12/07/2014   Hyperlipemia    Hypertension    Menopausal syndrome    [name]   Overactive bladder    Statin intolerance    Stroke (HCC) 2015   weakness on right side   Varicose vein of leg    left leg   Past Surgical History:  Procedure Laterality Date   ABDOMINAL HYSTERECTOMY     1985   BAND HEMORRHOIDECTOMY     2010   CATARACT EXTRACTION, BILATERAL     CHOLECYSTECTOMY     1950   LUMBAR LAMINECTOMY Bilateral 08/03/2018   Dr. Wallene, L3 laminotomy, L4-L5 laminectomy with neural foraminal decompression.   PAROTIDECTOMY     30 years ago   TOTAL HIP ARTHROPLASTY Right 07/30/2024   Procedure: ARTHROPLASTY, HIP, TOTAL, ANTERIOR APPROACH;  Surgeon: Vernetta Lonni GRADE, MD;  Location: [facility];  Service: Orthopedics;  Laterality: Right;   TOTAL KNEE ARTHROPLASTY Left 09/10/2022   Procedure: LEFT TOTAL KNEE ARTHROPLASTY;  Surgeon: Vernetta Lonni GRADE, MD;  Location: [facility];  Service: Orthopedics;  Laterality: Left;   Patient  Active Problem List   Diagnosis Date Noted   Status post total replacement of right hip 07/30/2024   Status post left knee replacement 09/10/2022   IGT (impaired glucose tolerance) 07/27/2021   Preop testing 07/09/2018   Degenerative lumbar spinal stenosis 05/12/2018   Statin myopathy 08/29/2016   Iliotibial band syndrome of right side 04/23/2016   Acromioclavicular arthrosis 06/30/2015   Adhesive capsulitis of right shoulder 02/06/2015   Dysarthria due to cerebrovascular accident 01/09/2015   Hemiparesis affecting dominant side as late effect of cerebrovascular accident (HCC) 12/07/2014   Spastic neurogenic bladder 10/31/2014   Right rotator cuff tendonitis 10/19/2014   Stenosis of cervical spine region    Cerebral infarction due to thrombosis of left middle cerebral artery (HCC)    Left pontine stroke (HCC)    TIA (transient ischemic attack) 10/14/2014   Overactive bladder 10/14/2014   H/O: CVA (cerebrovascular accident) 10/14/2014   Essential hypertension 10/10/2009   INSOMNIA 09/15/2009   Pure hypercholesterolemia 11/28/2008   MENOPAUSAL SYNDROME 11/28/2008    PCP: Theophilus Andrews MD  REFERRING PROVIDER: Vernetta Lonni GRADE, MD  REFERRING DIAG: [phone] (ICD-10-CM) - Status post total replacement of right hip  THERAPY DIAG:  Pain in right hip  Muscle weakness (generalized)  Difficulty in walking, not elsewhere classified  Rationale for Evaluation and Treatment: Rehabilitation  ONSET DATE: 07/30/2024 surgery Rt THA  SUBJECTIVE:   SUBJECTIVE STATEMENT: Pt indicated feeling good today.  Reported still some soreness.  Pt indicated using cane very seldom at home.   PERTINENT HISTORY: PMH: CVA with Rt hemiparesis, HTN, neurogenic bladder.  Lt TKA 09/10/2022   PAIN:  NPRS scale: no specific pain upon arrival.  Pain location: anterior, posterior/lateral, even into thigh.  Pain description: soreness Aggravating factors: lying on Rt side, WB pressure.   Relieving factors: sit/rest. OTC medicine.   PRECAUTIONS: Anterior hip  WEIGHT BEARING RESTRICTIONS: No  FALLS:  Has patient fallen in last 6 months? No  LIVING ENVIRONMENT: Lives with: primary alone but has children staying at this time.  Lives in: House/apartment Stairs:5 to enter at front, 2 in back with handrail on Lt  Has following equipment at home:FWW, SPC  OCCUPATION: Retired  PLOF: Independent, hobbies - read, exercise routine   PATIENT GOALS: walk, drive  OBJECTIVE:   PATIENT SURVEYS:  Patient-Specific Activity Scoring Scheme  0 represents "unable to perform." 10 represents "able to perform at prior level. 0 1 2 3 4 5 6 7 8 9  10 (Date and Score)   Activity Eval  08/16/2024  09/06/2024 09/16/2024   1. Driving  0 8  10  2. Walking  4 8  8   3. Cooking 2 10 10   4. laundry 2 7 10   5. House cleaning 5 5 7   Score 2.6avg 7.6 9   Total score = sum of the activity scores/number of activities Minimum detectable change (90%CI) for average score = 2 points Minimum detectable change (90%CI) for single activity score = 3 points  COGNITION: 08/16/2024 Overall cognitive status: WFL    SENSATION: 08/16/2024 No impairment observed in dermatomes.   MUSCLE LENGTH: 08/16/2024 No specific testing.   POSTURE:  08/16/2024 Mild weight shift off Rt leg to Lt in standing.   PALPATION: 08/16/2024 Tenderness to light touch, trigger points/muscle tenderness in Rt glute max, med, min, anterior/lateral quad Rt.   LOWER EXTREMITY ROM:  08/16/2024: Held formalized ROM testing today   ROM Right Eval 08/16/2024 Left Eval 08/16/2024  Hip flexion    Hip extension    Hip abduction    Hip adduction    Hip internal rotation    Hip external rotation    Knee flexion    Knee extension    Ankle dorsiflexion    Ankle plantarflexion    Ankle inversion    Ankle eversion     (Blank rows = not tested)  LOWER EXTREMITY MMT:  MMT Right Eval 08/16/2024 Left Eval 08/16/2024  Right 08/30/2024  Hip flexion 4/5 5/5 5/5  Hip extension     Hip abduction 3+/5    Hip adduction     Hip internal rotation     Hip external rotation     Knee flexion 5/5 5/5 5/5  Knee extension 4/5 5/5 5/5  Ankle dorsiflexion 4+/5 5/5 5/5  Ankle plantarflexion     Ankle inversion     Ankle eversion      (Blank rows = not tested)  LOWER EXTREMITY SPECIAL TESTS:  08/16/2024 No   FUNCTIONAL TESTS:   09/16/2024 6 minute walk test: no AD for 836'3 5x STS: 12.57s no UE use from 18 arm chair   09/13/2024: TUG with SPC: 14.2 seconds TUG independently : 12.20 seconds 18 inch chair transfer : 1st try without UE assist.   08/25/2024:  TUG with SPC : 20  seconds.   08/16/2024 18 inch chair transfer: unable s UE assist  Lt SLS: not tested  Rt SLS: unable  TUG with FWW;  21.62 seconds  GAIT: 09/06/2024: Ambulation with SPC to clinic.  Able to do short distances in clinic independent.   08/16/2024 Household distances in clinic with FWW with step through gait pattern.                                                                                                                                                                         TODAY'S TREATMENT     DATE: 11/3/025 Therex: Nustep Lvl 6 10 mins for ROM, aerobic exercise   Neuro Re-ed (balance improvements, coordination) Tandem stance 1 min x 2 bilateral on foam in // bars with occasional HHA on bars.  Feet together stance across foam bar in // bars eyes open 1 min, head turns x 20 each way with min A at times to prevent LOB.  Step to pattern over 6 inch hurdle in // bars 10 ft x 4 leading with each LE, lateral stepping 10 ft x 3 each way.  SBA.   TherActivity Step up forward 6 inch step with Lt hand lightly on bar x 10 bilateral LE Flight of stairs reciprocal gait going down full fight with supervision.    TODAY'S TREATMENT     DATE: 11/3/025 Therex: Nustep Lvl 6 10 mins for ROM, aerobic exercise Supine bridge with  green band around knees for hip abduction hold 2 x 10 Supine guided Thomas stretch to neutral Rt leg with Lt knee to chest 30 sec x 3    Neuro Re-ed SLS with contralateral leg tapping 3 anteriorly placed cones in // bars with occasional HHA, SBA x 6 each cone, performed bilaterally  Fitter rocker board fwd/back light slow touching in // bars with moderate HHA on bar x 25 each way   TherActivity Leg press double leg 100 lbs x 20  Leg press single leg , performed bilaterally 56 lbs 2 x 15  Step up forward with slow lowering reverse step down 6 inch step in // bars with Lt hand assist on bar     TODAY'S TREATMENT     DATE:  09/16/2024 Physical Performance: Patient completed 6-minute walk test with no AD with results noted above and discussed with patient  Patient completed 5xSTS with no UE use from 18 inch chair with results noted above and discussed with patient   TherEx:  Nustep level 6 for 5 minutes with bilat UE and LE  Bilat leg press 2x15 with 100#  Unilat leg press 2x15 with 56#  Knee extension machine with bilat up and unilat down 3x6 with each leg  Supine SLR with  3# ankle weight 2x12 each side    TODAY'S TREATMENT     DATE:  09/13/2024 Therex: Nustep lvl 6 UE/LE for ROM, endurance - 10 mins  Supine bridge 2-3 sec hold 2 x 10  Supine hooklying clam shell green band 2 x 20 bilateral with isometric leg holding steady.  Supine SKC 30 sec x 3 bilaterally   Neuro Re-ed Forward stepping weight shift x 15 bilateral with mirror feedback Retro stepping weight shift x 15 bilateral in // bars with occasional HHA, mirror feedback Fwd/back ambulation 10 ft in // bars with focus on equal step lengths bilateral. 10 ft x 5 each way with supervision. Banker.   TherActivity Flight of stairs up/down with reciprocal gait pattern with supervision and bilateral hand rail assist.  Performed going down full flight with reciprocal gait pattern and Lt hand on rail only    TODAY'S  TREATMENT                                                         DATE:  09/08/2024 Therex: Nustep lvl 5 UE/LE for ROM, endurance - 10 mins  Incline gastroc stretch 30 sec x 3 bilateral      TherActivity: (to improve ambulation, stairs, squatting, transfers) Leg press double leg 100 lbs 2 x 15 slow lowering focus Leg press single leg 56 lbs 2 x 15 bilateral Squat to 8 inch box with 5 lb kettle bell x 10 with cues for positioning.    Neuro Re-ed 6 inch hurdle step to pattern in // bars with occasional HHA with SBA.  10 ft x 4 leading with each LE first.  Lateral stepping over 6 inch hurdle in // bars with occasional to moderate HHA 10 ft x 3 with SBA    Manual: Percussive device to Rt quad middle and distally, lateral thigh for trigger points and soft tissue mobility.  Tennis ball use for same goal.  Education for home use with tennis ball provided.      PATIENT EDUCATION:  08/16/2024 Education details: HEP, POC Person educated: Patient Education method: Programmer, Multimedia, Demonstration, Verbal cues, and Handouts Education comprehension: verbalized understanding, returned demonstration, and verbal cues required  HOME EXERCISE PROGRAM: Access Code: [email or web address] URL: [email or web address] Date: 08/16/2024 Prepared by: Ozell Silvan  Exercises - Supine Bridge  - 1-2 x daily - 7 x weekly - 1-2 sets - 10 reps - 2 hold - Seated Quad Set  - 2-3 x daily - 7 x weekly - 1 sets - 10 reps - 5 hold - Clamshell (Mirrored)  - 1-2 x daily - 7 x weekly - 2-3 sets - 10-15 reps - Seated Isometric Knee Extension  - 2-3 x daily - 7 x weekly - 1 sets - 10 reps - 5-10 hold - Retro Step  - 1-2 x daily - 7 x weekly - 1 sets - 10-20 reps - [place]  - 1-2 x daily - 7 x weekly - 1 sets - 1-2 mins hold  ASSESSMENT:  CLINICAL IMPRESSION: Continued focus on inclusion of compliant and non compliant surface challenges for balance to improve stability.   OBJECTIVE IMPAIRMENTS: Abnormal gait,  decreased activity tolerance, decreased balance, decreased coordination, decreased endurance, decreased mobility, difficulty walking, decreased ROM, decreased strength, increased fascial restrictions, impaired perceived functional ability, increased muscle spasms, impaired flexibility,  improper body mechanics, and pain.   ACTIVITY LIMITATIONS: carrying, lifting, bending, sitting, standing, squatting, sleeping, stairs, transfers, bed mobility, dressing, hygiene/grooming, and locomotion level  PARTICIPATION LIMITATIONS: meal prep, cleaning, laundry, interpersonal relationship, driving, shopping, and community activity  PERSONAL FACTORS: PMH: CVA with Rt hemiparesis, HTN, neurogenic bladder Lt TKA 09/10/2022,  are also affecting patient's functional outcome.   REHAB POTENTIAL: Good  CLINICAL DECISION MAKING: Stable/uncomplicated  EVALUATION COMPLEXITY: Low   GOALS: Goals reviewed with patient? Yes  SHORT TERM GOALS: (target date for Short term goals are 3 weeks 09/06/2024)   1.  Patient will demonstrate independent use of home exercise program to maintain progress from in clinic treatments.  Goal status: Met  LONG TERM GOALS: (target dates for all long term goals are 10 weeks  10/25/2024 )   1. Patient will demonstrate/report pain at worst less than or equal to 2/10 to facilitate minimal limitation in daily activity secondary to pain symptoms.  Goal status: on going 08/30/2024   2. Patient will demonstrate independent use of home exercise program to facilitate ability to maintain/progress functional gains from skilled physical therapy services.  Goal status: on going 08/30/2024   3. Patient will demonstrate Patient specific functional scale avg > or = 8/10 to indicate reduced disability due to condition.   Goal status: GOAL MET, 09/16/2024   4.  Patient will demonstrate Rt LE MMT 5/5 throughout to faciltiate usual transfers, stairs, squatting at [facility] for daily life.   Goal status:  on going 08/30/2024   5.  Patient will demonstrate independent ambulation community distances > 500 ft to facilitate community integration.  Goal status: on going 08/30/2024   6.  Patient will demonstrate TUG < 14 seconds independent to reduce fall risk.  Goal status: on going 08/30/2024   7.  Patient will demonstrate ascending/descending stairs reciprocally s UE assist for community integration.   Goal Status: on going 08/30/2024   PLAN:  PT FREQUENCY: 1-2x/week  PT DURATION: 10 weeks  PLANNED INTERVENTIONS: Can include 02853- PT Re-evaluation, 97110-Therapeutic exercises, 97530- Therapeutic activity, 97112- Neuromuscular re-education, 97535- Self Care, [id number]- Manual therapy, [id number]- Gait training,[id number]- Electrical stimulation (unattended), 97750 Physical performance testing  Patient/Family education, Balance training, Stair training, Taping, Dry Needling, Joint mobilization, Joint manipulation, Spinal manipulation, Spinal mobilization, Scar mobilization, Vestibular training, Visual/preceptual remediation/compensation, DME instructions, Cryotherapy, and Moist heat.  All performed as medically necessary.  All included unless contraindicated  PLAN FOR NEXT SESSION:  balance improvements, strength.    Ozell Silvan, PT, DPT, OCS, ATC 09/22/24  12:22 PM         Referring diagnosis? S03.358 (ICD-10-CM) - Status post total replacement of right hip Treatment diagnosis? (if different than referring diagnosis) M25.551, M62.81, R 26.2 What was this (referring dx) caused by? [x]  Surgery []  Fall []  Ongoing issue []  Arthritis []  Other: ____________  Laterality: [x]  Rt []  Lt []  Both  Check all possible CPT codes:  *CHOOSE 10 OR LESS*    See Planned Interventions listed in the Plan section of the Evaluation.

## 2024-09-23 ENCOUNTER — Other Ambulatory Visit: Payer: Self-pay | Admitting: *Deleted

## 2024-09-23 ENCOUNTER — Other Ambulatory Visit: Payer: Self-pay

## 2024-09-23 DIAGNOSIS — E7841 Elevated Lipoprotein(a): Secondary | ICD-10-CM | POA: Diagnosis not present

## 2024-09-23 DIAGNOSIS — E7849 Other hyperlipidemia: Secondary | ICD-10-CM

## 2024-09-23 DIAGNOSIS — Z79899 Other long term (current) drug therapy: Secondary | ICD-10-CM | POA: Diagnosis not present

## 2024-09-24 LAB — NMR, LIPOPROFILE
Cholesterol, Total: 112 mg/dL (ref 100–199)
HDL Particle Number: 37.6 umol/L (ref >=30.5)
HDL-C: 46 mg/dL (ref >39)
LDL Particle Number: 715 nmol/L (ref <1000)
LDL Size: 19.7 nm — ABNORMAL LOW (ref >20.5)
LDL-C (NIH Calc): 48 mg/dL (ref 0–99)
LP-IR Score: 66 — ABNORMAL HIGH (ref <=45)
Small LDL Particle Number: 428 nmol/L (ref <=527)
Triglycerides: 97 mg/dL (ref 0–149)

## 2024-09-28 ENCOUNTER — Ambulatory Visit (INDEPENDENT_AMBULATORY_CARE_PROVIDER_SITE_OTHER): Admitting: Internal Medicine

## 2024-09-28 ENCOUNTER — Encounter: Payer: Self-pay | Admitting: Internal Medicine

## 2024-09-28 VITALS — BP 166/97 | Temp 97.5°F | Ht 65.0 in | Wt 137.9 lb

## 2024-09-28 DIAGNOSIS — K219 Gastro-esophageal reflux disease without esophagitis: Secondary | ICD-10-CM | POA: Diagnosis not present

## 2024-09-28 DIAGNOSIS — Z23 Encounter for immunization: Secondary | ICD-10-CM | POA: Diagnosis not present

## 2024-09-28 DIAGNOSIS — I10 Essential (primary) hypertension: Secondary | ICD-10-CM | POA: Diagnosis not present

## 2024-09-28 DIAGNOSIS — Z0000 Encounter for general adult medical examination without abnormal findings: Secondary | ICD-10-CM

## 2024-09-28 DIAGNOSIS — E7800 Pure hypercholesterolemia, unspecified: Secondary | ICD-10-CM

## 2024-09-28 DIAGNOSIS — R7302 Impaired glucose tolerance (oral): Secondary | ICD-10-CM | POA: Diagnosis not present

## 2024-09-28 LAB — POCT GLYCOSYLATED HEMOGLOBIN (HGB A1C): Hemoglobin A1C: 5.8 % — AB (ref 4.0–5.6)

## 2024-09-28 MED ORDER — PANTOPRAZOLE SODIUM 40 MG PO TBEC: 40.0000 mg | DELAYED_RELEASE_TABLET | Freq: Every day | ORAL | 1 refills | Status: AC

## 2024-09-28 NOTE — Addendum Note (Signed)
 Addended by: KATHRYNE MILLMAN B on: 09/28/2024 02:50 PM   Modules accepted: Orders

## 2024-09-28 NOTE — Progress Notes (Signed)
 Established Patient Office Visit     CC/Reason for Visit: Annual preventive exam and subsequent Medicare wellness visit  HPI: Teresa Martinez is a 83 y.o. female who is coming in today for the above mentioned reasons. Past Medical History is significant for: Hypertension, hyperlipidemia, impaired glucose tolerance.  She had her hip replacement 2 months ago and is doing well.  She has been dealing with some pain issues and believes that her blood pressure has been somewhat elevated subsequent to this.  Has been compliant with all medication.  Has routine eye and dental care.  Is due for flu vaccine.  Cancer screening is up-to-date.  She elects to defer cervical and colon cancer screening at this venue.   Past Medical/Surgical History: Past Medical History:  Diagnosis Date   Arthritis    Cervical spine degeneration    C5/C6   Chronic right shoulder pain    GERD (gastroesophageal reflux disease)    Hemiparesis affecting dominant side as late effect of cerebrovascular accident (HCC) 12/07/2014   Hyperlipemia    Hypertension    Menopausal syndrome    [name]   Overactive bladder    Statin intolerance    Stroke (HCC) 2015   weakness on right side   Varicose vein of leg    left leg    Past Surgical History:  Procedure Laterality Date   ABDOMINAL HYSTERECTOMY     1985   BAND HEMORRHOIDECTOMY     2010   CATARACT EXTRACTION, BILATERAL     CHOLECYSTECTOMY     1950   LUMBAR LAMINECTOMY Bilateral 08/03/2018   Dr. Wallene, L3 laminotomy, L4-L5 laminectomy with neural foraminal decompression.   PAROTIDECTOMY     30 years ago   TOTAL HIP ARTHROPLASTY Right 07/30/2024   Procedure: ARTHROPLASTY, HIP, TOTAL, ANTERIOR APPROACH;  Surgeon: Vernetta Lonni GRADE, MD;  Location: [facility];  Service: Orthopedics;  Laterality: Right;   TOTAL KNEE ARTHROPLASTY Left 09/10/2022   Procedure: LEFT TOTAL KNEE ARTHROPLASTY;  Surgeon: Vernetta Lonni GRADE, MD;  Location: [facility];  Service: Orthopedics;  Laterality: Left;    Social History:  reports that she has never smoked. She has never used smokeless tobacco. She reports that she does not drink alcohol  and does not use drugs.  Allergies: Allergies  Allergen Reactions   Oxycodone  Nausea And Vomiting   Statins Other (See Comments)    Muscle pain and unable to walk Muscle pain and unable to walk   Wasp Venom    Latex Other (See Comments)    Burns her skin    Family History:  Family History  Problem Relation Age of Onset   Heart failure Father    Diabetes Mother    Hypertension Mother    Stroke Mother    Cirrhosis Sister    Diabetes Sister    Hyperlipidemia Brother    Hypertension Brother    Stroke Brother    Diabetes Brother      Current Outpatient Medications:    aspirin  EC 325 MG tablet, Take 325 mg by mouth at bedtime., Disp: , Rfl:    Bempedoic Acid-Ezetimibe  (NEXLIZET ) 180-10 MG TABS, Take 1 tablet by mouth daily. (Patient taking differently: Take 1 tablet by mouth at bedtime.), Disp: 90 tablet, Rfl: 2   Calcium  Carbonate-Vit D-Min (CALTRATE PLUS PO), Take 1 tablet by mouth in the morning., Disp: , Rfl:    carboxymethylcellulose (REFRESH PLUS) 0.5 % SOLN, Place 1-2 drops into both eyes 3 (three) times daily as  needed (dry/irritated eyes.)., Disp: , Rfl:    Evolocumab  (REPATHA  SURECLICK) 140 MG/ML SOAJ, Inject 140 mg into the skin every 14 (fourteen) days., Disp: 6 mL, Rfl: 3   fesoterodine  (TOVIAZ ) 4 MG TB24 tablet, TAKE 1 TABLET(4 MG) BY MOUTH DAILY, Disp: 90 tablet, Rfl: 1   Magnesium  250 MG TABS, Take 250 mg by mouth in the morning., Disp: , Rfl:    methocarbamol  (ROBAXIN ) 500 MG tablet, Take 1 tablet (500 mg total) by mouth every 6 (six) hours as needed for muscle spasms., Disp: 30 tablet, Rfl: 0   Multiple Vitamin (MULTIVITAMIN WITH MINERALS) TABS tablet, Take 1 tablet by mouth daily. Centrum, Disp: , Rfl:    Omega-3 Fatty Acids (FISH OIL ULTRA) 1400 MG CAPS, Take 1,400 mg by mouth in  the morning., Disp: , Rfl:    pantoprazole  (PROTONIX ) 40 MG tablet, Take 1 tablet (40 mg total) by mouth daily., Disp: 90 tablet, Rfl: 1   traMADol  (ULTRAM ) 50 MG tablet, Take 1-2 tablets (50-100 mg total) by mouth every 6 (six) hours as needed for severe pain (pain score 7-10)., Disp: 30 tablet, Rfl: 0   valsartan  (DIOVAN ) 160 MG tablet, TAKE 1 TABLET(160 MG) BY MOUTH DAILY, Disp: 90 tablet, Rfl: 0  Review of Systems:  Negative unless indicated in HPI.   Physical Exam: Vitals:   09/28/24 1035 09/28/24 1038  BP: (!) 150/98 (!) 166/97  Temp: (!) 97.5 F (36.4 C)   TempSrc: Oral   Weight: 137 lb 14.4 oz (62.6 kg)   Height: 5' 5 (1.651 m)     Body mass index is 22.95 kg/m.   Physical Exam Vitals reviewed.  Constitutional:      General: She is not in acute distress.    Appearance: Normal appearance. She is not ill-appearing, toxic-appearing or diaphoretic.  HENT:     Head: Normocephalic.     Right Ear: Tympanic membrane, ear canal and external ear normal. There is no impacted cerumen.     Left Ear: Tympanic membrane, ear canal and external ear normal. There is no impacted cerumen.     Nose: Nose normal.     Mouth/Throat:     Mouth: Mucous membranes are moist.     Pharynx: Oropharynx is clear. No oropharyngeal exudate or posterior oropharyngeal erythema.  Eyes:     General: No scleral icterus.       Right eye: No discharge.        Left eye: No discharge.     Conjunctiva/sclera: Conjunctivae normal.     Pupils: Pupils are equal, round, and reactive to light.  Neck:     Vascular: No carotid bruit.  Cardiovascular:     Rate and Rhythm: Normal rate and regular rhythm.     Pulses: Normal pulses.     Heart sounds: Normal heart sounds.  Pulmonary:     Effort: Pulmonary effort is normal. No respiratory distress.     Breath sounds: Normal breath sounds.  Abdominal:     General: Abdomen is flat. Bowel sounds are normal.     Palpations: Abdomen is soft.  Musculoskeletal:         General: Normal range of motion.     Cervical back: Normal range of motion.  Skin:    General: Skin is warm and dry.  Neurological:     General: No focal deficit present.     Mental Status: She is alert and oriented to person, place, and time. Mental status is at baseline.  Psychiatric:  Mood and Affect: Mood normal.        Behavior: Behavior normal.        Thought Content: Thought content normal.        Judgment: Judgment normal.    Subsequent Medicare wellness visit   Visit info / Clinical Intake: Medicare Wellness Visit Type:: Subsequent Annual Wellness Visit Persons participating in visit:: patient Medicare Wellness Visit Mode:: In-person (required for WTM) Information given by:: patient Interpreter Needed?: No Pre-visit prep was completed: yes AWV questionnaire completed by patient prior to visit?: yes Date:: 08/02/24 Living arrangements:: with family/others Patient's Overall Health Status Rating: good Typical amount of pain: some Does pain affect daily life?: (!) yes Are you currently prescribed opioids?: no  Dietary Habits and Nutritional Risks How many meals a day?: 3 Eats fruit and vegetables daily?: yes Most meals are obtained by: preparing own meals In the last 2 weeks, have you had any of the following?: none Diabetic:: no  Functional Status Activities of Daily Living (to include ambulation/medication): (!) Needs Assist Feeding: Independent Dressing/Grooming: Independent Bathing: Independent Toileting: Independent Transfer: Independent Ambulation: Independent with device- listed below Home Assistive Devices/Equipment: Cane Medication Administration: Independent Home Management: Independent Manage your own finances?: yes Primary transportation is: driving Concerns about vision?: no *vision screening is required for WTM* Concerns about hearing?: no  Fall Screening Falls in the past year?: 0 Number of falls in past year: 0 Was there an injury  with Fall?: 0 Fall Risk Category Calculator: 0 Patient Fall Risk Level: Low Fall Risk  Fall Risk Patient at Risk for Falls Due to: No Fall Risks Fall risk Follow up: Falls evaluation completed  Home and Transportation Safety: All rugs have non-skid backing?: yes All stairs or steps have railings?: yes Grab bars in the bathtub or shower?: yes Have non-skid surface in bathtub or shower?: yes Good home lighting?: yes Regular seat belt use?: yes Hospital stays in the last year:: (!) yes (hip replacement) How many hospital stays:: 1 Reason: hip replacement  Cognitive Assessment Difficulty concentrating, remembering, or making decisions? : no Will 6CIT or Mini Cog be Completed: yes What year is it?: 0 points What month is it?: 0 points Give patient an address phrase to remember (5 components): the cow jumped over the moon About what time is it?: 0 points Count backwards from 20 to 1: 0 points Say the months of the year in reverse: 0 points Repeat the address phrase from earlier: 0 points 6 CIT Score: 0 points  Advance Directives (For Healthcare) Does Patient Have a Medical Advance Directive?: Yes Does patient want to make changes to medical advance directive?: No - Guardian declined Type of Advance Directive: Healthcare Power of [place]; Living will Copy of Healthcare Power of Attorney in Chart?: No - copy requested Copy of Living Will in Chart?: No - copy requested  Reviewed/Updated  Reviewed/Updated: Reviewed All (Medical, Surgical, Family, Medications, Allergies, Care Teams, Patient Goals)    Vision Screening   Right eye Left eye Both eyes  Without correction     With correction 20/30 20/30 20/30       Depression/mood:  Flowsheet Row Office Visit from 01/15/2024 in [facility] [place]  PHQ-9 Total Score 2        Counseling: Counseling given: Not Answered     Lab orders based on risk factors: Laboratory update will be reviewed      Screening: Patient provided with a written and personalized 5-10 year screening schedule in the AVS. Health Maintenance  Topic Date Due   Flu Shot  06/18/2024   COVID-19 Vaccine (9 - 2025-26 season) 08/06/2024   Medicare Annual Wellness Visit  08/17/2024   DTaP/Tdap/Td vaccine (4 - Td or Tdap) 01/25/2032   Pneumococcal Vaccine for age over 59  Completed   DEXA scan (bone density measurement)  Completed   Zoster (Shingles) Vaccine  Completed   Meningitis B Vaccine  Aged Out     Provider List Update: Patient Care Team    Relationship Specialty Notifications Start End  Theophilus Andrews, Tully GRADE, MD PCP - General Internal Medicine  12/02/18        I have personally reviewed and noted the following in the patient's chart:   Medical and social history Use of alcohol , tobacco or illicit drugs  Current medications and supplements Functional ability and status Nutritional status Physical activity Advanced directives List of other physicians Hospitalizations, surgeries, and ER visits in previous 12 months Vitals Screenings to include cognitive, depression, and falls Referrals and appointments  In addition, I have reviewed and discussed with patient certain preventive protocols, quality metrics, and best practice recommendations. A written personalized care plan for preventive services as well as general preventive health recommendations were provided to patient.   Impression and Plan:  Encounter for Medicare annual wellness exam  Essential hypertension  Pure hypercholesterolemia  IGT (impaired glucose tolerance)  Encounter for preventive health examination  Gastroesophageal reflux disease, unspecified whether esophagitis present   -Recommend routine eye and dental care. -Healthy lifestyle discussed in detail. -Labs to be updated today. -Prostate cancer screening: N/A Health Maintenance  Topic Date Due   Flu Shot  06/18/2024   COVID-19 Vaccine (9 - 2025-26 season)  08/06/2024   Medicare Annual Wellness Visit  08/17/2024   DTaP/Tdap/Td vaccine (4 - Td or Tdap) 01/25/2032   Pneumococcal Vaccine for age over 40  Completed   DEXA scan (bone density measurement)  Completed   Zoster (Shingles) Vaccine  Completed   Meningitis B Vaccine  Aged Out     - Flu vaccine administered in office today. - She will do ambulatory blood pressure monitoring and return in 3 months for follow-up given elevated blood pressure noted in office today.    Tully Theophilus Andrews, MD [facility]

## 2024-09-29 ENCOUNTER — Ambulatory Visit (INDEPENDENT_AMBULATORY_CARE_PROVIDER_SITE_OTHER): Admitting: Rehabilitative and Restorative Service Providers"

## 2024-09-29 ENCOUNTER — Encounter: Payer: Self-pay | Admitting: Rehabilitative and Restorative Service Providers"

## 2024-09-29 DIAGNOSIS — M6281 Muscle weakness (generalized): Secondary | ICD-10-CM | POA: Diagnosis not present

## 2024-09-29 DIAGNOSIS — R262 Difficulty in walking, not elsewhere classified: Secondary | ICD-10-CM | POA: Diagnosis not present

## 2024-09-29 DIAGNOSIS — M25551 Pain in right hip: Secondary | ICD-10-CM
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
## 2024-09-30 ENCOUNTER — Encounter: Payer: Self-pay | Admitting: Internal Medicine

## 2024-09-30 ENCOUNTER — Ambulatory Visit: Attending: Internal Medicine | Admitting: Internal Medicine

## 2024-09-30 VITALS — BP 160/80 | HR 86 | Ht 66.0 in | Wt 140.0 lb

## 2024-09-30 DIAGNOSIS — M791 Myalgia, unspecified site: Secondary | ICD-10-CM

## 2024-09-30 DIAGNOSIS — T466X5A Adverse effect of antihyperlipidemic and antiarteriosclerotic drugs, initial encounter: Secondary | ICD-10-CM

## 2024-09-30 DIAGNOSIS — E785 Hyperlipidemia, unspecified: Secondary | ICD-10-CM | POA: Diagnosis not present

## 2024-09-30 DIAGNOSIS — T466X5D Adverse effect of antihyperlipidemic and antiarteriosclerotic drugs, subsequent encounter: Secondary | ICD-10-CM

## 2024-09-30 DIAGNOSIS — E7849 Other hyperlipidemia: Secondary | ICD-10-CM

## 2024-09-30 DIAGNOSIS — E7841 Elevated Lipoprotein(a): Secondary | ICD-10-CM | POA: Diagnosis not present

## 2024-09-30 NOTE — Therapy (Signed)
 OUTPATIENT PHYSICAL THERAPY TREATMENT  Patient Name: Teresa Martinez MRN: [id number] DOB:1941-03-23, 83 y.o., female Today's Date: 10/01/2024   END OF SESSION:  PT End of Session - 10/01/24 1037     Visit Number 14    Number of Visits 20    Date for Recertification  10/25/24    Authorization Type [employer] $20 copay    PT Start Time 1010    PT Stop Time 1048    PT Time Calculation (min) 38 min    Activity Tolerance Patient tolerated treatment well    Behavior During Therapy WFL for tasks assessed/performed              Past Medical History:  Diagnosis Date   Arthritis    Cervical spine degeneration    C5/C6   Chronic right shoulder pain    GERD (gastroesophageal reflux disease)    Hemiparesis affecting dominant side as late effect of cerebrovascular accident (HCC) 12/07/2014   Hyperlipemia    Hypertension    Menopausal syndrome    [name]   Overactive bladder    Statin intolerance    Stroke (HCC) 2015   weakness on right side   Varicose vein of leg    left leg   Past Surgical History:  Procedure Laterality Date   ABDOMINAL HYSTERECTOMY     1985   BAND HEMORRHOIDECTOMY     2010   CATARACT EXTRACTION, BILATERAL     CHOLECYSTECTOMY     1950   LUMBAR LAMINECTOMY Bilateral 08/03/2018   Dr. Wallene, L3 laminotomy, L4-L5 laminectomy with neural foraminal decompression.   PAROTIDECTOMY     30 years ago   TOTAL HIP ARTHROPLASTY Right 07/30/2024   Procedure: ARTHROPLASTY, HIP, TOTAL, ANTERIOR APPROACH;  Surgeon: Vernetta Lonni GRADE, MD;  Location: [facility];  Service: Orthopedics;  Laterality: Right;   TOTAL KNEE ARTHROPLASTY Left 09/10/2022   Procedure: LEFT TOTAL KNEE ARTHROPLASTY;  Surgeon: Vernetta Lonni GRADE, MD;  Location: [facility];  Service: Orthopedics;  Laterality: Left;   Patient Active Problem List   Diagnosis Date Noted   Status post total replacement of right hip 07/30/2024   Status post left knee replacement 09/10/2022   IGT  (impaired glucose tolerance) 07/27/2021   Preop testing 07/09/2018   Degenerative lumbar spinal stenosis 05/12/2018   Statin myopathy 08/29/2016   Iliotibial band syndrome of right side 04/23/2016   Acromioclavicular arthrosis 06/30/2015   Adhesive capsulitis of right shoulder 02/06/2015   Dysarthria due to cerebrovascular accident 01/09/2015   Spastic neurogenic bladder 10/31/2014   Right rotator cuff tendonitis 10/19/2014   Stenosis of cervical spine region    Cerebral infarction due to thrombosis of left middle cerebral artery (HCC)    Left pontine stroke (HCC)    TIA (transient ischemic attack) 10/14/2014   Overactive bladder 10/14/2014   H/O: CVA (cerebrovascular accident) 10/14/2014   Essential hypertension 10/10/2009   INSOMNIA 09/15/2009   Pure hypercholesterolemia 11/28/2008   MENOPAUSAL SYNDROME 11/28/2008    PCP: Theophilus Andrews MD  REFERRING PROVIDER: Vernetta Lonni GRADE, MD  REFERRING DIAG: [phone] (ICD-10-CM) - Status post total replacement of right hip  THERAPY DIAG:  Pain in right hip  Muscle weakness (generalized)  Difficulty in walking, not elsewhere classified  Chronic pain of left knee  Localized edema  Rationale for Evaluation and Treatment: Rehabilitation  ONSET DATE: 07/30/2024 surgery Rt THA  SUBJECTIVE:   SUBJECTIVE STATEMENT: Pt reported she is  stressed  but not about her balance or hip.  PERTINENT HISTORY: PMH: CVA with Rt hemiparesis, HTN, neurogenic bladder.  Lt TKA 09/10/2022   PAIN:  NPRS scale: no specific pain upon arrival.  Pain location: anterior, posterior/lateral, even into thigh.  Pain description: soreness Aggravating factors: lying on Rt side, WB pressure.  Relieving factors: sit/rest. OTC medicine.   PRECAUTIONS: Anterior hip  WEIGHT BEARING RESTRICTIONS: No  FALLS:  Has patient fallen in last 6 months? No  LIVING ENVIRONMENT: Lives with: primary alone but has children staying at this time.  Lives  in: House/apartment Stairs:5 to enter at front, 2 in back with handrail on Lt  Has following equipment at home:FWW, SPC  OCCUPATION: Retired  PLOF: Independent, hobbies - read, exercise routine   PATIENT GOALS: walk, drive  OBJECTIVE:   PATIENT SURVEYS:  Patient-Specific Activity Scoring Scheme  0 represents "unable to perform." 10 represents "able to perform at prior level. 0 1 2 3 4 5 6 7 8 9  10 (Date and Score)   Activity Eval  08/16/2024  09/06/2024 09/16/2024   1. Driving  0 8  10  2. Walking  4 8  8   3. Cooking 2 10 10   4. laundry 2 7 10   5. House cleaning 5 5 7   Score 2.6avg 7.6 9   Total score = sum of the activity scores/number of activities Minimum detectable change (90%CI) for average score = 2 points Minimum detectable change (90%CI) for single activity score = 3 points  COGNITION: 08/16/2024 Overall cognitive status: WFL    SENSATION: 08/16/2024 No impairment observed in dermatomes.   MUSCLE LENGTH: 08/16/2024 No specific testing.   POSTURE:  08/16/2024 Mild weight shift off Rt leg to Lt in standing.   PALPATION: 08/16/2024 Tenderness to light touch, trigger points/muscle tenderness in Rt glute max, med, min, anterior/lateral quad Rt.   LOWER EXTREMITY ROM:  08/16/2024: Held formalized ROM testing today   ROM Right Eval 08/16/2024 Left Eval 08/16/2024  Hip flexion    Hip extension    Hip abduction    Hip adduction    Hip internal rotation    Hip external rotation    Knee flexion    Knee extension    Ankle dorsiflexion    Ankle plantarflexion    Ankle inversion    Ankle eversion     (Blank rows = not tested)  LOWER EXTREMITY MMT:  MMT Right Eval 08/16/2024 Left Eval 08/16/2024 Right 08/30/2024  Hip flexion 4/5 5/5 5/5  Hip extension     Hip abduction 3+/5    Hip adduction     Hip internal rotation     Hip external rotation     Knee flexion 5/5 5/5 5/5  Knee extension 4/5 5/5 5/5  Ankle dorsiflexion 4+/5 5/5 5/5  Ankle  plantarflexion     Ankle inversion     Ankle eversion      (Blank rows = not tested)  LOWER EXTREMITY SPECIAL TESTS:  08/16/2024 No   FUNCTIONAL TESTS:  09/29/2024: Able to perform step up 6 inch step with each LE with minimal UE assist.  Eccentric step down control lacking with both legs.    09/16/2024 6 minute walk test: no AD for 836'3 5x STS: 12.57s no UE use from 18 arm chair   09/13/2024: TUG with SPC: 14.2 seconds TUG independently : 12.20 seconds 18 inch chair transfer : 1st try without UE assist.   08/25/2024:  TUG with SPC : 20 seconds.   08/16/2024 18 inch chair transfer: unable s UE assist  Lt SLS: not tested  Rt SLS: unable  TUG with FWW;  21.62 seconds  GAIT: 09/06/2024: Ambulation with SPC to clinic.  Able to do short distances in clinic independent.   08/16/2024 Household distances in clinic with FWW with step through gait pattern.                                                                                                                                                                        TODAY'S TREATMENT     DATE:10/01/24 Therex: Nustep Lvl 6 12 mins for ROM, aerobic exercise  Neuro Re-ed (balance improvements, coordination) Tandem stance 1 min x 2 bilateral on foam in // bars with occasional HHA on bars., SBA Lateral balance  on foam bar 1 min with SBA Alternating toe tapping while standing on foam with 6 inch step (net approx. 4 inch height) x 12 bilateral occasional HHA on bar, with SBA Step ups on to foam 2x10 with UE bar use occasionally and SBA  TherActivity Leg press double leg 100 lbs x 20  Leg press single leg , performed bilaterally 56 lbs 2 x 15  Lateral step down 4 inch step x 12 bilateral      TODAY'S TREATMENT     DATE: 09/29/2024 Therex: Nustep Lvl 6 12 mins for ROM, aerobic exercise   Neuro Re-ed (balance improvements, coordination) Tandem stance 1 min x 2 bilateral on foam in // bars with occasional HHA on bars.,  SBA Lateral stepping on foam bar 6 ft x 3 each way with moderate HHA on bar , SBA Alternating toe tapping while standing on foam with 6 inch step (net approx. 4 inch height) x 10 bilateral occasional HHA on bar, with SBA  TherActivity Leg press double leg 100 lbs x 20  Leg press single leg , performed bilaterally 56 lbs 2 x 15  Lateral step down 4 inch step x 10 bilateral    TODAY'S TREATMENT     DATE: 11/3/025 Therex: Nustep Lvl 6 10 mins for ROM, aerobic exercise   Neuro Re-ed (balance improvements, coordination) Tandem stance 1 min x 2 bilateral on foam in // bars with occasional HHA on bars.  Feet together stance across foam bar in // bars eyes open 1 min, head turns x 20 each way with min A at times to prevent LOB.  Step to pattern over 6 inch hurdle in // bars 10 ft x 4 leading with each LE, lateral stepping 10 ft x 3 each way.  SBA.   TherActivity Step up forward 6 inch step with Lt hand lightly on bar x 10 bilateral LE Flight of stairs reciprocal gait going down full fight with supervision.    TODAY'S TREATMENT  DATE: 11/3/025 Therex: Nustep Lvl 6 10 mins for ROM, aerobic exercise Supine bridge with green band around knees for hip abduction hold 2 x 10 Supine guided Thomas stretch to neutral Rt leg with Lt knee to chest 30 sec x 3    Neuro Re-ed SLS with contralateral leg tapping 3 anteriorly placed cones in // bars with occasional HHA, SBA x 6 each cone, performed bilaterally  Fitter rocker board fwd/back light slow touching in // bars with moderate HHA on bar x 25 each way   TherActivity Leg press double leg 100 lbs x 20  Leg press single leg , performed bilaterally 56 lbs 2 x 15  Step up forward with slow lowering reverse step down 6 inch step in // bars with Lt hand assist on bar     TODAY'S TREATMENT     DATE:  09/16/2024 Physical Performance: Patient completed 6-minute walk test with no AD with results noted above and discussed with patient  Patient  completed 5xSTS with no UE use from 18 inch chair with results noted above and discussed with patient   TherEx:  Nustep level 6 for 5 minutes with bilat UE and LE  Bilat leg press 2x15 with 100#  Unilat leg press 2x15 with 56#  Knee extension machine with bilat up and unilat down 3x6 with each leg  Supine SLR with 3# ankle weight 2x12 each side    TODAY'S TREATMENT     DATE:  09/13/2024 Therex: Nustep lvl 6 UE/LE for ROM, endurance - 10 mins  Supine bridge 2-3 sec hold 2 x 10  Supine hooklying clam shell green band 2 x 20 bilateral with isometric leg holding steady.  Supine SKC 30 sec x 3 bilaterally   Neuro Re-ed Forward stepping weight shift x 15 bilateral with mirror feedback Retro stepping weight shift x 15 bilateral in // bars with occasional HHA, mirror feedback Fwd/back ambulation 10 ft in // bars with focus on equal step lengths bilateral. 10 ft x 5 each way with supervision. Banker.   TherActivity Flight of stairs up/down with reciprocal gait pattern with supervision and bilateral hand rail assist.  Performed going down full flight with reciprocal gait pattern and Lt hand on rail only     PATIENT EDUCATION:  08/16/2024 Education details: HEP, POC Person educated: Patient Education method: Explanation, Demonstration, Verbal cues, and Handouts Education comprehension: verbalized understanding, returned demonstration, and verbal cues required  HOME EXERCISE PROGRAM: Access Code: [email or web address] URL: [email or web address] Date: 08/16/2024 Prepared by: Ozell Silvan  Exercises - Supine Bridge  - 1-2 x daily - 7 x weekly - 1-2 sets - 10 reps - 2 hold - Seated Quad Set  - 2-3 x daily - 7 x weekly - 1 sets - 10 reps - 5 hold - Clamshell (Mirrored)  - 1-2 x daily - 7 x weekly - 2-3 sets - 10-15 reps - Seated Isometric Knee Extension  - 2-3 x daily - 7 x weekly - 1 sets - 10 reps - 5-10 hold - Retro Step  - 1-2 x daily - 7 x weekly - 1 sets - 10-20 reps -  [place]  - 1-2 x daily - 7 x weekly - 1 sets - 1-2 mins hold  ASSESSMENT:  CLINICAL IMPRESSION: Some LOB while stepping on to foam, but able to correct with use if bars.  OBJECTIVE IMPAIRMENTS: Abnormal gait, decreased activity tolerance, decreased balance, decreased coordination, decreased endurance, decreased mobility, difficulty walking, decreased ROM, decreased strength, increased fascial  restrictions, impaired perceived functional ability, increased muscle spasms, impaired flexibility, improper body mechanics, and pain.   ACTIVITY LIMITATIONS: carrying, lifting, bending, sitting, standing, squatting, sleeping, stairs, transfers, bed mobility, dressing, hygiene/grooming, and locomotion level  PARTICIPATION LIMITATIONS: meal prep, cleaning, laundry, interpersonal relationship, driving, shopping, and community activity  PERSONAL FACTORS: PMH: CVA with Rt hemiparesis, HTN, neurogenic bladder Lt TKA 09/10/2022,  are also affecting patient's functional outcome.   REHAB POTENTIAL: Good  CLINICAL DECISION MAKING: Stable/uncomplicated  EVALUATION COMPLEXITY: Low   GOALS: Goals reviewed with patient? Yes  SHORT TERM GOALS: (target date for Short term goals are 3 weeks 09/06/2024)   1.  Patient will demonstrate independent use of home exercise program to maintain progress from in clinic treatments.  Goal status: Met  LONG TERM GOALS: (target dates for all long term goals are 10 weeks  10/25/2024 )   1. Patient will demonstrate/report pain at worst less than or equal to 2/10 to facilitate minimal limitation in daily activity secondary to pain symptoms.  Goal status: on going 09/29/2024   2. Patient will demonstrate independent use of home exercise program to facilitate ability to maintain/progress functional gains from skilled physical therapy services.  Goal status: on going 09/29/2024   3. Patient will demonstrate Patient specific functional scale avg > or = 8/10 to indicate  reduced disability due to condition.   Goal status: GOAL MET, 09/16/2024   4.  Patient will demonstrate Rt LE MMT 5/5 throughout to faciltiate usual transfers, stairs, squatting at [facility] for daily life.   Goal status: on going 09/29/2024   5.  Patient will demonstrate independent ambulation community distances > 500 ft to facilitate community integration.  Goal status:on going 09/29/2024   6.  Patient will demonstrate TUG < 14 seconds independent to reduce fall risk.  Goal status: on going 09/29/2024   7.  Patient will demonstrate ascending/descending stairs reciprocally s UE assist for community integration.   Goal Status:on going 09/29/2024   PLAN:  PT FREQUENCY: 1-2x/week  PT DURATION: 10 weeks  PLANNED INTERVENTIONS: Can include 02853- PT Re-evaluation, 97110-Therapeutic exercises, 97530- Therapeutic activity, 97112- Neuromuscular re-education, 97535- Self Care, [id number]- Manual therapy, [phone]- Gait training,[id number]- Electrical stimulation (unattended), [id number] Physical performance testing  Patient/Family education, Balance training, Stair training, Taping, Dry Needling, Joint mobilization, Joint manipulation, Spinal manipulation, Spinal mobilization, Scar mobilization, Vestibular training, Visual/preceptual remediation/compensation, DME instructions, Cryotherapy, and Moist heat.  All performed as medically necessary.  All included unless contraindicated  PLAN FOR NEXT SESSION:  balance improvements, eccentric stair control. SABRA Burnard Meth, PT 10/01/24  10:42 AM           Referring diagnosis? S03.358 (ICD-10-CM) - Status post total replacement of right hip Treatment diagnosis? (if different than referring diagnosis) M25.551, M62.81, R 26.2 What was this (referring dx) caused by? [x]  Surgery []  Fall []  Ongoing issue []  Arthritis []  Other: ____________  Laterality: [x]  Rt []  Lt []  Both  Check all possible CPT codes:  *CHOOSE 10 OR LESS*    See Planned  Interventions listed in the Plan section of the Evaluation.

## 2024-09-30 NOTE — Progress Notes (Signed)
 LIPID CLINIC CONSULT NOTE  Chief Complaint:  Follow-up dyslipidemia  Primary Care Physician: Theophilus Andrews, Tully GRADE, MD  Primary Cardiologist:  None  HPI:  Teresa Martinez is a 83 y.o. female who is being seen today for the evaluation of dyslipidemia at the request of Theophilus Andrews, Jonna*.  This is a pleasant 83 year old female kindly referred for evaluation management of dyslipidemia.  Interestingly she has been very athletic most of her life in fact competing in marathon races even into her 51s.  Unfortunately she had a stroke in 2016 and since then had some mediate issues with hemiparesis but says she has never been quite the same.  She also has a history of hypertension, dyslipidemia, mild bilateral carotid disease and some cerebrovascular atherosclerosis based on CT imaging.  She has been intolerant to statins and reports a strong family history of high cholesterol in both of her twin daughters, her aunt, her brother and other family members.  Initial blood pressure was 162/94 however she said it is typically running around 130/78.  Lipids were reassessed in March showing total cholesterol 244, HDL 55, HDL 172 and triglycerides 86.  She has been on ezetimibe  but she says it does not seem to be effective for her.  Previously she had taken statins which caused her severe side effects.  She had tried 3 different statins with her prior primary care provider to cause significant inability to walk and she said she would not ever take them again.  08/23/2021  Teresa Martinez returns today for follow-up.  She is done well on Repatha .  She seems to be tolerating however does have some injection site redness.  She said initially after the first several doses it was not an issue however she has now developed some that may last up to about a week.  She says it is itchy and tender but tolerable.  Her cholesterol though has responded nicely.  Her total cholesterol is now 168, HDL 61,  LDL 89 and triglycerides 98.  Her LDL has come down from 172.  Particle number was [id number].  Her LP(a) is elevated and probably was implicated in her stroke.  I suspect this number is now about 20 to 30% lower than it was previously, at 235.  Although she had history of stroke, according to the criteria for the [employer] study for a specific LP(a) lowering medication, she would not qualify because she would have had to have had prior PCI and history of stroke.  01/24/2023  Teresa Martinez returns today for follow-up.  She recently had knee surgery and has had some pain associated with that.  She also lost her husband recently and her brother.  She has been having a lot of stress.  Blood pressure was a little elevated today however came down to 150/90.  This is being monitored by her PCP.  She feels like stress is a factor.  Cholesterol has crept up slightly but still remains reasonably well treated.  Total 173, triglycerides 60, HDL 59 and LDL 102.  She is on Repatha  and ezetimibe  and reports compliance with medications.  03/30/2024  Teresa Martinez is a follow-up.  She called in indicating that she was having some joint and muscle pains and was concerned this might be related to Repatha .  I advised she could take a holiday from that and skip a dose.  She ended up skipping 3 doses but noted no difference in her symptoms.  She then restarted the Repatha  and had  repeat labs.  Her labs are essentially unchanged from prior labs about 2 years ago.  This included an LDL particle number somewhat elevated at 1250, LDL 88, HDL 51 and triglycerides 101.  Small LDL particle number of 653, again above target.  Her goal LDL is less than 70 with a lower particle number.  She is on combination therapy with Repatha  and ezetimibe .  Diet is already fairly healthy and she is active as possible.  09/30/2024  Teresa Martinez is seen today for follow-up.  She seems to be tolerating Nexlizet  in combination with Repatha .  Her  lipids have improved substantially with an LDL particle number of 715, LDL 48, HDL 46 and triglycerides 97.  She does have a history of elevated LP(a) at 235 nmol/L which was assessed in 2022 however I do not believe it has been repeated.  She recently underwent hip replacement surgery and seems to be doing well with that.  PMHx:  Past Medical History:  Diagnosis Date   Arthritis    Cervical spine degeneration    C5/C6   Chronic right shoulder pain    GERD (gastroesophageal reflux disease)    Hemiparesis affecting dominant side as late effect of cerebrovascular accident (HCC) 12/07/2014   Hyperlipemia    Hypertension    Menopausal syndrome    [name]   Overactive bladder    Statin intolerance    Stroke (HCC) 2015   weakness on right side   Varicose vein of leg    left leg    Past Surgical History:  Procedure Laterality Date   ABDOMINAL HYSTERECTOMY     1985   BAND HEMORRHOIDECTOMY     2010   CATARACT EXTRACTION, BILATERAL     CHOLECYSTECTOMY     1950   LUMBAR LAMINECTOMY Bilateral 08/03/2018   Dr. Wallene, L3 laminotomy, L4-L5 laminectomy with neural foraminal decompression.   PAROTIDECTOMY     30 years ago   TOTAL HIP ARTHROPLASTY Right 07/30/2024   Procedure: ARTHROPLASTY, HIP, TOTAL, ANTERIOR APPROACH;  Surgeon: Vernetta Lonni GRADE, MD;  Location: [facility];  Service: Orthopedics;  Laterality: Right;   TOTAL KNEE ARTHROPLASTY Left 09/10/2022   Procedure: LEFT TOTAL KNEE ARTHROPLASTY;  Surgeon: Vernetta Lonni GRADE, MD;  Location: [facility];  Service: Orthopedics;  Laterality: Left;    FAMHx:  Family History  Problem Relation Age of Onset   Heart failure Father    Diabetes Mother    Hypertension Mother    Stroke Mother    Cirrhosis Sister    Diabetes Sister    Hyperlipidemia Brother    Hypertension Brother    Stroke Brother    Diabetes Brother     SOCHx:   reports that she has never smoked. She has never used smokeless tobacco. She reports that  she does not drink alcohol  and does not use drugs.  ALLERGIES:  Allergies  Allergen Reactions   Oxycodone  Nausea And Vomiting   Statins Other (See Comments)    Muscle pain and unable to walk Muscle pain and unable to walk   Wasp Venom    Latex Other (See Comments)    Burns her skin    ROS: Pertinent items noted in HPI and remainder of comprehensive ROS otherwise negative.  HOME MEDS: Current Outpatient Medications on File Prior to Visit  Medication Sig Dispense Refill   aspirin  EC 325 MG tablet Take 325 mg by mouth at bedtime.     Bempedoic Acid-Ezetimibe  (NEXLIZET ) 180-10 MG TABS Take 1 tablet by mouth  daily. (Patient taking differently: Take 1 tablet by mouth at bedtime.) 90 tablet 2   Calcium  Carbonate-Vit D-Min (CALTRATE PLUS PO) Take 1 tablet by mouth in the morning.     carboxymethylcellulose (REFRESH PLUS) 0.5 % SOLN Place 1-2 drops into both eyes 3 (three) times daily as needed (dry/irritated eyes.).     Evolocumab  (REPATHA  SURECLICK) 140 MG/ML SOAJ Inject 140 mg into the skin every 14 (fourteen) days. 6 mL 3   fesoterodine  (TOVIAZ ) 4 MG TB24 tablet TAKE 1 TABLET(4 MG) BY MOUTH DAILY 90 tablet 1   Magnesium  250 MG TABS Take 250 mg by mouth in the morning.     Multiple Vitamin (MULTIVITAMIN WITH MINERALS) TABS tablet Take 1 tablet by mouth daily. Centrum     Omega-3 Fatty Acids (FISH OIL ULTRA) 1400 MG CAPS Take 1,400 mg by mouth in the morning.     pantoprazole  (PROTONIX ) 40 MG tablet Take 1 tablet (40 mg total) by mouth daily. 90 tablet 1   valsartan  (DIOVAN ) 160 MG tablet TAKE 1 TABLET(160 MG) BY MOUTH DAILY 90 tablet 0   methocarbamol  (ROBAXIN ) 500 MG tablet Take 1 tablet (500 mg total) by mouth every 6 (six) hours as needed for muscle spasms. (Patient not taking: Reported on 09/30/2024) 30 tablet 0   traMADol  (ULTRAM ) 50 MG tablet Take 1-2 tablets (50-100 mg total) by mouth every 6 (six) hours as needed for severe pain (pain score 7-10). (Patient not taking: Reported on  09/30/2024) 30 tablet 0   No current facility-administered medications on file prior to visit.    LABS/IMAGING: No results found for this or any previous visit (from the past 48 hours). No results found.  LIPID PANEL:    Component Value Date/Time   CHOL 158 09/10/2023 0931   CHOL 173 01/17/2023 0921   TRIG 130.0 09/10/2023 0931   HDL 54.80 09/10/2023 0931   HDL 59 01/17/2023 0921   CHOLHDL 3 09/10/2023 0931   VLDL 26.0 09/10/2023 0931   LDLCALC 78 09/10/2023 0931   LDLCALC 102 (H) 01/17/2023 0921   LDLCALC 142 (H) 07/26/2020 1009   LDLDIRECT 205.1 07/30/2013 0946    WEIGHTS: Wt Readings from Last 3 Encounters:  09/30/24 140 lb (63.5 kg)  09/28/24 137 lb 14.4 oz (62.6 kg)  07/31/24 133 lb 13.8 oz (60.7 kg)    VITALS: BP (!) 160/80   Pulse 86   Ht 5' 6 (1.676 m)   Wt 140 lb (63.5 kg)   SpO2 96%   BMI 22.60 kg/m   EXAM: Deferred  EKG: Deferred  ASSESSMENT: Probable familial hyperlipidemia, goal LDL less than 55 History of stroke (2016) Mild bilateral carotid disease-ultrasound in 2015 Hypertension Family history of high cholesterol and multiple family members and twin daughters Elevated LP(a)-235  PLAN: 1.   Ms. Martinez has a history of PAD and stroke with a goal LDL less than 55 and high LP(a).  Now on combination therapy with Nexlizet  and Repatha  her LDL is 48.  She is doing very well with this.  Would recommend continuing these therapies.  Will repeat an LP(a) to see if it has come down at all on the Repatha  with an NMR in about a year.  Follow-up at that time with me at [facility] for general cardiology.  Will consider repeat carotid Dopplers at that time and assessment of blood pressure and other factors.  Vinie KYM Maxcy, MD, [facility], FNLA, FACP  [place]  [facility]  Medical Director of the Advanced Lipid Disorders &  Cardiovascular Risk Reduction Clinic Diplomate of the American Board of Clinical Lipidology Attending Cardiologist   Direct Dial: [phone]  Fax: [phone]  Website:  [email or web address]   Vinie BROCKS Hannia Matchett 09/30/2024, 3:17 PM

## 2024-09-30 NOTE — Patient Instructions (Signed)
 Medication Instructions:   NO CHANGES  *If you need a refill on your cardiac medications before your next appointment, please call your pharmacy*  Lab Work:  FASTING lab work in 1 year  If you have labs (blood work) drawn today and your tests are completely normal, you will receive your results only by: MyChart Message (if you have MyChart) OR A paper copy in the mail If you have any lab test that is abnormal or we need to change your treatment, we will call you to review the results.   Follow-Up: At [facility], you and your health needs are our priority.  As part of our continuing mission to provide you with exceptional heart care, our providers are all part of one team.  This team includes your primary Cardiologist (physician) and Advanced Practice Providers or APPs (Physician Assistants and Nurse Practitioners) who all work together to provide you with the care you need, when you need it.  Your next appointment:    12 months with Dr. Mona   We recommend signing up for the patient portal called MyChart.  Sign up information is provided on this After Visit Summary.  MyChart is used to connect with patients for Virtual Visits (Telemedicine).  Patients are able to view lab/test results, encounter notes, upcoming appointments, etc.  Non-urgent messages can be sent to your provider as well.   To learn more about what you can do with MyChart, go to [email or web address].   Other Instructions

## 2024-10-01 ENCOUNTER — Ambulatory Visit (INDEPENDENT_AMBULATORY_CARE_PROVIDER_SITE_OTHER)

## 2024-10-01 DIAGNOSIS — R262 Difficulty in walking, not elsewhere classified: Secondary | ICD-10-CM | POA: Diagnosis not present

## 2024-10-01 DIAGNOSIS — M6281 Muscle weakness (generalized): Secondary | ICD-10-CM | POA: Diagnosis not present

## 2024-10-01 DIAGNOSIS — G8929 Other chronic pain: Secondary | ICD-10-CM

## 2024-10-01 DIAGNOSIS — R600 Localized edema: Secondary | ICD-10-CM | POA: Diagnosis not present

## 2024-10-01 DIAGNOSIS — M25562 Pain in left knee: Secondary | ICD-10-CM

## 2024-10-01 DIAGNOSIS — M25551 Pain in right hip: Secondary | ICD-10-CM

## 2024-10-04 ENCOUNTER — Encounter: Payer: Self-pay | Admitting: Rehabilitative and Restorative Service Providers"

## 2024-10-04 ENCOUNTER — Ambulatory Visit: Admitting: Rehabilitative and Restorative Service Providers"

## 2024-10-04 DIAGNOSIS — M25562 Pain in left knee: Secondary | ICD-10-CM

## 2024-10-04 DIAGNOSIS — M25551 Pain in right hip: Secondary | ICD-10-CM

## 2024-10-04 DIAGNOSIS — R262 Difficulty in walking, not elsewhere classified: Secondary | ICD-10-CM

## 2024-10-04 DIAGNOSIS — R600 Localized edema: Secondary | ICD-10-CM | POA: Diagnosis not present

## 2024-10-04 DIAGNOSIS — G8929 Other chronic pain: Secondary | ICD-10-CM

## 2024-10-04 DIAGNOSIS — M6281 Muscle weakness (generalized): Secondary | ICD-10-CM

## 2024-10-04 NOTE — Therapy (Signed)
 OUTPATIENT PHYSICAL THERAPY TREATMENT  Patient Name: Teresa Martinez MRN: [id number] DOB:1941-01-10, 83 y.o., female Today's Date: 10/04/2024   END OF SESSION:  PT End of Session - 10/04/24 1549     Visit Number 15    Number of Visits 20    Date for Recertification  10/25/24    Authorization Type [employer] $20 copay    Authorization - Visit Number 5    PT Start Time 1545    PT Stop Time 1625    PT Time Calculation (min) 40 min    Activity Tolerance Patient tolerated treatment well    Behavior During Therapy WFL for tasks assessed/performed               Past Medical History:  Diagnosis Date   Arthritis    Cervical spine degeneration    C5/C6   Chronic right shoulder pain    GERD (gastroesophageal reflux disease)    Hemiparesis affecting dominant side as late effect of cerebrovascular accident (HCC) 12/07/2014   Hyperlipemia    Hypertension    Menopausal syndrome    [name]   Overactive bladder    Statin intolerance    Stroke (HCC) 2015   weakness on right side   Varicose vein of leg    left leg   Past Surgical History:  Procedure Laterality Date   ABDOMINAL HYSTERECTOMY     1985   BAND HEMORRHOIDECTOMY     2010   CATARACT EXTRACTION, BILATERAL     CHOLECYSTECTOMY     1950   LUMBAR LAMINECTOMY Bilateral 08/03/2018   Dr. Wallene, L3 laminotomy, L4-L5 laminectomy with neural foraminal decompression.   PAROTIDECTOMY     30 years ago   TOTAL HIP ARTHROPLASTY Right 07/30/2024   Procedure: ARTHROPLASTY, HIP, TOTAL, ANTERIOR APPROACH;  Surgeon: Vernetta Lonni GRADE, MD;  Location: [facility];  Service: Orthopedics;  Laterality: Right;   TOTAL KNEE ARTHROPLASTY Left 09/10/2022   Procedure: LEFT TOTAL KNEE ARTHROPLASTY;  Surgeon: Vernetta Lonni GRADE, MD;  Location: [facility];  Service: Orthopedics;  Laterality: Left;   Patient Active Problem List   Diagnosis Date Noted   Status post total replacement of right hip 07/30/2024   Status post  left knee replacement 09/10/2022   IGT (impaired glucose tolerance) 07/27/2021   Preop testing 07/09/2018   Degenerative lumbar spinal stenosis 05/12/2018   Statin myopathy 08/29/2016   Iliotibial band syndrome of right side 04/23/2016   Acromioclavicular arthrosis 06/30/2015   Adhesive capsulitis of right shoulder 02/06/2015   Dysarthria due to cerebrovascular accident 01/09/2015   Spastic neurogenic bladder 10/31/2014   Right rotator cuff tendonitis 10/19/2014   Stenosis of cervical spine region    Cerebral infarction due to thrombosis of left middle cerebral artery (HCC)    Left pontine stroke (HCC)    TIA (transient ischemic attack) 10/14/2014   Overactive bladder 10/14/2014   H/O: CVA (cerebrovascular accident) 10/14/2014   Essential hypertension 10/10/2009   INSOMNIA 09/15/2009   Pure hypercholesterolemia 11/28/2008   MENOPAUSAL SYNDROME 11/28/2008    PCP: Theophilus Andrews MD  REFERRING PROVIDER: Vernetta Lonni GRADE, MD  REFERRING DIAG: [phone] (ICD-10-CM) - Status post total replacement of right hip  THERAPY DIAG:  Pain in right hip  Muscle weakness (generalized)  Difficulty in walking, not elsewhere classified  Chronic pain of left knee  Localized edema  Rationale for Evaluation and Treatment: Rehabilitation  ONSET DATE: 07/30/2024 surgery Rt THA  SUBJECTIVE:   SUBJECTIVE STATEMENT: Pt indicated having some increase in soreness yesterday  with the weather change.   PERTINENT HISTORY: PMH: CVA with Rt hemiparesis, HTN, neurogenic bladder.  Lt TKA 09/10/2022   PAIN:  NPRS scale: no specific pain upon arrival.  Pain location: anterior, posterior/lateral, even into thigh.  Pain description: soreness Aggravating factors: lying on Rt side, WB pressure.  Relieving factors: sit/rest. OTC medicine.   PRECAUTIONS: Anterior hip  WEIGHT BEARING RESTRICTIONS: No  FALLS:  Has patient fallen in last 6 months? No  LIVING ENVIRONMENT: Lives with: primary  alone but has children staying at this time.  Lives in: House/apartment Stairs:5 to enter at front, 2 in back with handrail on Lt  Has following equipment at home:FWW, SPC  OCCUPATION: Retired  PLOF: Independent, hobbies - read, exercise routine   PATIENT GOALS: walk, drive  OBJECTIVE:   PATIENT SURVEYS:  Patient-Specific Activity Scoring Scheme  0 represents "unable to perform." 10 represents "able to perform at prior level. 0 1 2 3 4 5 6 7 8 9  10 (Date and Score)   Activity Eval  08/16/2024  09/06/2024 09/16/2024   1. Driving  0 8  10  2. Walking  4 8  8   3. Cooking 2 10 10   4. laundry 2 7 10   5. House cleaning 5 5 7   Score 2.6avg 7.6 9   Total score = sum of the activity scores/number of activities Minimum detectable change (90%CI) for average score = 2 points Minimum detectable change (90%CI) for single activity score = 3 points  COGNITION: 08/16/2024 Overall cognitive status: WFL    SENSATION: 08/16/2024 No impairment observed in dermatomes.   MUSCLE LENGTH: 08/16/2024 No specific testing.   POSTURE:  08/16/2024 Mild weight shift off Rt leg to Lt in standing.   PALPATION: 10/04/2024:  Trigger points Rt glute med/min, lateral quad.  Improved after massage gun.   08/16/2024 Tenderness to light touch, trigger points/muscle tenderness in Rt glute max, med, min, anterior/lateral quad Rt.   LOWER EXTREMITY ROM:  08/16/2024: Held formalized ROM testing today   ROM Right Eval 08/16/2024 Left Eval 08/16/2024  Hip flexion    Hip extension    Hip abduction    Hip adduction    Hip internal rotation    Hip external rotation    Knee flexion    Knee extension    Ankle dorsiflexion    Ankle plantarflexion    Ankle inversion    Ankle eversion     (Blank rows = not tested)  LOWER EXTREMITY MMT:  MMT Right Eval 08/16/2024 Left Eval 08/16/2024 Right 08/30/2024  Hip flexion 4/5 5/5 5/5  Hip extension     Hip abduction 3+/5    Hip adduction     Hip  internal rotation     Hip external rotation     Knee flexion 5/5 5/5 5/5  Knee extension 4/5 5/5 5/5  Ankle dorsiflexion 4+/5 5/5 5/5  Ankle plantarflexion     Ankle inversion     Ankle eversion      (Blank rows = not tested)  LOWER EXTREMITY SPECIAL TESTS:  08/16/2024 No   FUNCTIONAL TESTS:  09/29/2024: Able to perform step up 6 inch step with each LE with minimal UE assist.  Eccentric step down control lacking with both legs.    09/16/2024 6 minute walk test: no AD for 836'3 5x STS: 12.57s no UE use from 18 arm chair   09/13/2024: TUG with SPC: 14.2 seconds TUG independently : 12.20 seconds 18 inch chair transfer : 1st try without UE assist.  08/25/2024:  TUG with SPC : 20 seconds.   08/16/2024 18 inch chair transfer: unable s UE assist  Lt SLS: not tested  Rt SLS: unable  TUG with FWW;  21.62 seconds  GAIT: 09/06/2024: Ambulation with SPC to clinic.  Able to do short distances in clinic independent.   08/16/2024 Household distances in clinic with FWW with step through gait pattern.                                                                                                                                                                         TODAY'S TREATMENT     DATE:10/01/24 Therex: Nustep Lvl 6 10.5 mins for ROM, aerobic exercise.  Lt sidelying , Rt hip clam shells 3 x 10  Supine hooklying bridge with green band around knees hip abduction hold out during, 2 x 10 Incline gastroc stretch 30 sec x 3 bilateral   Neuro Re-ed Tandem stance on foam 1 min x 2 bilaterally with occasional HHA on // bars Feet together stance across foam bar 1 min  Tandem ambulation on foam in // bars 6 ft fwd/back x 5 each way with moderate Lt HHA, visual mirror feedback.  Lateral stepping in // bars 6 ft x 3 each with moderate hands on bars.   Manual Percussive device to Rt glute med/min, lateral thigh.      TODAY'S TREATMENT     DATE:10/01/24 Therex: Nustep Lvl 6 12  mins for ROM, aerobic exercise  Neuro Re-ed (balance improvements, coordination) Tandem stance 1 min x 2 bilateral on foam in // bars with occasional HHA on bars., SBA Lateral balance  on foam bar 1 min with SBA Alternating toe tapping while standing on foam with 6 inch step (net approx. 4 inch height) x 12 bilateral occasional HHA on bar, with SBA Step ups on to foam 2x10 with UE bar use occasionally and SBA  TherActivity Leg press double leg 100 lbs x 20  Leg press single leg , performed bilaterally 56 lbs 2 x 15  Lateral step down 4 inch step x 12 bilateral      TODAY'S TREATMENT     DATE: 09/29/2024 Therex: Nustep Lvl 6 12 mins for ROM, aerobic exercise   Neuro Re-ed (balance improvements, coordination) Tandem stance 1 min x 2 bilateral on foam in // bars with occasional HHA on bars., SBA Lateral stepping on foam bar 6 ft x 3 each way with moderate HHA on bar , SBA Alternating toe tapping while standing on foam with 6 inch step (net approx. 4 inch height) x 10 bilateral occasional HHA on bar, with SBA  TherActivity Leg press double leg 100 lbs x 20  Leg press single leg , performed bilaterally  56 lbs 2 x 15  Lateral step down 4 inch step x 10 bilateral    TODAY'S TREATMENT     DATE: 11/3/025 Therex: Nustep Lvl 6 10 mins for ROM, aerobic exercise   Neuro Re-ed (balance improvements, coordination) Tandem stance 1 min x 2 bilateral on foam in // bars with occasional HHA on bars.  Feet together stance across foam bar in // bars eyes open 1 min, head turns x 20 each way with min A at times to prevent LOB.  Step to pattern over 6 inch hurdle in // bars 10 ft x 4 leading with each LE, lateral stepping 10 ft x 3 each way.  SBA.   TherActivity Step up forward 6 inch step with Lt hand lightly on bar x 10 bilateral LE Flight of stairs reciprocal gait going down full fight with supervision.    TODAY'S TREATMENT     DATE: 11/3/025 Therex: Nustep Lvl 6 10 mins for ROM, aerobic  exercise Supine bridge with green band around knees for hip abduction hold 2 x 10 Supine guided Thomas stretch to neutral Rt leg with Lt knee to chest 30 sec x 3    Neuro Re-ed SLS with contralateral leg tapping 3 anteriorly placed cones in // bars with occasional HHA, SBA x 6 each cone, performed bilaterally  Fitter rocker board fwd/back light slow touching in // bars with moderate HHA on bar x 25 each way   TherActivity Leg press double leg 100 lbs x 20  Leg press single leg , performed bilaterally 56 lbs 2 x 15  Step up forward with slow lowering reverse step down 6 inch step in // bars with Lt hand assist on bar     PATIENT EDUCATION:  08/16/2024 Education details: HEP, POC Person educated: Patient Education method: Programmer, Multimedia, Demonstration, Verbal cues, and Handouts Education comprehension: verbalized understanding, returned demonstration, and verbal cues required  HOME EXERCISE PROGRAM: Access Code: [email or web address] URL: [email or web address] Date: 08/16/2024 Prepared by: Ozell Silvan  Exercises - Supine Bridge  - 1-2 x daily - 7 x weekly - 1-2 sets - 10 reps - 2 hold - Seated Quad Set  - 2-3 x daily - 7 x weekly - 1 sets - 10 reps - 5 hold - Clamshell (Mirrored)  - 1-2 x daily - 7 x weekly - 2-3 sets - 10-15 reps - Seated Isometric Knee Extension  - 2-3 x daily - 7 x weekly - 1 sets - 10 reps - 5-10 hold - Retro Step  - 1-2 x daily - 7 x weekly - 1 sets - 10-20 reps - [place]  - 1-2 x daily - 7 x weekly - 1 sets - 1-2 mins hold  ASSESSMENT:  CLINICAL IMPRESSION: Percussive device seemed helpful for hip symptoms.  Ambulation with some mild hip circumduction noted in Rt swing.   OBJECTIVE IMPAIRMENTS: Abnormal gait, decreased activity tolerance, decreased balance, decreased coordination, decreased endurance, decreased mobility, difficulty walking, decreased ROM, decreased strength, increased fascial restrictions, impaired perceived functional ability,  increased muscle spasms, impaired flexibility, improper body mechanics, and pain.   ACTIVITY LIMITATIONS: carrying, lifting, bending, sitting, standing, squatting, sleeping, stairs, transfers, bed mobility, dressing, hygiene/grooming, and locomotion level  PARTICIPATION LIMITATIONS: meal prep, cleaning, laundry, interpersonal relationship, driving, shopping, and community activity  PERSONAL FACTORS: PMH: CVA with Rt hemiparesis, HTN, neurogenic bladder Lt TKA 09/10/2022,  are also affecting patient's functional outcome.   REHAB POTENTIAL: Good  CLINICAL DECISION MAKING: Stable/uncomplicated  EVALUATION COMPLEXITY: Low   GOALS:  Goals reviewed with patient? Yes  SHORT TERM GOALS: (target date for Short term goals are 3 weeks 09/06/2024)   1.  Patient will demonstrate independent use of home exercise program to maintain progress from in clinic treatments.  Goal status: Met  LONG TERM GOALS: (target dates for all long term goals are 10 weeks  10/25/2024 )   1. Patient will demonstrate/report pain at worst less than or equal to 2/10 to facilitate minimal limitation in daily activity secondary to pain symptoms.  Goal status: on going 09/29/2024   2. Patient will demonstrate independent use of home exercise program to facilitate ability to maintain/progress functional gains from skilled physical therapy services.  Goal status: on going 09/29/2024   3. Patient will demonstrate Patient specific functional scale avg > or = 8/10 to indicate reduced disability due to condition.   Goal status: GOAL MET, 09/16/2024   4.  Patient will demonstrate Rt LE MMT 5/5 throughout to faciltiate usual transfers, stairs, squatting at [facility] for daily life.   Goal status: on going 09/29/2024   5.  Patient will demonstrate independent ambulation community distances > 500 ft to facilitate community integration.  Goal status:on going 09/29/2024   6.  Patient will demonstrate TUG < 14 seconds independent  to reduce fall risk.  Goal status: on going 09/29/2024   7.  Patient will demonstrate ascending/descending stairs reciprocally s UE assist for community integration.   Goal Status:on going 09/29/2024   PLAN:  PT FREQUENCY: 1-2x/week  PT DURATION: 10 weeks  PLANNED INTERVENTIONS: Can include 02853- PT Re-evaluation, 97110-Therapeutic exercises, 97530- Therapeutic activity, 97112- Neuromuscular re-education, 97535- Self Care, [id number]- Manual therapy, [phone]- Gait training,[id number]- Electrical stimulation (unattended), 97750 Physical performance testing  Patient/Family education, Balance training, Stair training, Taping, Dry Needling, Joint mobilization, Joint manipulation, Spinal manipulation, Spinal mobilization, Scar mobilization, Vestibular training, Visual/preceptual remediation/compensation, DME instructions, Cryotherapy, and Moist heat.  All performed as medically necessary.  All included unless contraindicated  PLAN FOR NEXT SESSION:  Percussive device if desired.   Ozell Silvan, PT, DPT, OCS, ATC 10/04/24  4:24 PM       Referring diagnosis? S03.358 (ICD-10-CM) - Status post total replacement of right hip Treatment diagnosis? (if different than referring diagnosis) M25.551, M62.81, R 26.2 What was this (referring dx) caused by? [x]  Surgery []  Fall []  Ongoing issue []  Arthritis []  Other: ____________  Laterality: [x]  Rt []  Lt []  Both  Check all possible CPT codes:  *CHOOSE 10 OR LESS*    See Planned Interventions listed in the Plan section of the Evaluation.

## 2024-10-06 ENCOUNTER — Encounter: Payer: Self-pay | Admitting: Rehabilitative and Restorative Service Providers"

## 2024-10-06 ENCOUNTER — Ambulatory Visit: Admitting: Rehabilitative and Restorative Service Providers"

## 2024-10-06 DIAGNOSIS — R262 Difficulty in walking, not elsewhere classified: Secondary | ICD-10-CM | POA: Diagnosis not present

## 2024-10-06 DIAGNOSIS — M6281 Muscle weakness (generalized): Secondary | ICD-10-CM

## 2024-10-06 DIAGNOSIS — M25551 Pain in right hip: Secondary | ICD-10-CM

## 2024-10-06 NOTE — Therapy (Signed)
 OUTPATIENT PHYSICAL THERAPY TREATMENT  Patient Name: Teresa Martinez MRN: [id number] DOB:02-Oct-1941, 83 y.o., female Today's Date: 10/06/2024   END OF SESSION:  PT End of Session - 10/06/24 1022     Visit Number 16    Number of Visits 20    Date for Recertification  10/25/24    Authorization Type [employer] $20 copay    Authorization - Visit Number 6    Authorization - Number of Visits 10    Progress Note Due on Visit 19    PT Start Time 1015    PT Stop Time 1054    PT Time Calculation (min) 39 min    Activity Tolerance Patient tolerated treatment well    Behavior During Therapy WFL for tasks assessed/performed                Past Medical History:  Diagnosis Date   Arthritis    Cervical spine degeneration    C5/C6   Chronic right shoulder pain    GERD (gastroesophageal reflux disease)    Hemiparesis affecting dominant side as late effect of cerebrovascular accident (HCC) 12/07/2014   Hyperlipemia    Hypertension    Menopausal syndrome    [name]   Overactive bladder    Statin intolerance    Stroke (HCC) 2015   weakness on right side   Varicose vein of leg    left leg   Past Surgical History:  Procedure Laterality Date   ABDOMINAL HYSTERECTOMY     1985   BAND HEMORRHOIDECTOMY     2010   CATARACT EXTRACTION, BILATERAL     CHOLECYSTECTOMY     1950   LUMBAR LAMINECTOMY Bilateral 08/03/2018   Dr. Wallene, L3 laminotomy, L4-L5 laminectomy with neural foraminal decompression.   PAROTIDECTOMY     30 years ago   TOTAL HIP ARTHROPLASTY Right 07/30/2024   Procedure: ARTHROPLASTY, HIP, TOTAL, ANTERIOR APPROACH;  Surgeon: Vernetta Lonni GRADE, MD;  Location: [facility];  Service: Orthopedics;  Laterality: Right;   TOTAL KNEE ARTHROPLASTY Left 09/10/2022   Procedure: LEFT TOTAL KNEE ARTHROPLASTY;  Surgeon: Vernetta Lonni GRADE, MD;  Location: [facility];  Service: Orthopedics;  Laterality: Left;   Patient Active Problem List   Diagnosis Date Noted    Status post total replacement of right hip 07/30/2024   Status post left knee replacement 09/10/2022   IGT (impaired glucose tolerance) 07/27/2021   Preop testing 07/09/2018   Degenerative lumbar spinal stenosis 05/12/2018   Statin myopathy 08/29/2016   Iliotibial band syndrome of right side 04/23/2016   Acromioclavicular arthrosis 06/30/2015   Adhesive capsulitis of right shoulder 02/06/2015   Dysarthria due to cerebrovascular accident 01/09/2015   Spastic neurogenic bladder 10/31/2014   Right rotator cuff tendonitis 10/19/2014   Stenosis of cervical spine region    Cerebral infarction due to thrombosis of left middle cerebral artery (HCC)    Left pontine stroke (HCC)    TIA (transient ischemic attack) 10/14/2014   Overactive bladder 10/14/2014   H/O: CVA (cerebrovascular accident) 10/14/2014   Essential hypertension 10/10/2009   INSOMNIA 09/15/2009   Pure hypercholesterolemia 11/28/2008   MENOPAUSAL SYNDROME 11/28/2008    PCP: Theophilus Andrews MD  REFERRING PROVIDER: Vernetta Lonni GRADE, MD  REFERRING DIAG: [phone] (ICD-10-CM) - Status post total replacement of right hip  THERAPY DIAG:  Pain in right hip  Muscle weakness (generalized)  Difficulty in walking, not elsewhere classified  Rationale for Evaluation and Treatment: Rehabilitation  ONSET DATE: 07/30/2024 surgery Rt THA  SUBJECTIVE:  SUBJECTIVE STATEMENT: Pt indicated feeling less soreness than last time.  Reported touching causes 2-3/10.  Thought the massage gun helped.   PERTINENT HISTORY: PMH: CVA with Rt hemiparesis, HTN, neurogenic bladder.  Lt TKA 09/10/2022   PAIN:  NPRS scale: no specific pain upon arrival.  Pain location: anterior, posterior/lateral, even into thigh.  Pain description: soreness Aggravating factors: lying on Rt side, WB pressure.  Relieving factors: sit/rest. OTC medicine.   PRECAUTIONS: Anterior hip  WEIGHT BEARING RESTRICTIONS: No  FALLS:  Has patient fallen in  last 6 months? No  LIVING ENVIRONMENT: Lives with: primary alone but has children staying at this time.  Lives in: House/apartment Stairs:5 to enter at front, 2 in back with handrail on Lt  Has following equipment at home:FWW, SPC  OCCUPATION: Retired  PLOF: Independent, hobbies - read, exercise routine   PATIENT GOALS: walk, drive  OBJECTIVE:   PATIENT SURVEYS:  Patient-Specific Activity Scoring Scheme  0 represents "unable to perform." 10 represents "able to perform at prior level. 0 1 2 3 4 5 6 7 8 9  10 (Date and Score)   Activity Eval  08/16/2024  09/06/2024 09/16/2024   1. Driving  0 8  10  2. Walking  4 8  8   3. Cooking 2 10 10   4. laundry 2 7 10   5. House cleaning 5 5 7   Score 2.6avg 7.6 9   Total score = sum of the activity scores/number of activities Minimum detectable change (90%CI) for average score = 2 points Minimum detectable change (90%CI) for single activity score = 3 points  COGNITION: 08/16/2024 Overall cognitive status: WFL    SENSATION: 08/16/2024 No impairment observed in dermatomes.   MUSCLE LENGTH: 08/16/2024 No specific testing.   POSTURE:  08/16/2024 Mild weight shift off Rt leg to Lt in standing.   PALPATION: 10/04/2024:  Trigger points Rt glute med/min, lateral quad.  Improved after massage gun.   08/16/2024 Tenderness to light touch, trigger points/muscle tenderness in Rt glute max, med, min, anterior/lateral quad Rt.   LOWER EXTREMITY ROM:  08/16/2024: Held formalized ROM testing today   ROM Right Eval 08/16/2024 Left Eval 08/16/2024  Hip flexion    Hip extension    Hip abduction    Hip adduction    Hip internal rotation    Hip external rotation    Knee flexion    Knee extension    Ankle dorsiflexion    Ankle plantarflexion    Ankle inversion    Ankle eversion     (Blank rows = not tested)  LOWER EXTREMITY MMT:  MMT Right Eval 08/16/2024 Left Eval 08/16/2024 Right 08/30/2024 10/06/2024  Hip flexion 4/5  5/5 5/5   Hip extension      Hip abduction 3+/5   4+/5  Hip adduction      Hip internal rotation      Hip external rotation      Knee flexion 5/5 5/5 5/5   Knee extension 4/5 5/5 5/5   Ankle dorsiflexion 4+/5 5/5 5/5   Ankle plantarflexion      Ankle inversion      Ankle eversion       (Blank rows = not tested)  LOWER EXTREMITY SPECIAL TESTS:  08/16/2024 No   FUNCTIONAL TESTS:  09/29/2024: Able to perform step up 6 inch step with each LE with minimal UE assist.  Eccentric step down control lacking with both legs.    09/16/2024 6 minute walk test: no AD for 836'3 5x STS: 12.57s  no UE use from 18 arm chair   09/13/2024: TUG with SPC: 14.2 seconds TUG independently : 12.20 seconds 18 inch chair transfer : 1st try without UE assist.   08/25/2024:  TUG with SPC : 20 seconds.   08/16/2024 18 inch chair transfer: unable s UE assist  Lt SLS: not tested  Rt SLS: unable  TUG with FWW;  21.62 seconds  GAIT: 09/06/2024: Ambulation with SPC to clinic.  Able to do short distances in clinic independent.   08/16/2024 Household distances in clinic with FWW with step through gait pattern.                                                                                                                                                                         TODAY'S TREATMENT     DATE:10/06/24 Manual Percussive device to Rt glute med/min, lateral thigh.   Therex: Nustep Lvl 6 10.5 mins for ROM, aerobic exercise.  Verbal review of some established HEP.  Will review and update next visit.   TherActivity Leg press double leg 112 lbs 2 x 15  Leg press single leg , performed bilaterally 56 lbs 2 x 15  Lateral step down 4 inch step x 15 bilateral with slow lowering focus.  Light hand rail assist for balance.  Flight of stairs up/down with reciprocal gait pattern with single hand on rail.  Cues for improved Rt hip flexion, knee flexion in stepping up to limit circumduction.     TODAY'S TREATMENT     DATE:10/01/24 Therex: Nustep Lvl 6 10.5 mins for ROM, aerobic exercise.  Lt sidelying , Rt hip clam shells 3 x 10  Supine hooklying bridge with green band around knees hip abduction hold out during, 2 x 10 Incline gastroc stretch 30 sec x 3 bilateral   Neuro Re-ed Tandem stance on foam 1 min x 2 bilaterally with occasional HHA on // bars Feet together stance across foam bar 1 min  Tandem ambulation on foam in // bars 6 ft fwd/back x 5 each way with moderate Lt HHA, visual mirror feedback.  Lateral stepping in // bars 6 ft x 3 each with moderate hands on bars.   Manual Percussive device to Rt glute med/min, lateral thigh.      TODAY'S TREATMENT     DATE:10/01/24 Therex: Nustep Lvl 6 12 mins for ROM, aerobic exercise  Neuro Re-ed (balance improvements, coordination) Tandem stance 1 min x 2 bilateral on foam in // bars with occasional HHA on bars., SBA Lateral balance  on foam bar 1 min with SBA Alternating toe tapping while standing on foam with 6 inch step (net approx. 4 inch height) x 12 bilateral occasional HHA on bar, with SBA Step ups on  to foam 2x10 with UE bar use occasionally and SBA  TherActivity Leg press double leg 100 lbs x 20  Leg press single leg , performed bilaterally 56 lbs 2 x 15  Lateral step down 4 inch step x 12 bilateral    TODAY'S TREATMENT     DATE: 09/29/2024 Therex: Nustep Lvl 6 12 mins for ROM, aerobic exercise   Neuro Re-ed (balance improvements, coordination) Tandem stance 1 min x 2 bilateral on foam in // bars with occasional HHA on bars., SBA Lateral stepping on foam bar 6 ft x 3 each way with moderate HHA on bar , SBA Alternating toe tapping while standing on foam with 6 inch step (net approx. 4 inch height) x 10 bilateral occasional HHA on bar, with SBA  TherActivity Leg press double leg 100 lbs x 20  Leg press single leg , performed bilaterally 56 lbs 2 x 15  Lateral step down 4 inch step x 10 bilateral     TODAY'S TREATMENT     DATE: 11/3/025 Therex: Nustep Lvl 6 10 mins for ROM, aerobic exercise   Neuro Re-ed (balance improvements, coordination) Tandem stance 1 min x 2 bilateral on foam in // bars with occasional HHA on bars.  Feet together stance across foam bar in // bars eyes open 1 min, head turns x 20 each way with min A at times to prevent LOB.  Step to pattern over 6 inch hurdle in // bars 10 ft x 4 leading with each LE, lateral stepping 10 ft x 3 each way.  SBA.   TherActivity Step up forward 6 inch step with Lt hand lightly on bar x 10 bilateral LE Flight of stairs reciprocal gait going down full fight with supervision.    PATIENT EDUCATION:  08/16/2024 Education details: HEP, POC Person educated: Patient Education method: Programmer, Multimedia, Demonstration, Verbal cues, and Handouts Education comprehension: verbalized understanding, returned demonstration, and verbal cues required  HOME EXERCISE PROGRAM: Access Code: [email or web address] URL: [email or web address] Date: 08/16/2024 Prepared by: Ozell Silvan  Exercises - Supine Bridge  - 1-2 x daily - 7 x weekly - 1-2 sets - 10 reps - 2 hold - Seated Quad Set  - 2-3 x daily - 7 x weekly - 1 sets - 10 reps - 5 hold - Clamshell (Mirrored)  - 1-2 x daily - 7 x weekly - 2-3 sets - 10-15 reps - Seated Isometric Knee Extension  - 2-3 x daily - 7 x weekly - 1 sets - 10 reps - 5-10 hold - Retro Step  - 1-2 x daily - 7 x weekly - 1 sets - 10-20 reps - [place]  - 1-2 x daily - 7 x weekly - 1 sets - 1-2 mins hold  ASSESSMENT:  CLINICAL IMPRESSION: Massage gun use did help with soreness symptoms.  Still present but reduced per report.  Hip strength continued to improve overall.    OBJECTIVE IMPAIRMENTS: Abnormal gait, decreased activity tolerance, decreased balance, decreased coordination, decreased endurance, decreased mobility, difficulty walking, decreased ROM, decreased strength, increased fascial restrictions, impaired  perceived functional ability, increased muscle spasms, impaired flexibility, improper body mechanics, and pain.   ACTIVITY LIMITATIONS: carrying, lifting, bending, sitting, standing, squatting, sleeping, stairs, transfers, bed mobility, dressing, hygiene/grooming, and locomotion level  PARTICIPATION LIMITATIONS: meal prep, cleaning, laundry, interpersonal relationship, driving, shopping, and community activity  PERSONAL FACTORS: PMH: CVA with Rt hemiparesis, HTN, neurogenic bladder Lt TKA 09/10/2022,  are also affecting patient's functional outcome.   REHAB POTENTIAL: Good  CLINICAL DECISION  MAKING: Stable/uncomplicated  EVALUATION COMPLEXITY: Low   GOALS: Goals reviewed with patient? Yes  SHORT TERM GOALS: (target date for Short term goals are 3 weeks 09/06/2024)   1.  Patient will demonstrate independent use of home exercise program to maintain progress from in clinic treatments.  Goal status: Met  LONG TERM GOALS: (target dates for all long term goals are 10 weeks  10/25/2024 )   1. Patient will demonstrate/report pain at worst less than or equal to 2/10 to facilitate minimal limitation in daily activity secondary to pain symptoms.  Goal status: on going 09/29/2024   2. Patient will demonstrate independent use of home exercise program to facilitate ability to maintain/progress functional gains from skilled physical therapy services.  Goal status: on going 09/29/2024   3. Patient will demonstrate Patient specific functional scale avg > or = 8/10 to indicate reduced disability due to condition.   Goal status: GOAL MET, 09/16/2024   4.  Patient will demonstrate Rt LE MMT 5/5 throughout to faciltiate usual transfers, stairs, squatting at [facility] for daily life.   Goal status: on going 09/29/2024   5.  Patient will demonstrate independent ambulation community distances > 500 ft to facilitate community integration.  Goal status:on going 09/29/2024   6.  Patient will demonstrate  TUG < 14 seconds independent to reduce fall risk.  Goal status: on going 09/29/2024   7.  Patient will demonstrate ascending/descending stairs reciprocally s UE assist for community integration.   Goal Status:on going 09/29/2024   PLAN:  PT FREQUENCY: 1-2x/week  PT DURATION: 10 weeks  PLANNED INTERVENTIONS: Can include 02853- PT Re-evaluation, 97110-Therapeutic exercises, 97530- Therapeutic activity, 97112- Neuromuscular re-education, 97535- Self Care, [id number]- Manual therapy, [phone]- Gait training,[id number]- Electrical stimulation (unattended), 97750 Physical performance testing  Patient/Family education, Balance training, Stair training, Taping, Dry Needling, Joint mobilization, Joint manipulation, Spinal manipulation, Spinal mobilization, Scar mobilization, Vestibular training, Visual/preceptual remediation/compensation, DME instructions, Cryotherapy, and Moist heat.  All performed as medically necessary.  All included unless contraindicated  PLAN FOR NEXT SESSION:  Possible HEP trial upcoming.  Review HEP.   Ozell Silvan, PT, DPT, OCS, ATC 10/06/24  10:54 AM       Referring diagnosis? S03.358 (ICD-10-CM) - Status post total replacement of right hip Treatment diagnosis? (if different than referring diagnosis) M25.551, M62.81, R 26.2 What was this (referring dx) caused by? [x]  Surgery []  Fall []  Ongoing issue []  Arthritis []  Other: ____________  Laterality: [x]  Rt []  Lt []  Both  Check all possible CPT codes:  *CHOOSE 10 OR LESS*    See Planned Interventions listed in the Plan section of the Evaluation.

## 2024-10-13 ENCOUNTER — Ambulatory Visit: Admitting: Rehabilitative and Restorative Service Providers"

## 2024-10-13 ENCOUNTER — Encounter: Payer: Self-pay | Admitting: Rehabilitative and Restorative Service Providers"

## 2024-10-13 DIAGNOSIS — M25551 Pain in right hip: Secondary | ICD-10-CM | POA: Diagnosis not present

## 2024-10-13 DIAGNOSIS — R262 Difficulty in walking, not elsewhere classified: Secondary | ICD-10-CM | POA: Diagnosis not present

## 2024-10-13 DIAGNOSIS — M6281 Muscle weakness (generalized): Secondary | ICD-10-CM

## 2024-10-13 NOTE — Therapy (Signed)
 OUTPATIENT PHYSICAL THERAPY TREATMENT / DISCHARGE  Patient Name: Teresa Martinez MRN: [id number] DOB:[birth year], 83 y.o., female Today's Date: 10/13/2024  PHYSICAL THERAPY DISCHARGE SUMMARY  Visits from Start of Care: 17  Current functional level related to goals / functional outcomes: See note   Remaining deficits: See note   Education / Equipment: HEP  Patient goals were met. Patient is being discharged due to being pleased with the current functional level.    END OF SESSION:  PT End of Session - 10/13/24 1429     Visit Number 17    Number of Visits 20    Date for Recertification  10/25/24    Authorization Type [employer] $20 copay    Authorization - Visit Number 7    Authorization - Number of Visits 10    Progress Note Due on Visit 19    PT Start Time 1418    PT Stop Time 1457    PT Time Calculation (min) 39 min    Activity Tolerance Patient tolerated treatment well    Behavior During Therapy WFL for tasks assessed/performed                 Past Medical History:  Diagnosis Date   Arthritis    Cervical spine degeneration    C5/C6   Chronic right shoulder pain    GERD (gastroesophageal reflux disease)    Hemiparesis affecting dominant side as late effect of cerebrovascular accident (HCC) 12/07/2014   Hyperlipemia    Hypertension    Menopausal syndrome    [name]   Overactive bladder    Statin intolerance    Stroke (HCC) 2015   weakness on right side   Varicose vein of leg    left leg   Past Surgical History:  Procedure Laterality Date   ABDOMINAL HYSTERECTOMY     1985   BAND HEMORRHOIDECTOMY     2010   CATARACT EXTRACTION, BILATERAL     CHOLECYSTECTOMY     1950   LUMBAR LAMINECTOMY Bilateral 08/03/2018   Dr. Wallene, L3 laminotomy, L4-L5 laminectomy with neural foraminal decompression.   PAROTIDECTOMY     30 years ago   TOTAL HIP ARTHROPLASTY Right 07/30/2024   Procedure: ARTHROPLASTY, HIP, TOTAL, ANTERIOR APPROACH;   Surgeon: Vernetta Lonni GRADE, MD;  Location: [facility];  Service: Orthopedics;  Laterality: Right;   TOTAL KNEE ARTHROPLASTY Left 09/10/2022   Procedure: LEFT TOTAL KNEE ARTHROPLASTY;  Surgeon: Vernetta Lonni GRADE, MD;  Location: [facility];  Service: Orthopedics;  Laterality: Left;   Patient Active Problem List   Diagnosis Date Noted   Status post total replacement of right hip 07/30/2024   Status post left knee replacement 09/10/2022   IGT (impaired glucose tolerance) 07/27/2021   Preop testing 07/09/2018   Degenerative lumbar spinal stenosis 05/12/2018   Statin myopathy 08/29/2016   Iliotibial band syndrome of right side 04/23/2016   Acromioclavicular arthrosis 06/30/2015   Adhesive capsulitis of right shoulder 02/06/2015   Dysarthria due to cerebrovascular accident 01/09/2015   Spastic neurogenic bladder 10/31/2014   Right rotator cuff tendonitis 10/19/2014   Stenosis of cervical spine region    Cerebral infarction due to thrombosis of left middle cerebral artery (HCC)    Left pontine stroke (HCC)    TIA (transient ischemic attack) 10/14/2014   Overactive bladder 10/14/2014   H/O: CVA (cerebrovascular accident) 10/14/2014   Essential hypertension 10/10/2009   INSOMNIA 09/15/2009   Pure hypercholesterolemia 11/28/2008   MENOPAUSAL SYNDROME 11/28/2008    PCP: Theophilus Andrews  MD  REFERRING PROVIDER: Vernetta Lonni GRADE, MD  REFERRING DIAG: [phone] (ICD-10-CM) - Status post total replacement of right hip  THERAPY DIAG:  Pain in right hip  Muscle weakness (generalized)  Difficulty in walking, not elsewhere classified  Rationale for Evaluation and Treatment: Rehabilitation  ONSET DATE: 07/30/2024 surgery Rt THA  SUBJECTIVE:   SUBJECTIVE STATEMENT: Pt indicated overall improvement to normal around 80% and felt comfortable with HEP plan.   PERTINENT HISTORY: PMH: CVA with Rt hemiparesis, HTN, neurogenic bladder.  Lt TKA 09/10/2022   PAIN:  NPRS scale: no  specific pain upon arrival.  Pain location: anterior, posterior/lateral, even into thigh.  Pain description: soreness Aggravating factors: lying on Rt side, WB pressure.  Relieving factors: sit/rest. OTC medicine.   PRECAUTIONS: Anterior hip  WEIGHT BEARING RESTRICTIONS: No  FALLS:  Has patient fallen in last 6 months? No  LIVING ENVIRONMENT: Lives with: primary alone but has children staying at this time.  Lives in: House/apartment Stairs:5 to enter at front, 2 in back with handrail on Lt  Has following equipment at home:FWW, SPC  OCCUPATION: Retired  PLOF: Independent, hobbies - read, exercise routine   PATIENT GOALS: walk, drive  OBJECTIVE:   PATIENT SURVEYS:  Patient-Specific Activity Scoring Scheme  0 represents "unable to perform." 10 represents "able to perform at prior level. 0 1 2 3 4 5 6 7 8 9  10 (Date and Score)   Activity Eval  08/16/2024  09/06/2024 09/16/2024  10/13/2024  1. Driving  0 8  10 10   2. Walking  4 8  8 8   3. Cooking 2 10 10 10   4. laundry 2 7 10 10   5. House cleaning 5 5 7 8   Score 2.6avg 7.6 9 9.2 avg   Total score = sum of the activity scores/number of activities Minimum detectable change (90%CI) for average score = 2 points Minimum detectable change (90%CI) for single activity score = 3 points  COGNITION: 08/16/2024 Overall cognitive status: WFL    SENSATION: 08/16/2024 No impairment observed in dermatomes.   MUSCLE LENGTH: 08/16/2024 No specific testing.   POSTURE:  08/16/2024 Mild weight shift off Rt leg to Lt in standing.   PALPATION: 10/04/2024:  Trigger points Rt glute med/min, lateral quad.  Improved after massage gun.   08/16/2024 Tenderness to light touch, trigger points/muscle tenderness in Rt glute max, med, min, anterior/lateral quad Rt.   LOWER EXTREMITY ROM:  08/16/2024: Held formalized ROM testing today   ROM Right Eval 08/16/2024 Left Eval 08/16/2024  Hip flexion    Hip extension    Hip abduction     Hip adduction    Hip internal rotation    Hip external rotation    Knee flexion    Knee extension    Ankle dorsiflexion    Ankle plantarflexion    Ankle inversion    Ankle eversion     (Blank rows = not tested)  LOWER EXTREMITY MMT:  MMT Right Eval 08/16/2024 Left Eval 08/16/2024 Right 08/30/2024 10/06/2024 Right Right 10/13/2024 Left 10/13/2024  Hip flexion 4/5 5/5 5/5  5/5 5/5  Hip extension        Hip abduction 3+/5   4+/5 5/5   Hip adduction        Hip internal rotation        Hip external rotation        Knee flexion 5/5 5/5 5/5  5/5   Knee extension 4/5 5/5 5/5  5/5   Ankle dorsiflexion 4+/5 5/5 5/5  Ankle plantarflexion        Ankle inversion        Ankle eversion         (Blank rows = not tested)  LOWER EXTREMITY SPECIAL TESTS:  08/16/2024 No   FUNCTIONAL TESTS:    09/29/2024: Able to perform step up 6 inch step with each LE with minimal UE assist.  Eccentric step down control lacking with both legs.   09/16/2024 6 minute walk test: no AD for 836'3 5x STS: 12.57s no UE use from 18 arm chair   09/13/2024: TUG with SPC: 14.2 seconds TUG independently : 12.20 seconds 18 inch chair transfer : 1st try without UE assist.   08/25/2024:  TUG with SPC : 20 seconds.   08/16/2024 18 inch chair transfer: unable s UE assist  Lt SLS: not tested  Rt SLS: unable  TUG with FWW;  21.62 seconds  GAIT: 09/06/2024: Ambulation with SPC to clinic.  Able to do short distances in clinic independent.   08/16/2024 Household distances in clinic with FWW with step through gait pattern.                                                                                                                                                                         TODAY'S TREATMENT     DATE: 10/13/2024 Manual Percussive device to Rt glute med/min, lateral thigh.    Therex: Supine bridge 2-3 sec hold 2 x 10  Sidelying clam shell Rt hip 2 x 15  Seated quad set 5 sec hold  x 10 Rt leg Seated quad set with SLR 2 x 10 bilaterally Sit to stand to sit 18 inch table height no UE assist x 10  Review of HEP and handout updated for home use.      TODAY'S TREATMENT     DATE:10/06/24 Manual Percussive device to Rt glute med/min, lateral thigh.   Therex: Nustep Lvl 6 10.5 mins for ROM, aerobic exercise.  Verbal review of some established HEP.  Will review and update next visit.   TherActivity Leg press double leg 112 lbs 2 x 15  Leg press single leg , performed bilaterally 56 lbs 2 x 15  Lateral step down 4 inch step x 15 bilateral with slow lowering focus.  Light hand rail assist for balance.  Flight of stairs up/down with reciprocal gait pattern with single hand on rail.  Cues for improved Rt hip flexion, knee flexion in stepping up to limit circumduction.    TODAY'S TREATMENT     DATE:10/01/24 Therex: Nustep Lvl 6 10.5 mins for ROM, aerobic exercise.  Lt sidelying , Rt hip clam shells 3 x 10  Supine hooklying bridge with green band around knees hip abduction  hold out during, 2 x 10 Incline gastroc stretch 30 sec x 3 bilateral   Neuro Re-ed Tandem stance on foam 1 min x 2 bilaterally with occasional HHA on // bars Feet together stance across foam bar 1 min  Tandem ambulation on foam in // bars 6 ft fwd/back x 5 each way with moderate Lt HHA, visual mirror feedback.  Lateral stepping in // bars 6 ft x 3 each with moderate hands on bars.   Manual Percussive device to Rt glute med/min, lateral thigh.      TODAY'S TREATMENT     DATE:10/01/24 Therex: Nustep Lvl 6 12 mins for ROM, aerobic exercise  Neuro Re-ed (balance improvements, coordination) Tandem stance 1 min x 2 bilateral on foam in // bars with occasional HHA on bars., SBA Lateral balance  on foam bar 1 min with SBA Alternating toe tapping while standing on foam with 6 inch step (net approx. 4 inch height) x 12 bilateral occasional HHA on bar, with SBA Step ups on to foam 2x10 with UE bar  use occasionally and SBA  TherActivity Leg press double leg 100 lbs x 20  Leg press single leg , performed bilaterally 56 lbs 2 x 15  Lateral step down 4 inch step x 12 bilateral    TODAY'S TREATMENT     DATE: 09/29/2024 Therex: Nustep Lvl 6 12 mins for ROM, aerobic exercise   Neuro Re-ed (balance improvements, coordination) Tandem stance 1 min x 2 bilateral on foam in // bars with occasional HHA on bars., SBA Lateral stepping on foam bar 6 ft x 3 each way with moderate HHA on bar , SBA Alternating toe tapping while standing on foam with 6 inch step (net approx. 4 inch height) x 10 bilateral occasional HHA on bar, with SBA  TherActivity Leg press double leg 100 lbs x 20  Leg press single leg , performed bilaterally 56 lbs 2 x 15  Lateral step down 4 inch step x 10 bilateral    PATIENT EDUCATION:  08/16/2024 Education details: HEP, POC Person educated: Patient Education method: Programmer, Multimedia, Demonstration, Verbal cues, and Handouts Education comprehension: verbalized understanding, returned demonstration, and verbal cues required  HOME EXERCISE PROGRAM: Access Code: [email or web address] URL: [email or web address] Date: 10/13/2024 Prepared by: Ozell Silvan  Exercises - Supine Bridge  - 1-2 x daily - 7 x weekly - 1-2 sets - 10 reps - 2 hold - Seated Quad Set  - 2-3 x daily - 7 x weekly - 1 sets - 10 reps - 5 hold - Clamshell (Mirrored)  - 1-2 x daily - 7 x weekly - 2-3 sets - 10-15 reps - Retro Step  - 1-2 x daily - 7 x weekly - 1 sets - 10-20 reps - Seated Straight Leg Raise   - 1-2 x daily - 7 x weekly - 2-3 sets - 8-15 reps - Tandem Stance in Corner  - 1-2 x daily - 7 x weekly - 1 sets - 3-5 reps - 30 hold - Sit to Stand  - 3 x daily - 7 x weekly - 1 sets - 10 reps  ASSESSMENT:  CLINICAL IMPRESSION: The patient has attended 17 visits over the course of treatment cycle.  Patient has reported overall improvement at 80%.  See objective data above for updated  information regarding current presentation.  Improvements in strength and mobility have been noted with still some room for functional gains with continued HEP.  Pt in agreement with plan for discharge to HEP at this time  for continued work.    OBJECTIVE IMPAIRMENTS: Abnormal gait, decreased activity tolerance, decreased balance, decreased coordination, decreased endurance, decreased mobility, difficulty walking, decreased ROM, decreased strength, increased fascial restrictions, impaired perceived functional ability, increased muscle spasms, impaired flexibility, improper body mechanics, and pain.   ACTIVITY LIMITATIONS: carrying, lifting, bending, sitting, standing, squatting, sleeping, stairs, transfers, bed mobility, dressing, hygiene/grooming, and locomotion level  PARTICIPATION LIMITATIONS: meal prep, cleaning, laundry, interpersonal relationship, driving, shopping, and community activity  PERSONAL FACTORS: PMH: CVA with Rt hemiparesis, HTN, neurogenic bladder Lt TKA 09/10/2022,  are also affecting patient's functional outcome.   REHAB POTENTIAL: Good  CLINICAL DECISION MAKING: Stable/uncomplicated  EVALUATION COMPLEXITY: Low   GOALS: Goals reviewed with patient? Yes  SHORT TERM GOALS: (target date for Short term goals are 3 weeks 09/06/2024)   1.  Patient will demonstrate independent use of home exercise program to maintain progress from in clinic treatments.  Goal status: Met  LONG TERM GOALS: (target dates for all long term goals are 10 weeks  10/25/2024 )   1. Patient will demonstrate/report pain at worst less than or equal to 2/10 to facilitate minimal limitation in daily activity secondary to pain symptoms.  Goal status: Met 10/13/2024   2. Patient will demonstrate independent use of home exercise program to facilitate ability to maintain/progress functional gains from skilled physical therapy services.  Goal status: Met 10/13/2024   3. Patient will demonstrate Patient  specific functional scale avg > or = 8/10 to indicate reduced disability due to condition.   Goal status: GOAL MET, 09/16/2024   4.  Patient will demonstrate Rt LE MMT 5/5 throughout to faciltiate usual transfers, stairs, squatting at [facility] for daily life.   Goal status: Met 10/13/2024   5.  Patient will demonstrate independent ambulation community distances > 500 ft to facilitate community integration.  Goal status:  Mostly met  10/13/2024   6.  Patient will demonstrate TUG < 14 seconds independent to reduce fall risk.  Goal status: Met 10/13/2024   7.  Patient will demonstrate ascending/descending stairs reciprocally s UE assist for community integration.   Goal Status:Met 10/13/2024   PLAN:  PT FREQUENCY: 1-2x/week  PT DURATION: 10 weeks  PLANNED INTERVENTIONS: Can include 02853- PT Re-evaluation, 97110-Therapeutic exercises, 97530- Therapeutic activity, 97112- Neuromuscular re-education, 97535- Self Care, 97140- Manual therapy, [phone]- Gait training,[id number]- Electrical stimulation (unattended), 97750 Physical performance testing  Patient/Family education, Balance training, Stair training, Taping, Dry Needling, Joint mobilization, Joint manipulation, Spinal manipulation, Spinal mobilization, Scar mobilization, Vestibular training, Visual/preceptual remediation/compensation, DME instructions, Cryotherapy, and Moist heat.  All performed as medically necessary.  All included unless contraindicated  PLAN FOR NEXT SESSION: discharge to HEP.   Ozell Silvan, PT, DPT, OCS, ATC 10/13/24  2:56 PM       Referring diagnosis? S03.358 (ICD-10-CM) - Status post total replacement of right hip Treatment diagnosis? (if different than referring diagnosis) M25.551, M62.81, R 26.2 What was this (referring dx) caused by? [x]  Surgery []  Fall []  Ongoing issue []  Arthritis []  Other: ____________  Laterality: [x]  Rt []  Lt []  Both  Check all possible CPT codes:  *CHOOSE 10 OR LESS*    See  Planned Interventions listed in the Plan section of the Evaluation.

## 2024-10-27 ENCOUNTER — Ambulatory Visit: Admitting: Physician Assistant

## 2024-10-27 ENCOUNTER — Encounter: Payer: Self-pay | Admitting: Physician Assistant

## 2024-10-27 ENCOUNTER — Other Ambulatory Visit (INDEPENDENT_AMBULATORY_CARE_PROVIDER_SITE_OTHER)

## 2024-10-27 DIAGNOSIS — M79671 Pain in right foot: Secondary | ICD-10-CM

## 2024-10-27 NOTE — Progress Notes (Signed)
 Office Visit Note   Patient: Teresa Martinez           Date of Birth: 10/28/1941           MRN: [id number] Visit Date: 10/27/2024              Requested by: Teresa Martinez, Tully GRADE, MD [address] [place],  [place] [address] PCP: Teresa Martinez, Tully GRADE, MD  No chief complaint on file.     HPI: Patient is a pleasant 83 year old woman who is a patient of Dr. Damian.  She is status post left total knee arthroplasty and in September underwent a right total hip arthroplasty.  Overall she has been doing well and she feels her hip is improving every day.  On Sunday she was quite busy doing a bit of walking and errands and when she got home she took a step and had a sudden sharp pain in her foot.  It was significant enough that she could not place weight on her foot.  Since then she has been icing it.  She is now able to bear weight and has much less pain.  She said the only thing she is concerned of is she still has some swelling on the  outside of her foot on the top of her foot  Assessment & Plan: Visit Diagnoses:  1. Pain in right foot     Plan: X-rays does demonstrate degenerative changes but I cannot appreciate a acute fracture.  She has no pain with manipulation of the Lisfranc joint she has good dorsiflexion plantarflexion eversion inversion strength no tenderness over the peroneals.  She has a little little swelling over the dorsal lateral aspect of her foot but no real pain to palpation no ecchymosis no plantar ecchymosis.  I do not see any red flags at this time.  Especially since she is gotten significantly better.  Her compartments are soft and nontender I am not worried about DVT.  I did ask that she try to wear shoes that are more stiff and more supportive than the ones she is currently wearing today especially while she is recovering from this a course if she has this happen again I be happy to see her  Follow-Up Instructions: Return if symptoms worsen or  fail to improve.   Ortho Exam  Patient is alert, oriented, no adenopathy, well-dressed, normal affect, normal respiratory effort. Examination of her right foot she has mild soft tissue swelling over the dorsal lateral aspect of her foot but no ecchymosis.  She has palpable pulses she is able to move all of her toes she has good strength and motion with dorsiflexion plantarflexion eversion inversion compartments are soft and compressible.  Pulses are intact    Imaging: XR Foot Complete Right Result Date: 10/27/2024 Radiographs of her right foot were reviewed no acute fracture she does have degenerative changes.  She has an os peroneum.  No images are attached to the encounter.  Labs: Lab Results  Component Value Date   HGBA1C 5.8 (A) 09/28/2024   HGBA1C 5.9 09/10/2023   HGBA1C 5.6 05/29/2023   ESRSEDRATE 17 04/28/2018   ESRSEDRATE 3 03/12/2017   ESRSEDRATE 8 02/17/2017   CRP <0.8 03/12/2017     Lab Results  Component Value Date   ALBUMIN 4.3 03/24/2024   ALBUMIN 4.3 09/10/2023   ALBUMIN 4.2 07/30/2022    Lab Results  Component Value Date   MG 2.3 01/24/2022   Lab Results  Component Value Date  VD25OH 42.65 09/10/2023   VD25OH 53.86 07/30/2022   VD25OH 42.01 07/27/2021    No results found for: PREALBUMIN    Latest Ref Rng & Units 07/31/2024    4:14 AM 07/20/2024    2:26 PM 09/10/2023    9:31 AM  CBC EXTENDED  WBC 4.0 - 10.5 K/uL 11.4  6.5  4.9   RBC 3.87 - 5.11 MIL/uL 4.20  5.50  5.53   Hemoglobin 12.0 - 15.0 g/dL 89.0  85.8  85.8   HCT 36.0 - 46.0 % 34.2  44.6  44.9   Platelets 150 - 400 K/uL 217  283  261.0   NEUT# 1.4 - 7.7 K/uL   2.4   Lymph# 0.7 - 4.0 K/uL   1.9      There is no height or weight on file to calculate BMI.  Orders:  Orders Placed This Encounter  Procedures   XR Foot Complete Right   No orders of the defined types were placed in this encounter.    Procedures: No procedures performed  Clinical Data: No additional  findings.  ROS:  All other systems negative, except as noted in the HPI. Review of Systems  Objective: Vital Signs: There were no vitals taken for this visit.  Specialty Comments:  No specialty comments available.  PMFS History: Patient Active Problem List   Diagnosis Date Noted   Status post total replacement of right hip 07/30/2024   Status post left knee replacement 09/10/2022   IGT (impaired glucose tolerance) 07/27/2021   Preop testing 07/09/2018   Degenerative lumbar spinal stenosis 05/12/2018   Statin myopathy 08/29/2016   Iliotibial band syndrome of right side 04/23/2016   Acromioclavicular arthrosis 06/30/2015   Adhesive capsulitis of right shoulder 02/06/2015   Dysarthria due to cerebrovascular accident 01/09/2015   Spastic neurogenic bladder 10/31/2014   Right rotator cuff tendonitis 10/19/2014   Stenosis of cervical spine region    Cerebral infarction due to thrombosis of left middle cerebral artery (HCC)    Left pontine stroke (HCC)    TIA (transient ischemic attack) 10/14/2014   Overactive bladder 10/14/2014   H/O: CVA (cerebrovascular accident) 10/14/2014   Essential hypertension 10/10/2009   INSOMNIA 09/15/2009   Pure hypercholesterolemia 11/28/2008   MENOPAUSAL SYNDROME 11/28/2008   Past Medical History:  Diagnosis Date   Arthritis    Cervical spine degeneration    C5/C6   Chronic right shoulder pain    GERD (gastroesophageal reflux disease)    Hemiparesis affecting dominant side as late effect of cerebrovascular accident (HCC) 12/07/2014   Hyperlipemia    Hypertension    Menopausal syndrome    Teresa Martinez)   Overactive bladder    Statin intolerance    Stroke (HCC) 2015   weakness on right side   Varicose vein of leg    left leg    Family History  Problem Relation Age of Onset   Heart failure Father    Diabetes Mother    Hypertension Mother    Stroke Mother    Cirrhosis Sister    Diabetes Sister    Hyperlipidemia Brother     Hypertension Brother    Stroke Brother    Diabetes Brother     Past Surgical History:  Procedure Laterality Date   ABDOMINAL HYSTERECTOMY     1985   BAND HEMORRHOIDECTOMY     2010   CATARACT EXTRACTION, BILATERAL     CHOLECYSTECTOMY     1950   LUMBAR LAMINECTOMY Bilateral 08/03/2018   Dr.  Birkedal, L3 laminotomy, L4-L5 laminectomy with neural foraminal decompression.   PAROTIDECTOMY     30 years ago   TOTAL HIP ARTHROPLASTY Right 07/30/2024   Procedure: ARTHROPLASTY, HIP, TOTAL, ANTERIOR APPROACH;  Surgeon: Vernetta Lonni GRADE, MD;  Location: [facility];  Service: Orthopedics;  Laterality: Right;   TOTAL KNEE ARTHROPLASTY Left 09/10/2022   Procedure: LEFT TOTAL KNEE ARTHROPLASTY;  Surgeon: Vernetta Lonni GRADE, MD;  Location: [facility];  Service: Orthopedics;  Laterality: Left;   Social History   Occupational History   Occupation: retired  Tobacco Use   Smoking status: Never   Smokeless tobacco: Never  Vaping Use   Vaping status: Never Used  Substance and Sexual Activity   Alcohol  use: No   Drug use: No   Sexual activity: Not on file

## 2024-10-28 ENCOUNTER — Telehealth: Payer: Self-pay | Admitting: Pharmacy Technician

## 2024-10-28 NOTE — Telephone Encounter (Signed)
° °  Pharmacy Patient Advocate Encounter   Received notification from CoverMyMeds that prior authorization for nexlizet  is required/requested.   Insurance verification completed.   The patient is insured through [employer].   Pa

## 2024-12-11 ENCOUNTER — Other Ambulatory Visit: Payer: Self-pay | Admitting: Internal Medicine

## 2024-12-11 DIAGNOSIS — I10 Essential (primary) hypertension: Secondary | ICD-10-CM

## 2024-12-20 ENCOUNTER — Ambulatory Visit: Admitting: Orthopaedic Surgery

## 2024-12-29 ENCOUNTER — Ambulatory Visit: Admitting: Internal Medicine

## 2025-01-12 ENCOUNTER — Ambulatory Visit: Admitting: Orthopaedic Surgery
